# Patient Record
Sex: Female | Born: 1950
Health system: Southern US, Community
[De-identification: ages and names within clinical notes are randomized; demographics above are authoritative.]

## PROBLEM LIST (undated history)

## (undated) DIAGNOSIS — Z862 Personal history of diseases of the blood and blood-forming organs and certain disorders involving the immune mechanism: Secondary | ICD-10-CM

## (undated) DIAGNOSIS — M51369 Other intervertebral disc degeneration, lumbar region without mention of lumbar back pain or lower extremity pain: Secondary | ICD-10-CM

## (undated) DIAGNOSIS — H6690 Otitis media, unspecified, unspecified ear: Secondary | ICD-10-CM

## (undated) DIAGNOSIS — I10 Essential (primary) hypertension: Secondary | ICD-10-CM

## (undated) DIAGNOSIS — G629 Polyneuropathy, unspecified: Secondary | ICD-10-CM

## (undated) DIAGNOSIS — D509 Iron deficiency anemia, unspecified: Secondary | ICD-10-CM

## (undated) DIAGNOSIS — I671 Cerebral aneurysm, nonruptured: Secondary | ICD-10-CM

## (undated) DIAGNOSIS — A630 Anogenital (venereal) warts: Secondary | ICD-10-CM

## (undated) DIAGNOSIS — A6000 Herpesviral infection of urogenital system, unspecified: Secondary | ICD-10-CM

## (undated) DIAGNOSIS — G4700 Insomnia, unspecified: Secondary | ICD-10-CM

## (undated) DIAGNOSIS — M1990 Unspecified osteoarthritis, unspecified site: Secondary | ICD-10-CM

## (undated) DIAGNOSIS — E782 Mixed hyperlipidemia: Secondary | ICD-10-CM

## (undated) DIAGNOSIS — K219 Gastro-esophageal reflux disease without esophagitis: Secondary | ICD-10-CM

## (undated) DIAGNOSIS — M5136 Other intervertebral disc degeneration, lumbar region: Secondary | ICD-10-CM

## (undated) DIAGNOSIS — Z87448 Personal history of other diseases of urinary system: Secondary | ICD-10-CM

## (undated) DIAGNOSIS — I639 Cerebral infarction, unspecified: Secondary | ICD-10-CM

## (undated) DIAGNOSIS — E785 Hyperlipidemia, unspecified: Secondary | ICD-10-CM

## (undated) DIAGNOSIS — G20A1 Parkinson's disease without dyskinesia, without mention of fluctuations: Secondary | ICD-10-CM

## (undated) DIAGNOSIS — I509 Heart failure, unspecified: Secondary | ICD-10-CM

## (undated) DIAGNOSIS — D649 Anemia, unspecified: Secondary | ICD-10-CM

## (undated) DIAGNOSIS — C50919 Malignant neoplasm of unspecified site of unspecified female breast: Secondary | ICD-10-CM

## (undated) DIAGNOSIS — G20 Parkinson's disease: Secondary | ICD-10-CM

## (undated) DIAGNOSIS — J329 Chronic sinusitis, unspecified: Secondary | ICD-10-CM

## (undated) DIAGNOSIS — G250 Essential tremor: Secondary | ICD-10-CM

## (undated) HISTORY — PX: CHOLECYSTECTOMY: SHX55

## (undated) HISTORY — DX: Personal history of diseases of the blood and blood-forming organs and certain disorders involving the immune mechanism: Z86.2

## (undated) HISTORY — DX: Hyperlipidemia, unspecified: E78.5

## (undated) HISTORY — DX: Insomnia, unspecified: G47.00

## (undated) HISTORY — DX: Heart failure, unspecified: I50.9

## (undated) HISTORY — DX: Other intervertebral disc degeneration, lumbar region without mention of lumbar back pain or lower extremity pain: M51.369

## (undated) HISTORY — DX: Mixed hyperlipidemia: E78.2

## (undated) HISTORY — DX: Anogenital (venereal) warts: A63.0

## (undated) HISTORY — DX: Essential tremor: G25.0

## (undated) HISTORY — DX: Unspecified osteoarthritis, unspecified site: M19.90

## (undated) HISTORY — DX: Malignant neoplasm of unspecified site of unspecified female breast: C50.919

## (undated) HISTORY — DX: Polyneuropathy, unspecified: G62.9

## (undated) HISTORY — DX: Parkinson's disease without dyskinesia, without mention of fluctuations: G20.A1

## (undated) HISTORY — DX: Personal history of other diseases of urinary system: Z87.448

## (undated) HISTORY — DX: Cerebral aneurysm, nonruptured: I67.1

## (undated) HISTORY — DX: Cerebral infarction, unspecified: I63.9

## (undated) HISTORY — DX: Other intervertebral disc degeneration, lumbar region: M51.36

## (undated) HISTORY — DX: Iron deficiency anemia, unspecified: D50.9

## (undated) HISTORY — DX: Essential (primary) hypertension: I10

## (undated) HISTORY — DX: Parkinson's disease: G20

---
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
## 1968-01-22 DIAGNOSIS — D649 Anemia, unspecified: Secondary | ICD-10-CM

## 1968-01-22 HISTORY — PX: TONSILLECTOMY: SUR1361

## 1968-01-22 HISTORY — DX: Anemia, unspecified: D64.9

## 2004-10-25 ENCOUNTER — Ambulatory Visit: Payer: Self-pay | Admitting: Cardiology

## 2005-09-19 ENCOUNTER — Ambulatory Visit (HOSPITAL_COMMUNITY): Admission: RE | Admit: 2005-09-19 | Discharge: 2005-09-19 | Payer: Self-pay | Admitting: Family Medicine

## 2007-02-12 ENCOUNTER — Ambulatory Visit (HOSPITAL_COMMUNITY): Admission: RE | Admit: 2007-02-12 | Discharge: 2007-02-12 | Payer: Self-pay | Admitting: Family Medicine

## 2008-03-14 ENCOUNTER — Ambulatory Visit (HOSPITAL_COMMUNITY): Admission: RE | Admit: 2008-03-14 | Discharge: 2008-03-14 | Payer: Self-pay | Admitting: Family Medicine

## 2008-03-14 IMAGING — MG MM DIGITAL SCREENING
4 series · 4 of 4 positions shown · non-contrast
Comparison: none

DG SCREEN MAMMOGRAM BILATERAL
Bilateral CC and MLO view(s) were taken.

DIGITAL SCREENING MAMMOGRAM WITH CAD:
There are scattered fibroglandular densities.  No masses or malignant type calcifications are 
identified.  Compared with prior studies.

[L CC]
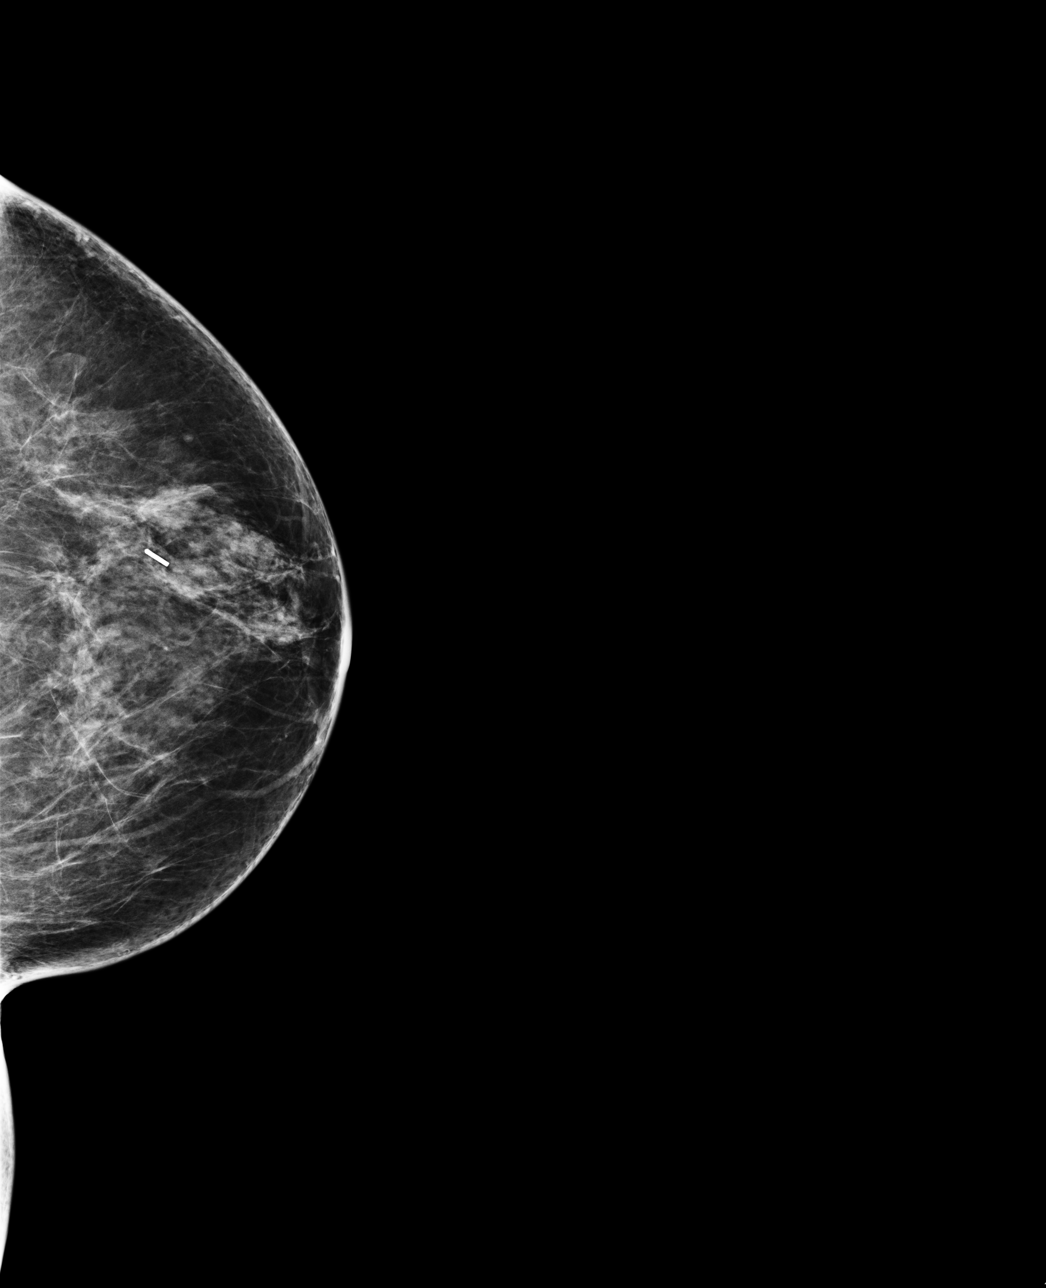

[L MLO]
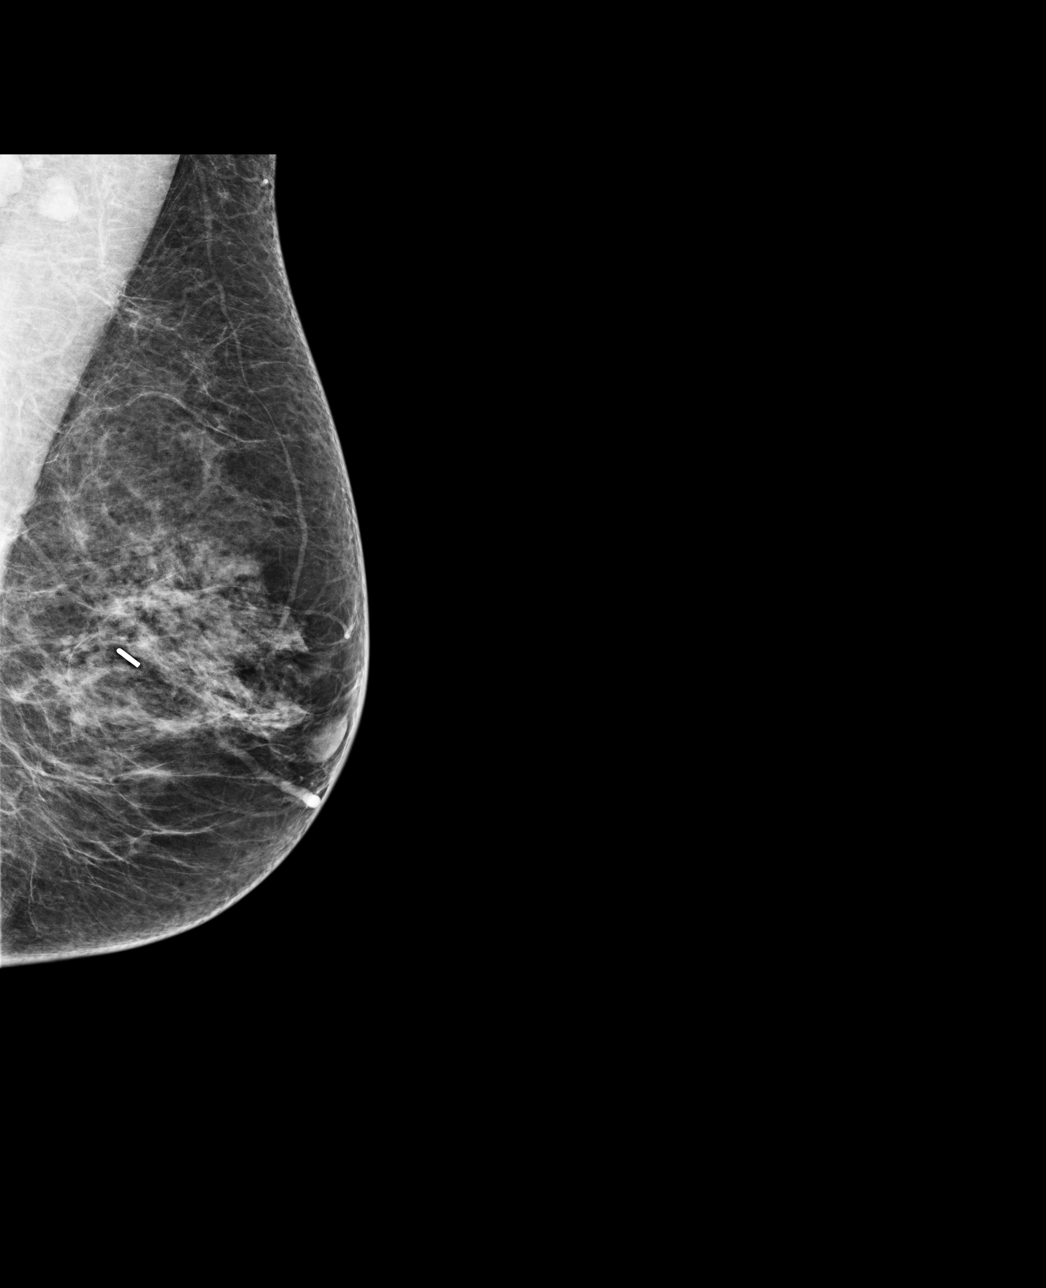

[R CC]
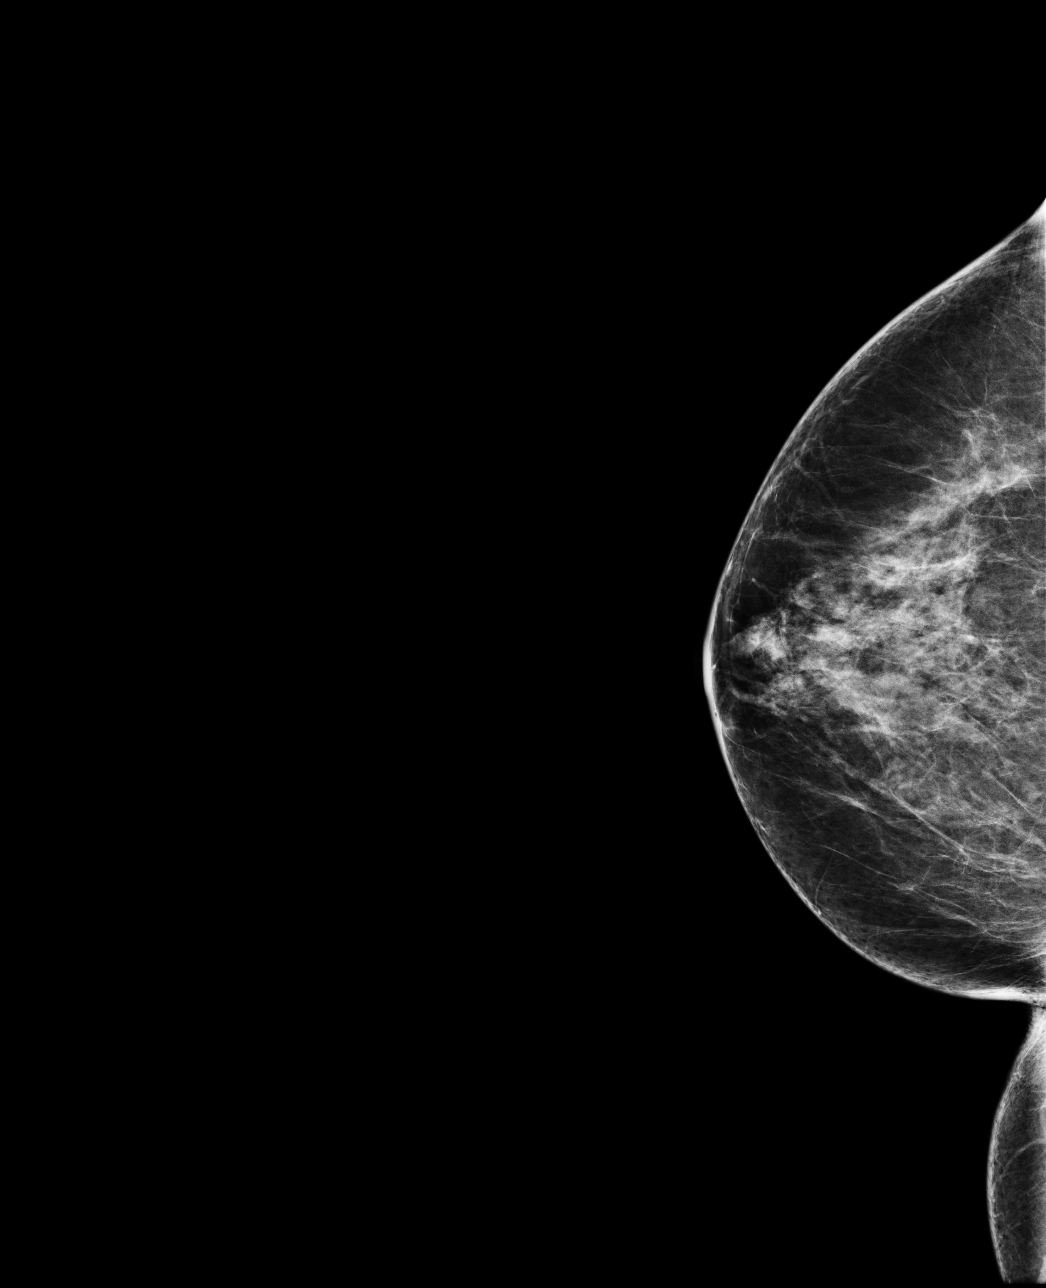

[R MLO]
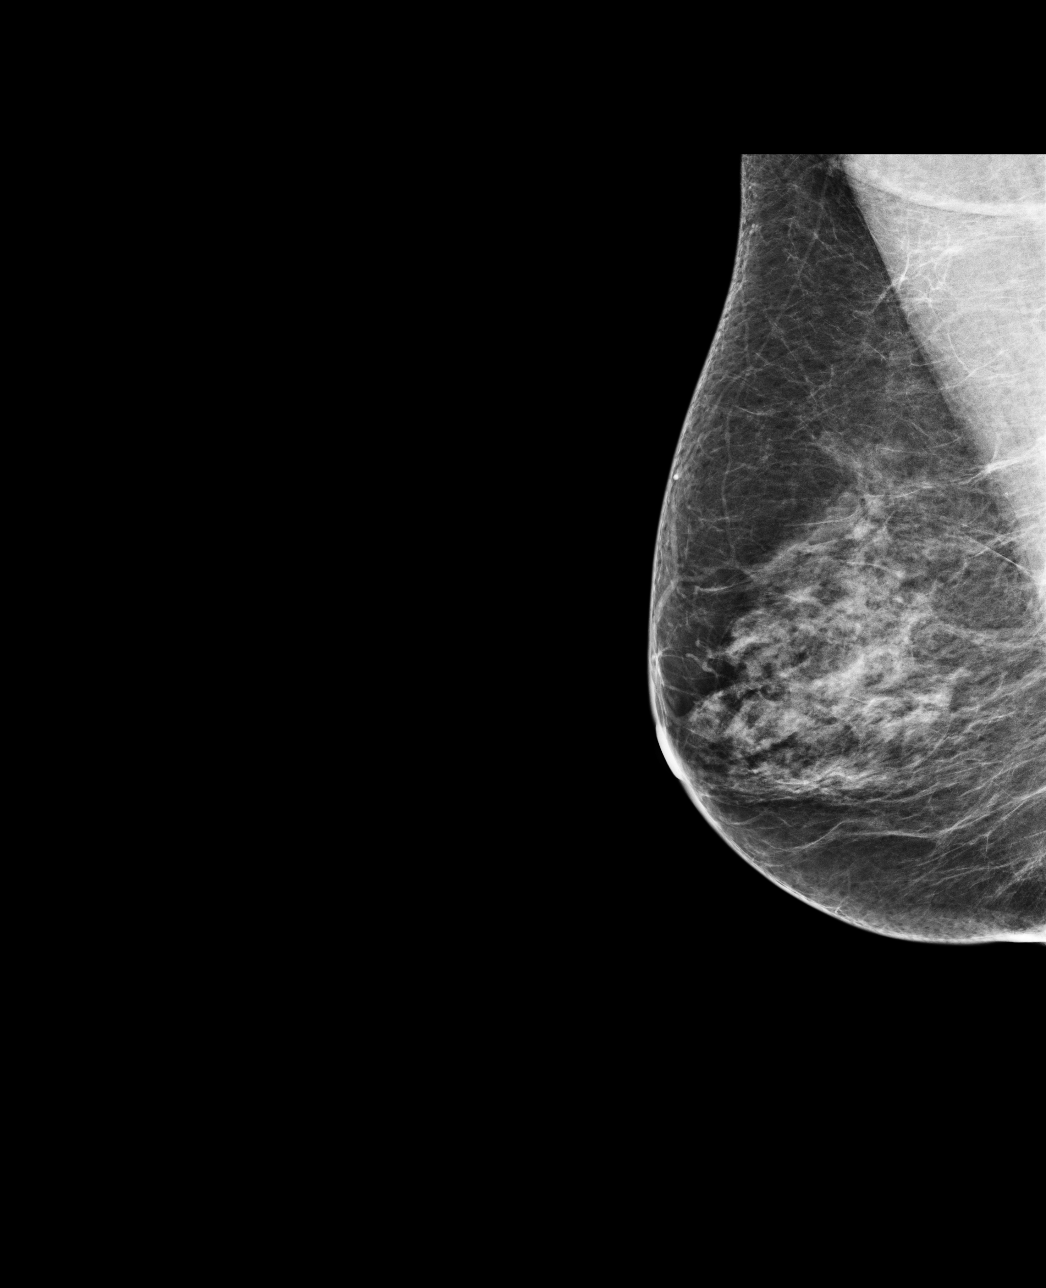

[4 of 4 positions shown; findings below may reference images not displayed]

IMPRESSION: No specific mammographic evidence of malignancy.  Next screening mammogram is recommended in one 
year.

ASSESSMENT: Negative - BI-RADS 1

Screening mammogram in 1 year.
THIS WAS ANALAYZED BY COMPUTER AIDED DETECTION. , THIS PROCEDURE WAS A DIGITAL MAMMOGRAM.

## 2013-04-12 DIAGNOSIS — M79609 Pain in unspecified limb: Secondary | ICD-10-CM | POA: Diagnosis not present

## 2013-04-12 DIAGNOSIS — M2010 Hallux valgus (acquired), unspecified foot: Secondary | ICD-10-CM | POA: Diagnosis not present

## 2013-04-12 DIAGNOSIS — M2020 Hallux rigidus, unspecified foot: Secondary | ICD-10-CM | POA: Diagnosis not present

## 2013-07-03 DIAGNOSIS — R109 Unspecified abdominal pain: Secondary | ICD-10-CM | POA: Diagnosis not present

## 2013-07-03 DIAGNOSIS — N200 Calculus of kidney: Secondary | ICD-10-CM | POA: Diagnosis not present

## 2013-07-21 DIAGNOSIS — H538 Other visual disturbances: Secondary | ICD-10-CM | POA: Diagnosis not present

## 2013-07-21 DIAGNOSIS — H25019 Cortical age-related cataract, unspecified eye: Secondary | ICD-10-CM | POA: Diagnosis not present

## 2013-07-21 DIAGNOSIS — H2510 Age-related nuclear cataract, unspecified eye: Secondary | ICD-10-CM | POA: Diagnosis not present

## 2013-08-02 DIAGNOSIS — D573 Sickle-cell trait: Secondary | ICD-10-CM | POA: Diagnosis not present

## 2013-08-02 DIAGNOSIS — H5210 Myopia, unspecified eye: Secondary | ICD-10-CM | POA: Diagnosis not present

## 2013-08-02 DIAGNOSIS — H25019 Cortical age-related cataract, unspecified eye: Secondary | ICD-10-CM | POA: Diagnosis not present

## 2013-08-02 DIAGNOSIS — K219 Gastro-esophageal reflux disease without esophagitis: Secondary | ICD-10-CM | POA: Diagnosis not present

## 2013-08-02 DIAGNOSIS — H269 Unspecified cataract: Secondary | ICD-10-CM | POA: Diagnosis not present

## 2013-08-02 DIAGNOSIS — I10 Essential (primary) hypertension: Secondary | ICD-10-CM | POA: Diagnosis not present

## 2013-08-02 DIAGNOSIS — Z880 Allergy status to penicillin: Secondary | ICD-10-CM | POA: Diagnosis not present

## 2013-08-02 DIAGNOSIS — H524 Presbyopia: Secondary | ICD-10-CM | POA: Diagnosis not present

## 2013-08-02 DIAGNOSIS — H538 Other visual disturbances: Secondary | ICD-10-CM | POA: Diagnosis not present

## 2013-08-02 DIAGNOSIS — H2510 Age-related nuclear cataract, unspecified eye: Secondary | ICD-10-CM | POA: Diagnosis not present

## 2013-08-02 DIAGNOSIS — Z79899 Other long term (current) drug therapy: Secondary | ICD-10-CM | POA: Diagnosis not present

## 2013-08-02 DIAGNOSIS — H52209 Unspecified astigmatism, unspecified eye: Secondary | ICD-10-CM | POA: Diagnosis not present

## 2013-08-13 DIAGNOSIS — E782 Mixed hyperlipidemia: Secondary | ICD-10-CM | POA: Diagnosis not present

## 2013-08-17 DIAGNOSIS — D509 Iron deficiency anemia, unspecified: Secondary | ICD-10-CM | POA: Diagnosis not present

## 2013-08-17 DIAGNOSIS — Z0000 Encounter for general adult medical examination without abnormal findings: Secondary | ICD-10-CM | POA: Diagnosis not present

## 2013-08-17 DIAGNOSIS — I10 Essential (primary) hypertension: Secondary | ICD-10-CM | POA: Diagnosis not present

## 2013-08-17 DIAGNOSIS — Z1331 Encounter for screening for depression: Secondary | ICD-10-CM | POA: Diagnosis not present

## 2013-08-17 DIAGNOSIS — K219 Gastro-esophageal reflux disease without esophagitis: Secondary | ICD-10-CM | POA: Diagnosis not present

## 2013-08-17 DIAGNOSIS — J309 Allergic rhinitis, unspecified: Secondary | ICD-10-CM | POA: Diagnosis not present

## 2013-08-17 DIAGNOSIS — E559 Vitamin D deficiency, unspecified: Secondary | ICD-10-CM | POA: Diagnosis not present

## 2013-08-17 DIAGNOSIS — E782 Mixed hyperlipidemia: Secondary | ICD-10-CM | POA: Diagnosis not present

## 2013-08-26 DIAGNOSIS — R300 Dysuria: Secondary | ICD-10-CM | POA: Diagnosis not present

## 2013-10-28 DIAGNOSIS — M7062 Trochanteric bursitis, left hip: Secondary | ICD-10-CM | POA: Diagnosis not present

## 2013-10-28 DIAGNOSIS — A6000 Herpesviral infection of urogenital system, unspecified: Secondary | ICD-10-CM | POA: Diagnosis not present

## 2013-10-28 DIAGNOSIS — I10 Essential (primary) hypertension: Secondary | ICD-10-CM | POA: Diagnosis not present

## 2013-10-28 DIAGNOSIS — J302 Other seasonal allergic rhinitis: Secondary | ICD-10-CM | POA: Diagnosis not present

## 2013-10-28 DIAGNOSIS — E559 Vitamin D deficiency, unspecified: Secondary | ICD-10-CM | POA: Diagnosis not present

## 2013-10-28 DIAGNOSIS — E782 Mixed hyperlipidemia: Secondary | ICD-10-CM | POA: Diagnosis not present

## 2013-10-28 DIAGNOSIS — K649 Unspecified hemorrhoids: Secondary | ICD-10-CM | POA: Diagnosis not present

## 2013-10-28 DIAGNOSIS — D509 Iron deficiency anemia, unspecified: Secondary | ICD-10-CM | POA: Diagnosis not present

## 2013-12-31 DIAGNOSIS — J019 Acute sinusitis, unspecified: Secondary | ICD-10-CM | POA: Diagnosis not present

## 2013-12-31 DIAGNOSIS — I10 Essential (primary) hypertension: Secondary | ICD-10-CM | POA: Diagnosis not present

## 2014-01-03 NOTE — Patient Instructions (Signed)
Your procedure is scheduled on: 01/10/2014  Report to [facility] at  1000  AM.  Call this number if you have problems the morning of surgery: [phone]   Do not eat food or drink liquids :After Midnight.      Take these medicines the morning of surgery with A SIP OF WATER: none   Do not wear jewelry, make-up or nail polish.  Do not wear lotions, powders, or perfumes.   Do not shave 48 hours prior to surgery.  Do not bring valuables to the hospital.  Contacts, dentures or bridgework may not be worn into surgery.  Leave suitcase in the car. After surgery it may be brought to your room.  For patients admitted to the hospital, checkout time is 11:00 AM the day of discharge.   Patients discharged the day of surgery will not be allowed to drive home.  :     Please read over the following fact sheets that you were given: Coughing and Deep Breathing, Surgical Site Infection Prevention, Anesthesia Post-op Instructions and Care and Recovery After Surgery    Cataract A cataract is a clouding of the lens of the eye. When a lens becomes cloudy, vision is reduced based on the degree and nature of the clouding. Many cataracts reduce vision to some degree. Some cataracts make people more near-sighted as they develop. Other cataracts increase glare. Cataracts that are ignored and become worse can sometimes look white. The white color can be seen through the pupil. CAUSES   Aging. However, cataracts may occur at any age, even in newborns.   Certain drugs.   Trauma to the eye.   Certain diseases such as diabetes.   Specific eye diseases such as chronic inflammation inside the eye or a sudden attack of a rare form of glaucoma.   Inherited or acquired medical problems.  SYMPTOMS   Gradual, progressive drop in vision in the affected eye.   Severe, rapid visual loss. This most often happens when trauma is the cause.  DIAGNOSIS  To detect a cataract, an eye doctor examines the lens. Cataracts are  best diagnosed with an exam of the eyes with the pupils enlarged (dilated) by drops.  TREATMENT  For an early cataract, vision may improve by using different eyeglasses or stronger lighting. If that does not help your vision, surgery is the only effective treatment. A cataract needs to be surgically removed when vision loss interferes with your everyday activities, such as driving, reading, or watching TV. A cataract may also have to be removed if it prevents examination or treatment of another eye problem. Surgery removes the cloudy lens and usually replaces it with a substitute lens (intraocular lens, IOL).  At a time when both you and your doctor agree, the cataract will be surgically removed. If you have cataracts in both eyes, only one is usually removed at a time. This allows the operated eye to heal and be out of danger from any possible problems after surgery (such as infection or poor wound healing). In rare cases, a cataract may be doing damage to your eye. In these cases, your caregiver may advise surgical removal right away. The vast majority of people who have cataract surgery have better vision afterward. HOME CARE INSTRUCTIONS  If you are not planning surgery, you may be asked to do the following:  Use different eyeglasses.   Use stronger or brighter lighting.   Ask your eye doctor about reducing your medicine dose or changing medicines if  it is thought that a medicine caused your cataract. Changing medicines does not make the cataract go away on its own.   Become familiar with your surroundings. Poor vision can lead to injury. Avoid bumping into things on the affected side. You are at a higher risk for tripping or falling.   Exercise extreme care when driving or operating machinery.   Wear sunglasses if you are sensitive to bright light or experiencing problems with glare.  SEEK IMMEDIATE MEDICAL CARE IF:   You have a worsening or sudden vision loss.   You notice redness,  swelling, or increasing pain in the eye.   You have a fever.  Document Released: 01/07/2005 Document Revised: 12/27/2010 Document Reviewed: 08/31/2010 [facility] Patient Information 2012 [place].PATIENT INSTRUCTIONS POST-ANESTHESIA  IMMEDIATELY FOLLOWING SURGERY:  Do not drive or operate machinery for the first twenty four hours after surgery.  Do not make any important decisions for twenty four hours after surgery or while taking narcotic pain medications or sedatives.  If you develop intractable nausea and vomiting or a severe headache please notify your doctor immediately.  FOLLOW-UP:  Please make an appointment with your surgeon as instructed. You do not need to follow up with anesthesia unless specifically instructed to do so.  WOUND CARE INSTRUCTIONS (if applicable):  Keep a dry clean dressing on the anesthesia/puncture wound site if there is drainage.  Once the wound has quit draining you may leave it open to air.  Generally you should leave the bandage intact for twenty four hours unless there is drainage.  If the epidural site drains for more than 36-48 hours please call the anesthesia department.  QUESTIONS?:  Please feel free to call your physician or the hospital operator if you have any questions, and they will be happy to assist you.

## 2014-01-04 ENCOUNTER — Encounter (HOSPITAL_COMMUNITY): Payer: Self-pay

## 2014-01-04 ENCOUNTER — Encounter (HOSPITAL_COMMUNITY)
Admission: RE | Admit: 2014-01-04 | Discharge: 2014-01-04 | Disposition: A | Discharge status: Home / Self Care | Payer: Managed Care, Other (non HMO) | Source: Ambulatory Visit | Attending: Ophthalmology | Admitting: Ophthalmology

## 2014-01-04 ENCOUNTER — Other Ambulatory Visit: Payer: Self-pay

## 2014-01-04 DIAGNOSIS — Z01818 Encounter for other preprocedural examination: Secondary | ICD-10-CM | POA: Diagnosis not present

## 2014-01-04 DIAGNOSIS — J329 Chronic sinusitis, unspecified: Secondary | ICD-10-CM

## 2014-01-04 HISTORY — DX: Herpesviral infection of urogenital system, unspecified: A60.00

## 2014-01-04 HISTORY — DX: Chronic sinusitis, unspecified: J32.9

## 2014-01-04 HISTORY — DX: Gastro-esophageal reflux disease without esophagitis: K21.9

## 2014-01-04 LAB — BASIC METABOLIC PANEL
ANION GAP: 13 (ref 5–15)
BUN: 10 mg/dL (ref 6–23)
CHLORIDE: 104 meq/L (ref 96–112)
CO2: 27 meq/L (ref 19–32)
Calcium: 9.3 mg/dL (ref 8.4–10.5)
Creatinine, Ser: 1.04 mg/dL (ref 0.50–1.10)
GFR calc Af Amer: 64 mL/min — ABNORMAL LOW (ref >90)
GFR calc non Af Amer: 55 mL/min — ABNORMAL LOW (ref >90)
Glucose, Bld: 95 mg/dL (ref 70–99)
POTASSIUM: 4.2 meq/L (ref 3.7–5.3)
SODIUM: 144 meq/L (ref 137–147)

## 2014-01-04 LAB — HEMOGLOBIN AND HEMATOCRIT, BLOOD
HEMATOCRIT: 34.1 % — AB (ref 36.0–46.0)
Hemoglobin: 11.5 g/dL — ABNORMAL LOW (ref 12.0–15.0)

## 2014-01-10 ENCOUNTER — Encounter (HOSPITAL_COMMUNITY)
Admission: RE | Disposition: A | Discharge status: Home / Self Care | Payer: Self-pay | Source: Ambulatory Visit | Attending: Ophthalmology

## 2014-01-10 ENCOUNTER — Ambulatory Visit (HOSPITAL_COMMUNITY)
Admission: RE | Admit: 2014-01-10 | Discharge: 2014-01-10 | Disposition: A | Discharge status: Home / Self Care | Payer: Private Health Insurance - Indemnity | Source: Ambulatory Visit | Attending: Ophthalmology | Admitting: Ophthalmology

## 2014-01-10 ENCOUNTER — Ambulatory Visit (HOSPITAL_COMMUNITY): Payer: Private Health Insurance - Indemnity | Admitting: Anesthesiology

## 2014-01-10 DIAGNOSIS — K219 Gastro-esophageal reflux disease without esophagitis: Secondary | ICD-10-CM | POA: Diagnosis not present

## 2014-01-10 DIAGNOSIS — H2512 Age-related nuclear cataract, left eye: Secondary | ICD-10-CM | POA: Diagnosis not present

## 2014-01-10 HISTORY — PX: CATARACT EXTRACTION W/PHACO: SHX586

## 2014-01-10 SURGERY — PHACOEMULSIFICATION, CATARACT, WITH IOL INSERTION
Anesthesia: Monitor Anesthesia Care | Site: Eye | Laterality: Left

## 2014-01-10 MED ORDER — LIDOCAINE HCL 3.5 % OP GEL
OPHTHALMIC | Status: AC
  Filled 2014-01-10: qty 1

## 2014-01-10 MED ORDER — LIDOCAINE HCL 3.5 % OP GEL
OPHTHALMIC | Status: DC | PRN
  Administered 2014-01-10: 1 via OPHTHALMIC

## 2014-01-10 MED ORDER — MIDAZOLAM HCL 2 MG/2ML IJ SOLN
1.0000 mg | INTRAMUSCULAR | Status: DC | PRN
  Administered 2014-01-10: 2 mg via INTRAVENOUS

## 2014-01-10 MED ORDER — FENTANYL CITRATE 0.05 MG/ML IJ SOLN
25.0000 ug | INTRAMUSCULAR | Status: AC
  Administered 2014-01-10 (×2): 25 ug via INTRAVENOUS

## 2014-01-10 MED ORDER — LACTATED RINGERS IV SOLN
INTRAVENOUS | Status: DC
  Administered 2014-01-10: 10:00:00 via INTRAVENOUS

## 2014-01-10 MED ORDER — TETRACAINE HCL 0.5 % OP SOLN
OPHTHALMIC | Status: AC
  Filled 2014-01-10: qty 2

## 2014-01-10 MED ORDER — BSS IO SOLN
INTRAOCULAR | Status: DC | PRN
  Administered 2014-01-10: 15 mL via INTRAOCULAR

## 2014-01-10 MED ORDER — MIDAZOLAM HCL 2 MG/2ML IJ SOLN
INTRAMUSCULAR | Status: AC
  Filled 2014-01-10: qty 2

## 2014-01-10 MED ORDER — EPINEPHRINE HCL 1 MG/ML IJ SOLN
INTRAOCULAR | Status: DC | PRN
  Administered 2014-01-10: 500 mL

## 2014-01-10 MED ORDER — TETRACAINE 0.5 % OP SOLN OPTIME - NO CHARGE
OPHTHALMIC | Status: DC | PRN
  Administered 2014-01-10: 1 [drp] via OPHTHALMIC

## 2014-01-10 MED ORDER — FENTANYL CITRATE 0.05 MG/ML IJ SOLN
INTRAMUSCULAR | Status: AC
  Filled 2014-01-10: qty 2

## 2014-01-10 MED ORDER — POVIDONE-IODINE 5 % OP SOLN
OPHTHALMIC | Status: DC | PRN
  Administered 2014-01-10: 1 via OPHTHALMIC

## 2014-01-10 MED ORDER — NA HYALUR & NA CHOND-NA HYALUR 0.55-0.5 ML IO KIT
PACK | INTRAOCULAR | Status: DC | PRN
  Administered 2014-01-10: 1 via OPHTHALMIC

## 2014-01-10 MED ORDER — CYCLOPENTOLATE-PHENYLEPHRINE OP SOLN OPTIME - NO CHARGE
OPHTHALMIC | Status: AC
  Filled 2014-01-10: qty 2

## 2014-01-10 MED ORDER — CYCLOPENTOLATE-PHENYLEPHRINE 0.2-1 % OP SOLN
1.0000 [drp] | OPHTHALMIC | Status: AC
  Administered 2014-01-10 (×3): 1 [drp] via OPHTHALMIC

## 2014-01-10 MED ORDER — TETRACAINE HCL 0.5 % OP SOLN
1.0000 [drp] | OPHTHALMIC | Status: AC
Start: 2014-01-10 — End: 2014-01-10
  Administered 2014-01-10 (×3): 1 [drp] via OPHTHALMIC

## 2014-01-10 MED ORDER — EPINEPHRINE HCL 1 MG/ML IJ SOLN
INTRAMUSCULAR | Status: AC
  Filled 2014-01-10: qty 1

## 2014-01-10 MED ORDER — LIDOCAINE HCL 3.5 % OP GEL: 1.0000 "application " | Freq: Once | OPHTHALMIC | Status: DC

## 2014-01-10 SURGICAL SUPPLY — 28 items
CAPSULAR TENSION RING-AMO (OPHTHALMIC RELATED) IMPLANT
CLOTH BEACON ORANGE TIMEOUT ST (SAFETY) ×2 IMPLANT
GLOVE BIO SURGEON STRL SZ7.5 (GLOVE) IMPLANT
GLOVE BIOGEL M 6.5 STRL (GLOVE) IMPLANT
GLOVE BIOGEL PI IND STRL 6.5 (GLOVE) IMPLANT
GLOVE BIOGEL PI IND STRL 7.0 (GLOVE) IMPLANT
GLOVE BIOGEL PI INDICATOR 6.5 (GLOVE) ×2
GLOVE BIOGEL PI INDICATOR 7.0 (GLOVE)
GLOVE ECLIPSE 6.5 STRL STRAW (GLOVE) IMPLANT
GLOVE ECLIPSE 7.5 STRL STRAW (GLOVE) IMPLANT
GLOVE EXAM NITRILE LRG STRL (GLOVE) IMPLANT
GLOVE EXAM NITRILE MD LF STRL (GLOVE) ×2 IMPLANT
GLOVE SKINSENSE NS SZ6.5 (GLOVE)
GLOVE SKINSENSE NS SZ7.0 (GLOVE)
GLOVE SKINSENSE STRL SZ6.5 (GLOVE) IMPLANT
GLOVE SKINSENSE STRL SZ7.0 (GLOVE) IMPLANT
INST SET CATARACT ~~LOC~~ (KITS) ×3 IMPLANT
KIT VITRECTOMY (OPHTHALMIC RELATED) IMPLANT
PAD ARMBOARD 7.5X6 YLW CONV (MISCELLANEOUS) ×2 IMPLANT
PROC W NO LENS (INTRAOCULAR LENS)
PROC W SPEC LENS (INTRAOCULAR LENS)
PROCESS W NO LENS (INTRAOCULAR LENS) IMPLANT
PROCESS W SPEC LENS (INTRAOCULAR LENS) IMPLANT
RETRACTOR IRIS SIGHTPATH (OPHTHALMIC RELATED) IMPLANT
RING MALYGIN (MISCELLANEOUS) IMPLANT
SIGHTPATH CAT PROC W REG LENS (Ophthalmic Related) ×3 IMPLANT
VISCOELASTIC ADDITIONAL (OPHTHALMIC RELATED) IMPLANT
WATER STERILE IRR 250ML POUR (IV SOLUTION) ×2 IMPLANT

## 2014-01-10 NOTE — Discharge Instructions (Signed)
BRILYNN BIASI 01/10/2014 Dr. Iona Hansen Post operative Instructions for Cataract Patients  These instructions are for Kimberly Mccormick and pertain to the operative eye.  1.  Resume your normal diet and previous oral medicines.  2. Your Follow-up appointment is at Dr. Iona Hansen' office in [place] on 01/11/14 at 2:10 pm.  3. You may leave the hospital when your driver is present and your nurse releases you.  4. Begin Pred Forte (prednisolone acetate 1%), Acular LS (ketorolac tromethamine .4%) and Gatifloxacin 0.5% eye drops; 1 drop each 4 times daily to operative eye. Begin 3 hours after discharge from Short Stay Unit.  Moxifloxacin 0.5% may be substituted for Gatifloxacin using the same instructions.  67. Page Dr. Iona Hansen via beeper [phone] for significant pain in or around operative eye that is not relieved by Tylenol.  6. If you took Plavix before surgery, restart it at the usual dose on the evening of surgery.  7. Wear dark glasses as necessary for excessive light sensitivity.  8. Do no forcefully rub you your operative eye.  9. Keep your operative eye dry for 1 week. You may gently clean your eyelids with a damp washcloth.  10. You may resume normal occupational activities in one week and resume driving as tolerated after the first post operative visit.  11. It is normal to have blurred vision and a scratchy sensation following surgery.  Dr. Iona Hansen: [phone]

## 2014-01-10 NOTE — Op Note (Signed)
See scanned op note 

## 2014-01-10 NOTE — Anesthesia Postprocedure Evaluation (Signed)
  Anesthesia Post-op Note  Patient: Kimberly Mccormick  Procedure(s) Performed: Procedure(s): CATARACT EXTRACTION PHACO AND INTRAOCULAR LENS PLACEMENT ; CDE:  4.94 (Left)  Patient Location: Short Stay  Anesthesia Type:MAC  Level of Consciousness: awake, alert , oriented and patient cooperative  Airway and Oxygen Therapy: Patient Spontanous Breathing  Post-op Pain: none  Post-op Assessment: Post-op Vital signs reviewed, Patient's Cardiovascular Status Stable, Respiratory Function Stable and Patent Airway  Post-op Vital Signs: Reviewed and stable  Last Vitals:  Filed Vitals:   01/10/14 1045  BP: 124/76  Pulse:   Temp:   Resp: 28    Complications: No apparent anesthesia complications

## 2014-01-10 NOTE — Brief Op Note (Signed)
01/10/2014  11:33 AM  PATIENT:  Kimberly Mccormick  63 y.o. female  PRE-OPERATIVE DIAGNOSIS:  nuclear cataract left eye  POST-OPERATIVE DIAGNOSIS:  nuclear cataract left eye  PROCEDURE:  Procedure(s): CATARACT EXTRACTION PHACO AND INTRAOCULAR LENS PLACEMENT ; CDE:  4.94  SURGEON:  Surgeon(s): Williams Che, MD  ASSISTANTS: Bonney Roussel, CST   ANESTHESIA STAFF: Anesthesiologist: Lerry Liner, MD CRNA: Charmaine Downs, CRNA  ANESTHESIA:   topical and MAC  REQUESTED LENS POWER: 21.5  LENS IMPLANT INFORMATION:  [employer] [id number] s/n [id number]  Exp 06/2018  CUMULATIVE DISSIPATED ENERGY:4.94  INDICATIONS:see office H&P  OP FINDINGS:mod. dense NS  COMPLICATIONS:None  DICTATION #: none  PLAN OF CARE: as above  PATIENT DISPOSITION:  Short Stay

## 2014-01-10 NOTE — Transfer of Care (Signed)
Immediate Anesthesia Transfer of Care Note  Patient: Kimberly Mccormick  Procedure(s) Performed: Procedure(s): CATARACT EXTRACTION PHACO AND INTRAOCULAR LENS PLACEMENT ; CDE:  4.94 (Left)  Patient Location: Short Stay  Anesthesia Type:MAC  Level of Consciousness: awake, alert , oriented and patient cooperative  Airway & Oxygen Therapy: Patient Spontanous Breathing  Post-op Assessment: Report given to PACU RN, Post -op Vital signs reviewed and stable and Patient moving all extremities  Post vital signs: Reviewed and stable  Complications: No apparent anesthesia complications

## 2014-01-10 NOTE — Anesthesia Preprocedure Evaluation (Signed)
Anesthesia Evaluation  Patient identified by MRN, date of birth, ID band Patient awake    Reviewed: Allergy & Precautions, H&P , NPO status , Patient's Chart, lab work & pertinent test results  Airway Mallampati: I  TM Distance: >3 FB     Dental  (+) Edentulous Upper, Edentulous Lower   Pulmonary neg pulmonary ROS,  breath sounds clear to auscultation        Cardiovascular negative cardio ROS  Rhythm:Regular Rate:Normal     Neuro/Psych    GI/Hepatic GERD-  ,  Endo/Other    Renal/GU      Musculoskeletal   Abdominal   Peds  Hematology   Anesthesia Other Findings   Reproductive/Obstetrics                             Anesthesia Physical Anesthesia Plan  ASA: II  Anesthesia Plan: MAC   Post-op Pain Management:    Induction: Intravenous  Airway Management Planned: Nasal Cannula  Additional Equipment:   Intra-op Plan:   Post-operative Plan:   Informed Consent: I have reviewed the patients History and Physical, chart, labs and discussed the procedure including the risks, benefits and alternatives for the proposed anesthesia with the patient or authorized representative who has indicated his/her understanding and acceptance.     Plan Discussed with:   Anesthesia Plan Comments:         Anesthesia Quick Evaluation

## 2014-01-10 NOTE — H&P (Signed)
I have reviewed the pre printed H&P, the patient was re-examined, and I have identified no significant interval changes in the patient's medical condition.  There is no change in the plan of care since the history and physical of record. 

## 2014-01-11 ENCOUNTER — Encounter (HOSPITAL_COMMUNITY): Payer: Self-pay | Admitting: Ophthalmology

## 2014-01-21 HISTORY — PX: NASAL SINUS SURGERY: SHX719

## 2014-01-31 DIAGNOSIS — Z1231 Encounter for screening mammogram for malignant neoplasm of breast: Secondary | ICD-10-CM | POA: Diagnosis not present

## 2014-02-05 DIAGNOSIS — E559 Vitamin D deficiency, unspecified: Secondary | ICD-10-CM | POA: Diagnosis not present

## 2014-02-05 DIAGNOSIS — E782 Mixed hyperlipidemia: Secondary | ICD-10-CM | POA: Diagnosis not present

## 2014-02-05 DIAGNOSIS — I10 Essential (primary) hypertension: Secondary | ICD-10-CM | POA: Diagnosis not present

## 2014-02-09 DIAGNOSIS — M1991 Primary osteoarthritis, unspecified site: Secondary | ICD-10-CM | POA: Diagnosis not present

## 2014-02-09 DIAGNOSIS — A6000 Herpesviral infection of urogenital system, unspecified: Secondary | ICD-10-CM | POA: Diagnosis not present

## 2014-02-09 DIAGNOSIS — K649 Unspecified hemorrhoids: Secondary | ICD-10-CM | POA: Diagnosis not present

## 2014-02-09 DIAGNOSIS — I10 Essential (primary) hypertension: Secondary | ICD-10-CM | POA: Diagnosis not present

## 2014-02-09 DIAGNOSIS — J302 Other seasonal allergic rhinitis: Secondary | ICD-10-CM | POA: Diagnosis not present

## 2014-02-09 DIAGNOSIS — J329 Chronic sinusitis, unspecified: Secondary | ICD-10-CM | POA: Diagnosis not present

## 2014-02-09 DIAGNOSIS — M7062 Trochanteric bursitis, left hip: Secondary | ICD-10-CM | POA: Diagnosis not present

## 2014-02-09 DIAGNOSIS — E559 Vitamin D deficiency, unspecified: Secondary | ICD-10-CM | POA: Diagnosis not present

## 2014-02-09 DIAGNOSIS — E782 Mixed hyperlipidemia: Secondary | ICD-10-CM | POA: Diagnosis not present

## 2014-02-09 DIAGNOSIS — Z6821 Body mass index (BMI) 21.0-21.9, adult: Secondary | ICD-10-CM | POA: Diagnosis not present

## 2014-02-09 DIAGNOSIS — J330 Polyp of nasal cavity: Secondary | ICD-10-CM | POA: Diagnosis not present

## 2014-02-09 DIAGNOSIS — K219 Gastro-esophageal reflux disease without esophagitis: Secondary | ICD-10-CM | POA: Diagnosis not present

## 2014-03-01 DIAGNOSIS — J339 Nasal polyp, unspecified: Secondary | ICD-10-CM | POA: Diagnosis not present

## 2014-03-01 DIAGNOSIS — J329 Chronic sinusitis, unspecified: Secondary | ICD-10-CM | POA: Diagnosis not present

## 2014-03-01 DIAGNOSIS — H6690 Otitis media, unspecified, unspecified ear: Secondary | ICD-10-CM | POA: Diagnosis not present

## 2014-03-04 DIAGNOSIS — J3489 Other specified disorders of nose and nasal sinuses: Secondary | ICD-10-CM | POA: Diagnosis not present

## 2014-03-04 DIAGNOSIS — J339 Nasal polyp, unspecified: Secondary | ICD-10-CM | POA: Diagnosis not present

## 2014-03-04 DIAGNOSIS — R938 Abnormal findings on diagnostic imaging of other specified body structures: Secondary | ICD-10-CM | POA: Diagnosis not present

## 2014-04-05 DIAGNOSIS — H6690 Otitis media, unspecified, unspecified ear: Secondary | ICD-10-CM | POA: Diagnosis not present

## 2014-04-05 DIAGNOSIS — J329 Chronic sinusitis, unspecified: Secondary | ICD-10-CM | POA: Diagnosis not present

## 2014-04-05 DIAGNOSIS — J339 Nasal polyp, unspecified: Secondary | ICD-10-CM | POA: Diagnosis not present

## 2014-04-26 DIAGNOSIS — Z79899 Other long term (current) drug therapy: Secondary | ICD-10-CM | POA: Diagnosis not present

## 2014-04-26 DIAGNOSIS — K219 Gastro-esophageal reflux disease without esophagitis: Secondary | ICD-10-CM | POA: Diagnosis not present

## 2014-04-26 DIAGNOSIS — J329 Chronic sinusitis, unspecified: Secondary | ICD-10-CM | POA: Diagnosis not present

## 2014-04-26 DIAGNOSIS — J328 Other chronic sinusitis: Secondary | ICD-10-CM | POA: Diagnosis not present

## 2014-04-26 DIAGNOSIS — H6593 Unspecified nonsuppurative otitis media, bilateral: Secondary | ICD-10-CM | POA: Diagnosis not present

## 2014-04-26 DIAGNOSIS — D573 Sickle-cell trait: Secondary | ICD-10-CM | POA: Diagnosis not present

## 2014-04-26 DIAGNOSIS — H900 Conductive hearing loss, bilateral: Secondary | ICD-10-CM | POA: Diagnosis not present

## 2014-04-26 DIAGNOSIS — J339 Nasal polyp, unspecified: Secondary | ICD-10-CM | POA: Diagnosis not present

## 2014-04-26 DIAGNOSIS — Z880 Allergy status to penicillin: Secondary | ICD-10-CM | POA: Diagnosis not present

## 2014-04-26 DIAGNOSIS — I10 Essential (primary) hypertension: Secondary | ICD-10-CM | POA: Diagnosis not present

## 2014-05-03 DIAGNOSIS — H906 Mixed conductive and sensorineural hearing loss, bilateral: Secondary | ICD-10-CM | POA: Diagnosis not present

## 2014-05-03 DIAGNOSIS — J339 Nasal polyp, unspecified: Secondary | ICD-10-CM | POA: Diagnosis not present

## 2014-05-31 DIAGNOSIS — J339 Nasal polyp, unspecified: Secondary | ICD-10-CM | POA: Diagnosis not present

## 2014-05-31 DIAGNOSIS — H906 Mixed conductive and sensorineural hearing loss, bilateral: Secondary | ICD-10-CM | POA: Diagnosis not present

## 2014-06-14 DIAGNOSIS — Z6822 Body mass index (BMI) 22.0-22.9, adult: Secondary | ICD-10-CM | POA: Diagnosis not present

## 2014-06-14 DIAGNOSIS — L03114 Cellulitis of left upper limb: Secondary | ICD-10-CM | POA: Diagnosis not present

## 2014-06-17 DIAGNOSIS — Z79899 Other long term (current) drug therapy: Secondary | ICD-10-CM | POA: Diagnosis not present

## 2014-06-17 DIAGNOSIS — I10 Essential (primary) hypertension: Secondary | ICD-10-CM | POA: Diagnosis not present

## 2014-06-17 DIAGNOSIS — K219 Gastro-esophageal reflux disease without esophagitis: Secondary | ICD-10-CM | POA: Diagnosis not present

## 2014-06-17 DIAGNOSIS — R079 Chest pain, unspecified: Secondary | ICD-10-CM | POA: Diagnosis not present

## 2014-06-17 DIAGNOSIS — Z79891 Long term (current) use of opiate analgesic: Secondary | ICD-10-CM | POA: Diagnosis not present

## 2014-06-17 DIAGNOSIS — Z880 Allergy status to penicillin: Secondary | ICD-10-CM | POA: Diagnosis not present

## 2014-06-17 DIAGNOSIS — E785 Hyperlipidemia, unspecified: Secondary | ICD-10-CM | POA: Diagnosis not present

## 2014-06-23 DIAGNOSIS — R1084 Generalized abdominal pain: Secondary | ICD-10-CM | POA: Diagnosis not present

## 2014-06-24 ENCOUNTER — Encounter (HOSPITAL_COMMUNITY): Payer: Self-pay | Admitting: *Deleted

## 2014-06-24 ENCOUNTER — Emergency Department (HOSPITAL_COMMUNITY)
Admission: EM | Admit: 2014-06-24 | Discharge: 2014-06-25 | Disposition: A | Discharge status: Home / Self Care | Payer: BLUE CROSS/BLUE SHIELD | Attending: Emergency Medicine | Admitting: Emergency Medicine

## 2014-06-24 ENCOUNTER — Emergency Department (HOSPITAL_COMMUNITY): Payer: BLUE CROSS/BLUE SHIELD

## 2014-06-24 DIAGNOSIS — R1012 Left upper quadrant pain: Secondary | ICD-10-CM | POA: Diagnosis not present

## 2014-06-24 DIAGNOSIS — R110 Nausea: Secondary | ICD-10-CM | POA: Insufficient documentation

## 2014-06-24 DIAGNOSIS — R1032 Left lower quadrant pain: Secondary | ICD-10-CM | POA: Diagnosis not present

## 2014-06-24 DIAGNOSIS — M5489 Other dorsalgia: Secondary | ICD-10-CM

## 2014-06-24 DIAGNOSIS — M549 Dorsalgia, unspecified: Secondary | ICD-10-CM | POA: Diagnosis not present

## 2014-06-24 DIAGNOSIS — Z8619 Personal history of other infectious and parasitic diseases: Secondary | ICD-10-CM | POA: Insufficient documentation

## 2014-06-24 DIAGNOSIS — K219 Gastro-esophageal reflux disease without esophagitis: Secondary | ICD-10-CM | POA: Diagnosis not present

## 2014-06-24 DIAGNOSIS — Z880 Allergy status to penicillin: Secondary | ICD-10-CM | POA: Insufficient documentation

## 2014-06-24 DIAGNOSIS — Z8709 Personal history of other diseases of the respiratory system: Secondary | ICD-10-CM | POA: Diagnosis not present

## 2014-06-24 DIAGNOSIS — Z79899 Other long term (current) drug therapy: Secondary | ICD-10-CM | POA: Insufficient documentation

## 2014-06-24 DIAGNOSIS — R109 Unspecified abdominal pain: Secondary | ICD-10-CM

## 2014-06-24 LAB — CBC WITH DIFFERENTIAL/PLATELET
Basophils Absolute: 0 10*3/uL (ref 0.0–0.1)
Basophils Relative: 0 % (ref 0–1)
EOS ABS: 0.6 10*3/uL (ref 0.0–0.7)
Eosinophils Relative: 8 % — ABNORMAL HIGH (ref 0–5)
HEMATOCRIT: 37.9 % (ref 36.0–46.0)
HEMOGLOBIN: 12.6 g/dL (ref 12.0–15.0)
Lymphocytes Relative: 56 % — ABNORMAL HIGH (ref 12–46)
Lymphs Abs: 4.2 10*3/uL — ABNORMAL HIGH (ref 0.7–4.0)
MCH: 26.7 pg (ref 26.0–34.0)
MCHC: 33.2 g/dL (ref 30.0–36.0)
MCV: 80.3 fL (ref 78.0–100.0)
MONOS PCT: 7 % (ref 3–12)
Monocytes Absolute: 0.5 10*3/uL (ref 0.1–1.0)
Neutro Abs: 2.1 10*3/uL (ref 1.7–7.7)
Neutrophils Relative %: 29 % — ABNORMAL LOW (ref 43–77)
PLATELETS: 264 10*3/uL (ref 150–400)
RBC: 4.72 MIL/uL (ref 3.87–5.11)
RDW: 12.6 % (ref 11.5–15.5)
WBC: 7.4 10*3/uL (ref 4.0–10.5)

## 2014-06-24 LAB — COMPREHENSIVE METABOLIC PANEL
ALK PHOS: 160 U/L — AB (ref 38–126)
ALT: 38 U/L (ref 14–54)
ANION GAP: 5 (ref 5–15)
AST: 96 U/L — AB (ref 15–41)
Albumin: 4.4 g/dL (ref 3.5–5.0)
BUN: 13 mg/dL (ref 6–20)
CALCIUM: 8.9 mg/dL (ref 8.9–10.3)
CHLORIDE: 107 mmol/L (ref 101–111)
CO2: 27 mmol/L (ref 22–32)
Creatinine, Ser: 0.92 mg/dL (ref 0.44–1.00)
GFR calc Af Amer: >60 mL/min (ref >60)
GFR calc non Af Amer: >60 mL/min (ref >60)
Glucose, Bld: 102 mg/dL — ABNORMAL HIGH (ref 65–99)
Potassium: 3.4 mmol/L — ABNORMAL LOW (ref 3.5–5.1)
Sodium: 139 mmol/L (ref 135–145)
TOTAL PROTEIN: 8.1 g/dL (ref 6.5–8.1)
Total Bilirubin: 1.3 mg/dL — ABNORMAL HIGH (ref 0.3–1.2)

## 2014-06-24 LAB — URINALYSIS, ROUTINE W REFLEX MICROSCOPIC
Bilirubin Urine: NEGATIVE
GLUCOSE, UA: NEGATIVE mg/dL
Hgb urine dipstick: NEGATIVE
Ketones, ur: NEGATIVE mg/dL
LEUKOCYTES UA: NEGATIVE
Nitrite: NEGATIVE
PH: 6 (ref 5.0–8.0)
PROTEIN: NEGATIVE mg/dL
Specific Gravity, Urine: 1.015 (ref 1.005–1.030)
Urobilinogen, UA: 0.2 mg/dL (ref 0.0–1.0)

## 2014-06-24 LAB — TROPONIN I

## 2014-06-24 MED ORDER — MORPHINE SULFATE 4 MG/ML IJ SOLN
4.0000 mg | Freq: Once | INTRAMUSCULAR | Status: AC
  Administered 2014-06-24: 4 mg via INTRAVENOUS
  Filled 2014-06-24: qty 1

## 2014-06-24 MED ORDER — SODIUM CHLORIDE 0.9 % IV SOLN
1000.0000 mL | Freq: Once | INTRAVENOUS | Status: AC
  Administered 2014-06-24: 1000 mL via INTRAVENOUS

## 2014-06-24 MED ORDER — IOHEXOL 350 MG/ML SOLN: 100.0000 mL | Freq: Once | INTRAVENOUS | Status: AC | PRN

## 2014-06-24 MED ORDER — SODIUM CHLORIDE 0.9 % IV BOLUS (SEPSIS): 1000.0000 mL | Freq: Once | INTRAVENOUS | Status: DC

## 2014-06-24 MED ORDER — SODIUM CHLORIDE 0.9 % IJ SOLN
INTRAMUSCULAR | Status: AC
  Filled 2014-06-24: qty 45

## 2014-06-24 MED ORDER — FENTANYL CITRATE (PF) 100 MCG/2ML IJ SOLN
50.0000 ug | Freq: Once | INTRAMUSCULAR | Status: AC
Start: 2014-06-24 — End: 2014-06-24
  Administered 2014-06-24: 50 ug via INTRAVENOUS
  Filled 2014-06-24: qty 2

## 2014-06-24 MED ORDER — ONDANSETRON HCL 4 MG/2ML IJ SOLN
4.0000 mg | Freq: Once | INTRAMUSCULAR | Status: AC
  Administered 2014-06-24: 4 mg via INTRAVENOUS
  Filled 2014-06-24: qty 2

## 2014-06-24 NOTE — ED Notes (Signed)
Patient states chest "pressure" earlier with burning in central chest. Patient also states back pain that radiates around flank into lower abdomin. Patient states she was seen at [facility] recently and was given NTG for the pain she was having.

## 2014-06-24 NOTE — ED Notes (Addendum)
Pt reporting pain in lower abdomen, lower back and left flank.  Pt did receive ASA by EMS and reports some nausea after that.  Pt reports that she was seen at [facility] last week for similar symptoms.

## 2014-06-24 NOTE — ED Provider Notes (Signed)
CSN: [id number]     Arrival date & time 06/24/14  2106 History  This chart was scribed for Kimberly Morrison, MD by Peyton Bottoms, ED Scribe. This patient was seen in room [facility] and the patient's care was started at 10:39 PM.   Chief Complaint  Patient presents with  . Abdominal Pain   Patient is a 64 y.o. female presenting with abdominal pain. The history is provided by the patient. No language interpreter was used.  Abdominal Pain Pain location:  LUQ Pain quality: aching and pressure   Pain radiates to:  Does not radiate Pain severity:  Moderate Onset quality:  Sudden Associated symptoms: vomiting   Associated symptoms: no chest pain, no chills, no diarrhea, no dysuria, no fever and no shortness of breath    HPI Comments: Kimberly Mccormick is a 64 y.o. female with a PMHx of GERD, cholecystectomy, appendectomy, and genital herpes, who presents to the Emergency Department complaining of sudden onset of tightness to  LUQ abdomen "under left breast", back pain and increase in BP while patient was sitting earlier today. Per son, patient had similar episode yesterday, with onset of a more severe episode today. She reports associated nausea, but denies episodes of vomiting PTA. She denies associated dysuria, frequency, blood in stool or chest pain. Patient denies use of alcohol or smoking. She denies hx of heart problems. Patient had 1 episode of vomiting while at bedside.   Past Medical History  Diagnosis Date  . GERD (gastroesophageal reflux disease)   . Sinus infection 01/04/14  . Genital herpes    Past Surgical History  Procedure Laterality Date  . Cholecystectomy    . Appendectomy    . Tonsillectomy    . Nasal sinus surgery    . Cesarean section    . Cataract extraction w/phaco Left 01/10/2014    Procedure: CATARACT EXTRACTION PHACO AND INTRAOCULAR LENS PLACEMENT ; CDE:  4.94;  Surgeon: Williams Che, MD;  Location: [facility];  Service: Ophthalmology;  Laterality: Left;   History  reviewed. No pertinent family history. History  Substance Use Topics  . Smoking status: Never Smoker   . Smokeless tobacco: Not on file  . Alcohol Use: No   OB History    No data available     Review of Systems  Constitutional: Positive for appetite change. Negative for fever and chills.  HENT: Negative for congestion.   Eyes: Negative for visual disturbance.  Respiratory: Negative for shortness of breath.   Cardiovascular: Negative for chest pain.  Gastrointestinal: Positive for vomiting and abdominal pain. Negative for diarrhea.  Genitourinary: Negative for dysuria, frequency and flank pain.  Musculoskeletal: Positive for back pain. Negative for neck pain and neck stiffness.  Skin: Negative for rash.  Neurological: Negative for light-headedness and headaches.  All other systems reviewed and are negative.  Allergies  Penicillins  Home Medications   Prior to Admission medications   Medication Sig Start Date End Date Taking? Authorizing Provider  acyclovir (ZOVIRAX) 400 MG tablet Take 400 mg by mouth 2 (two) times daily.   Yes Historical Provider, MD  AMLODIPINE BESYLATE PO Take 1 tablet by mouth daily.   Yes Historical Provider, MD  Cholecalciferol (VITAMIN D) 2000 UNITS tablet Take 2,000 Units by mouth daily.   Yes Historical Provider, MD  HYDROcodone-acetaminophen (NORCO/VICODIN) 5-325 MG per tablet Take 1 tablet by mouth daily as needed for moderate pain.   Yes Historical Provider, MD  Misc Natural Products (CHOLESTEROL RELIEF PO) Take 1 capsule by mouth  3 (three) times daily.   Yes Historical Provider, MD  nitroGLYCERIN (NITROSTAT) 0.4 MG SL tablet Place 0.4 mg under the tongue every 5 (five) minutes as needed for chest pain.   Yes Historical Provider, MD  omeprazole (PRILOSEC) 40 MG capsule Take 40 mg by mouth daily.   Yes Historical Provider, MD   Triage Vitals: BP 185/82 mmHg  Pulse 72  Temp(Src) 98.3 F (36.8 C) (Oral)  Resp 18  Ht 5\' 4"  (1.626 m)  Wt 125 lb (56.7  kg)  BMI 21.45 kg/m2  SpO2 100%  Physical Exam  Constitutional: She is oriented to person, place, and time. She appears well-developed and well-nourished. No distress.  HENT:  Head: Normocephalic and atraumatic.  Eyes: Conjunctivae and EOM are normal. Pupils are equal, round, and reactive to light. No scleral icterus.  Neck: Neck supple. No tracheal deviation present.  Cardiovascular: Normal rate, regular rhythm and normal heart sounds.   Pulmonary/Chest: Effort normal. No respiratory distress.  Anterior lung fields are clear  Abdominal:  No focal tenderness at this time. No peritonitis.   Musculoskeletal: Normal range of motion.  Neurological: She is alert and oriented to person, place, and time.  Skin: Skin is warm and dry.  Psychiatric: She has a normal mood and affect. Her behavior is normal.  Nursing note and vitals reviewed.  ED Course  Procedures (including critical care time) Emergency Ultrasound Study:   Angiocath insertion Performed by: Mariea Clonts  Consent: Verbal consent obtained. Risks and benefits: risks, benefits and alternatives were discussed Immediately prior to procedure the correct patient, procedure, equipment, support staff and site/side marked as needed.  Indication: difficult IV access Preparation: Patient was prepped and draped in the usual sterile fashion. Vein Location: left basilic vein was visualized during assessment for potential access sites and was found to be patent/ easily compressed with linear ultrasound.  The needle was visualized with real-time ultrasound and guided into the vein. Gauge: 20 g  Image saved and stored.  Normal blood return.  Patient tolerance: Patient tolerated the procedure well with no immediate complications.     DIAGNOSTIC STUDIES: Oxygen Saturation is 100% on RA, normal by my interpretation.    COORDINATION OF CARE: 10:47 PM- Discussed plans to order diagnostic EKG, CT of abdomen and pelvis, and lab work.  Will give patient IV fluids, morphine, Zofran and Sublimaze. Pt advised of plan for treatment and pt agrees.  Labs Review Labs Reviewed  CBC WITH DIFFERENTIAL/PLATELET - Abnormal; Notable for the following:    Neutrophils Relative % 29 (*)    Lymphocytes Relative 56 (*)    Lymphs Abs 4.2 (*)    Eosinophils Relative 8 (*)    All other components within normal limits  COMPREHENSIVE METABOLIC PANEL - Abnormal; Notable for the following:    Potassium 3.4 (*)    Glucose, Bld 102 (*)    AST 96 (*)    Alkaline Phosphatase 160 (*)    Total Bilirubin 1.3 (*)    All other components within normal limits  URINALYSIS, ROUTINE W REFLEX MICROSCOPIC (NOT AT [facility])  TROPONIN I   Imaging Review No results found.   EKG Interpretation   Date/Time:  Friday June 24 2014 21:37:27 EDT Ventricular Rate:  69 PR Interval:  138 QRS Duration: 80 QT Interval:  467 QTC Calculation: 500 R Axis:   47 Text Interpretation:  Sinus rhythm Prolonged QT interval improvement of  nonspecific ST changes from 12/2013 Confirmed by KOHUT  MD, Hayden ([id number])  on  06/24/2014 9:40:14 PM Also confirmed by Reather Converse  MD, Gwynn Crossley ([id number])  on  06/24/2014 10:43:35 PM     MDM   Final diagnoses:  Abdominal pain   I personally performed the services described in this documentation, which was scribed in my presence. The recorded information has been reviewed and is accurate.  Pt has asa. Patient presents with central and upper abdominal pain with mild back radiation. Patient was seen for similar [facility] recently however this episode is more severe. Patient has no active chest pain or shortness of breath, no cardiac history. Patient has reflux history but this in more severe.  Plan for CT Levada Dy to look for dissection or aneurysm. This may be reflux versus ulcer. May be atypical cardiac plan for troponin and EKG.  Patient's care be signed out to follow-up CT scan results. Difficult IV, ultrasound-guided.  Filed Vitals:    06/24/14 2109 06/24/14 2330  BP: 185/82 158/68  Pulse: 72 61  Temp: 98.3 F (36.8 C)   TempSrc: Oral   Resp: 18 13  Height: 5\' 4"  (1.626 m)   Weight: 125 lb (56.7 kg)   SpO2: 100% 100%     Kimberly Morrison, MD 06/25/14 0010

## 2014-06-25 ENCOUNTER — Emergency Department (HOSPITAL_COMMUNITY): Payer: BLUE CROSS/BLUE SHIELD

## 2014-06-25 DIAGNOSIS — R1012 Left upper quadrant pain: Secondary | ICD-10-CM | POA: Diagnosis not present

## 2014-06-25 LAB — I-STAT TROPONIN, ED: TROPONIN I, POC: 0 ng/mL (ref 0.00–0.08)

## 2014-06-25 LAB — LIPASE, BLOOD: LIPASE: 13 U/L — AB (ref 22–51)

## 2014-06-25 IMAGING — CT CT ANGIO CHEST
2 of 8 series · 15 of 46 positions shown · IV contrast (Omnipaque 300)
Comparison: None.

CLINICAL DATA: Acute onset of left upper quadrant abdominal
tightness and back pain. Elevated blood pressure. Nausea. Initial
encounter.

EXAM:
CT ANGIOGRAPHY CHEST, ABDOMEN AND PELVIS
TECHNIQUE: Multidetector CT imaging through the chest, abdomen and pelvis was
performed using the standard protocol during bolus administration of
intravenous contrast. Multiplanar reconstructed images and MIPs were
obtained and reviewed to evaluate the vascular anatomy.
CONTRAST:  100mL OMNIPAQUE IOHEXOL 350 MG/ML SOLN

[Series 6: dissection 3.0 b40f · axial · 0.63mm/px · z∈[-584,-60]mm · 13 of 197 slices shown]
[im 11/197  lung]
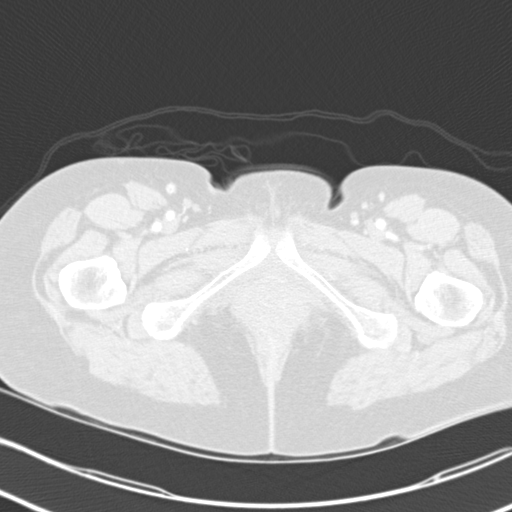
[im 22/197  soft-tissue]
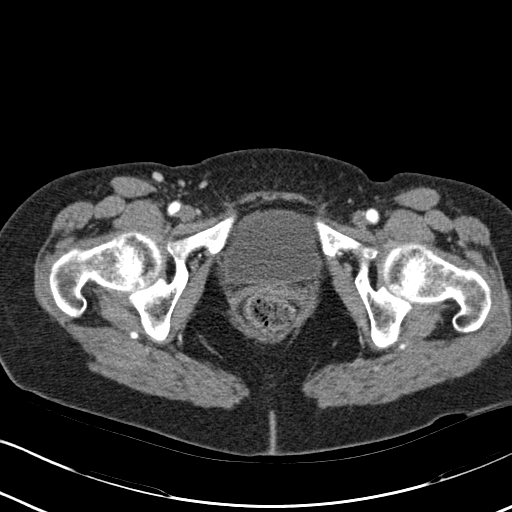
[im 44/197  lung]
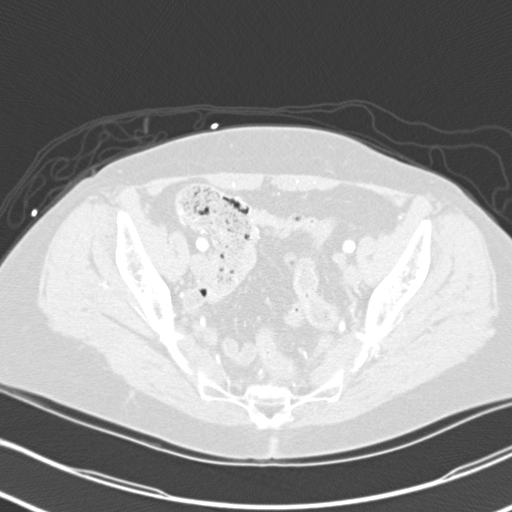
[im 55/197  soft-tissue]
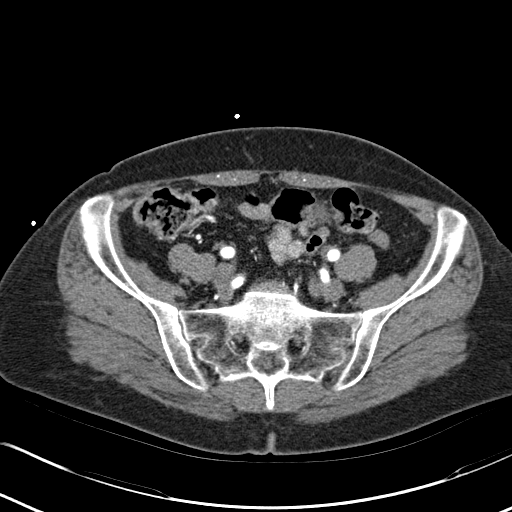
[im 66/197  lung]
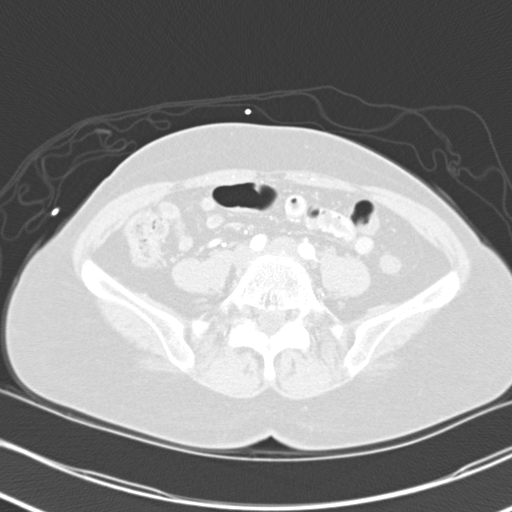
[im 88/197  soft-tissue]
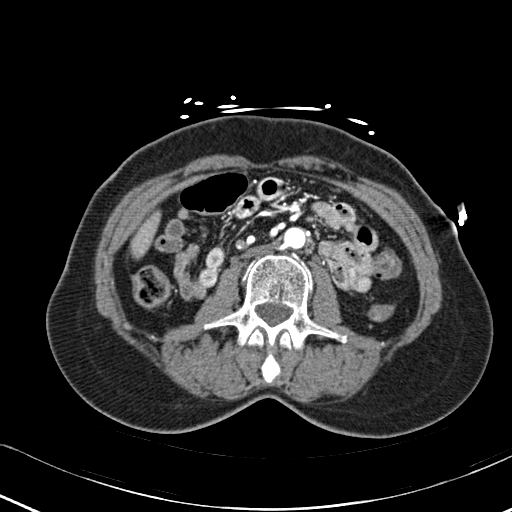
[im 99/197  lung]
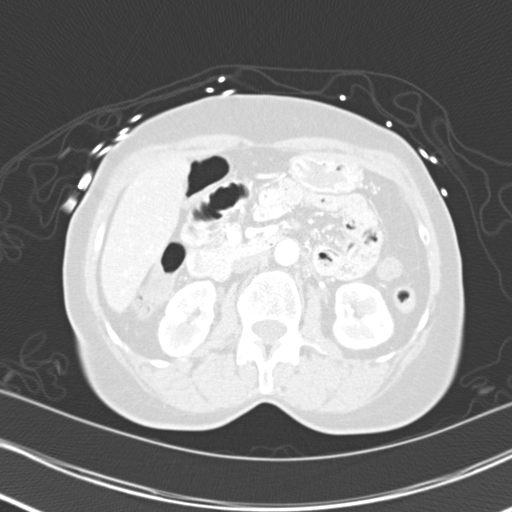
[im 109/197  soft-tissue]
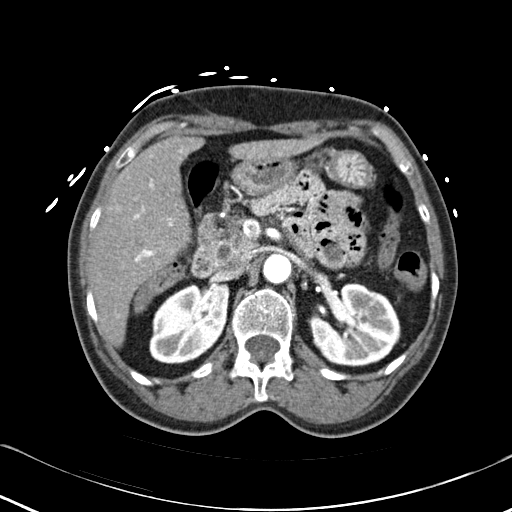
[im 131/197  lung]
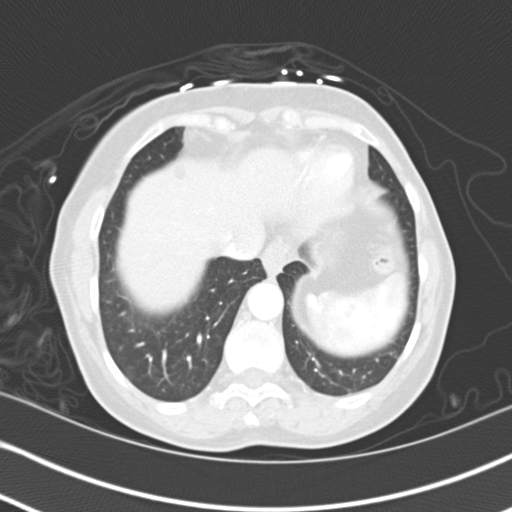
[im 142/197  soft-tissue]
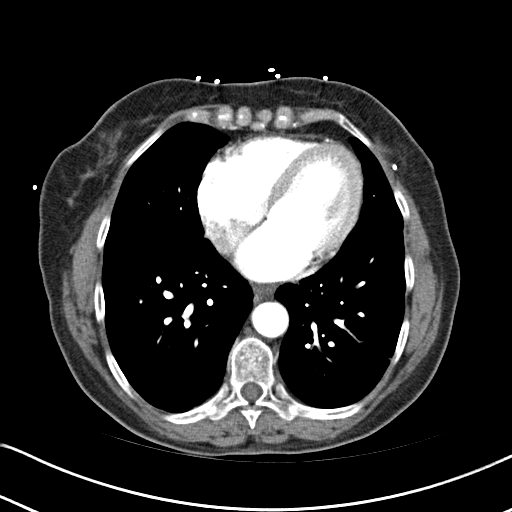
[im 153/197  lung]
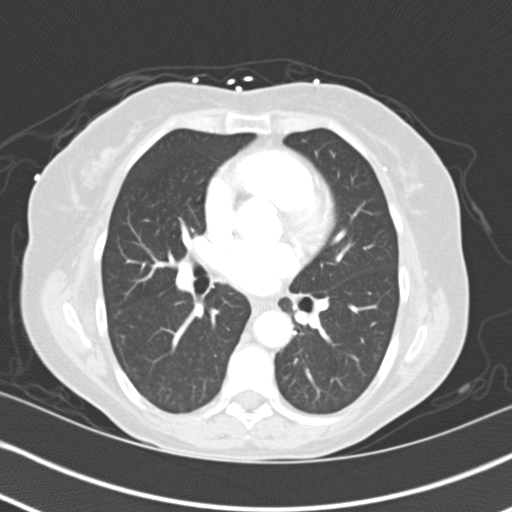
[im 175/197  soft-tissue]
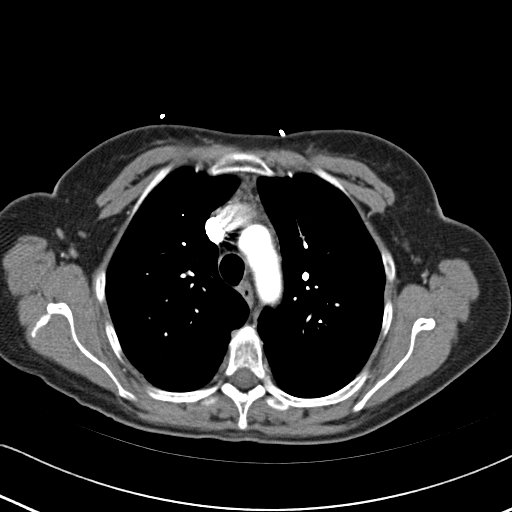
[im 186/197  lung]
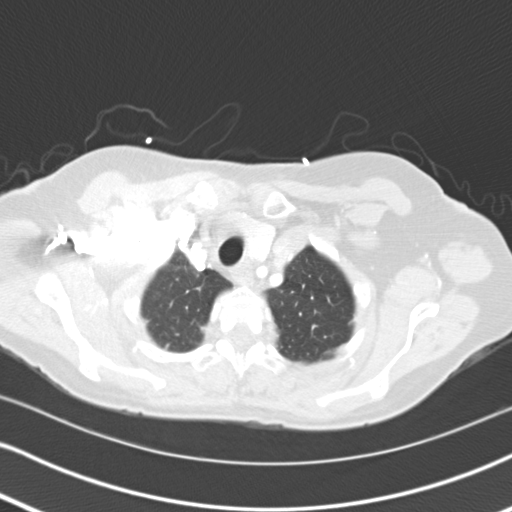

[Series 7: mpr cor post contrast · coronal · 0.52mm/px · 2 of 75 slices shown]
[im 25/75  soft-tissue]
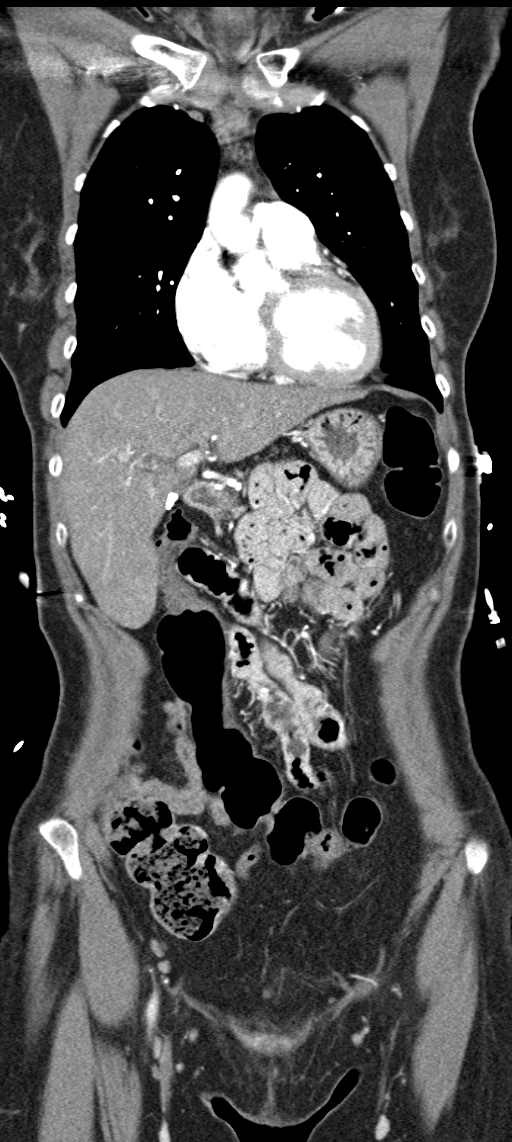
[im 50/75  soft-tissue]
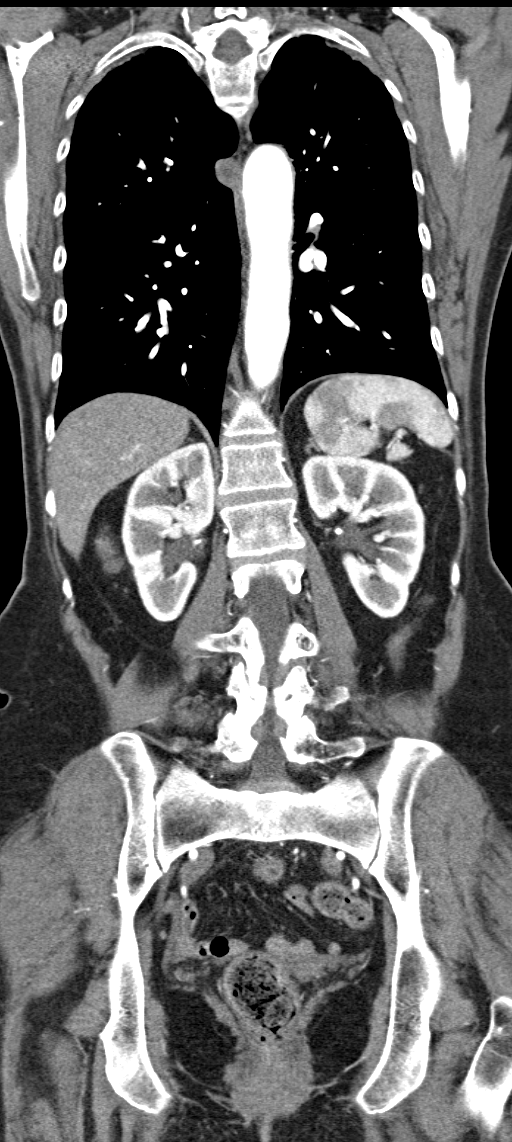

[15 of 46 positions shown; findings below may reference images not displayed]

FINDINGS: CTA CHEST FINDINGS

There is no evidence of aortic dissection. There is no evidence of
aneurysmal dilatation. No calcific atherosclerotic disease is noted
along the thoracic aorta.

There is no evidence of pulmonary embolus.

Minimal bilateral atelectasis or scarring is noted. A 4 mm nodule is
noted at the periphery of the left lower lobe (image 55 of 84). The
lungs are otherwise clear. There is no evidence of pleural effusion
or pneumothorax. No masses are identified; no abnormal focal
contrast enhancement is seen.

The mediastinum is unremarkable in appearance. No mediastinal
lymphadenopathy is seen. No pericardial effusion is identified. The
great vessels are grossly unremarkable. No axillary lymphadenopathy
is seen. The visualized portions of the thyroid gland are
unremarkable in appearance.

No acute osseous abnormalities are seen.

Review of the MIP images confirms the above findings.

CTA ABDOMEN AND PELVIS FINDINGS

There is no evidence of aortic dissection. No aneurysmal dilatation
is seen. No calcific atherosclerotic disease is appreciated. The
celiac trunk, superior mesenteric artery, bilateral renal arteries
and inferior mesenteric artery are unremarkable in appearance.
Incidental note is made of am accessory left renal artery.

Hepatic hypodensities measure up to 1.0 cm in size, likely
reflecting cysts, though nonspecific. The spleen is unremarkable in
appearance. The patient is status post cholecystectomy, with clips
noted at the gallbladder fossa. The pancreas and adrenal glands are
unremarkable.

Scarring is noted at the upper pole of the left kidney. Small
bilateral renal cysts are seen, measuring up to 1.0 cm in size.
There is no evidence of hydronephrosis. No renal or ureteral stones
are seen. No perinephric stranding is appreciated.

No free fluid is identified. The small bowel is unremarkable in
appearance. The stomach is within normal limits. No acute vascular
abnormalities are seen.

The patient is status post appendectomy. The colon is unremarkable
in appearance.

The bladder is mildly distended and grossly remarkable. The uterus
is grossly unremarkable in appearance, aside from a few endometrial
calcifications. The ovaries are grossly symmetric. No suspicious
adnexal masses are seen. No inguinal lymphadenopathy is seen.

No acute osseous abnormalities are identified.

Review of the MIP images confirms the above findings.
IMPRESSION: 1. No evidence of aortic dissection. No evidence of aneurysmal
dilatation. No calcific atherosclerotic disease seen.
2. No evidence of pulmonary embolus.
3. Minimal bilateral atelectasis or scarring noted. 4 mm nodule at
the periphery of the left lower lobe. If the patient is at high risk
for bronchogenic carcinoma, follow-up chest CT at 1 year is
recommended. If the patient is at low risk, no follow-up is needed.
This recommendation follows the consensus statement: Guidelines for
Management of Small Pulmonary Nodules Detected on CT Scans: A
Statement from the [HOSPITAL] as published in Radiology
[0I]; [DATE].
4. Hepatic hypodensities measure up to 1.0 cm in size, likely
reflecting cysts, though nonspecific.
5. Small bilateral renal cysts noted. Scarring at the upper pole of
the left kidney.

## 2014-06-25 MED ORDER — IOHEXOL 350 MG/ML SOLN
100.0000 mL | Freq: Once | INTRAVENOUS | Status: AC | PRN
  Administered 2014-06-25: 100 mL via INTRAVENOUS

## 2014-06-25 MED ORDER — MORPHINE SULFATE 4 MG/ML IJ SOLN
4.0000 mg | Freq: Once | INTRAMUSCULAR | Status: AC
  Administered 2014-06-25: 4 mg via INTRAVENOUS
  Filled 2014-06-25: qty 1

## 2014-06-25 MED ORDER — OXYCODONE-ACETAMINOPHEN 5-325 MG PO TABS
1.0000 | ORAL_TABLET | ORAL | Status: DC | PRN
Start: 2014-06-25 — End: 2014-06-28

## 2014-06-25 NOTE — Discharge Instructions (Signed)
Abdominal Pain, Women °Abdominal (stomach, pelvic, or belly) pain can be caused by many things. It is important to tell your doctor: °· The location of the pain. °· Does it come and go or is it present all the time? °· Are there things that start the pain (eating certain foods, exercise)? °· Are there other symptoms associated with the pain (fever, nausea, vomiting, diarrhea)? °All of this is helpful to know when trying to find the cause of the pain. °CAUSES  °· Stomach: virus or bacteria infection, or ulcer. °· Intestine: appendicitis (inflamed appendix), regional ileitis (Crohn's disease), ulcerative colitis (inflamed colon), irritable bowel syndrome, diverticulitis (inflamed diverticulum of the colon), or cancer of the stomach or intestine. °· Gallbladder disease or stones in the gallbladder. °· Kidney disease, kidney stones, or infection. °· Pancreas infection or cancer. °· Fibromyalgia (pain disorder). °· Diseases of the female organs: °¨ Uterus: fibroid (non-cancerous) tumors or infection. °¨ Fallopian tubes: infection or tubal pregnancy. °¨ Ovary: cysts or tumors. °¨ Pelvic adhesions (scar tissue). °¨ Endometriosis (uterus lining tissue growing in the pelvis and on the pelvic organs). °¨ Pelvic congestion syndrome (female organs filling up with blood just before the menstrual period). °¨ Pain with the menstrual period. °¨ Pain with ovulation (producing an egg). °¨ Pain with an IUD (intrauterine device, birth control) in the uterus. °¨ Cancer of the female organs. °· Functional pain (pain not caused by a disease, may improve without treatment). °· Psychological pain. °· Depression. °DIAGNOSIS  °Your doctor will decide the seriousness of your pain by doing an examination. °· Blood tests. °· X-rays. °· Ultrasound. °· CT scan (computed tomography, special type of X-ray). °· MRI (magnetic resonance imaging). °· Cultures, for infection. °· Barium enema (dye inserted in the large intestine, to better view it with  X-rays). °· Colonoscopy (looking in intestine with a lighted tube). °· Laparoscopy (minor surgery, looking in abdomen with a lighted tube). °· Major abdominal exploratory surgery (looking in abdomen with a large incision). °TREATMENT  °The treatment will depend on the cause of the pain.  °· Many cases can be observed and treated at home. °· Over-the-counter medicines recommended by your caregiver. °· Prescription medicine. °· Antibiotics, for infection. °· Birth control pills, for painful periods or for ovulation pain. °· Hormone treatment, for endometriosis. °· Nerve blocking injections. °· Physical therapy. °· Antidepressants. °· Counseling with a psychologist or psychiatrist. °· Minor or major surgery. °HOME CARE INSTRUCTIONS  °· Do not take laxatives, unless directed by your caregiver. °· Take over-the-counter pain medicine only if ordered by your caregiver. Do not take aspirin because it can cause an upset stomach or bleeding. °· Try a clear liquid diet (broth or water) as ordered by your caregiver. Slowly move to a bland diet, as tolerated, if the pain is related to the stomach or intestine. °· Have a thermometer and take your temperature several times a day, and record it. °· Bed rest and sleep, if it helps the pain. °· Avoid sexual intercourse, if it causes pain. °· Avoid stressful situations. °· Keep your follow-up appointments and tests, as your caregiver orders. °· If the pain does not go away with medicine or surgery, you may try: °¨ Acupuncture. °¨ Relaxation exercises (yoga, meditation). °¨ Group therapy. °¨ Counseling. °SEEK MEDICAL CARE IF:  °· You notice certain foods cause stomach pain. °· Your home care treatment is not helping your pain. °· You need stronger pain medicine. °· You want your IUD removed. °· You feel faint or   lightheaded.  You develop nausea and vomiting.  You develop a rash.  You are having side effects or an allergy to your medicine. SEEK IMMEDIATE MEDICAL CARE IF:   Your  pain does not go away or gets worse.  You have a fever.  Your pain is felt only in portions of the abdomen. The right side could possibly be appendicitis. The left lower portion of the abdomen could be colitis or diverticulitis.  You are passing blood in your stools (bright red or black tarry stools, with or without vomiting).  You have blood in your urine.  You develop chills, with or without a fever.  You pass out. MAKE SURE YOU:   Understand these instructions.  Will watch your condition.  Will get help right away if you are not doing well or get worse. Document Released: 11/04/2006 Document Revised: 05/24/2013 Document Reviewed: 11/24/2008 [facility] Patient Information 2015 [place], [place]. This information is not intended to replace advice given to you by your health care provider. Make sure you discuss any questions you have with your health care provider.  Back Pain, Adult Low back pain is very common. About 1 in 5 people have back pain.The cause of low back pain is rarely dangerous. The pain often gets better over time.About half of people with a sudden onset of back pain feel better in just 2 weeks. About 8 in 10 people feel better by 6 weeks.  CAUSES Some common causes of back pain include:  Strain of the muscles or ligaments supporting the spine.  Wear and tear (degeneration) of the spinal discs.  Arthritis.  Direct injury to the back. DIAGNOSIS Most of the time, the direct cause of low back pain is not known.However, back pain can be treated effectively even when the exact cause of the pain is unknown.Answering your caregiver's questions about your overall health and symptoms is one of the most accurate ways to make sure the cause of your pain is not dangerous. If your caregiver needs more information, he or she may order lab work or imaging tests (X-rays or MRIs).However, even if imaging tests show changes in your back, this usually does not require surgery. HOME  CARE INSTRUCTIONS For many people, back pain returns.Since low back pain is rarely dangerous, it is often a condition that people can learn to [facility] their own.   Remain active. It is stressful on the back to sit or stand in one place. Do not sit, drive, or stand in one place for more than 30 minutes at a time. Take short walks on level surfaces as soon as pain allows.Try to increase the length of time you walk each day.  Do not stay in bed.Resting more than 1 or 2 days can delay your recovery.  Do not avoid exercise or work.Your body is made to move.It is not dangerous to be active, even though your back may hurt.Your back will likely heal faster if you return to being active before your pain is gone.  Pay attention to your body when you bend and lift. Many people have less discomfortwhen lifting if they bend their knees, keep the load close to their bodies,and avoid twisting. Often, the most comfortable positions are those that put less stress on your recovering back.  Find a comfortable position to sleep. Use a firm mattress and lie on your side with your knees slightly bent. If you lie on your back, put a pillow under your knees.  Only take over-the-counter or prescription medicines as  directed by your caregiver. Over-the-counter medicines to reduce pain and inflammation are often the most helpful.Your caregiver may prescribe muscle relaxant drugs.These medicines help dull your pain so you can more quickly return to your normal activities and healthy exercise.  Put ice on the injured area.  Put ice in a plastic bag.  Place a towel between your skin and the bag.  Leave the ice on for 15-20 minutes, 03-04 times a day for the first 2 to 3 days. After that, ice and heat may be alternated to reduce pain and spasms.  Ask your caregiver about trying back exercises and gentle massage. This may be of some benefit.  Avoid feeling anxious or stressed.Stress increases muscle tension  and can worsen back pain.It is important to recognize when you are anxious or stressed and learn ways to manage it.Exercise is a great option. SEEK MEDICAL CARE IF:  You have pain that is not relieved with rest or medicine.  You have pain that does not improve in 1 week.  You have new symptoms.  You are generally not feeling well. SEEK IMMEDIATE MEDICAL CARE IF:   You have pain that radiates from your back into your legs.  You develop new bowel or bladder control problems.  You have unusual weakness or numbness in your arms or legs.  You develop nausea or vomiting.  You develop abdominal pain.  You feel faint. Document Released: 01/07/2005 Document Revised: 07/09/2011 Document Reviewed: 05/11/2013 [facility] Patient Information 2015 [place], [place]. This information is not intended to replace advice given to you by your health care provider. Make sure you discuss any questions you have with your health care provider.

## 2014-06-25 NOTE — ED Notes (Signed)
Patient verbalizes understanding of discharge instructions, prescription medications home care and follow up care. Patient ambulatory out of department at this time with family.

## 2014-06-25 NOTE — ED Notes (Signed)
Called CT pt has 3rd IV, ready for transport to scan

## 2014-06-27 DIAGNOSIS — R079 Chest pain, unspecified: Secondary | ICD-10-CM | POA: Diagnosis not present

## 2014-06-27 DIAGNOSIS — I209 Angina pectoris, unspecified: Secondary | ICD-10-CM | POA: Diagnosis not present

## 2014-06-28 NOTE — ED Notes (Signed)
Patient contacted for follow up call.  Patient says she could not get Rx filled at [facility] in [place] because doctor's "number" expired. Dr. Sabra Heck verified that Dr. Guido Sander "number" had expired, but has been renewed.  Checked with [facility] in [place] verified that their system was still showing Dr. Exie Parody "number" expired 05/31. Prescription written by Dr. Reola Calkins contacted and will pick up. Marita Snellen, RN

## 2014-07-05 DIAGNOSIS — I209 Angina pectoris, unspecified: Secondary | ICD-10-CM | POA: Diagnosis not present

## 2014-07-10 DIAGNOSIS — K219 Gastro-esophageal reflux disease without esophagitis: Secondary | ICD-10-CM | POA: Diagnosis not present

## 2014-07-10 DIAGNOSIS — S60562A Insect bite (nonvenomous) of left hand, initial encounter: Secondary | ICD-10-CM | POA: Diagnosis not present

## 2014-07-10 DIAGNOSIS — Z9049 Acquired absence of other specified parts of digestive tract: Secondary | ICD-10-CM | POA: Diagnosis not present

## 2014-07-10 DIAGNOSIS — I10 Essential (primary) hypertension: Secondary | ICD-10-CM | POA: Diagnosis not present

## 2014-07-10 DIAGNOSIS — Z961 Presence of intraocular lens: Secondary | ICD-10-CM | POA: Diagnosis not present

## 2014-07-10 DIAGNOSIS — L089 Local infection of the skin and subcutaneous tissue, unspecified: Secondary | ICD-10-CM | POA: Diagnosis not present

## 2014-07-10 DIAGNOSIS — Z79899 Other long term (current) drug therapy: Secondary | ICD-10-CM | POA: Diagnosis not present

## 2014-09-20 DIAGNOSIS — I209 Angina pectoris, unspecified: Secondary | ICD-10-CM | POA: Diagnosis not present

## 2014-12-01 DIAGNOSIS — I10 Essential (primary) hypertension: Secondary | ICD-10-CM | POA: Diagnosis not present

## 2014-12-01 DIAGNOSIS — K219 Gastro-esophageal reflux disease without esophagitis: Secondary | ICD-10-CM | POA: Diagnosis not present

## 2014-12-01 DIAGNOSIS — Z79899 Other long term (current) drug therapy: Secondary | ICD-10-CM | POA: Diagnosis not present

## 2014-12-01 DIAGNOSIS — S7001XA Contusion of right hip, initial encounter: Secondary | ICD-10-CM | POA: Diagnosis not present

## 2015-01-11 ENCOUNTER — Emergency Department (HOSPITAL_COMMUNITY): Payer: BLUE CROSS/BLUE SHIELD

## 2015-01-11 ENCOUNTER — Encounter (HOSPITAL_COMMUNITY): Payer: Self-pay | Admitting: *Deleted

## 2015-01-11 ENCOUNTER — Emergency Department (HOSPITAL_COMMUNITY)
Admission: EM | Admit: 2015-01-11 | Discharge: 2015-01-11 | Disposition: A | Discharge status: Home / Self Care | Payer: BLUE CROSS/BLUE SHIELD | Attending: Emergency Medicine | Admitting: Emergency Medicine

## 2015-01-11 DIAGNOSIS — Z8619 Personal history of other infectious and parasitic diseases: Secondary | ICD-10-CM | POA: Diagnosis not present

## 2015-01-11 DIAGNOSIS — R079 Chest pain, unspecified: Secondary | ICD-10-CM | POA: Diagnosis present

## 2015-01-11 DIAGNOSIS — Z79899 Other long term (current) drug therapy: Secondary | ICD-10-CM | POA: Diagnosis not present

## 2015-01-11 DIAGNOSIS — Z880 Allergy status to penicillin: Secondary | ICD-10-CM | POA: Diagnosis not present

## 2015-01-11 DIAGNOSIS — D649 Anemia, unspecified: Secondary | ICD-10-CM | POA: Insufficient documentation

## 2015-01-11 DIAGNOSIS — K219 Gastro-esophageal reflux disease without esophagitis: Secondary | ICD-10-CM | POA: Insufficient documentation

## 2015-01-11 DIAGNOSIS — I10 Essential (primary) hypertension: Secondary | ICD-10-CM | POA: Diagnosis not present

## 2015-01-11 DIAGNOSIS — Z8709 Personal history of other diseases of the respiratory system: Secondary | ICD-10-CM | POA: Insufficient documentation

## 2015-01-11 HISTORY — DX: Essential (primary) hypertension: I10

## 2015-01-11 LAB — CBC
HEMATOCRIT: 34.3 % — AB (ref 36.0–46.0)
Hemoglobin: 11.3 g/dL — ABNORMAL LOW (ref 12.0–15.0)
MCH: 26 pg (ref 26.0–34.0)
MCHC: 32.9 g/dL (ref 30.0–36.0)
MCV: 78.9 fL (ref 78.0–100.0)
PLATELETS: 202 10*3/uL (ref 150–400)
RBC: 4.35 MIL/uL (ref 3.87–5.11)
RDW: 13 % (ref 11.5–15.5)
WBC: 8.4 10*3/uL (ref 4.0–10.5)

## 2015-01-11 LAB — BASIC METABOLIC PANEL
Anion gap: 6 (ref 5–15)
BUN: 12 mg/dL (ref 6–20)
CALCIUM: 9.2 mg/dL (ref 8.9–10.3)
CHLORIDE: 106 mmol/L (ref 101–111)
CO2: 28 mmol/L (ref 22–32)
Creatinine, Ser: 0.77 mg/dL (ref 0.44–1.00)
GFR calc non Af Amer: >60 mL/min (ref >60)
Glucose, Bld: 100 mg/dL — ABNORMAL HIGH (ref 65–99)
Potassium: 3.7 mmol/L (ref 3.5–5.1)
SODIUM: 140 mmol/L (ref 135–145)

## 2015-01-11 LAB — HEPATIC FUNCTION PANEL
ALBUMIN: 3.7 g/dL (ref 3.5–5.0)
ALT: 12 U/L — ABNORMAL LOW (ref 14–54)
AST: 15 U/L (ref 15–41)
Alkaline Phosphatase: 106 U/L (ref 38–126)
Bilirubin, Direct: 0.1 mg/dL (ref 0.1–0.5)
Indirect Bilirubin: 0.4 mg/dL (ref 0.3–0.9)
Total Bilirubin: 0.5 mg/dL (ref 0.3–1.2)
Total Protein: 7.1 g/dL (ref 6.5–8.1)

## 2015-01-11 LAB — DIFFERENTIAL
BASOS ABS: 0.1 10*3/uL (ref 0.0–0.1)
BASOS PCT: 1 %
Eosinophils Absolute: 0.9 10*3/uL — ABNORMAL HIGH (ref 0.0–0.7)
Eosinophils Relative: 11 %
Lymphocytes Relative: 40 %
Lymphs Abs: 3.4 10*3/uL (ref 0.7–4.0)
Monocytes Absolute: 0.5 10*3/uL (ref 0.1–1.0)
Monocytes Relative: 6 %
NEUTROS PCT: 42 %
Neutro Abs: 3.6 10*3/uL (ref 1.7–7.7)

## 2015-01-11 LAB — LIPASE, BLOOD: Lipase: 21 U/L (ref 11–51)

## 2015-01-11 LAB — TROPONIN I: Troponin I: <0.03 ng/mL (ref <0.031)

## 2015-01-11 IMAGING — DX DG CHEST 2V
2 series · 2 of 2 positions shown · non-contrast
Comparison: [DATE]

CLINICAL DATA: Chest pain

EXAM:
CHEST  2 VIEW

[chest pa]
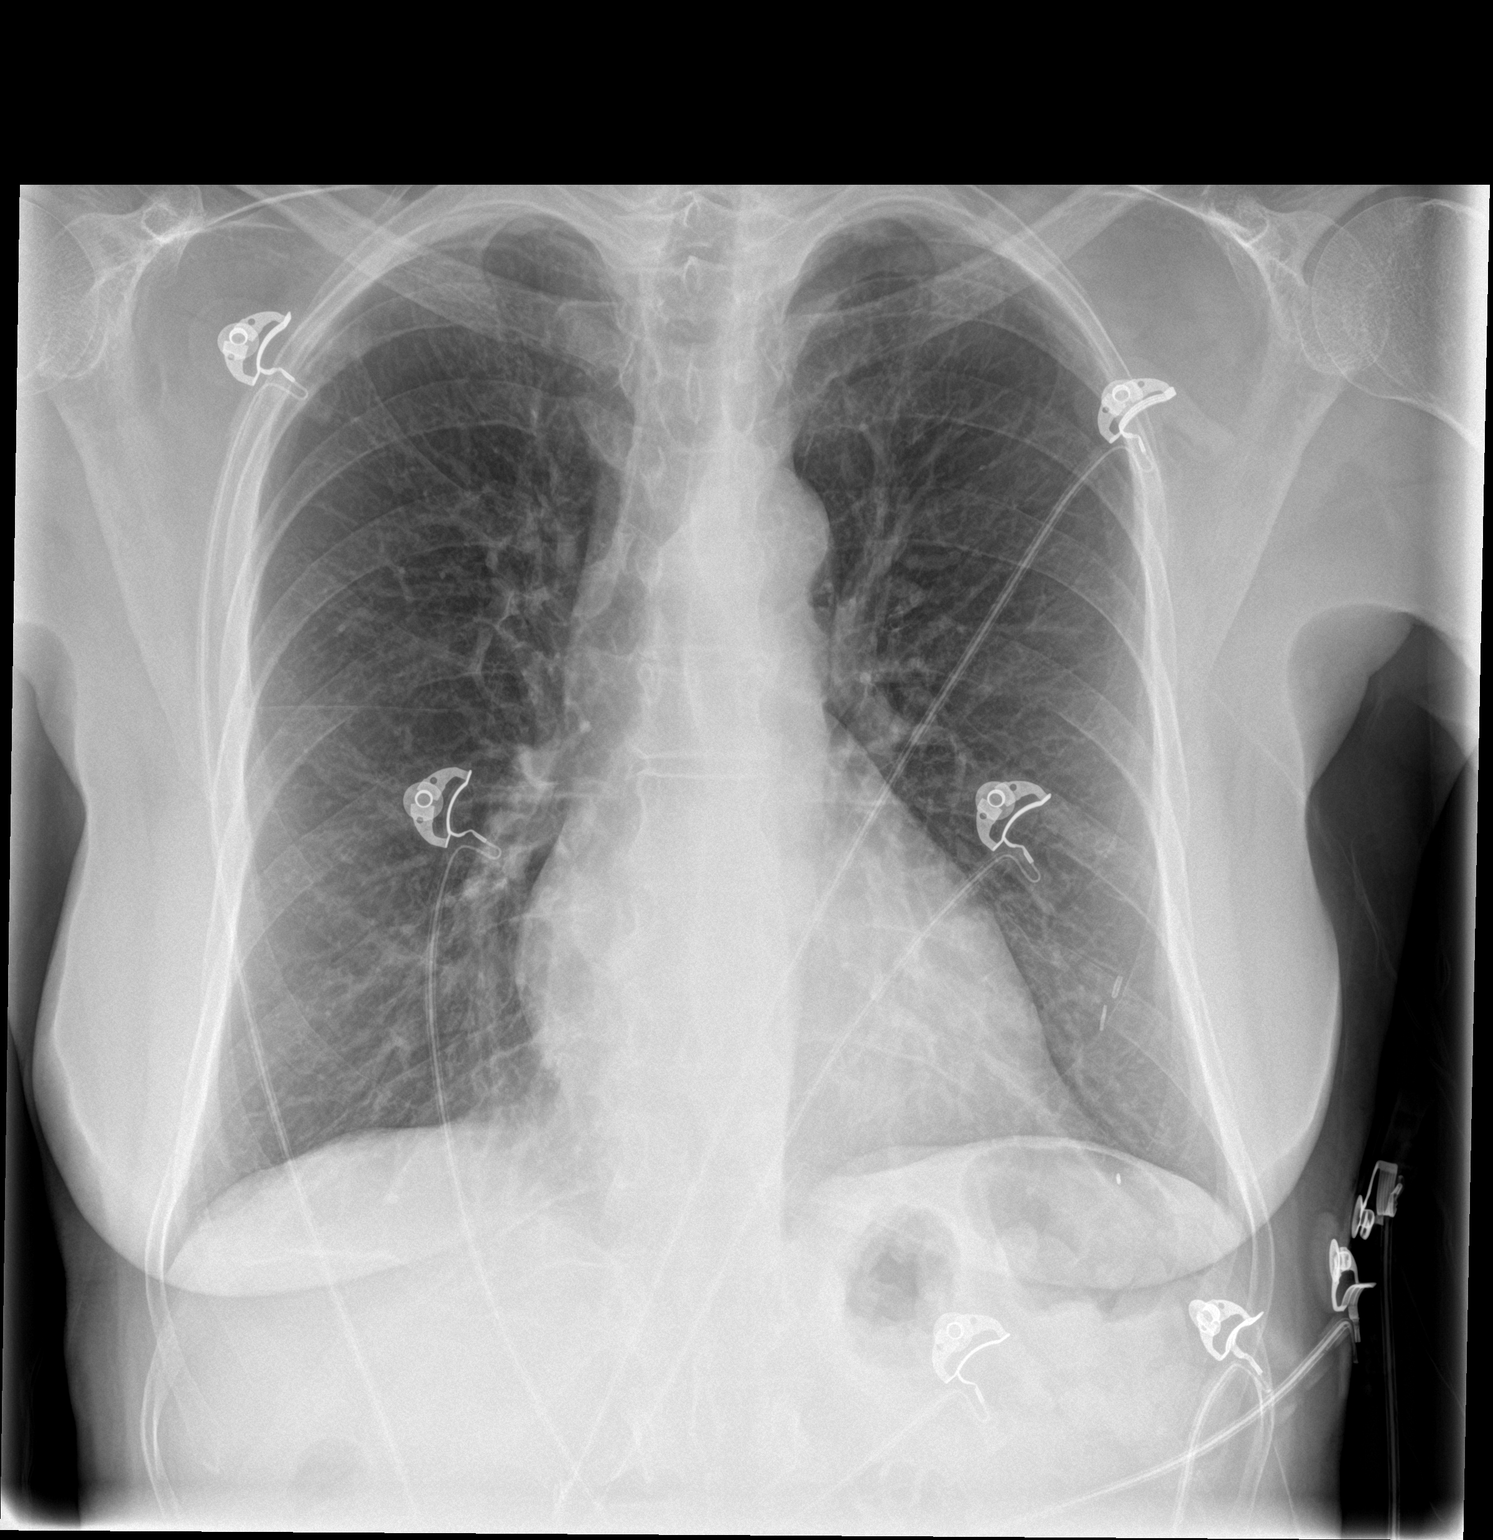

[chest lat]
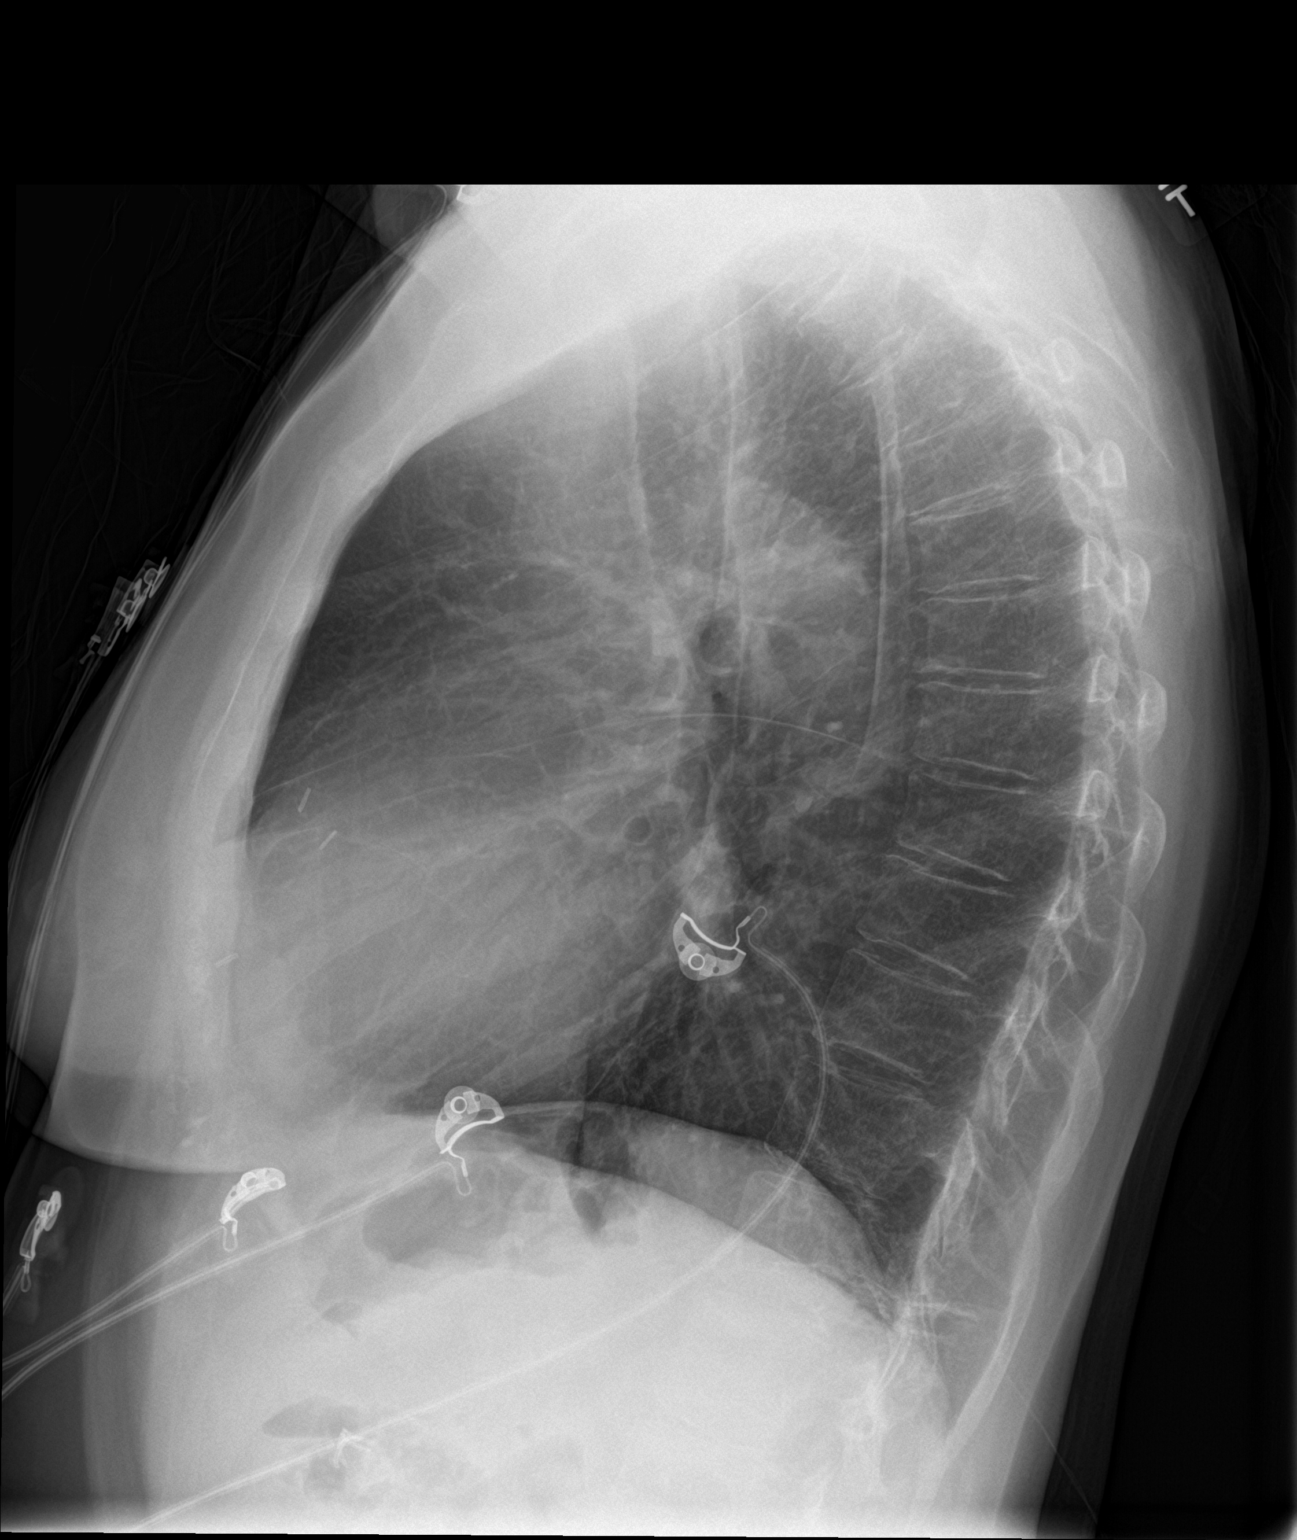

[2 of 2 positions shown; findings below may reference images not displayed]

FINDINGS: Normal heart size and mediastinal contours. No acute infiltrate or
edema. No effusion or pneumothorax. Left breast clips. No acute
osseous findings.
IMPRESSION: No active cardiopulmonary disease.

## 2015-01-11 MED ORDER — PANTOPRAZOLE SODIUM 40 MG PO TBEC
40.0000 mg | DELAYED_RELEASE_TABLET | Freq: Once | ORAL | Status: AC
  Administered 2015-01-11: 40 mg via ORAL
  Filled 2015-01-11: qty 1

## 2015-01-11 MED ORDER — GI COCKTAIL ~~LOC~~
30.0000 mL | Freq: Once | ORAL | Status: AC
  Administered 2015-01-11: 30 mL via ORAL
  Filled 2015-01-11: qty 30

## 2015-01-11 MED ORDER — ASPIRIN 81 MG PO CHEW
324.0000 mg | CHEWABLE_TABLET | Freq: Once | ORAL | Status: AC
  Administered 2015-01-11: 324 mg via ORAL
  Filled 2015-01-11: qty 4

## 2015-01-11 NOTE — Discharge Instructions (Signed)
Take your omeprazole (Prilosec) twice a day for the next week. Do not miss any doses.  Gastroesophageal Reflux Disease, Adult Normally, food travels down the esophagus and stays in the stomach to be digested. However, when a person has gastroesophageal reflux disease (GERD), food and stomach acid move back up into the esophagus. When this happens, the esophagus becomes sore and inflamed. Over time, GERD can create small holes (ulcers) in the lining of the esophagus.  CAUSES This condition is caused by a problem with the muscle between the esophagus and the stomach (lower esophageal sphincter, or LES). Normally, the LES muscle closes after food passes through the esophagus to the stomach. When the LES is weakened or abnormal, it does not close properly, and that allows food and stomach acid to go back up into the esophagus. The LES can be weakened by certain dietary substances, medicines, and medical conditions, including:  Tobacco use.  Pregnancy.  Having a hiatal hernia.  Heavy alcohol use.  Certain foods and beverages, such as coffee, chocolate, onions, and peppermint. RISK FACTORS This condition is more likely to develop in:  People who have an increased body weight.  People who have connective tissue disorders.  People who use NSAID medicines. SYMPTOMS Symptoms of this condition include:  Heartburn.  Difficult or painful swallowing.  The feeling of having a lump in the throat.  Abitter taste in the mouth.  Bad breath.  Having a large amount of saliva.  Having an upset or bloated stomach.  Belching.  Chest pain.  Shortness of breath or wheezing.  Ongoing (chronic) cough or a night-time cough.  Wearing away of tooth enamel.  Weight loss. Different conditions can cause chest pain. Make sure to see your health care provider if you experience chest pain. DIAGNOSIS Your health care provider will take a medical history and perform a physical exam. To determine if  you have mild or severe GERD, your health care provider may also monitor how you respond to treatment. You may also have other tests, including:  An endoscopy toexamine your stomach and esophagus with a small camera.  A test thatmeasures the acidity level in your esophagus.  A test thatmeasures how much pressure is on your esophagus.  A barium swallow or modified barium swallow to show the shape, size, and functioning of your esophagus. TREATMENT The goal of treatment is to help relieve your symptoms and to prevent complications. Treatment for this condition may vary depending on how severe your symptoms are. Your health care provider may recommend:  Changes to your diet.  Medicine.  Surgery. HOME CARE INSTRUCTIONS Diet  Follow a diet as recommended by your health care provider. This may involve avoiding foods and drinks such as:  Coffee and tea (with or without caffeine).  Drinks that containalcohol.  Energy drinks and sports drinks.  Carbonated drinks or sodas.  Chocolate and cocoa.  Peppermint and mint flavorings.  Garlic and onions.  Horseradish.  Spicy and acidic foods, including peppers, chili powder, curry powder, vinegar, hot sauces, and barbecue sauce.  Citrus fruit juices and citrus fruits, such as oranges, lemons, and limes.  Tomato-based foods, such as red sauce, chili, salsa, and pizza with red sauce.  Fried and fatty foods, such as donuts, french fries, potato chips, and high-fat dressings.  High-fat meats, such as hot dogs and fatty cuts of red and white meats, such as rib eye steak, sausage, ham, and bacon.  High-fat dairy items, such as whole milk, butter, and cream cheese.  Eat small, frequent meals instead of large meals.  Avoid drinking large amounts of liquid with your meals.  Avoid eating meals during the 2-3 hours before bedtime.  Avoid lying down right after you eat.  Do not exercise right after you eat. General  Instructions  Pay attention to any changes in your symptoms.  Take over-the-counter and prescription medicines only as told by your health care provider. Do not take aspirin, ibuprofen, or other NSAIDs unless your health care provider told you to do so.  Do not use any tobacco products, including cigarettes, chewing tobacco, and e-cigarettes. If you need help quitting, ask your health care provider.  Wear loose-fitting clothing. Do not wear anything tight around your waist that causes pressure on your abdomen.  Raise (elevate) the head of your bed 6 inches (15cm).  Try to reduce your stress, such as with yoga or meditation. If you need help reducing stress, ask your health care provider.  If you are overweight, reduce your weight to an amount that is healthy for you. Ask your health care provider for guidance about a safe weight loss goal.  Keep all follow-up visits as told by your health care provider. This is important. SEEK MEDICAL CARE IF:  You have new symptoms.  You have unexplained weight loss.  You have difficulty swallowing, or it hurts to swallow.  You have wheezing or a persistent cough.  Your symptoms do not improve with treatment.  You have a hoarse voice. SEEK IMMEDIATE MEDICAL CARE IF:  You have pain in your arms, neck, jaw, teeth, or back.  You feel sweaty, dizzy, or light-headed.  You have chest pain or shortness of breath.  You vomit and your vomit looks like blood or coffee grounds.  You faint.  Your stool is bloody or black.  You cannot swallow, drink, or eat.   This information is not intended to replace advice given to you by your health care provider. Make sure you discuss any questions you have with your health care provider.   Document Released: 10/17/2004 Document Revised: 09/28/2014 Document Reviewed: 05/04/2014 Elsevier Interactive Patient Education [employer].

## 2015-01-11 NOTE — ED Notes (Signed)
Pt c/o mid sternal chest pain that started x 1 1/2 hours ago; pt states the pain radiates around her left rib into her back and describes the pain as a burning, aching pain

## 2015-01-11 NOTE — ED Provider Notes (Signed)
CSN: [id number]     Arrival date & time 01/11/15  0020 History  By signing my name below, I, [facility], attest that this documentation has been prepared under the direction and in the presence of Delora Fuel, MD. Electronically Signed: Virgel Bouquet, ED Scribe. 01/11/2015. 12:53 AM.   Chief Complaint  Patient presents with  . Chest Pain   The history is provided by the patient. No language interpreter was used.   HPI Comments: Kimberly Mccormick is a 64 y.o. female with hx of HTN and GERD who presents to the Emergency Department complaining of sudden onset, 5/10, burning mid-sternal CP that radiates to the back and under left breast onset 1.5 hours ago. She reports that she was in her room relaxing when 9/10 severity chest pain began. Patient endorses similar symptoms 6 months ago but notes that she was diaphoretic during the previous episode. Pain was worsened while driving and is not improved by anything but notes pain did decrease after arrival in ED. She notes that she takes Prilosec every day but did not take this medication today. Per patient, she denies hx of DM and a family hx of heart conditions. She denies cigarette use. Patient denies SOB, nausea, and diaphoresis.  Past Medical History  Diagnosis Date  . GERD (gastroesophageal reflux disease)   . Sinus infection 01/04/14  . Genital herpes   . Hypertension    Past Surgical History  Procedure Laterality Date  . Cholecystectomy    . Appendectomy    . Tonsillectomy    . Nasal sinus surgery    . Cesarean section    . Cataract extraction w/phaco Left 01/10/2014    Procedure: CATARACT EXTRACTION PHACO AND INTRAOCULAR LENS PLACEMENT ; CDE:  4.94;  Surgeon: Williams Che, MD;  Location: [facility];  Service: Ophthalmology;  Laterality: Left;   History reviewed. No pertinent family history. Social History  Substance Use Topics  . Smoking status: Never Smoker   . Smokeless tobacco: None  . Alcohol Use: No   OB History     No data available     Review of Systems  Constitutional: Negative for diaphoresis.  Respiratory: Negative for shortness of breath.   Cardiovascular: Positive for chest pain (Mid-sternal).  Gastrointestinal: Negative for nausea.  All other systems reviewed and are negative.     Allergies  Penicillins  Home Medications   Prior to Admission medications   Medication Sig Start Date End Date Taking? Authorizing Provider  acyclovir (ZOVIRAX) 400 MG tablet Take 400 mg by mouth 2 (two) times daily.   Yes Historical Provider, MD  cetirizine (ZYRTEC) 10 MG tablet Take 10 mg by mouth daily.   Yes Historical Provider, MD  Cholecalciferol (VITAMIN D) 2000 UNITS tablet Take 2,000 Units by mouth daily.   Yes Historical Provider, MD  METOPROLOL SUCCINATE ER PO Take 25 mg by mouth daily.   Yes Historical Provider, MD  nitroGLYCERIN (NITROSTAT) 0.4 MG SL tablet Place 0.4 mg under the tongue every 5 (five) minutes as needed for chest pain.   Yes Historical Provider, MD  omeprazole (PRILOSEC) 40 MG capsule Take 40 mg by mouth daily.   Yes Historical Provider, MD  zolpidem (AMBIEN) 10 MG tablet Take 10 mg by mouth at bedtime as needed for sleep.   Yes Historical Provider, MD   BP 161/76 mmHg  Pulse 68  Temp(Src) 98.7 F (37.1 C) (Oral)  Resp 18  Ht 5\' 4"  (1.626 m)  Wt 120 lb (54.432 kg)  BMI  20.59 kg/m2  SpO2 97% Physical Exam  Constitutional: She is oriented to person, place, and time. She appears well-developed and well-nourished. No distress.  HENT:  Head: Normocephalic and atraumatic.  Eyes: Conjunctivae and EOM are normal.  Neck: Normal range of motion. Neck supple. No JVD present.  Cardiovascular: Normal rate, regular rhythm and normal heart sounds.   No murmur heard. Pulmonary/Chest: Effort normal and breath sounds normal. She has no wheezes. She has no rales. She exhibits no tenderness.  Abdominal: Soft. Bowel sounds are normal. She exhibits no distension and no mass. There is no  tenderness.  Musculoskeletal: Normal range of motion. She exhibits no edema.  Lymphadenopathy:    She has no cervical adenopathy.  Neurological: She is alert and oriented to person, place, and time. No cranial nerve deficit. She exhibits normal muscle tone. Coordination normal.  Skin: Skin is warm and dry. No rash noted.  Psychiatric: She has a normal mood and affect. Her behavior is normal. Judgment and thought content normal.  Nursing note and vitals reviewed.   ED Course  Procedures   DIAGNOSTIC STUDIES: Oxygen Saturation is 97% on RA, normal by my interpretation.    COORDINATION OF CARE: 12:41 AM Discussed treatment plan with pt at bedside and pt agreed to plan.  Labs Review Results for orders placed or performed during the hospital encounter of [id number]  Basic metabolic panel  Result Value Ref Range   Sodium 140 135 - 145 mmol/L   Potassium 3.7 3.5 - 5.1 mmol/L   Chloride 106 101 - 111 mmol/L   CO2 28 22 - 32 mmol/L   Glucose, Bld 100 (H) 65 - 99 mg/dL   BUN 12 6 - 20 mg/dL   Creatinine, Ser 0.77 0.44 - 1.00 mg/dL   Calcium 9.2 8.9 - 10.3 mg/dL   GFR calc non Af Amer >60 >60 mL/min   GFR calc Af Amer >60 >60 mL/min   Anion gap 6 5 - 15  CBC  Result Value Ref Range   WBC 8.4 4.0 - 10.5 K/uL   RBC 4.35 3.87 - 5.11 MIL/uL   Hemoglobin 11.3 (L) 12.0 - 15.0 g/dL   HCT 34.3 (L) 36.0 - 46.0 %   MCV 78.9 78.0 - 100.0 fL   MCH 26.0 26.0 - 34.0 pg   MCHC 32.9 30.0 - 36.0 g/dL   RDW 13.0 11.5 - 15.5 %   Platelets 202 150 - 400 K/uL  Troponin I  Result Value Ref Range   Troponin I <0.03 <0.031 ng/mL  Differential  Result Value Ref Range   Neutrophils Relative % 42 %   Neutro Abs 3.6 1.7 - 7.7 K/uL   Lymphocytes Relative 40 %   Lymphs Abs 3.4 0.7 - 4.0 K/uL   Monocytes Relative 6 %   Monocytes Absolute 0.5 0.1 - 1.0 K/uL   Eosinophils Relative 11 %   Eosinophils Absolute 0.9 (H) 0.0 - 0.7 K/uL   Basophils Relative 1 %   Basophils Absolute 0.1 0.0 - 0.1 K/uL   Hepatic function panel  Result Value Ref Range   Total Protein 7.1 6.5 - 8.1 g/dL   Albumin 3.7 3.5 - 5.0 g/dL   AST 15 15 - 41 U/L   ALT 12 (L) 14 - 54 U/L   Alkaline Phosphatase 106 38 - 126 U/L   Total Bilirubin 0.5 0.3 - 1.2 mg/dL   Bilirubin, Direct 0.1 0.1 - 0.5 mg/dL   Indirect Bilirubin 0.4 0.3 - 0.9 mg/dL  Lipase, blood  Result Value Ref Range   Lipase 21 11 - 51 U/L   Imaging Review Dg Chest 2 View  01/11/2015  CLINICAL DATA:  Chest pain EXAM: CHEST  2 VIEW COMPARISON:  06/17/2014 FINDINGS: Normal heart size and mediastinal contours. No acute infiltrate or edema. No effusion or pneumothorax. Left breast clips. No acute osseous findings. IMPRESSION: No active cardiopulmonary disease. Electronically Signed   By: Monte Fantasia M.D.   On: 01/11/2015 01:13   I have personally reviewed and evaluated these images and lab results as part of my medical decision-making.   EKG Interpretation   Date/Time:  Wednesday January 11 2015 00:29:59 EST Ventricular Rate:  67 PR Interval:  133 QRS Duration: 79 QT Interval:  469 QTC Calculation: 495 R Axis:   34 Text Interpretation:  Sinus rhythm Abnormal R-wave progression, early  transition Borderline prolonged QT interval When compared with ECG of  06/24/2014, No significant change was found Confirmed by [facility]  MD, Nazirah Tri  ([id number]) on 01/11/2015 12:39:08 AM      MDM   Final diagnoses:  Gastroesophageal reflux disease without esophagitis  Normochromic normocytic anemia    Chest pain which seems most likely be due to gastroesophageal reflux. She did miss her dose of omeprazole today. No factors and her history suggestive of cardiac origin. She is given a dose of GI cocktail with excellent relief of symptoms. She is discharged with a dose of pantoprazole and advised to increase her him up result twice a day for the next week. Old records were reviewed and she has no relevant past visits.  I personally performed the services  described in this documentation, which was scribed in my presence. The recorded information has been reviewed and is accurate.     Delora Fuel, MD [id number]

## 2015-02-16 DIAGNOSIS — M2011 Hallux valgus (acquired), right foot: Secondary | ICD-10-CM | POA: Diagnosis not present

## 2015-02-16 DIAGNOSIS — M79672 Pain in left foot: Secondary | ICD-10-CM | POA: Diagnosis not present

## 2015-02-16 DIAGNOSIS — M79671 Pain in right foot: Secondary | ICD-10-CM | POA: Diagnosis not present

## 2015-02-16 DIAGNOSIS — M2012 Hallux valgus (acquired), left foot: Secondary | ICD-10-CM | POA: Diagnosis not present

## 2015-02-22 DIAGNOSIS — K649 Unspecified hemorrhoids: Secondary | ICD-10-CM | POA: Diagnosis not present

## 2015-02-22 DIAGNOSIS — I10 Essential (primary) hypertension: Secondary | ICD-10-CM | POA: Diagnosis not present

## 2015-02-22 DIAGNOSIS — Z6822 Body mass index (BMI) 22.0-22.9, adult: Secondary | ICD-10-CM | POA: Diagnosis not present

## 2015-02-22 DIAGNOSIS — J329 Chronic sinusitis, unspecified: Secondary | ICD-10-CM | POA: Diagnosis not present

## 2015-02-22 DIAGNOSIS — D509 Iron deficiency anemia, unspecified: Secondary | ICD-10-CM | POA: Diagnosis not present

## 2015-02-22 DIAGNOSIS — E782 Mixed hyperlipidemia: Secondary | ICD-10-CM | POA: Diagnosis not present

## 2015-02-22 DIAGNOSIS — K219 Gastro-esophageal reflux disease without esophagitis: Secondary | ICD-10-CM | POA: Diagnosis not present

## 2015-02-22 DIAGNOSIS — R152 Fecal urgency: Secondary | ICD-10-CM | POA: Diagnosis not present

## 2015-02-23 ENCOUNTER — Encounter (INDEPENDENT_AMBULATORY_CARE_PROVIDER_SITE_OTHER): Payer: Self-pay | Admitting: *Deleted

## 2015-03-21 ENCOUNTER — Encounter (INDEPENDENT_AMBULATORY_CARE_PROVIDER_SITE_OTHER): Payer: Self-pay | Admitting: Internal Medicine

## 2015-03-21 ENCOUNTER — Ambulatory Visit (INDEPENDENT_AMBULATORY_CARE_PROVIDER_SITE_OTHER): Payer: BLUE CROSS/BLUE SHIELD | Admitting: Internal Medicine

## 2015-03-21 VITALS — BP 146/90 | HR 60 | Temp 98.0°F | Ht 65.0 in | Wt 125.5 lb

## 2015-03-21 DIAGNOSIS — A6000 Herpesviral infection of urogenital system, unspecified: Secondary | ICD-10-CM | POA: Insufficient documentation

## 2015-03-21 DIAGNOSIS — R152 Fecal urgency: Secondary | ICD-10-CM | POA: Insufficient documentation

## 2015-03-21 DIAGNOSIS — D508 Other iron deficiency anemias: Secondary | ICD-10-CM

## 2015-03-21 DIAGNOSIS — K219 Gastro-esophageal reflux disease without esophagitis: Secondary | ICD-10-CM

## 2015-03-21 DIAGNOSIS — E7800 Pure hypercholesterolemia, unspecified: Secondary | ICD-10-CM | POA: Diagnosis not present

## 2015-03-21 DIAGNOSIS — E785 Hyperlipidemia, unspecified: Secondary | ICD-10-CM | POA: Insufficient documentation

## 2015-03-21 DIAGNOSIS — I10 Essential (primary) hypertension: Secondary | ICD-10-CM | POA: Diagnosis not present

## 2015-03-21 LAB — CBC WITH DIFFERENTIAL/PLATELET
Basophils Absolute: 0.1 10*3/uL (ref 0.0–0.1)
Basophils Relative: 1 % (ref 0–1)
EOS PCT: 7 % — AB (ref 0–5)
Eosinophils Absolute: 0.5 10*3/uL (ref 0.0–0.7)
HEMATOCRIT: 34.8 % — AB (ref 36.0–46.0)
Hemoglobin: 12 g/dL (ref 12.0–15.0)
LYMPHS ABS: 3.5 10*3/uL (ref 0.7–4.0)
LYMPHS PCT: 49 % — AB (ref 12–46)
MCH: 26.7 pg (ref 26.0–34.0)
MCHC: 34.5 g/dL (ref 30.0–36.0)
MCV: 77.3 fL — AB (ref 78.0–100.0)
MONO ABS: 0.5 10*3/uL (ref 0.1–1.0)
MONOS PCT: 7 % (ref 3–12)
MPV: 9 fL (ref 8.6–12.4)
Neutro Abs: 2.6 10*3/uL (ref 1.7–7.7)
Neutrophils Relative %: 36 % — ABNORMAL LOW (ref 43–77)
Platelets: 246 10*3/uL (ref 150–400)
RBC: 4.5 MIL/uL (ref 3.87–5.11)
RDW: 14.3 % (ref 11.5–15.5)
WBC: 7.2 10*3/uL (ref 4.0–10.5)

## 2015-03-21 MED ORDER — DICYCLOMINE HCL 10 MG PO CAPS
10.0000 mg | ORAL_CAPSULE | Freq: Three times a day (TID) | ORAL | Status: DC
Start: 2015-03-21 — End: 2017-06-26

## 2015-03-21 NOTE — Progress Notes (Signed)
Subjective:    Patient ID: Kimberly Mccormick, female    DOB: 10/30/1950, 65 y.o.   MRN: [id number]  HPI Referred by Dr. Pleas Koch for fecal urgency. She tells me she has been having problems with her stomach. When she eats she will have to go to the [facility] and have a bowel movement. Her stools are usually formed and sometimes they are loose. She has had symptoms for a couple of years.  She usually has a  BM x 3 a day and sometimes she may only have one BM. She has not seen any melena or [facility].  She thinks she has lost 6 pounds since November. GERD is controlled with Omeprazole.  She avoids all dairy products. If she does, she will have cramps.  She tells me she has pain across her left lower rib cage   Family hx of Crohn's in a niece and 2 nephews. 02/17/2015 Albumin 4.3, total bili 0.7, ALP 112, AST 18, ALT 14 Hx of anemia for years.  11/21/2008 Colonoscopy which was incomplete to hepatic flexure.: Redundant colon. Segments that were examined were normal except for some pigmentation in sigmoid colon. External hermorrhoids.  Review of Systems Past Medical History  Diagnosis Date  . GERD (gastroesophageal reflux disease)   . Sinus infection 01/04/14  . Genital herpes   . Hypertension     Past Surgical History  Procedure Laterality Date  . Cholecystectomy    . Appendectomy    . Tonsillectomy    . Nasal sinus surgery    . Cesarean section    . Cataract extraction w/phaco Left 01/10/2014    Procedure: CATARACT EXTRACTION PHACO AND INTRAOCULAR LENS PLACEMENT ; CDE:  4.94;  Surgeon: Williams Che, MD;  Location: [facility];  Service: Ophthalmology;  Laterality: Left;    Allergies  Allergen Reactions  . Penicillins Hives    Current Outpatient Prescriptions on File Prior to Visit  Medication Sig Dispense Refill  . acyclovir (ZOVIRAX) 400 MG tablet Take 400 mg by mouth 2 (two) times daily.    . cetirizine (ZYRTEC) 10 MG tablet Take 10 mg by mouth daily.    . Cholecalciferol (VITAMIN D)  2000 UNITS tablet Take 2,000 Units by mouth daily.    Marland Kitchen METOPROLOL SUCCINATE ER PO Take 25 mg by mouth daily.    . nitroGLYCERIN (NITROSTAT) 0.4 MG SL tablet Place 0.4 mg under the tongue every 5 (five) minutes as needed for chest pain.    Marland Kitchen omeprazole (PRILOSEC) 40 MG capsule Take 40 mg by mouth daily.    Marland Kitchen zolpidem (AMBIEN) 10 MG tablet Take 10 mg by mouth at bedtime as needed for sleep.     No current facility-administered medications on file prior to visit.        Objective:   Physical Exam Blood pressure 146/90, pulse 60, temperature 98 F (36.7 C), height 5\' 5"  (1.651 m), weight 125 lb 8 oz (56.926 kg). Alert and oriented. Skin warm and dry. Oral mucosa is moist.   . Sclera anicteric, conjunctivae is pink. Thyroid not enlarged. No cervical lymphadenopathy. Lungs clear. Heart regular rate and rhythm.  Abdomen is soft. Bowel sounds are positive. No hepatomegaly. No abdominal masses felt. No abdominal  tenderness.  No edema to lower extremities.  Stool brown and guaiac negative.    Lot [id number] Ex 9/17    Assessment & Plan:  Fecal urgency. IBS suspected. Am going to get Barium enema study from [facility]. Am going to try her on Dicyclomine 10mg   TID. CBC today OV in 3 months.

## 2015-03-21 NOTE — Patient Instructions (Signed)
Dicyclomine 10mg  30 minutes before each meal. OV in 3 months.

## 2015-04-20 ENCOUNTER — Encounter (INDEPENDENT_AMBULATORY_CARE_PROVIDER_SITE_OTHER): Payer: Self-pay

## 2015-05-25 DIAGNOSIS — M2012 Hallux valgus (acquired), left foot: Secondary | ICD-10-CM | POA: Diagnosis not present

## 2015-05-25 DIAGNOSIS — M79671 Pain in right foot: Secondary | ICD-10-CM | POA: Diagnosis not present

## 2015-05-25 DIAGNOSIS — M2011 Hallux valgus (acquired), right foot: Secondary | ICD-10-CM | POA: Diagnosis not present

## 2015-05-25 DIAGNOSIS — M79672 Pain in left foot: Secondary | ICD-10-CM | POA: Diagnosis not present

## 2015-06-20 ENCOUNTER — Ambulatory Visit (INDEPENDENT_AMBULATORY_CARE_PROVIDER_SITE_OTHER): Payer: BLUE CROSS/BLUE SHIELD | Admitting: Internal Medicine

## 2015-06-29 ENCOUNTER — Ambulatory Visit (INDEPENDENT_AMBULATORY_CARE_PROVIDER_SITE_OTHER): Payer: BLUE CROSS/BLUE SHIELD | Admitting: Internal Medicine

## 2015-07-03 ENCOUNTER — Ambulatory Visit (INDEPENDENT_AMBULATORY_CARE_PROVIDER_SITE_OTHER): Payer: BLUE CROSS/BLUE SHIELD | Admitting: Internal Medicine

## 2015-07-03 ENCOUNTER — Encounter (INDEPENDENT_AMBULATORY_CARE_PROVIDER_SITE_OTHER): Payer: Self-pay | Admitting: Internal Medicine

## 2015-07-04 ENCOUNTER — Encounter (INDEPENDENT_AMBULATORY_CARE_PROVIDER_SITE_OTHER): Payer: Self-pay | Admitting: Internal Medicine

## 2015-07-05 DIAGNOSIS — M25571 Pain in right ankle and joints of right foot: Secondary | ICD-10-CM | POA: Diagnosis not present

## 2015-07-05 DIAGNOSIS — K219 Gastro-esophageal reflux disease without esophagitis: Secondary | ICD-10-CM | POA: Diagnosis not present

## 2015-07-05 DIAGNOSIS — I10 Essential (primary) hypertension: Secondary | ICD-10-CM | POA: Diagnosis not present

## 2015-07-05 DIAGNOSIS — Z79899 Other long term (current) drug therapy: Secondary | ICD-10-CM | POA: Diagnosis not present

## 2015-07-05 DIAGNOSIS — S93401A Sprain of unspecified ligament of right ankle, initial encounter: Secondary | ICD-10-CM | POA: Diagnosis not present

## 2015-07-05 DIAGNOSIS — M79671 Pain in right foot: Secondary | ICD-10-CM | POA: Diagnosis not present

## 2015-08-28 DIAGNOSIS — D509 Iron deficiency anemia, unspecified: Secondary | ICD-10-CM | POA: Diagnosis not present

## 2015-08-28 DIAGNOSIS — H6121 Impacted cerumen, right ear: Secondary | ICD-10-CM | POA: Diagnosis not present

## 2015-08-28 DIAGNOSIS — Z803 Family history of malignant neoplasm of breast: Secondary | ICD-10-CM | POA: Diagnosis not present

## 2015-08-28 DIAGNOSIS — R152 Fecal urgency: Secondary | ICD-10-CM | POA: Diagnosis not present

## 2015-08-28 DIAGNOSIS — Z6822 Body mass index (BMI) 22.0-22.9, adult: Secondary | ICD-10-CM | POA: Diagnosis not present

## 2015-08-28 DIAGNOSIS — E782 Mixed hyperlipidemia: Secondary | ICD-10-CM | POA: Diagnosis not present

## 2015-08-28 DIAGNOSIS — I10 Essential (primary) hypertension: Secondary | ICD-10-CM | POA: Diagnosis not present

## 2015-08-28 DIAGNOSIS — Z0001 Encounter for general adult medical examination with abnormal findings: Secondary | ICD-10-CM | POA: Diagnosis not present

## 2015-08-31 DIAGNOSIS — Z1231 Encounter for screening mammogram for malignant neoplasm of breast: Secondary | ICD-10-CM | POA: Diagnosis not present

## 2015-09-25 ENCOUNTER — Emergency Department (HOSPITAL_COMMUNITY)
Admission: EM | Admit: 2015-09-25 | Discharge: 2015-09-26 | Disposition: A | Discharge status: Home / Self Care | Payer: BLUE CROSS/BLUE SHIELD | Attending: Emergency Medicine | Admitting: Emergency Medicine

## 2015-09-25 ENCOUNTER — Encounter (HOSPITAL_COMMUNITY): Payer: Self-pay

## 2015-09-25 DIAGNOSIS — M542 Cervicalgia: Secondary | ICD-10-CM | POA: Insufficient documentation

## 2015-09-25 DIAGNOSIS — R197 Diarrhea, unspecified: Secondary | ICD-10-CM | POA: Insufficient documentation

## 2015-09-25 DIAGNOSIS — M25512 Pain in left shoulder: Secondary | ICD-10-CM | POA: Diagnosis not present

## 2015-09-25 DIAGNOSIS — M79602 Pain in left arm: Secondary | ICD-10-CM

## 2015-09-25 DIAGNOSIS — I10 Essential (primary) hypertension: Secondary | ICD-10-CM | POA: Insufficient documentation

## 2015-09-25 DIAGNOSIS — E860 Dehydration: Secondary | ICD-10-CM | POA: Diagnosis not present

## 2015-09-25 DIAGNOSIS — R1110 Vomiting, unspecified: Secondary | ICD-10-CM | POA: Diagnosis not present

## 2015-09-25 DIAGNOSIS — Z79899 Other long term (current) drug therapy: Secondary | ICD-10-CM | POA: Insufficient documentation

## 2015-09-26 ENCOUNTER — Emergency Department (HOSPITAL_COMMUNITY): Payer: BLUE CROSS/BLUE SHIELD

## 2015-09-26 ENCOUNTER — Encounter (HOSPITAL_COMMUNITY): Payer: Self-pay | Admitting: *Deleted

## 2015-09-26 DIAGNOSIS — M542 Cervicalgia: Secondary | ICD-10-CM | POA: Diagnosis not present

## 2015-09-26 DIAGNOSIS — M25512 Pain in left shoulder: Secondary | ICD-10-CM | POA: Diagnosis not present

## 2015-09-26 LAB — COMPREHENSIVE METABOLIC PANEL
ALBUMIN: 4.1 g/dL (ref 3.5–5.0)
ALT: 14 U/L (ref 14–54)
ANION GAP: 10 (ref 5–15)
AST: 20 U/L (ref 15–41)
Alkaline Phosphatase: 90 U/L (ref 38–126)
BUN: 14 mg/dL (ref 6–20)
CHLORIDE: 104 mmol/L (ref 101–111)
CO2: 26 mmol/L (ref 22–32)
Calcium: 9.4 mg/dL (ref 8.9–10.3)
Creatinine, Ser: 1.08 mg/dL — ABNORMAL HIGH (ref 0.44–1.00)
GFR calc Af Amer: >60 mL/min (ref >60)
GFR calc non Af Amer: 52 mL/min — ABNORMAL LOW (ref >60)
GLUCOSE: 102 mg/dL — AB (ref 65–99)
Potassium: 3.5 mmol/L (ref 3.5–5.1)
SODIUM: 140 mmol/L (ref 135–145)
TOTAL PROTEIN: 7.7 g/dL (ref 6.5–8.1)
Total Bilirubin: 0.9 mg/dL (ref 0.3–1.2)

## 2015-09-26 LAB — CBC WITH DIFFERENTIAL/PLATELET
BASOS ABS: 0.1 10*3/uL (ref 0.0–0.1)
Basophils Relative: 1 %
Eosinophils Absolute: 1 10*3/uL — ABNORMAL HIGH (ref 0.0–0.7)
Eosinophils Relative: 12 %
HEMATOCRIT: 36.4 % (ref 36.0–46.0)
Hemoglobin: 12.1 g/dL (ref 12.0–15.0)
LYMPHS ABS: 3.6 10*3/uL (ref 0.7–4.0)
LYMPHS PCT: 44 %
MCH: 26.2 pg (ref 26.0–34.0)
MCHC: 33.2 g/dL (ref 30.0–36.0)
MCV: 79 fL (ref 78.0–100.0)
MONO ABS: 0.4 10*3/uL (ref 0.1–1.0)
MONOS PCT: 5 %
NEUTROS ABS: 3.1 10*3/uL (ref 1.7–7.7)
Neutrophils Relative %: 38 %
Platelets: 250 10*3/uL (ref 150–400)
RBC: 4.61 MIL/uL (ref 3.87–5.11)
RDW: 13.1 % (ref 11.5–15.5)
WBC: 8.2 10*3/uL (ref 4.0–10.5)

## 2015-09-26 LAB — TROPONIN I: Troponin I: <0.03 ng/mL (ref <0.03)

## 2015-09-26 IMAGING — DX DG SHOULDER 2+V*L*
3 series · 3 of 3 positions shown · non-contrast
Comparison: None.

CLINICAL DATA: Acute onset of left shoulder pain, radiating to the
neck. Initial encounter.

EXAM:
LEFT SHOULDER - 2+ VIEW

[shoulder grashey]
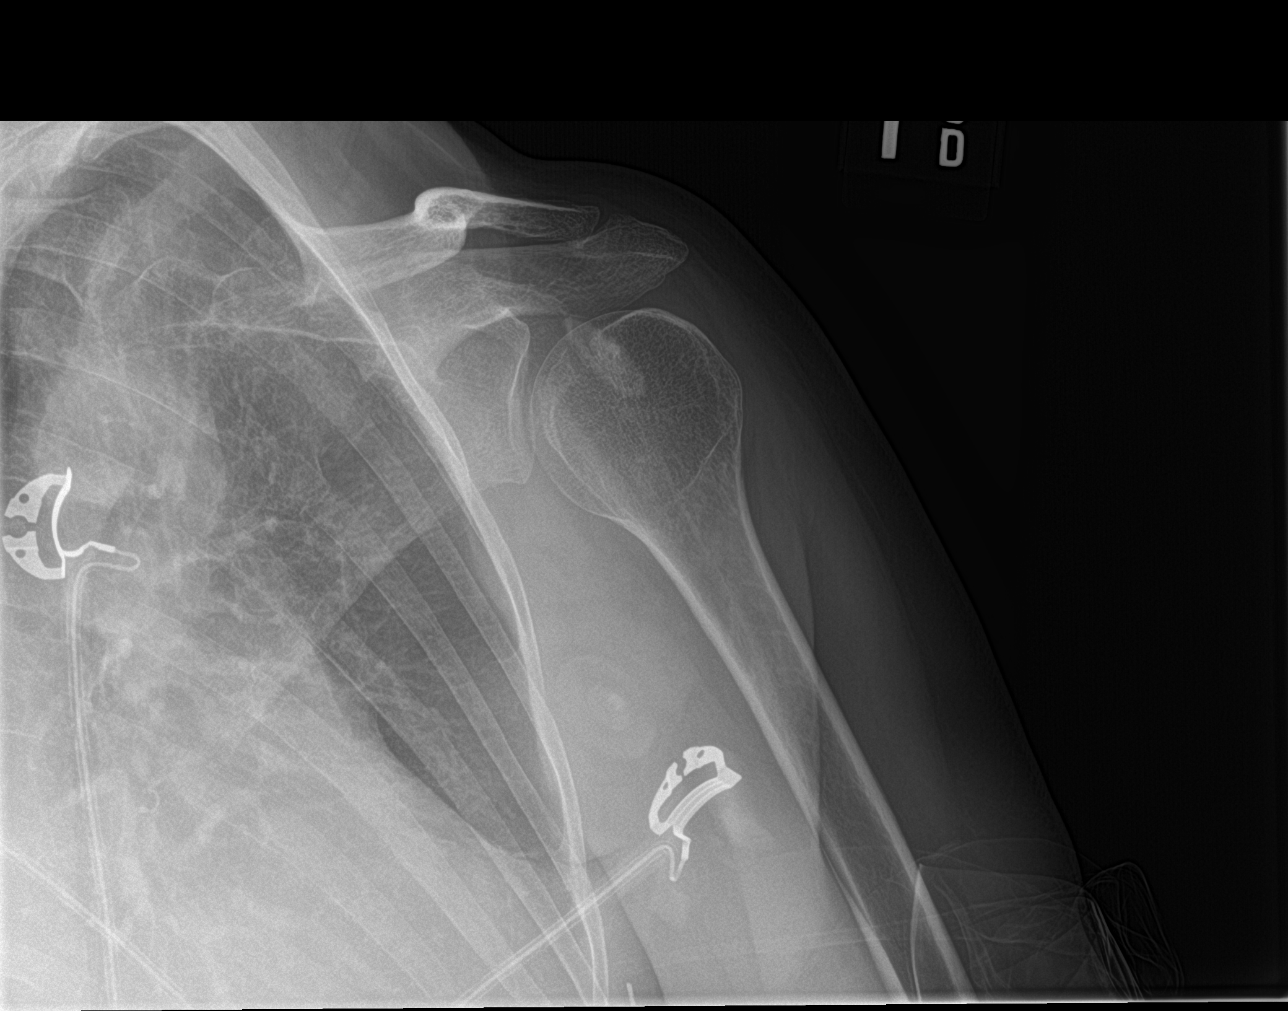

[shoulder y view]
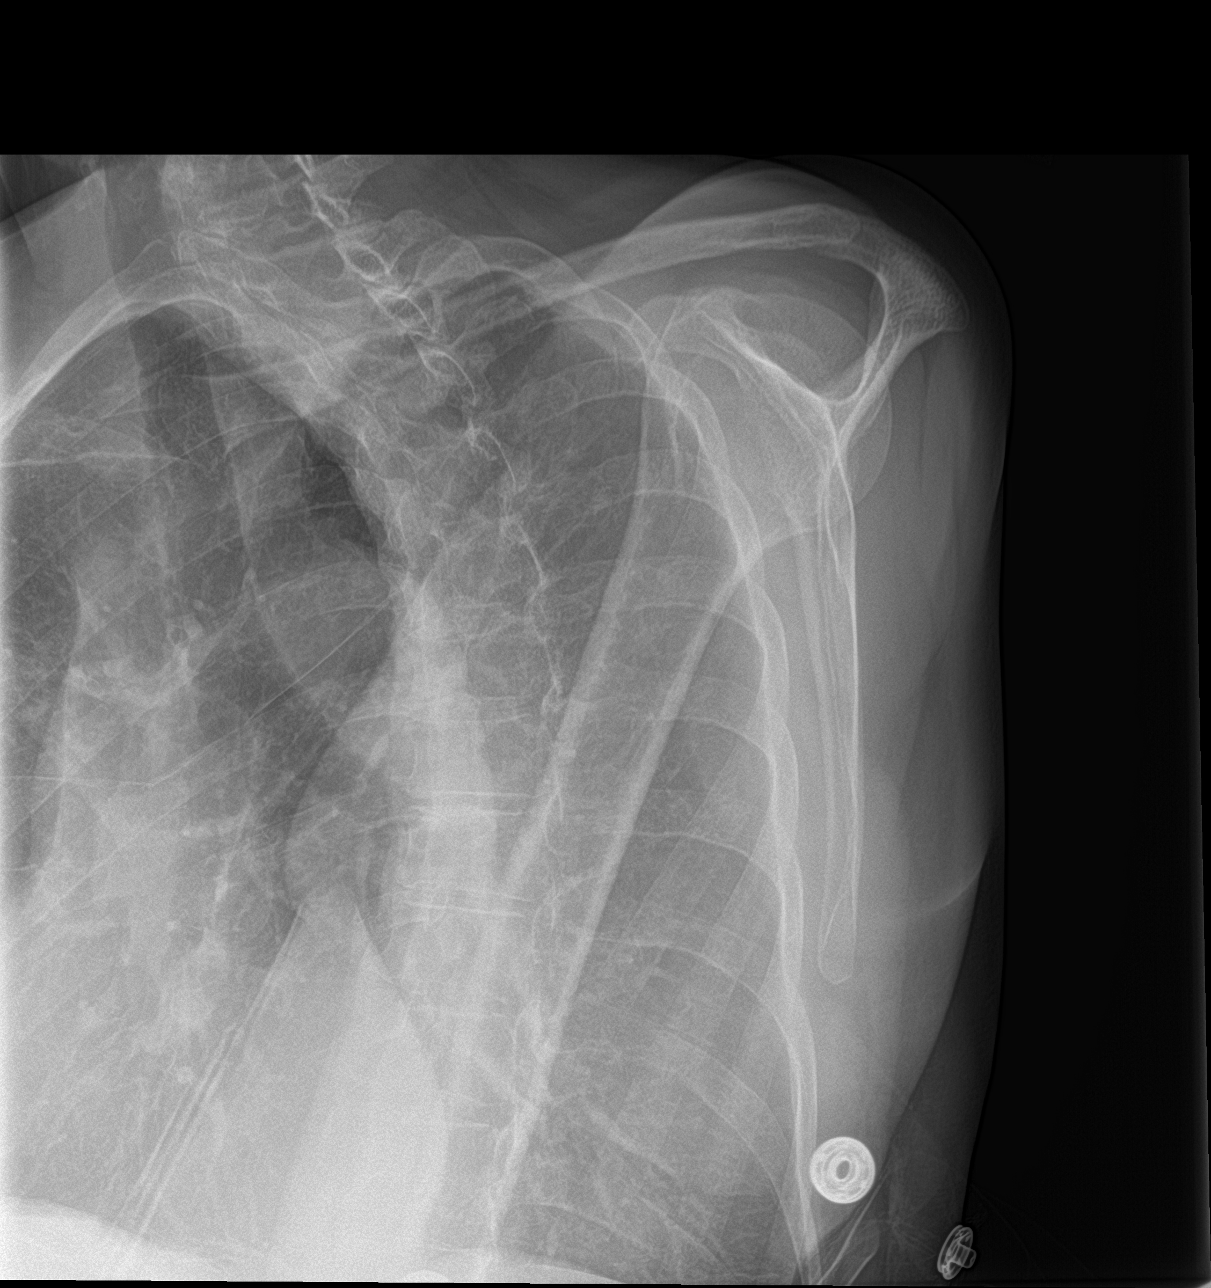

[shoulder axillary]
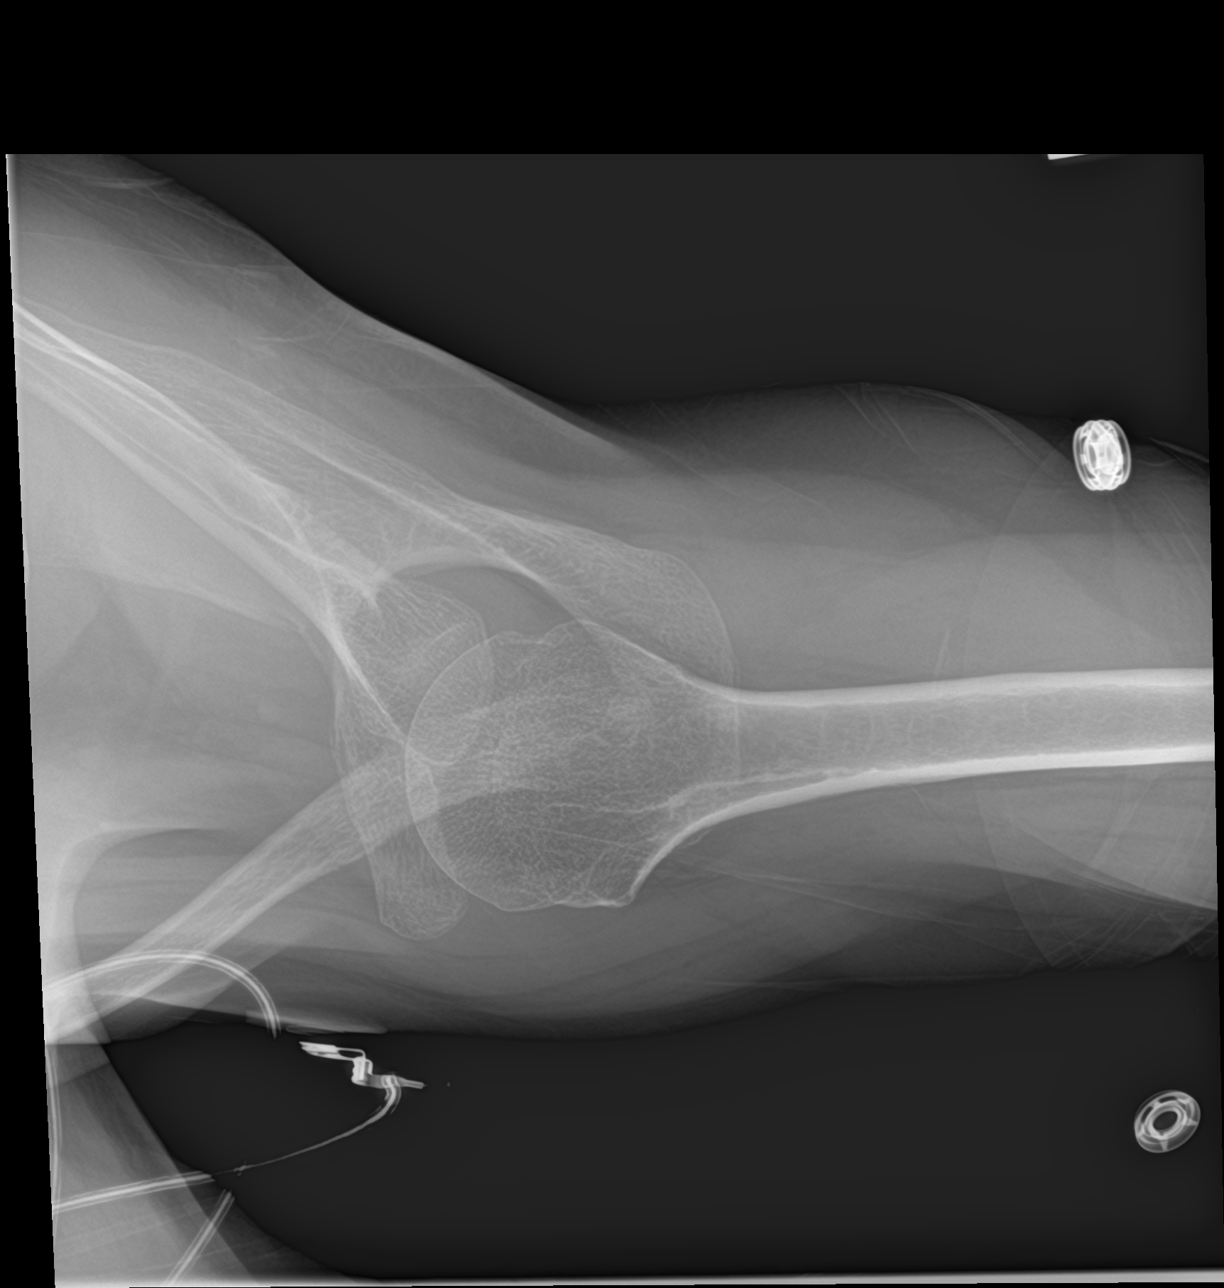

[3 of 3 positions shown; findings below may reference images not displayed]

FINDINGS: There is no evidence of fracture or dislocation. The left humeral
head is seated within the glenoid fossa. Calcification overlying the
left humeral head may reflect calcific tendinitis.

The acromioclavicular joint is unremarkable in appearance. No
significant soft tissue abnormalities are seen. The visualized
portions of the left lung are clear.
IMPRESSION: 1. No evidence of fracture or dislocation.
2. Calcification overlying the left humeral head may reflect
calcific tendinitis.

## 2015-09-26 IMAGING — DX DG CERVICAL SPINE COMPLETE 4+V
6 series · 6 of 6 positions shown · non-contrast
Comparison: None.

CLINICAL DATA: Acute onset of left shoulder pain, radiating to the
neck. Initial encounter.

EXAM:
CERVICAL SPINE - COMPLETE 4+ VIEW

[c-spine lat]
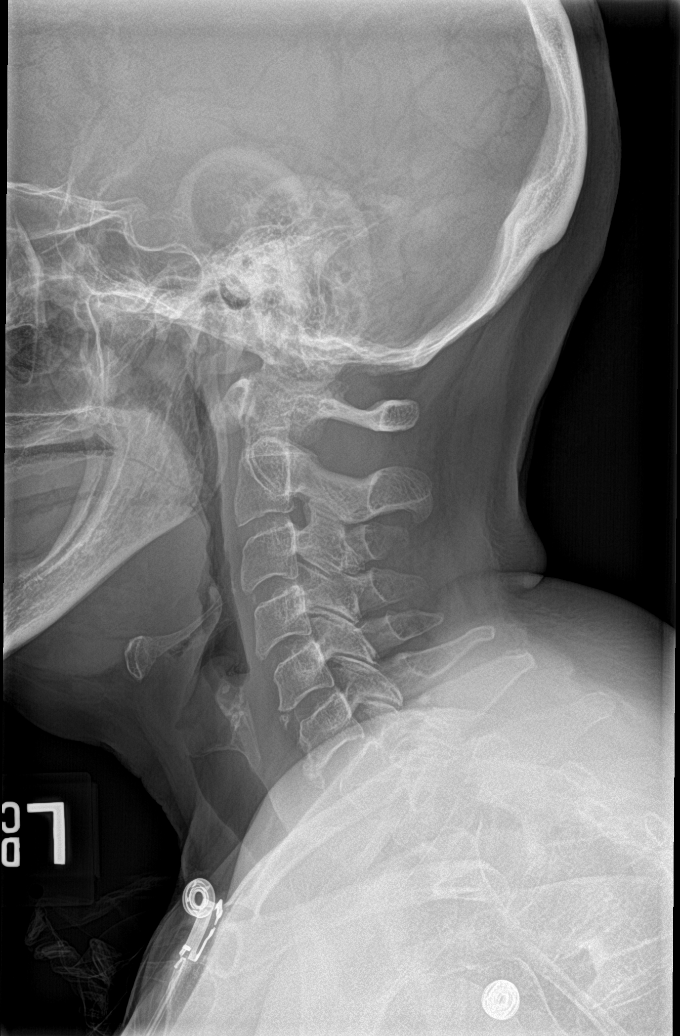

[c-spine obl (1 of 2)]
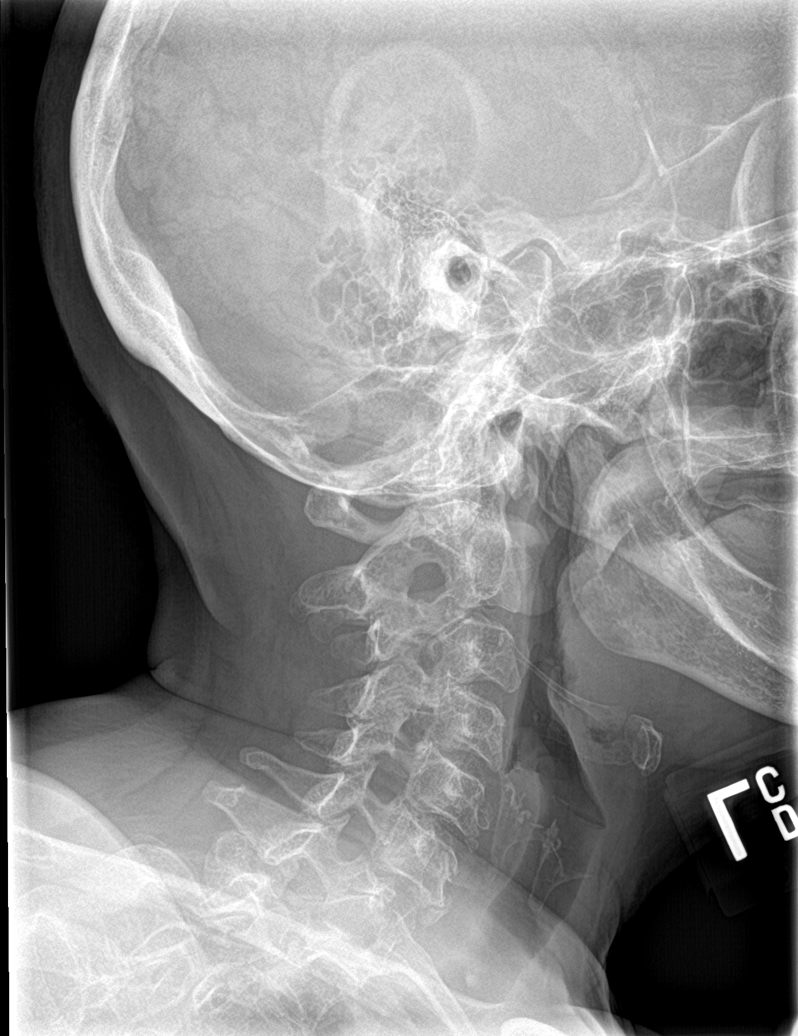

[c-spine obl (2 of 2)]
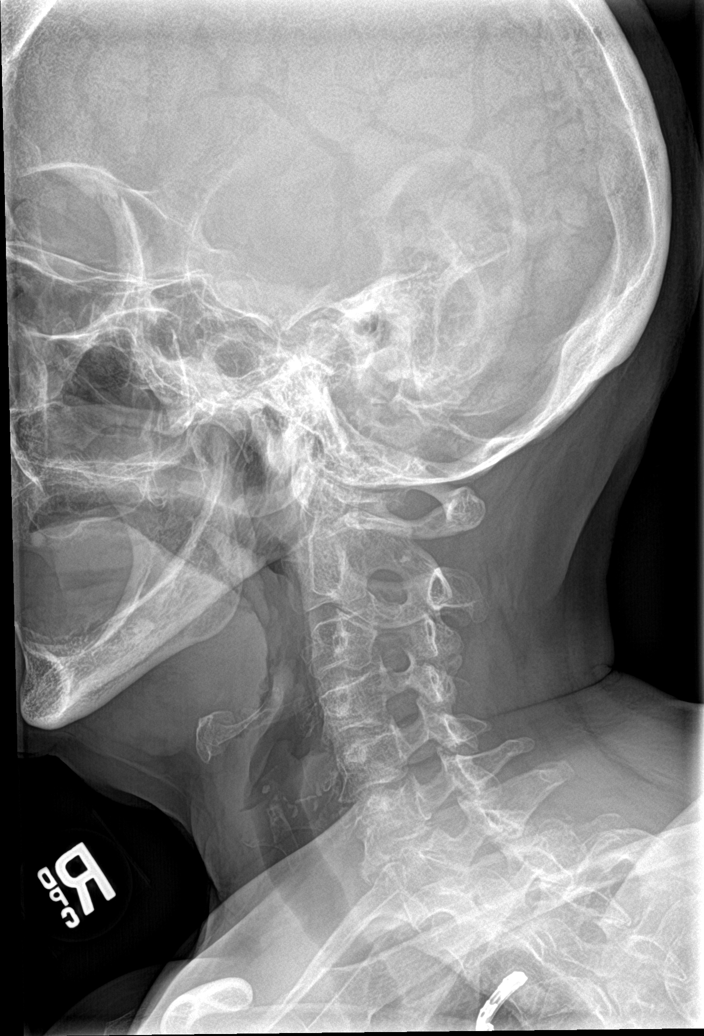

[c-spine ap]
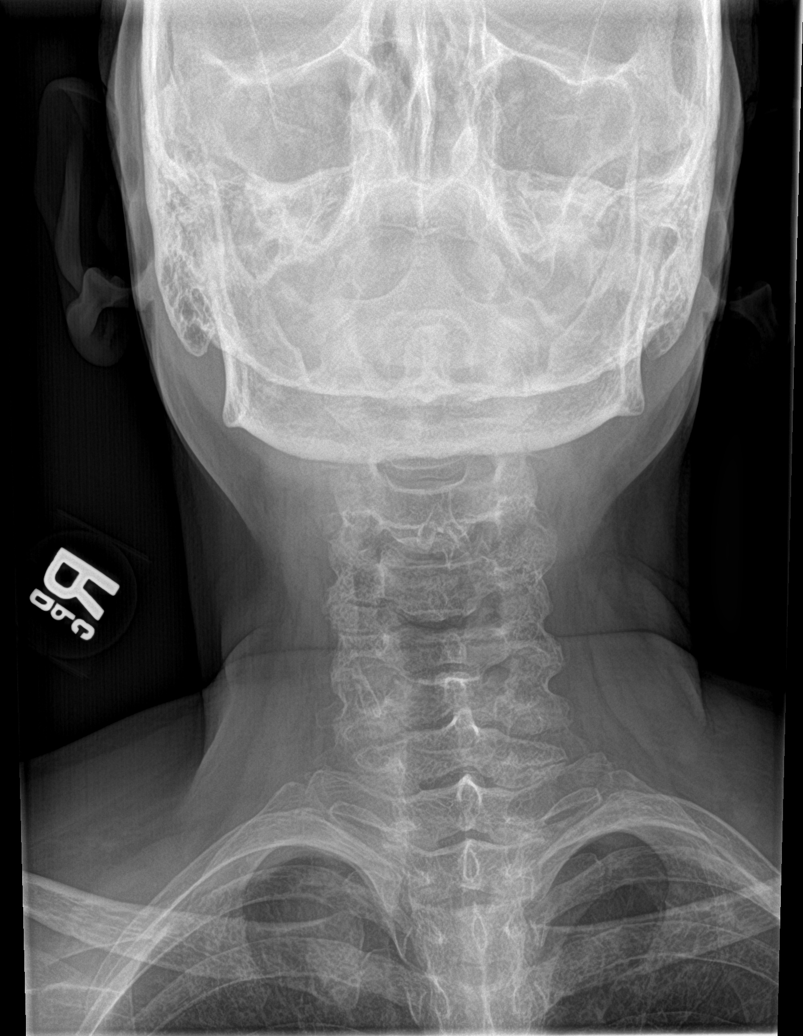

[c-spine open mouth]
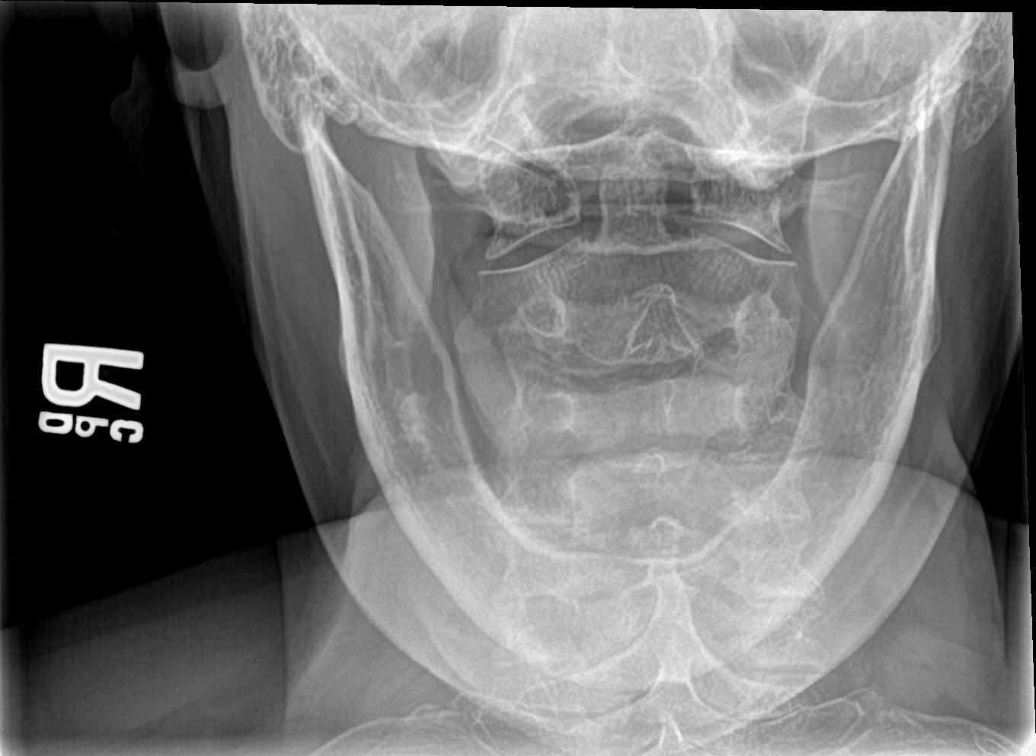

[c-spine swimmers trauma]
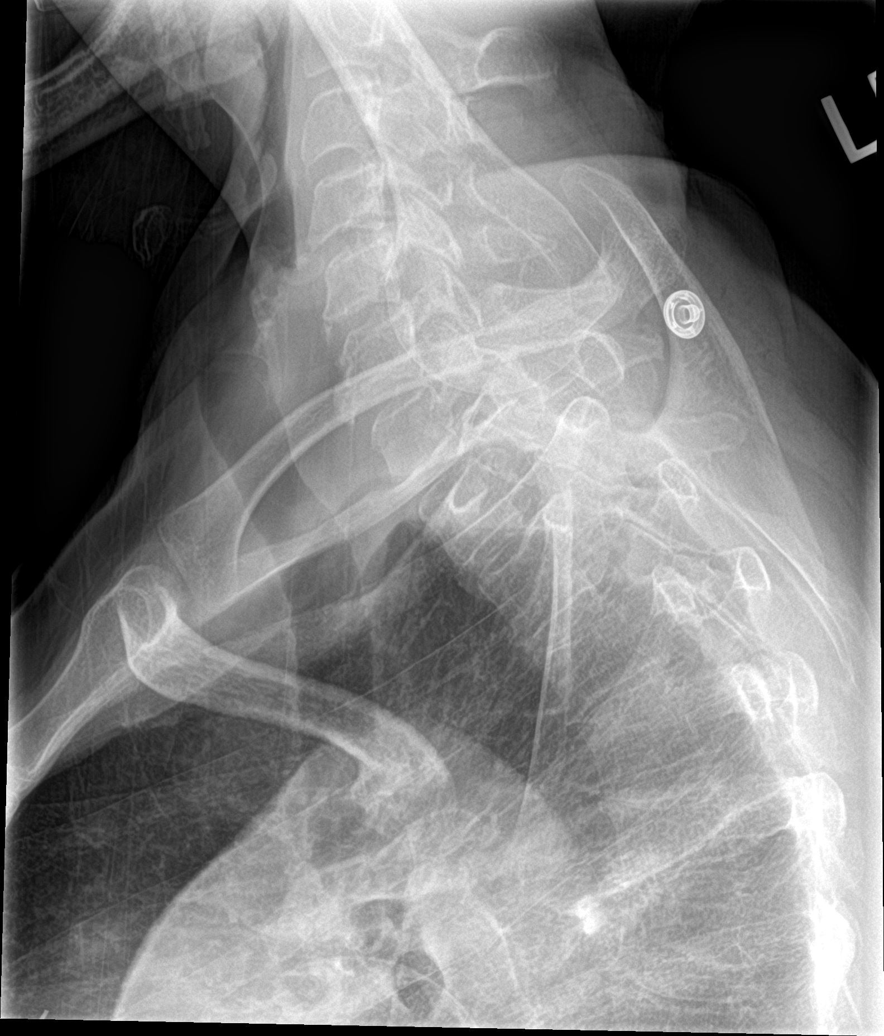

[6 of 6 positions shown; findings below may reference images not displayed]

FINDINGS: There is no evidence of fracture or subluxation. Vertebral bodies
demonstrate normal height and alignment. Intervertebral disc spaces
are preserved. Small anterior osteophytes are noted along the lower
cervical spine. Prevertebral soft tissues are within normal limits.
The provided odontoid view demonstrates no significant abnormality.

The visualized lung apices are clear.
IMPRESSION: No evidence of fracture or subluxation along the cervical spine.

## 2015-09-26 MED ORDER — SODIUM CHLORIDE 0.9 % IV BOLUS (SEPSIS)
1000.0000 mL | Freq: Once | INTRAVENOUS | Status: AC
  Administered 2015-09-26: 1000 mL via INTRAVENOUS

## 2015-09-26 MED ORDER — NAPROXEN 250 MG PO TABS: ORAL_TABLET | ORAL | 0 refills | Status: DC

## 2015-09-26 MED ORDER — SODIUM CHLORIDE 0.9 % IV BOLUS (SEPSIS): 500.0000 mL | Freq: Once | INTRAVENOUS | Status: DC

## 2015-09-26 MED ORDER — NAPROXEN 250 MG PO TABS
250.0000 mg | ORAL_TABLET | Freq: Once | ORAL | Status: AC
  Administered 2015-09-26: 250 mg via ORAL
  Filled 2015-09-26: qty 1

## 2015-09-26 MED ORDER — METHOCARBAMOL 500 MG PO TABS
500.0000 mg | ORAL_TABLET | Freq: Once | ORAL | Status: AC
  Administered 2015-09-26: 500 mg via ORAL
  Filled 2015-09-26: qty 1

## 2015-09-26 MED ORDER — ONDANSETRON 4 MG PO TBDP: 4.0000 mg | ORAL_TABLET | Freq: Three times a day (TID) | ORAL | 0 refills | Status: DC | PRN

## 2015-09-26 MED ORDER — METHOCARBAMOL 500 MG PO TABS: ORAL_TABLET | ORAL | 0 refills | Status: DC

## 2015-09-26 NOTE — Discharge Instructions (Signed)
Drink plenty of fluids (clear liquids) this morning, then start the bland diet such as toast, jello, crackers or [employer] chicken noodle soup . Use the zofran for nausea or vomiting. Take imodium OTC for diarrhea. Avoid mild products until the diarrhea is gone. Recheck if you get worse. Use ice and heat on your neck for comfort. Take the medications as prescribed.  Recheck if you get a fever, abdominal pain, the vomiting or diarrhea continue or you feel worse.

## 2015-09-26 NOTE — ED Provider Notes (Signed)
[facility] DEPT Provider Note   CSN: [id number] Arrival date & time: 09/25/15  2355  By signing my name below, I, Kimberly Mccormick, attest that this documentation has been prepared under the direction and in the presence of Rolland Porter, MD. Electronically signed, Kimberly Mccormick, ED Scribe. 09/26/15. 12:45 AM.  Time Seen 00:14 AM  History   Chief Complaint Chief Complaint  Patient presents with  . Arm Pain    HPI HPI Comments: Kimberly Mccormick is a 65 y.o. female who presents to the Emergency Department complaining of left shoulder pain that radiates up to the left side of her neck and down into her arm, that started around 2130 this evening. This pain started while she was sitting on her sofa playing on her phone. She denies Hx of similar pain. Pt denies any exacerbating or alleviating factors to the pain. She has not taken any OTC medication in attempt to relieve her symptoms. She also complains of intermittent achy left frontal headache throughout the day today that she states is mild and rates it as a "1/10". Pt further reports nausea and more than 5 episodes of vomiting this morning. She believes she ate a sloppy joe which upset her stomach around noon. She does not currently feel nauseated. Pt states that she also had 3 normal bowel movements today with the vomiting. She reports no new exertional activities recently. Pt is a Environmental consultant at [employer] and is frequently around children but denies any known sick contact or having to lift up [place]. Not a smoker or frequent etOH user. She denies any numbness, tingling, blurred vision, chest pain, shortness of breath. She did have lower abdominal pain earlier today with the vomiting and diarrhea, but not now. Has only drank a little water since then without vomiting.    The history is provided by the patient. No language interpreter was used.   PCP Dr Pleas Koch  Past Medical History:  Diagnosis Date  . Genital herpes   . GERD (gastroesophageal  reflux disease)   . Hypertension   . Sinus infection 01/04/14    Patient Active Problem List   Diagnosis Date Noted  . Essential hypertension 03/21/2015  . High cholesterol 03/21/2015  . Genital herpes 03/21/2015  . Fecal urgency 03/21/2015  . GERD (gastroesophageal reflux disease) 03/21/2015    Past Surgical History:  Procedure Laterality Date  . APPENDECTOMY    . CATARACT EXTRACTION W/PHACO Left 01/10/2014   Procedure: CATARACT EXTRACTION PHACO AND INTRAOCULAR LENS PLACEMENT ; CDE:  4.94;  Surgeon: Williams Che, MD;  Location: [facility];  Service: Ophthalmology;  Laterality: Left;  . CESAREAN SECTION    . CHOLECYSTECTOMY    . NASAL SINUS SURGERY    . TONSILLECTOMY      OB History    No data available       Home Medications    Prior to Admission medications   Medication Sig Start Date End Date Taking? Authorizing Provider  acyclovir (ZOVIRAX) 400 MG tablet Take 400 mg by mouth 2 (two) times daily.   Yes Historical Provider, MD  B Complex-C (SUPER B COMPLEX PO) Take by mouth.   Yes Historical Provider, MD  cetirizine (ZYRTEC) 10 MG tablet Take 10 mg by mouth daily.   Yes Historical Provider, MD  Cholecalciferol (VITAMIN D) 2000 UNITS tablet Take 2,000 Units by mouth daily.   Yes Historical Provider, MD  METOPROLOL SUCCINATE ER PO Take 25 mg by mouth daily.   Yes Historical Provider, MD  nitroGLYCERIN (  NITROSTAT) 0.4 MG SL tablet Place 0.4 mg under the tongue every 5 (five) minutes as needed for chest pain.   Yes Historical Provider, MD  omeprazole (PRILOSEC) 40 MG capsule Take 40 mg by mouth daily.   Yes Historical Provider, MD  Red Yeast Rice Extract (RED YEAST RICE PO) Take by mouth.   Yes Historical Provider, MD  zolpidem (AMBIEN) 10 MG tablet Take 10 mg by mouth at bedtime as needed for sleep.   Yes Historical Provider, MD  dicyclomine (BENTYL) 10 MG capsule Take 1 capsule (10 mg total) by mouth 3 (three) times daily before meals. 03/21/15   Butch Penny, NP    methocarbamol (ROBAXIN) 500 MG tablet Take 1 or 2 po Q 6hrs for muscle soreness 09/26/15   Rolland Porter, MD  naproxen (NAPROSYN) 250 MG tablet Take 1 po BID with food prn pain 09/26/15   Rolland Porter, MD    Family History History reviewed. No pertinent family history.  Social History Social History  Substance Use Topics  . Smoking status: Never Smoker  . Smokeless tobacco: Never Used  . Alcohol use No  employed   Allergies   Penicillins   Review of Systems Review of Systems  Respiratory: Negative for shortness of breath.   Cardiovascular: Negative for chest pain.  Gastrointestinal: Positive for nausea (subsided earlier today) and vomiting.  Musculoskeletal: Positive for arthralgias (left shoulder) and neck pain (left sided).  Neurological: Negative for numbness.  All other systems reviewed and are negative.    Physical Exam Updated Vital Signs BP 183/86 (BP Location: Right Arm)   Pulse 62   Temp 97.8 F (36.6 C) (Oral)   Resp 20   Ht 5\' 3"  (1.6 m)   Wt 128 lb (58.1 kg)   SpO2 100%   BMI 22.67 kg/m   Vital signs normal except hypertension   Physical Exam  Constitutional: She is oriented to person, place, and time. She appears well-developed and well-nourished.  Non-toxic appearance. She does not appear ill. No distress.  HENT:  Head: Normocephalic and atraumatic.  Right Ear: External ear normal.  Left Ear: External ear normal.  Nose: Nose normal. No mucosal edema or rhinorrhea.  Mouth/Throat: Oropharynx is clear and moist and mucous membranes are normal. No dental abscesses or uvula swelling.  Eyes: Conjunctivae and EOM are normal. Pupils are equal, round, and reactive to light.  Neck: Normal range of motion and full passive range of motion without pain. Neck supple.  Cardiovascular: Normal rate, regular rhythm and normal heart sounds.  Exam reveals no gallop and no friction rub.   No murmur heard. Pulmonary/Chest: Effort normal and breath sounds normal. No  respiratory distress. She has no wheezes. She has no rhonchi. She has no rales. She exhibits no tenderness and no crepitus.  Abdominal: Soft. Normal appearance and bowel sounds are normal. She exhibits no distension. There is no tenderness. There is no rebound and no guarding.  Musculoskeletal: Normal range of motion. She exhibits no edema or tenderness.  Non-tender neck. Non-tender shoulder. No pain with ROM.  Neurological: She is alert and oriented to person, place, and time. She has normal strength. No cranial nerve deficit.  Skin: Skin is warm, dry and intact. No rash noted. No erythema. No pallor.  Psychiatric: She has a normal mood and affect. Her speech is normal and behavior is normal. Her mood appears not anxious.  Nursing note and vitals reviewed.    ED Treatments / Results  Labs (all labs ordered are  listed, but only abnormal results are displayed) Results for orders placed or performed during the hospital encounter of 09/25/15  Comprehensive metabolic panel  Result Value Ref Range   Sodium 140 135 - 145 mmol/L   Potassium 3.5 3.5 - 5.1 mmol/L   Chloride 104 101 - 111 mmol/L   CO2 26 22 - 32 mmol/L   Glucose, Bld 102 (H) 65 - 99 mg/dL   BUN 14 6 - 20 mg/dL   Creatinine, Ser 1.08 (H) 0.44 - 1.00 mg/dL   Calcium 9.4 8.9 - 10.3 mg/dL   Total Protein 7.7 6.5 - 8.1 g/dL   Albumin 4.1 3.5 - 5.0 g/dL   AST 20 15 - 41 U/L   ALT 14 14 - 54 U/L   Alkaline Phosphatase 90 38 - 126 U/L   Total Bilirubin 0.9 0.3 - 1.2 mg/dL   GFR calc non Af Amer 52 (L) >60 mL/min   GFR calc Af Amer >60 >60 mL/min   Anion gap 10 5 - 15  CBC with Differential  Result Value Ref Range   WBC 8.2 4.0 - 10.5 K/uL   RBC 4.61 3.87 - 5.11 MIL/uL   Hemoglobin 12.1 12.0 - 15.0 g/dL   HCT 36.4 36.0 - 46.0 %   MCV 79.0 78.0 - 100.0 fL   MCH 26.2 26.0 - 34.0 pg   MCHC 33.2 30.0 - 36.0 g/dL   RDW 13.1 11.5 - 15.5 %   Platelets 250 150 - 400 K/uL   Neutrophils Relative % 38 %   Neutro Abs 3.1 1.7 - 7.7  K/uL   Lymphocytes Relative 44 %   Lymphs Abs 3.6 0.7 - 4.0 K/uL   Monocytes Relative 5 %   Monocytes Absolute 0.4 0.1 - 1.0 K/uL   Eosinophils Relative 12 %   Eosinophils Absolute 1.0 (H) 0.0 - 0.7 K/uL   Basophils Relative 1 %   Basophils Absolute 0.1 0.0 - 0.1 K/uL  Troponin I  Result Value Ref Range   Troponin I <0.03 <0.03 ng/mL   Laboratory interpretation all normal except mild renal insufficiency since Dec c/w dehydration that should resolve with the IV fluids.     EKG  EKG Interpretation  Date/Time:  Tuesday September 26 2015 00:35:04 EDT Ventricular Rate:  57 PR Interval:    QRS Duration: 87 QT Interval:  514 QTC Calculation: 501 R Axis:   29 Text Interpretation:  Sinus rhythm Abnormal R-wave progression, early transition Prolonged QT interval No significant change since last tracing 11 Jan 2015 Confirmed by Esdras Delair  MD-I, Darral Rishel ([id number]) on 09/26/2015 12:38:31 AM       Radiology Dg Cervical Spine Complete  Result Date: 09/26/2015 CLINICAL DATA:  Acute onset of left shoulder pain, radiating to the neck. Initial encounter. EXAM: CERVICAL SPINE - COMPLETE 4+ VIEW COMPARISON:  None. FINDINGS: There is no evidence of fracture or subluxation. Vertebral bodies demonstrate normal height and alignment. Intervertebral disc spaces are preserved. Small anterior osteophytes are noted along the lower cervical spine. Prevertebral soft tissues are within normal limits. The provided odontoid view demonstrates no significant abnormality. The visualized lung apices are clear. IMPRESSION: No evidence of fracture or subluxation along the cervical spine. Electronically Signed   By: Garald Balding M.D.   On: 09/26/2015 01:27   Dg Shoulder Left  Result Date: 09/26/2015 CLINICAL DATA:  Acute onset of left shoulder pain, radiating to the neck. Initial encounter. EXAM: LEFT SHOULDER - 2+ VIEW COMPARISON:  None. FINDINGS: There is no evidence of  fracture or dislocation. The left humeral head is seated  within the glenoid fossa. Calcification overlying the left humeral head may reflect calcific tendinitis. The acromioclavicular joint is unremarkable in appearance. No significant soft tissue abnormalities are seen. The visualized portions of the left lung are clear. IMPRESSION: 1. No evidence of fracture or dislocation. 2. Calcification overlying the left humeral head may reflect calcific tendinitis. Electronically Signed   By: Garald Balding M.D.   On: 09/26/2015 01:28    Procedures Procedures (including critical care time)  Medications Ordered in ED Medications  sodium chloride 0.9 % bolus 500 mL (not administered)  sodium chloride 0.9 % bolus 1,000 mL (1,000 mLs Intravenous New Bag/Given 09/26/15 0226)  naproxen (NAPROSYN) tablet 250 mg (250 mg Oral Given 09/26/15 0224)  methocarbamol (ROBAXIN) tablet 500 mg (500 mg Oral Given 09/26/15 0224)     Initial Impression / Assessment and Plan / ED Course  I have reviewed the triage vital signs and the nursing notes.  Pertinent labs & imaging results that were available during my care of the patient were reviewed by me and considered in my medical decision making (see chart for details).  Clinical Course  DIAGNOSTIC STUDIES: Oxygen Saturation is 100% on RA, normal by my interpretation.  COORDINATION OF CARE: 12:39 AM-Will order fluids. Discussed treatment plan with pt at bedside and pt agreed to plan.   Patient has had her IV fluids and has been drinking oral fluids and eating crackers without difficulty.  4 AM discussed patient's test results. She did agree she was concerned she was having a heart attack and she feels reassured. We discussed her x-ray results. She denies pain on abduction of her left shoulder. She was advised if she starts getting discomfort there to let her doctor know and they can refer 10 orthopedist. At this point her neck pain in her shoulder pain is improved with the medications they gave her. She will be discharged home  with similar.     Final Clinical Impressions(s) / ED Diagnoses   Final diagnoses:  Neck pain on left side  Left arm pain  Vomiting and diarrhea  Dehydration    New Prescriptions New Prescriptions   METHOCARBAMOL (ROBAXIN) 500 MG TABLET    Take 1 or 2 po Q 6hrs for muscle soreness   NAPROXEN (NAPROSYN) 250 MG TABLET    Take 1 po BID with food prn pain    I personally performed the services described in this documentation, which was scribed in my presence. The recorded information has been reviewed and considered.  Rolland Porter, MD, Barbette Or, MD 09/26/15 [id number]

## 2015-09-26 NOTE — ED Triage Notes (Signed)
Pt c/o left arm pain that radiates up to her neck and a slight headache; sx started 1.5 hr pta

## 2015-09-29 ENCOUNTER — Encounter (INDEPENDENT_AMBULATORY_CARE_PROVIDER_SITE_OTHER): Payer: Self-pay

## 2016-01-18 DIAGNOSIS — M7711 Lateral epicondylitis, right elbow: Secondary | ICD-10-CM | POA: Diagnosis not present

## 2016-01-18 DIAGNOSIS — Z6823 Body mass index (BMI) 23.0-23.9, adult: Secondary | ICD-10-CM | POA: Diagnosis not present

## 2016-02-05 DIAGNOSIS — G4709 Other insomnia: Secondary | ICD-10-CM | POA: Diagnosis not present

## 2016-02-05 DIAGNOSIS — Z6823 Body mass index (BMI) 23.0-23.9, adult: Secondary | ICD-10-CM | POA: Diagnosis not present

## 2016-02-05 DIAGNOSIS — I10 Essential (primary) hypertension: Secondary | ICD-10-CM | POA: Diagnosis not present

## 2016-02-05 DIAGNOSIS — M7711 Lateral epicondylitis, right elbow: Secondary | ICD-10-CM | POA: Diagnosis not present

## 2016-02-05 DIAGNOSIS — K649 Unspecified hemorrhoids: Secondary | ICD-10-CM | POA: Diagnosis not present

## 2016-02-05 DIAGNOSIS — D509 Iron deficiency anemia, unspecified: Secondary | ICD-10-CM | POA: Diagnosis not present

## 2016-03-20 DIAGNOSIS — D509 Iron deficiency anemia, unspecified: Secondary | ICD-10-CM | POA: Diagnosis not present

## 2016-03-20 DIAGNOSIS — I10 Essential (primary) hypertension: Secondary | ICD-10-CM | POA: Diagnosis not present

## 2016-03-20 DIAGNOSIS — E782 Mixed hyperlipidemia: Secondary | ICD-10-CM | POA: Diagnosis not present

## 2016-06-06 DIAGNOSIS — J0101 Acute recurrent maxillary sinusitis: Secondary | ICD-10-CM | POA: Diagnosis not present

## 2016-06-06 DIAGNOSIS — Z6823 Body mass index (BMI) 23.0-23.9, adult: Secondary | ICD-10-CM | POA: Diagnosis not present

## 2016-10-01 DIAGNOSIS — E559 Vitamin D deficiency, unspecified: Secondary | ICD-10-CM | POA: Diagnosis not present

## 2016-10-01 DIAGNOSIS — D509 Iron deficiency anemia, unspecified: Secondary | ICD-10-CM | POA: Diagnosis not present

## 2016-10-01 DIAGNOSIS — E782 Mixed hyperlipidemia: Secondary | ICD-10-CM | POA: Diagnosis not present

## 2016-10-01 DIAGNOSIS — I10 Essential (primary) hypertension: Secondary | ICD-10-CM | POA: Diagnosis not present

## 2016-10-01 DIAGNOSIS — K219 Gastro-esophageal reflux disease without esophagitis: Secondary | ICD-10-CM | POA: Diagnosis not present

## 2016-10-11 DIAGNOSIS — Z1231 Encounter for screening mammogram for malignant neoplasm of breast: Secondary | ICD-10-CM | POA: Diagnosis not present

## 2017-01-01 DIAGNOSIS — Z79899 Other long term (current) drug therapy: Secondary | ICD-10-CM | POA: Diagnosis not present

## 2017-01-01 DIAGNOSIS — M25551 Pain in right hip: Secondary | ICD-10-CM | POA: Diagnosis not present

## 2017-01-01 DIAGNOSIS — K219 Gastro-esophageal reflux disease without esophagitis: Secondary | ICD-10-CM | POA: Diagnosis not present

## 2017-01-01 DIAGNOSIS — I10 Essential (primary) hypertension: Secondary | ICD-10-CM | POA: Diagnosis not present

## 2017-01-01 DIAGNOSIS — M5431 Sciatica, right side: Secondary | ICD-10-CM | POA: Diagnosis not present

## 2017-01-27 ENCOUNTER — Ambulatory Visit (INDEPENDENT_AMBULATORY_CARE_PROVIDER_SITE_OTHER): Payer: Medicare Other | Admitting: Otolaryngology

## 2017-01-27 DIAGNOSIS — H9011 Conductive hearing loss, unilateral, right ear, with unrestricted hearing on the contralateral side: Secondary | ICD-10-CM | POA: Diagnosis not present

## 2017-01-27 DIAGNOSIS — H6983 Other specified disorders of Eustachian tube, bilateral: Secondary | ICD-10-CM

## 2017-01-27 DIAGNOSIS — H6123 Impacted cerumen, bilateral: Secondary | ICD-10-CM

## 2017-01-27 DIAGNOSIS — H903 Sensorineural hearing loss, bilateral: Secondary | ICD-10-CM | POA: Diagnosis not present

## 2017-01-27 DIAGNOSIS — H7202 Central perforation of tympanic membrane, left ear: Secondary | ICD-10-CM | POA: Diagnosis not present

## 2017-01-29 ENCOUNTER — Other Ambulatory Visit: Payer: Self-pay | Admitting: Otolaryngology

## 2017-01-29 ENCOUNTER — Encounter (HOSPITAL_BASED_OUTPATIENT_CLINIC_OR_DEPARTMENT_OTHER): Payer: Self-pay | Admitting: *Deleted

## 2017-01-29 ENCOUNTER — Other Ambulatory Visit: Payer: Self-pay

## 2017-01-31 ENCOUNTER — Other Ambulatory Visit: Payer: Self-pay

## 2017-01-31 ENCOUNTER — Encounter (HOSPITAL_COMMUNITY)
Admission: RE | Admit: 2017-01-31 | Discharge: 2017-01-31 | Disposition: A | Discharge status: Home / Self Care | Payer: Managed Care, Other (non HMO) | Source: Ambulatory Visit | Attending: Otolaryngology | Admitting: Otolaryngology

## 2017-01-31 DIAGNOSIS — I10 Essential (primary) hypertension: Secondary | ICD-10-CM | POA: Insufficient documentation

## 2017-01-31 DIAGNOSIS — Z01818 Encounter for other preprocedural examination: Secondary | ICD-10-CM | POA: Diagnosis not present

## 2017-02-04 ENCOUNTER — Encounter (HOSPITAL_BASED_OUTPATIENT_CLINIC_OR_DEPARTMENT_OTHER)
Admission: RE | Disposition: A | Discharge status: Home / Self Care | Payer: Self-pay | Source: Ambulatory Visit | Attending: Otolaryngology

## 2017-02-04 ENCOUNTER — Encounter (HOSPITAL_BASED_OUTPATIENT_CLINIC_OR_DEPARTMENT_OTHER): Payer: Self-pay

## 2017-02-04 ENCOUNTER — Ambulatory Visit (HOSPITAL_BASED_OUTPATIENT_CLINIC_OR_DEPARTMENT_OTHER): Payer: Managed Care, Other (non HMO) | Admitting: Anesthesiology

## 2017-02-04 ENCOUNTER — Other Ambulatory Visit: Payer: Self-pay

## 2017-02-04 ENCOUNTER — Ambulatory Visit (HOSPITAL_BASED_OUTPATIENT_CLINIC_OR_DEPARTMENT_OTHER)
Admission: RE | Admit: 2017-02-04 | Discharge: 2017-02-04 | Disposition: A | Discharge status: Home / Self Care | Payer: Managed Care, Other (non HMO) | Source: Ambulatory Visit | Attending: Otolaryngology | Admitting: Otolaryngology

## 2017-02-04 DIAGNOSIS — H9011 Conductive hearing loss, unilateral, right ear, with unrestricted hearing on the contralateral side: Secondary | ICD-10-CM | POA: Insufficient documentation

## 2017-02-04 DIAGNOSIS — H6991 Unspecified Eustachian tube disorder, right ear: Secondary | ICD-10-CM | POA: Diagnosis not present

## 2017-02-04 DIAGNOSIS — H6691 Otitis media, unspecified, right ear: Secondary | ICD-10-CM | POA: Diagnosis not present

## 2017-02-04 DIAGNOSIS — Z79899 Other long term (current) drug therapy: Secondary | ICD-10-CM | POA: Insufficient documentation

## 2017-02-04 DIAGNOSIS — H6521 Chronic serous otitis media, right ear: Secondary | ICD-10-CM | POA: Diagnosis not present

## 2017-02-04 DIAGNOSIS — K219 Gastro-esophageal reflux disease without esophagitis: Secondary | ICD-10-CM | POA: Diagnosis not present

## 2017-02-04 DIAGNOSIS — I10 Essential (primary) hypertension: Secondary | ICD-10-CM | POA: Insufficient documentation

## 2017-02-04 DIAGNOSIS — H6981 Other specified disorders of Eustachian tube, right ear: Secondary | ICD-10-CM | POA: Insufficient documentation

## 2017-02-04 DIAGNOSIS — H9201 Otalgia, right ear: Secondary | ICD-10-CM | POA: Diagnosis not present

## 2017-02-04 DIAGNOSIS — H748X1 Other specified disorders of right middle ear and mastoid: Secondary | ICD-10-CM | POA: Diagnosis not present

## 2017-02-04 HISTORY — PX: MYRINGOTOMY WITH TUBE PLACEMENT: SHX5663

## 2017-02-04 HISTORY — DX: Otitis media, unspecified, unspecified ear: H66.90

## 2017-02-04 SURGERY — MYRINGOTOMY WITH TUBE PLACEMENT
Anesthesia: General | Site: Ear | Laterality: Right

## 2017-02-04 MED ORDER — LACTATED RINGERS IV SOLN
INTRAVENOUS | Status: DC
  Administered 2017-02-04: 07:00:00 via INTRAVENOUS

## 2017-02-04 MED ORDER — MIDAZOLAM HCL 2 MG/2ML IJ SOLN
1.0000 mg | INTRAMUSCULAR | Status: DC | PRN
  Administered 2017-02-04: 1 mg via INTRAVENOUS

## 2017-02-04 MED ORDER — HYDROMORPHONE HCL 1 MG/ML IJ SOLN
INTRAMUSCULAR | Status: AC
  Filled 2017-02-04: qty 0.5

## 2017-02-04 MED ORDER — CIPROFLOXACIN-FLUOCINOLONE PF 0.3-0.025 % OT SOLN
OTIC | Status: DC | PRN
Start: 2017-02-04 — End: 2017-02-04
  Administered 2017-02-04: 0.25 mL via OTIC

## 2017-02-04 MED ORDER — HYDROMORPHONE HCL 1 MG/ML IJ SOLN
0.2500 mg | INTRAMUSCULAR | Status: DC | PRN
  Administered 2017-02-04: 0.25 mg via INTRAVENOUS

## 2017-02-04 MED ORDER — MIDAZOLAM HCL 2 MG/2ML IJ SOLN
INTRAMUSCULAR | Status: AC
  Filled 2017-02-04: qty 2

## 2017-02-04 MED ORDER — OXYMETAZOLINE HCL 0.05 % NA SOLN
NASAL | Status: DC | PRN
  Administered 2017-02-04: 1 via TOPICAL

## 2017-02-04 MED ORDER — LIDOCAINE 2% (20 MG/ML) 5 ML SYRINGE
INTRAMUSCULAR | Status: DC | PRN
  Administered 2017-02-04: 100 mg via INTRAVENOUS

## 2017-02-04 MED ORDER — PROPOFOL 10 MG/ML IV BOLUS
INTRAVENOUS | Status: DC | PRN
  Administered 2017-02-04: 150 mg via INTRAVENOUS

## 2017-02-04 MED ORDER — DEXAMETHASONE SODIUM PHOSPHATE 10 MG/ML IJ SOLN
INTRAMUSCULAR | Status: AC
  Filled 2017-02-04: qty 1

## 2017-02-04 MED ORDER — ONDANSETRON HCL 4 MG/2ML IJ SOLN
INTRAMUSCULAR | Status: AC
  Filled 2017-02-04: qty 2

## 2017-02-04 MED ORDER — LIDOCAINE 2% (20 MG/ML) 5 ML SYRINGE
INTRAMUSCULAR | Status: AC
  Filled 2017-02-04: qty 5

## 2017-02-04 MED ORDER — FENTANYL CITRATE (PF) 100 MCG/2ML IJ SOLN
INTRAMUSCULAR | Status: AC
  Filled 2017-02-04: qty 2

## 2017-02-04 MED ORDER — SUCCINYLCHOLINE CHLORIDE 200 MG/10ML IV SOSY
PREFILLED_SYRINGE | INTRAVENOUS | Status: AC
  Filled 2017-02-04: qty 10

## 2017-02-04 MED ORDER — ONDANSETRON HCL 4 MG/2ML IJ SOLN: 4.0000 mg | Freq: Once | INTRAMUSCULAR | Status: DC | PRN

## 2017-02-04 MED ORDER — FENTANYL CITRATE (PF) 100 MCG/2ML IJ SOLN
50.0000 ug | INTRAMUSCULAR | Status: DC | PRN
  Administered 2017-02-04: 50 ug via INTRAVENOUS

## 2017-02-04 MED ORDER — SCOPOLAMINE 1 MG/3DAYS TD PT72: 1.0000 | MEDICATED_PATCH | Freq: Once | TRANSDERMAL | Status: DC | PRN

## 2017-02-04 MED ORDER — MEPERIDINE HCL 25 MG/ML IJ SOLN: 6.2500 mg | INTRAMUSCULAR | Status: DC | PRN

## 2017-02-04 MED ORDER — PROPOFOL 10 MG/ML IV BOLUS
INTRAVENOUS | Status: AC
  Filled 2017-02-04: qty 20

## 2017-02-04 SURGICAL SUPPLY — 17 items
BALL CTTN LRG ABS STRL LF (GAUZE/BANDAGES/DRESSINGS) ×1
BLADE MYRINGOTOMY 45DEG STRL (BLADE) ×3 IMPLANT
CANISTER SUCT 1200ML W/VALVE (MISCELLANEOUS) ×3 IMPLANT
COTTONBALL LRG STERILE PKG (GAUZE/BANDAGES/DRESSINGS) ×3 IMPLANT
GAUZE SPONGE 4X4 12PLY STRL LF (GAUZE/BANDAGES/DRESSINGS) IMPLANT
GLOVE BIOGEL PI IND STRL 7.0 (GLOVE) IMPLANT
GLOVE BIOGEL PI INDICATOR 7.0 (GLOVE) ×2
GLOVE SURG SS PI 7.0 STRL IVOR (GLOVE) ×2 IMPLANT
IV SET EXT 30 76VOL 4 MALE LL (IV SETS) ×3 IMPLANT
NS IRRIG 1000ML POUR BTL (IV SOLUTION) IMPLANT
PROS SHEEHY TY XOMED (OTOLOGIC RELATED)
TOWEL OR 17X24 6PK STRL BLUE (TOWEL DISPOSABLE) ×3 IMPLANT
TUBE CONNECTING 20'X1/4 (TUBING) ×1
TUBE CONNECTING 20X1/4 (TUBING) ×2 IMPLANT
TUBE EAR SHEEHY BUTTON 1.27 (OTOLOGIC RELATED) ×2 IMPLANT
TUBE EAR T MOD 1.32X4.8 BL (OTOLOGIC RELATED) ×1 IMPLANT
TUBE T ENT MOD 1.32X4.8 BL (OTOLOGIC RELATED) ×1

## 2017-02-04 NOTE — Anesthesia Preprocedure Evaluation (Signed)
Anesthesia Evaluation  Patient identified by MRN, date of birth, ID band Patient awake    Reviewed: Allergy & Precautions, NPO status , Patient's Chart, lab work & pertinent test results  Airway Mallampati: I  TM Distance: >3 FB Neck ROM: Full    Dental   Pulmonary    Pulmonary exam normal        Cardiovascular hypertension, Pt. on medications Normal cardiovascular exam     Neuro/Psych    GI/Hepatic GERD  Medicated and Controlled,  Endo/Other    Renal/GU      Musculoskeletal   Abdominal   Peds  Hematology   Anesthesia Other Findings   Reproductive/Obstetrics                             Anesthesia Physical Anesthesia Plan  ASA: II  Anesthesia Plan: General   Post-op Pain Management:    Induction: Intravenous  PONV Risk Score and Plan: 2 and Ondansetron and Treatment may vary due to age or medical condition  Airway Management Planned: Mask  Additional Equipment:   Intra-op Plan:   Post-operative Plan:   Informed Consent: I have reviewed the patients History and Physical, chart, labs and discussed the procedure including the risks, benefits and alternatives for the proposed anesthesia with the patient or authorized representative who has indicated his/her understanding and acceptance.     Plan Discussed with: CRNA and Surgeon  Anesthesia Plan Comments:         Anesthesia Quick Evaluation

## 2017-02-04 NOTE — Op Note (Signed)
DATE OF PROCEDURE:  02/04/2017                              OPERATIVE REPORT  SURGEON:  Leta Baptist, MD  PREOPERATIVE DIAGNOSES: 1. Right eustachian tube dysfunction. 2. Right ear conductive hearing loss. 3. Right middle ear effusion.  POSTOPERATIVE DIAGNOSES: 1. Right eustachian tube dysfunction. 2. Right ear conductive hearing loss. 3. Right middle ear effusion.  PROCEDURE PERFORMED: 1) Right myringotomy and tube placement.          ANESTHESIA:  General facemask anesthesia.  COMPLICATIONS:  None.  ESTIMATED BLOOD LOSS:  Minimal.  INDICATION FOR PROCEDURE:   Kimberly Mccormick is a 67 y.o. female with a history of bilateral eustachian tube dysfunction and chronic middle ear effusion. She previously underwent bilateral myringotomy and T-tube placement. The right tube was recently dislodged. She was noted to have recurrent right middle ear effusion and significant conductive hearing loss. Based on the above findings, the decision was made for the patient to undergo the revision right myringotomy and tube placement procedure. Likelihood of success in reducing symptoms was also discussed.  The risks, benefits, alternatives, and details of the procedure were discussed with the mother.  Questions were invited and answered.  Informed consent was obtained.  DESCRIPTION:  The patient was taken to the operating room and placed supine on the operating table.  General facemask anesthesia was administered by the anesthesiologist.  Under the operating microscope, the right ear canal was cleaned of all cerumen.  The tympanic membrane was noted to be intact but mildly retracted.  A standard myringotomy incision was made at the anterior-inferior quadrant on the tympanic membrane.  A copious  amount of serous  fluid was suctioned from behind the tympanic membrane. A T tube was placed, followed by antibiotic eardrops in the ear canal.  Examination of the left ear showed the T tube to be in place and patent.  The care  of the patient was turned over to the anesthesiologist.  The patient was awakened from anesthesia without difficulty.  The patient was transferred to the recovery room in good condition.  OPERATIVE FINDINGS:  A copious amount of serous effusion was noted within the right middle ear space.  SPECIMEN:  None.  FOLLOWUP CARE:  The patient will be placed on Otovel eardrops 1 vial right ear b.i.d..  The patient will follow up in my office in approximately 4 weeks.  Kimberly Mccormick 02/04/2017

## 2017-02-04 NOTE — Anesthesia Postprocedure Evaluation (Signed)
Anesthesia Post Note  Patient: Kimberly Mccormick  Procedure(s) Performed: REVISION OF RIGHT MYRINGOTOMY WITH TUBE PLACEMENT, WITH EXAM OF LEFT EAR (Right Ear)     Patient location during evaluation: PACU Anesthesia Type: General Level of consciousness: awake and alert Pain management: pain level controlled Vital Signs Assessment: post-procedure vital signs reviewed and stable Respiratory status: spontaneous breathing, nonlabored ventilation, respiratory function stable and patient connected to nasal cannula oxygen Cardiovascular status: blood pressure returned to baseline and stable Postop Assessment: no apparent nausea or vomiting Anesthetic complications: no    Last Vitals:  Vitals:   02/04/17 0905 02/04/17 0928  BP:  (!) 152/82  Pulse: (!) 51 (!) 56  Resp: 12 20  Temp:  36.7 C  SpO2: 100% 100%    Last Pain:  Vitals:   02/04/17 0928  TempSrc:   PainSc: 1                  Hadiyah Maricle DAVID

## 2017-02-04 NOTE — Discharge Instructions (Addendum)
Post Anesthesia Home Care Instructions  Activity: Get plenty of rest for the remainder of the day. A responsible individual must stay with you for 24 hours following the procedure.  For the next 24 hours, DO NOT: -Drive a car -Paediatric nurse -Drink alcoholic beverages -Take any medication unless instructed by your physician -Make any legal decisions or sign important papers.  Meals: Start with liquid foods such as gelatin or soup. Progress to regular foods as tolerated. Avoid greasy, spicy, heavy foods. If nausea and/or vomiting occur, drink only clear liquids until the nausea and/or vomiting subsides. Call your physician if vomiting continues.  Special Instructions/Symptoms: Your throat may feel dry or sore from the anesthesia or the breathing tube placed in your throat during surgery. If this causes discomfort, gargle with warm salt water. The discomfort should disappear within 24 hours.  If you had a scopolamine patch placed behind your ear for the management of post- operative nausea and/or vomiting:  1. The medication in the patch is effective for 72 hours, after which it should be removed.  Wrap patch in a tissue and discard in the trash. Wash hands thoroughly with soap and water. 2. You may remove the patch earlier than 72 hours if you experience unpleasant side effects which may include dry mouth, dizziness or visual disturbances. 3. Avoid touching the patch. Wash your hands with soap and water after contact with the patch.   POSTOPERATIVE INSTRUCTIONS FOR PATIENTS HAVING MYRINGOTOMY AND TUBES  1. Please use the ear drops in each ear with a new tube as instructed. Use the drops as prescribed by your doctor, placing the drops into the outer opening of the ear canal with the head tilted to the opposite side. Place a clean piece of cotton into the ear after using drops. A small amount of blood tinged drainage is not uncommon for several days after the tubes are inserted. 2. Nausea  and vomiting may be expected the first 6 hours after surgery. Offer liquids initially. If there is no nausea, small light meals are usually best tolerated the day of surgery. A normal diet may be resumed once nausea has passed. 3. The patient may experience mild ear discomfort the day of surgery, which is usually relieved by Tylenol. 4. A small amount of clear or blood-tinged drainage from the ears may occur a few days after surgery. If this should persists or become thick, green, yellow, or foul smelling, please contact our office at [phone]. 5. If you see clear, green, or yellow drainage from your childs ear during colds, clean the outer ear gently with a soft, damp washcloth. Begin the prescribed ear drops (4 drops, twice a day) for one week, as previously instructed.  The drainage should stop within 48 hours after starting the ear drops. If the drainage continues or becomes yellow or green, please call our office. If your child develops a fever greater than 102 F, or has and persistent bleeding from the ear(s), please call us. 6. Try to avoid getting water in the ears. Swimming is permitted as long as there is no deep diving or swimming under water deeper than 3 feet. If you think water has gotten into the ear(s), either bathing or swimming, place 4 drops of the prescribed ear drops into the ear in question. We do recommend drops after swimming in the ocean, rivers, or lakes. 7. It is important for you to return for your scheduled appointment so that the status of the tubes can be determined.

## 2017-02-04 NOTE — H&P (Signed)
Cc: Right middle ear effusion, hearing loss  HPI: The patient is a 67 y/o female who presents today for evaluation of bilateral ear discomfort and right ear hearing loss. The patient is seen in consultation requested by Dr. Judd Lien. The patient has a history of chronic sinusitis and eustachian tube dysfunction. She had sinus surgery several years ago and also had tubes placed. The patient is unsure if the tubes have extruded. She complains mainly of right ear discomfort and hearing loss. This has been ongoing for several months. The patient states her right ear is clogged and she is not able to hear well. The patient uses Flonase prn and is on Cetirizine daily.    The patient's review of systems (constitutional, eyes, ENT, cardiovascular, respiratory, GI, musculoskeletal, skin, neurologic, psychiatric, endocrine, hematologic, allergic) is noted in the ROS questionnaire.  It is reviewed with the patient.   Family health history: None.  Major events: Sinus surgery X 2, appendectomy, gallbladder removed, bilateral cataracts extraction.  Ongoing medical problems: Hypertension, anemia, allergies, reflux.  Social history: The patient is single. She denies the use of tobacco, alcohol or illegal drugs.   Exam General: Communicates without difficulty, well nourished, no acute distress. Head: Normocephalic, no evidence injury, no tenderness, facial buttresses intact without stepoff. Eyes: PERRL, EOMI. No scleral icterus, conjunctivae clear. Neuro: CN II exam reveals vision grossly intact.  No nystagmus at any point of gaze. Bilateral cerumen impaction. Under the operating microscope, the cerumen is carefully removed with a combination of cerumen currette, alligator forceps, and suction catheters.  The right tube is also removed. After removal, the left T-tube is in place and patent. The right TM is healed and opacified. Nose: External evaluation reveals normal support and skin without lesions.  Dorsum is  intact.  Anterior rhinoscopy reveals healthy pink mucosa over anterior aspect of inferior turbinates and intact septum.  No purulence noted. Oral:  Oral cavity and oropharynx are intact, symmetric, without erythema or edema.  Mucosa is moist without lesions. Neck: Full range of motion without pain.  There is no significant lymphadenopathy.  No masses palpable.  Thyroid bed within normal limits to palpation.  Parotid glands and submandibular glands equal bilaterally without mass.  Trachea is midline. Neuro:  CN 2-12 grossly intact. Gait normal. Vestibular: No nystagmus at any point of gaze. The cerebellar examination is unremarkable.   AUDIOMETRIC TESTING: I have read and reviewed the audiometric test, which shows bilateral high-frequency sensorineural hearing loss with significant conductive loss noted on the right. The speech reception threshold is 70dB AD and 35dB AS. The discrimination score is 80% AD and 100% AS. The tympanogram is flat at high volume on the left and low volume on the right.   Assessment 1. Bilateral cerumen impaction with the right tube extruded.  2. The right TM is intact and opacified with middle ear effusion. It results in significant conductive hearing loss.  3. Bilateral high-frequency sensorineural hearing loss, consistent with presbycusis.  4. Chronic eustachian tube dysfunction.   Plan  1. Otomicroscopy with cerumen disimpaction and right tube removal. 2. The physical exam and hearing test findings are reviewed with the patient. 3. The treatment options for the middle ear effusion include continuing conservative observation with nasal steroid spray versus myringotomy and tube placement.  The risks, benefits, and details of the treatment modalities are discussed.   4. The patient would like to proceed with revision right myringotomy and tube placement. The procedure will be scheduled according to the patient's schedule.

## 2017-02-04 NOTE — Transfer of Care (Signed)
Immediate Anesthesia Transfer of Care Note  Patient: Kimberly Mccormick  Procedure(s) Performed: REVISION OF RIGHT MYRINGOTOMY WITH TUBE PLACEMENT, WITH EXAM OF LEFT EAR (Right Ear)  Patient Location: PACU  Anesthesia Type:General  Level of Consciousness: oriented and responds to stimulation  Airway & Oxygen Therapy: Patient Spontanous Breathing and Patient connected to face mask oxygen  Post-op Assessment: Report given to RN and Post -op Vital signs reviewed and stable  Post vital signs: Reviewed and stable  Last Vitals:  Vitals:   02/04/17 0653  BP: (!) 152/86  Pulse: 66  Resp: 18  Temp: 36.4 C  SpO2: 100%    Last Pain:  Vitals:   02/04/17 0653  TempSrc: Oral         Complications: No apparent anesthesia complications

## 2017-02-05 ENCOUNTER — Encounter (HOSPITAL_BASED_OUTPATIENT_CLINIC_OR_DEPARTMENT_OTHER): Payer: Self-pay | Admitting: Otolaryngology

## 2017-03-18 DIAGNOSIS — J329 Chronic sinusitis, unspecified: Secondary | ICD-10-CM | POA: Diagnosis not present

## 2017-03-18 DIAGNOSIS — Z6824 Body mass index (BMI) 24.0-24.9, adult: Secondary | ICD-10-CM | POA: Diagnosis not present

## 2017-03-18 DIAGNOSIS — J302 Other seasonal allergic rhinitis: Secondary | ICD-10-CM | POA: Diagnosis not present

## 2017-03-18 DIAGNOSIS — J069 Acute upper respiratory infection, unspecified: Secondary | ICD-10-CM | POA: Diagnosis not present

## 2017-04-07 DIAGNOSIS — E559 Vitamin D deficiency, unspecified: Secondary | ICD-10-CM | POA: Diagnosis not present

## 2017-04-07 DIAGNOSIS — K219 Gastro-esophageal reflux disease without esophagitis: Secondary | ICD-10-CM | POA: Diagnosis not present

## 2017-04-07 DIAGNOSIS — I10 Essential (primary) hypertension: Secondary | ICD-10-CM | POA: Diagnosis not present

## 2017-04-07 DIAGNOSIS — D509 Iron deficiency anemia, unspecified: Secondary | ICD-10-CM | POA: Diagnosis not present

## 2017-04-07 DIAGNOSIS — E782 Mixed hyperlipidemia: Secondary | ICD-10-CM | POA: Diagnosis not present

## 2017-04-15 ENCOUNTER — Emergency Department (HOSPITAL_COMMUNITY)
Admission: EM | Admit: 2017-04-15 | Discharge: 2017-04-15 | Disposition: A | Discharge status: Home / Self Care | Payer: Medicare HMO | Attending: Emergency Medicine | Admitting: Emergency Medicine

## 2017-04-15 ENCOUNTER — Other Ambulatory Visit: Payer: Self-pay

## 2017-04-15 ENCOUNTER — Encounter (HOSPITAL_COMMUNITY): Payer: Self-pay | Admitting: Emergency Medicine

## 2017-04-15 ENCOUNTER — Emergency Department (HOSPITAL_COMMUNITY): Payer: Medicare HMO

## 2017-04-15 DIAGNOSIS — Z79899 Other long term (current) drug therapy: Secondary | ICD-10-CM | POA: Diagnosis not present

## 2017-04-15 DIAGNOSIS — I10 Essential (primary) hypertension: Secondary | ICD-10-CM | POA: Diagnosis not present

## 2017-04-15 DIAGNOSIS — R0789 Other chest pain: Secondary | ICD-10-CM | POA: Diagnosis not present

## 2017-04-15 DIAGNOSIS — N179 Acute kidney failure, unspecified: Secondary | ICD-10-CM | POA: Insufficient documentation

## 2017-04-15 DIAGNOSIS — R079 Chest pain, unspecified: Secondary | ICD-10-CM | POA: Diagnosis not present

## 2017-04-15 HISTORY — DX: Anemia, unspecified: D64.9

## 2017-04-15 LAB — BASIC METABOLIC PANEL
Anion gap: 8 (ref 5–15)
BUN: 12 mg/dL (ref 6–20)
CALCIUM: 9.3 mg/dL (ref 8.9–10.3)
CHLORIDE: 106 mmol/L (ref 101–111)
CO2: 28 mmol/L (ref 22–32)
CREATININE: 2.23 mg/dL — AB (ref 0.44–1.00)
GFR calc non Af Amer: 21 mL/min — ABNORMAL LOW (ref >60)
GFR, EST AFRICAN AMERICAN: 25 mL/min — AB (ref >60)
Glucose, Bld: 108 mg/dL — ABNORMAL HIGH (ref 65–99)
Potassium: 3.6 mmol/L (ref 3.5–5.1)
Sodium: 142 mmol/L (ref 135–145)

## 2017-04-15 LAB — CBC
HCT: 36 % (ref 36.0–46.0)
Hemoglobin: 11.6 g/dL — ABNORMAL LOW (ref 12.0–15.0)
MCH: 25.7 pg — AB (ref 26.0–34.0)
MCHC: 32.2 g/dL (ref 30.0–36.0)
MCV: 79.8 fL (ref 78.0–100.0)
PLATELETS: 247 10*3/uL (ref 150–400)
RBC: 4.51 MIL/uL (ref 3.87–5.11)
RDW: 13.4 % (ref 11.5–15.5)
WBC: 6.4 10*3/uL (ref 4.0–10.5)

## 2017-04-15 LAB — TROPONIN I

## 2017-04-15 IMAGING — DX DG CHEST 2V
2 series · 2 of 2 positions shown · non-contrast
Comparison: [DATE]

CLINICAL DATA: Chest pain

EXAM:
CHEST - 2 VIEW

[chest pa]
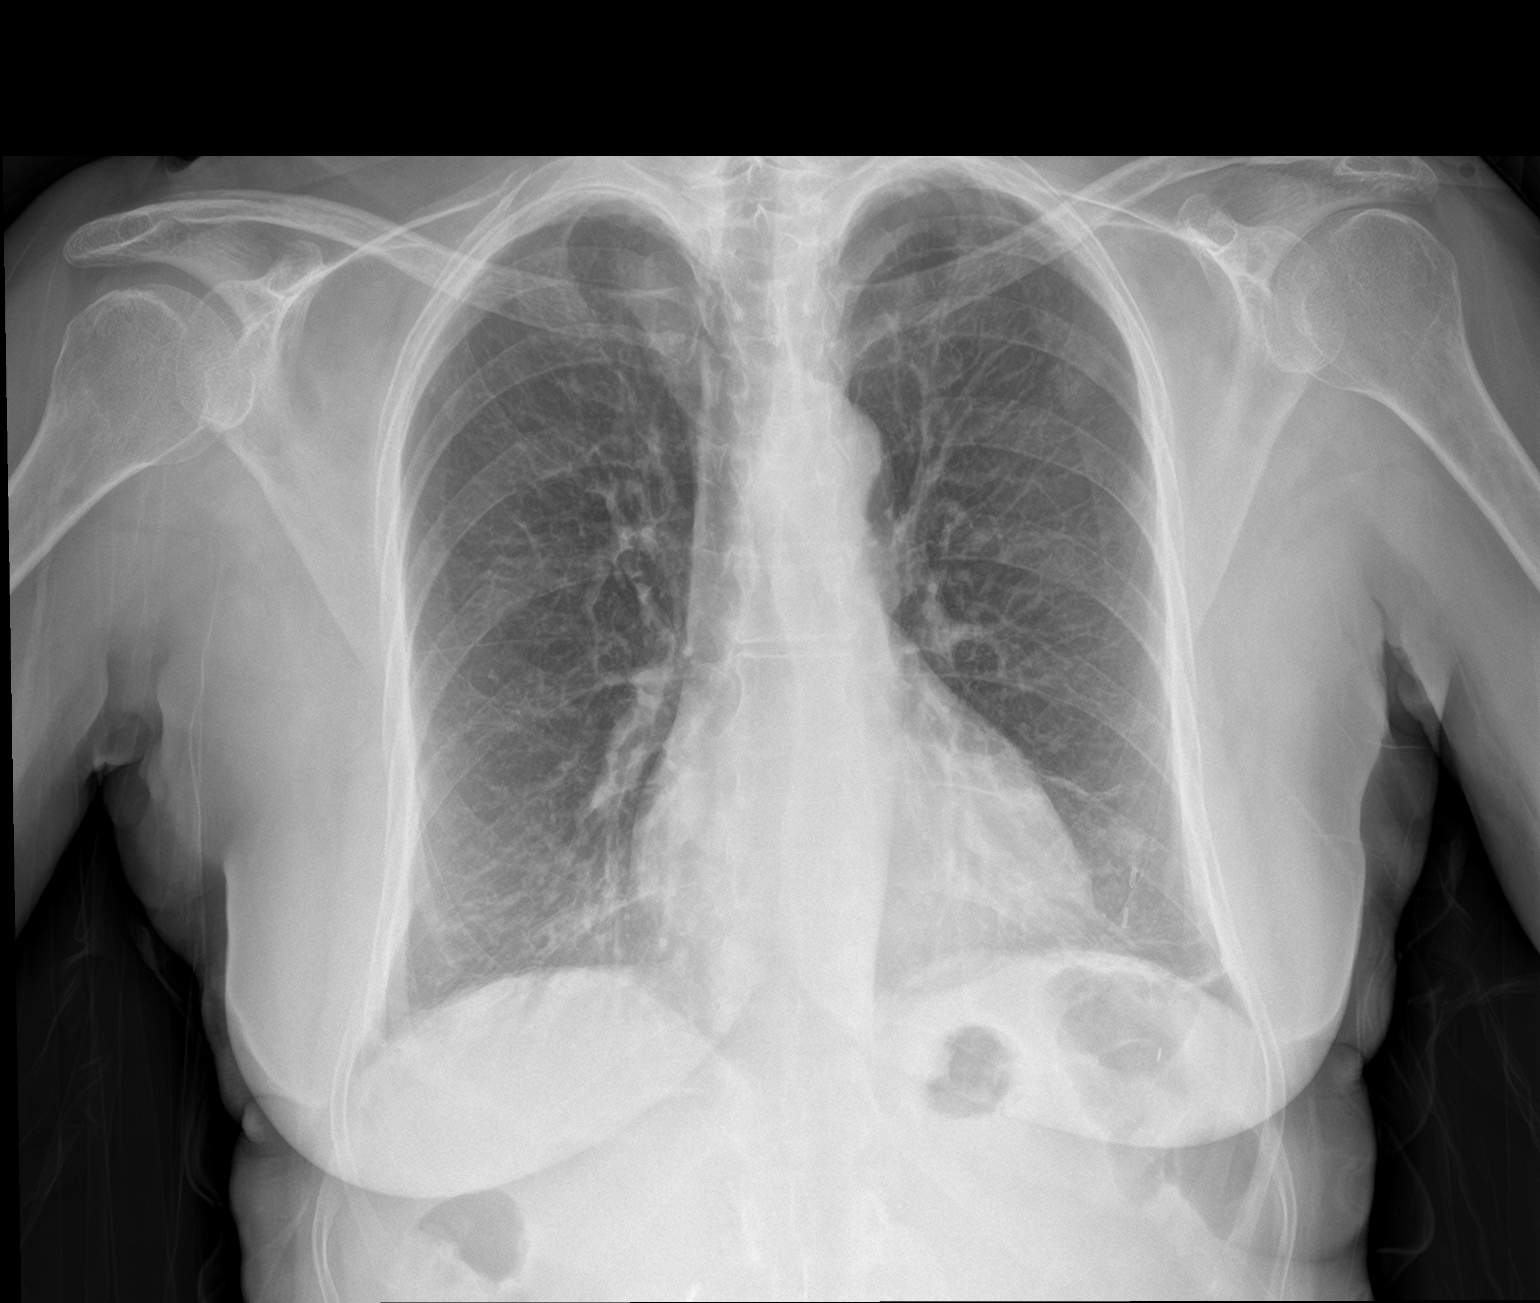

[chest lat]
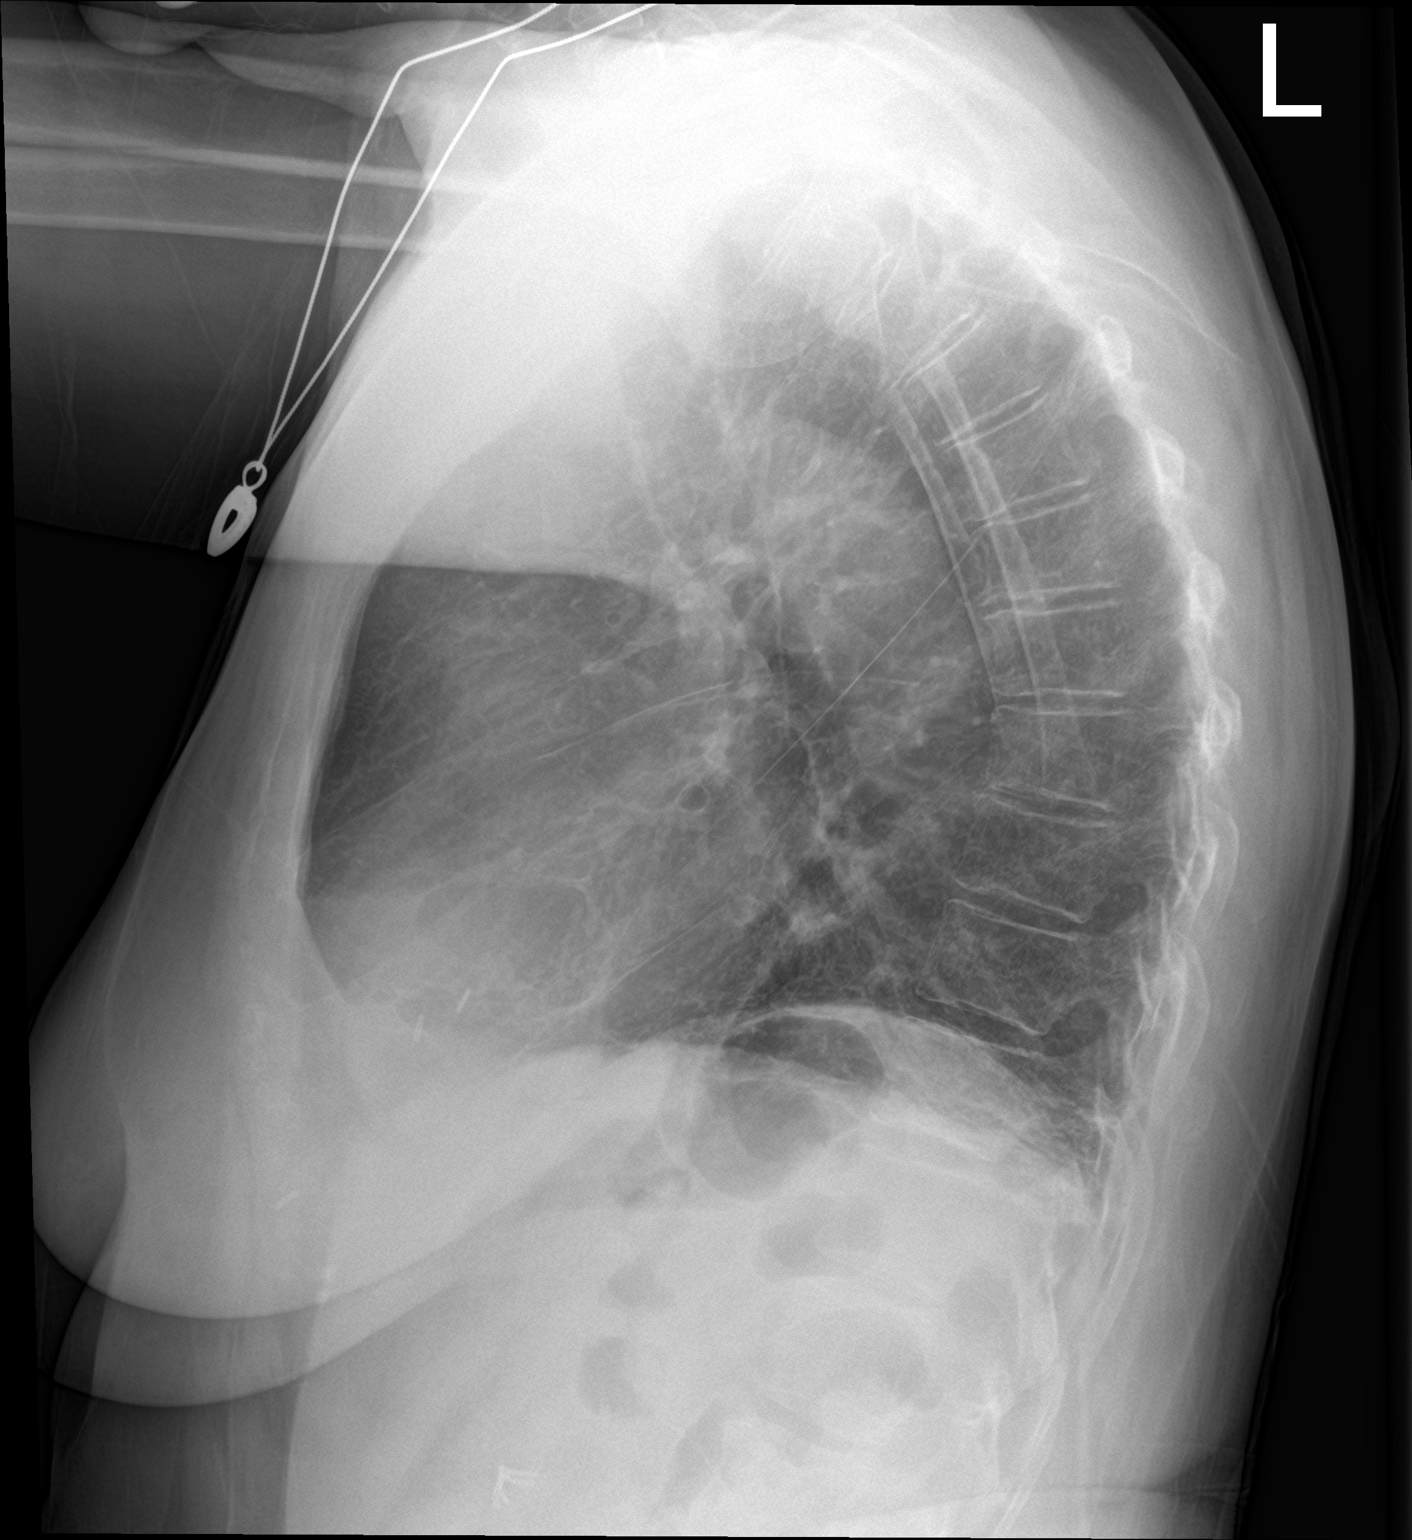

[2 of 2 positions shown; findings below may reference images not displayed]

FINDINGS: Heart size and vascularity normal. Mild bibasilar atelectasis.
Negative for heart failure or edema. No effusion.
IMPRESSION: Mild bibasilar atelectasis.

## 2017-04-15 MED ORDER — MORPHINE SULFATE (PF) 4 MG/ML IV SOLN
4.0000 mg | Freq: Once | INTRAVENOUS | Status: AC
Start: 2017-04-15 — End: 2017-04-15
  Administered 2017-04-15: 4 mg via INTRAVENOUS
  Filled 2017-04-15: qty 1

## 2017-04-15 MED ORDER — ONDANSETRON HCL 4 MG/2ML IJ SOLN
4.0000 mg | Freq: Once | INTRAMUSCULAR | Status: AC
  Administered 2017-04-15: 4 mg via INTRAVENOUS
  Filled 2017-04-15: qty 2

## 2017-04-15 NOTE — Discharge Instructions (Signed)
Creatinine which is a measure of your kidney function was 2.23.  This is elevated.  Increase fluids.  Follow-up with your primary care doctor this week.  Do not take ibuprofen or Advil.  Tylenol for pain.  Return if worse.  The remainder of your tests look good.

## 2017-04-15 NOTE — ED Triage Notes (Signed)
Pt c/o left chest pain since 1130.

## 2017-04-17 DIAGNOSIS — I10 Essential (primary) hypertension: Secondary | ICD-10-CM | POA: Diagnosis not present

## 2017-04-17 DIAGNOSIS — K219 Gastro-esophageal reflux disease without esophagitis: Secondary | ICD-10-CM | POA: Diagnosis not present

## 2017-04-17 DIAGNOSIS — N179 Acute kidney failure, unspecified: Secondary | ICD-10-CM | POA: Diagnosis not present

## 2017-04-17 DIAGNOSIS — G4709 Other insomnia: Secondary | ICD-10-CM | POA: Diagnosis not present

## 2017-04-17 DIAGNOSIS — R0789 Other chest pain: Secondary | ICD-10-CM | POA: Diagnosis not present

## 2017-04-17 DIAGNOSIS — Z6824 Body mass index (BMI) 24.0-24.9, adult: Secondary | ICD-10-CM | POA: Diagnosis not present

## 2017-04-17 DIAGNOSIS — R152 Fecal urgency: Secondary | ICD-10-CM | POA: Diagnosis not present

## 2017-04-17 DIAGNOSIS — D509 Iron deficiency anemia, unspecified: Secondary | ICD-10-CM | POA: Diagnosis not present

## 2017-04-17 NOTE — ED Provider Notes (Signed)
[facility] EMERGENCY DEPARTMENT Provider Note   CSN: [id number] Arrival date & time: 04/15/17  1931     History   Chief Complaint Chief Complaint  Patient presents with  . Chest Pain    HPI Kimberly Mccormick is a 67 y.o. female.  Patient reports left-sided chest pain since 11:30 AM today.  Pain is constant and described as sharp.  She is tender to palpation.  Past medical history includes hypertension, but no but diabetes, smoking, family history.  Severity of pain is moderate.  Nothing makes symptoms better or worse.  She has tried nothing for the pain     Past Medical History:  Diagnosis Date  . Anemia 1970  . Genital herpes   . GERD (gastroesophageal reflux disease)   . Hypertension   . Otitis media    right  . Sinus infection 01/04/14    Patient Active Problem List   Diagnosis Date Noted  . Essential hypertension 03/21/2015  . High cholesterol 03/21/2015  . Genital herpes 03/21/2015  . Fecal urgency 03/21/2015  . GERD (gastroesophageal reflux disease) 03/21/2015    Past Surgical History:  Procedure Laterality Date  . APPENDECTOMY    . CATARACT EXTRACTION W/PHACO Left 01/10/2014   Procedure: CATARACT EXTRACTION PHACO AND INTRAOCULAR LENS PLACEMENT ; CDE:  4.94;  Surgeon: Williams Che, MD;  Location: [facility];  Service: Ophthalmology;  Laterality: Left;  . CESAREAN SECTION    . CHOLECYSTECTOMY    . MYRINGOTOMY WITH TUBE PLACEMENT Right 02/04/2017   Procedure: REVISION OF RIGHT MYRINGOTOMY WITH TUBE PLACEMENT, WITH EXAM OF LEFT EAR;  Surgeon: Leta Baptist, MD;  Location: [facility];  Service: ENT;  Laterality: Right;  . NASAL SINUS SURGERY    . TONSILLECTOMY       OB History   None      Home Medications    Prior to Admission medications   Medication Sig Start Date End Date Taking? Authorizing Provider  METOPROLOL SUCCINATE ER PO Take 25 mg by mouth daily.   Yes [provider]  omeprazole (PRILOSEC) 40 MG capsule Take 40 mg by  mouth daily.   Yes [provider]  acyclovir (ZOVIRAX) 400 MG tablet Take 400 mg by mouth 2 (two) times daily.    [provider]  B Complex-C (SUPER B COMPLEX PO) Take by mouth.    [provider]  cetirizine (ZYRTEC) 10 MG tablet Take 10 mg by mouth daily.    [provider]  Cholecalciferol (VITAMIN D) 2000 UNITS tablet Take 2,000 Units by mouth daily.    [provider]  dicyclomine (BENTYL) 10 MG capsule Take 1 capsule (10 mg total) by mouth 3 (three) times daily before meals. 03/21/15   Setzer, Rona Ravens, NP  naproxen (NAPROSYN) 250 MG tablet Take 1 po BID with food prn pain 09/26/15   Rolland Porter, MD  nitroGLYCERIN (NITROSTAT) 0.4 MG SL tablet Place 0.4 mg under the tongue every 5 (five) minutes as needed for chest pain.    [provider]  zolpidem (AMBIEN) 10 MG tablet Take 10 mg by mouth at bedtime as needed for sleep.    [provider]    Family History History reviewed. No pertinent family history.  Social History Social History   Tobacco Use  . Smoking status: Never Smoker  . Smokeless tobacco: Never Used  Substance Use Topics  . Alcohol use: No  . Drug use: No     Allergies   Eggs or  egg-derived products; Other; and Penicillins   Review of Systems Review of Systems  All other systems reviewed and are negative.    Physical Exam Updated Vital Signs BP (!) 174/93   Pulse (!) 57   Temp 98.5 F (36.9 C) (Oral)   Resp 17   Ht 5\' 4"  (1.626 m)   Wt 61.2 kg (135 lb)   SpO2 100%   BMI 23.17 kg/m   Physical Exam  Constitutional: She is oriented to person, place, and time. She appears well-developed and well-nourished.  HENT:  Head: Normocephalic and atraumatic.  Eyes: Conjunctivae are normal.  Neck: Neck supple.  Cardiovascular: Normal rate and regular rhythm.  Pulmonary/Chest: Effort normal and breath sounds normal.  Tender to palpation left anterior chest wall  Abdominal: Soft. Bowel sounds are  normal.  Musculoskeletal: Normal range of motion.  Neurological: She is alert and oriented to person, place, and time.  Skin: Skin is warm and dry.  Psychiatric: She has a normal mood and affect. Her behavior is normal.  Nursing note and vitals reviewed.    ED Treatments / Results  Labs (all labs ordered are listed, but only abnormal results are displayed) Labs Reviewed  BASIC METABOLIC PANEL - Abnormal; Notable for the following components:      Result Value   Glucose, Bld 108 (*)    Creatinine, Ser 2.23 (*)    GFR calc non Af Amer 21 (*)    GFR calc Af Amer 25 (*)    All other components within normal limits  CBC - Abnormal; Notable for the following components:   Hemoglobin 11.6 (*)    MCH 25.7 (*)    All other components within normal limits  TROPONIN I    EKG EKG Interpretation  Date/Time:  Tuesday April 15 2017 22:36:34 EDT Ventricular Rate:  60 PR Interval:  142 QRS Duration: 89 QT Interval:  495 QTC Calculation: 495 R Axis:   38 Text Interpretation:  Sinus rhythm Abnormal R-wave progression, early transition Consider left ventricular hypertrophy Borderline prolonged QT interval Baseline wander in lead(s) V2 No STEMI Confirmed by Antony Blackbird [phone]) on 04/16/2017 5:51:57 PM   Radiology Dg Chest 2 View  Result Date: 04/15/2017 CLINICAL DATA:  Chest pain EXAM: CHEST - 2 VIEW COMPARISON:  01/11/2015 FINDINGS: Heart size and vascularity normal. Mild bibasilar atelectasis. Negative for heart failure or edema. No effusion. IMPRESSION: Mild bibasilar atelectasis. Electronically Signed   By: Franchot Gallo M.D.   On: 04/15/2017 20:28    Procedures Procedures (including critical care time)  Medications Ordered in ED Medications  morphine 4 MG/ML injection 4 mg (4 mg Intravenous Given 04/15/17 2321)  ondansetron (ZOFRAN) injection 4 mg (4 mg Intravenous Given 04/15/17 2321)     Initial Impression / Assessment and Plan / ED Course  I have reviewed the triage vital  signs and the nursing notes.  Pertinent labs & imaging results that were available during my care of the patient were reviewed by me and considered in my medical decision making (see chart for details).     Patient is a low risk for ACS or PE.  Her workup including labs, EKG, chest x-ray, troponin all negative.  However, her creatinine was elevated.  This was discussed with the patient and her son.  She will seek primary care/nephrology follow-up.  Final Clinical Impressions(s) / ED Diagnoses   Final diagnoses:  Chest pain, unspecified type  AKI (acute kidney injury) [facility])    ED Discharge Orders  None       Nat Christen, MD 04/17/17 1231

## 2017-04-24 DIAGNOSIS — R0789 Other chest pain: Secondary | ICD-10-CM | POA: Diagnosis not present

## 2017-04-24 DIAGNOSIS — R079 Chest pain, unspecified: Secondary | ICD-10-CM | POA: Diagnosis not present

## 2017-04-24 DIAGNOSIS — I10 Essential (primary) hypertension: Secondary | ICD-10-CM | POA: Diagnosis not present

## 2017-04-24 DIAGNOSIS — E785 Hyperlipidemia, unspecified: Secondary | ICD-10-CM | POA: Diagnosis not present

## 2017-06-26 ENCOUNTER — Encounter (HOSPITAL_COMMUNITY): Payer: Self-pay | Admitting: Emergency Medicine

## 2017-06-26 ENCOUNTER — Emergency Department (HOSPITAL_COMMUNITY)
Admission: EM | Admit: 2017-06-26 | Discharge: 2017-06-26 | Disposition: A | Discharge status: Home / Self Care | Payer: Medicare HMO | Attending: Emergency Medicine | Admitting: Emergency Medicine

## 2017-06-26 ENCOUNTER — Emergency Department (HOSPITAL_COMMUNITY): Payer: Medicare HMO

## 2017-06-26 ENCOUNTER — Other Ambulatory Visit: Payer: Self-pay

## 2017-06-26 DIAGNOSIS — I10 Essential (primary) hypertension: Secondary | ICD-10-CM | POA: Insufficient documentation

## 2017-06-26 DIAGNOSIS — E7800 Pure hypercholesterolemia, unspecified: Secondary | ICD-10-CM | POA: Diagnosis not present

## 2017-06-26 DIAGNOSIS — R531 Weakness: Secondary | ICD-10-CM | POA: Insufficient documentation

## 2017-06-26 DIAGNOSIS — Z79899 Other long term (current) drug therapy: Secondary | ICD-10-CM | POA: Diagnosis not present

## 2017-06-26 LAB — URINALYSIS, ROUTINE W REFLEX MICROSCOPIC
BILIRUBIN URINE: NEGATIVE
Glucose, UA: NEGATIVE mg/dL
HGB URINE DIPSTICK: NEGATIVE
Ketones, ur: NEGATIVE mg/dL
Leukocytes, UA: NEGATIVE
NITRITE: NEGATIVE
PROTEIN: NEGATIVE mg/dL
Specific Gravity, Urine: 1.014 (ref 1.005–1.030)
pH: 6 (ref 5.0–8.0)

## 2017-06-26 LAB — COMPREHENSIVE METABOLIC PANEL
ALBUMIN: 3.6 g/dL (ref 3.5–5.0)
ALT: 13 U/L — AB (ref 14–54)
ANION GAP: 6 (ref 5–15)
AST: 20 U/L (ref 15–41)
Alkaline Phosphatase: 94 U/L (ref 38–126)
BUN: 18 mg/dL (ref 6–20)
CO2: 26 mmol/L (ref 22–32)
Calcium: 8.7 mg/dL — ABNORMAL LOW (ref 8.9–10.3)
Chloride: 108 mmol/L (ref 101–111)
Creatinine, Ser: 1.03 mg/dL — ABNORMAL HIGH (ref 0.44–1.00)
GFR calc non Af Amer: 54 mL/min — ABNORMAL LOW (ref >60)
Glucose, Bld: 86 mg/dL (ref 65–99)
Potassium: 3.7 mmol/L (ref 3.5–5.1)
Sodium: 140 mmol/L (ref 135–145)
Total Bilirubin: 0.9 mg/dL (ref 0.3–1.2)
Total Protein: 6.9 g/dL (ref 6.5–8.1)

## 2017-06-26 LAB — CBC WITH DIFFERENTIAL/PLATELET
BASOS ABS: 0 10*3/uL (ref 0.0–0.1)
BASOS PCT: 1 %
EOS ABS: 0.8 10*3/uL — AB (ref 0.0–0.7)
EOS PCT: 10 %
HCT: 37.1 % (ref 36.0–46.0)
Hemoglobin: 11.9 g/dL — ABNORMAL LOW (ref 12.0–15.0)
LYMPHS ABS: 3.8 10*3/uL (ref 0.7–4.0)
Lymphocytes Relative: 52 %
MCH: 25.7 pg — AB (ref 26.0–34.0)
MCHC: 32.1 g/dL (ref 30.0–36.0)
MCV: 80.1 fL (ref 78.0–100.0)
Monocytes Absolute: 0.4 10*3/uL (ref 0.1–1.0)
Monocytes Relative: 6 %
NEUTROS PCT: 31 %
Neutro Abs: 2.3 10*3/uL (ref 1.7–7.7)
PLATELETS: 236 10*3/uL (ref 150–400)
RBC: 4.63 MIL/uL (ref 3.87–5.11)
RDW: 13.3 % (ref 11.5–15.5)
WBC: 7.3 10*3/uL (ref 4.0–10.5)

## 2017-06-26 LAB — TROPONIN I: Troponin I: <0.03 ng/mL (ref <0.03)

## 2017-06-26 IMAGING — DX DG CHEST 2V
2 series · 2 of 2 positions shown · non-contrast
Comparison: [DATE]

CLINICAL DATA: Weakness

EXAM:
CHEST - 2 VIEW

[chest pa]
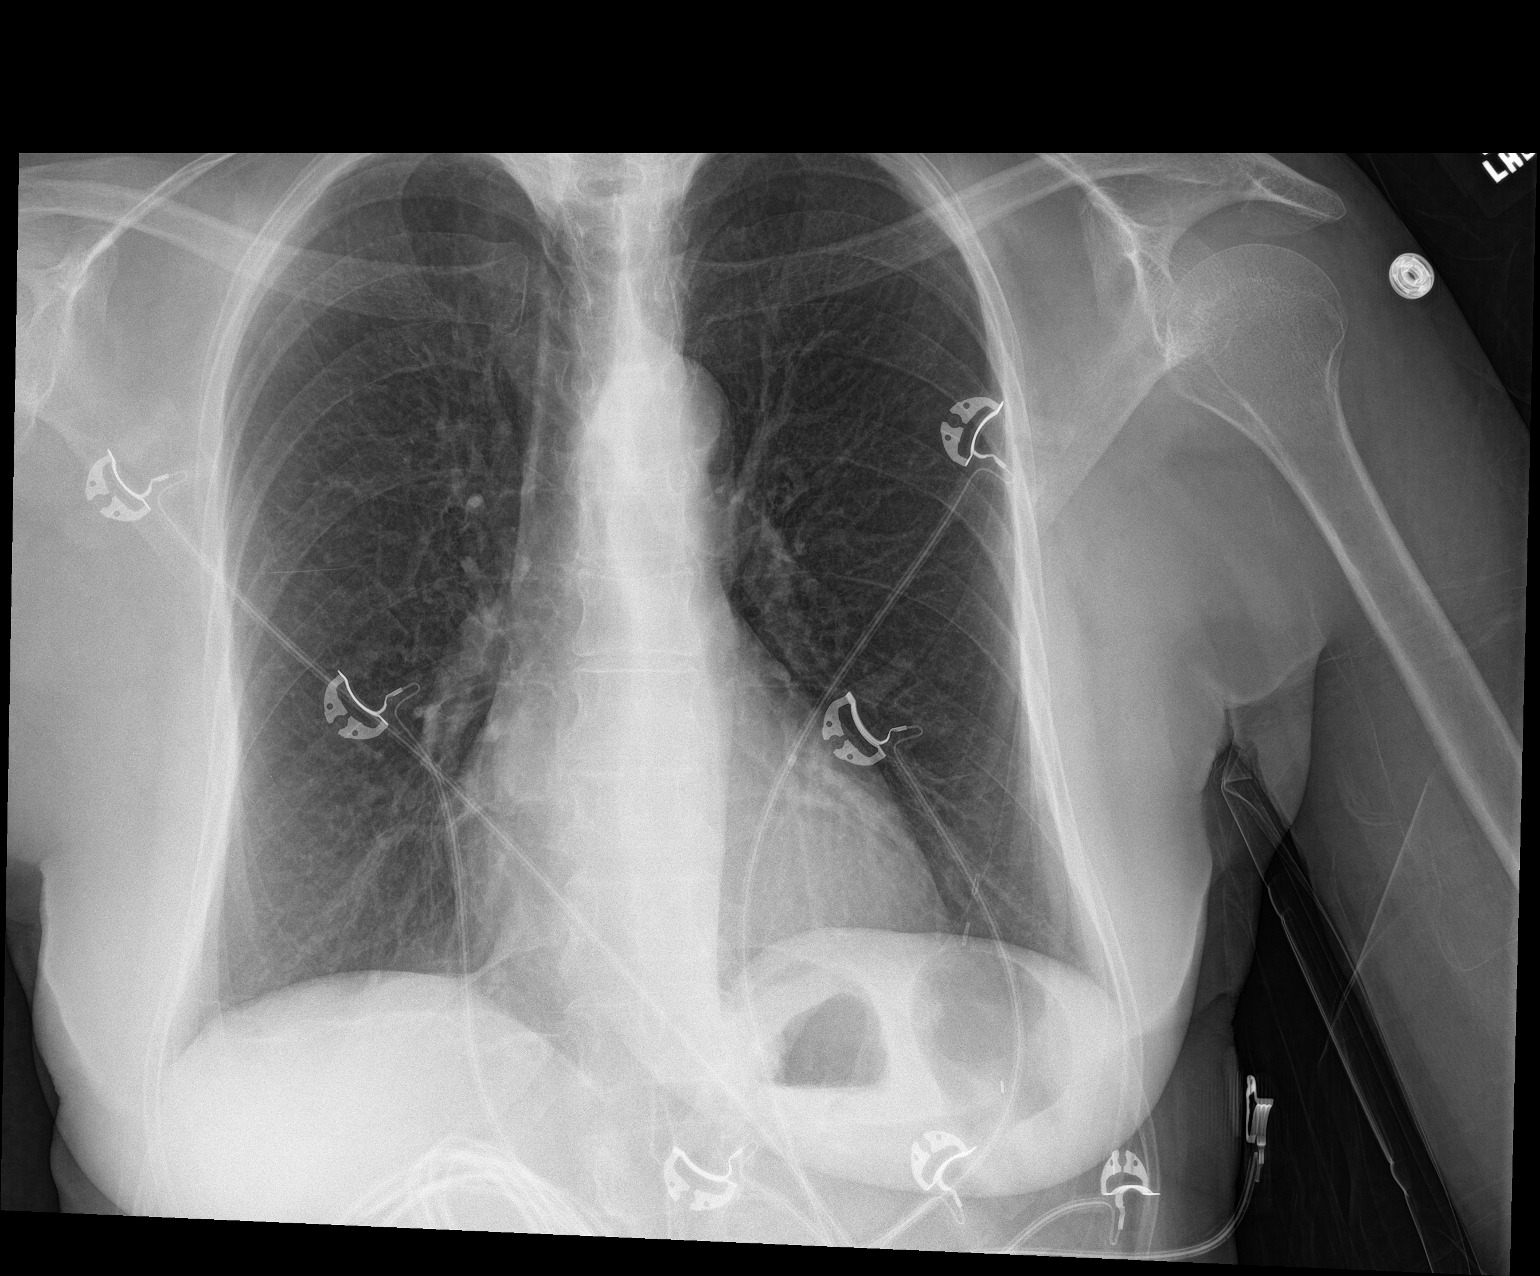

[chest lat]
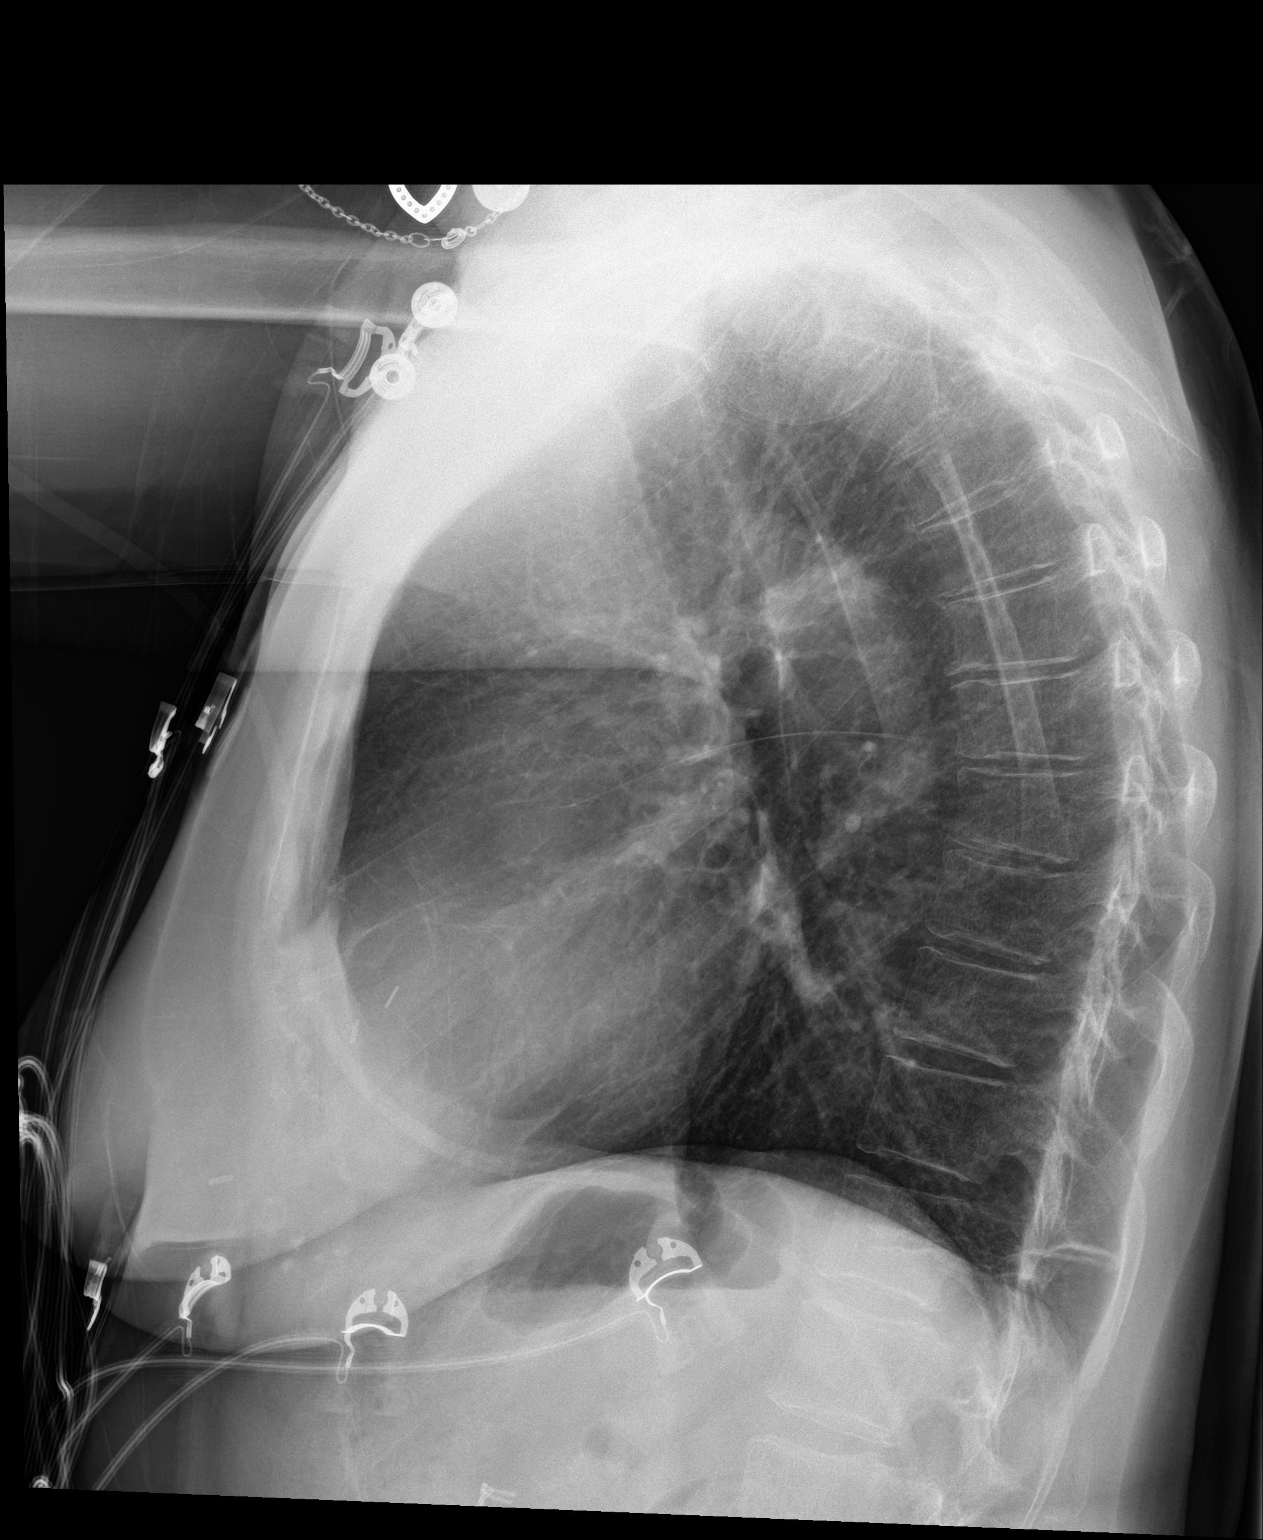

[2 of 2 positions shown; findings below may reference images not displayed]

FINDINGS: Lungs are clear.  No pleural effusion or pneumothorax.

The heart is normal in size.

Visualized osseous structures are within normal limits.
IMPRESSION: Normal chest radiographs.

## 2017-06-26 MED ORDER — SODIUM CHLORIDE 0.9 % IV BOLUS
500.0000 mL | Freq: Once | INTRAVENOUS | Status: AC
  Administered 2017-06-26: 500 mL via INTRAVENOUS

## 2017-06-26 NOTE — ED Provider Notes (Signed)
[facility] EMERGENCY DEPARTMENT Provider Note   CSN: [id number] Arrival date & time: 06/26/17  1750     History   Chief Complaint Chief Complaint  Patient presents with  . Weakness    HPI Kimberly Mccormick is a 67 y.o. female.  Patient states she went to a funeral yesterday and was on able to get a sleep last night.  She complains of weakness today and fatigue  The history is provided by the patient. No language interpreter was used.  Weakness  Primary symptoms include no focal weakness. This is a new problem. The current episode started 3 to 5 hours ago. The problem has been gradually improving. There was no focality noted. There has been no fever (No fever). Pertinent negatives include no chest pain and no headaches.    Past Medical History:  Diagnosis Date  . Anemia 1970  . Genital herpes   . GERD (gastroesophageal reflux disease)   . Hypertension   . Otitis media    right  . Sinus infection 01/04/14    Patient Active Problem List   Diagnosis Date Noted  . Essential hypertension 03/21/2015  . High cholesterol 03/21/2015  . Genital herpes 03/21/2015  . Fecal urgency 03/21/2015  . GERD (gastroesophageal reflux disease) 03/21/2015    Past Surgical History:  Procedure Laterality Date  . APPENDECTOMY    . CATARACT EXTRACTION W/PHACO Left 01/10/2014   Procedure: CATARACT EXTRACTION PHACO AND INTRAOCULAR LENS PLACEMENT ; CDE:  4.94;  Surgeon: Williams Che, MD;  Location: [facility];  Service: Ophthalmology;  Laterality: Left;  . CESAREAN SECTION    . CHOLECYSTECTOMY    . MYRINGOTOMY WITH TUBE PLACEMENT Right 02/04/2017   Procedure: REVISION OF RIGHT MYRINGOTOMY WITH TUBE PLACEMENT, WITH EXAM OF LEFT EAR;  Surgeon: Leta Baptist, MD;  Location: [facility];  Service: ENT;  Laterality: Right;  . NASAL SINUS SURGERY    . TONSILLECTOMY       OB History   None      Home Medications    Prior to Admission medications   Medication Sig Start Date End Date  Taking? Authorizing Provider  acyclovir (ZOVIRAX) 400 MG tablet Take 400 mg by mouth daily.    Yes [provider]  cetirizine (ZYRTEC) 10 MG tablet Take 10 mg by mouth daily.   Yes [provider]  Cholecalciferol (VITAMIN D) 2000 UNITS tablet Take 2,000 Units by mouth daily.   Yes [provider]  metoprolol succinate (TOPROL-XL) 25 MG 24 hr tablet Take 25 mg by mouth daily. 05/28/17  Yes [provider]  nitroGLYCERIN (NITROSTAT) 0.4 MG SL tablet Place 0.4 mg under the tongue every 5 (five) minutes as needed for chest pain.   Yes [provider]  omeprazole (PRILOSEC) 40 MG capsule Take 40 mg by mouth daily.   Yes [provider]  zolpidem (AMBIEN) 10 MG tablet Take 10 mg by mouth at bedtime as needed for sleep.   Yes [provider]    Family History No family history on file.  Social History Social History   Tobacco Use  . Smoking status: Never Smoker  . Smokeless tobacco: Never Used  Substance Use Topics  . Alcohol use: No  . Drug use: No     Allergies   Eggs or egg-derived products; Other; and Penicillins   Review of Systems Review of Systems  Constitutional: Negative for appetite change and fatigue.  HENT: Negative for congestion, ear discharge and sinus pressure.  Eyes: Negative for discharge.  Respiratory: Negative for cough.   Cardiovascular: Negative for chest pain.  Gastrointestinal: Negative for abdominal pain and diarrhea.  Genitourinary: Negative for frequency and hematuria.  Musculoskeletal: Negative for back pain.  Skin: Negative for rash.  Neurological: Positive for weakness. Negative for focal weakness, seizures and headaches.  Psychiatric/Behavioral: Negative for hallucinations.     Physical Exam Updated Vital Signs BP (!) 155/80   Pulse 62   Temp 97.6 F (36.4 C) (Oral)   Resp 19   Ht 5\' 5"  (1.651 m)   Wt 61.7 kg (136 lb)   SpO2 99%   BMI 22.63 kg/m   Physical Exam    Constitutional: She is oriented to person, place, and time. She appears well-developed.  HENT:  Head: Normocephalic.  Eyes: Conjunctivae and EOM are normal. No scleral icterus.  Neck: Neck supple. No thyromegaly present.  Cardiovascular: Normal rate and regular rhythm. Exam reveals no gallop and no friction rub.  No murmur heard. Pulmonary/Chest: No stridor. She has no wheezes. She has no rales. She exhibits no tenderness.  Abdominal: She exhibits no distension. There is no tenderness. There is no rebound.  Musculoskeletal: Normal range of motion. She exhibits no edema.  Lymphadenopathy:    She has no cervical adenopathy.  Neurological: She is oriented to person, place, and time. She exhibits normal muscle tone. Coordination normal.  Skin: No rash noted. No erythema.  Psychiatric: She has a normal mood and affect. Her behavior is normal.     ED Treatments / Results  Labs (all labs ordered are listed, but only abnormal results are displayed) Labs Reviewed  CBC WITH DIFFERENTIAL/PLATELET - Abnormal; Notable for the following components:      Result Value   Hemoglobin 11.9 (*)    MCH 25.7 (*)    Eosinophils Absolute 0.8 (*)    All other components within normal limits  COMPREHENSIVE METABOLIC PANEL - Abnormal; Notable for the following components:   Creatinine, Ser 1.03 (*)    Calcium 8.7 (*)    ALT 13 (*)    GFR calc non Af Amer 54 (*)    All other components within normal limits  URINALYSIS, ROUTINE W REFLEX MICROSCOPIC  TROPONIN I    EKG EKG Interpretation  Date/Time:  Thursday June 26 2017 18:05:09 EDT Ventricular Rate:  68 PR Interval:    QRS Duration: 81 QT Interval:  419 QTC Calculation: 446 R Axis:   36 Text Interpretation:  Sinus rhythm Posterior infarct, old Baseline wander in lead(s) II III aVR aVL aVF V1 V4 V5 V6 Confirmed by Milton Ferguson [id number]) on 06/26/2017 7:49:41 PM   Radiology Dg Chest 2 View  Result Date: 06/26/2017 CLINICAL DATA:  Weakness EXAM:  CHEST - 2 VIEW COMPARISON:  04/15/2017 FINDINGS: Lungs are clear.  No pleural effusion or pneumothorax. The heart is normal in size. Visualized osseous structures are within normal limits. IMPRESSION: Normal chest radiographs. Electronically Signed   By: Julian Hy M.D.   On: 06/26/2017 18:47    Procedures Procedures (including critical care time)  Medications Ordered in ED Medications  sodium chloride 0.9 % bolus 500 mL (0 mLs Intravenous Stopped 06/26/17 1917)     Initial Impression / Assessment and Plan / ED Course  I have reviewed the triage vital signs and the nursing notes.  Pertinent labs & imaging results that were available during my care of the patient were reviewed by me and considered in my medical decision making (see chart for details).  Patient's weakness improved with some IV fluids.  CBC chemistries chest x-ray all unremarkable.  Patient is mildly dehydrated and fatigued from not getting any sleep.  She will go home and rest this weekend and follow-up with her PCP  Final Clinical Impressions(s) / ED Diagnoses   Final diagnoses:  Weakness    ED Discharge Orders    None       Milton Ferguson, MD 06/26/17 2012

## 2017-06-26 NOTE — Discharge Instructions (Addendum)
Drink plenty of fluids.  Get plenty of rest this weekend.  Follow-up with your doctor next week if any problems

## 2017-06-26 NOTE — ED Triage Notes (Signed)
Weakness x past few days, pt states she took a trip to [place] on Tuesday and feels like she has not recuperated.  Family member states pt has been on and off confused and fell up the steps today.  Pt reports she remembers falling, states she tripped.

## 2017-07-14 DIAGNOSIS — G44209 Tension-type headache, unspecified, not intractable: Secondary | ICD-10-CM | POA: Diagnosis not present

## 2017-07-14 DIAGNOSIS — Z6824 Body mass index (BMI) 24.0-24.9, adult: Secondary | ICD-10-CM | POA: Diagnosis not present

## 2017-07-15 DIAGNOSIS — M79671 Pain in right foot: Secondary | ICD-10-CM | POA: Diagnosis not present

## 2017-07-15 DIAGNOSIS — B070 Plantar wart: Secondary | ICD-10-CM | POA: Diagnosis not present

## 2017-08-26 DIAGNOSIS — L03311 Cellulitis of abdominal wall: Secondary | ICD-10-CM | POA: Diagnosis not present

## 2017-08-26 DIAGNOSIS — Z6823 Body mass index (BMI) 23.0-23.9, adult: Secondary | ICD-10-CM | POA: Diagnosis not present

## 2018-01-21 DIAGNOSIS — Z9221 Personal history of antineoplastic chemotherapy: Secondary | ICD-10-CM

## 2018-01-21 HISTORY — DX: Personal history of antineoplastic chemotherapy: Z92.21

## 2018-02-17 DIAGNOSIS — Z1231 Encounter for screening mammogram for malignant neoplasm of breast: Secondary | ICD-10-CM | POA: Diagnosis not present

## 2018-03-11 DIAGNOSIS — N6489 Other specified disorders of breast: Secondary | ICD-10-CM | POA: Diagnosis not present

## 2018-03-11 DIAGNOSIS — N6322 Unspecified lump in the left breast, upper inner quadrant: Secondary | ICD-10-CM | POA: Diagnosis not present

## 2018-03-11 DIAGNOSIS — R928 Other abnormal and inconclusive findings on diagnostic imaging of breast: Secondary | ICD-10-CM | POA: Diagnosis not present

## 2018-03-17 DIAGNOSIS — E782 Mixed hyperlipidemia: Secondary | ICD-10-CM | POA: Diagnosis not present

## 2018-03-17 DIAGNOSIS — N179 Acute kidney failure, unspecified: Secondary | ICD-10-CM | POA: Diagnosis not present

## 2018-03-17 DIAGNOSIS — K219 Gastro-esophageal reflux disease without esophagitis: Secondary | ICD-10-CM | POA: Diagnosis not present

## 2018-03-17 DIAGNOSIS — I10 Essential (primary) hypertension: Secondary | ICD-10-CM | POA: Diagnosis not present

## 2018-03-17 DIAGNOSIS — E559 Vitamin D deficiency, unspecified: Secondary | ICD-10-CM | POA: Diagnosis not present

## 2018-03-24 DIAGNOSIS — D0582 Other specified type of carcinoma in situ of left breast: Secondary | ICD-10-CM | POA: Diagnosis not present

## 2018-03-24 DIAGNOSIS — N6322 Unspecified lump in the left breast, upper inner quadrant: Secondary | ICD-10-CM | POA: Diagnosis not present

## 2018-03-24 DIAGNOSIS — C50212 Malignant neoplasm of upper-inner quadrant of left female breast: Secondary | ICD-10-CM | POA: Diagnosis not present

## 2018-03-25 DIAGNOSIS — E782 Mixed hyperlipidemia: Secondary | ICD-10-CM | POA: Diagnosis not present

## 2018-03-25 DIAGNOSIS — D509 Iron deficiency anemia, unspecified: Secondary | ICD-10-CM | POA: Diagnosis not present

## 2018-03-25 DIAGNOSIS — Z6824 Body mass index (BMI) 24.0-24.9, adult: Secondary | ICD-10-CM | POA: Diagnosis not present

## 2018-03-25 DIAGNOSIS — N6320 Unspecified lump in the left breast, unspecified quadrant: Secondary | ICD-10-CM | POA: Diagnosis not present

## 2018-03-25 DIAGNOSIS — J329 Chronic sinusitis, unspecified: Secondary | ICD-10-CM | POA: Diagnosis not present

## 2018-03-25 DIAGNOSIS — E559 Vitamin D deficiency, unspecified: Secondary | ICD-10-CM | POA: Diagnosis not present

## 2018-03-25 DIAGNOSIS — I10 Essential (primary) hypertension: Secondary | ICD-10-CM | POA: Diagnosis not present

## 2018-03-25 DIAGNOSIS — R152 Fecal urgency: Secondary | ICD-10-CM | POA: Diagnosis not present

## 2018-04-02 DIAGNOSIS — Z803 Family history of malignant neoplasm of breast: Secondary | ICD-10-CM | POA: Diagnosis not present

## 2018-04-02 DIAGNOSIS — Z9049 Acquired absence of other specified parts of digestive tract: Secondary | ICD-10-CM | POA: Diagnosis not present

## 2018-04-02 DIAGNOSIS — K219 Gastro-esophageal reflux disease without esophagitis: Secondary | ICD-10-CM | POA: Diagnosis not present

## 2018-04-02 DIAGNOSIS — C50212 Malignant neoplasm of upper-inner quadrant of left female breast: Secondary | ICD-10-CM | POA: Diagnosis not present

## 2018-04-03 ENCOUNTER — Ambulatory Visit (INDEPENDENT_AMBULATORY_CARE_PROVIDER_SITE_OTHER): Payer: Medicare HMO | Admitting: Internal Medicine

## 2018-04-03 ENCOUNTER — Other Ambulatory Visit: Payer: Self-pay

## 2018-04-03 ENCOUNTER — Encounter (INDEPENDENT_AMBULATORY_CARE_PROVIDER_SITE_OTHER): Payer: Self-pay | Admitting: Internal Medicine

## 2018-04-03 VITALS — BP 154/85 | HR 64 | Temp 97.7°F | Ht 63.0 in | Wt 138.1 lb

## 2018-04-03 DIAGNOSIS — K589 Irritable bowel syndrome without diarrhea: Secondary | ICD-10-CM | POA: Diagnosis not present

## 2018-04-03 NOTE — Patient Instructions (Addendum)
Get a Probiotic over the counter. (Samples of Restora given to patient) Will put on a recall for 11/2018

## 2018-04-03 NOTE — Progress Notes (Signed)
Subjective:    Patient ID: Kimberly Mccormick, female    DOB: 02/06/1950, 68 y.o.   MRN: [id number]  HPI Referred by Dr. Pleas Koch for IBS. She tells me she has breast cancer (Left). Found 2 weeks ago. Appt for next week at [facility]. She tells me she has to have a BM as soon as she eats. Someday's she has multiple stools during the morning. Symptoms for same.  Her appetite is okay. No weight loss. BMs x 2-3 daily, on average. She does not eat out because of the urgency. Stools are formed usually, but can be loose.   Last seen in February of 2017 for same. Given Rx for Dicyclomine but could not tolerate.  Her last colonoscopy was November of 2010 By Dr. Laural Golden. (IDA, heme positive stools). Incomplete exam to hepatic flexure. Redundant colon. Segments that were examined were normal except for same pigmentation in sigmoid colon. External hemorrhoids.  11/21/2008 BE: Colon is somewhat tortuous. No colonic lesion is evident.   Review of Systems Past Medical History:  Diagnosis Date  . Anemia 1970  . Genital herpes   . GERD (gastroesophageal reflux disease)   . Hypertension   . Otitis media    right  . Sinus infection 01/04/14    Past Surgical History:  Procedure Laterality Date  . APPENDECTOMY    . CATARACT EXTRACTION W/PHACO Left 01/10/2014   Procedure: CATARACT EXTRACTION PHACO AND INTRAOCULAR LENS PLACEMENT ; CDE:  4.94;  Surgeon: Williams Che, MD;  Location: [facility];  Service: Ophthalmology;  Laterality: Left;  . CESAREAN SECTION    . CHOLECYSTECTOMY    . MYRINGOTOMY WITH TUBE PLACEMENT Right 02/04/2017   Procedure: REVISION OF RIGHT MYRINGOTOMY WITH TUBE PLACEMENT, WITH EXAM OF LEFT EAR;  Surgeon: Leta Baptist, MD;  Location: [place];  Service: ENT;  Laterality: Right;  . NASAL SINUS SURGERY    . TONSILLECTOMY      Allergies  Allergen Reactions  . Eggs Or Egg-Derived Products Nausea And Vomiting  . Other Nausea And Vomiting    Dairy  products\   . Penicillins Hives    Has patient had a PCN reaction causing immediate rash, facial/tongue/throat swelling, SOB or lightheadedness with hypotension: No Has patient had a PCN reaction causing severe rash involving mucus membranes or skin necrosis: No Has patient had a PCN reaction that required hospitalization: No Has patient had a PCN reaction occurring within the last 10 years: No If all of the above answers are "NO", then may proceed with Cephalosporin use.     Current Outpatient Medications on File Prior to Visit  Medication Sig Dispense Refill  . acyclovir (ZOVIRAX) 400 MG tablet Take 400 mg by mouth 2 (two) times daily.     . B Complex-C (B-COMPLEX WITH VITAMIN C) tablet Take 1 tablet by mouth daily.    . cetirizine (ZYRTEC) 10 MG tablet Take 10 mg by mouth daily.    . Cholecalciferol (VITAMIN D) 2000 UNITS tablet Take 2,000 Units by mouth daily.    . metoprolol succinate (TOPROL-XL) 25 MG 24 hr tablet Take 25 mg by mouth daily.  3  . zolpidem (AMBIEN) 10 MG tablet Take 10 mg by mouth at bedtime as needed for sleep.     No current facility-administered medications on file prior to visit.         Objective:   Physical Exam Blood pressure (!) 154/85, pulse 64, temperature 97.7 F (36.5 C), height 5\' 3"  (1.6 m),  weight 138 lb 1.6 oz (62.6 kg). Alert and oriented. Skin warm and dry. Oral mucosa is moist.   . Sclera anicteric, conjunctivae is pink. Thyroid not enlarged. No cervical lymphadenopathy. Lungs clear. Heart regular rate and rhythm.  Abdomen is soft. Bowel sounds are positive. No hepatomegaly. No abdominal masses felt. No tenderness.  No edema to lower extremities.         Assessment & Plan:  IBS. Continue to avoid dairy products. May try a probiotic.(Samples of Restora given to patient.  Screening colonoscopy. Will put on a recall for November of this year.

## 2018-04-07 ENCOUNTER — Telehealth: Payer: Self-pay | Admitting: Hematology and Oncology

## 2018-04-07 NOTE — Telephone Encounter (Signed)
A new patient appt has been scheduled for the pt to see Dr. Lindi Adie on 3/26 at 1pm. Pt aware to arrive 15 minutes early.

## 2018-04-14 NOTE — Progress Notes (Signed)
Kimberly Mccormick NOTE  Patient Care Team: Curlene Labrum, MD as PCP - General  HEMATOLOGY-ONCOLOGY WebEx/ Video VISIT PROGRESS NOTE  I connected with Kimberly Mccormick on 04/16/18 at  1:00 PM EDT by telephone and WebEx video conference and verified that I am speaking with the correct person using two identifiers.  I discussed the limitations, risks, security and privacy concerns of performing an evaluation and management service by WebEx video visit and the availability of in person appointments.  I also discussed with the patient that there may be a patient responsible charge related to this service. The patient expressed understanding and agreed to proceed.   CHIEF COMPLAINTS/PURPOSE OF CONSULTATION: Newly diagnosed breast cancer  HISTORY OF PRESENTING ILLNESS:  Kimberly Mccormick 68 y.o. female is here because of recent diagnosis of invasive ductal carcinoma of the left breast. The cancer was detected on a routine screening mammogram on 02/17/18 and was not palpable prior to diagnosis. A diagnostic mammogram on 03/10/18 showed a 0.8cm mass in the left breast with no abnormally appearing left axillary lymph nodes. A biopsy on 03/24/18 showed the cancer to be grade 2 invasive ductal carcinoma, HER2 negative, ER 90%, PR negative, Ki67 20%.   Our visit today was conducted over Webex with the patient's sister, who is her primary historian. She reports a family history of breast cancer in her paternal step-sister at age 34, but denies any family history of ovarian or pancreatic cancer. The patient's sister is very interested in herbs and natural remedies and is not certain about her sister undergoing treatment.   I reviewed her records extensively and collaborated the history with the patient.  SUMMARY OF ONCOLOGIC HISTORY:   Malignant neoplasm of upper-inner quadrant of left breast in female, estrogen receptor positive ([place])   03/24/2018 Initial Diagnosis    Screening  detected left breast mass 8 mm upper inner quadrant biopsy revealed grade 2 IDC with DCIS ER 95%, PR 0%, Ki-67 20%, HER-2 -1+ by IHC, T1 BN 0 stage Ia clinical stage     MEDICAL HISTORY:  Past Medical History:  Diagnosis Date  . Anemia 1970  . Genital herpes   . GERD (gastroesophageal reflux disease)   . Hypertension   . Otitis media    right  . Sinus infection 01/04/14    SURGICAL HISTORY: Past Surgical History:  Procedure Laterality Date  . APPENDECTOMY    . CATARACT EXTRACTION W/PHACO Left 01/10/2014   Procedure: CATARACT EXTRACTION PHACO AND INTRAOCULAR LENS PLACEMENT ; CDE:  4.94;  Surgeon: Williams Che, MD;  Location: [facility];  Service: Ophthalmology;  Laterality: Left;  . CESAREAN SECTION    . CHOLECYSTECTOMY    . MYRINGOTOMY WITH TUBE PLACEMENT Right 02/04/2017   Procedure: REVISION OF RIGHT MYRINGOTOMY WITH TUBE PLACEMENT, WITH EXAM OF LEFT EAR;  Surgeon: Leta Baptist, MD;  Location: [facility];  Service: ENT;  Laterality: Right;  . NASAL SINUS SURGERY    . TONSILLECTOMY      SOCIAL HISTORY: Social History   Socioeconomic History  . Marital status: Legally Separated    Spouse name: Not on file  . Number of children: Not on file  . Years of education: Not on file  . Highest education level: Not on file  Occupational History  . Not on file  Social Needs  . Financial resource strain: Not on file  . Food insecurity:    Worry: Not on file    Inability: Not on file  .  Transportation needs:    Medical: Not on file    Non-medical: Not on file  Tobacco Use  . Smoking status: Never Smoker  . Smokeless tobacco: Never Used  Substance and Sexual Activity  . Alcohol use: No  . Drug use: No  . Sexual activity: Not on file  Lifestyle  . Physical activity:    Days per week: Not on file    Minutes per session: Not on file  . Stress: Not on file  Relationships  . Social connections:    Talks on phone: Not on file    Gets together: Not on file     Attends religious service: Not on file    Active member of club or organization: Not on file    Attends meetings of clubs or organizations: Not on file    Relationship status: Not on file  . Intimate partner violence:    Fear of current or ex partner: Not on file    Emotionally abused: Not on file    Physically abused: Not on file    Forced sexual activity: Not on file  Other Topics Concern  . Not on file  Social History Narrative  . Not on file    FAMILY HISTORY: Her half sister was diagnosed with breast cancer 20 years ago. ALLERGIES:  is allergic to eggs or egg-derived products; other; and penicillins.  MEDICATIONS:  Current Outpatient Medications  Medication Sig Dispense Refill  . acyclovir (ZOVIRAX) 400 MG tablet Take 400 mg by mouth 2 (two) times daily.     . B Complex-C (B-COMPLEX WITH VITAMIN C) tablet Take 1 tablet by mouth daily.    . cetirizine (ZYRTEC) 10 MG tablet Take 10 mg by mouth daily.    . Cholecalciferol (VITAMIN D) 2000 UNITS tablet Take 2,000 Units by mouth daily.    . metoprolol succinate (TOPROL-XL) 25 MG 24 hr tablet Take 25 mg by mouth daily.  3  . zolpidem (AMBIEN) 10 MG tablet Take 10 mg by mouth at bedtime as needed for sleep.     No current facility-administered medications for this visit.     REVIEW OF SYSTEMS:   Constitutional: Denies fevers, chills or abnormal night sweats Eyes: Denies blurriness of vision, double vision or watery eyes Ears, nose, mouth, throat, and face: Denies mucositis or sore throat Respiratory: Denies cough, dyspnea or wheezes Cardiovascular: Denies palpitation, chest discomfort or lower extremity swelling Gastrointestinal:  Denies nausea, heartburn or change in bowel habits Skin: Denies abnormal skin rashes Lymphatics: Denies new lymphadenopathy or easy bruising Neurological:Denies numbness, tingling or new weaknesses Behavioral/Psych: Mood is stable, no new changes  Breast:  Denies any palpable lumps or discharge All  other systems were reviewed with the patient and are negative.  PHYSICAL EXAMINATION: ECOG PERFORMANCE STATUS: 0 - Asymptomatic  LABORATORY DATA:  I have reviewed the data as listed Lab Results  Component Value Date   WBC 7.3 06/26/2017   HGB 11.9 (L) 06/26/2017   HCT 37.1 06/26/2017   MCV 80.1 06/26/2017   PLT 236 06/26/2017   Lab Results  Component Value Date   NA 140 06/26/2017   K 3.7 06/26/2017   CL 108 06/26/2017   CO2 26 06/26/2017    RADIOGRAPHIC STUDIES: I have personally reviewed the radiological reports and agreed with the findings in the report.  ASSESSMENT AND PLAN:  Malignant neoplasm of upper-inner quadrant of left breast in female, estrogen receptor positive ([place]) 03/24/2018:Screening detected left breast mass 8 mm upper inner quadrant biopsy  revealed grade 2 IDC with DCIS ER 95%, PR 0%, Ki-67 20%, HER-2 -1+ by IHC, T1 BN 0 stage Ia clinical stage  Pathology and radiology counseling:Discussed with the patient, the details of pathology including the type of breast cancer,the clinical staging, the significance of ER, PR and HER-2/neu receptors and the implications for treatment. After reviewing the pathology in detail, we proceeded to discuss the different treatment options between surgery, radiation, chemotherapy, antiestrogen therapies.  Recommendations: 1. Breast conserving surgery followed by 2. Oncotype DX testing to determine if chemotherapy would be of any benefit followed by 3. Adjuvant radiation therapy followed by 4. Adjuvant antiestrogen therapy  Oncotype counseling: I discussed Oncotype DX test. I explained to the patient that this is a 21 gene panel to evaluate patient tumors DNA to calculate recurrence score. This would help determine whether patient has high risk or intermediate risk or low risk breast cancer. She understands that if her tumor was found to be high risk, she would benefit from systemic chemotherapy. If low risk, no need of chemotherapy.  If she was found to be intermediate risk, we would need to evaluate the score as well as other risk factors and determine if an abbreviated chemotherapy may be of benefit.  Patient sister had a diagnosis of breast cancer and apparently has taken holistic treatments with herbs and dietary modifications.  She was wondering if her sister can do the same. I mentioned to them that my goal is to provide them with adequate information to make their decisions.  If for some reason her surgery gets delayed, then we can start her on anastrozole therapy until her surgery can happen.  Return to clinic after surgery to discuss final pathology report and then determine if Oncotype DX testing will need to be sent.   All questions were answered. The patient knows to call the clinic with any problems, questions or concerns.   Nicholas Lose, MD 04/16/2018   I, Cloyde Reams Dorshimer, am acting as scribe for Nicholas Lose, MD.  I have reviewed the above documentation for accuracy and completeness, and I agree with the above.

## 2018-04-15 NOTE — Progress Notes (Addendum)
Location of Breast Cancer: Left Breast  Histology per Pathology Report:  03/24/18 performed at [facility].    Receptor Status: ER(95%), PR (NEG), Her2-neu (NEG), Ki-(20%)  Did patient present with symptoms (if so, please note symptoms) or was this found on screening mammography?: It was found on a screening mammogram.   Past/Anticipated interventions by surgeon, if any: She saw Dr. Dalbert Batman on 04/02/18 and will see him again approximately 2 weeks from that date.  Her surgery is scheduled for 05/06/18   Past/Anticipated interventions by medical oncology, if any:  04/16/18 Dr. Lindi Adie.  Recommendations: 1. Breast conserving surgery followed by 2. Oncotype DX testing to determine if chemotherapy would be of any benefit followed by 3. Adjuvant radiation therapy followed by 4. Adjuvant antiestrogen therapy  Lymphedema issues, if any:  She denies.   Pain issues, if any:  She denies.   SAFETY ISSUES:  Prior radiation? No  Pacemaker/ICD? No  Possible current pregnancy? No  Is the patient on methotrexate? No  Current Complaints / other details:       Jahziel Sinn, Stephani Police, RN 04/15/2018,4:36 PM

## 2018-04-16 ENCOUNTER — Inpatient Hospital Stay: Payer: Medicare HMO | Attending: Hematology and Oncology | Admitting: Hematology and Oncology

## 2018-04-16 DIAGNOSIS — C50212 Malignant neoplasm of upper-inner quadrant of left female breast: Secondary | ICD-10-CM | POA: Diagnosis not present

## 2018-04-16 DIAGNOSIS — Z803 Family history of malignant neoplasm of breast: Secondary | ICD-10-CM

## 2018-04-16 DIAGNOSIS — I10 Essential (primary) hypertension: Secondary | ICD-10-CM | POA: Diagnosis not present

## 2018-04-16 DIAGNOSIS — Z170 Estrogen receptor positive status [ER+]: Secondary | ICD-10-CM | POA: Diagnosis not present

## 2018-04-16 NOTE — Assessment & Plan Note (Signed)
03/24/2018:Screening detected left breast mass 8 mm upper inner quadrant biopsy revealed grade 2 IDC with DCIS ER 95%, PR 0%, Ki-67 20%, HER-2 -1+ by IHC, T1 BN 0 stage Ia clinical stage  Pathology and radiology counseling:Discussed with the patient, the details of pathology including the type of breast cancer,the clinical staging, the significance of ER, PR and HER-2/neu receptors and the implications for treatment. After reviewing the pathology in detail, we proceeded to discuss the different treatment options between surgery, radiation, chemotherapy, antiestrogen therapies.  Recommendations: 1. Breast conserving surgery followed by 2. Oncotype DX testing to determine if chemotherapy would be of any benefit followed by 3. Adjuvant radiation therapy followed by 4. Adjuvant antiestrogen therapy  Oncotype counseling: I discussed Oncotype DX test. I explained to the patient that this is a 21 gene panel to evaluate patient tumors DNA to calculate recurrence score. This would help determine whether patient has high risk or intermediate risk or low risk breast cancer. She understands that if her tumor was found to be high risk, she would benefit from systemic chemotherapy. If low risk, no need of chemotherapy. If she was found to be intermediate risk, we would need to evaluate the score as well as other risk factors and determine if an abbreviated chemotherapy may be of benefit.  Patient sister had a diagnosis of breast cancer and apparently has taken holistic treatments with herbs and dietary modifications.  She was wondering if her sister can do the same. I mentioned to them that my goal is to provide them with adequate information to make their decisions.  Return to clinic after surgery to discuss final pathology report and then determine if Oncotype DX testing will need to be sent.

## 2018-04-17 ENCOUNTER — Telehealth: Payer: Self-pay | Admitting: Radiation Oncology

## 2018-04-17 ENCOUNTER — Other Ambulatory Visit: Payer: Self-pay | Admitting: General Surgery

## 2018-04-17 DIAGNOSIS — Z9049 Acquired absence of other specified parts of digestive tract: Secondary | ICD-10-CM | POA: Diagnosis not present

## 2018-04-17 DIAGNOSIS — C50212 Malignant neoplasm of upper-inner quadrant of left female breast: Secondary | ICD-10-CM | POA: Diagnosis not present

## 2018-04-17 DIAGNOSIS — Z803 Family history of malignant neoplasm of breast: Secondary | ICD-10-CM | POA: Diagnosis not present

## 2018-04-17 DIAGNOSIS — Z170 Estrogen receptor positive status [ER+]: Principal | ICD-10-CM

## 2018-04-17 DIAGNOSIS — K219 Gastro-esophageal reflux disease without esophagitis: Secondary | ICD-10-CM | POA: Diagnosis not present

## 2018-04-17 NOTE — Telephone Encounter (Signed)
New message:      Lft message with patient to see if we can set up a webex/telephone encounter for appt on 03/31

## 2018-04-20 ENCOUNTER — Encounter: Payer: Self-pay | Admitting: *Deleted

## 2018-04-20 ENCOUNTER — Other Ambulatory Visit: Payer: Self-pay | Admitting: General Surgery

## 2018-04-20 DIAGNOSIS — C50212 Malignant neoplasm of upper-inner quadrant of left female breast: Secondary | ICD-10-CM

## 2018-04-20 DIAGNOSIS — Z170 Estrogen receptor positive status [ER+]: Principal | ICD-10-CM

## 2018-04-21 ENCOUNTER — Ambulatory Visit: Payer: Self-pay | Admitting: Radiation Oncology

## 2018-04-21 ENCOUNTER — Ambulatory Visit
Admission: RE | Admit: 2018-04-21 | Discharge: 2018-04-21 | Disposition: A | Discharge status: Home / Self Care | Payer: Medicare HMO | Source: Ambulatory Visit | Attending: Radiation Oncology | Admitting: Radiation Oncology

## 2018-04-21 ENCOUNTER — Other Ambulatory Visit: Payer: Self-pay

## 2018-04-21 ENCOUNTER — Encounter: Payer: Self-pay | Admitting: Radiation Oncology

## 2018-04-21 ENCOUNTER — Ambulatory Visit: Payer: Self-pay

## 2018-04-21 DIAGNOSIS — C50212 Malignant neoplasm of upper-inner quadrant of left female breast: Secondary | ICD-10-CM

## 2018-04-21 DIAGNOSIS — Z170 Estrogen receptor positive status [ER+]: Principal | ICD-10-CM

## 2018-04-21 NOTE — Addendum Note (Signed)
Encounter addended by: Latona Krichbaum, Stephani Police, RN on: 04/21/2018 3:38 PM  Actions taken: Charge Capture section accepted

## 2018-04-21 NOTE — Progress Notes (Signed)
Radiation Oncology         [phone] ________________________________  Initial TELEMEDICINE Consultation  Name: Kimberly Mccormick MRN: [id number]  Date: 04/21/2018  DOB: 1950/11/04  ER:[id number], Virgina Evener, MD  Fanny Skates, MD   REFERRING PHYSICIAN: Fanny Skates, MD  DIAGNOSIS:    ICD-10-CM   1. Malignant neoplasm of upper-inner quadrant of left breast in female, estrogen receptor positive ([place]) C50.212    Z17.0    Clinical Stage IA, T1bN0 Left Breast UIQ Invasive Ductal Carcinoma, ER(+) / PR(-) / Her2(-), Grade 2  CHIEF COMPLAINT: Here to discuss management of left breast cancer  HISTORY OF PRESENT ILLNESS::Kimberly Mccormick is a 68 y.o. female who presented with left breast abnormality on screening mammogram dated 02/17/2018.    Diagnostic mammogram and Ultrasound of the left breast on 03/11/2018 revealed a suspicious 0.8 cm mass within the upper inner left breast. No abnormal appearing left axillary lymph nodes. Biopsy on date of 03/24/2018 showed invasive ductal carcinoma, extending to 0.7 cm in maximal extent and involving four of four biopsy specimens, with ductal carcinoma in situ. ER status: 95% positive, PR negative, Her2 status: negative for Her2 (1+); Grade 2.   The patient has met with Dr. Dalbert Batman and is scheduled for left breast lumpectomy with left deep axillary sentinel lymph node biopsy on 05/06/2018. Of note, the patient has a history of benign left breast surgery in 2005. Following surgery, Dr. Lindi Adie plans for Oncotype DX testing to determine if chemotherapy would be of any benefit.  The patient has been referred today for discussion of adjuvant radiation treatment.  PREVIOUS RADIATION THERAPY: No  PAST MEDICAL HISTORY:  has a past medical history of Anemia (1970), Genital herpes, GERD (gastroesophageal reflux disease), Hypertension, Otitis media, and Sinus infection (01/04/14).    PAST SURGICAL HISTORY: Past Surgical History:  Procedure Laterality Date   . APPENDECTOMY    . CATARACT EXTRACTION W/PHACO Left 01/10/2014   Procedure: CATARACT EXTRACTION PHACO AND INTRAOCULAR LENS PLACEMENT ; CDE:  4.94;  Surgeon: Williams Che, MD;  Location: [facility];  Service: Ophthalmology;  Laterality: Left;  . CESAREAN SECTION    . CHOLECYSTECTOMY    . MYRINGOTOMY WITH TUBE PLACEMENT Right 02/04/2017   Procedure: REVISION OF RIGHT MYRINGOTOMY WITH TUBE PLACEMENT, WITH EXAM OF LEFT EAR;  Surgeon: Leta Baptist, MD;  Location: [place];  Service: ENT;  Laterality: Right;  . NASAL SINUS SURGERY    . TONSILLECTOMY      FAMILY HISTORY: family history is not on file.  SOCIAL HISTORY:  reports that she has never smoked. She has never used smokeless tobacco. She reports that she does not drink alcohol or use drugs.  ALLERGIES: Eggs or egg-derived products; Other; and Penicillins  MEDICATIONS:  Current Outpatient Medications  Medication Sig Dispense Refill  . acyclovir (ZOVIRAX) 400 MG tablet Take 400 mg by mouth 2 (two) times daily.     . B Complex-C (B-COMPLEX WITH VITAMIN C) tablet Take 1 tablet by mouth daily.    . cetirizine (ZYRTEC) 10 MG tablet Take 10 mg by mouth daily.    . Cholecalciferol (VITAMIN D) 2000 UNITS tablet Take 2,000 Units by mouth daily.    . metoprolol succinate (TOPROL-XL) 25 MG 24 hr tablet Take 25 mg by mouth daily.  3  . Multiple Vitamins-Minerals (MULTIVITAMIN WITH MINERALS) tablet Take 1 tablet by mouth daily.    Marland Kitchen omeprazole (PRILOSEC) 40 MG capsule Take by mouth.    . zolpidem (AMBIEN) 10 MG  tablet Take 10 mg by mouth at bedtime as needed for sleep.     No current facility-administered medications for this encounter.     REVIEW OF SYSTEMS: as above  PHYSICAL EXAM: N/A   LABORATORY DATA:  Lab Results  Component Value Date   WBC 7.3 06/26/2017   HGB 11.9 (L) 06/26/2017   HCT 37.1 06/26/2017   MCV 80.1 06/26/2017   PLT 236 06/26/2017   CMP     Component Value Date/Time   NA 140 06/26/2017 1845   K 3.7  06/26/2017 1845   CL 108 06/26/2017 1845   CO2 26 06/26/2017 1845   GLUCOSE 86 06/26/2017 1845   BUN 18 06/26/2017 1845   CREATININE 1.03 (H) 06/26/2017 1845   CALCIUM 8.7 (L) 06/26/2017 1845   PROT 6.9 06/26/2017 1845   ALBUMIN 3.6 06/26/2017 1845   AST 20 06/26/2017 1845   ALT 13 (L) 06/26/2017 1845   ALKPHOS 94 06/26/2017 1845   BILITOT 0.9 06/26/2017 1845   GFRNONAA 54 (L) 06/26/2017 1845   GFRAA >60 06/26/2017 1845        RADIOGRAPHY: as above    IMPRESSION/PLAN: Left Breast Cancer     For the patient's early stage favorable risk breast cancer, we had a thorough discussion about her options for adjuvant therapy. Chemotherapy is to be determined.  Regardless, she will benefit from antiestrogen therapy as discussed with medical oncology. She would take a pill for approximately 5 years. Adding radiotherapy to the breast would further reduce risk of in-breast recurrence.   Of note, I discussed the data from the [employer] al trial in the [employer] [employer]. She understands that tamoxifen compared to radiation plus tamoxifen demonstrated no survival benefit among the women in this study. The women were 38 years or older with stage I estrogen receptor positive breast cancer. Extrapolating from this study, I told Kimberly Mccormick that her overall life expectancy should not be affected by adding radiotherapy to antiestrogen medication. She understands that the main benefit of radiotherapy would be a very small but measurable local control benefit (risk of local recurrence to be lowered from ~10% --> ~2%).   She and her sister(present on the call) are not interested in radiotherapy and prefer to streamline her treatment. I think this is very prudent, especially during the pandemic when staying at home is safest.  We discussed measures to reduce the risk of infection during the COVID-19 pandemic.   I will see her PRN, only if the pathology is more ominous than expected.  This encounter  was provided by telephone as patient could not maneuver a telemedicine platform such as Webex.  The patient has given verbal consent for this type of encounter. The time spent during this encounter was 15 minutes. The attendants for this meeting include Eppie Gibson  and Darci Needle.  During the encounter, Eppie Gibson was located at [facility] Radiation Oncology Department.  Kimberly Mccormick was located at home.     __________________________________________   Eppie Gibson, MD  This document serves as a record of services personally performed by Eppie Gibson, MD. It was created on her behalf by Rae Lips, a trained medical scribe. The creation of this record is based on the scribe's personal observations and the provider's statements to them. This document has been checked and approved by the attending provider.

## 2018-04-21 NOTE — Progress Notes (Signed)
Location of Breast Cancer: Left Breast  Histology per Pathology Report:  03/24/18 performed at [facility].    Receptor Status: ER(95%), PR (NEG), Her2-neu (NEG), Ki-(20%)  Did patient present with symptoms (if so, please note symptoms) or was this found on screening mammography?:   Past/Anticipated interventions by surgeon, if any:  Past/Anticipated interventions by medical oncology, if any:   Lymphedema issues, if any:    Pain issues, if any:    SAFETY ISSUES:  Prior radiation?   Pacemaker/ICD?   Possible current pregnancy?  Is the patient on methotrexate?   Current Complaints / other details:      Kenika Sahm, Stephani Police, RN 04/21/2018,12:45 PM

## 2018-04-28 ENCOUNTER — Other Ambulatory Visit: Payer: Self-pay

## 2018-04-28 ENCOUNTER — Encounter (HOSPITAL_BASED_OUTPATIENT_CLINIC_OR_DEPARTMENT_OTHER): Payer: Self-pay | Admitting: *Deleted

## 2018-04-30 NOTE — H&P (Signed)
[name] Location: [facility] Patient #: [id number] DOB: Jun 08, 1950 Single / Language: Kimberly Mccormick / Race: Black or African American Female   History of Present Illness       This is a 68 year old female from [place]. She is here with her sister to have a second visit and to plan definitive surgery for her left breast cancer. Her PCP is Florene Route. She has now seen Dr. Lindi Adie of the medical oncology group.      She gets annual mammograms and recent screening mammogram shows an 8 mm mass in the left breast, upper inner quadrant. Axillary ultrasound was negative. Image guided biopsy left breast mass shows grade 2 invasive duct carcinoma and DCIS. ER 95%. PR 0. HER-2 negative. Ki-67 20% She has seen Dr. Lindi Adie who agrees with surgical approach and plans Oncotype. She emphatically stated that she wanted her initial medical oncology and radiation oncology consults at [facility]. She is open to radiation therapy being delivered in [place].      Comorbidities include appendectomy, cholecystectomy. C-section. GERD. Hypertension. Otherwise no major medical problems Family history reveals half-sister on father's side had breast cancer and is a survivor. Mother had lupus. Father had heart attack. No cancer syndromes. Socially she lives in [place] with her sister. 2 sons. Separated. Denies alcohol or tobacco. Retired Therapist, sports      She is very interested in breast conservation and I think she is a good candidate for that she'll be scheduled for left breast lumpectomy with radioactive seed localization, injection blue dye, left axillary deep sentinel lymph node biopsy. I discussed the indications, details, techniques, and he was risk of the surgery with them both. She is aware of the risk of bleeding, infection, reoperation for positive margins or multiple positive nodes, cosmetic deformity, chronic pain, shoulder stability. Arm numbness. She understands  all these issues. All of her questions were answered. She agrees with this plan.       Most likely she will receive radiation therapy in [place].  Addendum Note I called this patient today to see how she was doing in preparation for her breast cancer surgery next Wednesday She is doing very well and is very interested and anxious to go ahead with the surgery at this time I told her that was appropriate and the safety profile was good at this time She knows that she will go home the same day She is feeling well Seed placement is scheduled for Monday All of her questions were answered   Allergies Penicillins  Allergies Reconciled   Medication History  Acyclovir ('400MG'$  Tablet, Oral) Active. Metoprolol Succinate ER ('25MG'$  Tablet ER 24HR, Oral) Active. Cetirizine HCl ('10MG'$  Tablet, Oral) Active. Zolpidem Tartrate ('10MG'$  Tablet, Oral) Active. Vitamin D3 (2000UNIT Tablet, Oral) Active. B Complete (Oral) Active. Omeprazole ('40MG'$  Capsule DR, Oral) Active. Medications Reconciled  Vitals  Weight: 137.2 lb Height: 63in Body Surface Area: 1.65 m Body Mass Index: 24.3 kg/m  Temp.: 98.83F  Pulse: 74 (Regular)  BP: 148/84 (Sitting, Left Arm, Standard)       Physical Exam  General Mental Status-Alert. General Appearance-Not in acute distress. Build & Nutrition-Well nourished. Posture-Normal posture. Gait-Normal.  Head and Neck Head-normocephalic, atraumatic with no lesions or palpable masses. Trachea-midline. Thyroid Gland Characteristics - normal size and consistency and no palpable nodules.  Chest and Lung Exam Chest and lung exam reveals -on auscultation, normal breath sounds, no adventitious sounds and normal vocal resonance.  Breast Note: Breasts are medium size. Skin healthy. Ecchymoses  have resolved. I don't feel a mass or adenopathy on either side.   Cardiovascular Cardiovascular examination reveals -normal heart sounds,  regular rate and rhythm with no murmurs and femoral artery auscultation bilaterally reveals normal pulses, no bruits, no thrills.  Abdomen Inspection Inspection of the abdomen reveals - No Hernias. Palpation/Percussion Palpation and Percussion of the abdomen reveal - Soft, Non Tender, No Rigidity (guarding), No hepatosplenomegaly and No Palpable abdominal masses.  Neurologic Neurologic evaluation reveals -alert and oriented x 3 with no impairment of recent or remote memory, normal attention span and ability to concentrate, normal sensation and normal coordination.  Neuropsychiatric Note: Very soft spoken and somewhat reticent. She has good understanding and insight.   Musculoskeletal Normal Exam - Bilateral-Upper Extremity Strength Normal and Lower Extremity Strength Normal.    Assessment & Plan  PRIMARY CANCER OF UPPER INNER QUADRANT OF LEFT FEMALE BREAST (C50.212)    You have been evaluated by Dr. Lindi Adie of the medical oncology Department He has discussed medical treatment of her breast cancer which will include radiation therapy, possibly antiestrogen therapy, possible chemotherapy Those decisions will come after surgery you also stated you have an appointment with one of the radiation oncologist. Be sure to keep that appointment  You stated that you would like to keep your breast if possible and I think you are an excellent candidate for that you'll be scheduled for injection blue dye left breast, left breast lumpectomy with radioactive seed localization, left axillary deep sentinel lymph node biopsy. I've discussed the indications, techniques, and risks of the surgery in detail with you and your sister We will schedule as soon as possible  FAMILY HISTORY OF BREAST CANCER IN SISTER (Z80.3) HISTORY OF CHOLECYSTECTOMY (Z90.49) CHRONIC GERD (K21.9)    Darin Arndt M. Dalbert Batman, M.D., [facility] General and Minimally invasive Surgery Breast and  Colorectal Surgery Office:   [phone] Pager:   [phone]

## 2018-05-04 ENCOUNTER — Ambulatory Visit
Admission: RE | Admit: 2018-05-04 | Discharge: 2018-05-04 | Disposition: A | Discharge status: Home / Self Care | Payer: Medicare HMO | Source: Ambulatory Visit | Attending: General Surgery | Admitting: General Surgery

## 2018-05-04 ENCOUNTER — Other Ambulatory Visit: Payer: Self-pay

## 2018-05-04 DIAGNOSIS — C50212 Malignant neoplasm of upper-inner quadrant of left female breast: Secondary | ICD-10-CM | POA: Diagnosis not present

## 2018-05-04 DIAGNOSIS — Z170 Estrogen receptor positive status [ER+]: Principal | ICD-10-CM

## 2018-05-04 IMAGING — MG NEEDLE LOCALIZATION OF THE LEFT BREAST WITH MAMMO GUIDANCE
8 series · 8 of 8 positions shown · non-contrast
Comparison: Previous exam(s).

CLINICAL DATA: Patient presents for radioactive seed localization
of a left breast invasive ductal carcinoma.

EXAM:
MAMMOGRAPHIC GUIDED RADIOACTIVE SEED LOCALIZATION OF THE LEFT BREAST

[L ML (1 of 5)]
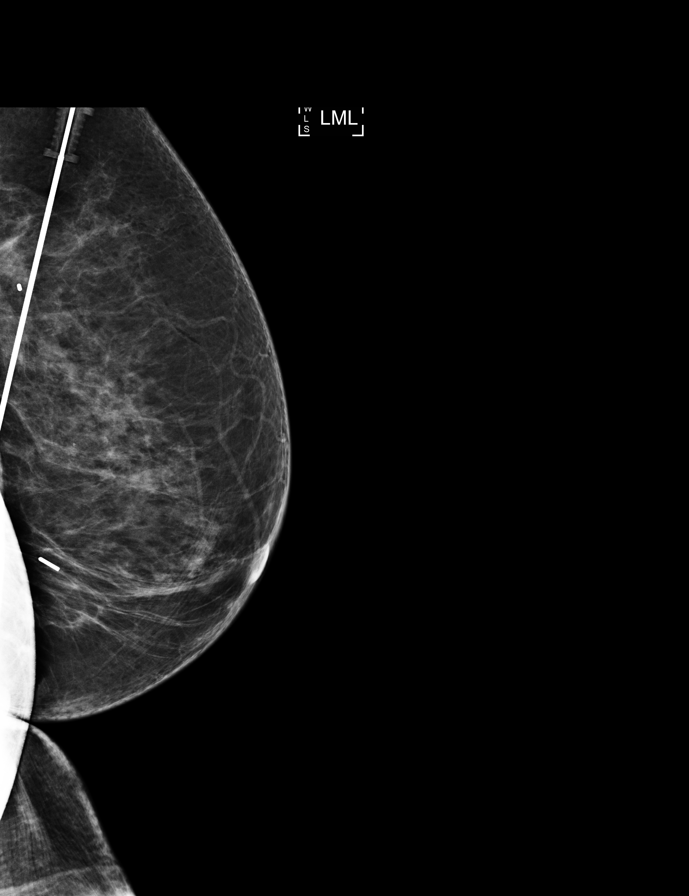

[L ML (2 of 5)]
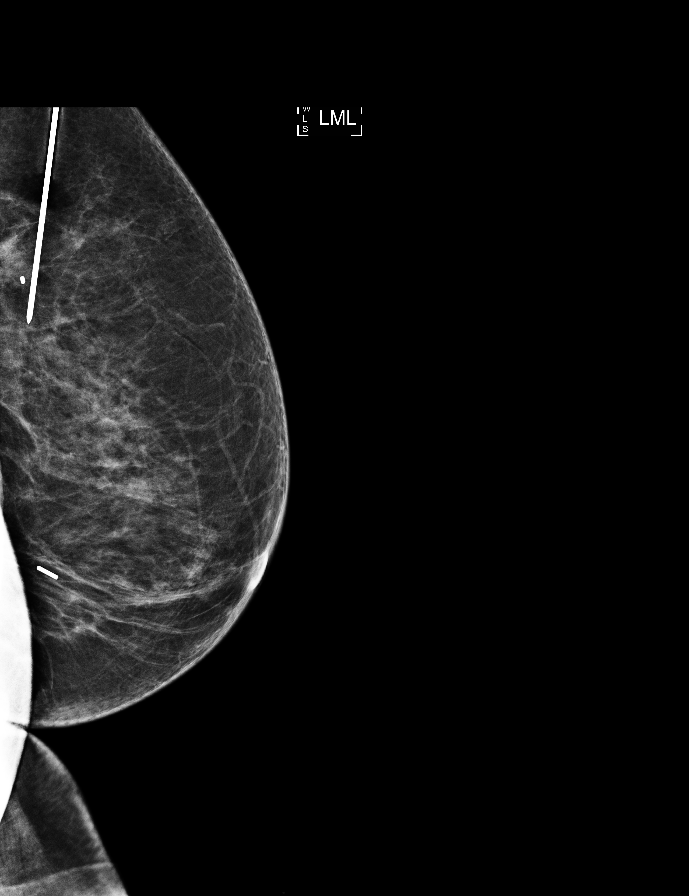

[L ML (3 of 5)]
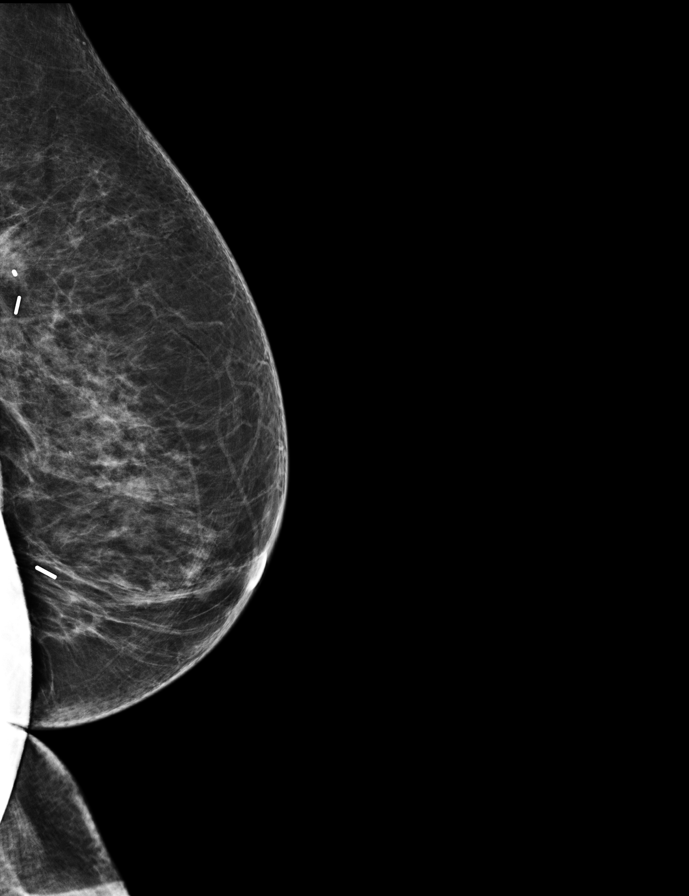

[L ML (4 of 5)]
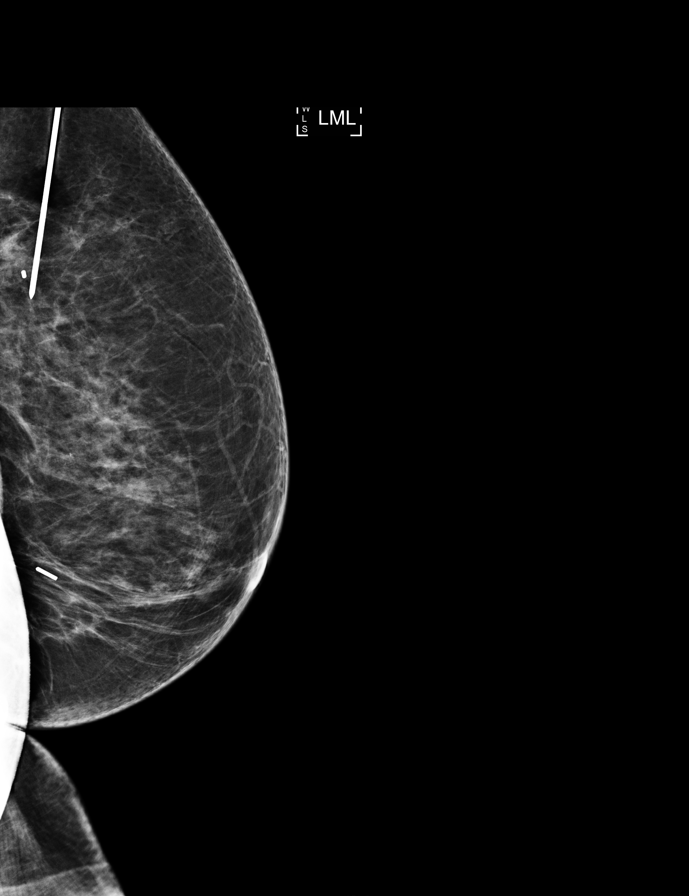

[L CC (1 of 3)]
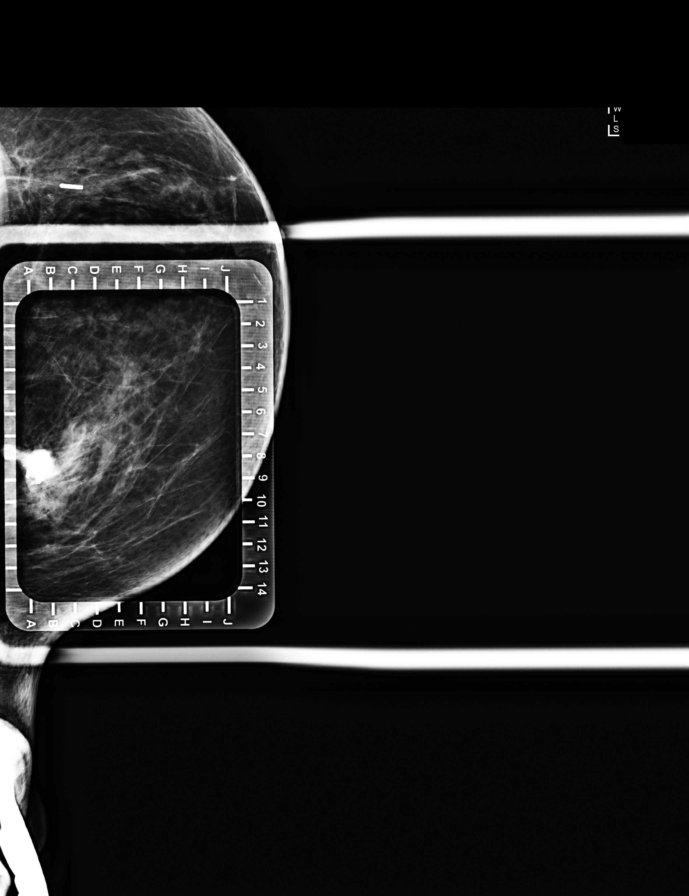

[L CC (2 of 3)]
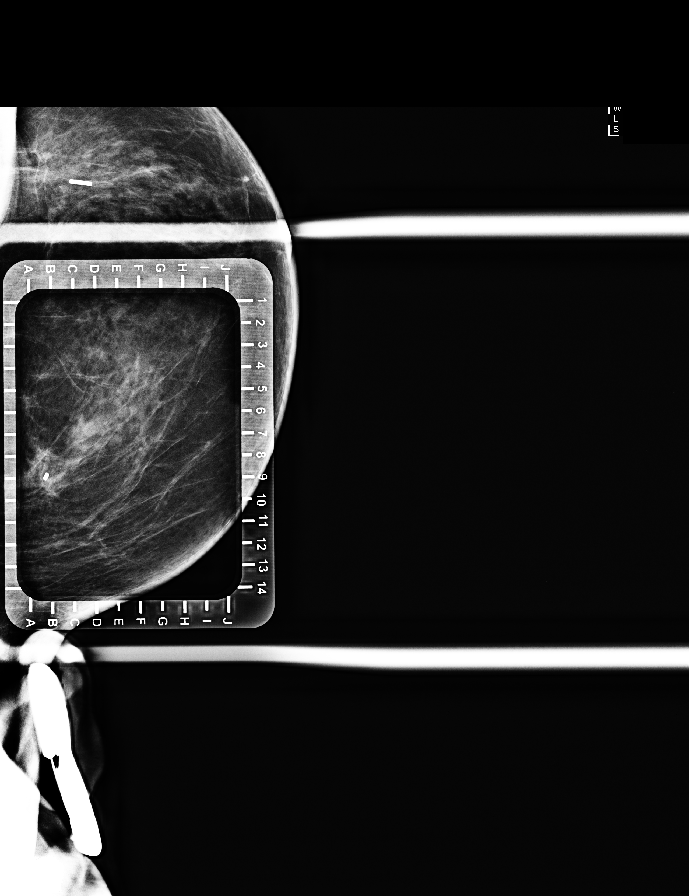

[L CC (3 of 3)]
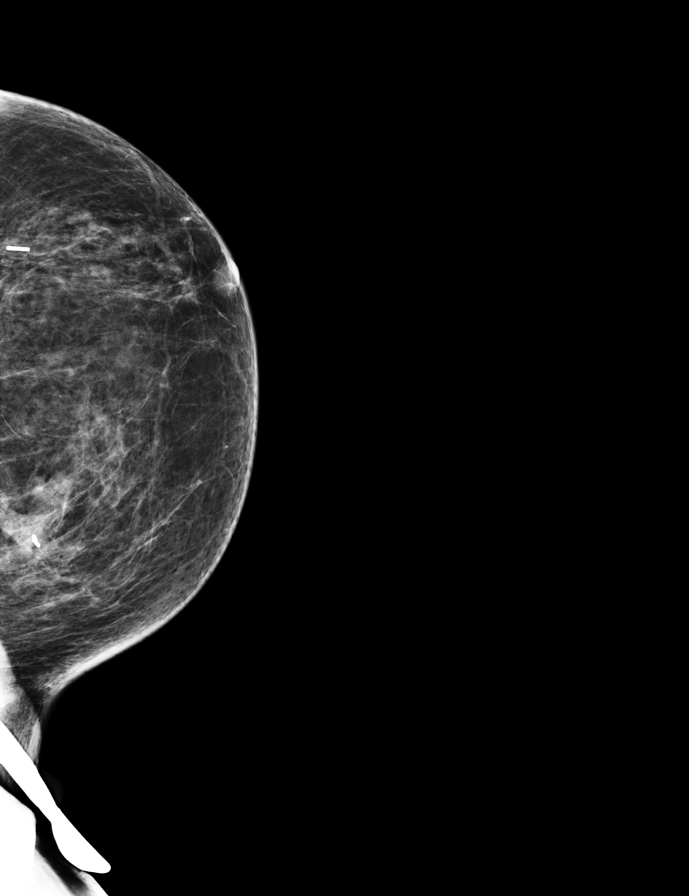

[L ML (5 of 5)]
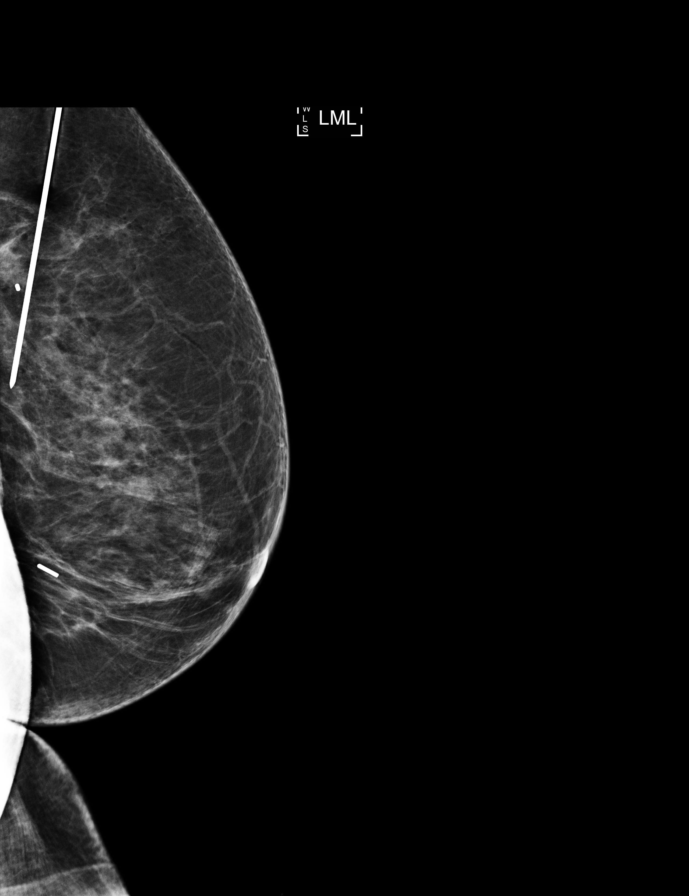

[8 of 8 positions shown; findings below may reference images not displayed]

FINDINGS: Patient presents for radioactive seed localization prior to surgical
excision. I met with the patient and we discussed the procedure of
seed localization including benefits and alternatives. We discussed
the high likelihood of a successful procedure. We discussed the
risks of the procedure including infection, bleeding, tissue injury
and further surgery. We discussed the low dose of radioactivity
involved in the procedure. Informed, written consent was given.

The usual time-out protocol was performed immediately prior to the
procedure.

Using mammographic guidance, sterile technique, 1% lidocaine and an
[CN] radioactive seed, cylinder shaped biopsy clip was localized
using a superior approach. The follow-up mammogram images confirm
the seed in the expected location and were marked for Dr. POONAM.

Follow-up survey of the patient confirms presence of the radioactive
seed.

Order number of [CN] seed:  [PHONE_NUMBER].

Total activity:  0.247 millicuries reference Date: [DATE]

The patient tolerated the procedure well and was released from the
[REDACTED]. She was given instructions regarding seed removal.
IMPRESSION: Radioactive seed localization of the left breast. No apparent
complications.

## 2018-05-04 NOTE — Progress Notes (Signed)
Pre-surgical  Ensure given along with instructions of NPO after MN other than finishing this drink at specified time. CHG soap given with instructions on how to use.

## 2018-05-06 ENCOUNTER — Ambulatory Visit (HOSPITAL_BASED_OUTPATIENT_CLINIC_OR_DEPARTMENT_OTHER): Payer: Medicare HMO | Admitting: Anesthesiology

## 2018-05-06 ENCOUNTER — Other Ambulatory Visit: Payer: Self-pay

## 2018-05-06 ENCOUNTER — Ambulatory Visit (HOSPITAL_COMMUNITY)
Admission: RE | Admit: 2018-05-06 | Discharge: 2018-05-06 | Disposition: A | Discharge status: Home / Self Care | Payer: Medicare HMO | Source: Ambulatory Visit | Attending: General Surgery | Admitting: General Surgery

## 2018-05-06 ENCOUNTER — Encounter (HOSPITAL_BASED_OUTPATIENT_CLINIC_OR_DEPARTMENT_OTHER)
Admission: RE | Disposition: A | Discharge status: Home / Self Care | Payer: Self-pay | Source: Home / Self Care | Attending: General Surgery

## 2018-05-06 ENCOUNTER — Ambulatory Visit (HOSPITAL_BASED_OUTPATIENT_CLINIC_OR_DEPARTMENT_OTHER)
Admission: RE | Admit: 2018-05-06 | Discharge: 2018-05-06 | Disposition: A | Discharge status: Home / Self Care | Payer: Medicare HMO | Attending: General Surgery | Admitting: General Surgery

## 2018-05-06 ENCOUNTER — Ambulatory Visit
Admission: RE | Admit: 2018-05-06 | Discharge: 2018-05-06 | Disposition: A | Discharge status: Home / Self Care | Payer: Medicare HMO | Source: Ambulatory Visit | Attending: General Surgery | Admitting: General Surgery

## 2018-05-06 ENCOUNTER — Encounter (HOSPITAL_BASED_OUTPATIENT_CLINIC_OR_DEPARTMENT_OTHER): Payer: Self-pay | Admitting: *Deleted

## 2018-05-06 DIAGNOSIS — Z79899 Other long term (current) drug therapy: Secondary | ICD-10-CM | POA: Diagnosis not present

## 2018-05-06 DIAGNOSIS — G8918 Other acute postprocedural pain: Secondary | ICD-10-CM | POA: Diagnosis not present

## 2018-05-06 DIAGNOSIS — Z8489 Family history of other specified conditions: Secondary | ICD-10-CM | POA: Insufficient documentation

## 2018-05-06 DIAGNOSIS — I10 Essential (primary) hypertension: Secondary | ICD-10-CM | POA: Diagnosis not present

## 2018-05-06 DIAGNOSIS — Z8249 Family history of ischemic heart disease and other diseases of the circulatory system: Secondary | ICD-10-CM | POA: Insufficient documentation

## 2018-05-06 DIAGNOSIS — K219 Gastro-esophageal reflux disease without esophagitis: Secondary | ICD-10-CM | POA: Insufficient documentation

## 2018-05-06 DIAGNOSIS — Z803 Family history of malignant neoplasm of breast: Secondary | ICD-10-CM | POA: Diagnosis not present

## 2018-05-06 DIAGNOSIS — Z880 Allergy status to penicillin: Secondary | ICD-10-CM | POA: Diagnosis not present

## 2018-05-06 DIAGNOSIS — Z9049 Acquired absence of other specified parts of digestive tract: Secondary | ICD-10-CM | POA: Diagnosis not present

## 2018-05-06 DIAGNOSIS — C50212 Malignant neoplasm of upper-inner quadrant of left female breast: Secondary | ICD-10-CM | POA: Diagnosis not present

## 2018-05-06 DIAGNOSIS — Z170 Estrogen receptor positive status [ER+]: Principal | ICD-10-CM

## 2018-05-06 DIAGNOSIS — D0512 Intraductal carcinoma in situ of left breast: Secondary | ICD-10-CM | POA: Diagnosis not present

## 2018-05-06 DIAGNOSIS — C50912 Malignant neoplasm of unspecified site of left female breast: Secondary | ICD-10-CM | POA: Diagnosis not present

## 2018-05-06 HISTORY — PX: BREAST LUMPECTOMY WITH RADIOACTIVE SEED AND SENTINEL LYMPH NODE BIOPSY: SHX6550

## 2018-05-06 IMAGING — DX BREAST SURGICAL SPECIMEN
1 series · 2 of 2 positions shown · non-contrast
Comparison: Previous exam(s).

CLINICAL DATA: Patient is post radioactive seed localization and
subsequent surgical excision left breast.

EXAM:
SPECIMEN RADIOGRAPH OF THE LEFT BREAST

[Series 2: specimen digital x-ray, derived · left · 2 of 2 slices shown]
[im 1/2]
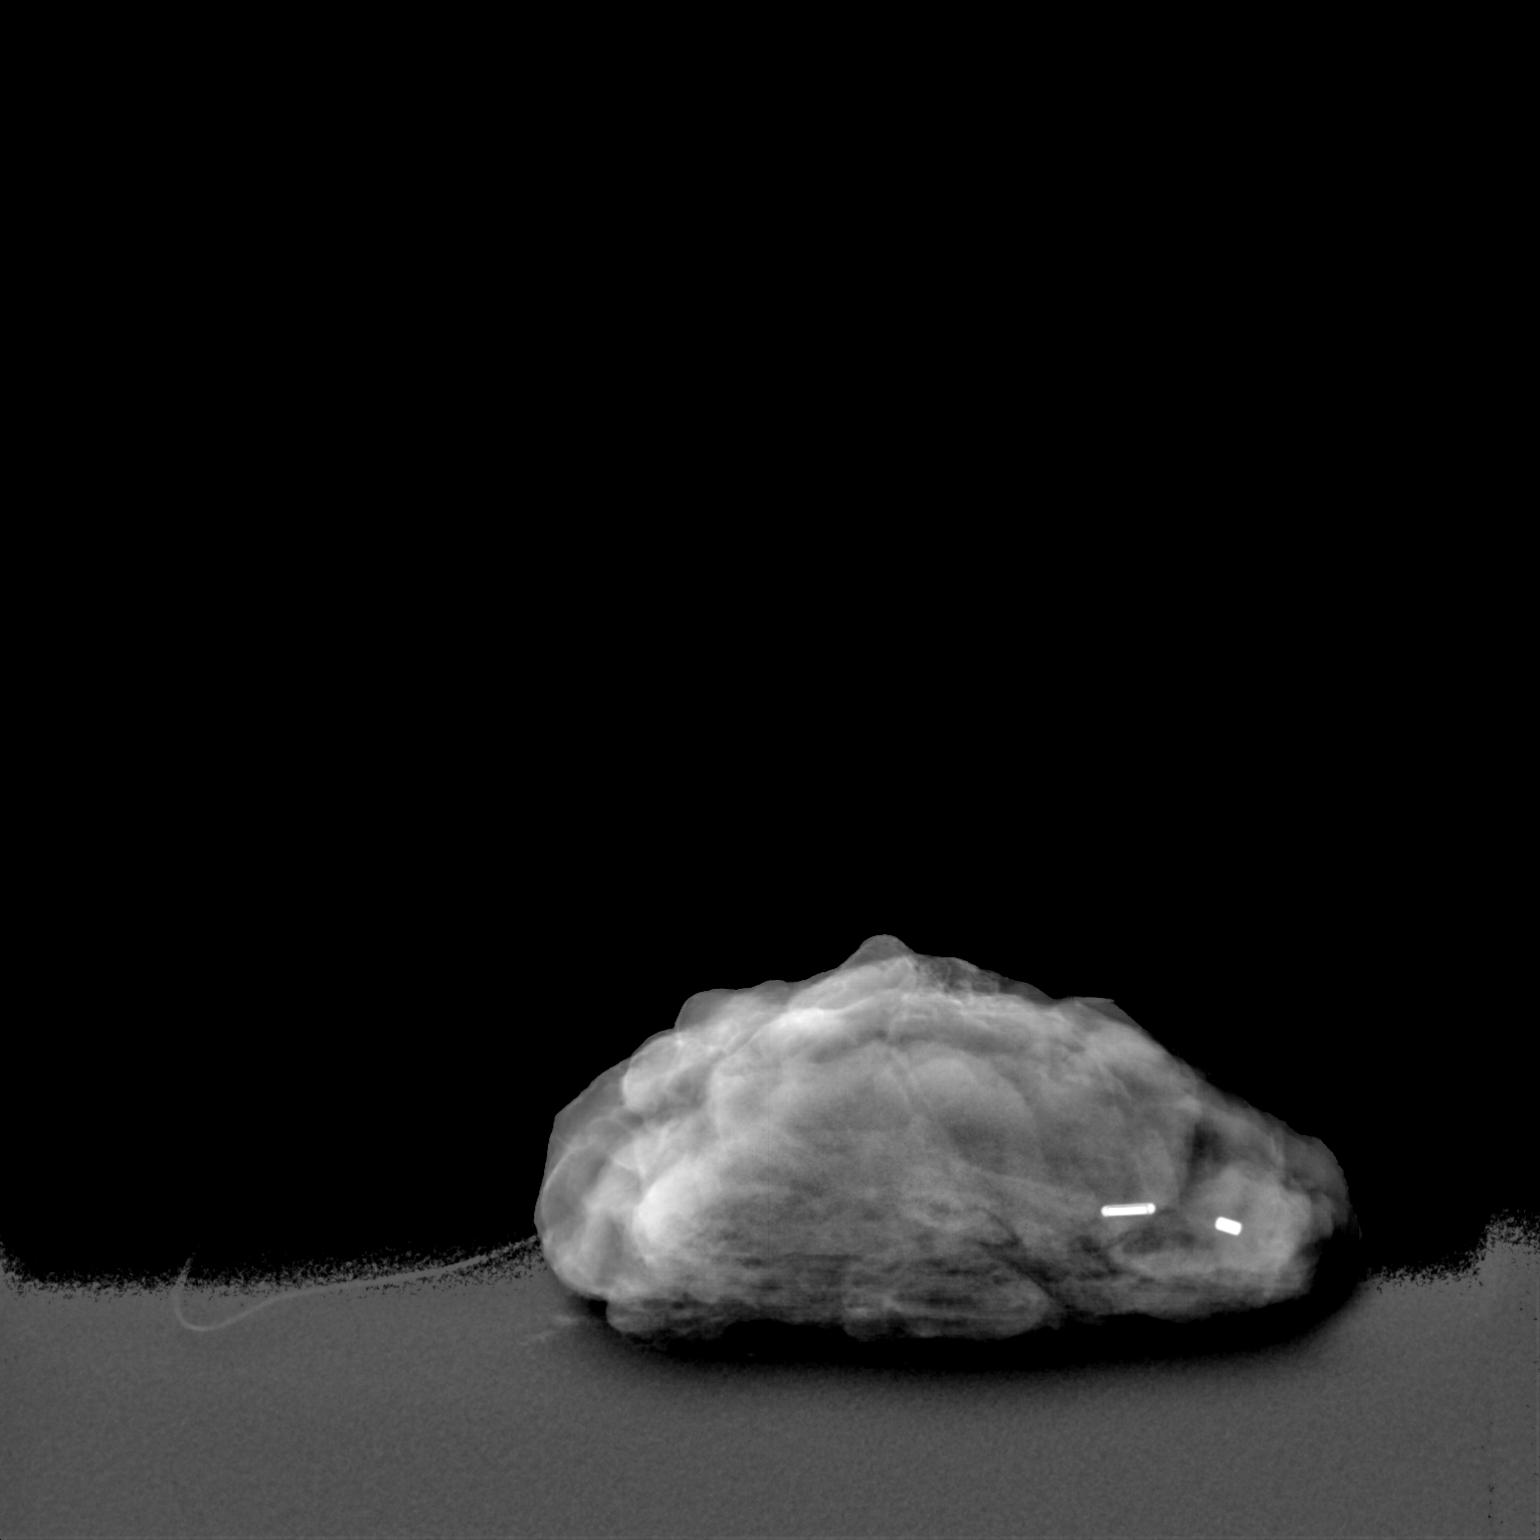
[im 2/2]
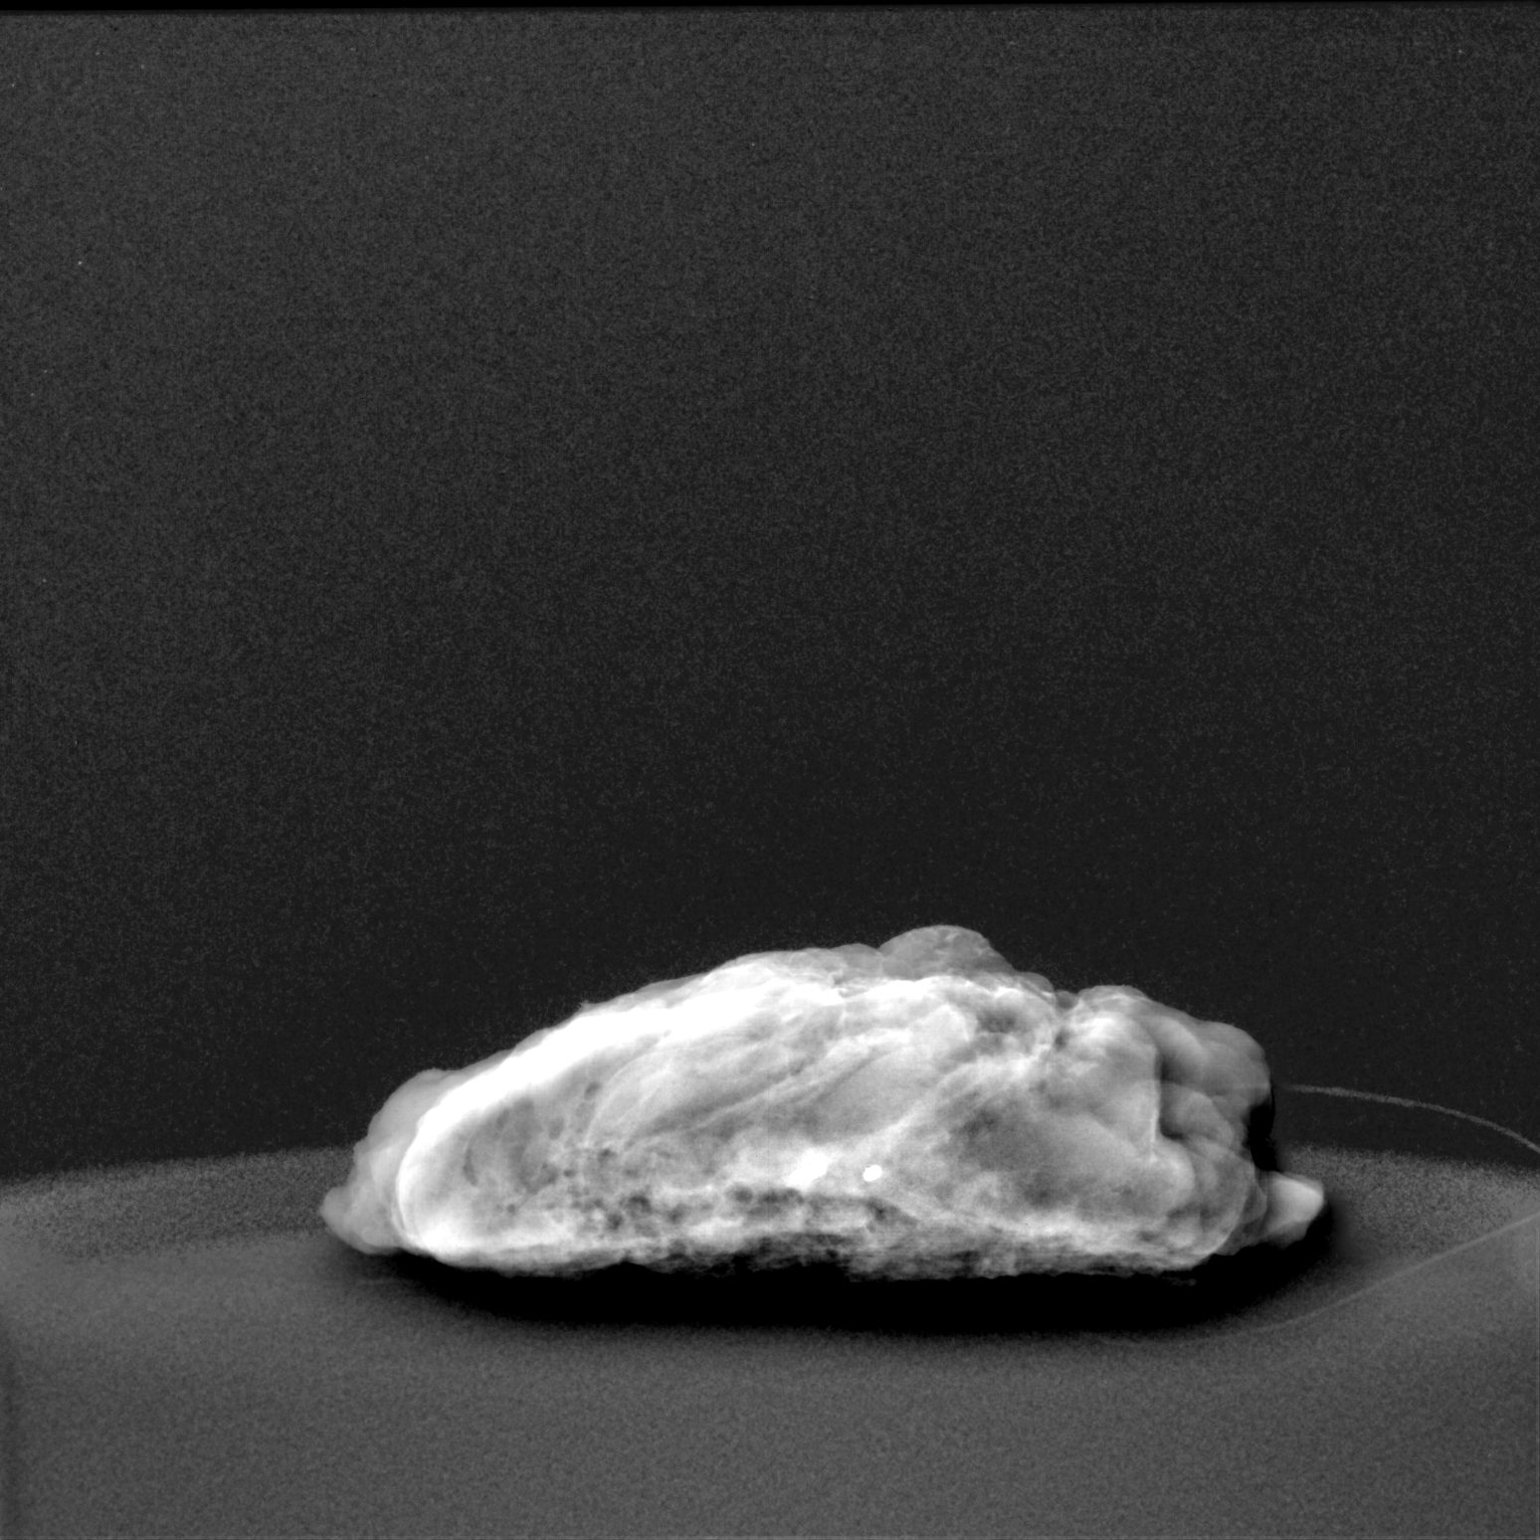

[2 of 2 positions shown; findings below may reference images not displayed]

FINDINGS: Status post excision of the left breast. The radioactive seed and
cylinder-shaped biopsy marker clip are present, completely intact,
and were marked for pathology. Results called to the OR at the time
of dictation.
IMPRESSION: Specimen radiograph of the left breast.

## 2018-05-06 SURGERY — BREAST LUMPECTOMY WITH RADIOACTIVE SEED AND SENTINEL LYMPH NODE BIOPSY
Anesthesia: General | Site: Breast | Laterality: Left

## 2018-05-06 MED ORDER — ACETAMINOPHEN 325 MG PO TABS: 325.0000 mg | ORAL_TABLET | ORAL | Status: DC | PRN

## 2018-05-06 MED ORDER — BUPIVACAINE-EPINEPHRINE 0.5% -1:200000 IJ SOLN
INTRAMUSCULAR | Status: DC | PRN
  Administered 2018-05-06: 15 mL

## 2018-05-06 MED ORDER — FENTANYL CITRATE (PF) 100 MCG/2ML IJ SOLN
INTRAMUSCULAR | Status: AC
  Filled 2018-05-06: qty 2

## 2018-05-06 MED ORDER — OXYCODONE HCL 5 MG/5ML PO SOLN: 5.0000 mg | Freq: Once | ORAL | Status: DC | PRN

## 2018-05-06 MED ORDER — FENTANYL CITRATE (PF) 100 MCG/2ML IJ SOLN
50.0000 ug | INTRAMUSCULAR | Status: DC | PRN
  Administered 2018-05-06: 100 ug via INTRAVENOUS

## 2018-05-06 MED ORDER — LACTATED RINGERS IV SOLN: INTRAVENOUS | Status: DC

## 2018-05-06 MED ORDER — FENTANYL CITRATE (PF) 100 MCG/2ML IJ SOLN: 25.0000 ug | INTRAMUSCULAR | Status: DC | PRN

## 2018-05-06 MED ORDER — PHENYLEPHRINE 40 MCG/ML (10ML) SYRINGE FOR IV PUSH (FOR BLOOD PRESSURE SUPPORT)
PREFILLED_SYRINGE | INTRAVENOUS | Status: DC | PRN
  Administered 2018-05-06: 80 ug via INTRAVENOUS

## 2018-05-06 MED ORDER — OXYCODONE HCL 5 MG PO TABS: 5.0000 mg | ORAL_TABLET | ORAL | Status: DC | PRN

## 2018-05-06 MED ORDER — BUPIVACAINE LIPOSOME 1.3 % IJ SUSP
INTRAMUSCULAR | Status: DC | PRN
  Administered 2018-05-06: 10 mL via PERINEURAL

## 2018-05-06 MED ORDER — ACETAMINOPHEN 500 MG PO TABS
ORAL_TABLET | ORAL | Status: AC
  Filled 2018-05-06: qty 2

## 2018-05-06 MED ORDER — 0.9 % SODIUM CHLORIDE (POUR BTL) OPTIME
TOPICAL | Status: DC | PRN
  Administered 2018-05-06: 15:00:00 1000 mL

## 2018-05-06 MED ORDER — CHLORHEXIDINE GLUCONATE CLOTH 2 % EX PADS: 6.0000 | MEDICATED_PAD | Freq: Once | CUTANEOUS | Status: DC

## 2018-05-06 MED ORDER — SODIUM CHLORIDE 0.9% FLUSH: 3.0000 mL | Freq: Two times a day (BID) | INTRAVENOUS | Status: DC

## 2018-05-06 MED ORDER — LACTATED RINGERS IV SOLN
INTRAVENOUS | Status: DC
  Administered 2018-05-06: 13:00:00 via INTRAVENOUS

## 2018-05-06 MED ORDER — SCOPOLAMINE 1 MG/3DAYS TD PT72: 1.0000 | MEDICATED_PATCH | Freq: Once | TRANSDERMAL | Status: DC | PRN

## 2018-05-06 MED ORDER — TECHNETIUM TC 99M SULFUR COLLOID FILTERED
1.0000 | Freq: Once | INTRAVENOUS | Status: AC | PRN
  Administered 2018-05-06: 14:00:00 1 via INTRADERMAL

## 2018-05-06 MED ORDER — MIDAZOLAM HCL 2 MG/2ML IJ SOLN
INTRAMUSCULAR | Status: AC
  Filled 2018-05-06: qty 2

## 2018-05-06 MED ORDER — HYDROCODONE-ACETAMINOPHEN 5-325 MG PO TABS: 1.0000 | ORAL_TABLET | Freq: Four times a day (QID) | ORAL | 0 refills | Status: DC | PRN

## 2018-05-06 MED ORDER — SODIUM CHLORIDE 0.9% FLUSH: 3.0000 mL | INTRAVENOUS | Status: DC | PRN

## 2018-05-06 MED ORDER — CEFAZOLIN SODIUM-DEXTROSE 2-4 GM/100ML-% IV SOLN
INTRAVENOUS | Status: AC
  Filled 2018-05-06: qty 100

## 2018-05-06 MED ORDER — OXYCODONE HCL 5 MG PO TABS: 5.0000 mg | ORAL_TABLET | Freq: Once | ORAL | Status: DC | PRN

## 2018-05-06 MED ORDER — MEPERIDINE HCL 25 MG/ML IJ SOLN: 6.2500 mg | INTRAMUSCULAR | Status: DC | PRN

## 2018-05-06 MED ORDER — CELECOXIB 200 MG PO CAPS
200.0000 mg | ORAL_CAPSULE | ORAL | Status: AC
  Administered 2018-05-06: 13:00:00 200 mg via ORAL

## 2018-05-06 MED ORDER — SODIUM CHLORIDE 0.9 % IV SOLN: 250.0000 mL | INTRAVENOUS | Status: DC | PRN

## 2018-05-06 MED ORDER — ACETAMINOPHEN 500 MG PO TABS
1000.0000 mg | ORAL_TABLET | ORAL | Status: AC
  Administered 2018-05-06: 1000 mg via ORAL

## 2018-05-06 MED ORDER — GABAPENTIN 300 MG PO CAPS
ORAL_CAPSULE | ORAL | Status: AC
  Filled 2018-05-06: qty 1

## 2018-05-06 MED ORDER — MIDAZOLAM HCL 2 MG/2ML IJ SOLN: 1.0000 mg | INTRAMUSCULAR | Status: DC | PRN

## 2018-05-06 MED ORDER — CEFAZOLIN SODIUM-DEXTROSE 2-4 GM/100ML-% IV SOLN
2.0000 g | INTRAVENOUS | Status: AC
  Administered 2018-05-06: 14:00:00 2 g via INTRAVENOUS

## 2018-05-06 MED ORDER — BUPIVACAINE-EPINEPHRINE (PF) 0.5% -1:200000 IJ SOLN
INTRAMUSCULAR | Status: DC | PRN
  Administered 2018-05-06: 30 mL via PERINEURAL

## 2018-05-06 MED ORDER — PROPOFOL 10 MG/ML IV BOLUS
INTRAVENOUS | Status: DC | PRN
  Administered 2018-05-06: 150 mg via INTRAVENOUS

## 2018-05-06 MED ORDER — ACETAMINOPHEN 650 MG RE SUPP: 650.0000 mg | RECTAL | Status: DC | PRN

## 2018-05-06 MED ORDER — ACETAMINOPHEN 160 MG/5ML PO SOLN: 325.0000 mg | ORAL | Status: DC | PRN

## 2018-05-06 MED ORDER — DEXAMETHASONE SODIUM PHOSPHATE 4 MG/ML IJ SOLN
INTRAMUSCULAR | Status: DC | PRN
  Administered 2018-05-06: 4 mg via INTRAVENOUS

## 2018-05-06 MED ORDER — SODIUM CHLORIDE (PF) 0.9 % IJ SOLN
INTRAVENOUS | Status: DC | PRN
  Administered 2018-05-06: 15:00:00 5 mL via INTRADERMAL

## 2018-05-06 MED ORDER — CELECOXIB 200 MG PO CAPS
ORAL_CAPSULE | ORAL | Status: AC
  Filled 2018-05-06: qty 1

## 2018-05-06 MED ORDER — ACETAMINOPHEN 325 MG PO TABS: 650.0000 mg | ORAL_TABLET | ORAL | Status: DC | PRN

## 2018-05-06 MED ORDER — GABAPENTIN 300 MG PO CAPS
300.0000 mg | ORAL_CAPSULE | ORAL | Status: AC
  Administered 2018-05-06: 300 mg via ORAL

## 2018-05-06 MED ORDER — ONDANSETRON HCL 4 MG/2ML IJ SOLN: 4.0000 mg | Freq: Once | INTRAMUSCULAR | Status: DC | PRN

## 2018-05-06 SURGICAL SUPPLY — 61 items
ADH SKN CLS APL DERMABOND .7 (GAUZE/BANDAGES/DRESSINGS) ×1
APL PRP STRL LF DISP 70% ISPRP (MISCELLANEOUS) ×1
APPLIER CLIP 11 MED OPEN (CLIP) ×2
APR CLP MED 11 20 MLT OPN (CLIP) ×1
BANDAGE ACE 6X5 VEL STRL LF (GAUZE/BANDAGES/DRESSINGS) IMPLANT
BINDER BREAST LRG (GAUZE/BANDAGES/DRESSINGS) ×1 IMPLANT
BINDER BREAST MEDIUM (GAUZE/BANDAGES/DRESSINGS) IMPLANT
BINDER BREAST XLRG (GAUZE/BANDAGES/DRESSINGS) IMPLANT
BINDER BREAST XXLRG (GAUZE/BANDAGES/DRESSINGS) IMPLANT
BLADE HEX COATED 2.75 (ELECTRODE) ×2 IMPLANT
BLADE SURG 15 STRL LF DISP TIS (BLADE) ×2 IMPLANT
BLADE SURG 15 STRL SS (BLADE) ×2
CANISTER SUCT 1200ML W/VALVE (MISCELLANEOUS) ×2 IMPLANT
CHLORAPREP W/TINT 26 (MISCELLANEOUS) ×2 IMPLANT
CLIP APPLIE 11 MED OPEN (CLIP) ×1 IMPLANT
COVER BACK TABLE REUSABLE LG (DRAPES) ×2 IMPLANT
COVER MAYO STAND REUSABLE (DRAPES) ×2 IMPLANT
COVER PROBE W GEL 5X96 (DRAPES) ×2 IMPLANT
COVER WAND RF STERILE (DRAPES) IMPLANT
DECANTER SPIKE VIAL GLASS SM (MISCELLANEOUS) ×1 IMPLANT
DERMABOND ADVANCED (GAUZE/BANDAGES/DRESSINGS) ×1
DERMABOND ADVANCED .7 DNX12 (GAUZE/BANDAGES/DRESSINGS) IMPLANT
DRAPE LAPAROSCOPIC ABDOMINAL (DRAPES) ×2 IMPLANT
DRAPE UTILITY XL STRL (DRAPES) ×2 IMPLANT
DRSG PAD ABDOMINAL 8X10 ST (GAUZE/BANDAGES/DRESSINGS) ×2 IMPLANT
ELECT REM PT RETURN 9FT ADLT (ELECTROSURGICAL) ×2
ELECTRODE REM PT RTRN 9FT ADLT (ELECTROSURGICAL) ×1 IMPLANT
GAUZE SPONGE 4X4 12PLY STRL (GAUZE/BANDAGES/DRESSINGS) ×2 IMPLANT
GAUZE SPONGE 4X4 12PLY STRL LF (GAUZE/BANDAGES/DRESSINGS) ×1 IMPLANT
GLOVE EUDERMIC 7 POWDERFREE (GLOVE) ×2 IMPLANT
GOWN STRL REUS W/ TWL LRG LVL3 (GOWN DISPOSABLE) ×1 IMPLANT
GOWN STRL REUS W/ TWL XL LVL3 (GOWN DISPOSABLE) ×1 IMPLANT
GOWN STRL REUS W/TWL LRG LVL3 (GOWN DISPOSABLE) ×2
GOWN STRL REUS W/TWL XL LVL3 (GOWN DISPOSABLE) ×2
ILLUMINATOR WAVEGUIDE N/F (MISCELLANEOUS) IMPLANT
KIT MARKER MARGIN INK (KITS) ×2 IMPLANT
LIGHT WAVEGUIDE WIDE FLAT (MISCELLANEOUS) IMPLANT
NDL HYPO 25X1 1.5 SAFETY (NEEDLE) ×2 IMPLANT
NDL SAFETY ECLIPSE 18X1.5 (NEEDLE) ×1 IMPLANT
NEEDLE HYPO 18GX1.5 SHARP (NEEDLE) ×2
NEEDLE HYPO 25X1 1.5 SAFETY (NEEDLE) ×4 IMPLANT
NS IRRIG 1000ML POUR BTL (IV SOLUTION) ×2 IMPLANT
PACK BASIN DAY SURGERY FS (CUSTOM PROCEDURE TRAY) ×2 IMPLANT
PAD ALCOHOL SWAB (MISCELLANEOUS) ×2 IMPLANT
PENCIL BUTTON HOLSTER BLD 10FT (ELECTRODE) ×2 IMPLANT
SHEET MEDIUM DRAPE 40X70 STRL (DRAPES) ×2 IMPLANT
SLEEVE SCD COMPRESS KNEE MED (MISCELLANEOUS) ×2 IMPLANT
SPONGE LAP 18X18 RF (DISPOSABLE) IMPLANT
SPONGE LAP 4X18 RFD (DISPOSABLE) ×3 IMPLANT
SUT MNCRL AB 4-0 PS2 18 (SUTURE) ×4 IMPLANT
SUT SILK 2 0 SH (SUTURE) ×2 IMPLANT
SUT VIC AB 2-0 CT1 27 (SUTURE)
SUT VIC AB 2-0 CT1 TAPERPNT 27 (SUTURE) IMPLANT
SUT VIC AB 3-0 SH 27 (SUTURE)
SUT VIC AB 3-0 SH 27X BRD (SUTURE) IMPLANT
SUT VICRYL 3-0 CR8 SH (SUTURE) ×2 IMPLANT
SYR 10ML LL (SYRINGE) ×4 IMPLANT
TOWEL GREEN STERILE FF (TOWEL DISPOSABLE) ×4 IMPLANT
TRAY FAXITRON CT DISP (TRAY / TRAY PROCEDURE) ×2 IMPLANT
TUBE CONNECTING 20X1/4 (TUBING) ×2 IMPLANT
YANKAUER SUCT BULB TIP NO VENT (SUCTIONS) ×2 IMPLANT

## 2018-05-06 NOTE — Interval H&P Note (Signed)
History and Physical Interval Note:  05/06/2018 12:42 PM  Kimberly Mccormick  has presented today for surgery, with the diagnosis of LEFT BREAST CANCER.  The various methods of treatment have been discussed with the patient and family. After consideration of risks, benefits and other options for treatment, the patient has consented to  Procedure(s): LEFT BREAST LUMPECTOMY WITH RADIOACTIVE SEED AND LEFT DEEP AXILLARY SENTINEL LYMPH NODE BIOPSY AND BLUE DYE INJECTION (Left) as a surgical intervention.  The patient's history has been reviewed, patient examined, no change in status, stable for surgery.  I have reviewed the patient's chart and labs.  Questions were answered to the patient's satisfaction.     Adin Hector

## 2018-05-06 NOTE — Transfer of Care (Signed)
Immediate Anesthesia Transfer of Care Note  Patient: Kimberly Mccormick  Procedure(s) Performed: LEFT BREAST LUMPECTOMY WITH RADIOACTIVE SEED AND LEFT DEEP AXILLARY SENTINEL LYMPH NODE BIOPSY AND BLUE DYE INJECTION (Left Breast)  Patient Location: PACU  Anesthesia Type:General and Regional  Level of Consciousness: sedated  Airway & Oxygen Therapy: Patient Spontanous Breathing and Patient connected to face mask oxygen  Post-op Assessment: Report given to RN  Post vital signs: Reviewed and stable  Last Vitals:  Vitals Value Taken Time  BP 118/64 05/06/2018  4:00 PM  Temp    Pulse 61 05/06/2018  4:03 PM  Resp 11 05/06/2018  4:03 PM  SpO2 100 % 05/06/2018  4:03 PM  Vitals shown include unvalidated device data.  Last Pain:  Vitals:   05/06/18 1244  TempSrc: Oral  PainSc: 0-No pain      Patients Stated Pain Goal: 0 (66/06/30 1601)  Complications: No apparent anesthesia complications

## 2018-05-06 NOTE — Anesthesia Preprocedure Evaluation (Signed)
Anesthesia Evaluation  Patient identified by MRN, date of birth, ID band Patient awake    Reviewed: Allergy & Precautions, H&P , NPO status , Patient's Chart, lab work & pertinent test results  Airway Mallampati: I  TM Distance: >3 FB Neck ROM: Full    Dental   Pulmonary    Pulmonary exam normal        Cardiovascular hypertension, Pt. on medications Normal cardiovascular exam     Neuro/Psych    GI/Hepatic GERD  Medicated and Controlled,  Endo/Other    Renal/GU      Musculoskeletal   Abdominal   Peds  Hematology   Anesthesia Other Findings   Reproductive/Obstetrics                             Anesthesia Physical  Anesthesia Plan  ASA: III  Anesthesia Plan: General   Post-op Pain Management: GA combined w/ Regional for post-op pain   Induction: Intravenous  PONV Risk Score and Plan: 3 and Ondansetron, Treatment may vary due to age or medical condition and Midazolam  Airway Management Planned: LMA and Oral ETT  Additional Equipment:   Intra-op Plan:   Post-operative Plan: Extubation in OR  Informed Consent: I have reviewed the patients History and Physical, chart, labs and discussed the procedure including the risks, benefits and alternatives for the proposed anesthesia with the patient or authorized representative who has indicated his/her understanding and acceptance.     Dental advisory given  Plan Discussed with: CRNA, Surgeon and Anesthesiologist  Anesthesia Plan Comments: (Discussed both nerve block for pain relief post-op and GA; including NV, sore throat, dental injury, and pulmonary complications)        Anesthesia Quick Evaluation

## 2018-05-06 NOTE — Anesthesia Procedure Notes (Signed)
Procedure Name: LMA Insertion Date/Time: 05/06/2018 2:52 PM Performed by: Willa Frater, CRNA Pre-anesthesia Checklist: Patient identified, Emergency Drugs available, Suction available and Patient being monitored Patient Re-evaluated:Patient Re-evaluated prior to induction Oxygen Delivery Method: Circle system utilized Preoxygenation: Pre-oxygenation with 100% oxygen Induction Type: IV induction Ventilation: Mask ventilation without difficulty LMA: LMA inserted LMA Size: 3.0 Number of attempts: 1 Airway Equipment and Method: Bite block Placement Confirmation: positive ETCO2 Tube secured with: Tape Dental Injury: Teeth and Oropharynx as per pre-operative assessment

## 2018-05-06 NOTE — Anesthesia Procedure Notes (Signed)
Anesthesia Regional Block: Pectoralis block   Pre-Anesthetic Checklist: ,, timeout performed, Correct Patient, Correct Site, Correct Laterality, Correct Procedure, Correct Position, site marked, Risks and benefits discussed,  Surgical consent,  Pre-op evaluation,  At surgeon's request and post-op pain management  Laterality: Left  Prep: chloraprep       Needles:  Injection technique: Single-shot  Needle Type: Echogenic Stimulator Needle     Needle Length: 5cm  Needle Gauge: 22     Additional Needles:   Procedures:, nerve stimulator,,, ultrasound used (permanent image in chart),,,,   Nerve Stimulator or Paresthesia:  Response: quadraceps contraction, 0.45 mA,   Additional Responses:   Narrative:  Start time: 05/06/2018 1:57 PM End time: 05/06/2018 2:00 PM Injection made incrementally with aspirations every 5 mL.  Performed by: Personally  Anesthesiologist: Janeece Riggers, MD  Additional Notes: Functioning IV was confirmed and monitors were applied.  A 28mm 22ga Arrow echogenic stimulator needle was used. Sterile prep and drape,hand hygiene and sterile gloves were used. Ultrasound guidance: relevant anatomy identified, needle position confirmed, local anesthetic spread visualized around nerve(s)., vascular puncture avoided.  Image printed for medical record. Negative aspiration and negative test dose prior to incremental administration of local anesthetic. The patient tolerated the procedure well.

## 2018-05-06 NOTE — Progress Notes (Signed)
Emotional support during breast injections °

## 2018-05-06 NOTE — Progress Notes (Signed)
Assisted Dr. Oddono with left, ultrasound guided, pectoralis block. Side rails up, monitors on throughout procedure. See vital signs in flow sheet. Tolerated Procedure well. °

## 2018-05-06 NOTE — Discharge Instructions (Signed)
Kimberly Mccormick Office Phone Number [phone]  BREAST BIOPSY/ PARTIAL MASTECTOMY: POST OP INSTRUCTIONS  Always review your discharge instruction sheet given to you by the facility where your surgery was performed.  IF YOU HAVE DISABILITY OR FAMILY LEAVE FORMS, YOU MUST BRING THEM TO THE OFFICE FOR PROCESSING.  DO NOT GIVE THEM TO YOUR DOCTOR.  1. A prescription for pain medication may be given to you upon discharge.  Take your pain medication as prescribed, if needed.  If narcotic pain medicine is not needed, then you may take acetaminophen (Tylenol) or ibuprofen (Advil) as needed. 2. Take your usually prescribed medications unless otherwise directed 3. If you need a refill on your pain medication, please contact your pharmacy.  They will contact our office to request authorization.  Prescriptions will not be filled after 5pm or on week-ends. 4. You should eat very light the first 24 hours after surgery, such as soup, crackers, pudding, etc.  Resume your normal diet the day after surgery. 5. Most patients will experience some swelling and bruising in the breast.  Ice packs and a good support bra will help.  Swelling and bruising can take several days to resolve.  6. It is common to experience some constipation if taking pain medication after surgery.  Increasing fluid intake and taking a stool softener will usually help or prevent this problem from occurring.  A mild laxative (Milk of Magnesia or Miralax) should be taken according to package directions if there are no bowel movements after 48 hours. 7. Unless discharge instructions indicate otherwise, you may remove your bandages 24-48 hours after surgery, and you may shower at that time.  You may have steri-strips (small skin tapes) in place directly over the incision.  These strips should be left on the skin for 7-10 days.  If your surgeon used skin glue on the incision, you may shower in 24 hours.  The glue will flake off over the  next 2-3 weeks.  Any sutures or staples will be removed at the office during your follow-up visit. 8. ACTIVITIES:  You may resume regular daily activities (gradually increasing) beginning the next day.  Wearing a good support bra or sports bra minimizes pain and swelling.  You may have sexual intercourse when it is comfortable. a. You may drive when you no longer are taking prescription pain medication, you can comfortably wear a seatbelt, and you can safely maneuver your car and apply brakes. b. RETURN TO WORK:  ______________________________________________________________________________________ 9. You should see your doctor in the office for a follow-up appointment approximately two weeks after your surgery.  Your doctors nurse will typically make your follow-up appointment when she calls you with your pathology report.  Expect your pathology report 2-3 business days after your surgery.  You may call to check if you do not hear from [place] after three days. 10. OTHER INSTRUCTIONS: _______________________________________________________________________________________________ _____________________________________________________________________________________________________________________________________ _____________________________________________________________________________________________________________________________________ _____________________________________________________________________________________________________________________________________  WHEN TO CALL YOUR DOCTOR: 1. Fever over 101.0 2. Nausea and/or vomiting. 3. Extreme swelling or bruising. 4. Continued bleeding from incision. 5. Increased pain, redness, or drainage from the incision.  The clinic staff is available to answer your questions during regular business hours.  Please dont hesitate to call and ask to speak to one of the nurses for clinical concerns.  If you have a medical emergency, go to the nearest  emergency room or call 911.  A surgeon from [facility] is always on call at the hospital.  For further questions, please visit [email or web address]  Managing Your Pain After Surgery Without Opioids    Thank you for participating in our program to help patients manage their pain after surgery without opioids. This is part of our effort to provide you with the best care possible, without exposing you or your family to the risk that opioids pose.  What pain can I expect after surgery? You can expect to have some pain after surgery. This is normal. The pain is typically worse the day after surgery, and quickly begins to get better. Many studies have found that many patients are able to manage their pain after surgery with Over-the-Counter (OTC) medications such as Tylenol and Motrin. If you have a condition that does not allow you to take Tylenol or Motrin, notify your surgical team.  How will I manage my pain? The best strategy for controlling your pain after surgery is around the clock pain control with Tylenol (acetaminophen) and Motrin (ibuprofen or Advil). Alternating these medications with each other allows you to maximize your pain control. In addition to Tylenol and Motrin, you can use heating pads or ice packs on your incisions to help reduce your pain.  How will I alternate your regular strength over-the-counter pain medication? You will take a dose of pain medication every three hours. ; Start by taking 650 mg of Tylenol (2 pills of 325 mg) ; 3 hours later take 600 mg of Motrin (3 pills of 200 mg) ; 3 hours after taking the Motrin take 650 mg of Tylenol ; 3 hours after that take 600 mg of Motrin.   - 1 -  See example - if your first dose of Tylenol is at 12:00 PM   12:00 PM Tylenol 650 mg (2 pills of 325 mg)  3:00 PM Motrin 600 mg (3 pills of 200 mg)  6:00 PM Tylenol 650 mg (2 pills of 325 mg)  9:00 PM Motrin 600 mg (3 pills of  200 mg)  Continue alternating every 3 hours   We recommend that you follow this schedule around-the-clock for at least 3 days after surgery, or until you feel that it is no longer needed. Use the table on the last page of this handout to keep track of the medications you are taking. Important: Do not take more than 3033m of Tylenol or 32012mof Motrin in a 24-hour period. Do not take ibuprofen/Motrin if you have a history of bleeding stomach ulcers, severe kidney disease, &/or actively taking a blood thinner  What if I still have pain? If you have pain that is not controlled with the over-the-counter pain medications (Tylenol and Motrin or Advil) you might have what we call breakthrough pain. You will receive a prescription for a small amount of an opioid pain medication such as Oxycodone, Tramadol, or Tylenol with Codeine. Use these opioid pills in the first 24 hours after surgery if you have breakthrough pain. Do not take more than 1 pill every 4-6 hours.  If you still have uncontrolled pain after using all opioid pills, don't hesitate to call our staff using the number provided. We will help make sure you are managing your pain in the best way possible, and if necessary, we can provide a prescription for additional pain medication.   Day 1    Time  Name of Medication Number of pills taken  Amount of Acetaminophen  Pain Level   Comments  AM PM       AM PM       AM PM  AM PM       AM PM       AM PM       AM PM       AM PM       Total Daily amount of Acetaminophen Do not take more than  3,000 mg per day      Day 2    Time  Name of Medication Number of pills taken  Amount of Acetaminophen  Pain Level   Comments  AM PM       AM PM       AM PM       AM PM       AM PM       AM PM       AM PM       AM PM       Total Daily amount of Acetaminophen Do not take more than  3,000 mg per day      Day 3    Time  Name of Medication Number of pills taken  Amount of  Acetaminophen  Pain Level   Comments  AM PM       AM PM       AM PM       AM PM          AM PM       AM PM       AM PM       AM PM       Total Daily amount of Acetaminophen Do not take more than  3,000 mg per day      Day 4    Time  Name of Medication Number of pills taken  Amount of Acetaminophen  Pain Level   Comments  AM PM       AM PM       AM PM       AM PM       AM PM       AM PM       AM PM       AM PM       Total Daily amount of Acetaminophen Do not take more than  3,000 mg per day      Day 5    Time  Name of Medication Number of pills taken  Amount of Acetaminophen  Pain Level   Comments  AM PM       AM PM       AM PM       AM PM       AM PM       AM PM       AM PM       AM PM       Total Daily amount of Acetaminophen Do not take more than  3,000 mg per day       Day 6    Time  Name of Medication Number of pills taken  Amount of Acetaminophen  Pain Level  Comments  AM PM       AM PM       AM PM       AM PM       AM PM       AM PM       AM PM       AM PM       Total Daily amount of Acetaminophen Do not take more than  3,000 mg per day      Day 7    Time  Name of Medication Number of pills taken  Amount of Acetaminophen  Pain Level   Comments  AM PM       AM PM       AM PM       AM PM       AM PM       AM PM       AM PM       AM PM       Total Daily amount of Acetaminophen Do not take more than  3,000 mg per day        For additional information about how and where to safely dispose of unused opioid medications - [employer]  Disclaimer: This document contains information and/or instructional materials adapted from [place] for the typical patient with your condition. It does not replace medical advice from your health care provider because your experience may differ from that of the typical patient. Talk to your health care provider if you have any questions about  this document, your condition or your treatment plan. Adapted from [facility]  1. Numbness or the inability to move the "blocked" extremity may last from 3-48 hours after placement. The length of time depends on the medication injected and your individual response to the medication. If the numbness is not going away after 48 hours, call your surgeon.  2. The extremity that is blocked will need to be protected until the numbness is gone and the  Strength has returned. Because you cannot feel it, you will need to take extra care to avoid injury. Because it may be weak, you may have difficulty moving it or using it. You may not know what position it is in without looking at it while the block is in effect.  3. For blocks in the legs and feet, returning to weight bearing and walking needs to be done carefully. You will need to wait until the numbness is entirely gone and the strength has returned. You should be able to move your leg and foot normally before you try and bear weight or walk. You will need someone to be with you when you first try to ensure you do not fall and possibly risk injury.  4. Bruising and tenderness at the needle site are common side effects and will resolve in a few days.  5. Persistent numbness or new problems with movement should be communicated to the surgeon or the [place] [phone] for Discharge Teaching: EXPAREL (bupivacaine liposome injectable suspension)   Your surgeon or anesthesiologist gave you EXPAREL(bupivacaine) to help control your pain after surgery.   EXPAREL is a local anesthetic that provides pain relief by numbing the tissue around the surgical site.  EXPAREL is designed to release pain medication over time and can control pain for up to 72 hours.  Depending on how you respond to EXPAREL, you may require less pain medication during your recovery.  Possible side  effects:  Temporary loss of sensation or ability to move in the area where bupivacaine was injected.  Nausea, vomiting, constipation  Rarely, numbness and tingling in your mouth or lips, lightheadedness, or anxiety may occur.  Call your doctor right away if you think you may be experiencing any of these sensations, or if you have other questions regarding possible side effects.  Follow all other discharge instructions given to you  by your surgeon or nurse. Eat a healthy diet and drink plenty of water or other fluids.  If you return to the hospital for any reason within 96 hours following the administration of EXPAREL, it is important for health care providers to know that you have received this anesthetic. A teal colored band has been placed on your arm with the date, time and amount of EXPAREL you have received in order to alert and inform your health care providers. Please leave this armband in place for the full 96 hours following administration, and then you may remove the band.  May remove green armband Sunday, May 10, 2018.   Post Anesthesia Home Care Instructions  Activity: Get plenty of rest for the remainder of the day. A responsible individual must stay with you for 24 hours following the procedure.  For the next 24 hours, DO NOT: -Drive a car -Paediatric nurse -Drink alcoholic beverages -Take any medication unless instructed by your physician -Make any legal decisions or sign important papers.  Meals: Start with liquid foods such as gelatin or soup. Progress to regular foods as tolerated. Avoid greasy, spicy, heavy foods. If nausea and/or vomiting occur, drink only clear liquids until the nausea and/or vomiting subsides. Call your physician if vomiting continues.  Special Instructions/Symptoms: Your throat may feel dry or sore from the anesthesia or the breathing tube placed in your throat during surgery. If this causes discomfort, gargle with warm salt water. The  discomfort should disappear within 24 hours.

## 2018-05-06 NOTE — Op Note (Signed)
Patient Name:           Kimberly Mccormick   Date of Surgery:        05/06/2018  Pre op Diagnosis:      Invasive cancer left breast, upper inner quadrant  Post op Diagnosis:    Same  Procedure:                 Inject blue dye left breast                                      Left breast lumpectomy with radioactive seed localization                                      Reexcision superior margin                                      Left axillary deep sentinel lymph node biopsy  Surgeon:                     Edsel Petrin. Dalbert Batman, M.D., FACS  Assistant:                   Carlena Hurl, PA            Indication for Assistant: Assist with exposure, dissection, and wound closure, expedite case  Operative Indications:      This is a 68 year old female from [place]. She is here for definitive surgery for her left breast cancer. Her PCP is Florene Route. She has  seen Dr. Lindi Adie of the medical oncology group.      She gets annual mammograms and recent screening mammogram shows an 8 mm mass in the left breast, upper inner quadrant. Axillary ultrasound was negative. Image guided biopsy left breast mass shows grade 2 invasive duct carcinoma and DCIS. ER 95%. PR 0. HER-2 negative. Ki-67 20% She has seen Dr. Lindi Adie who agrees with surgical approach and plans Oncotype. She emphatically stated that she wanted her initial medical oncology and radiation oncology consults at [facility]. She is open to radiation therapy being delivered in [place]. Family history reveals half-sister on father's side had breast cancer and is a survivor. Mother had lupus. Father had heart attack. No cancer syndromes.      She is very interested in breast conservation and I think she is a good candidate for that she'll be scheduled for left breast lumpectomy with radioactive seed localization, injection blue dye, left axillary deep sentinel lymph node biopsy. Marland Kitchen  Operative Findings:       The cancer was in the upper  inner quadrant of the left breast.  I took the dissection all the way down to the pectoralis fascia as the breast was relatively thin in this area.  Therefore the broad posterior margin is the muscle.  I thought I might be close superiorly so I reexcised the superior margin.  I removed 3 or 4 sentinel nodes which appeared to be somewhat matted together.  Procedure in Detail:          Following the induction of general LMA anesthesia a surgical timeout was performed.  The patient was given IV antibiotics.  Following alcohol prep I injected 5 cc  of dilute methylene blue into the left breast, subareolar area, and massaged the breast for a few minutes.  The entire left chest wall and axilla were then prepped and draped in a sterile fashion.  0.5% Marcaine with epinephrine was injected to supplement the pectoral block.      Identified the radioactive signal in the upper inner left breast.  A curvilinear incision was made with a knife.  The lumpectomy was performed using the neoprobe and electrocautery.  The specimen was removed and marked with silk sutures and a 6 color ink kit to orient the pathologist.  The specimen mammogram looked very good.  The seed and marker clip were slightly eccentric.  I sent this to the lab where the seed was retrieved.     I reexcised the superior margin and sent that as a separate specimen.     5 metal marker clips were placed in the walls of the lumpectomy cavity and the lumpectomy wound closed with 3-0 Vicryl sutures in subcuticular 4-0 Monocryl and Dermabond    Transverse incision was made in the left axilla just below the hairline.  Using the neoprobe at the technetium setting I dissected down through the clavipectoral fascia and into the axillary space.  I found 3 or 4 sentinel nodes that were somewhat matted together and sent them as a single specimen.  After these were removed there was no more blue dye and no more radioactivity.  Hemostasis was excellent.  The wound was  irrigated.  The clavipectoral fascia was closed with interrupted 3-0 Vicryls and the skin closed with a running subcuticular 4-0 Monocryl and Dermabond.  Dry bandages and a breast binder were placed.  The patient tolerated the procedure well and was taken to PACU in stable condition.  EBL 20 cc.  Counts correct.  Complications none.    Addendum: I logged onto the PMP aware website and reviewed her prescription medication history     Scott Fix M. Dalbert Batman, M.D., FACS General and Minimally Invasive Surgery Breast and Colorectal Surgery  05/06/2018 3:36 PM

## 2018-05-07 ENCOUNTER — Encounter (HOSPITAL_BASED_OUTPATIENT_CLINIC_OR_DEPARTMENT_OTHER): Payer: Self-pay | Admitting: General Surgery

## 2018-05-07 NOTE — Anesthesia Postprocedure Evaluation (Signed)
Anesthesia Post Note  Patient: Elaynah Virginia  Procedure(s) Performed: LEFT BREAST LUMPECTOMY WITH RADIOACTIVE SEED AND LEFT DEEP AXILLARY SENTINEL LYMPH NODE BIOPSY AND BLUE DYE INJECTION (Left Breast)     Patient location during evaluation: PACU Anesthesia Type: General Level of consciousness: awake and alert Pain management: pain level controlled Vital Signs Assessment: post-procedure vital signs reviewed and stable Respiratory status: spontaneous breathing, nonlabored ventilation, respiratory function stable and patient connected to nasal cannula oxygen Cardiovascular status: blood pressure returned to baseline and stable Postop Assessment: no apparent nausea or vomiting Anesthetic complications: no    Last Vitals:  Vitals:   05/06/18 1630 05/06/18 1653  BP: (!) 146/76 (!) 159/77  Pulse: (!) 51 (!) 54  Resp: 13 14  Temp:  36.5 C  SpO2: 100% 100%    Last Pain:  Vitals:   05/06/18 1630  TempSrc:   PainSc: 0-No pain   Pain Goal: Patients Stated Pain Goal: 0 (05/06/18 1244)                 Jnyah Brazee

## 2018-05-09 NOTE — Progress Notes (Signed)
Inform patient of Pathology report,. Breast pathology shows 1.8 cm. invasive cancer, which was known pre op. Final margins are negative. All lymph nodes are negative. Good news is she will not need any further surgery. Further tests are being done on the cancer (Oncotype-DX). Be sure she has an appt. with her oncologist and an appt. to see me in 3 weeks. Let me know you reached her.  Dalbert Batman

## 2018-05-18 DIAGNOSIS — C50212 Malignant neoplasm of upper-inner quadrant of left female breast: Secondary | ICD-10-CM | POA: Diagnosis not present

## 2018-05-18 DIAGNOSIS — Z170 Estrogen receptor positive status [ER+]: Secondary | ICD-10-CM | POA: Diagnosis not present

## 2018-05-20 ENCOUNTER — Telehealth: Payer: Self-pay | Admitting: Hematology and Oncology

## 2018-05-20 ENCOUNTER — Telehealth: Payer: Self-pay | Admitting: *Deleted

## 2018-05-20 ENCOUNTER — Encounter (HOSPITAL_COMMUNITY): Payer: Self-pay | Admitting: Hematology and Oncology

## 2018-05-20 NOTE — Telephone Encounter (Signed)
Received oncotype score of 30/19%. Physician team notified. Left vm for pt to return call to discuss results. Contact information provided.

## 2018-05-20 NOTE — Telephone Encounter (Signed)
Scheduled appt per 4/29 sch message . Unable to reach patient . Left message with apt date and time

## 2018-05-20 NOTE — Progress Notes (Deleted)
 Patient Care Team: Burdine, Kimberly E, MD as PCP - General  DIAGNOSIS:    ICD-10-CM   1. Malignant neoplasm of upper-inner quadrant of left breast in female, estrogen receptor positive (HCC) C50.212    Z17.0     SUMMARY OF ONCOLOGIC HISTORY:   Malignant neoplasm of upper-inner quadrant of left breast in female, estrogen receptor positive (HCC)   03/24/2018 Initial Diagnosis    Screening detected left breast mass 8 mm upper inner quadrant biopsy revealed grade 2 IDC with DCIS ER 95%, PR 0%, Ki-67 20%, HER-2 -1+ by IHC, T1 BN 0 stage Ia clinical stage    05/06/2018 Surgery    Lumpectomy ([facility]): IDC with DCIS, 1.8cm, grade 2, ER+ (95%), PR-, HER2 negative (1+, IHC), Ki67 20%, clear margins, 4 SLN negative.     05/18/2018 Oncotype testing    Oncotype DX recurrence score 30: risk of distant recurrence at 9 years is 19%. Chemo benefit is >15%.      CHIEF COMPLIANT: Follow-up s/p lumpectomy to review pathology and discuss further treatment  INTERVAL HISTORY: Kimberly Mccormick is a 68 y.o. with above-mentioned history of left breast cancer. She underwent a lumpectomy on 05/06/18 for which pathology confirmed 1.8cm grade 2 IDC with DCIS, HER2 negative, ER 95%, PR negative, Ki67 20%, clear margins, negative lymph nodes. Oncotype testing revealed a score of 30 with a 19% chance of recurrence in the next 9 years. She presents to the clinic today to discuss the pathology report and Oncotype results and discuss further treatment.   REVIEW OF SYSTEMS:   Constitutional: Denies fevers, chills or abnormal weight loss Eyes: Denies blurriness of vision Ears, nose, mouth, throat, and face: Denies mucositis or sore throat Respiratory: Denies cough, dyspnea or wheezes Cardiovascular: Denies palpitation, chest discomfort Gastrointestinal: Denies nausea, heartburn or change in bowel habits Skin: Denies abnormal skin rashes Lymphatics: Denies new lymphadenopathy or easy bruising Neurological: Denies  numbness, tingling or new weaknesses Behavioral/Psych: Mood is stable, no new changes  Extremities: No lower extremity edema Breast: Recent left lumpectomy All other systems were reviewed with the patient and are negative.  I have reviewed the past medical history, past surgical history, social history and family history with the patient and they are unchanged from previous note.  ALLERGIES:  is allergic to eggs or egg-derived products; other; and penicillins.  MEDICATIONS:  Current Outpatient Medications  Medication Sig Dispense Refill  . acyclovir (ZOVIRAX) 400 MG tablet Take 400 mg by mouth 2 (two) times daily.     . cetirizine (ZYRTEC) 10 MG tablet Take 10 mg by mouth daily.    . Cholecalciferol (VITAMIN D) 2000 UNITS tablet Take 2,000 Units by mouth daily.    . HYDROcodone-acetaminophen (NORCO) 5-325 MG tablet Take 1-2 tablets by mouth every 6 (six) hours as needed for moderate pain or severe pain. 20 tablet 0  . metoprolol succinate (TOPROL-XL) 25 MG 24 hr tablet Take 25 mg by mouth daily.  3  . Multiple Vitamins-Minerals (MULTIVITAMIN WITH MINERALS) tablet Take 1 tablet by mouth daily.    . omeprazole (PRILOSEC) 40 MG capsule Take by mouth.    . zolpidem (AMBIEN) 10 MG tablet Take 10 mg by mouth at bedtime as needed for sleep.     No current facility-administered medications for this visit.     PHYSICAL EXAMINATION: ECOG PERFORMANCE STATUS: 1 - Symptomatic but completely ambulatory  There were no vitals filed for this visit. There were no vitals filed for this visit.  GENERAL: alert, no   distress and comfortable SKIN: skin color, texture, turgor are normal, no rashes or significant lesions EYES: normal, Conjunctiva are pink and non-injected, sclera clear OROPHARYNX: no exudate, no erythema and lips, buccal mucosa, and tongue normal  NECK: supple, thyroid normal size, non-tender, without nodularity LYMPH: no palpable lymphadenopathy in the cervical, axillary or inguinal  LUNGS: clear to auscultation and percussion with normal breathing effort HEART: regular rate & rhythm and no murmurs and no lower extremity edema ABDOMEN: abdomen soft, non-tender and normal bowel sounds MUSCULOSKELETAL: no cyanosis of digits and no clubbing  NEURO: alert & oriented x 3 with fluent speech, no focal motor/sensory deficits EXTREMITIES: No lower extremity edema  LABORATORY DATA:  I have reviewed the data as listed CMP Latest Ref Rng & Units 06/26/2017 04/15/2017 09/26/2015  Glucose 65 - 99 mg/dL 86 108(H) 102(H)  BUN 6 - 20 mg/dL _0 Creatinine 0.44 - 1.00 mg/dL 1.03(H) 2.23(H) 1.08(H)  Sodium 135 - 145 mmol/L 140 142 140  Potassium 3.5 - 5.1 mmol/L 3.7 3.6 3.5  Chloride 101 - 111 mmol/L 108 106 104  CO2 22 - 32 mmol/L _1 Calcium 8.9 - 10.3 mg/dL 8.7(L) 9.3 9.4  Total Protein 6.5 - 8.1 g/dL 6.9 - 7.7  Total Bilirubin 0.3 - 1.2 mg/dL 0.9 - 0.9  Alkaline Phos 38 - 126 U/L 94 - 90  AST 15 - 41 U/L 20 - 20  ALT 14 - 54 U/L 13(L) - 14    Lab Results  Component Value Date   WBC 7.3 06/26/2017   HGB 11.9 (L) 06/26/2017   HCT 37.1 06/26/2017   MCV 80.1 06/26/2017   PLT 236 06/26/2017   NEUTROABS 2.3 06/26/2017    ASSESSMENT & PLAN:  Malignant neoplasm of upper-inner quadrant of left breast in female, estrogen receptor positive ([place]) 05/08/2018 lumpectomy [facility]): IDC with DCIS, 1.8cm, grade 2, ER+ (95%), PR-, HER2 negative (1+, IHC), Ki67 20%, clear margins, 4 SLN negative.  Pathology counseling: I discussed the final pathology report of the patient provided  a copy of this report. I discussed the margins as well as lymph node surgeries. We also discussed the final staging along with previously performed ER/PR and HER-2/neu testing.  Oncotype DX score 30: Distant recurrence risk at 9 years 19% I discussed with her that this would be considered to be high risk and that she would need systemic chemotherapy.  Plan: 1.  Adjuvant chemotherapy with Taxotere and  Cytoxan x4 2.  Adjuvant radiation therapy 3.  Followed by adjuvant antiestrogen therapy  Plan: Port placement, chemo class, start chemotherapy in 2 to 3 weeks       No orders of the defined types were placed in this encounter.  The patient has a good understanding of the overall plan. she agrees with it. she will call with any problems that may develop before the next visit here.  Nicholas Lose, MD 05/21/2018  Julious Oka Dorshimer am acting as scribe for Dr. Nicholas Lose.  I have reviewed the above documentation for accuracy and completeness, and I agree with the above.

## 2018-05-21 ENCOUNTER — Inpatient Hospital Stay: Payer: Medicare HMO | Admitting: Hematology and Oncology

## 2018-05-21 ENCOUNTER — Telehealth: Payer: Self-pay | Admitting: *Deleted

## 2018-05-21 NOTE — Assessment & Plan Note (Deleted)
05/08/2018 lumpectomy Kimberly Mccormick): IDC with DCIS, 1.8cm, grade 2, ER+ (95%), PR-, HER2 negative (1+, IHC), Ki67 20%, clear margins, 4 SLN negative.  Pathology counseling: I discussed the final pathology report of the patient provided  a copy of this report. I discussed the margins as well as lymph node surgeries. We also discussed the final staging along with previously performed ER/PR and HER-2/neu testing.  Oncotype DX score 30: Distant recurrence risk at 9 years 19% I discussed with her that this would be considered to be high risk and that she would need systemic chemotherapy.  Plan: 1.  Adjuvant chemotherapy with Taxotere and Cytoxan x4 2.  Adjuvant radiation therapy 3.  Followed by adjuvant antiestrogen therapy  Plan: Port placement, chemo class, start chemotherapy in 2 to 3 weeks

## 2018-05-21 NOTE — Telephone Encounter (Signed)
Scheduled and confirmed appt with Kimberly Mccormick for 5/5 at 9:45 to discuss tx plan. Pt wishes to have webex meeting b/c she doesn't feel comfortable discussing her plan without a family member with her. Dr. Lindi Adie notified.

## 2018-05-25 NOTE — Progress Notes (Addendum)
HEMATOLOGY-ONCOLOGY [place] VISIT PROGRESS NOTE  I connected with Darci Needle on 05/26/2018 at  9:45 AM EDT by Webex video conference and verified that I am speaking with the correct person using two identifiers.  I discussed the limitations, risks, security and privacy concerns of performing an evaluation and management service by Webex and the availability of in person appointments.  I also discussed with the patient that there may be a patient responsible charge related to this service. The patient expressed understanding and agreed to proceed.  Patient's Location: Home Physician Location: Clinic  CHIEF COMPLIANT: Follow-up s/p lumpectomy to review pathology and discuss further treatment  INTERVAL HISTORY: Kimberly Mccormick is a 68 y.o. female with above-mentioned history of left breast cancer who underwent a left lumpectomy on 05/06/18 for which pathology confirmed grade 2 IDC with DCIS, 1.8cm, ER 95%, PR negative, HER2 negative, Ki67 20% with clear margins and 4 SLN negative. Oncotype testing revealed a score of 30 with a 19% chance of distant recurrence in 9 years. She presents today over Webex to discuss further treatment options.     Malignant neoplasm of upper-inner quadrant of left breast in female, estrogen receptor positive ([place])   03/24/2018 Initial Diagnosis    Screening detected left breast mass 8 mm upper inner quadrant biopsy revealed grade 2 IDC with DCIS ER 95%, PR 0%, Ki-67 20%, HER-2 -1+ by IHC, T1 BN 0 stage Ia clinical stage    05/06/2018 Surgery    Lumpectomy [facility]): IDC with DCIS, 1.8cm, grade 2, ER+ (95%), PR-, HER2 negative (1+, IHC), Ki67 20%, clear margins, 4 SLN negative.     05/18/2018 Oncotype testing    Oncotype DX recurrence score 30: risk of distant recurrence at 9 years is 19%. Chemo benefit is >15%.      REVIEW OF SYSTEMS:   Constitutional: Denies fevers, chills or abnormal weight loss Eyes: Denies blurriness of vision Ears, nose, mouth,  throat, and face: Denies mucositis or sore throat Respiratory: Denies cough, dyspnea or wheezes Cardiovascular: Denies palpitation, chest discomfort Gastrointestinal:  Denies nausea, heartburn or change in bowel habits Skin: Denies abnormal skin rashes Lymphatics: Denies new lymphadenopathy or easy bruising Neurological:Denies numbness, tingling or new weaknesses Behavioral/Psych: Mood is stable, no new changes  Extremities: No lower extremity edema Breast: denies any pain or lumps or nodules in either breasts All other systems were reviewed with the patient and are negative.  Observations/Objective:  There were no vitals filed for this visit. There is no height or weight on file to calculate BMI.  I have reviewed the data as listed CMP Latest Ref Rng & Units 06/26/2017 04/15/2017 09/26/2015  Glucose 65 - 99 mg/dL 86 108(H) 102(H)  BUN 6 - 20 mg/dL '18 12 14  '$ Creatinine 0.44 - 1.00 mg/dL 1.03(H) 2.23(H) 1.08(H)  Sodium 135 - 145 mmol/L 140 142 140  Potassium 3.5 - 5.1 mmol/L 3.7 3.6 3.5  Chloride 101 - 111 mmol/L 108 106 104  CO2 22 - 32 mmol/L '26 28 26  '$ Calcium 8.9 - 10.3 mg/dL 8.7(L) 9.3 9.4  Total Protein 6.5 - 8.1 g/dL 6.9 - 7.7  Total Bilirubin 0.3 - 1.2 mg/dL 0.9 - 0.9  Alkaline Phos 38 - 126 U/L 94 - 90  AST 15 - 41 U/L 20 - 20  ALT 14 - 54 U/L 13(L) - 14    Lab Results  Component Value Date   WBC 7.3 06/26/2017   HGB 11.9 (L) 06/26/2017   HCT 37.1 06/26/2017   MCV  80.1 06/26/2017   PLT 236 06/26/2017   NEUTROABS 2.3 06/26/2017      Assessment Plan:  Malignant neoplasm of upper-inner quadrant of left breast in female, estrogen receptor positive ([place]) 05/08/2018 lumpectomy [facility]): IDC with DCIS, 1.8cm, grade 2, ER+ (95%), PR-, HER2 negative (1+, IHC), Ki67 20%, clear margins, 4 SLN negative.  Pathology counseling: I discussed the final pathology report of the patient provided  a copy of this report. I discussed the margins as well as lymph node surgeries. We also  discussed the final staging along with previously performed ER/PR and HER-2/neu testing.  Oncotype DX score 30: Distant recurrence risk at 9 years 19% I discussed with her that this would be considered to be high risk and that she would need systemic chemotherapy.  Plan: 1.  Adjuvant chemotherapy with Taxotere and Cytoxan x4 2.  Adjuvant radiation therapy 3.  Followed by adjuvant antiestrogen therapy  Chemotherapy Counseling: I discussed the risks and benefits of chemotherapy including the risks of nausea/ vomiting, risk of infection from low WBC count, fatigue due to chemo or anemia, bruising or bleeding due to low platelets, mouth sores, loss/ change in taste and decreased appetite. Liver and kidney function will be monitored through out chemotherapy as abnormalities in liver and kidney function may be a side effect of treatment. Risk of permanent bone marrow dysfunction and leukemia due to chemo were also discussed.  Plan: Port placement, chemo class, start chemotherapy in 2 to 3 weeks  Patient would like to take a week to think about it and make a decision.  She will call us by 06/01/2018 with her decision.  After that we will make all arrangements.  I sent material regarding chemotherapy to the patient's son through email.  I discussed the assessment and treatment plan with the patient. The patient was provided an opportunity to ask questions and all were answered. The patient agreed with the plan and demonstrated an understanding of the instructions. The patient was advised to call back or seek an in-person evaluation if the symptoms worsen or if the condition fails to improve as anticipated.   I provided 20 minutes of face-to-face Web Ex time during this encounter.    Rulon Eisenmenger, MD 05/26/2018   I, Molly Dorshimer, am acting as scribe for Nicholas Lose, MD.  I have reviewed the above documentation for accuracy and completeness, and I agree with the above.

## 2018-05-26 ENCOUNTER — Inpatient Hospital Stay: Payer: Medicare HMO | Attending: Hematology and Oncology | Admitting: Hematology and Oncology

## 2018-05-26 DIAGNOSIS — C50212 Malignant neoplasm of upper-inner quadrant of left female breast: Secondary | ICD-10-CM

## 2018-05-26 DIAGNOSIS — Z170 Estrogen receptor positive status [ER+]: Secondary | ICD-10-CM | POA: Diagnosis not present

## 2018-05-26 NOTE — Assessment & Plan Note (Signed)
05/08/2018 lumpectomy [facility]): IDC with DCIS, 1.8cm, grade 2, ER+ (95%), PR-, HER2 negative (1+, IHC), Ki67 20%, clear margins, 4 SLN negative.  Pathology counseling: I discussed the final pathology report of the patient provided  a copy of this report. I discussed the margins as well as lymph node surgeries. We also discussed the final staging along with previously performed ER/PR and HER-2/neu testing.  Oncotype DX score 30: Distant recurrence risk at 9 years 19% I discussed with her that this would be considered to be high risk and that she would need systemic chemotherapy.  Plan: 1.  Adjuvant chemotherapy with Taxotere and Cytoxan x4 2.  Adjuvant radiation therapy 3.  Followed by adjuvant antiestrogen therapy  Chemotherapy Counseling: I discussed the risks and benefits of chemotherapy including the risks of nausea/ vomiting, risk of infection from low WBC count, fatigue due to chemo or anemia, bruising or bleeding due to low platelets, mouth sores, loss/ change in taste and decreased appetite. Liver and kidney function will be monitored through out chemotherapy as abnormalities in liver and kidney function may be a side effect of treatment. Risk of permanent bone marrow dysfunction and leukemia due to chemo were also discussed.  Plan: Port placement, chemo class, start chemotherapy in 2 to 3 weeks

## 2018-06-01 ENCOUNTER — Telehealth: Payer: Self-pay | Admitting: *Deleted

## 2018-06-01 NOTE — Telephone Encounter (Signed)
Pt return call. Discussed Dr. Geralyn Flash recommendation. Pt wishes to discuss with family again and will call back later this week.

## 2018-06-01 NOTE — Telephone Encounter (Signed)
Called pt to see if she has made a decision regarding chemo. Pt unavailable. Will call back.

## 2018-06-05 ENCOUNTER — Telehealth: Payer: Self-pay | Admitting: *Deleted

## 2018-06-05 NOTE — Telephone Encounter (Signed)
Left vm for pt to return call regarding chemo decision. Contact information provided for return call.

## 2018-06-05 NOTE — Telephone Encounter (Signed)
Spoke to pt regarding chemo decision. Pt has decided she will move forward with chemo treatments. Pt would like to wait to have 1st chemo until her granddaughter's graduation. Discussed port-a-cath, chemo class and chemo regimen. Denies further needs or questions at this time. Physician team notified.

## 2018-06-07 ENCOUNTER — Other Ambulatory Visit: Payer: Self-pay | Admitting: General Surgery

## 2018-06-08 ENCOUNTER — Other Ambulatory Visit: Payer: Self-pay | Admitting: Hematology and Oncology

## 2018-06-08 DIAGNOSIS — C50212 Malignant neoplasm of upper-inner quadrant of left female breast: Secondary | ICD-10-CM

## 2018-06-08 DIAGNOSIS — Z170 Estrogen receptor positive status [ER+]: Secondary | ICD-10-CM

## 2018-06-08 MED ORDER — DEXAMETHASONE 4 MG PO TABS: 4.0000 mg | ORAL_TABLET | Freq: Two times a day (BID) | ORAL | 0 refills | Status: DC

## 2018-06-08 MED ORDER — LIDOCAINE-PRILOCAINE 2.5-2.5 % EX CREA: TOPICAL_CREAM | CUTANEOUS | 3 refills | Status: DC

## 2018-06-08 MED ORDER — PROCHLORPERAZINE MALEATE 10 MG PO TABS: 10.0000 mg | ORAL_TABLET | Freq: Four times a day (QID) | ORAL | 1 refills | Status: DC | PRN

## 2018-06-08 MED ORDER — ONDANSETRON HCL 8 MG PO TABS: 8.0000 mg | ORAL_TABLET | Freq: Two times a day (BID) | ORAL | 1 refills | Status: DC | PRN

## 2018-06-08 NOTE — Progress Notes (Signed)
START ON PATHWAY REGIMEN - Breast     A cycle is every 21 days:     Docetaxel      Cyclophosphamide   **Always confirm dose/schedule in your pharmacy ordering system**  Patient Characteristics: Postoperative without Neoadjuvant Therapy (Pathologic Staging), Invasive Disease, Adjuvant Therapy, HER2 Negative/Unknown/Equivocal, ER Positive, Node Negative, pT1a-c, pN0/N16m or pT2 or Higher, pN0, Oncotype High Risk (? 26) Therapeutic Status: Postoperative without Neoadjuvant Therapy (Pathologic Staging) AJCC Grade: G2 AJCC N Category: pN0 AJCC M Category: cM0 ER Status: Positive (+) AJCC 8 Stage Grouping: IA HER2 Status: Negative (-) Oncotype Dx Recurrence Score: 30 AJCC T Category: pT1c PR Status: Negative (-) Has this patient completed genomic testing<= Yes - Oncotype DX(R) Intent of Therapy: Curative Intent, Discussed with Patient

## 2018-06-09 ENCOUNTER — Telehealth: Payer: Self-pay | Admitting: Hematology and Oncology

## 2018-06-09 ENCOUNTER — Encounter (HOSPITAL_BASED_OUTPATIENT_CLINIC_OR_DEPARTMENT_OTHER): Payer: Self-pay | Admitting: *Deleted

## 2018-06-09 ENCOUNTER — Other Ambulatory Visit: Payer: Self-pay

## 2018-06-09 NOTE — Telephone Encounter (Signed)
Spoke with patient scheduling chemo ed class. Per patient she needs to check with her son re when she can come in. Patient asked to be called back tomorrow.

## 2018-06-10 NOTE — Telephone Encounter (Signed)
Per 5/15 schedule message patient needs to be scheduled for patient education. Spoke with patient yesterday and per patient she needs to speak with son re when she can come in. Patient asked to be called back.   Called patient today and was not able to reach her. Left message re patient calling office to schedule this appointment. Message routed to provider/navigator.

## 2018-06-10 NOTE — Telephone Encounter (Signed)
Patient returned call and will do patient education via telephone tomorrow 5/21 @ 10 am.

## 2018-06-11 ENCOUNTER — Telehealth: Payer: Self-pay | Admitting: *Deleted

## 2018-06-11 ENCOUNTER — Inpatient Hospital Stay: Payer: Medicare HMO

## 2018-06-12 ENCOUNTER — Other Ambulatory Visit (HOSPITAL_COMMUNITY): Payer: Medicare HMO | Attending: General Surgery

## 2018-06-15 NOTE — H&P (Signed)
Kimberly Mccormick Location: [facility] Patient #: [id number] DOB: [birth year] Single / Language: Kimberly Mccormick / Race: Black or African American Female        History of Present Illness      This is a 68 year old female who returns for a postop visit following definitive surgery for her left breast cancer, and for a preoperative conference for Port-A-Cath. Dr. Lindi Mccormick is her oncologist. Her PCP is Kimberly Mccormick.     On May 06, 2018 she underwent left breast lumpectomy and sentinel node biopsy. Final pathology showed a 1.8 cm invasive ductal carcinoma in the superior left breast. Final margins negative. All 4 sentinel nodes negative. ER 95%. PVR 0. HER-2 negative. Ki-67. Stage TIc, N0. Oncotype score 30/19% risk. She has had several conversations with Dr. Lindi Mccormick and is decided to receive adjuvant chemotherapy She has no complaints about her breast or her axilla or her shoulder.      Port-A-Cath insertion is scheduled for May 26 Anticipate left whole breast radiation therapy after she completes chemotherapy      I will see her back in the office in 6 months, sooner if needed Remove Port-A-Cath down the road  when okay with Dr. Lindi Mccormick   Allergies Penicillins  Allergies Reconciled   Medication History  Omeprazole ('40MG'$  Capsule DR, Oral) Active. Acyclovir ('400MG'$  Tablet, Oral) Active. Metoprolol Succinate ER ('25MG'$  Tablet ER 24HR, Oral) Active. Cetirizine HCl ('10MG'$  Tablet, Oral) Active. Zolpidem Tartrate ('10MG'$  Tablet, Oral) Active. Vitamin D3 (2000UNIT Tablet, Oral) Active. B Complete (Oral) Active. Medications Reconciled  Vitals  Weight: 134.8 lb Height: 63in Body Surface Area: 1.64 m Body Mass Index: 23.88 kg/m  Temp.: 38F(Temporal)  Pulse: 67 (Regular)  BP: 110/72 (Sitting, Left Arm, Standard)     Physical Exam  General Note: Pleasant. Alert. No distress.  Chest and Lung Exam Note: Lungs clear to  auscultation  Breast Note: Examined sitting. Breasts are symmetrical. Nipple alignment excellent. Contour excellent. Transverse lumpectomy incision at 12:00 soft. No hematoma or infection. Left axillary incision soft. No seroma. Left shoulder range of motion 100%. No arm swelling. No arm sensory deficit   Cardiovascular Note: Regular rate and rhythm. No ectopy or murmur. Radial and femoral pulses palpable   Neuropsychiatric Note: Alert and oriented 4. No motor or sensory deficits grossly. Ambulating independently. Good insight and decision-making   Lymphatic Note: No adenopathy in the neck or axilla     Assessment & Plan PRIMARY CANCER OF UPPER INNER QUADRANT OF LEFT FEMALE BREAST (C50.212)  You are recovering from your left lumpectomy and sentinel node biopsy without any obvious surgical complications All of the wounds look great You have discussed the pathology report with Dr. Lindi Mccormick and with me. I gave you a copy  Because you're oncotype score is elevated, you are at increased risk that the cancer could come back You have decided to go ahead with chemotherapy  You are scheduled for Port-A-Cath insertion by me on May 26 I discussed the indications, techniques, and risk of that surgery in detail with you Later on you will need radiation therapy Eventually, we can remove the Port-A-Cath  Be sure to exercise daily  I will see you back in the office in 6 months to see how you're doing  FAMILY HISTORY OF BREAST CANCER IN SISTER (Z80.3) HISTORY OF CHOLECYSTECTOMY (Z90.49) CHRONIC GERD (K21.9)   Kimberly Mccormick M. Dalbert Batman, M.D., [facility] General and Minimally invasive Surgery Breast and Colorectal Surgery Office:   [phone] Pager:  [phone]  

## 2018-06-16 ENCOUNTER — Ambulatory Visit (HOSPITAL_BASED_OUTPATIENT_CLINIC_OR_DEPARTMENT_OTHER): Payer: Medicare HMO | Admitting: Certified Registered"

## 2018-06-16 ENCOUNTER — Other Ambulatory Visit: Payer: Self-pay

## 2018-06-16 ENCOUNTER — Ambulatory Visit (HOSPITAL_COMMUNITY): Payer: Medicare HMO

## 2018-06-16 ENCOUNTER — Encounter (HOSPITAL_BASED_OUTPATIENT_CLINIC_OR_DEPARTMENT_OTHER)
Admission: RE | Disposition: A | Discharge status: Home / Self Care | Payer: Self-pay | Source: Home / Self Care | Attending: General Surgery

## 2018-06-16 ENCOUNTER — Ambulatory Visit (HOSPITAL_BASED_OUTPATIENT_CLINIC_OR_DEPARTMENT_OTHER)
Admission: RE | Admit: 2018-06-16 | Discharge: 2018-06-16 | Disposition: A | Discharge status: Home / Self Care | Payer: Medicare HMO | Attending: General Surgery | Admitting: General Surgery

## 2018-06-16 ENCOUNTER — Encounter (HOSPITAL_BASED_OUTPATIENT_CLINIC_OR_DEPARTMENT_OTHER): Payer: Self-pay | Admitting: *Deleted

## 2018-06-16 DIAGNOSIS — Z170 Estrogen receptor positive status [ER+]: Secondary | ICD-10-CM | POA: Insufficient documentation

## 2018-06-16 DIAGNOSIS — Z1159 Encounter for screening for other viral diseases: Secondary | ICD-10-CM | POA: Insufficient documentation

## 2018-06-16 DIAGNOSIS — K219 Gastro-esophageal reflux disease without esophagitis: Secondary | ICD-10-CM | POA: Insufficient documentation

## 2018-06-16 DIAGNOSIS — C50212 Malignant neoplasm of upper-inner quadrant of left female breast: Secondary | ICD-10-CM | POA: Diagnosis not present

## 2018-06-16 DIAGNOSIS — Z79899 Other long term (current) drug therapy: Secondary | ICD-10-CM | POA: Diagnosis not present

## 2018-06-16 DIAGNOSIS — I10 Essential (primary) hypertension: Secondary | ICD-10-CM | POA: Diagnosis not present

## 2018-06-16 DIAGNOSIS — Z452 Encounter for adjustment and management of vascular access device: Secondary | ICD-10-CM | POA: Diagnosis not present

## 2018-06-16 DIAGNOSIS — Z803 Family history of malignant neoplasm of breast: Secondary | ICD-10-CM | POA: Insufficient documentation

## 2018-06-16 DIAGNOSIS — E7800 Pure hypercholesterolemia, unspecified: Secondary | ICD-10-CM | POA: Diagnosis not present

## 2018-06-16 DIAGNOSIS — Z95828 Presence of other vascular implants and grafts: Secondary | ICD-10-CM

## 2018-06-16 HISTORY — PX: PORTACATH PLACEMENT: SHX2246

## 2018-06-16 LAB — SARS CORONAVIRUS 2 BY RT PCR (HOSPITAL ORDER, PERFORMED IN ~~LOC~~ HOSPITAL LAB): SARS Coronavirus 2: NEGATIVE

## 2018-06-16 IMAGING — CR PORTABLE CHEST - 1 VIEW
1 series · 1 of 1 positions shown · non-contrast
Comparison: [DATE]

CLINICAL DATA: Port-A-Cath placement.

EXAM:
PORTABLE CHEST 1 VIEW

[chest ap]
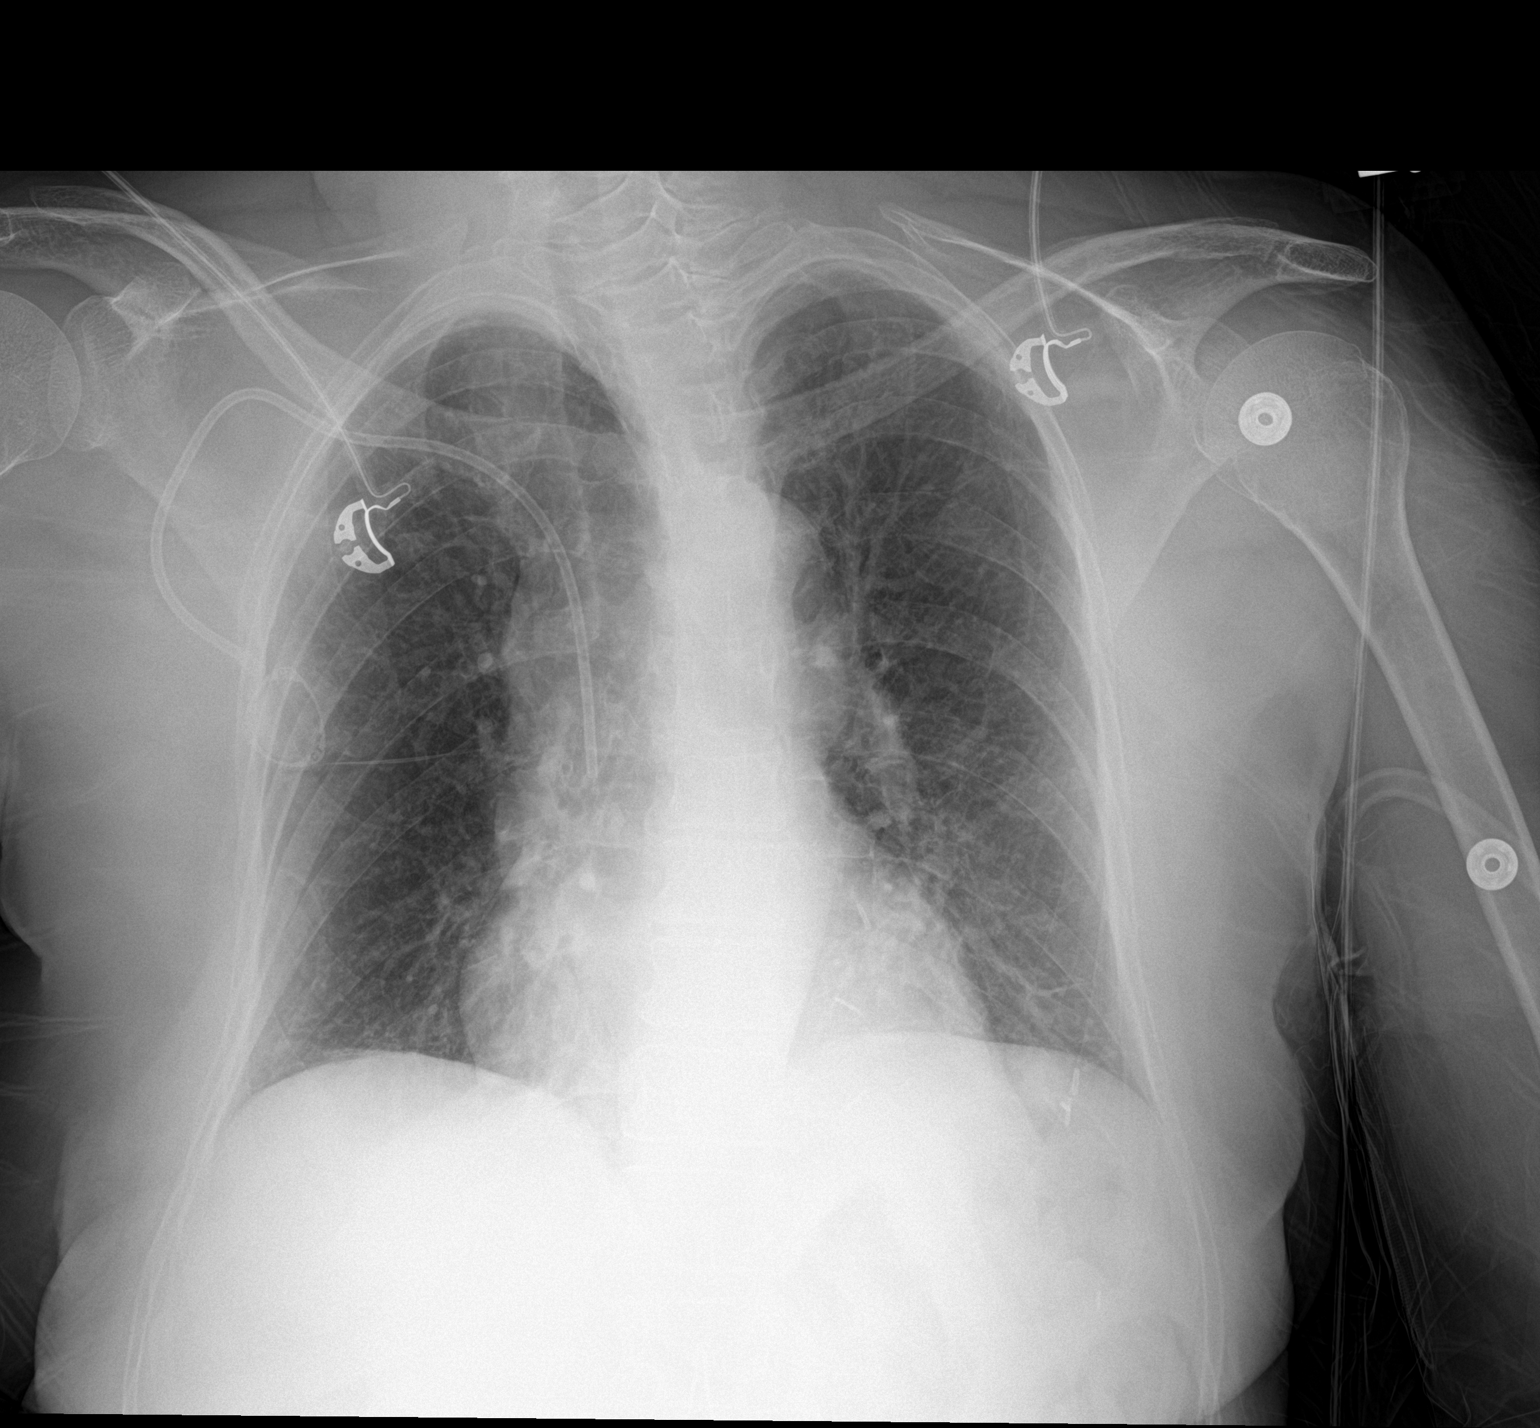

[1 of 1 positions shown; findings below may reference images not displayed]

FINDINGS: Power port placed on the right from a subclavian approach. Tip is in
the SVC 2 cm above the right atrium. No pneumothorax. Lungs remain
clear.
IMPRESSION: Good appearance following Port-A-Cath placement. Tip in the SVC
above the right atrium. No complication evident.

## 2018-06-16 SURGERY — INSERTION, TUNNELED CENTRAL VENOUS DEVICE, WITH PORT
Anesthesia: General | Site: Chest | Laterality: Right

## 2018-06-16 MED ORDER — SODIUM CHLORIDE 0.9% FLUSH: 3.0000 mL | Freq: Two times a day (BID) | INTRAVENOUS | Status: DC

## 2018-06-16 MED ORDER — MIDAZOLAM HCL 2 MG/2ML IJ SOLN
INTRAMUSCULAR | Status: AC
  Filled 2018-06-16: qty 2

## 2018-06-16 MED ORDER — MEPERIDINE HCL 25 MG/ML IJ SOLN: 6.2500 mg | INTRAMUSCULAR | Status: DC | PRN

## 2018-06-16 MED ORDER — LIDOCAINE 2% (20 MG/ML) 5 ML SYRINGE
INTRAMUSCULAR | Status: AC
  Filled 2018-06-16: qty 5

## 2018-06-16 MED ORDER — FENTANYL CITRATE (PF) 100 MCG/2ML IJ SOLN: 25.0000 ug | INTRAMUSCULAR | Status: DC | PRN

## 2018-06-16 MED ORDER — HYDROCODONE-ACETAMINOPHEN 5-325 MG PO TABS: 1.0000 | ORAL_TABLET | Freq: Four times a day (QID) | ORAL | 0 refills | Status: DC | PRN

## 2018-06-16 MED ORDER — PROPOFOL 10 MG/ML IV BOLUS
INTRAVENOUS | Status: DC | PRN
  Administered 2018-06-16: 130 mg via INTRAVENOUS

## 2018-06-16 MED ORDER — PROPOFOL 10 MG/ML IV BOLUS
INTRAVENOUS | Status: AC
  Filled 2018-06-16: qty 40

## 2018-06-16 MED ORDER — HYDROMORPHONE HCL 1 MG/ML IJ SOLN: 0.2500 mg | INTRAMUSCULAR | Status: DC | PRN

## 2018-06-16 MED ORDER — SODIUM CHLORIDE 0.9 % IV SOLN: 250.0000 mL | INTRAVENOUS | Status: DC | PRN

## 2018-06-16 MED ORDER — CEFAZOLIN SODIUM-DEXTROSE 2-4 GM/100ML-% IV SOLN
2.0000 g | INTRAVENOUS | Status: AC
  Administered 2018-06-16: 2 g via INTRAVENOUS

## 2018-06-16 MED ORDER — FENTANYL CITRATE (PF) 100 MCG/2ML IJ SOLN
50.0000 ug | INTRAMUSCULAR | Status: DC | PRN
  Administered 2018-06-16: 50 ug via INTRAVENOUS

## 2018-06-16 MED ORDER — ONDANSETRON HCL 4 MG/2ML IJ SOLN
INTRAMUSCULAR | Status: DC | PRN
  Administered 2018-06-16: 4 mg via INTRAVENOUS

## 2018-06-16 MED ORDER — CHLORHEXIDINE GLUCONATE CLOTH 2 % EX PADS: 6.0000 | MEDICATED_PAD | Freq: Once | CUTANEOUS | Status: DC

## 2018-06-16 MED ORDER — PROMETHAZINE HCL 25 MG/ML IJ SOLN: 6.2500 mg | INTRAMUSCULAR | Status: DC | PRN

## 2018-06-16 MED ORDER — ACETAMINOPHEN 500 MG PO TABS
ORAL_TABLET | ORAL | Status: AC
  Filled 2018-06-16: qty 2

## 2018-06-16 MED ORDER — SCOPOLAMINE 1 MG/3DAYS TD PT72: 1.0000 | MEDICATED_PATCH | Freq: Once | TRANSDERMAL | Status: DC | PRN

## 2018-06-16 MED ORDER — OXYCODONE HCL 5 MG PO TABS: 5.0000 mg | ORAL_TABLET | Freq: Once | ORAL | Status: DC | PRN

## 2018-06-16 MED ORDER — FENTANYL CITRATE (PF) 100 MCG/2ML IJ SOLN
INTRAMUSCULAR | Status: AC
  Filled 2018-06-16: qty 2

## 2018-06-16 MED ORDER — ACETAMINOPHEN 325 MG PO TABS: 650.0000 mg | ORAL_TABLET | ORAL | Status: DC | PRN

## 2018-06-16 MED ORDER — SODIUM CHLORIDE 0.9% FLUSH: 3.0000 mL | INTRAVENOUS | Status: DC | PRN

## 2018-06-16 MED ORDER — LACTATED RINGERS IV SOLN: INTRAVENOUS | Status: DC

## 2018-06-16 MED ORDER — CEFAZOLIN SODIUM-DEXTROSE 2-4 GM/100ML-% IV SOLN
INTRAVENOUS | Status: AC
  Filled 2018-06-16: qty 100

## 2018-06-16 MED ORDER — HEPARIN SOD (PORK) LOCK FLUSH 100 UNIT/ML IV SOLN
INTRAVENOUS | Status: DC | PRN
  Administered 2018-06-16: 500 [IU] via INTRAVENOUS

## 2018-06-16 MED ORDER — PROPOFOL 10 MG/ML IV BOLUS
INTRAVENOUS | Status: AC
  Filled 2018-06-16: qty 20

## 2018-06-16 MED ORDER — HEPARIN (PORCINE) IN NACL 2-0.9 UNITS/ML
INTRAMUSCULAR | Status: AC | PRN
  Administered 2018-06-16: 1

## 2018-06-16 MED ORDER — GABAPENTIN 300 MG PO CAPS
300.0000 mg | ORAL_CAPSULE | ORAL | Status: AC
  Administered 2018-06-16: 12:00:00 300 mg via ORAL

## 2018-06-16 MED ORDER — DEXAMETHASONE SODIUM PHOSPHATE 4 MG/ML IJ SOLN
INTRAMUSCULAR | Status: DC | PRN
  Administered 2018-06-16: 10 mg via INTRAVENOUS

## 2018-06-16 MED ORDER — MIDAZOLAM HCL 2 MG/2ML IJ SOLN: 1.0000 mg | INTRAMUSCULAR | Status: DC | PRN

## 2018-06-16 MED ORDER — DEXAMETHASONE SODIUM PHOSPHATE 10 MG/ML IJ SOLN
INTRAMUSCULAR | Status: AC
  Filled 2018-06-16: qty 1

## 2018-06-16 MED ORDER — GABAPENTIN 300 MG PO CAPS
ORAL_CAPSULE | ORAL | Status: AC
  Filled 2018-06-16: qty 1

## 2018-06-16 MED ORDER — ACETAMINOPHEN 650 MG RE SUPP: 650.0000 mg | RECTAL | Status: DC | PRN

## 2018-06-16 MED ORDER — OXYCODONE HCL 5 MG/5ML PO SOLN: 5.0000 mg | Freq: Once | ORAL | Status: DC | PRN

## 2018-06-16 MED ORDER — OXYCODONE HCL 5 MG PO TABS: 5.0000 mg | ORAL_TABLET | ORAL | Status: DC | PRN

## 2018-06-16 MED ORDER — ACETAMINOPHEN 500 MG PO TABS
1000.0000 mg | ORAL_TABLET | ORAL | Status: AC
  Administered 2018-06-16: 12:00:00 1000 mg via ORAL

## 2018-06-16 MED ORDER — LIDOCAINE HCL (CARDIAC) PF 100 MG/5ML IV SOSY
PREFILLED_SYRINGE | INTRAVENOUS | Status: DC | PRN
  Administered 2018-06-16: 60 mg via INTRAVENOUS

## 2018-06-16 MED ORDER — ONDANSETRON HCL 4 MG/2ML IJ SOLN
INTRAMUSCULAR | Status: AC
  Filled 2018-06-16: qty 2

## 2018-06-16 MED ORDER — BUPIVACAINE-EPINEPHRINE (PF) 0.5% -1:200000 IJ SOLN
INTRAMUSCULAR | Status: DC | PRN
  Administered 2018-06-16: 14 mL

## 2018-06-16 MED ORDER — LACTATED RINGERS IV SOLN
INTRAVENOUS | Status: DC
  Administered 2018-06-16 (×2): via INTRAVENOUS

## 2018-06-16 SURGICAL SUPPLY — 58 items
ADH SKN CLS APL DERMABOND .7 (GAUZE/BANDAGES/DRESSINGS) ×1
APL PRP STRL LF DISP 70% ISPRP (MISCELLANEOUS) ×1
APL SKNCLS STERI-STRIP NONHPOA (GAUZE/BANDAGES/DRESSINGS)
BAG DECANTER FOR FLEXI CONT (MISCELLANEOUS) ×3 IMPLANT
BENZOIN TINCTURE PRP APPL 2/3 (GAUZE/BANDAGES/DRESSINGS) IMPLANT
BLADE HEX COATED 2.75 (ELECTRODE) ×3 IMPLANT
BLADE SURG 15 STRL LF DISP TIS (BLADE) ×1 IMPLANT
BLADE SURG 15 STRL SS (BLADE) ×3
CANISTER SUCT 1200ML W/VALVE (MISCELLANEOUS) IMPLANT
CATH ROBINSON RED A/P 8FR (CATHETERS) ×2 IMPLANT
CHLORAPREP W/TINT 26 (MISCELLANEOUS) ×3 IMPLANT
CLOSURE WOUND 1/2 X4 (GAUZE/BANDAGES/DRESSINGS)
COVER BACK TABLE REUSABLE LG (DRAPES) ×3 IMPLANT
COVER MAYO STAND REUSABLE (DRAPES) ×3 IMPLANT
COVER PROBE 5X48 (MISCELLANEOUS)
COVER WAND RF STERILE (DRAPES) IMPLANT
DECANTER SPIKE VIAL GLASS SM (MISCELLANEOUS) IMPLANT
DERMABOND ADVANCED (GAUZE/BANDAGES/DRESSINGS) ×2
DERMABOND ADVANCED .7 DNX12 (GAUZE/BANDAGES/DRESSINGS) ×1 IMPLANT
DRAPE C-ARM 42X72 X-RAY (DRAPES) ×3 IMPLANT
DRAPE IMP U-DRAPE 54X76 (DRAPES) ×2 IMPLANT
DRAPE LAPAROSCOPIC ABDOMINAL (DRAPES) ×3 IMPLANT
DRAPE UTILITY XL STRL (DRAPES) ×3 IMPLANT
DRSG TEGADERM 2-3/8X2-3/4 SM (GAUZE/BANDAGES/DRESSINGS) IMPLANT
DRSG TEGADERM 4X10 (GAUZE/BANDAGES/DRESSINGS) IMPLANT
DRSG TEGADERM 4X4.75 (GAUZE/BANDAGES/DRESSINGS) IMPLANT
ELECT REM PT RETURN 9FT ADLT (ELECTROSURGICAL) ×3
ELECTRODE REM PT RTRN 9FT ADLT (ELECTROSURGICAL) ×1 IMPLANT
GAUZE SPONGE 4X4 12PLY STRL LF (GAUZE/BANDAGES/DRESSINGS) IMPLANT
GLOVE EUDERMIC 7 POWDERFREE (GLOVE) ×3 IMPLANT
GOWN STRL REUS W/ TWL LRG LVL3 (GOWN DISPOSABLE) ×1 IMPLANT
GOWN STRL REUS W/ TWL XL LVL3 (GOWN DISPOSABLE) ×1 IMPLANT
GOWN STRL REUS W/TWL LRG LVL3 (GOWN DISPOSABLE) ×3
GOWN STRL REUS W/TWL XL LVL3 (GOWN DISPOSABLE) ×3
IV CATH PLACEMENT UNIT 16 GA (IV SOLUTION) IMPLANT
IV KIT MINILOC 20X1 SAFETY (NEEDLE) IMPLANT
KIT CVR 48X5XPRB PLUP LF (MISCELLANEOUS) ×1 IMPLANT
KIT PORT POWER 8FR ISP CVUE (Port) ×3 IMPLANT
NDL BLUNT 17GA (NEEDLE) IMPLANT
NDL HYPO 25X1 1.5 SAFETY (NEEDLE) ×1 IMPLANT
NEEDLE BLUNT 17GA (NEEDLE) IMPLANT
NEEDLE HYPO 22GX1.5 SAFETY (NEEDLE) ×3 IMPLANT
NEEDLE HYPO 25X1 1.5 SAFETY (NEEDLE) ×3 IMPLANT
PACK BASIN DAY SURGERY FS (CUSTOM PROCEDURE TRAY) ×3 IMPLANT
PENCIL BUTTON HOLSTER BLD 10FT (ELECTRODE) ×3 IMPLANT
SET SHEATH INTRODUCER 10FR (MISCELLANEOUS) IMPLANT
SHEATH COOK PEEL AWAY SET 9F (SHEATH) IMPLANT
SLEEVE SCD COMPRESS KNEE MED (MISCELLANEOUS) ×3 IMPLANT
STRIP CLOSURE SKIN 1/2X4 (GAUZE/BANDAGES/DRESSINGS) IMPLANT
SUT MNCRL AB 4-0 PS2 18 (SUTURE) ×3 IMPLANT
SUT PROLENE 2 0 CT2 30 (SUTURE) ×3 IMPLANT
SUT VICRYL 3-0 CR8 SH (SUTURE) ×3 IMPLANT
SYR 10ML LL (SYRINGE) ×3 IMPLANT
SYR 5ML LUER SLIP (SYRINGE) ×3 IMPLANT
TOWEL GREEN STERILE FF (TOWEL DISPOSABLE) ×8 IMPLANT
TUBE CONNECTING 20'X1/4 (TUBING)
TUBE CONNECTING 20X1/4 (TUBING) IMPLANT
YANKAUER SUCT BULB TIP NO VENT (SUCTIONS) IMPLANT

## 2018-06-16 NOTE — Interval H&P Note (Signed)
History and Physical Interval Note:  06/16/2018 9:00 AM  Kimberly Mccormick  has presented today for surgery, with the diagnosis of LEFT BREAST CANCER.  The various methods of treatment have been discussed with the patient and family. After consideration of risks, benefits and other options for treatment, the patient has consented to  Procedure(s): INSERTION PORT-A-CATH WITH ULTRASOUND (N/A) as a surgical intervention.  The patient's history has been reviewed, patient examined, no change in status, stable for surgery.  I have reviewed the patient's chart and labs.  Questions were answered to the patient's satisfaction.     Adin Hector

## 2018-06-16 NOTE — Anesthesia Preprocedure Evaluation (Signed)
Anesthesia Evaluation  Patient identified by MRN, date of birth, ID band Patient awake    Reviewed: Allergy & Precautions, H&P , NPO status , Patient's Chart, lab work & pertinent test results  Airway Mallampati: I  TM Distance: >3 FB Neck ROM: Full    Dental no notable dental hx.    Pulmonary    Pulmonary exam normal breath sounds clear to auscultation       Cardiovascular hypertension, Pt. on medications Normal cardiovascular exam Rhythm:Regular Rate:Normal     Neuro/Psych    GI/Hepatic GERD  Medicated and Controlled,  Endo/Other    Renal/GU      Musculoskeletal   Abdominal   Peds  Hematology   Anesthesia Other Findings   Reproductive/Obstetrics                             Anesthesia Physical  Anesthesia Plan  ASA: III  Anesthesia Plan: General   Post-op Pain Management: GA combined w/ Regional for post-op pain   Induction: Intravenous  PONV Risk Score and Plan: 3 and Ondansetron, Treatment may vary due to age or medical condition, Midazolam and Dexamethasone  Airway Management Planned: LMA  Additional Equipment:   Intra-op Plan:   Post-operative Plan: Extubation in OR  Informed Consent: I have reviewed the patients History and Physical, chart, labs and discussed the procedure including the risks, benefits and alternatives for the proposed anesthesia with the patient or authorized representative who has indicated his/her understanding and acceptance.     Dental advisory given  Plan Discussed with: CRNA, Surgeon and Anesthesiologist  Anesthesia Plan Comments:         Anesthesia Quick Evaluation

## 2018-06-16 NOTE — Transfer of Care (Signed)
Immediate Anesthesia Transfer of Care Note  Patient: Kimberly Mccormick  Procedure(s) Performed: INSERTION PORT-A-CATH WITH ULTRASOUND (N/A )  Patient Location: PACU  Anesthesia Type:General  Level of Consciousness: awake, oriented and sedated  Airway & Oxygen Therapy: Patient Spontanous Breathing and Patient connected to nasal cannula oxygen  Post-op Assessment: Report given to RN and Post -op Vital signs reviewed and stable  Post vital signs: Reviewed and stable  Last Vitals:  Vitals Value Taken Time  BP    Temp    Pulse 57 06/16/2018  2:50 PM  Resp 15 06/16/2018  2:50 PM  SpO2 100 % 06/16/2018  2:50 PM  Vitals shown include unvalidated device data.  Last Pain:  Vitals:   06/16/18 1140  TempSrc: Oral  PainSc: 3       Patients Stated Pain Goal: 1 (46/21/94 7125)  Complications: No apparent anesthesia complications

## 2018-06-16 NOTE — Anesthesia Postprocedure Evaluation (Signed)
Anesthesia Post Note  Patient: Kimberly Mccormick  Procedure(s) Performed: INSERTION PORT-A-CATH (Right Chest)     Patient location during evaluation: PACU Anesthesia Type: General Level of consciousness: awake and alert Pain management: pain level controlled Vital Signs Assessment: post-procedure vital signs reviewed and stable Respiratory status: spontaneous breathing, nonlabored ventilation and respiratory function stable Cardiovascular status: blood pressure returned to baseline and stable Postop Assessment: no apparent nausea or vomiting Anesthetic complications: no    Last Vitals:  Vitals:   06/16/18 1530 06/16/18 1553  BP:  (!) 176/93  Pulse: 60   Resp: 16   Temp: 36.4 C   SpO2: 100% 100%    Last Pain:  Vitals:   06/16/18 1530  TempSrc:   PainSc: [place]

## 2018-06-16 NOTE — Discharge Instructions (Signed)
PORT-A-CATH: POST OP INSTRUCTIONS  Always review your discharge instruction sheet given to you by the facility where your surgery was performed.   1. A prescription for pain medication may be given to you upon discharge. Take your pain medication as prescribed, if needed. If narcotic pain medicine is not needed, then you make take acetaminophen (Tylenol) or ibuprofen (Advil) as needed.  2. Take your usually prescribed medications unless otherwise directed. 3. If you need a refill on your pain medication, please contact our office. All narcotic pain medicine now requires a paper prescription.  Phoned in and fax refills are no longer allowed by law.  Prescriptions will not be filled after 5 pm or on weekends.  4. You should follow a light diet for the remainder of the day after your procedure. 5. Most patients will experience some mild swelling and/or bruising in the area of the incision. It may take several days to resolve. 6. It is common to experience some constipation if taking pain medication after surgery. Increasing fluid intake and taking a stool softener (such as Colace) will usually help or prevent this problem from occurring. A mild laxative (Milk of Magnesia or Miralax) should be taken according to package directions if there are no bowel movements after 48 hours.  7. Unless discharge instructions indicate otherwise, you may remove your bandages 48 hours after surgery, and you may shower at that time. You may have steri-strips (small white skin tapes) in place directly over the incision.  These strips should be left on the skin for 7-10 days.  If your surgeon used Dermabond (skin glue) on the incision, you may shower in 24 hours.  The glue will flake off over the next 2-3 weeks.  8. If your port is left accessed at the end of surgery (needle left in port), the dressing cannot get wet and should only by changed by a healthcare professional. When the port is no longer accessed (when the  needle has been removed), follow step 7.   9. ACTIVITIES:  Limit activity involving your arms for the next 72 hours. Do no strenuous exercise or activity for 1 week. You may drive when you are no longer taking prescription pain medication, you can comfortably wear a seatbelt, and you can maneuver your car. 10.You may need to see your doctor in the office for a follow-up appointment.  Please       check with your doctor.  11.When you receive a new Port-a-Cath, you will get a product guide and        ID card.  Please keep them in case you need them.  WHEN TO CALL YOUR DOCTOR [phone]): 1. Fever over 101.0 2. Chills 3. Continued bleeding from incision 4. Increased redness and tenderness at the site 5. Shortness of breath, difficulty breathing   The clinic staff is available to answer your questions during regular business hours. Please dont hesitate to call and ask to speak to one of the nurses or medical assistants for clinical concerns. If you have a medical emergency, go to the nearest emergency room or call 911.  A surgeon from [facility] is always on call at the hospital.     For further information, please visit [email or web address]              Managing Your Pain After Surgery Without Opioids    Thank you for participating in our program to help patients manage their pain after surgery without opioids. This is part  of our effort to provide you with the best care possible, without exposing you or your family to the risk that opioids pose.  What pain can I expect after surgery? You can expect to have some pain after surgery. This is normal. The pain is typically worse the day after surgery, and quickly begins to get better. Many studies have found that many patients are able to manage their pain after surgery with Over-the-Counter (OTC) medications such as Tylenol and Motrin. If you have a condition that does not allow you to take  Tylenol or Motrin, notify your surgical team.  How will I manage my pain? The best strategy for controlling your pain after surgery is around the clock pain control with Tylenol (acetaminophen) and Motrin (ibuprofen or Advil). Alternating these medications with each other allows you to maximize your pain control. In addition to Tylenol and Motrin, you can use heating pads or ice packs on your incisions to help reduce your pain.  How will I alternate your regular strength over-the-counter pain medication? You will take a dose of pain medication every three hours. ; Start by taking 650 mg of Tylenol (2 pills of 325 mg) ; 3 hours later take 600 mg of Motrin (3 pills of 200 mg) ; 3 hours after taking the Motrin take 650 mg of Tylenol ; 3 hours after that take 600 mg of Motrin.   - 1 -  See example - if your first dose of Tylenol is at 12:00 PM   12:00 PM Tylenol 650 mg (2 pills of 325 mg)  3:00 PM Motrin 600 mg (3 pills of 200 mg)  6:00 PM Tylenol 650 mg (2 pills of 325 mg)  9:00 PM Motrin 600 mg (3 pills of 200 mg)  Continue alternating every 3 hours   We recommend that you follow this schedule around-the-clock for at least 3 days after surgery, or until you feel that it is no longer needed. Use the table on the last page of this handout to keep track of the medications you are taking. Important: Do not take more than 3000mg  of Tylenol or 3200mg  of Motrin in a 24-hour period. Do not take ibuprofen/Motrin if you have a history of bleeding stomach ulcers, severe kidney disease, &/or actively taking a blood thinner  What if I still have pain? If you have pain that is not controlled with the over-the-counter pain medications (Tylenol and Motrin or Advil) you might have what we call breakthrough pain. You will receive a prescription for a small amount of an opioid pain medication such as Oxycodone, Tramadol, or Tylenol with Codeine. Use these opioid pills in the first 24 hours after surgery  if you have breakthrough pain. Do not take more than 1 pill every 4-6 hours.  If you still have uncontrolled pain after using all opioid pills, don't hesitate to call our staff using the number provided. We will help make sure you are managing your pain in the best way possible, and if necessary, we can provide a prescription for additional pain medication.   Day 1    Time  Name of Medication Number of pills taken  Amount of Acetaminophen  Pain Level   Comments  AM PM       AM PM       AM PM       AM PM       AM PM       AM PM       AM  PM       AM PM       Total Daily amount of Acetaminophen Do not take more than  3,000 mg per day      Day 2    Time  Name of Medication Number of pills taken  Amount of Acetaminophen  Pain Level   Comments  AM PM       AM PM       AM PM       AM PM       AM PM       AM PM       AM PM       AM PM       Total Daily amount of Acetaminophen Do not take more than  3,000 mg per day      Day 3    Time  Name of Medication Number of pills taken  Amount of Acetaminophen  Pain Level   Comments  AM PM       AM PM       AM PM       AM PM          AM PM       AM PM       AM PM       AM PM       Total Daily amount of Acetaminophen Do not take more than  3,000 mg per day      Day 4    Time  Name of Medication Number of pills taken  Amount of Acetaminophen  Pain Level   Comments  AM PM       AM PM       AM PM       AM PM       AM PM       AM PM       AM PM       AM PM       Total Daily amount of Acetaminophen Do not take more than  3,000 mg per day      Day 5    Time  Name of Medication Number of pills taken  Amount of Acetaminophen  Pain Level   Comments  AM PM       AM PM       AM PM       AM PM       AM PM       AM PM       AM PM       AM PM       Total Daily amount of Acetaminophen Do not take more than  3,000 mg per day       Day 6    Time  Name of Medication Number of pills taken    Amount of Acetaminophen  Pain Level  Comments  AM PM       AM PM       AM PM       AM PM       AM PM       AM PM       AM PM       AM PM       Total Daily amount of Acetaminophen Do not take more than  3,000 mg per day      Day 7    Time  Name of Medication Number of pills taken  Amount of Acetaminophen  Pain Level   Comments  AM PM       AM PM       AM PM       AM PM       AM PM       AM PM       AM PM       AM PM       Total Daily amount of Acetaminophen Do not take more than  3,000 mg per day        For additional information about how and where to safely dispose of unused opioid medications - [employer]  Disclaimer: This document contains information and/or instructional materials adapted from [place] for the typical patient with your condition. It does not replace medical advice from your health care provider because your experience may differ from that of the typical patient. Talk to your health care provider if you have any questions about this document, your condition or your treatment plan. Adapted from [place] Instructions  Activity: Get plenty of rest for the remainder of the day. A responsible individual must stay with you for 24 hours following the procedure.  For the next 24 hours, DO NOT: -Drive a car -Paediatric nurse -Drink alcoholic beverages -Take any medication unless instructed by your physician -Make any legal decisions or sign important papers.  Meals: Start with liquid foods such as gelatin or soup. Progress to regular foods as tolerated. Avoid greasy, spicy, heavy foods. If nausea and/or vomiting occur, drink only clear liquids until the nausea and/or vomiting subsides. Call your physician if vomiting continues.  Special Instructions/Symptoms: Your throat may feel dry or sore from the anesthesia or the breathing tube placed in your throat during surgery. If this  causes discomfort, gargle with warm salt water. The discomfort should disappear within 24 hours.  If you had a scopolamine patch placed behind your ear for the management of post- operative nausea and/or vomiting:  1. The medication in the patch is effective for 72 hours, after which it should be removed.  Wrap patch in a tissue and discard in the trash. Wash hands thoroughly with soap and water. 2. You may remove the patch earlier than 72 hours if you experience unpleasant side effects which may include dry mouth, dizziness or visual disturbances. 3. Avoid touching the patch. Wash your hands with soap and water after contact with the patch.

## 2018-06-16 NOTE — Anesthesia Procedure Notes (Signed)
Procedure Name: LMA Insertion Date/Time: 06/16/2018 1:50 PM Performed by: Lyndee Leo, CRNA Pre-anesthesia Checklist: Patient identified, Emergency Drugs available, Suction available and Patient being monitored Patient Re-evaluated:Patient Re-evaluated prior to induction Oxygen Delivery Method: Circle system utilized Preoxygenation: Pre-oxygenation with 100% oxygen Induction Type: IV induction Ventilation: Mask ventilation without difficulty LMA: LMA inserted LMA Size: 4.0 Number of attempts: 1 Airway Equipment and Method: Bite block Placement Confirmation: positive ETCO2 Tube secured with: Tape Dental Injury: Teeth and Oropharynx as per pre-operative assessment

## 2018-06-16 NOTE — Op Note (Signed)
Patient Name:           Kimberly Mccormick   Date of Surgery:        06/16/2018  Pre op Diagnosis:      Invasive cancer left breast  Post op Diagnosis:    Same  Procedure:                        Insertion of PowerPort [place] 8 French tunneled venous vascular access device       Use of fluoroscopy for guidance and positioning  Surgeon:                     Edsel Petrin. Dalbert Batman, M.D., FACS  Assistant:                      OR staff  Operative indications:           This is a 68 year old female who is brought to the operating room for insertion of Port-A-CathDr. Lindi Adie is her oncologist. Her PCP is Florene Route.     On May 06, 2018 she underwent left breast lumpectomy and sentinel node biopsy. Final pathology showed a 1.8 cm invasive ductal carcinoma in the superior left breast. Final margins negative. All 4 sentinel nodes negative. ER 95%. PVR 0. HER-2 negative. Ki-67. Stage TIc, N0. Oncotype score 30/19% risk. She has had several conversations with Dr. Lindi Adie and has decided to receive adjuvant chemotherapy She has no complaints about her breast or her axilla or her shoulder. I have discussed the indications, details, techniques, and numerous risk of Port-A-Cath insertion with her and her family.  She agrees with this plan.  Operative Findings:       Right subclavian venipuncture was uneventful.  The catheter initially passed across the midline and had to be repositioned with the guidewire.  At the end of the case the catheter tip was in the superior vena cava near the right atrial junction, flushed easily, had excellent blood return and there was no deformity.  Procedure in Detail:          Following the induction of general LMA anesthesia the patient was positioned with a small roll behind her shoulders and her arms tucked at her sides.  The neck and chest were prepped and draped in a sterile fashion.  Surgical timeout was performed.  Intravenous antibiotics were given.  0.5%  Marcaine with epinephrine was used as a local infiltration anesthetic.     A right subclavian venipuncture was performed.  I had excellent blood return on the first pass.  Guidewire was threaded uneventfully into the superior vena cava under fluoroscopic guidance.  A small incision was made at the wire insertion site.  Using fluoroscopy I drew a template on the chest wall to guide catheter length and positioning.      A transverse incision was made about 2 cm below the midpoint of the clavicle.  Subcutaneous pocket was created.  Using a tunneling device I passed the catheter from the wire insertion site to the port pocket site.  Using the template drawn on the chest wall I cut the catheter 21 cm in length.  The catheter was secured to the port with the locking device and the catheter and port were flushed.  The dilator and peel-away sheath assembly were inserted over the guidewire without any difficulty.  The wire and dilator were removed.  I threaded the catheter into the peel-away sheath.  It went about two thirds of the way in and then had a little bit of resistance.  As I slowly peeled the peel-away sheath back the catheter threaded easily and we had excellent blood return.  Fluoroscopy showed that the catheter had gone across the midline into the innominate vein.  I removed the port from the catheter and reinserted the wire.  Under fluoroscopy I redirected the catheter and wire down into the superior vena cava.  And I removed the wire.  Checked the catheter and had excellent blood return.  The catheter was then resecured to the port with the locking device.  I used rubber-shod vascular clamps to avoid air embolus throughout this process.  The patient did not have any arrhythmias during the procedure     the entire assembly was flushed with heparinized saline and I had excellent blood return.  The port was then secured to the pectoralis fascia at the port site with 3 interrupted sutures of 2-0 Prolene.   Fluoroscopy was done 1 more time and the catheter tip appeared to be in the superior vena cava near the right atrial junction and there was no deformity.  The port and catheter were flushed with concentrated heparin.  There was no bleeding    Subcutaneous tissue was closed with 3-0 Vicryl and the skin closed with subcuticular 4-0 Monocryl and Dermabond.  The patient tolerated the procedure well and was taken to PACU in stable condition.  EBL 20 cc or less.  Counts correct.  Complications none.  A chest x-ray is planned.    Addendum: I logged into the PMP aware website and reviewed her prescription medication history.     Edsel Petrin. Dalbert Batman, M.D., FACS General and Minimally Invasive Surgery Breast and Colorectal Surgery  06/16/2018 2:47 PM

## 2018-06-17 ENCOUNTER — Telehealth: Payer: Self-pay | Admitting: Hematology and Oncology

## 2018-06-17 ENCOUNTER — Encounter (HOSPITAL_BASED_OUTPATIENT_CLINIC_OR_DEPARTMENT_OTHER): Payer: Self-pay | Admitting: General Surgery

## 2018-06-17 NOTE — Telephone Encounter (Signed)
Scheduled appt per 5/20 sch message - pt is aware of appt date and time  For appt on 6/2

## 2018-06-19 ENCOUNTER — Telehealth: Payer: Self-pay | Admitting: Hematology and Oncology

## 2018-06-19 NOTE — Telephone Encounter (Signed)
Scheduled appt per 5/29 sch message.  Left a voice message of appt date and time.

## 2018-06-22 NOTE — Progress Notes (Signed)
Patient Care Team: Kimberly Labrum, MD as PCP - General  DIAGNOSIS:    ICD-10-CM   1. Malignant neoplasm of upper-inner quadrant of left breast in female, estrogen receptor positive ([place]) C50.212    Z17.0     SUMMARY OF ONCOLOGIC HISTORY:   Malignant neoplasm of upper-inner quadrant of left breast in female, estrogen receptor positive ([place])   03/24/2018 Initial Diagnosis    Screening detected left breast mass 8 mm upper inner quadrant biopsy revealed grade 2 IDC with DCIS ER 95%, PR 0%, Ki-67 20%, HER-2 -1+ by IHC, T1 BN 0 stage Ia clinical stage    05/06/2018 Surgery    Lumpectomy Dalbert Batman): IDC with DCIS, 1.8cm, grade 2, ER+ (95%), PR-, HER2 negative (1+, IHC), Ki67 20%, clear margins, 4 SLN negative.     05/18/2018 Oncotype testing    Oncotype DX recurrence score 30: risk of distant recurrence at 9 years is 19%. Chemo benefit is >15%.     06/23/2018 -  Chemotherapy    The patient had palonosetron (ALOXI) injection 0.25 mg, 0.25 mg, Intravenous,  Once, 1 of 4 cycles pegfilgrastim-cbqv (UDENYCA) injection 6 mg, 6 mg, Subcutaneous, Once, 1 of 4 cycles cyclophosphamide (CYTOXAN) 820 mg in sodium chloride 0.9 % 250 mL chemo infusion, 500 mg/m2 = 820 mg (100 % of original dose 500 mg/m2), Intravenous,  Once, 1 of 4 cycles Dose modification: 500 mg/m2 (original dose 500 mg/m2, Cycle 1, Reason: Provider Judgment) DOCEtaxel (TAXOTERE) 110 mg in sodium chloride 0.9 % 250 mL chemo infusion, 65 mg/m2 = 110 mg (100 % of original dose 65 mg/m2), Intravenous,  Once, 1 of 4 cycles Dose modification: 65 mg/m2 (original dose 65 mg/m2, Cycle 1, Reason: Provider Judgment)  for chemotherapy treatment.      CHIEF COMPLIANT: Cycle 1 Taxotere and Cytoxan  INTERVAL HISTORY: Kimberly Mccormick is a 68 y.o. with above-mentioned history of left breast cancer treated with lumpectomy. Her port was inserted by Dr. Dalbert Batman on 06/16/18. She presents to the clinic today to begin adjuvant chemotherapy with Taxotere and  Cytoxan.  She is anxious today to get started with treatment.  REVIEW OF SYSTEMS:   Constitutional: Denies fevers, chills or abnormal weight loss Eyes: Denies blurriness of vision Ears, nose, mouth, throat, and face: Mild to moderate hearing impairment Respiratory: Denies cough, dyspnea or wheezes Cardiovascular: Denies palpitation, chest discomfort Gastrointestinal: Denies nausea, heartburn or change in bowel habits Skin: Denies abnormal skin rashes Lymphatics: Denies new lymphadenopathy or easy bruising Neurological: Denies numbness, tingling or new weaknesses Behavioral/Psych: Mood is stable, no new changes  Extremities: No lower extremity edema  All other systems were reviewed with the patient and are negative.  I have reviewed the past medical history, past surgical history, social history and family history with the patient and they are unchanged from previous note.  ALLERGIES:  is allergic to eggs or egg-derived products; other; and penicillins.  MEDICATIONS:  Current Outpatient Medications  Medication Sig Dispense Refill  . acyclovir (ZOVIRAX) 400 MG tablet Take 400 mg by mouth 2 (two) times daily.     . cetirizine (ZYRTEC) 10 MG tablet Take 10 mg by mouth daily.    . Cholecalciferol (VITAMIN D) 2000 UNITS tablet Take 2,000 Units by mouth daily.    Marland Kitchen dexamethasone (DECADRON) 4 MG tablet Take 1 tablet (4 mg total) by mouth 2 (two) times daily. Take 1 tablet day before chemo and 1 tablet day after chemo with food 8 tablet 0  . HYDROcodone-acetaminophen (NORCO) 5-325  MG tablet Take 1-2 tablets by mouth every 6 (six) hours as needed for moderate pain or severe pain. 20 tablet 0  . HYDROcodone-acetaminophen (NORCO) 5-325 MG tablet Take 1-2 tablets by mouth every 6 (six) hours as needed for moderate pain or severe pain. 20 tablet 0  . lidocaine-prilocaine (EMLA) cream Apply to affected area once 30 g 3  . metoprolol succinate (TOPROL-XL) 25 MG 24 hr tablet Take 25 mg by mouth daily.   3  . Multiple Vitamins-Minerals (MULTIVITAMIN WITH MINERALS) tablet Take 1 tablet by mouth daily.    . ondansetron (ZOFRAN) 8 MG tablet Take 1 tablet (8 mg total) by mouth 2 (two) times daily as needed for refractory nausea / vomiting. Start on day 3 after chemo. 30 tablet 1  . prochlorperazine (COMPAZINE) 10 MG tablet Take 1 tablet (10 mg total) by mouth every 6 (six) hours as needed (Nausea or vomiting). 30 tablet 1  . zolpidem (AMBIEN) 10 MG tablet Take 10 mg by mouth at bedtime as needed for sleep.     No current facility-administered medications for this visit.    Facility-Administered Medications Ordered in Other Visits  Medication Dose Route Frequency Provider Last Rate Last Dose  . cyclophosphamide (CYTOXAN) 820 mg in sodium chloride 0.9 % 250 mL chemo infusion  500 mg/m2 (Treatment Plan Recorded) Intravenous Once Nicholas Lose, MD      . DOCEtaxel (TAXOTERE) 110 mg in sodium chloride 0.9 % 250 mL chemo infusion  65 mg/m2 (Treatment Plan Recorded) Intravenous Once Nicholas Lose, MD      . heparin lock flush 100 unit/mL  500 Units Intracatheter Once PRN Nicholas Lose, MD      . sodium chloride flush (NS) 0.9 % injection 10 mL  10 mL Intracatheter PRN Nicholas Lose, MD        PHYSICAL EXAMINATION: ECOG PERFORMANCE STATUS: 1 - Symptomatic but completely ambulatory  Vitals:   06/23/18 0908  BP: (!) 164/81  Pulse: 68  Resp: 18  Temp: 98.5 F (36.9 C)  SpO2: 99%   Filed Weights   06/23/18 0908  Weight: 137 lb (62.1 kg)    GENERAL: alert, no distress and comfortable SKIN: skin color, texture, turgor are normal, no rashes or significant lesions EYES: normal, Conjunctiva are pink and non-injected, sclera clear OROPHARYNX: no exudate, no erythema and lips, buccal mucosa, and tongue normal  NECK: supple, thyroid normal size, non-tender, without nodularity LYMPH: no palpable lymphadenopathy in the cervical, axillary or inguinal LUNGS: clear to auscultation and percussion with  normal breathing effort HEART: regular rate & rhythm and no murmurs and no lower extremity edema ABDOMEN: abdomen soft, non-tender and normal bowel sounds MUSCULOSKELETAL: no cyanosis of digits and no clubbing  NEURO: alert & oriented x 3 with fluent speech, no focal motor/sensory deficits EXTREMITIES: No lower extremity edema  LABORATORY DATA:  I have reviewed the data as listed CMP Latest Ref Rng & Units 06/23/2018 06/26/2017 04/15/2017  Glucose 70 - 99 mg/dL 162(H) 86 108(H)  BUN 8 - 23 mg/dL _0 Creatinine 0.44 - 1.00 mg/dL 0.81 1.03(H) 2.23(H)  Sodium 135 - 145 mmol/L 141 140 142  Potassium 3.5 - 5.1 mmol/L 3.9 3.7 3.6  Chloride 98 - 111 mmol/L 108 108 106  CO2 22 - 32 mmol/L _1 Calcium 8.9 - 10.3 mg/dL 9.2 8.7(L) 9.3  Total Protein 6.5 - 8.1 g/dL 7.5 6.9 -  Total Bilirubin 0.3 - 1.2 mg/dL 0.7 0.9 -  Alkaline Phos 38 -  126 U/L 124 94 -  AST 15 - 41 U/L 27 20 -  ALT 0 - 44 U/L 30 13(L) -    Lab Results  Component Value Date   WBC 12.9 (H) 06/23/2018   HGB 11.8 (L) 06/23/2018   HCT 36.1 06/23/2018   MCV 80.4 06/23/2018   PLT 263 06/23/2018   NEUTROABS 10.2 (H) 06/23/2018    ASSESSMENT & PLAN:  Malignant neoplasm of upper-inner quadrant of left breast in female, estrogen receptor positive ([place]) 05/08/2018 lumpectomy Dalbert Batman): IDC with DCIS, 1.8cm, grade 2, ER+ (95%), PR-, HER2 negative (1+, IHC), Ki67 20%, clear margins, 4 SLN negative.  Oncotype DX score 30: Distant recurrence risk at 7 years 19% I discussed with her that this would be considered to be high risk and that she would need systemic chemotherapy.  Treatment plan: 1.  Adjuvant chemotherapy with Taxotere and Cytoxan x4 2.  Adjuvant radiation therapy 3.  Followed by adjuvant antiestrogen therapy ---------------------------------------------------------------------------------------------------------------------- Current treatment: Cycle 1 Taxotere and Cytoxan Labs reviewed Antiemetics reviewed  Chemo consent obtained Chemo education completed  Elevated blood sugars: Due to steroids.  I instructed her to take only 1 tablet the day before chemo and 1 tablet day after. Return to clinic in 1 week for toxicity check.    No orders of the defined types were placed in this encounter.  The patient has a good understanding of the overall plan. she agrees with it. she will call with any problems that may develop before the next visit here.  Nicholas Lose, MD 06/23/2018  Julious Oka Dorshimer am acting as scribe for Dr. Nicholas Lose.  I have reviewed the above documentation for accuracy and completeness, and I agree with the above.

## 2018-06-23 ENCOUNTER — Other Ambulatory Visit: Payer: Self-pay

## 2018-06-23 ENCOUNTER — Inpatient Hospital Stay: Payer: Medicare HMO | Attending: Hematology and Oncology

## 2018-06-23 ENCOUNTER — Inpatient Hospital Stay (HOSPITAL_BASED_OUTPATIENT_CLINIC_OR_DEPARTMENT_OTHER): Payer: Medicare HMO | Admitting: Hematology and Oncology

## 2018-06-23 ENCOUNTER — Inpatient Hospital Stay: Payer: Medicare HMO

## 2018-06-23 VITALS — BP 169/79 | HR 61 | Temp 98.5°F | Resp 17

## 2018-06-23 DIAGNOSIS — T380X5A Adverse effect of glucocorticoids and synthetic analogues, initial encounter: Secondary | ICD-10-CM

## 2018-06-23 DIAGNOSIS — C50212 Malignant neoplasm of upper-inner quadrant of left female breast: Secondary | ICD-10-CM | POA: Insufficient documentation

## 2018-06-23 DIAGNOSIS — M545 Low back pain: Secondary | ICD-10-CM

## 2018-06-23 DIAGNOSIS — R102 Pelvic and perineal pain: Secondary | ICD-10-CM | POA: Diagnosis not present

## 2018-06-23 DIAGNOSIS — Z880 Allergy status to penicillin: Secondary | ICD-10-CM

## 2018-06-23 DIAGNOSIS — R739 Hyperglycemia, unspecified: Secondary | ICD-10-CM | POA: Insufficient documentation

## 2018-06-23 DIAGNOSIS — R51 Headache: Secondary | ICD-10-CM | POA: Insufficient documentation

## 2018-06-23 DIAGNOSIS — Z5111 Encounter for antineoplastic chemotherapy: Secondary | ICD-10-CM | POA: Insufficient documentation

## 2018-06-23 DIAGNOSIS — Z170 Estrogen receptor positive status [ER+]: Secondary | ICD-10-CM

## 2018-06-23 DIAGNOSIS — Z95828 Presence of other vascular implants and grafts: Secondary | ICD-10-CM | POA: Insufficient documentation

## 2018-06-23 DIAGNOSIS — Z79899 Other long term (current) drug therapy: Secondary | ICD-10-CM | POA: Diagnosis not present

## 2018-06-23 DIAGNOSIS — Z5189 Encounter for other specified aftercare: Secondary | ICD-10-CM | POA: Insufficient documentation

## 2018-06-23 HISTORY — DX: Presence of other vascular implants and grafts: Z95.828

## 2018-06-23 LAB — CMP (CANCER CENTER ONLY)
ALT: 30 U/L (ref 0–44)
AST: 27 U/L (ref 15–41)
Albumin: 3.6 g/dL (ref 3.5–5.0)
Alkaline Phosphatase: 124 U/L (ref 38–126)
Anion gap: 10 (ref 5–15)
BUN: 18 mg/dL (ref 8–23)
CO2: 23 mmol/L (ref 22–32)
Calcium: 9.2 mg/dL (ref 8.9–10.3)
Chloride: 108 mmol/L (ref 98–111)
Creatinine: 0.81 mg/dL (ref 0.44–1.00)
GFR, Est AFR Am: >60 mL/min (ref >60)
GFR, Estimated: >60 mL/min (ref >60)
Glucose, Bld: 162 mg/dL — ABNORMAL HIGH (ref 70–99)
Potassium: 3.9 mmol/L (ref 3.5–5.1)
Sodium: 141 mmol/L (ref 135–145)
Total Bilirubin: 0.7 mg/dL (ref 0.3–1.2)
Total Protein: 7.5 g/dL (ref 6.5–8.1)

## 2018-06-23 LAB — CBC WITH DIFFERENTIAL (CANCER CENTER ONLY)
Abs Immature Granulocytes: 0.26 10*3/uL — ABNORMAL HIGH (ref 0.00–0.07)
Basophils Absolute: 0 10*3/uL (ref 0.0–0.1)
Basophils Relative: 0 %
Eosinophils Absolute: 0 10*3/uL (ref 0.0–0.5)
Eosinophils Relative: 0 %
HCT: 36.1 % (ref 36.0–46.0)
Hemoglobin: 11.8 g/dL — ABNORMAL LOW (ref 12.0–15.0)
Immature Granulocytes: 2 %
Lymphocytes Relative: 17 %
Lymphs Abs: 2.2 10*3/uL (ref 0.7–4.0)
MCH: 26.3 pg (ref 26.0–34.0)
MCHC: 32.7 g/dL (ref 30.0–36.0)
MCV: 80.4 fL (ref 80.0–100.0)
Monocytes Absolute: 0.2 10*3/uL (ref 0.1–1.0)
Monocytes Relative: 2 %
Neutro Abs: 10.2 10*3/uL — ABNORMAL HIGH (ref 1.7–7.7)
Neutrophils Relative %: 79 %
Platelet Count: 263 10*3/uL (ref 150–400)
RBC: 4.49 MIL/uL (ref 3.87–5.11)
RDW: 13.2 % (ref 11.5–15.5)
WBC Count: 12.9 10*3/uL — ABNORMAL HIGH (ref 4.0–10.5)
nRBC: 0 % (ref 0.0–0.2)

## 2018-06-23 MED ORDER — SODIUM CHLORIDE 0.9 % IV SOLN
Freq: Once | INTRAVENOUS | Status: AC
  Administered 2018-06-23: 10:00:00 via INTRAVENOUS
  Filled 2018-06-23: qty 250

## 2018-06-23 MED ORDER — SODIUM CHLORIDE 0.9% FLUSH
10.0000 mL | INTRAVENOUS | Status: DC | PRN
  Administered 2018-06-23: 10 mL
  Filled 2018-06-23: qty 10

## 2018-06-23 MED ORDER — SODIUM CHLORIDE 0.9 % IV SOLN
500.0000 mg/m2 | Freq: Once | INTRAVENOUS | Status: AC
  Administered 2018-06-23: 820 mg via INTRAVENOUS
  Filled 2018-06-23: qty 41

## 2018-06-23 MED ORDER — ACETAMINOPHEN 325 MG PO TABS
ORAL_TABLET | ORAL | Status: AC
  Filled 2018-06-23: qty 1

## 2018-06-23 MED ORDER — DEXAMETHASONE SODIUM PHOSPHATE 10 MG/ML IJ SOLN
10.0000 mg | Freq: Once | INTRAMUSCULAR | Status: AC
  Administered 2018-06-23: 10 mg via INTRAVENOUS

## 2018-06-23 MED ORDER — PALONOSETRON HCL INJECTION 0.25 MG/5ML
0.2500 mg | Freq: Once | INTRAVENOUS | Status: AC
  Administered 2018-06-23: 0.25 mg via INTRAVENOUS

## 2018-06-23 MED ORDER — ACETAMINOPHEN 325 MG PO TABS
650.0000 mg | ORAL_TABLET | Freq: Once | ORAL | Status: AC
  Administered 2018-06-23: 650 mg via ORAL

## 2018-06-23 MED ORDER — PALONOSETRON HCL INJECTION 0.25 MG/5ML
INTRAVENOUS | Status: AC
  Filled 2018-06-23: qty 5

## 2018-06-23 MED ORDER — ACETAMINOPHEN 325 MG PO TABS
ORAL_TABLET | ORAL | Status: AC
  Filled 2018-06-23: qty 2

## 2018-06-23 MED ORDER — DEXAMETHASONE SODIUM PHOSPHATE 10 MG/ML IJ SOLN
INTRAMUSCULAR | Status: AC
  Filled 2018-06-23: qty 1

## 2018-06-23 MED ORDER — SODIUM CHLORIDE 0.9 % IV SOLN
65.0000 mg/m2 | Freq: Once | INTRAVENOUS | Status: AC
  Administered 2018-06-23: 110 mg via INTRAVENOUS
  Filled 2018-06-23: qty 11

## 2018-06-23 MED ORDER — HEPARIN SOD (PORK) LOCK FLUSH 100 UNIT/ML IV SOLN
500.0000 [IU] | Freq: Once | INTRAVENOUS | Status: AC | PRN
  Administered 2018-06-23: 500 [IU]
  Filled 2018-06-23: qty 5

## 2018-06-23 NOTE — Progress Notes (Signed)
Spoke with pt this am before treatment & gave Education packet.  Reviewed EMLA use, support info, anti-nausea meds, symptom management clinic, Chemo ALert Card, peripheral neuropathy & icing to help prevent & chemotherapy drugs: taxotere & cytoxan.  Pt asked about hair loss & informed that she probably would loose her hair with this combination of drugs.  She expressed understanding.

## 2018-06-23 NOTE — Patient Instructions (Signed)
[facility] Discharge Instructions for Patients Receiving Chemotherapy  Today you received the following chemotherapy agents Taxotere and Cytoxan  To help prevent nausea and vomiting after your treatment, we encourage you to take your nausea medication as directed.   If you develop nausea and vomiting that is not controlled by your nausea medication, call the clinic.   BELOW ARE SYMPTOMS THAT SHOULD BE REPORTED IMMEDIATELY:  *FEVER GREATER THAN 100.5 F  *CHILLS WITH OR WITHOUT FEVER  NAUSEA AND VOMITING THAT IS NOT CONTROLLED WITH YOUR NAUSEA MEDICATION  *UNUSUAL SHORTNESS OF BREATH  *UNUSUAL BRUISING OR BLEEDING  TENDERNESS IN MOUTH AND THROAT WITH OR WITHOUT PRESENCE OF ULCERS  *URINARY PROBLEMS  *BOWEL PROBLEMS  UNUSUAL RASH Items with * indicate a potential emergency and should be followed up as soon as possible.  Feel free to call the clinic should you have any questions or concerns. The clinic phone number is [phone].  Please show the [place] at check-in to the Emergency Department and triage nurse.  Docetaxel injection What is this medicine? DOCETAXEL (doe se TAX el) is a chemotherapy drug. It targets fast dividing cells, like cancer cells, and causes these cells to die. This medicine is used to treat many types of cancers like breast cancer, certain stomach cancers, head and neck cancer, lung cancer, and prostate cancer. This medicine may be used for other purposes; ask your health care provider or pharmacist if you have questions. COMMON BRAND NAME(S): Docefrez, Taxotere What should I tell my health care provider before I take this medicine? They need to know if you have any of these conditions: -infection (especially a virus infection such as chickenpox, cold sores, or herpes) -liver disease -low blood counts, like low white cell, platelet, or red cell counts -an unusual or allergic reaction to docetaxel, polysorbate 80, other  chemotherapy agents, other medicines, foods, dyes, or preservatives -pregnant or trying to get pregnant -breast-feeding How should I use this medicine? This drug is given as an infusion into a vein. It is administered in a hospital or clinic by a specially trained health care professional. Talk to your pediatrician regarding the use of this medicine in children. Special care may be needed. Overdosage: If you think you have taken too much of this medicine contact a poison control center or emergency room at once. NOTE: This medicine is only for you. Do not share this medicine with others. What if I miss a dose? It is important not to miss your dose. Call your doctor or health care professional if you are unable to keep an appointment. What may interact with this medicine? -cyclosporine -erythromycin -ketoconazole -medicines to increase blood counts like filgrastim, pegfilgrastim, sargramostim -vaccines Talk to your doctor or health care professional before taking any of these medicines: -acetaminophen -aspirin -ibuprofen -ketoprofen -naproxen This list may not describe all possible interactions. Give your health care provider a list of all the medicines, herbs, non-prescription drugs, or dietary supplements you use. Also tell them if you smoke, drink alcohol, or use illegal drugs. Some items may interact with your medicine. What should I watch for while using this medicine? Your condition will be monitored carefully while you are receiving this medicine. You will need important blood work done while you are taking this medicine. This drug may make you feel generally unwell. This is not uncommon, as chemotherapy can affect healthy cells as well as cancer cells. Report any side effects. Continue your course of treatment even though you feel  ill unless your doctor tells you to stop. In some cases, you may be given additional medicines to help with side effects. Follow all directions for their  use. Call your doctor or health care professional for advice if you get a fever, chills or sore throat, or other symptoms of a cold or flu. Do not treat yourself. This drug decreases your body's ability to fight infections. Try to avoid being around people who are sick. This medicine may increase your risk to bruise or bleed. Call your doctor or health care professional if you notice any unusual bleeding. This medicine may contain alcohol in the product. You may get drowsy or dizzy. Do not drive, use machinery, or do anything that needs mental alertness until you know how this medicine affects you. Do not stand or sit up quickly, especially if you are an older patient. This reduces the risk of dizzy or fainting spells. Avoid alcoholic drinks. Do not become pregnant while taking this medicine or for 6 months after stopping it. Women should inform their doctor if they wish to become pregnant or think they might be pregnant. Men should not father a child while taking this medicine and for 3 months after stopping it. There is a potential for serious side effects to an unborn child. Talk to your health care professional or pharmacist for more information. Do not breast-feed an infant while taking this medicine or for 2 weeks after stopping it. This may interfere with the ability to father a child. You should talk to your doctor or health care professional if you are concerned about your fertility. What side effects may I notice from receiving this medicine? Side effects that you should report to your doctor or health care professional as soon as possible: -allergic reactions like skin rash, itching or hives, swelling of the face, lips, or tongue -low blood counts - This drug may decrease the number of white blood cells, red blood cells and platelets. You may be at increased risk for infections and bleeding. -signs of infection - fever or chills, cough, sore throat, pain or difficulty passing urine -signs of  decreased platelets or bleeding - bruising, pinpoint red spots on the skin, black, tarry stools, nosebleeds -signs of decreased red blood cells - unusually weak or tired, fainting spells, lightheadedness -breathing problems -fast or irregular heartbeat -low blood pressure -mouth sores -nausea and vomiting -pain, swelling, redness or irritation at the injection site -pain, tingling, numbness in the hands or feet -swelling of the ankle, feet, hands -weight gain Side effects that usually do not require medical attention (report to your doctor or health care professional if they continue or are bothersome): -bone pain -complete hair loss including hair on your head, underarms, pubic hair, eyebrows, and eyelashes -diarrhea -excessive tearing -changes in the color of fingernails -loosening of the fingernails -nausea -muscle pain -red flush to skin -sweating -weak or tired This list may not describe all possible side effects. Call your doctor for medical advice about side effects. You may report side effects to FDA at 1-800-FDA-1088. Where should I keep my medicine? This drug is given in a hospital or clinic and will not be stored at home. NOTE: This sheet is a summary. It may not cover all possible information. If you have questions about this medicine, talk to your doctor, pharmacist, or health care provider.  2019 Elsevier/Gold Standard (2017-02-03 12:07:21)  Cyclophosphamide injection What is this medicine? CYCLOPHOSPHAMIDE (sye kloe FOSS fa mide) is a chemotherapy drug. It slows the  growth of cancer cells. This medicine is used to treat many types of cancer like lymphoma, myeloma, leukemia, breast cancer, and ovarian cancer, to name a few. This medicine may be used for other purposes; ask your health care provider or pharmacist if you have questions. COMMON BRAND NAME(S): Cytoxan, Neosar What should I tell my health care provider before I take this medicine? They need to know if you  have any of these conditions: -blood disorders -history of other chemotherapy -infection -kidney disease -liver disease -recent or ongoing radiation therapy -tumors in the bone marrow -an unusual or allergic reaction to cyclophosphamide, other chemotherapy, other medicines, foods, dyes, or preservatives -pregnant or trying to get pregnant -breast-feeding How should I use this medicine? This drug is usually given as an injection into a vein or muscle or by infusion into a vein. It is administered in a hospital or clinic by a specially trained health care professional. Talk to your pediatrician regarding the use of this medicine in children. Special care may be needed. Overdosage: If you think you have taken too much of this medicine contact a poison control center or emergency room at once. NOTE: This medicine is only for you. Do not share this medicine with others. What if I miss a dose? It is important not to miss your dose. Call your doctor or health care professional if you are unable to keep an appointment. What may interact with this medicine? This medicine may interact with the following medications: -amiodarone -amphotericin B -azathioprine -certain antiviral medicines for HIV or AIDS such as protease inhibitors (e.g., indinavir, ritonavir) and zidovudine -certain blood pressure medications such as benazepril, captopril, enalapril, fosinopril, lisinopril, moexipril, monopril, perindopril, quinapril, ramipril, trandolapril -certain cancer medications such as anthracyclines (e.g., daunorubicin, doxorubicin), busulfan, cytarabine, paclitaxel, pentostatin, tamoxifen, trastuzumab -certain diuretics such as chlorothiazide, chlorthalidone, hydrochlorothiazide, indapamide, metolazone -certain medicines that treat or prevent blood clots like warfarin -certain muscle relaxants such as succinylcholine -cyclosporine -etanercept -indomethacin -medicines to increase blood counts like  filgrastim, pegfilgrastim, sargramostim -medicines used as general anesthesia -metronidazole -natalizumab This list may not describe all possible interactions. Give your health care provider a list of all the medicines, herbs, non-prescription drugs, or dietary supplements you use. Also tell them if you smoke, drink alcohol, or use illegal drugs. Some items may interact with your medicine. What should I watch for while using this medicine? Visit your doctor for checks on your progress. This drug may make you feel generally unwell. This is not uncommon, as chemotherapy can affect healthy cells as well as cancer cells. Report any side effects. Continue your course of treatment even though you feel ill unless your doctor tells you to stop. Drink water or other fluids as directed. Urinate often, even at night. In some cases, you may be given additional medicines to help with side effects. Follow all directions for their use. Call your doctor or health care professional for advice if you get a fever, chills or sore throat, or other symptoms of a cold or flu. Do not treat yourself. This drug decreases your body's ability to fight infections. Try to avoid being around people who are sick. This medicine may increase your risk to bruise or bleed. Call your doctor or health care professional if you notice any unusual bleeding. Be careful brushing and flossing your teeth or using a toothpick because you may get an infection or bleed more easily. If you have any dental work done, tell your dentist you are receiving this medicine. You may  get drowsy or dizzy. Do not drive, use machinery, or do anything that needs mental alertness until you know how this medicine affects you. Do not become pregnant while taking this medicine or for 1 year after stopping it. Women should inform their doctor if they wish to become pregnant or think they might be pregnant. Men should not father a child while taking this medicine and for  4 months after stopping it. There is a potential for serious side effects to an unborn child. Talk to your health care professional or pharmacist for more information. Do not breast-feed an infant while taking this medicine. This medicine may interfere with the ability to have a child. This medicine has caused ovarian failure in some women. This medicine has caused reduced sperm counts in some men. You should talk with your doctor or health care professional if you are concerned about your fertility. If you are going to have surgery, tell your doctor or health care professional that you have taken this medicine. What side effects may I notice from receiving this medicine? Side effects that you should report to your doctor or health care professional as soon as possible: -allergic reactions like skin rash, itching or hives, swelling of the face, lips, or tongue -low blood counts - this medicine may decrease the number of white blood cells, red blood cells and platelets. You may be at increased risk for infections and bleeding. -signs of infection - fever or chills, cough, sore throat, pain or difficulty passing urine -signs of decreased platelets or bleeding - bruising, pinpoint red spots on the skin, black, tarry stools, blood in the urine -signs of decreased red blood cells - unusually weak or tired, fainting spells, lightheadedness -breathing problems -dark urine -dizziness -palpitations -swelling of the ankles, feet, hands -trouble passing urine or change in the amount of urine -weight gain -yellowing of the eyes or skin Side effects that usually do not require medical attention (report to your doctor or health care professional if they continue or are bothersome): -changes in nail or skin color -hair loss -missed menstrual periods -mouth sores -nausea, vomiting This list may not describe all possible side effects. Call your doctor for medical advice about side effects. You may report side  effects to FDA at 1-800-FDA-1088. Where should I keep my medicine? This drug is given in a hospital or clinic and will not be stored at home. NOTE: This sheet is a summary. It may not cover all possible information. If you have questions about this medicine, talk to your doctor, pharmacist, or health care provider.  2019 Elsevier/Gold Standard (2011-11-22 16:22:58)

## 2018-06-23 NOTE — Assessment & Plan Note (Signed)
05/08/2018 lumpectomy Kimberly Mccormick): IDC with DCIS, 1.8cm, grade 2, ER+ (95%), PR-, HER2 negative (1+, IHC), Ki67 20%, clear margins, 4 SLN negative.  Oncotype DX score 30: Distant recurrence risk at 7 years 19% I discussed with her that this would be considered to be high risk and that she would need systemic chemotherapy.  Treatment plan: 1.  Adjuvant chemotherapy with Taxotere and Cytoxan x4 2.  Adjuvant radiation therapy 3.  Followed by adjuvant antiestrogen therapy ---------------------------------------------------------------------------------------------------------------------- Current treatment: Cycle 1 Taxotere and Cytoxan Labs reviewed Antiemetics reviewed Chemo consent obtained Chemo education completed  Return to clinic in 1 week for toxicity check.

## 2018-06-23 NOTE — Progress Notes (Signed)
Okay per Dr. Lindi Adie for one time dose of Tylenol 650mg  due to headache.

## 2018-06-23 NOTE — Assessment & Plan Note (Addendum)
05/08/2018 lumpectomy Kimberly Mccormick): IDC with DCIS, 1.8cm, grade 2, ER+ (95%), PR-, HER2 negative (1+, IHC), Ki67 20%, clear margins, 4 SLN negative. Oncotype DX score 30: Distant recurrence risk at 9 years 19% Treatment plan: 1.  Adjuvant chemotherapy with Taxotere and Cytoxan x4 2.  Adjuvant radiation therapy 3.  Followed by adjuvant antiestrogen therapy ---------------------------------------------------------------------------------------------------------------------------------------------- Current treatment: Cycle 1 day 1 Taxotere and Cytoxan every 3 weeks Labs reviewed Chemo consent obtained Chemo education completed Antiemetics were reviewed Return to clinic in 1 week for toxicity check

## 2018-06-24 ENCOUNTER — Telehealth: Payer: Self-pay

## 2018-06-24 NOTE — Telephone Encounter (Signed)
RN placed call for first time chemotherapy follow up.  Left voicemail for patient to return call.

## 2018-06-25 ENCOUNTER — Inpatient Hospital Stay: Payer: Medicare HMO

## 2018-06-25 ENCOUNTER — Telehealth: Payer: Self-pay

## 2018-06-25 ENCOUNTER — Other Ambulatory Visit: Payer: Self-pay

## 2018-06-25 VITALS — BP 168/79 | HR 62 | Temp 98.7°F | Resp 18

## 2018-06-25 DIAGNOSIS — R51 Headache: Secondary | ICD-10-CM | POA: Diagnosis not present

## 2018-06-25 DIAGNOSIS — M545 Low back pain: Secondary | ICD-10-CM | POA: Diagnosis not present

## 2018-06-25 DIAGNOSIS — Z170 Estrogen receptor positive status [ER+]: Secondary | ICD-10-CM

## 2018-06-25 DIAGNOSIS — C50212 Malignant neoplasm of upper-inner quadrant of left female breast: Secondary | ICD-10-CM

## 2018-06-25 DIAGNOSIS — R739 Hyperglycemia, unspecified: Secondary | ICD-10-CM | POA: Diagnosis not present

## 2018-06-25 DIAGNOSIS — T380X5A Adverse effect of glucocorticoids and synthetic analogues, initial encounter: Secondary | ICD-10-CM | POA: Diagnosis not present

## 2018-06-25 DIAGNOSIS — R102 Pelvic and perineal pain: Secondary | ICD-10-CM | POA: Diagnosis not present

## 2018-06-25 DIAGNOSIS — Z5189 Encounter for other specified aftercare: Secondary | ICD-10-CM | POA: Diagnosis not present

## 2018-06-25 DIAGNOSIS — Z5111 Encounter for antineoplastic chemotherapy: Secondary | ICD-10-CM | POA: Diagnosis not present

## 2018-06-25 MED ORDER — PEGFILGRASTIM-CBQV 6 MG/0.6ML ~~LOC~~ SOSY
PREFILLED_SYRINGE | SUBCUTANEOUS | Status: AC
  Filled 2018-06-25: qty 0.6

## 2018-06-25 MED ORDER — PEGFILGRASTIM-CBQV 6 MG/0.6ML ~~LOC~~ SOSY
6.0000 mg | PREFILLED_SYRINGE | Freq: Once | SUBCUTANEOUS | Status: AC
  Administered 2018-06-25: 6 mg via SUBCUTANEOUS

## 2018-06-25 NOTE — Patient Instructions (Signed)
Pegfilgrastim injection  What is this medicine?  PEGFILGRASTIM (PEG fil gra stim) is a long-acting granulocyte colony-stimulating factor that stimulates the growth of neutrophils, a type of white blood cell important in the body's fight against infection. It is used to reduce the incidence of fever and infection in patients with certain types of cancer who are receiving chemotherapy that affects the bone marrow, and to increase survival after being exposed to high doses of radiation.  This medicine may be used for other purposes; ask your health care provider or pharmacist if you have questions.  COMMON BRAND NAME(S): Fulphila, Neulasta, UDENYCA  What should I tell my health care provider before I take this medicine?  They need to know if you have any of these conditions:  -kidney disease  -latex allergy  -ongoing radiation therapy  -sickle cell disease  -skin reactions to acrylic adhesives (On-Body Injector only)  -an unusual or allergic reaction to pegfilgrastim, filgrastim, other medicines, foods, dyes, or preservatives  -pregnant or trying to get pregnant  -breast-feeding  How should I use this medicine?  This medicine is for injection under the skin. If you get this medicine at home, you will be taught how to prepare and give the pre-filled syringe or how to use the On-body Injector. Refer to the patient Instructions for Use for detailed instructions. Use exactly as directed. Tell your healthcare provider immediately if you suspect that the On-body Injector may not have performed as intended or if you suspect the use of the On-body Injector resulted in a missed or partial dose.  It is important that you put your used needles and syringes in a special sharps container. Do not put them in a trash can. If you do not have a sharps container, call your pharmacist or healthcare provider to get one.  Talk to your pediatrician regarding the use of this medicine in children. While this drug may be prescribed for  selected conditions, precautions do apply.  Overdosage: If you think you have taken too much of this medicine contact a poison control center or emergency room at once.  NOTE: This medicine is only for you. Do not share this medicine with others.  What if I miss a dose?  It is important not to miss your dose. Call your doctor or health care professional if you miss your dose. If you miss a dose due to an On-body Injector failure or leakage, a new dose should be administered as soon as possible using a single prefilled syringe for manual use.  What may interact with this medicine?  Interactions have not been studied.  Give your health care provider a list of all the medicines, herbs, non-prescription drugs, or dietary supplements you use. Also tell them if you smoke, drink alcohol, or use illegal drugs. Some items may interact with your medicine.  This list may not describe all possible interactions. Give your health care provider a list of all the medicines, herbs, non-prescription drugs, or dietary supplements you use. Also tell them if you smoke, drink alcohol, or use illegal drugs. Some items may interact with your medicine.  What should I watch for while using this medicine?  You may need blood work done while you are taking this medicine.  If you are going to need a MRI, CT scan, or other procedure, tell your doctor that you are using this medicine (On-Body Injector only).  What side effects may I notice from receiving this medicine?  Side effects that you should report to   your doctor or health care professional as soon as possible:  -allergic reactions like skin rash, itching or hives, swelling of the face, lips, or tongue  -back pain  -dizziness  -fever  -pain, redness, or irritation at site where injected  -pinpoint red spots on the skin  -red or dark-brown urine  -shortness of breath or breathing problems  -stomach or side pain, or pain at the shoulder  -swelling  -tiredness  -trouble passing urine or  change in the amount of urine  Side effects that usually do not require medical attention (report to your doctor or health care professional if they continue or are bothersome):  -bone pain  -muscle pain  This list may not describe all possible side effects. Call your doctor for medical advice about side effects. You may report side effects to FDA at 1-800-FDA-1088.  Where should I keep my medicine?  Keep out of the reach of children.  If you are using this medicine at home, you will be instructed on how to store it. Throw away any unused medicine after the expiration date on the label.  NOTE: This sheet is a summary. It may not cover all possible information. If you have questions about this medicine, talk to your doctor, pharmacist, or health care provider.   2019 Elsevier/Gold Standard (2017-04-14 16:57:08)

## 2018-06-25 NOTE — Telephone Encounter (Signed)
RN spoke with patient to follow up after first chemotherapy.  Pt reports feeling well.  Tolerating fluids and foods well. Denies any fever or nausea.  Pt reports no BM since 6/1.  Pt attempting prune juice.  RN educated on stool softeners and Miralax if prune juice is ineffective.  Voiced understanding.

## 2018-06-29 ENCOUNTER — Telehealth: Payer: Self-pay | Admitting: *Deleted

## 2018-06-29 NOTE — Telephone Encounter (Signed)
Called pt regarding message RN receive from [facility] over the weekend.  Pt states since receiving her Udenyca injection, she has been experiencing severe bone pain in her lower back and hips not relieved by hydrocodone.  Pt educated on taking Claritin OTC to help relieve the bone pain.  Pt also educated in the future she should take Claritin the day before and 4 days after her injection.  Pt verbalized understanding.  She has F/U with Dr. Lindi Adie tomorrow and will let us know if the Claritin relieved the pain.

## 2018-06-29 NOTE — Progress Notes (Signed)
Patient Care Team: Curlene Labrum, MD as PCP - General  DIAGNOSIS:    ICD-10-CM   1. Malignant neoplasm of upper-inner quadrant of left breast in female, estrogen receptor positive ([place]) C50.212    Z17.0     SUMMARY OF ONCOLOGIC HISTORY:   Malignant neoplasm of upper-inner quadrant of left breast in female, estrogen receptor positive ([place])   03/24/2018 Initial Diagnosis    Screening detected left breast mass 8 mm upper inner quadrant biopsy revealed grade 2 IDC with DCIS ER 95%, PR 0%, Ki-67 20%, HER-2 -1+ by IHC, T1 BN 0 stage Ia clinical stage    05/06/2018 Surgery    Lumpectomy Dalbert Batman): IDC with DCIS, 1.8cm, grade 2, ER+ (95%), PR-, HER2 negative (1+, IHC), Ki67 20%, clear margins, 4 SLN negative.     05/18/2018 Oncotype testing    Oncotype DX recurrence score 30: risk of distant recurrence at 9 years is 19%. Chemo benefit is >15%.     06/23/2018 -  Chemotherapy    The patient had palonosetron (ALOXI) injection 0.25 mg, 0.25 mg, Intravenous,  Once, 1 of 4 cycles Administration: 0.25 mg (06/23/2018) pegfilgrastim-cbqv (UDENYCA) injection 6 mg, 6 mg, Subcutaneous, Once, 1 of 4 cycles cyclophosphamide (CYTOXAN) 820 mg in sodium chloride 0.9 % 250 mL chemo infusion, 500 mg/m2 = 820 mg (100 % of original dose 500 mg/m2), Intravenous,  Once, 1 of 4 cycles Dose modification: 500 mg/m2 (original dose 500 mg/m2, Cycle 1, Reason: Provider Judgment) Administration: 820 mg (06/23/2018) DOCEtaxel (TAXOTERE) 110 mg in sodium chloride 0.9 % 250 mL chemo infusion, 65 mg/m2 = 110 mg (100 % of original dose 65 mg/m2), Intravenous,  Once, 1 of 4 cycles Dose modification: 65 mg/m2 (original dose 65 mg/m2, Cycle 1, Reason: Provider Judgment) Administration: 110 mg (06/23/2018)  for chemotherapy treatment.      CHIEF COMPLIANT: Cycle 1 Day 8 Taxotere and Cytoxan  INTERVAL HISTORY: Kimberly Mccormick is a 68 y.o. with above-mentioned history of left breast cancer treated with lumpectomy. She presents to the  clinic today for a toxicity check following cycle 1 of adjuvant chemotherapy with Taxotere and Cytoxan.  She complains of severe pain in the lower back and in the pelvic area that lasted for the last week since the booster shot.  She did not take Claritin.  She had some hydrocodone and was taking it and it helped her.  She was not able to sleep because of the pain and because that she has a headache.  Denied any nausea or vomiting.  She has excellent appetite.  REVIEW OF SYSTEMS:   Constitutional: Denies fevers, chills or abnormal weight loss, complains of severe back pain and pelvic pain due to Udenyca Eyes: Denies blurriness of vision Ears, nose, mouth, throat, and face: Denies mucositis or sore throat Respiratory: Denies cough, dyspnea or wheezes Cardiovascular: Denies palpitation, chest discomfort Gastrointestinal: Denies nausea, heartburn or change in bowel habits Skin: Denies abnormal skin rashes Lymphatics: Denies new lymphadenopathy or easy bruising Neurological: Denies numbness, tingling or new weaknesses Behavioral/Psych: Mood is stable, no new changes  Extremities: No lower extremity edema Breast: denies any pain or lumps or nodules in either breasts All other systems were reviewed with the patient and are negative.  I have reviewed the past medical history, past surgical history, social history and family history with the patient and they are unchanged from previous note.  ALLERGIES:  is allergic to eggs or egg-derived products; other; and penicillins.  MEDICATIONS:  Current Outpatient Medications  Medication Sig Dispense Refill  . acyclovir (ZOVIRAX) 400 MG tablet Take 400 mg by mouth 2 (two) times daily.     . cetirizine (ZYRTEC) 10 MG tablet Take 10 mg by mouth daily.    . Cholecalciferol (VITAMIN D) 2000 UNITS tablet Take 2,000 Units by mouth daily.    Marland Kitchen dexamethasone (DECADRON) 4 MG tablet Take 1 tablet (4 mg total) by mouth 2 (two) times daily. Take 1 tablet day before  chemo and 1 tablet day after chemo with food 8 tablet 0  . HYDROcodone-acetaminophen (NORCO) 5-325 MG tablet Take 1 tablet by mouth every 6 (six) hours as needed for moderate pain or severe pain. 30 tablet 0  . lidocaine-prilocaine (EMLA) cream Apply to affected area once 30 g 3  . metoprolol succinate (TOPROL-XL) 25 MG 24 hr tablet Take 25 mg by mouth daily.  3  . Multiple Vitamins-Minerals (MULTIVITAMIN WITH MINERALS) tablet Take 1 tablet by mouth daily.    . ondansetron (ZOFRAN) 8 MG tablet Take 1 tablet (8 mg total) by mouth 2 (two) times daily as needed for refractory nausea / vomiting. Start on day 3 after chemo. 30 tablet 1  . prochlorperazine (COMPAZINE) 10 MG tablet Take 1 tablet (10 mg total) by mouth every 6 (six) hours as needed (Nausea or vomiting). 30 tablet 1  . zolpidem (AMBIEN) 10 MG tablet Take 10 mg by mouth at bedtime as needed for sleep.     No current facility-administered medications for this visit.     PHYSICAL EXAMINATION: ECOG PERFORMANCE STATUS: 1 - Symptomatic but completely ambulatory  Vitals:   06/30/18 1120  BP: (!) 142/76  Pulse: 93  Resp: 17  Temp: 99.1 F (37.3 C)  SpO2: 100%   Filed Weights   06/30/18 1120  Weight: 133 lb 11.2 oz (60.6 kg)    Physical exam not done due to COVID-19 precautions  LABORATORY DATA:  I have reviewed the data as listed CMP Latest Ref Rng & Units 06/23/2018 06/26/2017 04/15/2017  Glucose 70 - 99 mg/dL 162(H) 86 108(H)  BUN 8 - 23 mg/dL '18 18 12  '$ Creatinine 0.44 - 1.00 mg/dL 0.81 1.03(H) 2.23(H)  Sodium 135 - 145 mmol/L 141 140 142  Potassium 3.5 - 5.1 mmol/L 3.9 3.7 3.6  Chloride 98 - 111 mmol/L 108 108 106  CO2 22 - 32 mmol/L '23 26 28  '$ Calcium 8.9 - 10.3 mg/dL 9.2 8.7(L) 9.3  Total Protein 6.5 - 8.1 g/dL 7.5 6.9 -  Total Bilirubin 0.3 - 1.2 mg/dL 0.7 0.9 -  Alkaline Phos 38 - 126 U/L 124 94 -  AST 15 - 41 U/L 27 20 -  ALT 0 - 44 U/L 30 13(L) -    Lab Results  Component Value Date   WBC 25.1 (H) 06/30/2018    HGB 11.2 (L) 06/30/2018   HCT 34.0 (L) 06/30/2018   MCV 79.1 (L) 06/30/2018   PLT 147 (L) 06/30/2018   NEUTROABS PENDING 06/30/2018    ASSESSMENT & PLAN:  Malignant neoplasm of upper-inner quadrant of left breast in female, estrogen receptor positive ([place]) 05/08/2018 lumpectomy Dalbert Batman): IDC with DCIS, 1.8cm, grade 2, ER+ (95%), PR-, HER2 negative (1+, IHC), Ki67 20%, clear margins, 4 SLN negative. Oncotype DX score 30: Distant recurrence risk at 9 years 19% Treatment plan: 1.  Adjuvant chemotherapy with Taxotere and Cytoxan x4 2.  Adjuvant radiation therapy 3.  Followed by adjuvant antiestrogen therapy ---------------------------------------------------------------------------------------------------------------------------------------------- Current treatment: Cycle 1 day 8 Taxotere and Cytoxan every 3 weeks Chemo  toxicities: 1.  Severe back pain: I gave a prescription for hydrocodone as well has instructed her to take Tylenol.  For the next treatment she will take Claritin every day 2.  Denied any nausea or vomiting.  Return to clinic in 2 weeks for cycle 2.  No orders of the defined types were placed in this encounter.  The patient has a good understanding of the overall plan. she agrees with it. she will call with any problems that may develop before the next visit here.  Nicholas Lose, MD 06/30/2018  Julious Oka Dorshimer am acting as scribe for Dr. Nicholas Lose.  I have reviewed the above documentation for accuracy and completeness, and I agree with the above.

## 2018-06-30 ENCOUNTER — Telehealth: Payer: Self-pay | Admitting: *Deleted

## 2018-06-30 ENCOUNTER — Inpatient Hospital Stay (HOSPITAL_BASED_OUTPATIENT_CLINIC_OR_DEPARTMENT_OTHER): Payer: Medicare HMO | Admitting: Hematology and Oncology

## 2018-06-30 ENCOUNTER — Encounter: Payer: Self-pay | Admitting: Hematology and Oncology

## 2018-06-30 ENCOUNTER — Inpatient Hospital Stay: Payer: Medicare HMO

## 2018-06-30 ENCOUNTER — Other Ambulatory Visit: Payer: Self-pay

## 2018-06-30 DIAGNOSIS — T380X5A Adverse effect of glucocorticoids and synthetic analogues, initial encounter: Secondary | ICD-10-CM | POA: Diagnosis not present

## 2018-06-30 DIAGNOSIS — Z95828 Presence of other vascular implants and grafts: Secondary | ICD-10-CM

## 2018-06-30 DIAGNOSIS — R739 Hyperglycemia, unspecified: Secondary | ICD-10-CM

## 2018-06-30 DIAGNOSIS — Z79899 Other long term (current) drug therapy: Secondary | ICD-10-CM | POA: Diagnosis not present

## 2018-06-30 DIAGNOSIS — C50212 Malignant neoplasm of upper-inner quadrant of left female breast: Secondary | ICD-10-CM

## 2018-06-30 DIAGNOSIS — R102 Pelvic and perineal pain: Secondary | ICD-10-CM | POA: Diagnosis not present

## 2018-06-30 DIAGNOSIS — Z170 Estrogen receptor positive status [ER+]: Secondary | ICD-10-CM | POA: Diagnosis not present

## 2018-06-30 DIAGNOSIS — Z880 Allergy status to penicillin: Secondary | ICD-10-CM | POA: Diagnosis not present

## 2018-06-30 DIAGNOSIS — M545 Low back pain: Secondary | ICD-10-CM

## 2018-06-30 DIAGNOSIS — Z5189 Encounter for other specified aftercare: Secondary | ICD-10-CM | POA: Diagnosis not present

## 2018-06-30 DIAGNOSIS — Z5111 Encounter for antineoplastic chemotherapy: Secondary | ICD-10-CM | POA: Diagnosis not present

## 2018-06-30 DIAGNOSIS — R51 Headache: Secondary | ICD-10-CM

## 2018-06-30 LAB — CBC WITH DIFFERENTIAL (CANCER CENTER ONLY)
Abs Immature Granulocytes: 10.01 10*3/uL — ABNORMAL HIGH (ref 0.00–0.07)
Basophils Absolute: 0.1 10*3/uL (ref 0.0–0.1)
Basophils Relative: 0 %
Eosinophils Absolute: 0.3 10*3/uL (ref 0.0–0.5)
Eosinophils Relative: 1 %
HCT: 34 % — ABNORMAL LOW (ref 36.0–46.0)
Hemoglobin: 11.2 g/dL — ABNORMAL LOW (ref 12.0–15.0)
Immature Granulocytes: 41 %
Lymphocytes Relative: 14 %
Lymphs Abs: 3.6 10*3/uL (ref 0.7–4.0)
MCH: 26 pg (ref 26.0–34.0)
MCHC: 32.9 g/dL (ref 30.0–36.0)
MCV: 79.1 fL — ABNORMAL LOW (ref 80.0–100.0)
Monocytes Absolute: 3.2 10*3/uL — ABNORMAL HIGH (ref 0.1–1.0)
Monocytes Relative: 13 %
Neutro Abs: 7.9 10*3/uL — ABNORMAL HIGH (ref 1.7–7.7)
Neutrophils Relative %: 31 %
Platelet Count: 147 10*3/uL — ABNORMAL LOW (ref 150–400)
RBC: 4.3 MIL/uL (ref 3.87–5.11)
RDW: 13.2 % (ref 11.5–15.5)
WBC Count: 25.1 10*3/uL — ABNORMAL HIGH (ref 4.0–10.5)
nRBC: 0.3 % — ABNORMAL HIGH (ref 0.0–0.2)

## 2018-06-30 LAB — CMP (CANCER CENTER ONLY)
ALT: 36 U/L (ref 0–44)
AST: 43 U/L — ABNORMAL HIGH (ref 15–41)
Albumin: 3.2 g/dL — ABNORMAL LOW (ref 3.5–5.0)
Alkaline Phosphatase: 180 U/L — ABNORMAL HIGH (ref 38–126)
Anion gap: 11 (ref 5–15)
BUN: 9 mg/dL (ref 8–23)
CO2: 25 mmol/L (ref 22–32)
Calcium: 9.3 mg/dL (ref 8.9–10.3)
Chloride: 104 mmol/L (ref 98–111)
Creatinine: 0.78 mg/dL (ref 0.44–1.00)
GFR, Est AFR Am: >60 mL/min (ref >60)
GFR, Estimated: >60 mL/min (ref >60)
Glucose, Bld: 97 mg/dL (ref 70–99)
Potassium: 3.8 mmol/L (ref 3.5–5.1)
Sodium: 140 mmol/L (ref 135–145)
Total Bilirubin: 0.7 mg/dL (ref 0.3–1.2)
Total Protein: 6.8 g/dL (ref 6.5–8.1)

## 2018-06-30 MED ORDER — HEPARIN SOD (PORK) LOCK FLUSH 100 UNIT/ML IV SOLN
500.0000 [IU] | Freq: Once | INTRAVENOUS | Status: AC | PRN
  Administered 2018-06-30: 500 [IU]
  Filled 2018-06-30: qty 5

## 2018-06-30 MED ORDER — HYDROCODONE-ACETAMINOPHEN 5-325 MG PO TABS: 1.0000 | ORAL_TABLET | Freq: Four times a day (QID) | ORAL | 0 refills | Status: DC | PRN

## 2018-06-30 MED ORDER — SODIUM CHLORIDE 0.9% FLUSH
10.0000 mL | INTRAVENOUS | Status: DC | PRN
  Administered 2018-06-30: 11:00:00 10 mL
  Filled 2018-06-30: qty 10

## 2018-06-30 NOTE — Telephone Encounter (Signed)
Spoke to [name]. [name], feels better today. [name]. [name]. Received verbal understanding. Denies further questions or concerns.

## 2018-06-30 NOTE — Progress Notes (Signed)
Met with patient at lobby to introduce myself as Arboriculturist and to offer available resources.  Discussed one-time $1000 Radio broadcast assistant to assist with personal expenses while going through treatment. Based on verbal income, she does qualify for household of 1. Advised what to bring at next appointment. She verbalized understanding.  Gave her my card for any additional financial questions or concerns.

## 2018-07-08 NOTE — Assessment & Plan Note (Signed)
05/08/2018 lumpectomy Dalbert Batman): IDC with DCIS, 1.8cm, grade 2, ER+ (95%), PR-, HER2 negative (1+, IHC), Ki67 20%, clear margins, 4 SLN negative. Oncotype DX score 30: Distant recurrence risk at 9 years 19% Treatment plan: 1. Adjuvant chemotherapy with Taxotere and Cytoxan x4 2. Adjuvant radiation therapy 3. Followed by adjuvant antiestrogen therapy ---------------------------------------------------------------------------------------------------------------------------------------------- Current treatment: Cycle 2 Taxotere and Cytoxan every 3 weeks Chemo toxicities: 1.  Severe back pain: I gave a prescription for hydrocodone as well has instructed her to take Tylenol.  For the next treatment she will take Claritin every day 2.  Denied any nausea or vomiting.  Return to clinic in 3 weeks for cycle 3.

## 2018-07-10 ENCOUNTER — Telehealth: Payer: Self-pay | Admitting: Hematology and Oncology

## 2018-07-10 NOTE — Telephone Encounter (Signed)
Added injection appointments 6/25 and 7/16 two days after chemo appointments. Not able to reach patient or leave message. Patient will get updated schedule 6/23.

## 2018-07-13 NOTE — Progress Notes (Signed)
Patient Care Team: Curlene Labrum, MD as PCP - General  DIAGNOSIS:    ICD-10-CM   1. Malignant neoplasm of upper-inner quadrant of left breast in female, estrogen receptor positive ([place])  C50.212    Z17.0     SUMMARY OF ONCOLOGIC HISTORY: Oncology History  Malignant neoplasm of upper-inner quadrant of left breast in female, estrogen receptor positive ([place])  03/24/2018 Initial Diagnosis   Screening detected left breast mass 8 mm upper inner quadrant biopsy revealed grade 2 IDC with DCIS ER 95%, PR 0%, Ki-67 20%, HER-2 -1+ by IHC, T1 BN 0 stage Ia clinical stage   05/06/2018 Surgery   Lumpectomy Dalbert Batman): IDC with DCIS, 1.8cm, grade 2, ER+ (95%), PR-, HER2 negative (1+, IHC), Ki67 20%, clear margins, 4 SLN negative.    05/18/2018 Oncotype testing   Oncotype DX recurrence score 30: risk of distant recurrence at 9 years is 19%. Chemo benefit is >15%.    06/23/2018 -  Chemotherapy   Adjuvant chemotherapy with dose dense Adriamycin and Cytoxan x4 followed by Taxol weekly x12     CHIEF COMPLIANT: Cycle 2 Taxotere and Cytoxan  INTERVAL HISTORY: Kimberly Mccormick is a 68 y.o. with above-mentioned history of left breast cancer treated with lumpectomy. She presents to the clinic today for cycle 2 of adjuvant chemotherapy with Taxotere and Cytoxan.   She has tolerated cycle 1 of chemo extremely well other than the low back pain.  Denies any nausea vomiting. Back pain after cycle 1 has resolved  REVIEW OF SYSTEMS:   Constitutional: Denies fevers, chills or abnormal weight loss Eyes: Denies blurriness of vision Ears, nose, mouth, throat, and face: Denies mucositis or sore throat Respiratory: Denies cough, dyspnea or wheezes Cardiovascular: Denies palpitation, chest discomfort Gastrointestinal: Denies nausea, heartburn or change in bowel habits Skin: Denies abnormal skin rashes Lymphatics: Denies new lymphadenopathy or easy bruising Neurological: Denies numbness, tingling or new weaknesses  Behavioral/Psych: Mood is stable, no new changes  Extremities: No lower extremity edema Breast: denies any pain or lumps or nodules in either breasts All other systems were reviewed with the patient and are negative.  I have reviewed the past medical history, past surgical history, social history and family history with the patient and they are unchanged from previous note.  ALLERGIES:  is allergic to eggs or egg-derived products; other; and penicillins.  MEDICATIONS:  Current Outpatient Medications  Medication Sig Dispense Refill  . acyclovir (ZOVIRAX) 400 MG tablet Take 400 mg by mouth 2 (two) times daily.     . cetirizine (ZYRTEC) 10 MG tablet Take 10 mg by mouth daily.    . Cholecalciferol (VITAMIN D) 2000 UNITS tablet Take 2,000 Units by mouth daily.    Marland Kitchen dexamethasone (DECADRON) 4 MG tablet Take 1 tablet (4 mg total) by mouth 2 (two) times daily. Take 1 tablet day before chemo and 1 tablet day after chemo with food 8 tablet 0  . HYDROcodone-acetaminophen (NORCO) 5-325 MG tablet Take 1 tablet by mouth every 6 (six) hours as needed for moderate pain or severe pain. 30 tablet 0  . lidocaine-prilocaine (EMLA) cream Apply to affected area once 30 g 3  . metoprolol succinate (TOPROL-XL) 25 MG 24 hr tablet Take 25 mg by mouth daily.  3  . Multiple Vitamins-Minerals (MULTIVITAMIN WITH MINERALS) tablet Take 1 tablet by mouth daily.    . ondansetron (ZOFRAN) 8 MG tablet Take 1 tablet (8 mg total) by mouth 2 (two) times daily as needed for refractory nausea / vomiting.  Start on day 3 after chemo. 30 tablet 1  . prochlorperazine (COMPAZINE) 10 MG tablet Take 1 tablet (10 mg total) by mouth every 6 (six) hours as needed (Nausea or vomiting). 30 tablet 1  . zolpidem (AMBIEN) 10 MG tablet Take 10 mg by mouth at bedtime as needed for sleep.     No current facility-administered medications for this visit.     PHYSICAL EXAMINATION: ECOG PERFORMANCE STATUS: 1 - Symptomatic but completely ambulatory   Vitals:   07/14/18 1107  BP: (!) 158/92  Pulse: 78  Resp: 18  Temp: 99.2 F (37.3 C)  SpO2: 100%   Filed Weights   07/14/18 1107  Weight: 132 lb 11.2 oz (60.2 kg)    GENERAL: alert, no distress and comfortable SKIN: skin color, texture, turgor are normal, no rashes or significant lesions EYES: normal, Conjunctiva are pink and non-injected, sclera clear OROPHARYNX: no exudate, no erythema and lips, buccal mucosa, and tongue normal  NECK: supple, thyroid normal size, non-tender, without nodularity LYMPH: no palpable lymphadenopathy in the cervical, axillary or inguinal LUNGS: clear to auscultation and percussion with normal breathing effort HEART: regular rate & rhythm and no murmurs and no lower extremity edema ABDOMEN: abdomen soft, non-tender and normal bowel sounds MUSCULOSKELETAL: no cyanosis of digits and no clubbing  NEURO: alert & oriented x 3 with fluent speech, no focal motor/sensory deficits EXTREMITIES: No lower extremity edema  LABORATORY DATA:  I have reviewed the data as listed CMP Latest Ref Rng & Units 06/30/2018 06/23/2018 06/26/2017  Glucose 70 - 99 mg/dL 97 162(H) 86  BUN 8 - 23 mg/dL '9 18 18  '$ Creatinine 0.44 - 1.00 mg/dL 0.78 0.81 1.03(H)  Sodium 135 - 145 mmol/L 140 141 140  Potassium 3.5 - 5.1 mmol/L 3.8 3.9 3.7  Chloride 98 - 111 mmol/L 104 108 108  CO2 22 - 32 mmol/L '25 23 26  '$ Calcium 8.9 - 10.3 mg/dL 9.3 9.2 8.7(L)  Total Protein 6.5 - 8.1 g/dL 6.8 7.5 6.9  Total Bilirubin 0.3 - 1.2 mg/dL 0.7 0.7 0.9  Alkaline Phos 38 - 126 U/L 180(H) 124 94  AST 15 - 41 U/L 43(H) 27 20  ALT 0 - 44 U/L 36 30 13(L)    Lab Results  Component Value Date   WBC 5.5 07/14/2018   HGB 11.1 (L) 07/14/2018   HCT 33.7 (L) 07/14/2018   MCV 79.7 (L) 07/14/2018   PLT 463 (H) 07/14/2018   NEUTROABS 3.8 07/14/2018    ASSESSMENT & PLAN:  Malignant neoplasm of upper-inner quadrant of left breast in female, estrogen receptor positive ([name]) 05/08/2018 lumpectomy Dalbert Batman): IDC  with DCIS, 1.8cm, grade 2, ER+ (95%), PR-, HER2 negative (1+, IHC), Ki67 20%, clear margins, 4 SLN negative. Oncotype DX score 30: Distant recurrence risk at 9 years 19% Treatment plan: 1. Adjuvant chemotherapy with Taxotere and Cytoxan x4 2. Adjuvant radiation therapy 3. Followed by adjuvant antiestrogen therapy ---------------------------------------------------------------------------------------------------------------------------------------------- Current treatment: Cycle 2 Taxotere and Cytoxan every 3 weeks Chemo toxicities: 1.  Severe back pain: I gave a prescription for hydrocodone as well has instructed her to take Tylenol.    she will take Claritin every day 2.  Denied any nausea or vomiting.  Return to clinic in 3 weeks for cycle 3.  No orders of the defined types were placed in this encounter.  The patient has a good understanding of the overall plan. she agrees with it. she will call with any problems that may develop before the next visit here.  Nicholas Lose, MD 07/14/2018  Julious Oka Dorshimer am acting as scribe for Dr. Nicholas Lose.  I have reviewed the above documentation for accuracy and completeness, and I agree with the above.

## 2018-07-14 ENCOUNTER — Inpatient Hospital Stay: Payer: Medicare HMO

## 2018-07-14 ENCOUNTER — Other Ambulatory Visit: Payer: Self-pay

## 2018-07-14 ENCOUNTER — Encounter: Payer: Self-pay | Admitting: *Deleted

## 2018-07-14 ENCOUNTER — Inpatient Hospital Stay (HOSPITAL_BASED_OUTPATIENT_CLINIC_OR_DEPARTMENT_OTHER): Payer: Medicare HMO | Admitting: Hematology and Oncology

## 2018-07-14 DIAGNOSIS — R51 Headache: Secondary | ICD-10-CM | POA: Diagnosis not present

## 2018-07-14 DIAGNOSIS — C50212 Malignant neoplasm of upper-inner quadrant of left female breast: Secondary | ICD-10-CM

## 2018-07-14 DIAGNOSIS — Z5189 Encounter for other specified aftercare: Secondary | ICD-10-CM | POA: Diagnosis not present

## 2018-07-14 DIAGNOSIS — R102 Pelvic and perineal pain: Secondary | ICD-10-CM

## 2018-07-14 DIAGNOSIS — Z5111 Encounter for antineoplastic chemotherapy: Secondary | ICD-10-CM

## 2018-07-14 DIAGNOSIS — Z170 Estrogen receptor positive status [ER+]: Secondary | ICD-10-CM

## 2018-07-14 DIAGNOSIS — Z95828 Presence of other vascular implants and grafts: Secondary | ICD-10-CM

## 2018-07-14 DIAGNOSIS — Z79899 Other long term (current) drug therapy: Secondary | ICD-10-CM | POA: Diagnosis not present

## 2018-07-14 DIAGNOSIS — Z880 Allergy status to penicillin: Secondary | ICD-10-CM

## 2018-07-14 DIAGNOSIS — T380X5A Adverse effect of glucocorticoids and synthetic analogues, initial encounter: Secondary | ICD-10-CM | POA: Diagnosis not present

## 2018-07-14 DIAGNOSIS — M545 Low back pain: Secondary | ICD-10-CM

## 2018-07-14 DIAGNOSIS — R739 Hyperglycemia, unspecified: Secondary | ICD-10-CM | POA: Diagnosis not present

## 2018-07-14 LAB — CBC WITH DIFFERENTIAL (CANCER CENTER ONLY)
Abs Immature Granulocytes: 0.06 10*3/uL (ref 0.00–0.07)
Basophils Absolute: 0 10*3/uL (ref 0.0–0.1)
Basophils Relative: 0 %
Eosinophils Absolute: 0 10*3/uL (ref 0.0–0.5)
Eosinophils Relative: 0 %
HCT: 33.7 % — ABNORMAL LOW (ref 36.0–46.0)
Hemoglobin: 11.1 g/dL — ABNORMAL LOW (ref 12.0–15.0)
Immature Granulocytes: 1 %
Lymphocytes Relative: 25 %
Lymphs Abs: 1.4 10*3/uL (ref 0.7–4.0)
MCH: 26.2 pg (ref 26.0–34.0)
MCHC: 32.9 g/dL (ref 30.0–36.0)
MCV: 79.7 fL — ABNORMAL LOW (ref 80.0–100.0)
Monocytes Absolute: 0.2 10*3/uL (ref 0.1–1.0)
Monocytes Relative: 4 %
Neutro Abs: 3.8 10*3/uL (ref 1.7–7.7)
Neutrophils Relative %: 70 %
Platelet Count: 463 10*3/uL — ABNORMAL HIGH (ref 150–400)
RBC: 4.23 MIL/uL (ref 3.87–5.11)
RDW: 14.5 % (ref 11.5–15.5)
WBC Count: 5.5 10*3/uL (ref 4.0–10.5)
nRBC: 0 % (ref 0.0–0.2)

## 2018-07-14 LAB — CMP (CANCER CENTER ONLY)
ALT: 32 U/L (ref 0–44)
AST: 23 U/L (ref 15–41)
Albumin: 3.8 g/dL (ref 3.5–5.0)
Alkaline Phosphatase: 126 U/L (ref 38–126)
Anion gap: 11 (ref 5–15)
BUN: 17 mg/dL (ref 8–23)
CO2: 23 mmol/L (ref 22–32)
Calcium: 9.5 mg/dL (ref 8.9–10.3)
Chloride: 105 mmol/L (ref 98–111)
Creatinine: 0.83 mg/dL (ref 0.44–1.00)
GFR, Est AFR Am: >60 mL/min (ref >60)
GFR, Estimated: >60 mL/min (ref >60)
Glucose, Bld: 141 mg/dL — ABNORMAL HIGH (ref 70–99)
Potassium: 3.8 mmol/L (ref 3.5–5.1)
Sodium: 139 mmol/L (ref 135–145)
Total Bilirubin: 0.7 mg/dL (ref 0.3–1.2)
Total Protein: 7.4 g/dL (ref 6.5–8.1)

## 2018-07-14 MED ORDER — DEXAMETHASONE SODIUM PHOSPHATE 10 MG/ML IJ SOLN
INTRAMUSCULAR | Status: AC
  Filled 2018-07-14: qty 1

## 2018-07-14 MED ORDER — PALONOSETRON HCL INJECTION 0.25 MG/5ML
0.2500 mg | Freq: Once | INTRAVENOUS | Status: AC
  Administered 2018-07-14: 0.25 mg via INTRAVENOUS

## 2018-07-14 MED ORDER — SODIUM CHLORIDE 0.9 % IV SOLN
65.0000 mg/m2 | Freq: Once | INTRAVENOUS | Status: AC
  Administered 2018-07-14: 110 mg via INTRAVENOUS
  Filled 2018-07-14: qty 11

## 2018-07-14 MED ORDER — SODIUM CHLORIDE 0.9 % IV SOLN
500.0000 mg/m2 | Freq: Once | INTRAVENOUS | Status: AC
  Administered 2018-07-14: 820 mg via INTRAVENOUS
  Filled 2018-07-14: qty 41

## 2018-07-14 MED ORDER — SODIUM CHLORIDE 0.9 % IV SOLN
Freq: Once | INTRAVENOUS | Status: AC
  Administered 2018-07-14: 12:00:00 via INTRAVENOUS
  Filled 2018-07-14: qty 250

## 2018-07-14 MED ORDER — PALONOSETRON HCL INJECTION 0.25 MG/5ML
INTRAVENOUS | Status: AC
  Filled 2018-07-14: qty 5

## 2018-07-14 MED ORDER — HEPARIN SOD (PORK) LOCK FLUSH 100 UNIT/ML IV SOLN
500.0000 [IU] | Freq: Once | INTRAVENOUS | Status: AC | PRN
  Administered 2018-07-14: 500 [IU]
  Filled 2018-07-14: qty 5

## 2018-07-14 MED ORDER — DEXAMETHASONE SODIUM PHOSPHATE 10 MG/ML IJ SOLN
10.0000 mg | Freq: Once | INTRAMUSCULAR | Status: AC
  Administered 2018-07-14: 10 mg via INTRAVENOUS

## 2018-07-14 MED ORDER — SODIUM CHLORIDE 0.9% FLUSH
10.0000 mL | INTRAVENOUS | Status: DC | PRN
  Administered 2018-07-14: 10 mL
  Filled 2018-07-14: qty 10

## 2018-07-14 NOTE — Patient Instructions (Signed)
[place] Discharge Instructions for Patients Receiving Chemotherapy  Today you received the following chemotherapy agents: Taxotere and Cytoxan   To help prevent nausea and vomiting after your treatment, we encourage you to take your nausea medication as directed.    If you develop nausea and vomiting that is not controlled by your nausea medication, call the clinic.   BELOW ARE SYMPTOMS THAT SHOULD BE REPORTED IMMEDIATELY:  *FEVER GREATER THAN 100.5 F  *CHILLS WITH OR WITHOUT FEVER  NAUSEA AND VOMITING THAT IS NOT CONTROLLED WITH YOUR NAUSEA MEDICATION  *UNUSUAL SHORTNESS OF BREATH  *UNUSUAL BRUISING OR BLEEDING  TENDERNESS IN MOUTH AND THROAT WITH OR WITHOUT PRESENCE OF ULCERS  *URINARY PROBLEMS  *BOWEL PROBLEMS  UNUSUAL RASH Items with * indicate a potential emergency and should be followed up as soon as possible.  Feel free to call the clinic should you have any questions or concerns. The clinic phone number is [phone].  Please show the [place] at check-in to the Emergency Department and triage nurse.  Coronavirus (COVID-19) Are you at risk?  Are you at risk for the Coronavirus (COVID-19)?  To be considered HIGH RISK for Coronavirus (COVID-19), you have to meet the following criteria:  . Traveled to [place], [place], [place], [place] or [place]; or in the [place] to [place], [place], [place], or [place]; and have fever, cough, and shortness of breath within the last 2 weeks of travel OR . Been in close contact with a person diagnosed with COVID-19 within the last 2 weeks and have fever, cough, and shortness of breath . IF YOU DO NOT MEET THESE CRITERIA, YOU ARE CONSIDERED LOW RISK FOR COVID-19.  What to do if you are HIGH RISK for COVID-19?  Marland Kitchen If you are having a medical emergency, call 911. . Seek medical care right away. Before you go to a doctor's office, urgent care or emergency department, call ahead and tell them  about your recent travel, contact with someone diagnosed with COVID-19, and your symptoms. You should receive instructions from your physician's office regarding next steps of care.  . When you arrive at healthcare provider, tell the healthcare staff immediately you have returned from visiting [place], [place], [place], [place] or [place]; or traveled in the [place] to [place], [place], [place], or [place]; in the last two weeks or you have been in close contact with a person diagnosed with COVID-19 in the last 2 weeks.   . Tell the health care staff about your symptoms: fever, cough and shortness of breath. . After you have been seen by a medical provider, you will be either: o Tested for (COVID-19) and discharged home on quarantine except to seek medical care if symptoms worsen, and asked to  - Stay home and avoid contact with others until you get your results (4-5 days)  - Avoid travel on public transportation if possible (such as bus, train, or airplane) or o Sent to the Emergency Department by EMS for evaluation, COVID-19 testing, and possible admission depending on your condition and test results.  What to do if you are LOW RISK for COVID-19?  Reduce your risk of any infection by using the same precautions used for avoiding the common cold or flu:  Marland Kitchen Wash your hands often with soap and warm water for at least 20 seconds.  If soap and water are not readily available, use an alcohol-based hand sanitizer with at least 60% alcohol.  Marland Kitchen  If coughing or sneezing, cover your mouth and nose by coughing or sneezing into the elbow areas of your shirt or coat, into a tissue or into your sleeve (not your hands). . Avoid shaking hands with others and consider head nods or verbal greetings only. . Avoid touching your eyes, nose, or mouth with unwashed hands.  . Avoid close contact with people who are sick. . Avoid places or events with large numbers of people in one location, like concerts or  sporting events. . Carefully consider travel plans you have or are making. . If you are planning any travel outside or inside the [place], visit the CDC's Travelers' Health webpage for the latest health notices. . If you have some symptoms but not all symptoms, continue to monitor at home and seek medical attention if your symptoms worsen. . If you are having a medical emergency, call 911.   [place] / e-Visit: [email or web address]         [facility]: [facility]: [phone]                   [facility]: [phone]

## 2018-07-16 ENCOUNTER — Other Ambulatory Visit: Payer: Self-pay

## 2018-07-16 ENCOUNTER — Encounter: Payer: Self-pay | Admitting: Hematology and Oncology

## 2018-07-16 ENCOUNTER — Inpatient Hospital Stay: Payer: Medicare HMO

## 2018-07-16 VITALS — BP 148/71 | HR 78 | Temp 98.2°F | Resp 18

## 2018-07-16 DIAGNOSIS — R51 Headache: Secondary | ICD-10-CM | POA: Diagnosis not present

## 2018-07-16 DIAGNOSIS — T380X5A Adverse effect of glucocorticoids and synthetic analogues, initial encounter: Secondary | ICD-10-CM | POA: Diagnosis not present

## 2018-07-16 DIAGNOSIS — Z5189 Encounter for other specified aftercare: Secondary | ICD-10-CM | POA: Diagnosis not present

## 2018-07-16 DIAGNOSIS — M545 Low back pain: Secondary | ICD-10-CM | POA: Diagnosis not present

## 2018-07-16 DIAGNOSIS — R102 Pelvic and perineal pain: Secondary | ICD-10-CM | POA: Diagnosis not present

## 2018-07-16 DIAGNOSIS — C50212 Malignant neoplasm of upper-inner quadrant of left female breast: Secondary | ICD-10-CM

## 2018-07-16 DIAGNOSIS — Z170 Estrogen receptor positive status [ER+]: Secondary | ICD-10-CM

## 2018-07-16 DIAGNOSIS — Z5111 Encounter for antineoplastic chemotherapy: Secondary | ICD-10-CM | POA: Diagnosis not present

## 2018-07-16 DIAGNOSIS — R739 Hyperglycemia, unspecified: Secondary | ICD-10-CM | POA: Diagnosis not present

## 2018-07-16 MED ORDER — PEGFILGRASTIM-CBQV 6 MG/0.6ML ~~LOC~~ SOSY
6.0000 mg | PREFILLED_SYRINGE | Freq: Once | SUBCUTANEOUS | Status: AC
  Administered 2018-07-16: 6 mg via SUBCUTANEOUS

## 2018-07-16 MED ORDER — PEGFILGRASTIM-CBQV 6 MG/0.6ML ~~LOC~~ SOSY
PREFILLED_SYRINGE | SUBCUTANEOUS | Status: AC
  Filled 2018-07-16: qty 0.6

## 2018-07-16 NOTE — Progress Notes (Signed)
Met with patient whom brought proof of income for [employer].  Patient approved for the one-time $1000 [place] Silverstein to assist with personal expenses while going through treatment. She has a copy of the approval letter as well as the expense sheet. Discussed in detail expense sheet and how they are covered. She verbalized understanding and received a gas card today.  She has my card for any additional financial questions or concerns.

## 2018-07-16 NOTE — Progress Notes (Signed)
Patient also received a gas card today from her [place].

## 2018-07-16 NOTE — Patient Instructions (Signed)
Pegfilgrastim injection  What is this medicine?  PEGFILGRASTIM (PEG fil gra stim) is a long-acting granulocyte colony-stimulating factor that stimulates the growth of neutrophils, a type of white blood cell important in the body's fight against infection. It is used to reduce the incidence of fever and infection in patients with certain types of cancer who are receiving chemotherapy that affects the bone marrow, and to increase survival after being exposed to high doses of radiation.  This medicine may be used for other purposes; ask your health care provider or pharmacist if you have questions.  COMMON BRAND NAME(S): Fulphila, Neulasta, UDENYCA  What should I tell my health care provider before I take this medicine?  They need to know if you have any of these conditions:  -kidney disease  -latex allergy  -ongoing radiation therapy  -sickle cell disease  -skin reactions to acrylic adhesives (On-Body Injector only)  -an unusual or allergic reaction to pegfilgrastim, filgrastim, other medicines, foods, dyes, or preservatives  -pregnant or trying to get pregnant  -breast-feeding  How should I use this medicine?  This medicine is for injection under the skin. If you get this medicine at home, you will be taught how to prepare and give the pre-filled syringe or how to use the On-body Injector. Refer to the patient Instructions for Use for detailed instructions. Use exactly as directed. Tell your healthcare provider immediately if you suspect that the On-body Injector may not have performed as intended or if you suspect the use of the On-body Injector resulted in a missed or partial dose.  It is important that you put your used needles and syringes in a special sharps container. Do not put them in a trash can. If you do not have a sharps container, call your pharmacist or healthcare provider to get one.  Talk to your pediatrician regarding the use of this medicine in children. While this drug may be prescribed for  selected conditions, precautions do apply.  Overdosage: If you think you have taken too much of this medicine contact a poison control center or emergency room at once.  NOTE: This medicine is only for you. Do not share this medicine with others.  What if I miss a dose?  It is important not to miss your dose. Call your doctor or health care professional if you miss your dose. If you miss a dose due to an On-body Injector failure or leakage, a new dose should be administered as soon as possible using a single prefilled syringe for manual use.  What may interact with this medicine?  Interactions have not been studied.  Give your health care provider a list of all the medicines, herbs, non-prescription drugs, or dietary supplements you use. Also tell them if you smoke, drink alcohol, or use illegal drugs. Some items may interact with your medicine.  This list may not describe all possible interactions. Give your health care provider a list of all the medicines, herbs, non-prescription drugs, or dietary supplements you use. Also tell them if you smoke, drink alcohol, or use illegal drugs. Some items may interact with your medicine.  What should I watch for while using this medicine?  You may need blood work done while you are taking this medicine.  If you are going to need a MRI, CT scan, or other procedure, tell your doctor that you are using this medicine (On-Body Injector only).  What side effects may I notice from receiving this medicine?  Side effects that you should report to   your doctor or health care professional as soon as possible:  -allergic reactions like skin rash, itching or hives, swelling of the face, lips, or tongue  -back pain  -dizziness  -fever  -pain, redness, or irritation at site where injected  -pinpoint red spots on the skin  -red or dark-brown urine  -shortness of breath or breathing problems  -stomach or side pain, or pain at the shoulder  -swelling  -tiredness  -trouble passing urine or  change in the amount of urine  Side effects that usually do not require medical attention (report to your doctor or health care professional if they continue or are bothersome):  -bone pain  -muscle pain  This list may not describe all possible side effects. Call your doctor for medical advice about side effects. You may report side effects to FDA at 1-800-FDA-1088.  Where should I keep my medicine?  Keep out of the reach of children.  If you are using this medicine at home, you will be instructed on how to store it. Throw away any unused medicine after the expiration date on the label.  NOTE: This sheet is a summary. It may not cover all possible information. If you have questions about this medicine, talk to your doctor, pharmacist, or health care provider.   2019 Elsevier/Gold Standard (2017-04-14 16:57:08)

## 2018-07-29 NOTE — Assessment & Plan Note (Signed)
05/08/2018 lumpectomy Kimberly Mccormick): IDC with DCIS, 1.8cm, grade 2, ER+ (95%), PR-, HER2 negative (1+, IHC), Ki67 20%, clear margins, 4 SLN negative. Oncotype DX score 30: Distant recurrence risk at 9 years 19% Treatment plan: 1. Adjuvant chemotherapy with Taxotere and Cytoxan x4 2. Adjuvant radiation therapy 3. Followed by adjuvant antiestrogen therapy ---------------------------------------------------------------------------------------------------------------------------------------------- Current treatment: Cycle 3 Taxotere and Cytoxan every 3 weeks Chemo toxicities: 1.Severe back pain: I gave a prescription for hydrocodone as well has instructed her to take Tylenol.   she will take Claritin every day 2.Denied any nausea or vomiting.  Return to clinic in 3 weeks for cycle 4.

## 2018-08-03 NOTE — Progress Notes (Signed)
Patient Care Team: Curlene Labrum, MD as PCP - General  DIAGNOSIS:    ICD-10-CM   1. Malignant neoplasm of upper-inner quadrant of left breast in female, estrogen receptor positive ([place])  C50.212    Z17.0     SUMMARY OF ONCOLOGIC HISTORY: Oncology History  Malignant neoplasm of upper-inner quadrant of left breast in female, estrogen receptor positive ([place])  03/24/2018 Initial Diagnosis   Screening detected left breast mass 8 mm upper inner quadrant biopsy revealed grade 2 IDC with DCIS ER 95%, PR 0%, Ki-67 20%, HER-2 -1+ by IHC, T1 BN 0 stage Ia clinical stage   05/06/2018 Surgery   Lumpectomy Dalbert Batman): IDC with DCIS, 1.8cm, grade 2, ER+ (95%), PR-, HER2 negative (1+, IHC), Ki67 20%, clear margins, 4 SLN negative.    05/18/2018 Oncotype testing   Oncotype DX recurrence score 30: risk of distant recurrence at 9 years is 19%. Chemo benefit is >15%.    06/23/2018 -  Chemotherapy   Adjuvant chemotherapy with dose dense Adriamycin and Cytoxan x4 followed by Taxol weekly x12     CHIEF COMPLIANT: Cycle 3 Taxotere and Cytoxan  INTERVAL HISTORY: Kimberly Mccormick is a 68 y.o. with above-mentioned history of left breast cancer treated with lumpectomy. She presents to the clinic todayfor cycle 3 ofadjuvant chemotherapy with Taxotere and Cytoxan.  REVIEW OF SYSTEMS:   Constitutional: Denies fevers, chills or abnormal weight loss Eyes: Denies blurriness of vision Ears, nose, mouth, throat, and face: Denies mucositis or sore throat Respiratory: Denies cough, dyspnea or wheezes Cardiovascular: Denies palpitation, chest discomfort Gastrointestinal: Denies nausea, heartburn or change in bowel habits Skin: Denies abnormal skin rashes Lymphatics: Denies new lymphadenopathy or easy bruising Neurological: Denies numbness, tingling or new weaknesses Behavioral/Psych: Mood is stable, no new changes  Extremities: No lower extremity edema Breast: denies any pain or lumps or nodules in either  breasts All other systems were reviewed with the patient and are negative.  I have reviewed the past medical history, past surgical history, social history and family history with the patient and they are unchanged from previous note.  ALLERGIES:  is allergic to eggs or egg-derived products; other; and penicillins.  MEDICATIONS:  Current Outpatient Medications  Medication Sig Dispense Refill  . acyclovir (ZOVIRAX) 400 MG tablet Take 400 mg by mouth 2 (two) times daily.     . cetirizine (ZYRTEC) 10 MG tablet Take 10 mg by mouth daily.    . Cholecalciferol (VITAMIN D) 2000 UNITS tablet Take 2,000 Units by mouth daily.    Marland Kitchen dexamethasone (DECADRON) 4 MG tablet Take 1 tablet (4 mg total) by mouth 2 (two) times daily. Take 1 tablet day before chemo and 1 tablet day after chemo with food 8 tablet 0  . HYDROcodone-acetaminophen (NORCO) 5-325 MG tablet Take 1 tablet by mouth every 6 (six) hours as needed for moderate pain or severe pain. 30 tablet 0  . lidocaine-prilocaine (EMLA) cream Apply to affected area once 30 g 3  . metoprolol succinate (TOPROL-XL) 25 MG 24 hr tablet Take 25 mg by mouth daily.  3  . Multiple Vitamins-Minerals (MULTIVITAMIN WITH MINERALS) tablet Take 1 tablet by mouth daily.    . ondansetron (ZOFRAN) 8 MG tablet Take 1 tablet (8 mg total) by mouth 2 (two) times daily as needed for refractory nausea / vomiting. Start on day 3 after chemo. 30 tablet 1  . prochlorperazine (COMPAZINE) 10 MG tablet Take 1 tablet (10 mg total) by mouth every 6 (six) hours as needed (Nausea  or vomiting). 30 tablet 1  . zolpidem (AMBIEN) 10 MG tablet Take 10 mg by mouth at bedtime as needed for sleep.     No current facility-administered medications for this visit.     PHYSICAL EXAMINATION: ECOG PERFORMANCE STATUS: 1 - Symptomatic but completely ambulatory  Vitals:   08/04/18 1138  BP: (!) 148/74  Pulse: 73  Resp: 18  Temp: 98.2 F (36.8 C)  SpO2: 100%   Filed Weights   08/04/18 1138   Weight: 131 lb 14.4 oz (59.8 kg)    GENERAL: alert, no distress and comfortable SKIN: skin color, texture, turgor are normal, no rashes or significant lesions EYES: normal, Conjunctiva are pink and non-injected, sclera clear OROPHARYNX: no exudate, no erythema and lips, buccal mucosa, and tongue normal  NECK: supple, thyroid normal size, non-tender, without nodularity LYMPH: no palpable lymphadenopathy in the cervical, axillary or inguinal LUNGS: clear to auscultation and percussion with normal breathing effort HEART: regular rate & rhythm and no murmurs and no lower extremity edema ABDOMEN: abdomen soft, non-tender and normal bowel sounds MUSCULOSKELETAL: no cyanosis of digits and no clubbing  NEURO: alert & oriented x 3 with fluent speech, no focal motor/sensory deficits EXTREMITIES: No lower extremity edema  LABORATORY DATA:  I have reviewed the data as listed CMP Latest Ref Rng & Units 07/14/2018 06/30/2018 06/23/2018  Glucose 70 - 99 mg/dL 141(H) 97 162(H)  BUN 8 - 23 mg/dL _0 Creatinine 0.44 - 1.00 mg/dL 0.83 0.78 0.81  Sodium 135 - 145 mmol/L 139 140 141  Potassium 3.5 - 5.1 mmol/L 3.8 3.8 3.9  Chloride 98 - 111 mmol/L 105 104 108  CO2 22 - 32 mmol/L _1 Calcium 8.9 - 10.3 mg/dL 9.5 9.3 9.2  Total Protein 6.5 - 8.1 g/dL 7.4 6.8 7.5  Total Bilirubin 0.3 - 1.2 mg/dL 0.7 0.7 0.7  Alkaline Phos 38 - 126 U/L 126 180(H) 124  AST 15 - 41 U/L 23 43(H) 27  ALT 0 - 44 U/L 32 36 30    Lab Results  Component Value Date   WBC 3.1 (L) 08/04/2018   HGB 10.2 (L) 08/04/2018   HCT 31.4 (L) 08/04/2018   MCV 80.9 08/04/2018   PLT 313 08/04/2018   NEUTROABS 2.0 08/04/2018    ASSESSMENT & PLAN:  Malignant neoplasm of upper-inner quadrant of left breast in female, estrogen receptor positive ([place]) 05/08/2018 lumpectomy Dalbert Batman): IDC with DCIS, 1.8cm, grade 2, ER+ (95%), PR-, HER2 negative (1+, IHC), Ki67 20%, clear margins, 4 SLN negative. Oncotype DX score 30: Distant recurrence  risk at 9 years 19% Treatment plan: 1. Adjuvant chemotherapy with Taxotere and Cytoxan x4 2. Adjuvant radiation therapy 3. Followed by adjuvant antiestrogen therapy ---------------------------------------------------------------------------------------------------------------------------------------------- Current treatment: Cycle 3 Taxotere and Cytoxan every 3 weeks Chemo toxicities: 1.Severe back pain: I gave a prescription for hydrocodone as well has instructed her to take Tylenol.   she will take Claritin every day 2.Denied any nausea or vomiting. 3.  She is debating about doing radiation in [place] or [place]. She will think about it and inform us.  Return to clinic in 3 weeks for cycle 4.  No orders of the defined types were placed in this encounter.  The patient has a good understanding of the overall plan. she agrees with it. she will call with any problems that may develop before the next visit here.  Nicholas Lose, MD 08/04/2018  Julious Oka Dorshimer am acting as scribe for Dr. Nicholas Lose.  I have reviewed the above documentation for accuracy and completeness, and I agree with the above.

## 2018-08-04 ENCOUNTER — Inpatient Hospital Stay: Payer: Medicare HMO | Attending: Hematology and Oncology

## 2018-08-04 ENCOUNTER — Encounter: Payer: Self-pay | Admitting: *Deleted

## 2018-08-04 ENCOUNTER — Inpatient Hospital Stay: Payer: Medicare HMO

## 2018-08-04 ENCOUNTER — Other Ambulatory Visit: Payer: Self-pay

## 2018-08-04 ENCOUNTER — Inpatient Hospital Stay (HOSPITAL_BASED_OUTPATIENT_CLINIC_OR_DEPARTMENT_OTHER): Payer: Medicare HMO | Admitting: Hematology and Oncology

## 2018-08-04 DIAGNOSIS — Z79899 Other long term (current) drug therapy: Secondary | ICD-10-CM | POA: Diagnosis not present

## 2018-08-04 DIAGNOSIS — Z5111 Encounter for antineoplastic chemotherapy: Secondary | ICD-10-CM | POA: Diagnosis not present

## 2018-08-04 DIAGNOSIS — M549 Dorsalgia, unspecified: Secondary | ICD-10-CM | POA: Diagnosis not present

## 2018-08-04 DIAGNOSIS — Z5189 Encounter for other specified aftercare: Secondary | ICD-10-CM | POA: Insufficient documentation

## 2018-08-04 DIAGNOSIS — Z170 Estrogen receptor positive status [ER+]: Secondary | ICD-10-CM

## 2018-08-04 DIAGNOSIS — C50212 Malignant neoplasm of upper-inner quadrant of left female breast: Secondary | ICD-10-CM | POA: Insufficient documentation

## 2018-08-04 DIAGNOSIS — Z880 Allergy status to penicillin: Secondary | ICD-10-CM | POA: Diagnosis not present

## 2018-08-04 DIAGNOSIS — Z95828 Presence of other vascular implants and grafts: Secondary | ICD-10-CM

## 2018-08-04 LAB — CMP (CANCER CENTER ONLY)
ALT: 23 U/L (ref 0–44)
AST: 26 U/L (ref 15–41)
Albumin: 3.6 g/dL (ref 3.5–5.0)
Alkaline Phosphatase: 107 U/L (ref 38–126)
Anion gap: 9 (ref 5–15)
BUN: 13 mg/dL (ref 8–23)
CO2: 23 mmol/L (ref 22–32)
Calcium: 9 mg/dL (ref 8.9–10.3)
Chloride: 109 mmol/L (ref 98–111)
Creatinine: 0.74 mg/dL (ref 0.44–1.00)
GFR, Est AFR Am: >60 mL/min (ref >60)
GFR, Estimated: >60 mL/min (ref >60)
Glucose, Bld: 160 mg/dL — ABNORMAL HIGH (ref 70–99)
Potassium: 3.8 mmol/L (ref 3.5–5.1)
Sodium: 141 mmol/L (ref 135–145)
Total Bilirubin: 0.7 mg/dL (ref 0.3–1.2)
Total Protein: 6.9 g/dL (ref 6.5–8.1)

## 2018-08-04 LAB — CBC WITH DIFFERENTIAL (CANCER CENTER ONLY)
Abs Immature Granulocytes: 0.01 10*3/uL (ref 0.00–0.07)
Basophils Absolute: 0 10*3/uL (ref 0.0–0.1)
Basophils Relative: 0 %
Eosinophils Absolute: 0 10*3/uL (ref 0.0–0.5)
Eosinophils Relative: 0 %
HCT: 31.4 % — ABNORMAL LOW (ref 36.0–46.0)
Hemoglobin: 10.2 g/dL — ABNORMAL LOW (ref 12.0–15.0)
Immature Granulocytes: 0 %
Lymphocytes Relative: 34 %
Lymphs Abs: 1.1 10*3/uL (ref 0.7–4.0)
MCH: 26.3 pg (ref 26.0–34.0)
MCHC: 32.5 g/dL (ref 30.0–36.0)
MCV: 80.9 fL (ref 80.0–100.0)
Monocytes Absolute: 0 10*3/uL — ABNORMAL LOW (ref 0.1–1.0)
Monocytes Relative: 1 %
Neutro Abs: 2 10*3/uL (ref 1.7–7.7)
Neutrophils Relative %: 65 %
Platelet Count: 313 10*3/uL (ref 150–400)
RBC: 3.88 MIL/uL (ref 3.87–5.11)
RDW: 15.9 % — ABNORMAL HIGH (ref 11.5–15.5)
WBC Count: 3.1 10*3/uL — ABNORMAL LOW (ref 4.0–10.5)
nRBC: 0 % (ref 0.0–0.2)

## 2018-08-04 MED ORDER — SODIUM CHLORIDE 0.9% FLUSH
10.0000 mL | INTRAVENOUS | Status: DC | PRN
  Administered 2018-08-04: 10 mL
  Filled 2018-08-04: qty 10

## 2018-08-04 MED ORDER — SODIUM CHLORIDE 0.9 % IV SOLN
Freq: Once | INTRAVENOUS | Status: AC
  Administered 2018-08-04: 12:00:00 via INTRAVENOUS
  Filled 2018-08-04: qty 250

## 2018-08-04 MED ORDER — DEXAMETHASONE 4 MG PO TABS: 4.0000 mg | ORAL_TABLET | Freq: Two times a day (BID) | ORAL | 0 refills | Status: DC

## 2018-08-04 MED ORDER — PALONOSETRON HCL INJECTION 0.25 MG/5ML
0.2500 mg | Freq: Once | INTRAVENOUS | Status: AC
  Administered 2018-08-04: 0.25 mg via INTRAVENOUS

## 2018-08-04 MED ORDER — SODIUM CHLORIDE 0.9 % IV SOLN
500.0000 mg/m2 | Freq: Once | INTRAVENOUS | Status: AC
  Administered 2018-08-04: 820 mg via INTRAVENOUS
  Filled 2018-08-04: qty 41

## 2018-08-04 MED ORDER — SODIUM CHLORIDE 0.9 % IV SOLN
65.0000 mg/m2 | Freq: Once | INTRAVENOUS | Status: AC
  Administered 2018-08-04: 110 mg via INTRAVENOUS
  Filled 2018-08-04: qty 11

## 2018-08-04 MED ORDER — DEXAMETHASONE SODIUM PHOSPHATE 10 MG/ML IJ SOLN
INTRAMUSCULAR | Status: AC
  Filled 2018-08-04: qty 1

## 2018-08-04 MED ORDER — HEPARIN SOD (PORK) LOCK FLUSH 100 UNIT/ML IV SOLN
500.0000 [IU] | Freq: Once | INTRAVENOUS | Status: AC | PRN
  Administered 2018-08-04: 500 [IU]
  Filled 2018-08-04: qty 5

## 2018-08-04 MED ORDER — PALONOSETRON HCL INJECTION 0.25 MG/5ML
INTRAVENOUS | Status: AC
  Filled 2018-08-04: qty 5

## 2018-08-04 MED ORDER — DEXAMETHASONE SODIUM PHOSPHATE 10 MG/ML IJ SOLN
10.0000 mg | Freq: Once | INTRAMUSCULAR | Status: AC
  Administered 2018-08-04: 13:00:00 10 mg via INTRAVENOUS

## 2018-08-04 NOTE — Patient Instructions (Signed)
[facility] Discharge Instructions for Patients Receiving Chemotherapy  Today you received the following chemotherapy agents Taxotere and Cytoxan  To help prevent nausea and vomiting after your treatment, we encourage you to take your nausea medication as directed.    If you develop nausea and vomiting that is not controlled by your nausea medication, call the clinic.   BELOW ARE SYMPTOMS THAT SHOULD BE REPORTED IMMEDIATELY:  *FEVER GREATER THAN 100.5 F  *CHILLS WITH OR WITHOUT FEVER  NAUSEA AND VOMITING THAT IS NOT CONTROLLED WITH YOUR NAUSEA MEDICATION  *UNUSUAL SHORTNESS OF BREATH  *UNUSUAL BRUISING OR BLEEDING  TENDERNESS IN MOUTH AND THROAT WITH OR WITHOUT PRESENCE OF ULCERS  *URINARY PROBLEMS  *BOWEL PROBLEMS  UNUSUAL RASH Items with * indicate a potential emergency and should be followed up as soon as possible.  Feel free to call the clinic should you have any questions or concerns. The clinic phone number is [phone].  Please show the CHEMO ALERT CARD at check-in to the Emergency Department and triage nurse.   

## 2018-08-06 ENCOUNTER — Other Ambulatory Visit: Payer: Self-pay

## 2018-08-06 ENCOUNTER — Inpatient Hospital Stay: Payer: Medicare HMO

## 2018-08-06 VITALS — BP 151/68 | HR 65 | Temp 98.7°F | Resp 18

## 2018-08-06 DIAGNOSIS — C50212 Malignant neoplasm of upper-inner quadrant of left female breast: Secondary | ICD-10-CM | POA: Diagnosis not present

## 2018-08-06 DIAGNOSIS — Z880 Allergy status to penicillin: Secondary | ICD-10-CM | POA: Diagnosis not present

## 2018-08-06 DIAGNOSIS — Z79899 Other long term (current) drug therapy: Secondary | ICD-10-CM | POA: Diagnosis not present

## 2018-08-06 DIAGNOSIS — Z170 Estrogen receptor positive status [ER+]: Secondary | ICD-10-CM | POA: Diagnosis not present

## 2018-08-06 DIAGNOSIS — Z5189 Encounter for other specified aftercare: Secondary | ICD-10-CM | POA: Diagnosis not present

## 2018-08-06 DIAGNOSIS — Z5111 Encounter for antineoplastic chemotherapy: Secondary | ICD-10-CM | POA: Diagnosis not present

## 2018-08-06 DIAGNOSIS — M549 Dorsalgia, unspecified: Secondary | ICD-10-CM | POA: Diagnosis not present

## 2018-08-06 MED ORDER — PEGFILGRASTIM-CBQV 6 MG/0.6ML ~~LOC~~ SOSY
PREFILLED_SYRINGE | SUBCUTANEOUS | Status: AC
  Filled 2018-08-06: qty 0.6

## 2018-08-06 MED ORDER — PEGFILGRASTIM-CBQV 6 MG/0.6ML ~~LOC~~ SOSY
6.0000 mg | PREFILLED_SYRINGE | Freq: Once | SUBCUTANEOUS | Status: AC
  Administered 2018-08-06: 6 mg via SUBCUTANEOUS

## 2018-08-06 NOTE — Patient Instructions (Signed)

## 2018-08-18 NOTE — Assessment & Plan Note (Signed)
05/08/2018 lumpectomy Dalbert Batman): IDC with DCIS, 1.8cm, grade 2, ER+ (95%), PR-, HER2 negative (1+, IHC), Ki67 20%, clear margins, 4 SLN negative. Oncotype DX score 30: Distant recurrence risk at 9 years 19% Treatment plan: 1. Adjuvant chemotherapy with Taxotere and Cytoxan x4 2. Adjuvant radiation therapy 3. Followed by adjuvant antiestrogen therapy ---------------------------------------------------------------------------------------------------------------------------------------------- Current treatment: Cycle4Taxotere and Cytoxan every 3 weeks Chemo toxicities: 1.Severe back pain: I gave a prescription for hydrocodone as well has instructed her to take Tylenol. she will take Claritin every day 2.Denied any nausea or vomiting. 3.  She is debating about doing radiation in [place] or [place]. She will think about it and inform us.  Return to clinic in3 weeks for cycle 5.

## 2018-08-24 NOTE — Progress Notes (Signed)
Patient Care Team: Curlene Labrum, MD as PCP - General  DIAGNOSIS:    ICD-10-CM   1. Malignant neoplasm of upper-inner quadrant of left breast in female, estrogen receptor positive ([place])  C50.212    Z17.0     SUMMARY OF ONCOLOGIC HISTORY: Oncology History  Malignant neoplasm of upper-inner quadrant of left breast in female, estrogen receptor positive ([place])  03/24/2018 Initial Diagnosis   Screening detected left breast mass 8 mm upper inner quadrant biopsy revealed grade 2 IDC with DCIS ER 95%, PR 0%, Ki-67 20%, HER-2 -1+ by IHC, T1 BN 0 stage Ia clinical stage   05/06/2018 Surgery   Lumpectomy Dalbert Batman): IDC with DCIS, 1.8cm, grade 2, ER+ (95%), PR-, HER2 negative (1+, IHC), Ki67 20%, clear margins, 4 SLN negative.    05/18/2018 Oncotype testing   Oncotype DX recurrence score 30: risk of distant recurrence at 9 years is 19%. Chemo benefit is >15%.    06/23/2018 -  Chemotherapy   Adjuvant chemotherapy with dose dense Adriamycin and Cytoxan x4 followed by Taxol weekly x12     CHIEF COMPLIANT: Cycle 4 Taxotere and Cytoxan  INTERVAL HISTORY: Kimberly Mccormick is a 68 y.o. with above-mentioned history of left breast cancer treated with lumpectomy. She presents to the clinic todayfor cycle4ofadjuvant chemotherapy with Taxotere and Cytoxan.  REVIEW OF SYSTEMS:   Constitutional: Denies fevers, chills or abnormal weight loss Eyes: Denies blurriness of vision Ears, nose, mouth, throat, and face: Denies mucositis or sore throat Respiratory: Denies cough, dyspnea or wheezes Cardiovascular: Denies palpitation, chest discomfort Gastrointestinal: Denies nausea, heartburn or change in bowel habits Skin: Denies abnormal skin rashes Lymphatics: Denies new lymphadenopathy or easy bruising Neurological: Denies numbness, tingling or new weaknesses Behavioral/Psych: Mood is stable, no new changes  Extremities: No lower extremity edema Breast: denies any pain or lumps or nodules in either  breasts All other systems were reviewed with the patient and are negative.  I have reviewed the past medical history, past surgical history, social history and family history with the patient and they are unchanged from previous note.  ALLERGIES:  is allergic to eggs or egg-derived products; other; and penicillins.  MEDICATIONS:  Current Outpatient Medications  Medication Sig Dispense Refill  . acyclovir (ZOVIRAX) 400 MG tablet Take 400 mg by mouth 2 (two) times daily.     . cetirizine (ZYRTEC) 10 MG tablet Take 10 mg by mouth daily.    . Cholecalciferol (VITAMIN D) 2000 UNITS tablet Take 2,000 Units by mouth daily.    Marland Kitchen dexamethasone (DECADRON) 4 MG tablet Take 1 tablet (4 mg total) by mouth 2 (two) times daily. Take 1 tablet day before chemo and 1 tablet day after chemo with food 2 tablet 0  . metoprolol succinate (TOPROL-XL) 25 MG 24 hr tablet Take 25 mg by mouth daily.  3  . Multiple Vitamins-Minerals (MULTIVITAMIN WITH MINERALS) tablet Take 1 tablet by mouth daily.    . ondansetron (ZOFRAN) 8 MG tablet Take 1 tablet (8 mg total) by mouth 2 (two) times daily as needed for refractory nausea / vomiting. Start on day 3 after chemo. 30 tablet 1  . prochlorperazine (COMPAZINE) 10 MG tablet Take 1 tablet (10 mg total) by mouth every 6 (six) hours as needed (Nausea or vomiting). 30 tablet 1  . zolpidem (AMBIEN) 10 MG tablet Take 10 mg by mouth at bedtime as needed for sleep.     No current facility-administered medications for this visit.    Facility-Administered Medications Ordered in Other  Visits  Medication Dose Route Frequency Provider Last Rate Last Dose  . sodium chloride flush (NS) 0.9 % injection 10 mL  10 mL Intracatheter PRN Nicholas Lose, MD   10 mL at 08/04/18 1518    PHYSICAL EXAMINATION: ECOG PERFORMANCE STATUS: 1 - Symptomatic but completely ambulatory  Vitals:   08/25/18 0925  BP: (!) 170/91  Pulse: 71  Resp: 18  Temp: 98.9 F (37.2 C)  SpO2: 100%   Filed Weights    08/25/18 0925  Weight: 128 lb 14.4 oz (58.5 kg)    GENERAL: alert, no distress and comfortable SKIN: skin color, texture, turgor are normal, no rashes or significant lesions EYES: normal, Conjunctiva are pink and non-injected, sclera clear OROPHARYNX: no exudate, no erythema and lips, buccal mucosa, and tongue normal  NECK: supple, thyroid normal size, non-tender, without nodularity LYMPH: no palpable lymphadenopathy in the cervical, axillary or inguinal LUNGS: clear to auscultation and percussion with normal breathing effort HEART: regular rate & rhythm and no murmurs and no lower extremity edema ABDOMEN: abdomen soft, non-tender and normal bowel sounds MUSCULOSKELETAL: no cyanosis of digits and no clubbing  NEURO: alert & oriented x 3 with fluent speech, no focal motor/sensory deficits EXTREMITIES: No lower extremity edema  LABORATORY DATA:  I have reviewed the data as listed CMP Latest Ref Rng & Units 08/04/2018 07/14/2018 06/30/2018  Glucose 70 - 99 mg/dL 160(H) 141(H) 97  BUN 8 - 23 mg/dL '13 17 9  '$ Creatinine 0.44 - 1.00 mg/dL 0.74 0.83 0.78  Sodium 135 - 145 mmol/L 141 139 140  Potassium 3.5 - 5.1 mmol/L 3.8 3.8 3.8  Chloride 98 - 111 mmol/L 109 105 104  CO2 22 - 32 mmol/L '23 23 25  '$ Calcium 8.9 - 10.3 mg/dL 9.0 9.5 9.3  Total Protein 6.5 - 8.1 g/dL 6.9 7.4 6.8  Total Bilirubin 0.3 - 1.2 mg/dL 0.7 0.7 0.7  Alkaline Phos 38 - 126 U/L 107 126 180(H)  AST 15 - 41 U/L 26 23 43(H)  ALT 0 - 44 U/L 23 32 36    Lab Results  Component Value Date   WBC 3.7 (L) 08/25/2018   HGB 10.1 (L) 08/25/2018   HCT 30.6 (L) 08/25/2018   MCV 80.1 08/25/2018   PLT 354 08/25/2018   NEUTROABS 2.7 08/25/2018    ASSESSMENT & PLAN:  Malignant neoplasm of upper-inner quadrant of left breast in female, estrogen receptor positive ([place]) 05/08/2018 lumpectomy Dalbert Batman): IDC with DCIS, 1.8cm, grade 2, ER+ (95%), PR-, HER2 negative (1+, IHC), Ki67 20%, clear margins, 4 SLN negative. Oncotype DX score 30:  Distant recurrence risk at 9 years 19% Treatment plan: 1. Adjuvant chemotherapy with Taxotere and Cytoxan x4 2. Adjuvant radiation therapy 3. Followed by adjuvant antiestrogen therapy ---------------------------------------------------------------------------------------------------------------------------------------------- Current treatment: Cycle4Taxotere and Cytoxan every 3 weeks Chemo toxicities: 1.Severe back pain: Resolved after taking Claritin 2.Denied any nausea or vomiting. Denies any neuropathy.  She has mild altered sensation of the tips of the fingers but it does not qualify as neuropathy yet.  She is debating about doing radiation in [place] or [place]. She will think about it and inform us.  I discussed with her that after radiation is complete she would need antiestrogen therapy and I gave her literature on anastrozole. She is not sure about taking the anastrozole.  She will think about it and pray about it as well. Return to clinic after radiation therapy is complete.   No orders of the defined types were placed in this encounter.  The  patient has a good understanding of the overall plan. she agrees with it. she will call with any problems that may develop before the next visit here.  Nicholas Lose, MD 08/25/2018  Julious Oka Dorshimer am acting as scribe for Dr. Nicholas Lose.  I have reviewed the above documentation for accuracy and completeness, and I agree with the above.

## 2018-08-25 ENCOUNTER — Inpatient Hospital Stay: Payer: Medicare HMO

## 2018-08-25 ENCOUNTER — Other Ambulatory Visit: Payer: Self-pay

## 2018-08-25 ENCOUNTER — Inpatient Hospital Stay (HOSPITAL_BASED_OUTPATIENT_CLINIC_OR_DEPARTMENT_OTHER): Payer: Medicare HMO | Admitting: Hematology and Oncology

## 2018-08-25 ENCOUNTER — Inpatient Hospital Stay: Payer: Medicare HMO | Attending: Hematology and Oncology

## 2018-08-25 DIAGNOSIS — Z5111 Encounter for antineoplastic chemotherapy: Secondary | ICD-10-CM | POA: Insufficient documentation

## 2018-08-25 DIAGNOSIS — C50212 Malignant neoplasm of upper-inner quadrant of left female breast: Secondary | ICD-10-CM

## 2018-08-25 DIAGNOSIS — Z170 Estrogen receptor positive status [ER+]: Secondary | ICD-10-CM

## 2018-08-25 DIAGNOSIS — Z9221 Personal history of antineoplastic chemotherapy: Secondary | ICD-10-CM | POA: Insufficient documentation

## 2018-08-25 DIAGNOSIS — Z923 Personal history of irradiation: Secondary | ICD-10-CM | POA: Diagnosis not present

## 2018-08-25 DIAGNOSIS — Z79899 Other long term (current) drug therapy: Secondary | ICD-10-CM | POA: Diagnosis not present

## 2018-08-25 DIAGNOSIS — Z95828 Presence of other vascular implants and grafts: Secondary | ICD-10-CM

## 2018-08-25 LAB — CBC WITH DIFFERENTIAL (CANCER CENTER ONLY)
Abs Immature Granulocytes: 0.02 10*3/uL (ref 0.00–0.07)
Basophils Absolute: 0 10*3/uL (ref 0.0–0.1)
Basophils Relative: 0 %
Eosinophils Absolute: 0 10*3/uL (ref 0.0–0.5)
Eosinophils Relative: 0 %
HCT: 30.6 % — ABNORMAL LOW (ref 36.0–46.0)
Hemoglobin: 10.1 g/dL — ABNORMAL LOW (ref 12.0–15.0)
Immature Granulocytes: 1 %
Lymphocytes Relative: 26 %
Lymphs Abs: 1 10*3/uL (ref 0.7–4.0)
MCH: 26.4 pg (ref 26.0–34.0)
MCHC: 33 g/dL (ref 30.0–36.0)
MCV: 80.1 fL (ref 80.0–100.0)
Monocytes Absolute: 0.1 10*3/uL (ref 0.1–1.0)
Monocytes Relative: 1 %
Neutro Abs: 2.7 10*3/uL (ref 1.7–7.7)
Neutrophils Relative %: 72 %
Platelet Count: 354 10*3/uL (ref 150–400)
RBC: 3.82 MIL/uL — ABNORMAL LOW (ref 3.87–5.11)
RDW: 16.2 % — ABNORMAL HIGH (ref 11.5–15.5)
WBC Count: 3.7 10*3/uL — ABNORMAL LOW (ref 4.0–10.5)
nRBC: 0 % (ref 0.0–0.2)

## 2018-08-25 LAB — CMP (CANCER CENTER ONLY)
ALT: 15 U/L (ref 0–44)
AST: 18 U/L (ref 15–41)
Albumin: 3.5 g/dL (ref 3.5–5.0)
Alkaline Phosphatase: 117 U/L (ref 38–126)
Anion gap: 9 (ref 5–15)
BUN: 8 mg/dL (ref 8–23)
CO2: 24 mmol/L (ref 22–32)
Calcium: 9.3 mg/dL (ref 8.9–10.3)
Chloride: 107 mmol/L (ref 98–111)
Creatinine: 0.73 mg/dL (ref 0.44–1.00)
GFR, Est AFR Am: >60 mL/min (ref >60)
GFR, Estimated: >60 mL/min (ref >60)
Glucose, Bld: 142 mg/dL — ABNORMAL HIGH (ref 70–99)
Potassium: 3.8 mmol/L (ref 3.5–5.1)
Sodium: 140 mmol/L (ref 135–145)
Total Bilirubin: 0.7 mg/dL (ref 0.3–1.2)
Total Protein: 7.2 g/dL (ref 6.5–8.1)

## 2018-08-25 MED ORDER — SODIUM CHLORIDE 0.9% FLUSH
10.0000 mL | INTRAVENOUS | Status: DC | PRN
  Administered 2018-08-25: 09:00:00 10 mL
  Filled 2018-08-25: qty 10

## 2018-08-25 MED ORDER — SODIUM CHLORIDE 0.9 % IV SOLN
65.0000 mg/m2 | Freq: Once | INTRAVENOUS | Status: AC
  Administered 2018-08-25: 110 mg via INTRAVENOUS
  Filled 2018-08-25: qty 11

## 2018-08-25 MED ORDER — SODIUM CHLORIDE 0.9 % IV SOLN
Freq: Once | INTRAVENOUS | Status: AC
  Administered 2018-08-25: 10:00:00 via INTRAVENOUS
  Filled 2018-08-25: qty 250

## 2018-08-25 MED ORDER — HEPARIN SOD (PORK) LOCK FLUSH 100 UNIT/ML IV SOLN
500.0000 [IU] | Freq: Once | INTRAVENOUS | Status: AC | PRN
  Administered 2018-08-25: 500 [IU]
  Filled 2018-08-25: qty 5

## 2018-08-25 MED ORDER — DEXAMETHASONE SODIUM PHOSPHATE 10 MG/ML IJ SOLN
INTRAMUSCULAR | Status: AC
  Filled 2018-08-25: qty 1

## 2018-08-25 MED ORDER — PALONOSETRON HCL INJECTION 0.25 MG/5ML
0.2500 mg | Freq: Once | INTRAVENOUS | Status: AC
  Administered 2018-08-25: 0.25 mg via INTRAVENOUS

## 2018-08-25 MED ORDER — PALONOSETRON HCL INJECTION 0.25 MG/5ML
INTRAVENOUS | Status: AC
  Filled 2018-08-25: qty 5

## 2018-08-25 MED ORDER — SODIUM CHLORIDE 0.9 % IV SOLN
500.0000 mg/m2 | Freq: Once | INTRAVENOUS | Status: AC
  Administered 2018-08-25: 820 mg via INTRAVENOUS
  Filled 2018-08-25: qty 41

## 2018-08-25 MED ORDER — DEXAMETHASONE SODIUM PHOSPHATE 10 MG/ML IJ SOLN
10.0000 mg | Freq: Once | INTRAMUSCULAR | Status: AC
  Administered 2018-08-25: 10 mg via INTRAVENOUS

## 2018-08-25 MED ORDER — SODIUM CHLORIDE 0.9% FLUSH
10.0000 mL | INTRAVENOUS | Status: DC | PRN
  Administered 2018-08-25: 10 mL
  Filled 2018-08-25: qty 10

## 2018-08-25 NOTE — Patient Instructions (Signed)
[facility] Discharge Instructions for Patients Receiving Chemotherapy  Today you received the following chemotherapy agents Taxotere and Cytoxan  To help prevent nausea and vomiting after your treatment, we encourage you to take your nausea medication as directed.    If you develop nausea and vomiting that is not controlled by your nausea medication, call the clinic.   BELOW ARE SYMPTOMS THAT SHOULD BE REPORTED IMMEDIATELY:  *FEVER GREATER THAN 100.5 F  *CHILLS WITH OR WITHOUT FEVER  NAUSEA AND VOMITING THAT IS NOT CONTROLLED WITH YOUR NAUSEA MEDICATION  *UNUSUAL SHORTNESS OF BREATH  *UNUSUAL BRUISING OR BLEEDING  TENDERNESS IN MOUTH AND THROAT WITH OR WITHOUT PRESENCE OF ULCERS  *URINARY PROBLEMS  *BOWEL PROBLEMS  UNUSUAL RASH Items with * indicate a potential emergency and should be followed up as soon as possible.  Feel free to call the clinic should you have any questions or concerns. The clinic phone number is [phone].  Please show the CHEMO ALERT CARD at check-in to the Emergency Department and triage nurse.   

## 2018-08-26 ENCOUNTER — Telehealth: Payer: Self-pay | Admitting: *Deleted

## 2018-08-26 ENCOUNTER — Inpatient Hospital Stay: Payer: Medicare HMO

## 2018-08-26 NOTE — Telephone Encounter (Signed)
Called pt to congratulate on completing chemo on 08/25/18. Relate doing well, no complaints of symptoms. Pt is still thinking on the location of xrt. Request pt to call when she had made a decision. Received verbal understanding.

## 2018-08-26 NOTE — Progress Notes (Signed)
Nutrition Assessment   Reason for Assessment:  Patient identified on Malnutrition Screening tool for weight loss and poor appetite.    ASSESSMENT:  68 year old female with left breast cancer. Patient s/p lumpectomy, oncotype and adjuvant chemotherapy (completed on 8/4) Past medical history of HTN.  Spoke with patient via phone this afternoon.  Introduced Sports coach and service at [employer].  Patient reports that she has a poor appetite, taste alterations (metallic, bitter).  Reports that she ate 2 boiled eggs and toast this am and nothing else today.  Reports that [employer] chicken noodle soup taste good to her but she is pouring off the broth.  Reports that she has some glucerna but does not drink it everyday.  Reports that she has been sucking on ginger drops which has helped improve the taste in her mouth.        Nutrition Focused Physical Exam: deferred   Medications: Vit D, MVI, zofran, compazine   Labs: glucose 142   Anthropometrics:   Height: 63 inches Weight: 128 lb 14.4 oz on 8/4  UBW: 138 lb per patient last at that weight in March 2020 BMI: 22  7% weight loss in 4 months   Estimated Energy Needs  Kcals: 1700-2000 Protein: 85-100 g Fluid: 2 L   NUTRITION DIAGNOSIS: Inadequate oral intake related to cancer related treatment side effects (taste alterations) as evidenced by 7% weight loss in 4 months   INTERVENTION:  Discussed strategies to help with taste change Encouraged high calories, high protein foods and examples provided.  Encouraged patient to drink oral nutrition shake 1-2 times per day. Contact information provided   MONITORING, EVALUATION, GOAL: Patient will consume adequate calories to prevent weight from decreasing   Next Visit: phone f/u Wednesday August 26  Alter Moss B. Zenia Resides, [place], [place] Registered Dietitian [phone] (pager)

## 2018-08-27 ENCOUNTER — Other Ambulatory Visit: Payer: Self-pay

## 2018-08-27 ENCOUNTER — Inpatient Hospital Stay: Payer: Medicare HMO

## 2018-08-27 VITALS — BP 167/70 | HR 66 | Temp 98.7°F | Resp 18

## 2018-08-27 DIAGNOSIS — Z9221 Personal history of antineoplastic chemotherapy: Secondary | ICD-10-CM | POA: Diagnosis not present

## 2018-08-27 DIAGNOSIS — Z170 Estrogen receptor positive status [ER+]: Secondary | ICD-10-CM | POA: Diagnosis not present

## 2018-08-27 DIAGNOSIS — Z79899 Other long term (current) drug therapy: Secondary | ICD-10-CM | POA: Diagnosis not present

## 2018-08-27 DIAGNOSIS — C50212 Malignant neoplasm of upper-inner quadrant of left female breast: Secondary | ICD-10-CM | POA: Diagnosis not present

## 2018-08-27 DIAGNOSIS — Z923 Personal history of irradiation: Secondary | ICD-10-CM | POA: Diagnosis not present

## 2018-08-27 DIAGNOSIS — Z5111 Encounter for antineoplastic chemotherapy: Secondary | ICD-10-CM | POA: Diagnosis not present

## 2018-08-27 MED ORDER — PEGFILGRASTIM-CBQV 6 MG/0.6ML ~~LOC~~ SOSY
6.0000 mg | PREFILLED_SYRINGE | Freq: Once | SUBCUTANEOUS | Status: AC
  Administered 2018-08-27: 6 mg via SUBCUTANEOUS

## 2018-08-27 MED ORDER — PEGFILGRASTIM-CBQV 6 MG/0.6ML ~~LOC~~ SOSY
PREFILLED_SYRINGE | SUBCUTANEOUS | Status: AC
  Filled 2018-08-27: qty 0.6

## 2018-08-27 NOTE — Patient Instructions (Signed)

## 2018-09-01 ENCOUNTER — Telehealth: Payer: Self-pay | Admitting: *Deleted

## 2018-09-01 NOTE — Telephone Encounter (Signed)
Received call from pt who was very tearful.  Pt stating she had not been eating or drinking much since Thursday (08/27/2018).  Pt feels she is dehydrated and weak.  Pt denies any fever or vomiting.  RN offered to schedule pt and apt tomorrow 09/02/2018 with [facility].  Pt states she has to talk with her son tonight and set up transportation.  RN will call pt in AM to schedule apt. Sandi Mealy, PA notified and okay with scheduling apt.

## 2018-09-02 ENCOUNTER — Telehealth: Payer: Self-pay | Admitting: *Deleted

## 2018-09-02 NOTE — Telephone Encounter (Signed)
RN placed call to pt to follow up on conversation yesterday.  Pt states she was able to drink more and eat last night.  Pt states she does not want to come into the office at this time to be evaluated. Pt denies dizziness and fatigue.  RN educated pt maintaining hydration with water and gatorade.  RN also educated pt on eating bland/simple foods such as chicken noodle soup, saltines, mashed potatoes, rice, and chicken.  Pt verbalized understanding and stated she will call the office with any decline in health and nutrition status.

## 2018-09-07 ENCOUNTER — Other Ambulatory Visit: Payer: Self-pay

## 2018-09-07 ENCOUNTER — Telehealth: Payer: Self-pay

## 2018-09-07 DIAGNOSIS — J329 Chronic sinusitis, unspecified: Secondary | ICD-10-CM | POA: Diagnosis not present

## 2018-09-07 DIAGNOSIS — J209 Acute bronchitis, unspecified: Secondary | ICD-10-CM | POA: Diagnosis not present

## 2018-09-07 DIAGNOSIS — Z20822 Contact with and (suspected) exposure to covid-19: Secondary | ICD-10-CM

## 2018-09-07 DIAGNOSIS — C50912 Malignant neoplasm of unspecified site of left female breast: Secondary | ICD-10-CM | POA: Diagnosis not present

## 2018-09-07 NOTE — Telephone Encounter (Signed)
RN spoke with patient regarding cough. Pt reports cough X 2-3 days. Denies any fever, shortness of breath, or chest pain.  Pt reports clear mucus.    RN encouraged fluids, and educated to monitor for changes in fever, and discoloration of mucus.  Pt with OTC cough medication, Dayquil.  RN notified okay for use. Pt voiced understanding. Pt will call back if any changes.

## 2018-09-09 LAB — NOVEL CORONAVIRUS, NAA: SARS-CoV-2, NAA: NOT DETECTED

## 2018-09-15 ENCOUNTER — Telehealth: Payer: Self-pay

## 2018-09-15 NOTE — Telephone Encounter (Signed)
Contacted patient to verify telephone visit for pre reg °

## 2018-09-16 ENCOUNTER — Inpatient Hospital Stay: Payer: Medicare HMO

## 2018-09-16 NOTE — Progress Notes (Signed)
Nutrition Follow-up:  Patient with left breast cancer.  Patient s/p lumpectomy, oncotype and adjuvant chemotherapy (completed on 8/4).  Spoke with patient this afternoon via phone for nutrition follow-up.  Patient reports that she thinks her appetite is a little bit better.  Reports that she feels better but still has lingering cough.  Reports PCP prescribed steriods.  Patient reports that she eats cereal or sometimes eggs or pancakes for breakfast, chicken salad sandwich for lunch and supper meat and vegetables.  Reports that "milky" oral nutrition supplements give her diarrhea.  Still has taste alterations.  Denies nausea.     Medications: reviewed  Labs: reviewed  Anthropometrics:   No new weight.  Patient unsure of weight   NUTRITION DIAGNOSIS: Inadequate oral intake continues   INTERVENTION:  Reviewed strategies to help with taste change. Discussed lactose free/dairy free oral nutrition supplements Encouraged high calorie, high protein foods to prevent weight loss.    MONITORING, EVALUATION, GOAL: Patient will consume adequate calories to prevent weight loss   NEXT VISIT: phone f/u Wednesday, Sept 30th  Kimberly Mccormick B. Zenia Resides, [place], [place] Registered Dietitian [phone] (pager)

## 2018-09-18 ENCOUNTER — Telehealth: Payer: Self-pay | Admitting: *Deleted

## 2018-09-18 NOTE — Telephone Encounter (Signed)
Referral faxed to [place] - attn: Dr. Francesca Jewett - Release NW:[phone]

## 2018-09-18 NOTE — Telephone Encounter (Signed)
Spoke pt regarding location of xrt. Pt wishes to receive XRT in [place]. Informed pt referral will be sent and she will receive a call with appt. Received verbal understanding.

## 2018-09-29 DIAGNOSIS — C50912 Malignant neoplasm of unspecified site of left female breast: Secondary | ICD-10-CM | POA: Diagnosis not present

## 2018-09-29 DIAGNOSIS — C50212 Malignant neoplasm of upper-inner quadrant of left female breast: Secondary | ICD-10-CM | POA: Diagnosis not present

## 2018-09-29 DIAGNOSIS — Z170 Estrogen receptor positive status [ER+]: Secondary | ICD-10-CM | POA: Diagnosis not present

## 2018-10-01 DIAGNOSIS — Z170 Estrogen receptor positive status [ER+]: Secondary | ICD-10-CM | POA: Diagnosis not present

## 2018-10-01 DIAGNOSIS — C50912 Malignant neoplasm of unspecified site of left female breast: Secondary | ICD-10-CM | POA: Diagnosis not present

## 2018-10-01 DIAGNOSIS — C50212 Malignant neoplasm of upper-inner quadrant of left female breast: Secondary | ICD-10-CM | POA: Diagnosis not present

## 2018-10-08 DIAGNOSIS — C50212 Malignant neoplasm of upper-inner quadrant of left female breast: Secondary | ICD-10-CM | POA: Diagnosis not present

## 2018-10-08 DIAGNOSIS — C50912 Malignant neoplasm of unspecified site of left female breast: Secondary | ICD-10-CM | POA: Diagnosis not present

## 2018-10-08 DIAGNOSIS — Z170 Estrogen receptor positive status [ER+]: Secondary | ICD-10-CM | POA: Diagnosis not present

## 2018-10-09 DIAGNOSIS — E782 Mixed hyperlipidemia: Secondary | ICD-10-CM | POA: Diagnosis not present

## 2018-10-09 DIAGNOSIS — K219 Gastro-esophageal reflux disease without esophagitis: Secondary | ICD-10-CM | POA: Diagnosis not present

## 2018-10-09 DIAGNOSIS — N179 Acute kidney failure, unspecified: Secondary | ICD-10-CM | POA: Diagnosis not present

## 2018-10-09 DIAGNOSIS — I10 Essential (primary) hypertension: Secondary | ICD-10-CM | POA: Diagnosis not present

## 2018-10-13 DIAGNOSIS — E559 Vitamin D deficiency, unspecified: Secondary | ICD-10-CM | POA: Diagnosis not present

## 2018-10-13 DIAGNOSIS — Z0001 Encounter for general adult medical examination with abnormal findings: Secondary | ICD-10-CM | POA: Diagnosis not present

## 2018-10-13 DIAGNOSIS — I10 Essential (primary) hypertension: Secondary | ICD-10-CM | POA: Diagnosis not present

## 2018-10-13 DIAGNOSIS — K219 Gastro-esophageal reflux disease without esophagitis: Secondary | ICD-10-CM | POA: Diagnosis not present

## 2018-10-13 DIAGNOSIS — C50912 Malignant neoplasm of unspecified site of left female breast: Secondary | ICD-10-CM | POA: Diagnosis not present

## 2018-10-13 DIAGNOSIS — Z6822 Body mass index (BMI) 22.0-22.9, adult: Secondary | ICD-10-CM | POA: Diagnosis not present

## 2018-10-13 DIAGNOSIS — E782 Mixed hyperlipidemia: Secondary | ICD-10-CM | POA: Diagnosis not present

## 2018-10-13 DIAGNOSIS — D509 Iron deficiency anemia, unspecified: Secondary | ICD-10-CM | POA: Diagnosis not present

## 2018-10-16 DIAGNOSIS — C50212 Malignant neoplasm of upper-inner quadrant of left female breast: Secondary | ICD-10-CM | POA: Diagnosis not present

## 2018-10-16 DIAGNOSIS — C50912 Malignant neoplasm of unspecified site of left female breast: Secondary | ICD-10-CM | POA: Diagnosis not present

## 2018-10-16 DIAGNOSIS — Z170 Estrogen receptor positive status [ER+]: Secondary | ICD-10-CM | POA: Diagnosis not present

## 2018-10-19 DIAGNOSIS — Z170 Estrogen receptor positive status [ER+]: Secondary | ICD-10-CM | POA: Diagnosis not present

## 2018-10-19 DIAGNOSIS — C50212 Malignant neoplasm of upper-inner quadrant of left female breast: Secondary | ICD-10-CM | POA: Diagnosis not present

## 2018-10-19 DIAGNOSIS — C50912 Malignant neoplasm of unspecified site of left female breast: Secondary | ICD-10-CM | POA: Diagnosis not present

## 2018-10-20 ENCOUNTER — Telehealth: Payer: Self-pay | Admitting: Nutrition

## 2018-10-20 DIAGNOSIS — Z170 Estrogen receptor positive status [ER+]: Secondary | ICD-10-CM | POA: Diagnosis not present

## 2018-10-20 DIAGNOSIS — C50912 Malignant neoplasm of unspecified site of left female breast: Secondary | ICD-10-CM | POA: Diagnosis not present

## 2018-10-20 NOTE — Telephone Encounter (Signed)
Contacted patient to verify telephone visit for pre reg °

## 2018-10-21 ENCOUNTER — Inpatient Hospital Stay: Payer: Medicare HMO | Attending: Hematology and Oncology

## 2018-10-21 DIAGNOSIS — Z170 Estrogen receptor positive status [ER+]: Secondary | ICD-10-CM | POA: Diagnosis not present

## 2018-10-21 DIAGNOSIS — I10 Essential (primary) hypertension: Secondary | ICD-10-CM | POA: Diagnosis not present

## 2018-10-21 DIAGNOSIS — E782 Mixed hyperlipidemia: Secondary | ICD-10-CM | POA: Diagnosis not present

## 2018-10-21 DIAGNOSIS — C50912 Malignant neoplasm of unspecified site of left female breast: Secondary | ICD-10-CM | POA: Diagnosis not present

## 2018-10-21 NOTE — Progress Notes (Signed)
Nutrition Follow-up:  Patient with left breast cancer.  Patient s/p lumpectomy and adjuvant chemotherapy (completed on 8/4).  Patient reports that she has started radiation therapy (9/28).    Spoke with patient via phone.  Patient reports that her appetite is better after stopping chemotherapy.  Reports taste is coming back.  Usually eats breakfast and supper but sometimes skips lunch.  Has not started drinking any lactose free shakes yet.      Medications: reviewed  Labs: no new  Anthropometrics:   Weight [facility] where receiving radiation of 124 lb on 9/28.  128 lb on 8/4   NUTRITION DIAGNOSIS: Inadequate oral intake improving    INTERVENTION:  Reviewed lactose free shake options with patient.   Encouraged patient to not skip lunch. Discussed having small snack/mini meal.   Encouraged good source of protein with every meal.  Contact information provided to patient and patient will reach out if has further nutrition questions or concerns    MONITORING, EVALUATION, GOAL: Patient will consume adequate calories to prevent weight loss   NEXT VISIT: no follow-up planning, patient to reach out as needed  Zamiya Dillard B. Zenia Resides, [place], [place] Registered Dietitian [phone] (pager)

## 2018-10-22 DIAGNOSIS — Z170 Estrogen receptor positive status [ER+]: Secondary | ICD-10-CM | POA: Diagnosis not present

## 2018-10-22 DIAGNOSIS — C50912 Malignant neoplasm of unspecified site of left female breast: Secondary | ICD-10-CM | POA: Diagnosis not present

## 2018-10-23 DIAGNOSIS — Z170 Estrogen receptor positive status [ER+]: Secondary | ICD-10-CM | POA: Diagnosis not present

## 2018-10-23 DIAGNOSIS — C50912 Malignant neoplasm of unspecified site of left female breast: Secondary | ICD-10-CM | POA: Diagnosis not present

## 2018-10-26 DIAGNOSIS — Z170 Estrogen receptor positive status [ER+]: Secondary | ICD-10-CM | POA: Diagnosis not present

## 2018-10-26 DIAGNOSIS — C50212 Malignant neoplasm of upper-inner quadrant of left female breast: Secondary | ICD-10-CM | POA: Diagnosis not present

## 2018-10-26 DIAGNOSIS — C50912 Malignant neoplasm of unspecified site of left female breast: Secondary | ICD-10-CM | POA: Diagnosis not present

## 2018-10-27 DIAGNOSIS — Z170 Estrogen receptor positive status [ER+]: Secondary | ICD-10-CM | POA: Diagnosis not present

## 2018-10-27 DIAGNOSIS — C50912 Malignant neoplasm of unspecified site of left female breast: Secondary | ICD-10-CM | POA: Diagnosis not present

## 2018-10-28 DIAGNOSIS — Z170 Estrogen receptor positive status [ER+]: Secondary | ICD-10-CM | POA: Diagnosis not present

## 2018-10-28 DIAGNOSIS — C50912 Malignant neoplasm of unspecified site of left female breast: Secondary | ICD-10-CM | POA: Diagnosis not present

## 2018-10-29 DIAGNOSIS — Z170 Estrogen receptor positive status [ER+]: Secondary | ICD-10-CM | POA: Diagnosis not present

## 2018-10-29 DIAGNOSIS — C50912 Malignant neoplasm of unspecified site of left female breast: Secondary | ICD-10-CM | POA: Diagnosis not present

## 2018-10-30 DIAGNOSIS — C50912 Malignant neoplasm of unspecified site of left female breast: Secondary | ICD-10-CM | POA: Diagnosis not present

## 2018-10-30 DIAGNOSIS — Z170 Estrogen receptor positive status [ER+]: Secondary | ICD-10-CM | POA: Diagnosis not present

## 2018-11-02 DIAGNOSIS — C50912 Malignant neoplasm of unspecified site of left female breast: Secondary | ICD-10-CM | POA: Diagnosis not present

## 2018-11-02 DIAGNOSIS — C50212 Malignant neoplasm of upper-inner quadrant of left female breast: Secondary | ICD-10-CM | POA: Diagnosis not present

## 2018-11-02 DIAGNOSIS — Z170 Estrogen receptor positive status [ER+]: Secondary | ICD-10-CM | POA: Diagnosis not present

## 2018-11-03 DIAGNOSIS — C50912 Malignant neoplasm of unspecified site of left female breast: Secondary | ICD-10-CM | POA: Diagnosis not present

## 2018-11-03 DIAGNOSIS — Z170 Estrogen receptor positive status [ER+]: Secondary | ICD-10-CM | POA: Diagnosis not present

## 2018-11-04 DIAGNOSIS — C50912 Malignant neoplasm of unspecified site of left female breast: Secondary | ICD-10-CM | POA: Diagnosis not present

## 2018-11-04 DIAGNOSIS — Z170 Estrogen receptor positive status [ER+]: Secondary | ICD-10-CM | POA: Diagnosis not present

## 2018-11-05 DIAGNOSIS — Z170 Estrogen receptor positive status [ER+]: Secondary | ICD-10-CM | POA: Diagnosis not present

## 2018-11-05 DIAGNOSIS — C50912 Malignant neoplasm of unspecified site of left female breast: Secondary | ICD-10-CM | POA: Diagnosis not present

## 2018-11-06 DIAGNOSIS — C50912 Malignant neoplasm of unspecified site of left female breast: Secondary | ICD-10-CM | POA: Diagnosis not present

## 2018-11-06 DIAGNOSIS — M25512 Pain in left shoulder: Secondary | ICD-10-CM | POA: Diagnosis not present

## 2018-11-06 DIAGNOSIS — X509XXA Other and unspecified overexertion or strenuous movements or postures, initial encounter: Secondary | ICD-10-CM | POA: Diagnosis not present

## 2018-11-06 DIAGNOSIS — M549 Dorsalgia, unspecified: Secondary | ICD-10-CM | POA: Diagnosis not present

## 2018-11-06 DIAGNOSIS — I10 Essential (primary) hypertension: Secondary | ICD-10-CM | POA: Diagnosis not present

## 2018-11-06 DIAGNOSIS — Z170 Estrogen receptor positive status [ER+]: Secondary | ICD-10-CM | POA: Diagnosis not present

## 2018-11-06 DIAGNOSIS — S43402A Unspecified sprain of left shoulder joint, initial encounter: Secondary | ICD-10-CM | POA: Diagnosis not present

## 2018-11-06 DIAGNOSIS — Z79899 Other long term (current) drug therapy: Secondary | ICD-10-CM | POA: Diagnosis not present

## 2018-11-06 DIAGNOSIS — S46912A Strain of unspecified muscle, fascia and tendon at shoulder and upper arm level, left arm, initial encounter: Secondary | ICD-10-CM | POA: Diagnosis not present

## 2018-11-06 DIAGNOSIS — Z880 Allergy status to penicillin: Secondary | ICD-10-CM | POA: Diagnosis not present

## 2018-11-06 DIAGNOSIS — S161XXA Strain of muscle, fascia and tendon at neck level, initial encounter: Secondary | ICD-10-CM | POA: Diagnosis not present

## 2018-11-06 DIAGNOSIS — S29019A Strain of muscle and tendon of unspecified wall of thorax, initial encounter: Secondary | ICD-10-CM | POA: Diagnosis not present

## 2018-11-07 DIAGNOSIS — M25512 Pain in left shoulder: Secondary | ICD-10-CM | POA: Diagnosis not present

## 2018-11-07 DIAGNOSIS — M549 Dorsalgia, unspecified: Secondary | ICD-10-CM | POA: Diagnosis not present

## 2018-11-09 ENCOUNTER — Encounter: Payer: Self-pay | Admitting: *Deleted

## 2018-11-09 DIAGNOSIS — Z170 Estrogen receptor positive status [ER+]: Secondary | ICD-10-CM | POA: Diagnosis not present

## 2018-11-09 DIAGNOSIS — C50912 Malignant neoplasm of unspecified site of left female breast: Secondary | ICD-10-CM | POA: Diagnosis not present

## 2018-11-10 ENCOUNTER — Telehealth: Payer: Self-pay | Admitting: Hematology and Oncology

## 2018-11-10 NOTE — Telephone Encounter (Signed)
Called patient regarding new scheduled appointments per 10/19 schedule message, patient is notified.

## 2018-11-17 ENCOUNTER — Encounter (INDEPENDENT_AMBULATORY_CARE_PROVIDER_SITE_OTHER): Payer: Self-pay | Admitting: *Deleted

## 2018-11-19 DIAGNOSIS — J189 Pneumonia, unspecified organism: Secondary | ICD-10-CM | POA: Diagnosis not present

## 2018-11-19 DIAGNOSIS — R05 Cough: Secondary | ICD-10-CM | POA: Diagnosis not present

## 2018-11-19 DIAGNOSIS — K219 Gastro-esophageal reflux disease without esophagitis: Secondary | ICD-10-CM | POA: Diagnosis not present

## 2018-11-19 DIAGNOSIS — C50912 Malignant neoplasm of unspecified site of left female breast: Secondary | ICD-10-CM | POA: Diagnosis not present

## 2018-11-19 DIAGNOSIS — Z6821 Body mass index (BMI) 21.0-21.9, adult: Secondary | ICD-10-CM | POA: Diagnosis not present

## 2018-11-20 DIAGNOSIS — I10 Essential (primary) hypertension: Secondary | ICD-10-CM | POA: Diagnosis not present

## 2018-11-20 DIAGNOSIS — E782 Mixed hyperlipidemia: Secondary | ICD-10-CM | POA: Diagnosis not present

## 2018-11-27 DIAGNOSIS — I700 Atherosclerosis of aorta: Secondary | ICD-10-CM | POA: Diagnosis not present

## 2018-11-27 DIAGNOSIS — R05 Cough: Secondary | ICD-10-CM | POA: Diagnosis not present

## 2018-11-27 DIAGNOSIS — J8410 Pulmonary fibrosis, unspecified: Secondary | ICD-10-CM | POA: Diagnosis not present

## 2018-11-27 DIAGNOSIS — J9811 Atelectasis: Secondary | ICD-10-CM | POA: Diagnosis not present

## 2018-11-27 DIAGNOSIS — I313 Pericardial effusion (noninflammatory): Secondary | ICD-10-CM | POA: Diagnosis not present

## 2018-11-27 DIAGNOSIS — J90 Pleural effusion, not elsewhere classified: Secondary | ICD-10-CM | POA: Diagnosis not present

## 2018-11-27 DIAGNOSIS — N200 Calculus of kidney: Secondary | ICD-10-CM | POA: Diagnosis not present

## 2018-12-02 ENCOUNTER — Ambulatory Visit: Payer: Medicare HMO | Admitting: Hematology and Oncology

## 2018-12-04 ENCOUNTER — Other Ambulatory Visit: Payer: Self-pay | Admitting: General Surgery

## 2018-12-04 DIAGNOSIS — Z9049 Acquired absence of other specified parts of digestive tract: Secondary | ICD-10-CM | POA: Diagnosis not present

## 2018-12-04 DIAGNOSIS — Z803 Family history of malignant neoplasm of breast: Secondary | ICD-10-CM | POA: Diagnosis not present

## 2018-12-04 DIAGNOSIS — C50212 Malignant neoplasm of upper-inner quadrant of left female breast: Secondary | ICD-10-CM | POA: Diagnosis not present

## 2018-12-04 DIAGNOSIS — K219 Gastro-esophageal reflux disease without esophagitis: Secondary | ICD-10-CM | POA: Diagnosis not present

## 2018-12-09 ENCOUNTER — Telehealth: Payer: Self-pay | Admitting: Hematology and Oncology

## 2018-12-09 DIAGNOSIS — Z170 Estrogen receptor positive status [ER+]: Secondary | ICD-10-CM | POA: Diagnosis not present

## 2018-12-09 DIAGNOSIS — R109 Unspecified abdominal pain: Secondary | ICD-10-CM | POA: Diagnosis not present

## 2018-12-09 DIAGNOSIS — R05 Cough: Secondary | ICD-10-CM | POA: Diagnosis not present

## 2018-12-09 DIAGNOSIS — K219 Gastro-esophageal reflux disease without esophagitis: Secondary | ICD-10-CM | POA: Diagnosis not present

## 2018-12-09 DIAGNOSIS — S161XXA Strain of muscle, fascia and tendon at neck level, initial encounter: Secondary | ICD-10-CM | POA: Diagnosis not present

## 2018-12-09 DIAGNOSIS — Z6820 Body mass index (BMI) 20.0-20.9, adult: Secondary | ICD-10-CM | POA: Diagnosis not present

## 2018-12-09 DIAGNOSIS — C50912 Malignant neoplasm of unspecified site of left female breast: Secondary | ICD-10-CM | POA: Diagnosis not present

## 2018-12-09 DIAGNOSIS — C50212 Malignant neoplasm of upper-inner quadrant of left female breast: Secondary | ICD-10-CM | POA: Diagnosis not present

## 2018-12-09 NOTE — Telephone Encounter (Signed)
Returned patient's phone call regarding rescheduling an appointment, per patient's request 11/19 appointment has moved to 12/10.

## 2018-12-10 ENCOUNTER — Inpatient Hospital Stay: Payer: Medicare HMO | Admitting: Hematology and Oncology

## 2018-12-31 ENCOUNTER — Ambulatory Visit: Payer: Medicare HMO | Admitting: Hematology and Oncology

## 2019-01-01 ENCOUNTER — Encounter (HOSPITAL_BASED_OUTPATIENT_CLINIC_OR_DEPARTMENT_OTHER): Payer: Self-pay | Admitting: General Surgery

## 2019-01-01 ENCOUNTER — Encounter (HOSPITAL_BASED_OUTPATIENT_CLINIC_OR_DEPARTMENT_OTHER)
Admission: RE | Admit: 2019-01-01 | Discharge: 2019-01-01 | Disposition: A | Discharge status: Home / Self Care | Payer: Medicare HMO | Source: Ambulatory Visit | Attending: General Surgery | Admitting: General Surgery

## 2019-01-01 ENCOUNTER — Other Ambulatory Visit: Payer: Self-pay

## 2019-01-01 DIAGNOSIS — Z01812 Encounter for preprocedural laboratory examination: Secondary | ICD-10-CM | POA: Diagnosis not present

## 2019-01-01 NOTE — Progress Notes (Signed)
EKG reviewed by Dr. Conrad Carbon, will proceed with surgery as scheduled.

## 2019-01-01 NOTE — Progress Notes (Signed)

## 2019-01-02 ENCOUNTER — Other Ambulatory Visit (HOSPITAL_COMMUNITY)
Admission: RE | Admit: 2019-01-02 | Discharge: 2019-01-02 | Disposition: A | Discharge status: Home / Self Care | Payer: Medicare HMO | Source: Ambulatory Visit | Attending: General Surgery | Admitting: General Surgery

## 2019-01-02 DIAGNOSIS — Z20828 Contact with and (suspected) exposure to other viral communicable diseases: Secondary | ICD-10-CM | POA: Diagnosis not present

## 2019-01-02 DIAGNOSIS — Z01812 Encounter for preprocedural laboratory examination: Secondary | ICD-10-CM | POA: Insufficient documentation

## 2019-01-03 LAB — NOVEL CORONAVIRUS, NAA (HOSP ORDER, SEND-OUT TO REF LAB; TAT 18-24 HRS): SARS-CoV-2, NAA: NOT DETECTED

## 2019-01-03 NOTE — H&P (Signed)
[name] Location: [facility] Patient #: [id number] DOB: July 25, 1950 Single / Language: Kimberly Mccormick / Race: Black or African American Female      History of Present Illness   This is a very pleasant 68 year old female who returns for long-term follow-up regarding her left breast cancer. Dr. Lindi Adie is her oncologist. Her PCP is Darrick Huntsman in [place]. Livia Snellen served as my Producer, television/film/video throughout the encounter.  On May 06, 2018 she underwent left rest lumpectomy with sentinel node biopsy. Final pathology showed 1.8 cm invasive ductal carcinoma, negative margins, all 4 nodes negative. ER 95%. PR 0. HER-2 negative. Oncotype high risk Port-A-Cath was inserted by me on Jun 16, 2018. She has now completed her chemotherapy and her radiation therapy She requested I remove her Port-A-Cath and that is reasonable  She has no complaints about her breast, axilla, shoulder or arm Exam today was excellent   Plan: Scheduled for Port-A-Cath removal. I discussed the indications, techniques and risks with her in detail and she agrees Keep appt. with Dr. Lindi Adie on November 19. Decisions will need to be made about antiestrogen therapy She will be due for bilateral diagnostic mammograms in April, 2021     Medication History  Medications Reconciled  Vitals  Weight: 121 lb Height: 63in Body Surface Area: 1.56 m Body Mass Index: 21.43 kg/m  Temp.: 97.83F(Oral)  Pulse: 94 (Regular)  BP: 110/74 (Sitting, Left Arm, Standard)       Physical Exam  General Mental Status-Alert. General Appearance-Not in acute distress. Build & Nutrition-Well nourished. Posture-Normal posture. Gait-Normal.  Head and Neck Head-normocephalic, atraumatic with no lesions or palpable masses. Trachea-midline. Thyroid Gland Characteristics - normal size and consistency and no palpable nodules.  Chest and Lung Exam Chest and lung exam reveals -on  auscultation, normal breath sounds, no adventitious sounds and normal vocal resonance. Note: Port-A-Cath palpable right infraclavicular area. Subclavian approach. Nontender. Well-healed.   Breast Note: Breasts are relatively small and a bit atrophic. Mild radiation changes on the left. Breasts are symmetrical. Nipple alignment excellent. Contour excellent. Transverse lumpectomy incision at 12:00 left breast. Left axilla incisions also soft. No mass no thickening no hematoma no tenderness. Left shoulder range of motion 100%. No arm swelling or sensory deficit. There is no mass in either breast. Is no axillary adenopathy.   Cardiovascular Cardiovascular examination reveals -normal heart sounds, regular rate and rhythm with no murmurs and femoral artery auscultation bilaterally reveals normal pulses, no bruits, no thrills.  Abdomen Inspection Inspection of the abdomen reveals - No Hernias. Palpation/Percussion Palpation and Percussion of the abdomen reveal - Soft, Non Tender, No Rigidity (guarding), No hepatosplenomegaly and No Palpable abdominal masses.  Neurologic Neurologic evaluation reveals -alert and oriented x 3 with no impairment of recent or remote memory, normal attention span and ability to concentrate, normal sensation and normal coordination.  Musculoskeletal Normal Exam - Bilateral-Upper Extremity Strength Normal and Lower Extremity Strength Normal.    Assessment & Plan   PRIMARY CANCER OF UPPER INNER QUADRANT OF LEFT FEMALE BREAST (C50.212)   Examination of both breasts and all the regional lymph nodes today is normal There is no evidence of cancer Continue with your treatment plan as outlined by Dr. Shann Medal have completed your chemotherapy and your radiation therapy Dr. Lindi Adie may place you on antiestrogen therapy Keep your appointment with Dr. Lindi Adie on November 19  you will follow up  in April or May, 2021 after you get your annual  mammograms  Schedule for Surgery  you  have completed your chemotherapy and your radiation therapy You have requested that we removed your Port-A-Cath, and that is reasonable You'll be scheduled for removal of Port-A-Cath by Dr. Dalbert Batman in the near future We have discussed the indications, techniques, and risk of the surgery in detail  [place] (Z80.3) HISTORY OF CHOLECYSTECTOMY (Z90.49) CHRONIC GERD (K21.9)    Rontrell Moquin M. Dalbert Batman, M.D., [facility] General and Minimally invasive Surgery Breast and Colorectal Surgery Office:   [phone]

## 2019-01-04 DIAGNOSIS — N179 Acute kidney failure, unspecified: Secondary | ICD-10-CM | POA: Diagnosis not present

## 2019-01-04 DIAGNOSIS — K219 Gastro-esophageal reflux disease without esophagitis: Secondary | ICD-10-CM | POA: Diagnosis not present

## 2019-01-04 DIAGNOSIS — D509 Iron deficiency anemia, unspecified: Secondary | ICD-10-CM | POA: Diagnosis not present

## 2019-01-04 DIAGNOSIS — E782 Mixed hyperlipidemia: Secondary | ICD-10-CM | POA: Diagnosis not present

## 2019-01-04 DIAGNOSIS — I10 Essential (primary) hypertension: Secondary | ICD-10-CM | POA: Diagnosis not present

## 2019-01-04 DIAGNOSIS — E559 Vitamin D deficiency, unspecified: Secondary | ICD-10-CM | POA: Diagnosis not present

## 2019-01-06 ENCOUNTER — Encounter (HOSPITAL_BASED_OUTPATIENT_CLINIC_OR_DEPARTMENT_OTHER): Payer: Self-pay | Admitting: General Surgery

## 2019-01-06 ENCOUNTER — Encounter (HOSPITAL_BASED_OUTPATIENT_CLINIC_OR_DEPARTMENT_OTHER)
Admission: RE | Disposition: A | Discharge status: Home / Self Care | Payer: Self-pay | Source: Home / Self Care | Attending: General Surgery

## 2019-01-06 ENCOUNTER — Ambulatory Visit (HOSPITAL_BASED_OUTPATIENT_CLINIC_OR_DEPARTMENT_OTHER)
Admission: RE | Admit: 2019-01-06 | Discharge: 2019-01-06 | Disposition: A | Discharge status: Home / Self Care | Payer: Medicare HMO | Attending: General Surgery | Admitting: General Surgery

## 2019-01-06 ENCOUNTER — Ambulatory Visit (HOSPITAL_BASED_OUTPATIENT_CLINIC_OR_DEPARTMENT_OTHER): Payer: Medicare HMO | Admitting: Certified Registered"

## 2019-01-06 ENCOUNTER — Other Ambulatory Visit: Payer: Self-pay

## 2019-01-06 DIAGNOSIS — Z170 Estrogen receptor positive status [ER+]: Secondary | ICD-10-CM | POA: Insufficient documentation

## 2019-01-06 DIAGNOSIS — Z79899 Other long term (current) drug therapy: Secondary | ICD-10-CM | POA: Diagnosis not present

## 2019-01-06 DIAGNOSIS — E669 Obesity, unspecified: Secondary | ICD-10-CM | POA: Insufficient documentation

## 2019-01-06 DIAGNOSIS — Z6821 Body mass index (BMI) 21.0-21.9, adult: Secondary | ICD-10-CM | POA: Insufficient documentation

## 2019-01-06 DIAGNOSIS — Z452 Encounter for adjustment and management of vascular access device: Secondary | ICD-10-CM | POA: Diagnosis not present

## 2019-01-06 DIAGNOSIS — I10 Essential (primary) hypertension: Secondary | ICD-10-CM | POA: Diagnosis not present

## 2019-01-06 DIAGNOSIS — Z9221 Personal history of antineoplastic chemotherapy: Secondary | ICD-10-CM | POA: Insufficient documentation

## 2019-01-06 DIAGNOSIS — C50912 Malignant neoplasm of unspecified site of left female breast: Secondary | ICD-10-CM | POA: Diagnosis not present

## 2019-01-06 DIAGNOSIS — Z95828 Presence of other vascular implants and grafts: Secondary | ICD-10-CM

## 2019-01-06 DIAGNOSIS — Z9012 Acquired absence of left breast and nipple: Secondary | ICD-10-CM | POA: Insufficient documentation

## 2019-01-06 DIAGNOSIS — C50212 Malignant neoplasm of upper-inner quadrant of left female breast: Secondary | ICD-10-CM

## 2019-01-06 DIAGNOSIS — Z803 Family history of malignant neoplasm of breast: Secondary | ICD-10-CM | POA: Insufficient documentation

## 2019-01-06 DIAGNOSIS — K219 Gastro-esophageal reflux disease without esophagitis: Secondary | ICD-10-CM | POA: Insufficient documentation

## 2019-01-06 DIAGNOSIS — Z923 Personal history of irradiation: Secondary | ICD-10-CM | POA: Diagnosis not present

## 2019-01-06 DIAGNOSIS — D649 Anemia, unspecified: Secondary | ICD-10-CM | POA: Insufficient documentation

## 2019-01-06 HISTORY — PX: PORT-A-CATH REMOVAL: SHX5289

## 2019-01-06 SURGERY — REMOVAL PORT-A-CATH
Anesthesia: Monitor Anesthesia Care | Site: Chest

## 2019-01-06 MED ORDER — CELECOXIB 200 MG PO CAPS
ORAL_CAPSULE | ORAL | Status: AC
  Filled 2019-01-06: qty 1

## 2019-01-06 MED ORDER — FENTANYL CITRATE (PF) 100 MCG/2ML IJ SOLN
INTRAMUSCULAR | Status: DC | PRN
  Administered 2019-01-06: 50 ug via INTRAVENOUS

## 2019-01-06 MED ORDER — GABAPENTIN 300 MG PO CAPS
ORAL_CAPSULE | ORAL | Status: AC
  Filled 2019-01-06: qty 1

## 2019-01-06 MED ORDER — CELECOXIB 200 MG PO CAPS
200.0000 mg | ORAL_CAPSULE | ORAL | Status: AC
  Administered 2019-01-06: 200 mg via ORAL

## 2019-01-06 MED ORDER — ONDANSETRON HCL 4 MG/2ML IJ SOLN: 4.0000 mg | Freq: Once | INTRAMUSCULAR | Status: DC | PRN

## 2019-01-06 MED ORDER — CEFAZOLIN SODIUM-DEXTROSE 2-4 GM/100ML-% IV SOLN: 2.0000 g | INTRAVENOUS | Status: DC

## 2019-01-06 MED ORDER — HYDROCODONE-ACETAMINOPHEN 7.5-325 MG PO TABS: 1.0000 | ORAL_TABLET | Freq: Once | ORAL | Status: DC | PRN

## 2019-01-06 MED ORDER — GABAPENTIN 300 MG PO CAPS
300.0000 mg | ORAL_CAPSULE | ORAL | Status: AC
  Administered 2019-01-06: 300 mg via ORAL

## 2019-01-06 MED ORDER — CHLORHEXIDINE GLUCONATE CLOTH 2 % EX PADS: 6.0000 | MEDICATED_PAD | Freq: Once | CUTANEOUS | Status: DC

## 2019-01-06 MED ORDER — SODIUM CHLORIDE 0.9% FLUSH: 3.0000 mL | Freq: Two times a day (BID) | INTRAVENOUS | Status: DC

## 2019-01-06 MED ORDER — ONDANSETRON HCL 4 MG/2ML IJ SOLN
INTRAMUSCULAR | Status: DC | PRN
  Administered 2019-01-06: 4 mg via INTRAVENOUS

## 2019-01-06 MED ORDER — ACETAMINOPHEN 500 MG PO TABS
1000.0000 mg | ORAL_TABLET | ORAL | Status: AC
  Administered 2019-01-06: 10:00:00 1000 mg via ORAL

## 2019-01-06 MED ORDER — LIDOCAINE 1 % OPTIME INJ - NO CHARGE
INTRAMUSCULAR | Status: DC | PRN
  Administered 2019-01-06: 11:00:00 10 mL

## 2019-01-06 MED ORDER — FENTANYL CITRATE (PF) 100 MCG/2ML IJ SOLN
INTRAMUSCULAR | Status: AC
  Filled 2019-01-06: qty 2

## 2019-01-06 MED ORDER — LACTATED RINGERS IV SOLN: INTRAVENOUS | Status: DC

## 2019-01-06 MED ORDER — SODIUM BICARBONATE 4 % IV SOLN
INTRAVENOUS | Status: DC | PRN
  Administered 2019-01-06: 2 mL

## 2019-01-06 MED ORDER — PROPOFOL 500 MG/50ML IV EMUL
INTRAVENOUS | Status: DC | PRN
  Administered 2019-01-06: 150 ug/kg/min via INTRAVENOUS

## 2019-01-06 MED ORDER — CEFAZOLIN SODIUM-DEXTROSE 2-3 GM-%(50ML) IV SOLR
INTRAVENOUS | Status: DC | PRN
  Administered 2019-01-06: 2 g via INTRAVENOUS

## 2019-01-06 MED ORDER — CEFAZOLIN SODIUM-DEXTROSE 2-4 GM/100ML-% IV SOLN
INTRAVENOUS | Status: AC
  Filled 2019-01-06: qty 100

## 2019-01-06 MED ORDER — ONDANSETRON HCL 4 MG/2ML IJ SOLN
INTRAMUSCULAR | Status: AC
  Filled 2019-01-06: qty 2

## 2019-01-06 MED ORDER — ACETAMINOPHEN 500 MG PO TABS
ORAL_TABLET | ORAL | Status: AC
  Filled 2019-01-06: qty 2

## 2019-01-06 MED ORDER — MEPERIDINE HCL 25 MG/ML IJ SOLN: 6.2500 mg | INTRAMUSCULAR | Status: DC | PRN

## 2019-01-06 MED ORDER — FENTANYL CITRATE (PF) 100 MCG/2ML IJ SOLN: 25.0000 ug | INTRAMUSCULAR | Status: DC | PRN

## 2019-01-06 SURGICAL SUPPLY — 40 items
ADH SKN CLS APL DERMABOND .7 (GAUZE/BANDAGES/DRESSINGS) ×1
APL PRP STRL LF DISP 70% ISPRP (MISCELLANEOUS) ×1
APL SKNCLS STERI-STRIP NONHPOA (GAUZE/BANDAGES/DRESSINGS)
BENZOIN TINCTURE PRP APPL 2/3 (GAUZE/BANDAGES/DRESSINGS) IMPLANT
BLADE HEX COATED 2.75 (ELECTRODE) ×3 IMPLANT
BLADE SURG 15 STRL LF DISP TIS (BLADE) ×1 IMPLANT
BLADE SURG 15 STRL SS (BLADE) ×3
CHLORAPREP W/TINT 26 (MISCELLANEOUS) ×3 IMPLANT
CLOSURE WOUND 1/2 X4 (GAUZE/BANDAGES/DRESSINGS)
COVER BACK TABLE REUSABLE LG (DRAPES) ×3 IMPLANT
COVER MAYO STAND REUSABLE (DRAPES) ×3 IMPLANT
COVER WAND RF STERILE (DRAPES) IMPLANT
DECANTER SPIKE VIAL GLASS SM (MISCELLANEOUS) IMPLANT
DERMABOND ADVANCED (GAUZE/BANDAGES/DRESSINGS) ×2
DERMABOND ADVANCED .7 DNX12 (GAUZE/BANDAGES/DRESSINGS) ×1 IMPLANT
DRAPE LAPAROTOMY 100X72 PEDS (DRAPES) ×3 IMPLANT
DRAPE UTILITY XL STRL (DRAPES) ×3 IMPLANT
DRSG TEGADERM 4X4.75 (GAUZE/BANDAGES/DRESSINGS) IMPLANT
ELECT REM PT RETURN 9FT ADLT (ELECTROSURGICAL) ×3
ELECTRODE REM PT RTRN 9FT ADLT (ELECTROSURGICAL) ×1 IMPLANT
GAUZE SPONGE 4X4 12PLY STRL LF (GAUZE/BANDAGES/DRESSINGS) IMPLANT
GLOVE SS BIOGEL STRL SZ 7 (GLOVE) ×1 IMPLANT
GLOVE SUPERSENSE BIOGEL SZ 7 (GLOVE) ×2
GOWN STRL REUS W/ TWL LRG LVL3 (GOWN DISPOSABLE) ×1 IMPLANT
GOWN STRL REUS W/ TWL XL LVL3 (GOWN DISPOSABLE) ×1 IMPLANT
GOWN STRL REUS W/TWL LRG LVL3 (GOWN DISPOSABLE) ×3
GOWN STRL REUS W/TWL XL LVL3 (GOWN DISPOSABLE) ×3
NDL HYPO 25X1 1.5 SAFETY (NEEDLE) ×1 IMPLANT
NEEDLE HYPO 25X1 1.5 SAFETY (NEEDLE) ×3 IMPLANT
PACK BASIN DAY SURGERY FS (CUSTOM PROCEDURE TRAY) ×3 IMPLANT
PENCIL SMOKE EVACUATOR (MISCELLANEOUS) ×3 IMPLANT
SLEEVE SCD COMPRESS KNEE MED (MISCELLANEOUS) ×3 IMPLANT
STRIP CLOSURE SKIN 1/2X4 (GAUZE/BANDAGES/DRESSINGS) IMPLANT
SUT MNCRL AB 4-0 PS2 18 (SUTURE) ×3 IMPLANT
SUT VICRYL 3-0 CR8 SH (SUTURE) ×3 IMPLANT
SYR 10ML LL (SYRINGE) ×3 IMPLANT
TOWEL GREEN STERILE FF (TOWEL DISPOSABLE) ×3 IMPLANT
TUBE CONNECTING 20'X1/4 (TUBING)
TUBE CONNECTING 20X1/4 (TUBING) IMPLANT
YANKAUER SUCT BULB TIP NO VENT (SUCTIONS) IMPLANT

## 2019-01-06 NOTE — Transfer of Care (Signed)
Immediate Anesthesia Transfer of Care Note  Patient: Kimberly Mccormick  Procedure(s) Performed: REMOVAL PORT-A-CATH (N/A Chest)  Patient Location: PACU  Anesthesia Type:MAC  Level of Consciousness: awake, alert  and oriented  Airway & Oxygen Therapy: Patient Spontanous Breathing and Patient connected to face mask oxygen  Post-op Assessment: Report given to RN and Post -op Vital signs reviewed and stable  Post vital signs: Reviewed and stable  Last Vitals:  Vitals Value Taken Time  BP    Temp    Pulse    Resp    SpO2      Last Pain:  Vitals:   01/06/19 1021  TempSrc: Temporal  PainSc: 0-No pain      Patients Stated Pain Goal: 0 (29/56/21 3086)  Complications: No apparent anesthesia complications

## 2019-01-06 NOTE — Anesthesia Postprocedure Evaluation (Signed)
Anesthesia Post Note  Patient: Kimberly Mccormick  Procedure(s) Performed: REMOVAL PORT-A-CATH (N/A Chest)     Patient location during evaluation: PACU Anesthesia Type: MAC Level of consciousness: awake and alert and oriented Pain management: pain level controlled Vital Signs Assessment: post-procedure vital signs reviewed and stable Respiratory status: spontaneous breathing, nonlabored ventilation and respiratory function stable Cardiovascular status: stable and blood pressure returned to baseline Postop Assessment: no apparent nausea or vomiting Anesthetic complications: no Comments: Family and patient informed of abnormal EKG and need for further follow up. Given copies of EKG's.    Last Vitals:  Vitals:   01/06/19 1148 01/06/19 1200  BP:  (!) 148/71  Pulse:  (!) 48  Resp:  (!) 22  Temp:    SpO2: 100% 99%    Last Pain:  Vitals:   01/06/19 1200  TempSrc:   PainSc: 0-No pain                 Brayant Dorr A.

## 2019-01-06 NOTE — Interval H&P Note (Signed)
History and Physical Interval Note:  01/06/2019 10:02 AM  Kimberly Mccormick  has presented today for surgery, with the diagnosis of LEFT BREAST CANCER.  The various methods of treatment have been discussed with the patient and family. After consideration of risks, benefits and other options for treatment, the patient has consented to  Procedure(s): REMOVAL PORT-A-CATH (N/A) as a surgical intervention.  The patient's history has been reviewed, patient examined, no change in status, stable for surgery.  I have reviewed the patient's chart and labs.  Questions were answered to the patient's satisfaction.     Adin Hector

## 2019-01-06 NOTE — Anesthesia Postprocedure Evaluation (Deleted)
Anesthesia Post Note  Patient: Kimberly Mccormick  Procedure(s) Performed: REMOVAL PORT-A-CATH (N/A Chest)     Anesthesia Post Evaluation  Last Vitals:  Vitals:   01/06/19 1148 01/06/19 1200  BP:  (!) 148/71  Pulse:  (!) 48  Resp:  (!) 22  Temp:    SpO2: 100% 99%    Last Pain:  Vitals:   01/06/19 1200  TempSrc:   PainSc: 0-No pain                 Seven Dollens A.

## 2019-01-06 NOTE — Discharge Instructions (Signed)
Ice pack to wound for 10 minutes at a time, intermittently for 24 hours You may shower tomorrow The clear plastic superglue will wear off in 2 to 3 weeks.  Just like last time. No tub baths or swimming pools for 2 weeks Keep your appointment with your oncologist on schedule Be sure to get annual mammograms on schedule Follow-up with [place] surgery in 6 months  Pain should be very mild. Take Tylenol 1000 mg every 6 hours for 48 hours and that should control the pain very well   Post Anesthesia Home Care Instructions  Activity: Get plenty of rest for the remainder of the day. A responsible individual must stay with you for 24 hours following the procedure.  For the next 24 hours, DO NOT: -Drive a car -Paediatric nurse -Drink alcoholic beverages -Take any medication unless instructed by your physician -Make any legal decisions or sign important papers.  Meals: Start with liquid foods such as gelatin or soup. Progress to regular foods as tolerated. Avoid greasy, spicy, heavy foods. If nausea and/or vomiting occur, drink only clear liquids until the nausea and/or vomiting subsides. Call your physician if vomiting continues.  Special Instructions/Symptoms: Your throat may feel dry or sore from the anesthesia or the breathing tube placed in your throat during surgery. If this causes discomfort, gargle with warm salt water. The discomfort should disappear within 24 hours.  If you had a scopolamine patch placed behind your ear for the management of post- operative nausea and/or vomiting:  1. The medication in the patch is effective for 72 hours, after which it should be removed.  Wrap patch in a tissue and discard in the trash. Wash hands thoroughly with soap and water. 2. You may remove the patch earlier than 72 hours if you experience unpleasant side effects which may include dry mouth, dizziness or visual disturbances. 3. Avoid touching the patch. Wash your hands with soap and  water after contact with the patch.

## 2019-01-06 NOTE — Op Note (Signed)
Patient Name:           Kimberly Mccormick   Date of Surgery:        01/06/2019  Pre op Diagnosis:      Port-A-Cath in place  Post op Diagnosis:    Port-A-Cath in place  Procedure:                 Removal of Port-A-Cath  Surgeon:                     Edsel Petrin. Dalbert Batman, M.D., FACS  Assistant:                      OR staff  Operative Indications:   This is a very pleasant 68 year old female who is brought to the operating room for  elective removal of her Port-A-Cath.. Dr. Lindi Adie is her oncologist. Her PCP is Darrick Huntsman in [place].       On May 06, 2018 she underwent left rest lumpectomy with sentinel node biopsy. Final pathology showed 1.8 cm invasive ductal carcinoma, negative margins, all 4 nodes negative. ER 95%. PR 0. HER-2 negative. Oncotype high risk. Port-A-Cath was inserted by me on Jun 16, 2018. She has now completed her chemotherapy and her radiation therapy She requested I remove her Port-A-Cath and that is reasonable She will be due for bilateral diagnostic mammograms in April, 2021  Operative Findings:       The port was removed from the right infraclavicular position without difficulty.  There was no signs of infection or seroma.  Procedure in Detail:          The patient was brought to the operating room, placed supine on the table.  She was monitored and sedated by the anesthesia department.  The right upper chest wall was prepped and draped in a sterile fashion.  Surgical timeout was performed.  1% Xylocaine with epinephrine was used as a local infiltration anesthetic.  Transverse incision was made in the right infraclavicular area through the old scar overlying the palpable port.  Dissection was carried down and the port was dissected away from its capsule.  Prolene sutures were cut and the port and catheter were removed intact.  There was no bleeding.  Deep subcutaneous tissue was closed with 3-0 Vicryl sutures and the skin closed with a running subcuticular 4-0  Monocryl and Dermabond.  The patient tolerated the procedure well was taken to PACU in stable condition.  EBL less than 5 cc.  Counts correct.  Complications none.     Edsel Petrin. Dalbert Batman, M.D., FACS General and Minimally Invasive Surgery Breast and Colorectal Surgery  01/06/2019 11:26 AM

## 2019-01-06 NOTE — Anesthesia Preprocedure Evaluation (Addendum)
Anesthesia Evaluation  Patient identified by MRN, date of birth, ID band Patient awake    Reviewed: Allergy & Precautions, NPO status , Patient's Chart, lab work & pertinent test results, reviewed documented beta blocker date and time   Airway Mallampati: I  TM Distance: >3 FB Neck ROM: Full    Dental no notable dental hx. (+) Lower Dentures, Upper Dentures   Pulmonary neg pulmonary ROS,    Pulmonary exam normal breath sounds clear to auscultation       Cardiovascular hypertension, Pt. on medications and Pt. on home beta blockers Normal cardiovascular exam Rhythm:Regular Rate:Normal     Neuro/Psych negative neurological ROS  negative psych ROS   GI/Hepatic Neg liver ROS, GERD  Controlled and Medicated,  Endo/Other  Left Breast Ca- S/P mastectomy, chemoRx  Renal/GU negative Renal ROS  negative genitourinary   Musculoskeletal   Abdominal (+) - obese,   Peds  Hematology  (+) anemia ,   Anesthesia Other Findings   Reproductive/Obstetrics HSV                            Anesthesia Physical Anesthesia Plan  ASA: II  Anesthesia Plan: MAC   Post-op Pain Management:    Induction: Intravenous  PONV Risk Score and Plan: 2 and Ondansetron, Propofol infusion and Treatment may vary due to age or medical condition  Airway Management Planned: Natural Airway, Nasal Cannula and Simple Face Mask  Additional Equipment:   Intra-op Plan:   Post-operative Plan:   Informed Consent: I have reviewed the patients History and Physical, chart, labs and discussed the procedure including the risks, benefits and alternatives for the proposed anesthesia with the patient or authorized representative who has indicated his/her understanding and acceptance.     Dental advisory given  Plan Discussed with: CRNA and Surgeon  Anesthesia Plan Comments:         Anesthesia Quick Evaluation

## 2019-01-07 ENCOUNTER — Ambulatory Visit: Payer: Medicare HMO | Admitting: Hematology and Oncology

## 2019-01-07 ENCOUNTER — Encounter: Payer: Self-pay | Admitting: *Deleted

## 2019-01-12 DIAGNOSIS — R9431 Abnormal electrocardiogram [ECG] [EKG]: Secondary | ICD-10-CM | POA: Diagnosis not present

## 2019-01-12 DIAGNOSIS — K219 Gastro-esophageal reflux disease without esophagitis: Secondary | ICD-10-CM | POA: Diagnosis not present

## 2019-01-12 DIAGNOSIS — I110 Hypertensive heart disease with heart failure: Secondary | ICD-10-CM | POA: Diagnosis not present

## 2019-01-12 DIAGNOSIS — I509 Heart failure, unspecified: Secondary | ICD-10-CM | POA: Diagnosis not present

## 2019-01-12 DIAGNOSIS — I10 Essential (primary) hypertension: Secondary | ICD-10-CM | POA: Diagnosis not present

## 2019-01-12 DIAGNOSIS — C50912 Malignant neoplasm of unspecified site of left female breast: Secondary | ICD-10-CM | POA: Diagnosis not present

## 2019-01-12 DIAGNOSIS — E782 Mixed hyperlipidemia: Secondary | ICD-10-CM | POA: Diagnosis not present

## 2019-01-12 DIAGNOSIS — D509 Iron deficiency anemia, unspecified: Secondary | ICD-10-CM | POA: Diagnosis not present

## 2019-01-12 DIAGNOSIS — M545 Low back pain: Secondary | ICD-10-CM | POA: Diagnosis not present

## 2019-01-12 DIAGNOSIS — Z6820 Body mass index (BMI) 20.0-20.9, adult: Secondary | ICD-10-CM | POA: Diagnosis not present

## 2019-01-13 NOTE — Progress Notes (Signed)
Patient Care Team: Curlene Labrum, MD as PCP - Philomena Doheny, Paulette Blanch, RN as Oncology Nurse Navigator Rockwell Germany, RN as Oncology Nurse Navigator  DIAGNOSIS:    ICD-10-CM   1. Malignant neoplasm of upper-inner quadrant of left breast in female, estrogen receptor positive ([place])  C50.212    Z17.0     SUMMARY OF ONCOLOGIC HISTORY: Oncology History  Malignant neoplasm of upper-inner quadrant of left breast in female, estrogen receptor positive ([place])  03/24/2018 Initial Diagnosis   Screening detected left breast mass 8 mm upper inner quadrant biopsy revealed grade 2 IDC with DCIS ER 95%, PR 0%, Ki-67 20%, HER-2 -1+ by IHC, T1 BN 0 stage Ia clinical stage   05/06/2018 Surgery   Lumpectomy Dalbert Batman): IDC with DCIS, 1.8cm, grade 2, ER+ (95%), PR-, HER2 negative (1+, IHC), Ki67 20%, clear margins, 4 SLN negative.    05/18/2018 Oncotype testing   Oncotype DX recurrence score 30: risk of distant recurrence at 9 years is 19%. Chemo benefit is >15%.    06/23/2018 -  Chemotherapy   Adjuvant chemotherapy with dose dense Adriamycin and Cytoxan x4 followed by Taxol weekly x12     CHIEF COMPLIANT: Follow-up to discuss antiestrogen therapy  INTERVAL HISTORY: Kimberly Mccormick is a 68 y.o. with above-mentioned history of left breast cancer treated with lumpectomy, adjuvant chemotherapy, and radiation at [facility]. Her port was removed by Dr. Dalbert Batman on 01/06/19. She presents to the clinic today to discuss anti-estrogen therapy.   REVIEW OF SYSTEMS:   Constitutional: Denies fevers, chills or abnormal weight loss Eyes: Denies blurriness of vision Ears, nose, mouth, throat, and face: Denies mucositis or sore throat Respiratory: Denies cough, dyspnea or wheezes Cardiovascular: Denies palpitation, chest discomfort Gastrointestinal: Denies nausea, heartburn or change in bowel habits Skin: Denies abnormal skin rashes Lymphatics: Denies new lymphadenopathy or easy bruising Neurological: Denies numbness,  tingling or new weaknesses Behavioral/Psych: Mood is stable, no new changes  Extremities: No lower extremity edema Breast: denies any pain or lumps or nodules in either breasts All other systems were reviewed with the patient and are negative.  I have reviewed the past medical history, past surgical history, social history and family history with the patient and they are unchanged from previous note.  ALLERGIES:  is allergic to eggs or egg-derived products; other; and penicillins.  MEDICATIONS:  Current Outpatient Medications  Medication Sig Dispense Refill  . acyclovir (ZOVIRAX) 400 MG tablet Take 400 mg by mouth 2 (two) times daily.     . cetirizine (ZYRTEC) 10 MG tablet Take 10 mg by mouth daily.    . Cholecalciferol (VITAMIN D) 2000 UNITS tablet Take 2,000 Units by mouth daily.    Marland Kitchen dexamethasone (DECADRON) 4 MG tablet Take 1 tablet (4 mg total) by mouth 2 (two) times daily. Take 1 tablet day before chemo and 1 tablet day after chemo with food 2 tablet 0  . metoprolol succinate (TOPROL-XL) 25 MG 24 hr tablet Take 25 mg by mouth daily.  3  . Multiple Vitamins-Minerals (MULTIVITAMIN WITH MINERALS) tablet Take 1 tablet by mouth daily.    Marland Kitchen omeprazole (PRILOSEC) 40 MG capsule Take 40 mg by mouth daily.    . ondansetron (ZOFRAN) 8 MG tablet Take 1 tablet (8 mg total) by mouth 2 (two) times daily as needed for refractory nausea / vomiting. Start on day 3 after chemo. 30 tablet 1  . prochlorperazine (COMPAZINE) 10 MG tablet Take 1 tablet (10 mg total) by mouth every 6 (six) hours  as needed (Nausea or vomiting). 30 tablet 1  . zolpidem (AMBIEN) 10 MG tablet Take 10 mg by mouth at bedtime as needed for sleep.     No current facility-administered medications for this visit.   Facility-Administered Medications Ordered in Other Visits  Medication Dose Route Frequency Provider Last Rate Last Admin  . sodium chloride flush (NS) 0.9 % injection 10 mL  10 mL Intracatheter PRN Nicholas Lose, MD   10  mL at 08/04/18 1518    PHYSICAL EXAMINATION: ECOG PERFORMANCE STATUS: 1 - Symptomatic but completely ambulatory  There were no vitals filed for this visit. There were no vitals filed for this visit.  GENERAL: alert, no distress and comfortable SKIN: skin color, texture, turgor are normal, no rashes or significant lesions EYES: normal, Conjunctiva are pink and non-injected, sclera clear OROPHARYNX: no exudate, no erythema and lips, buccal mucosa, and tongue normal  NECK: supple, thyroid normal size, non-tender, without nodularity LYMPH: no palpable lymphadenopathy in the cervical, axillary or inguinal LUNGS: clear to auscultation and percussion with normal breathing effort HEART: regular rate & rhythm and no murmurs and no lower extremity edema ABDOMEN: abdomen soft, non-tender and normal bowel sounds MUSCULOSKELETAL: no cyanosis of digits and no clubbing  NEURO: alert & oriented x 3 with fluent speech, no focal motor/sensory deficits EXTREMITIES: No lower extremity edema  LABORATORY DATA:  I have reviewed the data as listed CMP Latest Ref Rng & Units 08/25/2018 08/04/2018 07/14/2018  Glucose 70 - 99 mg/dL 142(H) 160(H) 141(H)  BUN 8 - 23 mg/dL '8 13 17  '$ Creatinine 0.44 - 1.00 mg/dL 0.73 0.74 0.83  Sodium 135 - 145 mmol/L 140 141 139  Potassium 3.5 - 5.1 mmol/L 3.8 3.8 3.8  Chloride 98 - 111 mmol/L 107 109 105  CO2 22 - 32 mmol/L '24 23 23  '$ Calcium 8.9 - 10.3 mg/dL 9.3 9.0 9.5  Total Protein 6.5 - 8.1 g/dL 7.2 6.9 7.4  Total Bilirubin 0.3 - 1.2 mg/dL 0.7 0.7 0.7  Alkaline Phos 38 - 126 U/L 117 107 126  AST 15 - 41 U/L '18 26 23  '$ ALT 0 - 44 U/L 15 23 32    Lab Results  Component Value Date   WBC 3.7 (L) 08/25/2018   HGB 10.1 (L) 08/25/2018   HCT 30.6 (L) 08/25/2018   MCV 80.1 08/25/2018   PLT 354 08/25/2018   NEUTROABS 2.7 08/25/2018    ASSESSMENT & PLAN:  Malignant neoplasm of upper-inner quadrant of left breast in female, estrogen receptor positive ([place]) 05/08/2018  lumpectomy Dalbert Batman): IDC with DCIS, 1.8cm, grade 2, ER+ (95%), PR-, HER2 negative (1+, IHC), Ki67 20%, clear margins, 4 SLN negative. Oncotype DX score 30: Distant recurrence risk at 9 years 19% Treatment plan: 1. Adjuvant chemotherapy with Taxotere and Cytoxan x4 2. Adjuvant radiation therapy at [facility] 3. Followed by adjuvant antiestrogen therapy ---------------------------------------------------------------------------------------------------------------------------------------------- Treatment plan: I recommended anastrozole 1 mg p.o. daily x 5-7 years  Previously I provided her with all the information.  She firmly believes in prayer to answer her questions. Back pain issues: Will obtain CT chest abdomen pelvis in 1 month. We will call her after the scan.  She will then make a decision on anastrozole.  I strongly recommended it because of her high risk nature.  Neuropathy: Moderate in severity.we discussed Gabapentin but she does not want to take it at this time.  If her symptoms get worse she will consider it.  We will do a telephone visit after the CT scans  in January and make a decision on anastrozole at that time.   No orders of the defined types were placed in this encounter.  The patient has a good understanding of the overall plan. she agrees with it. she will call with any problems that may develop before the next visit here.  Nicholas Lose, MD 01/14/2019  Julious Oka Dorshimer, am acting as scribe for Dr. Nicholas Lose.  I have reviewed the above document for accuracy and completeness, and I agree with the above.

## 2019-01-14 ENCOUNTER — Telehealth: Payer: Self-pay | Admitting: Hematology and Oncology

## 2019-01-14 ENCOUNTER — Other Ambulatory Visit: Payer: Self-pay

## 2019-01-14 ENCOUNTER — Inpatient Hospital Stay: Payer: Medicare HMO | Attending: Hematology and Oncology | Admitting: Hematology and Oncology

## 2019-01-14 DIAGNOSIS — Z170 Estrogen receptor positive status [ER+]: Secondary | ICD-10-CM | POA: Diagnosis not present

## 2019-01-14 DIAGNOSIS — M549 Dorsalgia, unspecified: Secondary | ICD-10-CM | POA: Insufficient documentation

## 2019-01-14 DIAGNOSIS — G629 Polyneuropathy, unspecified: Secondary | ICD-10-CM | POA: Diagnosis not present

## 2019-01-14 DIAGNOSIS — Z79899 Other long term (current) drug therapy: Secondary | ICD-10-CM | POA: Insufficient documentation

## 2019-01-14 DIAGNOSIS — Z7952 Long term (current) use of systemic steroids: Secondary | ICD-10-CM | POA: Insufficient documentation

## 2019-01-14 DIAGNOSIS — C50212 Malignant neoplasm of upper-inner quadrant of left female breast: Secondary | ICD-10-CM

## 2019-01-14 DIAGNOSIS — Z923 Personal history of irradiation: Secondary | ICD-10-CM | POA: Diagnosis not present

## 2019-01-14 DIAGNOSIS — Z9221 Personal history of antineoplastic chemotherapy: Secondary | ICD-10-CM | POA: Diagnosis not present

## 2019-01-14 DIAGNOSIS — K219 Gastro-esophageal reflux disease without esophagitis: Secondary | ICD-10-CM | POA: Diagnosis not present

## 2019-01-14 NOTE — Assessment & Plan Note (Signed)
05/08/2018 lumpectomy Kimberly Mccormick): IDC with DCIS, 1.8cm, grade 2, ER+ (95%), PR-, HER2 negative (1+, IHC), Ki67 20%, clear margins, 4 SLN negative. Oncotype DX score 30: Distant recurrence risk at 9 years 19% Treatment plan: 1. Adjuvant chemotherapy with Taxotere and Cytoxan x4 2. Adjuvant radiation therapy at [facility] 3. Followed by adjuvant antiestrogen therapy ---------------------------------------------------------------------------------------------------------------------------------------------- Treatment plan: I recommended anastrozole 1 mg p.o. daily x 5-7 years  Previously I provided her with all the information.  She firmly believes in prayer to answer her questions. Return to clinic in 3 months for survivorship care plan visit

## 2019-01-14 NOTE — Telephone Encounter (Signed)
I talk with patient regarding schedule  

## 2019-01-18 ENCOUNTER — Encounter: Payer: Self-pay | Admitting: *Deleted

## 2019-01-19 DIAGNOSIS — I10 Essential (primary) hypertension: Secondary | ICD-10-CM | POA: Diagnosis not present

## 2019-01-19 DIAGNOSIS — R002 Palpitations: Secondary | ICD-10-CM | POA: Diagnosis not present

## 2019-01-19 DIAGNOSIS — R9431 Abnormal electrocardiogram [ECG] [EKG]: Secondary | ICD-10-CM | POA: Diagnosis not present

## 2019-01-19 DIAGNOSIS — R011 Cardiac murmur, unspecified: Secondary | ICD-10-CM | POA: Diagnosis not present

## 2019-01-19 DIAGNOSIS — R079 Chest pain, unspecified: Secondary | ICD-10-CM | POA: Diagnosis not present

## 2019-01-29 DIAGNOSIS — J90 Pleural effusion, not elsewhere classified: Secondary | ICD-10-CM | POA: Diagnosis not present

## 2019-01-29 DIAGNOSIS — I313 Pericardial effusion (noninflammatory): Secondary | ICD-10-CM | POA: Diagnosis not present

## 2019-01-29 DIAGNOSIS — Z9221 Personal history of antineoplastic chemotherapy: Secondary | ICD-10-CM | POA: Diagnosis not present

## 2019-01-29 DIAGNOSIS — R9431 Abnormal electrocardiogram [ECG] [EKG]: Secondary | ICD-10-CM | POA: Diagnosis not present

## 2019-01-29 DIAGNOSIS — Z923 Personal history of irradiation: Secondary | ICD-10-CM | POA: Diagnosis not present

## 2019-01-29 DIAGNOSIS — Z853 Personal history of malignant neoplasm of breast: Secondary | ICD-10-CM | POA: Diagnosis not present

## 2019-01-29 DIAGNOSIS — R002 Palpitations: Secondary | ICD-10-CM | POA: Diagnosis not present

## 2019-01-29 DIAGNOSIS — I10 Essential (primary) hypertension: Secondary | ICD-10-CM | POA: Diagnosis not present

## 2019-01-29 DIAGNOSIS — R079 Chest pain, unspecified: Secondary | ICD-10-CM | POA: Diagnosis not present

## 2019-02-04 ENCOUNTER — Encounter: Payer: Self-pay | Admitting: Cardiology

## 2019-02-04 ENCOUNTER — Ambulatory Visit: Payer: Medicare HMO | Admitting: Cardiology

## 2019-02-04 ENCOUNTER — Encounter: Payer: Self-pay | Admitting: *Deleted

## 2019-02-04 ENCOUNTER — Other Ambulatory Visit: Payer: Self-pay

## 2019-02-04 VITALS — BP 122/78 | HR 75 | Ht 63.0 in | Wt 120.0 lb

## 2019-02-04 DIAGNOSIS — R9431 Abnormal electrocardiogram [ECG] [EKG]: Secondary | ICD-10-CM

## 2019-02-04 DIAGNOSIS — I2510 Atherosclerotic heart disease of native coronary artery without angina pectoris: Secondary | ICD-10-CM | POA: Diagnosis not present

## 2019-02-04 DIAGNOSIS — E782 Mixed hyperlipidemia: Secondary | ICD-10-CM

## 2019-02-04 DIAGNOSIS — I10 Essential (primary) hypertension: Secondary | ICD-10-CM

## 2019-02-04 NOTE — Patient Instructions (Addendum)
Medication Instructions:   Your physician recommends that you continue on your current medications as directed. Please refer to the Current Medication list given to you today.  Labwork:  NONE  Testing/Procedures:  NONE  Follow-Up:  Your physician recommends that you schedule a follow-up appointment in: as needed.   Any Other Special Instructions Will Be Listed Below (If Applicable).  If you need a refill on your cardiac medications before your next appointment, please call your pharmacy. 

## 2019-02-04 NOTE — Progress Notes (Signed)
Cardiology Office Note  Date: 02/04/2019   ID: Tanveer, Bergstrand [birth year], MRN [id number]  PCP:  Curlene Labrum, MD  Consulting Cardiologist:  Rozann Lesches, MD Electrophysiologist:  None   Chief Complaint  Patient presents with  . Abnormal ECG    History of Present Illness: Kimberly Mccormick is a 69 y.o. female referred for cardiology consultation by Dr. Pleas Koch due to abnormal ECG.  I reviewed available records.  She has a history of left breast cancer now status post chemotherapy and radiation.  She underwent removal of a Port-A-Cath by Dr. Dalbert Batman on December 16, records do not indicate any obvious complications.  She had had a preprocedure ECG that was abnormal. I personally reviewed the ECG from January 01, 2019 which showed sinus rhythm with anterolateral and inferior T wave inversions, new in comparison to tracing from June 2019.  She had previous cardiology evaluation by Dr. Hamilton Capri with [facility] back in 2016.  Records indicate treatment for angina pectoris with medical therapy.  It is not clear that she had any evidence of obstructive CAD based on prior work-up, but details are not clear.  She does not report any reproducible exertional chest pain, has more atypical symptoms that she has noticed since her diagnosis of breast cancer.  She was referred for recent cardiac testing by Dr. Pleas Koch including an echocardiogram and a Myoview done at [facility]. Echocardiogram done on January 19, 2019 reported mild LVH with LVEF greater than [id number], mild diastolic dysfunction, mildly thickened aortic valve without stenosis, normal RV contraction.  No description of pericardial effusion which had been noted by a prior CT of the chest.  She also underwent a Lexiscan Myoview on January 29, 2019 which reported no evidence of scar or ischemia with LVEF 58%, low risk study.  I discussed these results with her today.  Although she did have some coronary artery calcifications by  chest CT imaging, the present evaluation would not suggest obstructive CAD or need for further cardiac testing at this time.  Past Medical History:  Diagnosis Date  . Breast cancer ([place])    Status post chemotherapy and XRT  . Essential hypertension   . Genital warts   . GERD (gastroesophageal reflux disease)   . History of anemia   . History of renal insufficiency   . Mixed hyperlipidemia   . Osteoarthritis   . Otitis media    right  . Sinus infection 01/04/14    Past Surgical History:  Procedure Laterality Date  . APPENDECTOMY    . BREAST LUMPECTOMY WITH RADIOACTIVE SEED AND SENTINEL LYMPH NODE BIOPSY Left 05/06/2018   Procedure: LEFT BREAST LUMPECTOMY WITH RADIOACTIVE SEED AND LEFT DEEP AXILLARY SENTINEL LYMPH NODE BIOPSY AND BLUE DYE INJECTION;  Surgeon: Fanny Skates, MD;  Location: [facility];  Service: General;  Laterality: Left;  . CATARACT EXTRACTION W/PHACO Left 01/10/2014   Procedure: CATARACT EXTRACTION PHACO AND INTRAOCULAR LENS PLACEMENT ; CDE:  4.94;  Surgeon: Williams Che, MD;  Location: [facility];  Service: Ophthalmology;  Laterality: Left;  . CESAREAN SECTION    . CHOLECYSTECTOMY    . MYRINGOTOMY WITH TUBE PLACEMENT Right 02/04/2017   Procedure: REVISION OF RIGHT MYRINGOTOMY WITH TUBE PLACEMENT, WITH EXAM OF LEFT EAR;  Surgeon: Leta Baptist, MD;  Location: [facility];  Service: ENT;  Laterality: Right;  . NASAL SINUS SURGERY    . PORT-A-CATH REMOVAL N/A 01/06/2019   Procedure: REMOVAL PORT-A-CATH;  Surgeon: Fanny Skates, MD;  Location: [facility];  Service: General;  Laterality: N/A;  . PORTACATH PLACEMENT Right 06/16/2018   Procedure: INSERTION PORT-A-CATH;  Surgeon: Fanny Skates, MD;  Location: [facility];  Service: General;  Laterality: Right;  . TONSILLECTOMY      Current Outpatient Medications  Medication Sig Dispense Refill  . acyclovir (ZOVIRAX) 400 MG tablet Take 400 mg by mouth 2 (two) times  daily.     . cetirizine (ZYRTEC) 10 MG tablet Take 10 mg by mouth daily.    . Cholecalciferol (VITAMIN D) 2000 UNITS tablet Take 2,000 Units by mouth daily.    . ferrous sulfate 325 (65 FE) MG tablet Take 325 mg by mouth daily with breakfast.    . metoprolol succinate (TOPROL-XL) 25 MG 24 hr tablet Take 25 mg by mouth daily.  3  . Multiple Vitamins-Minerals (MULTIVITAMIN WITH MINERALS) tablet Take 1 tablet by mouth daily.    Marland Kitchen omeprazole (PRILOSEC) 40 MG capsule Take 40 mg by mouth daily.    Marland Kitchen zolpidem (AMBIEN) 10 MG tablet Take 10 mg by mouth at bedtime as needed for sleep.     No current facility-administered medications for this visit.   Facility-Administered Medications Ordered in Other Visits  Medication Dose Route Frequency Provider Last Rate Last Admin  . sodium chloride flush (NS) 0.9 % injection 10 mL  10 mL Intracatheter PRN Nicholas Lose, MD   10 mL at 08/04/18 1518   Allergies:  Eggs or egg-derived products, Other, and Penicillins   Social History: The patient  reports that she has never smoked. She has never used smokeless tobacco. She reports that she does not drink alcohol or use drugs.   Family History: The patient's family history includes Heart attack in her father; Lupus in her mother; Prostate cancer in her father.   ROS:  Please see the history of present illness. Otherwise, complete review of systems is positive for none.  All other systems are reviewed and negative.   Physical Exam: VS:  BP 122/78   Pulse 75   Ht 5\' 3"  (1.6 m)   Wt 120 lb (54.4 kg)   SpO2 99%   BMI 21.26 kg/m , BMI Body mass index is 21.26 kg/m.  Wt Readings from Last 3 Encounters:  02/04/19 120 lb (54.4 kg)  01/14/19 117 lb 4.8 oz (53.2 kg)  01/06/19 119 lb 14.9 oz (54.4 kg)    General: Patient appears comfortable at rest. HEENT: Conjunctiva and lids normal, wearing a mask. Neck: Supple, no elevated JVP or carotid bruits, no thyromegaly. Lungs: Clear to auscultation, nonlabored  breathing at rest. Cardiac: Regular rate and rhythm, no S3 or significant systolic murmur, no pericardial rub. Abdomen: Soft, nontender, bowel sounds present. Extremities: No pitting edema, distal pulses 2+. Skin: Warm and dry. Musculoskeletal: No kyphosis. Neuropsychiatric: Alert and oriented x3, affect grossly appropriate.  ECG:  An ECG dated 06/26/2017 was personally reviewed today and demonstrated:  Sinus rhythm.  Recent Labwork: 08/25/2018: ALT 15; AST 18; BUN 8; Creatinine 0.73; Hemoglobin 10.1; Platelet Count 354; Potassium 3.8; Sodium 140   Other Studies Reviewed Today:  Echocardiogram 01/19/2019 [facility]): Summary  1. The left ventricle is normal in size with mildly increased wall thickness.  2. The left ventricular systolic function is normal, LVEF is visually estimated at > 55%.  3. There is grade I diastolic dysfunction (impaired relaxation).  4. The aortic valve is trileaflet with mildly thickened leaflets with normal excursion.  5. The right ventricle is normal in  size, with normal systolic function.   Left Ventricle  The left ventricle is normal in size with mildly increased wall thickness.  The left ventricular systolic function is normal, LVEF is visually estimated at > 55%.  There is grade I diastolic dysfunction (impaired relaxation).  LV global longitudinal strain: -15.4 %.  Right Ventricle  The right ventricle is normal in size, with normal systolic function.  Left Atrium  The left atrium is normal in size.  Right Atrium  The right atrium is normal in size.  Aortic Valve  The aortic valve is trileaflet with mildly thickened leaflets with normal excursion.  There is no significant aortic regurgitation.  There is no evidence of a significant transvalvular gradient.  Pulmonic Valve  The pulmonic valve is normal.  There is no significant pulmonic regurgitation.  There is no evidence of a significant transvalvular  gradient.  Mitral Valve  The mitral valve leaflets are normal with normal leaflet mobility.  There is no significant mitral valve regurgitation.  Tricuspid Valve  The tricuspid valve leaflets are normal, with normal leaflet mobility.  There is no significant tricuspid regurgitation.  Pulmonary systolic pressure cannot be estimated due to insufficient TR jet.  Pericardium/Pleural  There is no pericardial effusion.  Inferior Vena Cava  IVC size and inspiratory change suggest normal right atrial pressure. (0-5 mmHg).  Aorta  The aorta is normal in size in the visualized segments.  Assessment and Plan:  1.  Abnormal ECG as outlined above, noted incidentally per anesthesia evaluation prior to Port-A-Cath removal in December 2020.  She does not describe any anginal type chest pain, has atypical symptoms that she states are most noticeable since her diagnosis and treatment for left breast cancer.  She does have coronary artery calcifications by chest CT imaging, however recent cardiac testing arranged by Dr. Pleas Koch is overall reassuring.  She has normal LVEF, no residual pericardial effusion by echocardiography, and no evidence of scar or ischemia by Myoview.  I would suggest risk factor modification strategies, specifically keeping blood pressure in control and maintaining follow-up of lipids with goal LDL 70 or less.  No additional cardiac testing is planned at this time, she should keep follow-up with Dr. Pleas Koch for now.  We discussed warning signs and symptoms that might prompt further cardiac assessment.  2.  Essential hypertension by history, currently on Toprol-XL.  Systolic is in the [id number] today.  Medication Adjustments/Labs and Tests Ordered: Current medicines are reviewed at length with the patient today.  Concerns regarding medicines are outlined above.   Tests Ordered: Orders Placed This Encounter  Procedures  . EKG 12-Lead    Medication Changes: No orders of the  defined types were placed in this encounter.   Disposition:  Follow up prn  Signed, Satira Sark, MD, [facility] 02/04/2019 9:29 AM    [facility] [place], [place], [place] [address] Phone: [phone]; Fax: [phone]

## 2019-02-10 DIAGNOSIS — C50212 Malignant neoplasm of upper-inner quadrant of left female breast: Secondary | ICD-10-CM | POA: Diagnosis not present

## 2019-02-10 DIAGNOSIS — C50912 Malignant neoplasm of unspecified site of left female breast: Secondary | ICD-10-CM | POA: Diagnosis not present

## 2019-02-10 DIAGNOSIS — Z170 Estrogen receptor positive status [ER+]: Secondary | ICD-10-CM | POA: Diagnosis not present

## 2019-02-11 ENCOUNTER — Other Ambulatory Visit: Payer: Self-pay

## 2019-02-11 ENCOUNTER — Encounter (HOSPITAL_COMMUNITY): Payer: Self-pay

## 2019-02-11 ENCOUNTER — Inpatient Hospital Stay: Payer: Medicare HMO | Attending: Hematology and Oncology

## 2019-02-11 ENCOUNTER — Ambulatory Visit (HOSPITAL_COMMUNITY)
Admission: RE | Admit: 2019-02-11 | Discharge: 2019-02-11 | Disposition: A | Discharge status: Home / Self Care | Payer: Medicare HMO | Source: Ambulatory Visit | Attending: Hematology and Oncology | Admitting: Hematology and Oncology

## 2019-02-11 DIAGNOSIS — C50212 Malignant neoplasm of upper-inner quadrant of left female breast: Secondary | ICD-10-CM | POA: Diagnosis not present

## 2019-02-11 DIAGNOSIS — Z170 Estrogen receptor positive status [ER+]: Secondary | ICD-10-CM | POA: Insufficient documentation

## 2019-02-11 DIAGNOSIS — K449 Diaphragmatic hernia without obstruction or gangrene: Secondary | ICD-10-CM | POA: Diagnosis not present

## 2019-02-11 DIAGNOSIS — C50919 Malignant neoplasm of unspecified site of unspecified female breast: Secondary | ICD-10-CM | POA: Diagnosis not present

## 2019-02-11 LAB — CMP (CANCER CENTER ONLY)
ALT: 9 U/L (ref 0–44)
AST: 17 U/L (ref 15–41)
Albumin: 3.7 g/dL (ref 3.5–5.0)
Alkaline Phosphatase: 107 U/L (ref 38–126)
Anion gap: 9 (ref 5–15)
BUN: 13 mg/dL (ref 8–23)
CO2: 28 mmol/L (ref 22–32)
Calcium: 9.3 mg/dL (ref 8.9–10.3)
Chloride: 107 mmol/L (ref 98–111)
Creatinine: 0.8 mg/dL (ref 0.44–1.00)
GFR, Est AFR Am: >60 mL/min (ref >60)
GFR, Estimated: >60 mL/min (ref >60)
Glucose, Bld: 87 mg/dL (ref 70–99)
Potassium: 4.5 mmol/L (ref 3.5–5.1)
Sodium: 144 mmol/L (ref 135–145)
Total Bilirubin: 0.7 mg/dL (ref 0.3–1.2)
Total Protein: 7.3 g/dL (ref 6.5–8.1)

## 2019-02-11 LAB — CBC WITH DIFFERENTIAL (CANCER CENTER ONLY)
Abs Immature Granulocytes: 0.01 10*3/uL (ref 0.00–0.07)
Basophils Absolute: 0 10*3/uL (ref 0.0–0.1)
Basophils Relative: 1 %
Eosinophils Absolute: 0.4 10*3/uL (ref 0.0–0.5)
Eosinophils Relative: 8 %
HCT: 37.2 % (ref 36.0–46.0)
Hemoglobin: 11.8 g/dL — ABNORMAL LOW (ref 12.0–15.0)
Immature Granulocytes: 0 %
Lymphocytes Relative: 46 %
Lymphs Abs: 2.3 10*3/uL (ref 0.7–4.0)
MCH: 24.3 pg — ABNORMAL LOW (ref 26.0–34.0)
MCHC: 31.7 g/dL (ref 30.0–36.0)
MCV: 76.5 fL — ABNORMAL LOW (ref 80.0–100.0)
Monocytes Absolute: 0.4 10*3/uL (ref 0.1–1.0)
Monocytes Relative: 8 %
Neutro Abs: 1.9 10*3/uL (ref 1.7–7.7)
Neutrophils Relative %: 37 %
Platelet Count: 253 10*3/uL (ref 150–400)
RBC: 4.86 MIL/uL (ref 3.87–5.11)
RDW: 18.8 % — ABNORMAL HIGH (ref 11.5–15.5)
WBC Count: 5.1 10*3/uL (ref 4.0–10.5)
nRBC: 0 % (ref 0.0–0.2)

## 2019-02-11 IMAGING — CT CT CHEST W/ CM
2 of 5 series · 12 of 36 positions shown, 15 images · IV contrast (OMNIPAQUE)
Comparison: CT chest [DATE]. CT angio chest, abdomen and pelvis
[DATE]

CLINICAL DATA: Restaging breast cancer. Patient complains of back
pain.

EXAM:
CT CHEST, ABDOMEN, AND PELVIS WITH CONTRAST
TECHNIQUE: Multidetector CT imaging of the chest, abdomen and pelvis was
performed following the standard protocol during bolus
administration of intravenous contrast.
CONTRAST:  100mL OMNIPAQUE IOHEXOL 300 MG/ML  SOLN

[Series 2: cap with · axial · 0.64mm/px · z∈[-452,+28]mm · 9 of 120 slices shown, 12 images]
[im 12/120  mediastinal]
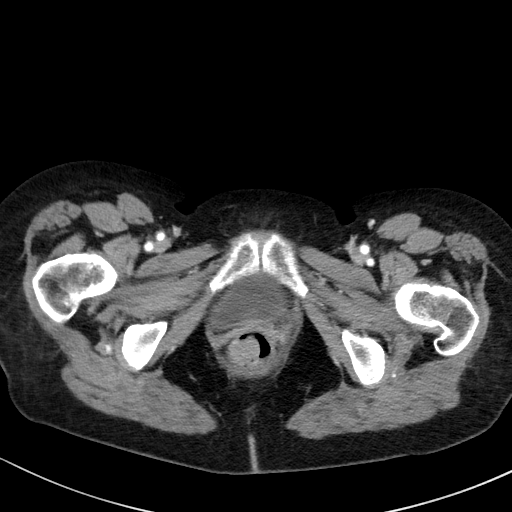
[im 12/120  lung]
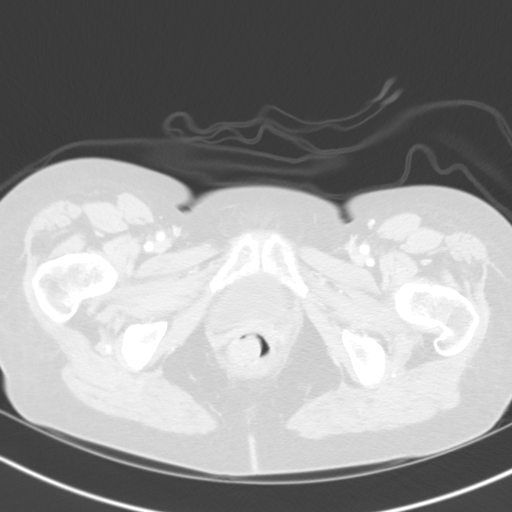
[im 24/120  lung]
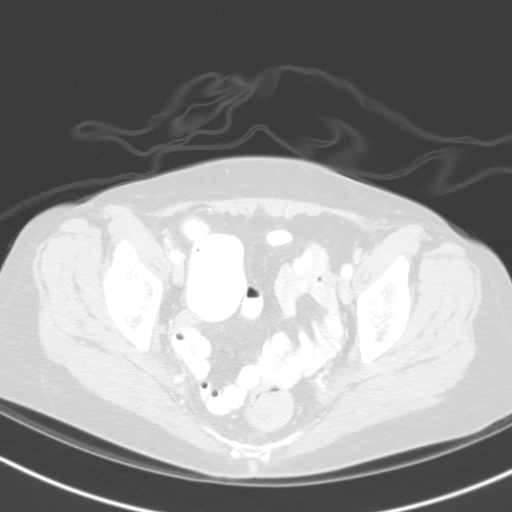
[im 36/120  lung]
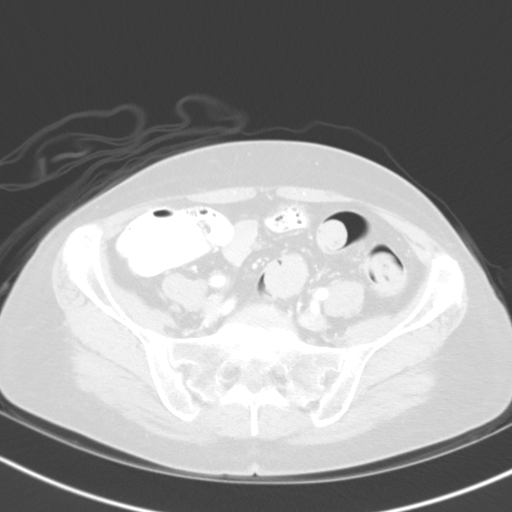
[im 48/120  lung]
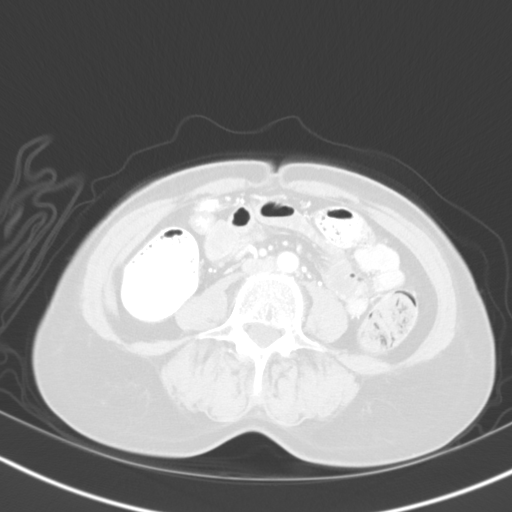
[im 60/120  mediastinal]
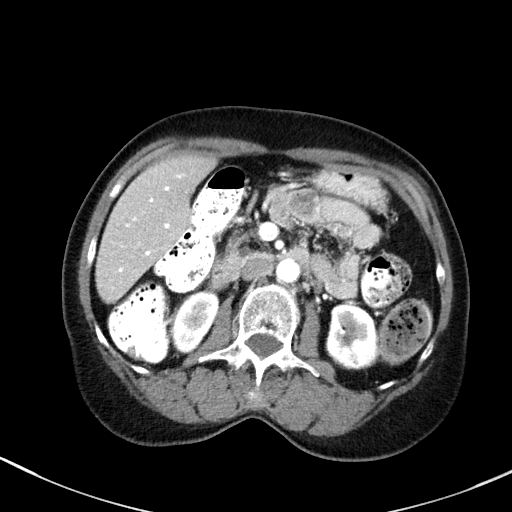
[im 60/120  lung]
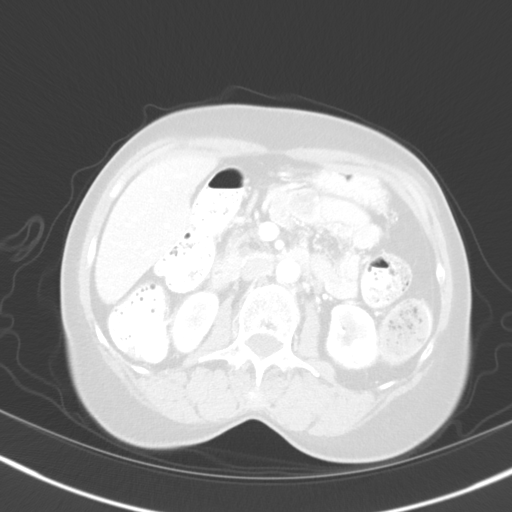
[im 72/120  lung]
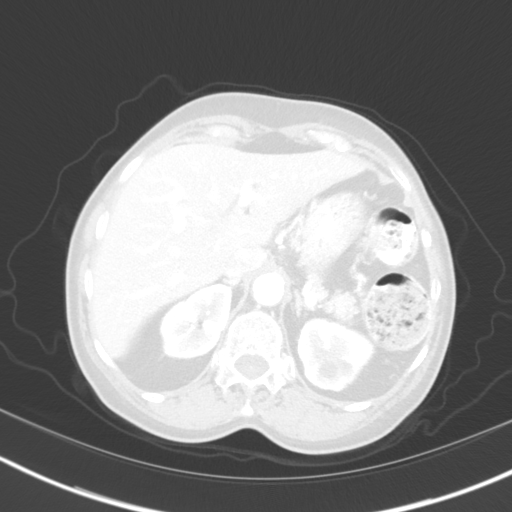
[im 84/120  lung]
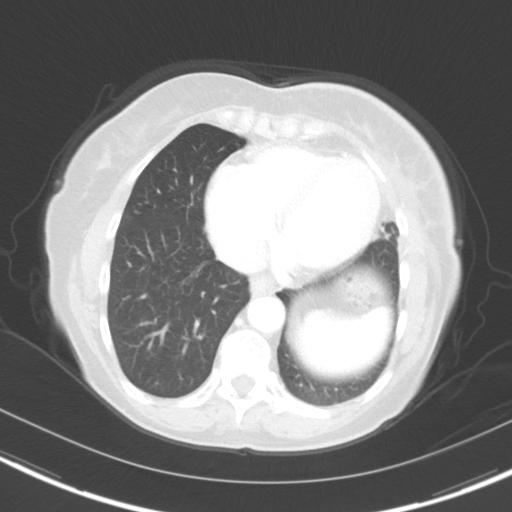
[im 96/120  lung]
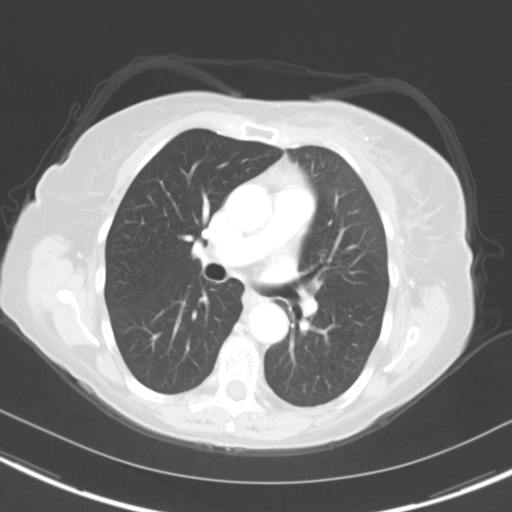
[im 108/120  mediastinal]
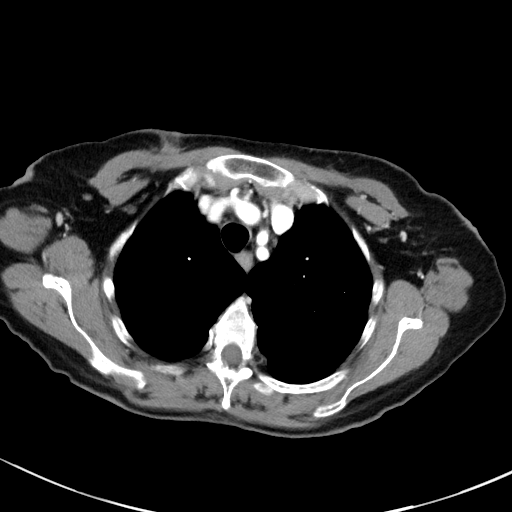
[im 108/120  lung]
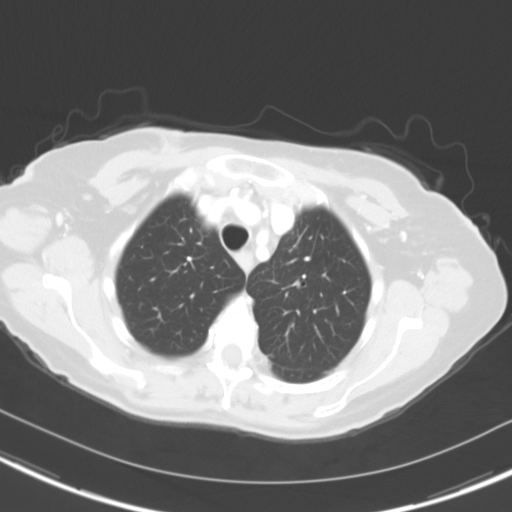

[Series 5: coronals · coronal · 0.64mm/px · 3 of 131 slices shown]
[im 27/131  lung]
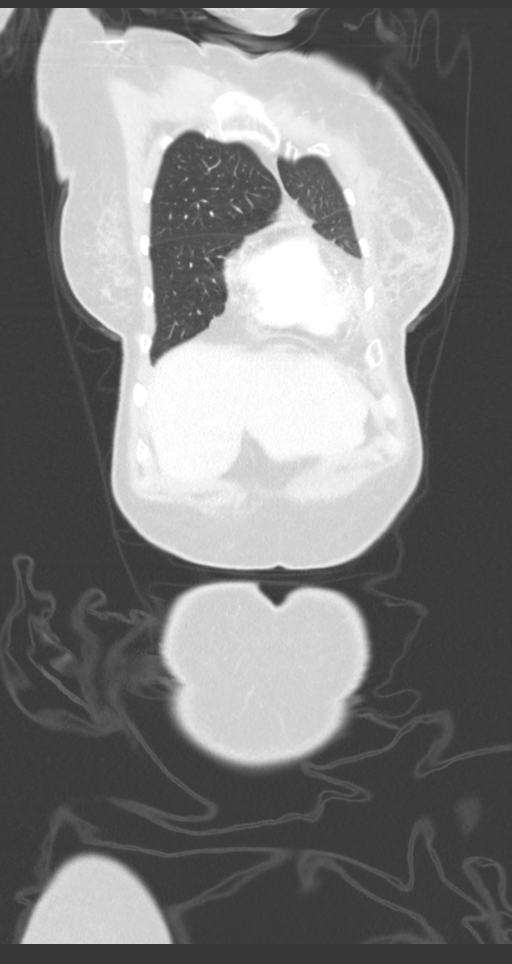
[im 53/131  lung]
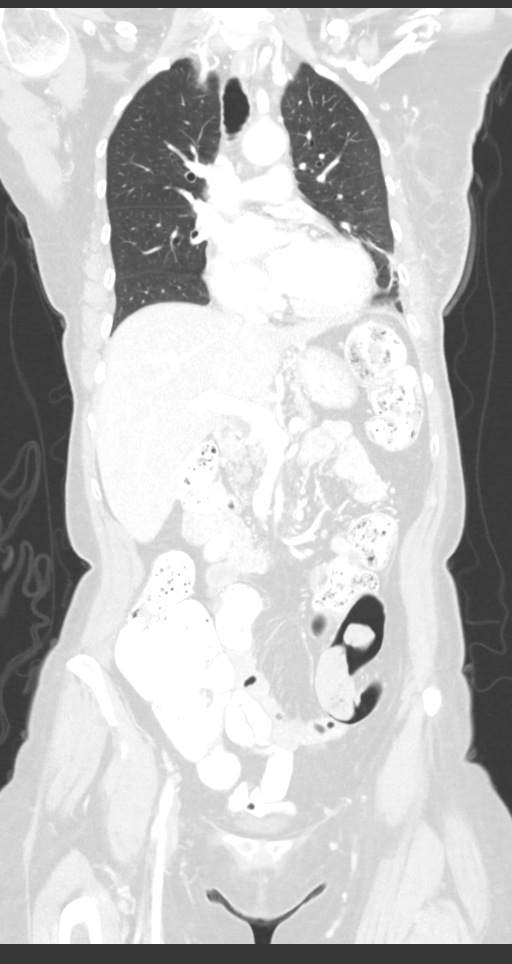
[im 79/131  lung]
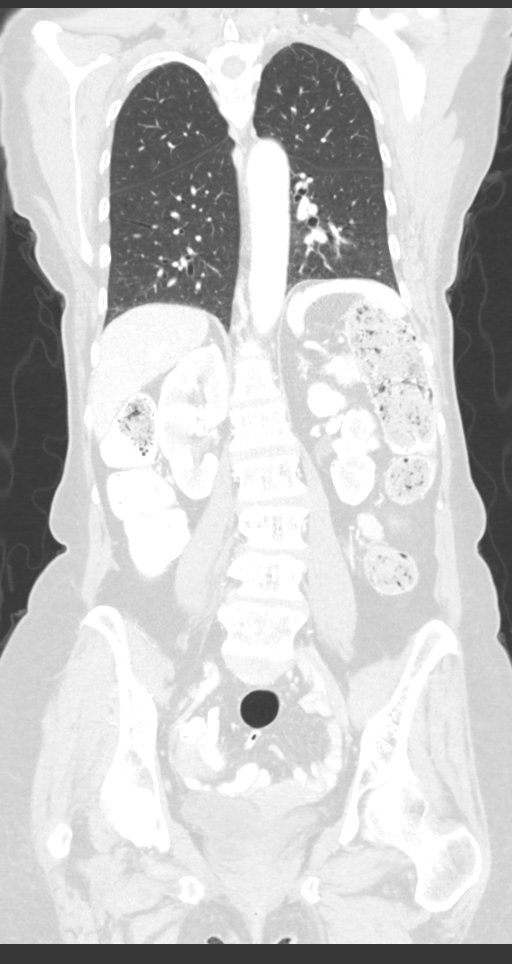

[12 of 36 positions shown; findings below may reference images not displayed]

FINDINGS: CT CHEST FINDINGS

Cardiovascular: The heart size appears within normal limits. No
pericardial effusion identified.

Mediastinum/Nodes: No enlarged mediastinal, hilar, or axillary lymph
nodes. Thyroid gland, trachea, and esophagus demonstrate no
significant findings.

Lungs/Pleura: There is no pleural effusion identified.No airspace
consolidation, atelectasis, or pneumothorax. Subpleural nodule
overlying the lateral left lower lobe measures 3 mm. Unchanged from
previous exam. Calcified granuloma within the superior segment of
right lower lobe.

Musculoskeletal: No aggressive lytic or sclerotic bone lesions
identified.

CT ABDOMEN PELVIS FINDINGS

Hepatobiliary: Several small low-attenuation foci are identified
within the left lobe of liver, unchanged from previous exam. No
suspicious liver lesions identified. Cholecystectomy. CBD is upper
limits of normal in caliber measuring 6 mm.

Pancreas: Unremarkable. No pancreatic ductal dilatation or
surrounding inflammatory changes.

Spleen: Normal in size without focal abnormality.

Adrenals/Urinary Tract: The adrenal glands appear normal. 1.1 cm
cyst within upper pole of left kidney noted. Additional
low-attenuation lesions in both kidneys measure less than 1 cm and
are too small to reliably characterize. No hydronephrosis identified
bilaterally. The urinary bladder appears normal.

Stomach/Bowel: Small hiatal hernia. No abnormal bowel wall
thickening, inflammation or distension. A moderate stool burden is
identified throughout the colon.

Vascular/Lymphatic: Aortic atherosclerosis. No abdominopelvic
adenopathy

Reproductive: Small calcifications identified within the uterus. No
discrete mass. No adnexal mass.

Other: No free fluid or fluid collections.

Musculoskeletal: No acute or significant osseous findings.
IMPRESSION: 1. No acute findings within the chest abdomen or pelvis. No specific
findings identified to suggest metastatic disease.
2. Small hiatal hernia.

## 2019-02-11 MED ORDER — SODIUM CHLORIDE (PF) 0.9 % IJ SOLN
INTRAMUSCULAR | Status: AC
  Filled 2019-02-11: qty 50

## 2019-02-11 MED ORDER — IOHEXOL 300 MG/ML  SOLN
100.0000 mL | Freq: Once | INTRAMUSCULAR | Status: AC | PRN
  Administered 2019-02-11: 100 mL via INTRAVENOUS

## 2019-02-14 ENCOUNTER — Emergency Department (HOSPITAL_COMMUNITY): Payer: Medicare HMO

## 2019-02-14 ENCOUNTER — Emergency Department (HOSPITAL_COMMUNITY)
Admission: EM | Admit: 2019-02-14 | Discharge: 2019-02-14 | Disposition: A | Discharge status: Home / Self Care | Payer: Medicare HMO | Attending: Emergency Medicine | Admitting: Emergency Medicine

## 2019-02-14 ENCOUNTER — Encounter (HOSPITAL_COMMUNITY): Payer: Self-pay | Admitting: *Deleted

## 2019-02-14 ENCOUNTER — Other Ambulatory Visit: Payer: Self-pay

## 2019-02-14 DIAGNOSIS — I10 Essential (primary) hypertension: Secondary | ICD-10-CM | POA: Insufficient documentation

## 2019-02-14 DIAGNOSIS — R519 Headache, unspecified: Secondary | ICD-10-CM | POA: Diagnosis present

## 2019-02-14 DIAGNOSIS — Z79899 Other long term (current) drug therapy: Secondary | ICD-10-CM | POA: Insufficient documentation

## 2019-02-14 DIAGNOSIS — Z853 Personal history of malignant neoplasm of breast: Secondary | ICD-10-CM | POA: Diagnosis not present

## 2019-02-14 DIAGNOSIS — Z9221 Personal history of antineoplastic chemotherapy: Secondary | ICD-10-CM | POA: Insufficient documentation

## 2019-02-14 DIAGNOSIS — R6884 Jaw pain: Secondary | ICD-10-CM | POA: Insufficient documentation

## 2019-02-14 DIAGNOSIS — R220 Localized swelling, mass and lump, head: Secondary | ICD-10-CM | POA: Diagnosis not present

## 2019-02-14 DIAGNOSIS — R0989 Other specified symptoms and signs involving the circulatory and respiratory systems: Secondary | ICD-10-CM | POA: Diagnosis not present

## 2019-02-14 LAB — BASIC METABOLIC PANEL
Anion gap: 8 (ref 5–15)
BUN: 13 mg/dL (ref 8–23)
CO2: 27 mmol/L (ref 22–32)
Calcium: 9 mg/dL (ref 8.9–10.3)
Chloride: 107 mmol/L (ref 98–111)
Creatinine, Ser: 0.73 mg/dL (ref 0.44–1.00)
GFR calc Af Amer: >60 mL/min (ref >60)
GFR calc non Af Amer: >60 mL/min (ref >60)
Glucose, Bld: 99 mg/dL (ref 70–99)
Potassium: 3.9 mmol/L (ref 3.5–5.1)
Sodium: 142 mmol/L (ref 135–145)

## 2019-02-14 LAB — TROPONIN I (HIGH SENSITIVITY): Troponin I (High Sensitivity): <2 ng/L (ref <18)

## 2019-02-14 LAB — CBC WITH DIFFERENTIAL/PLATELET
Abs Immature Granulocytes: 0.01 10*3/uL (ref 0.00–0.07)
Basophils Absolute: 0 10*3/uL (ref 0.0–0.1)
Basophils Relative: 1 %
Eosinophils Absolute: 0.4 10*3/uL (ref 0.0–0.5)
Eosinophils Relative: 6 %
HCT: 36.9 % (ref 36.0–46.0)
Hemoglobin: 11.6 g/dL — ABNORMAL LOW (ref 12.0–15.0)
Immature Granulocytes: 0 %
Lymphocytes Relative: 38 %
Lymphs Abs: 2.6 10*3/uL (ref 0.7–4.0)
MCH: 25.2 pg — ABNORMAL LOW (ref 26.0–34.0)
MCHC: 31.4 g/dL (ref 30.0–36.0)
MCV: 80 fL (ref 80.0–100.0)
Monocytes Absolute: 0.5 10*3/uL (ref 0.1–1.0)
Monocytes Relative: 7 %
Neutro Abs: 3.3 10*3/uL (ref 1.7–7.7)
Neutrophils Relative %: 48 %
Platelets: 268 10*3/uL (ref 150–400)
RBC: 4.61 MIL/uL (ref 3.87–5.11)
RDW: 19.3 % — ABNORMAL HIGH (ref 11.5–15.5)
WBC: 6.8 10*3/uL (ref 4.0–10.5)
nRBC: 0 % (ref 0.0–0.2)

## 2019-02-14 IMAGING — DX DG CHEST 1V PORT
2 series · 2 of 2 positions shown · non-contrast
Comparison: Portable exam [BJ] hours compared to [DATE]

CLINICAL DATA: LEFT facial swelling since yesterday, history LEFT
breast cancer, hypertension

EXAM:
PORTABLE CHEST 1 VIEW

[chest ap (1 of 2)]
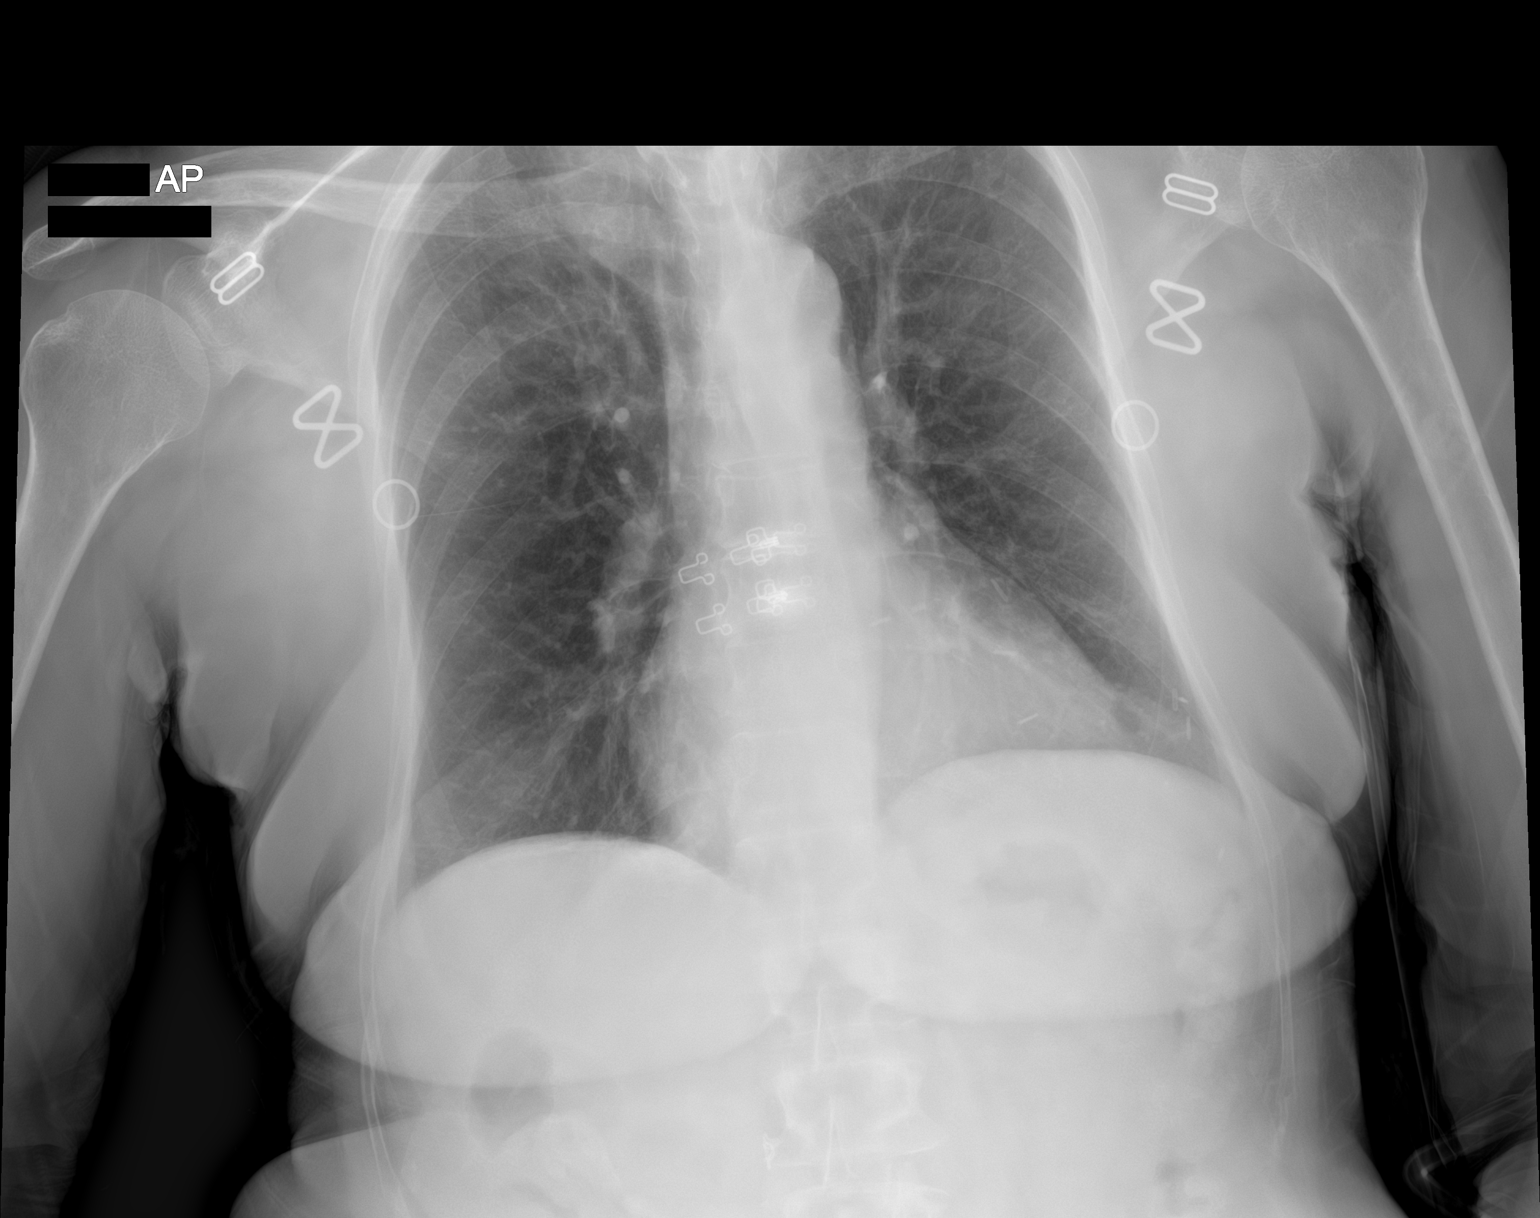

[chest ap (2 of 2)]
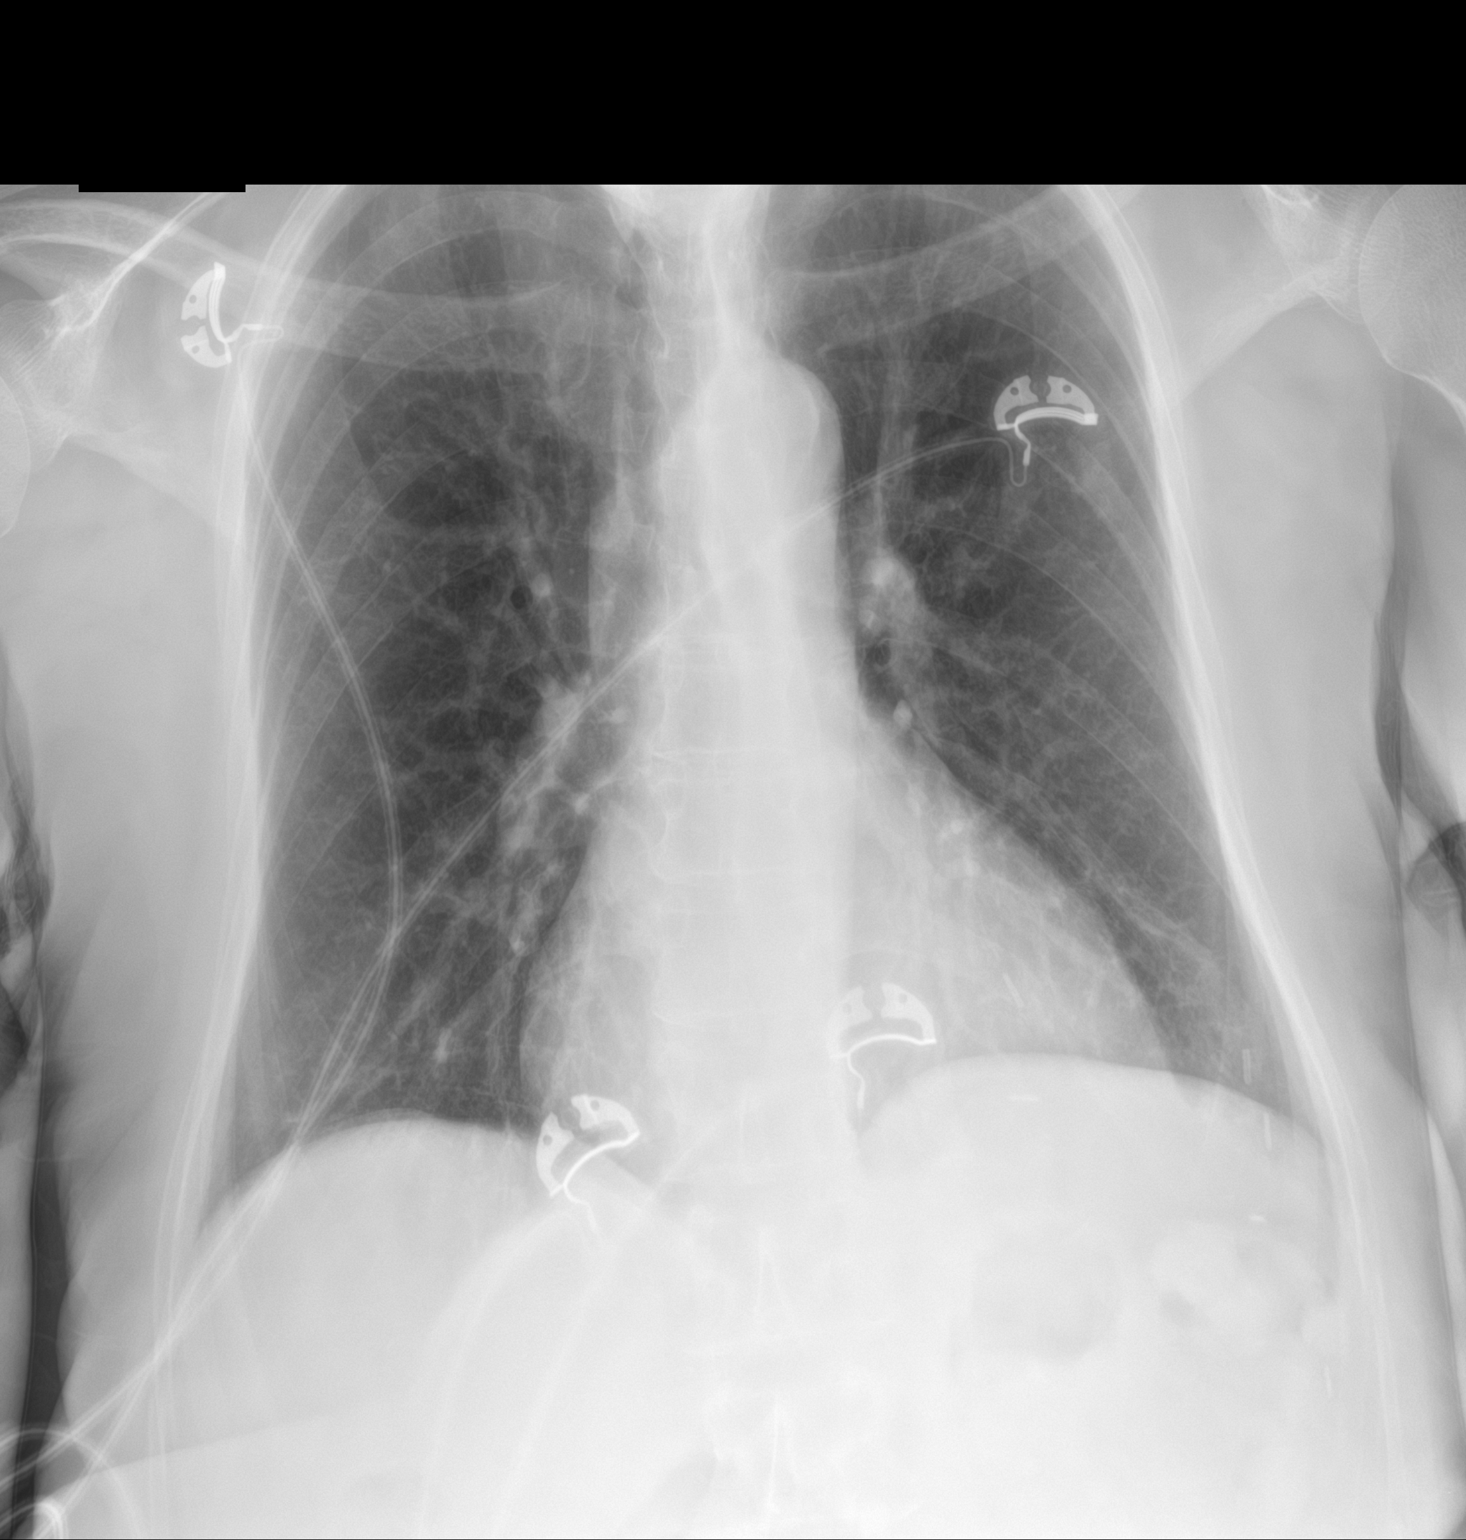

[2 of 2 positions shown; findings below may reference images not displayed]

FINDINGS: Enlargement of cardiac silhouette with pulmonary vascular
congestion.

Mediastinal contours normal.

Lungs clear.

No infiltrate, pleural effusion or pneumothorax.

Bones demineralized.
IMPRESSION: Minimal enlargement of cardiac silhouette with pulmonary vascular
congestion.

No acute abnormalities.

## 2019-02-14 MED ORDER — NAPROXEN 500 MG PO TABS: 500.0000 mg | ORAL_TABLET | Freq: Two times a day (BID) | ORAL | 0 refills | Status: DC

## 2019-02-14 MED ORDER — IBUPROFEN 400 MG PO TABS
400.0000 mg | ORAL_TABLET | Freq: Once | ORAL | Status: AC
  Administered 2019-02-14: 19:00:00 400 mg via ORAL
  Filled 2019-02-14: qty 1

## 2019-02-14 NOTE — ED Provider Notes (Signed)
Reading Provider Note   CSN: [id number] Arrival date & time: 02/14/19  1643     History Chief Complaint  Patient presents with  . Facial Swelling    Kimberly Mccormick is a 69 y.o. female.  HPI      Kimberly Mccormick is a 69 y.o. female who presents to the Emergency Department complaining of left-sided facial pain and swelling that began yesterday.  She states that she was eating a salad when she noticed a sharp pain to her left lower jaw.  She states today the pain has radiated to her neck and she had a brief episode in which the pain radiated into her left arm and she noticed swelling at her lower jaw near the left ear. Arm pain has since resolved.  Pain was not associated with nausea, vomiting, diaphoresis, chest pain or shortness of breath.  Her pain is worse with opening and closing her mouth and rotation of her head.  She is not taking any medications for symptomatic relief.  She denies any previous heart disease.  She reports history of TMJ issues states her current pain feels similar to previous.  She does wear dentures, she denies any pain with her gums or lesions of her mouth.    Past Medical History:  Diagnosis Date  . Breast cancer ([place])    Status post chemotherapy and XRT  . Essential hypertension   . Genital warts   . GERD (gastroesophageal reflux disease)   . History of anemia   . History of renal insufficiency   . Mixed hyperlipidemia   . Osteoarthritis   . Otitis media    right  . Sinus infection 01/04/14    Patient Active Problem List   Diagnosis Date Noted  . Port-A-Cath in place 06/23/2018  . Malignant neoplasm of upper-inner quadrant of left breast in female, estrogen receptor positive ([place]) 04/16/2018  . Essential hypertension 03/21/2015  . High cholesterol 03/21/2015  . Genital herpes 03/21/2015  . Fecal urgency 03/21/2015  . GERD (gastroesophageal reflux disease) 03/21/2015    Past Surgical History:  Procedure Laterality  Date  . APPENDECTOMY    . BREAST LUMPECTOMY WITH RADIOACTIVE SEED AND SENTINEL LYMPH NODE BIOPSY Left 05/06/2018   Procedure: LEFT BREAST LUMPECTOMY WITH RADIOACTIVE SEED AND LEFT DEEP AXILLARY SENTINEL LYMPH NODE BIOPSY AND BLUE DYE INJECTION;  Surgeon: Fanny Skates, MD;  Location: [place];  Service: General;  Laterality: Left;  . CATARACT EXTRACTION W/PHACO Left 01/10/2014   Procedure: CATARACT EXTRACTION PHACO AND INTRAOCULAR LENS PLACEMENT ; CDE:  4.94;  Surgeon: Williams Che, MD;  Location: [facility];  Service: Ophthalmology;  Laterality: Left;  . CESAREAN SECTION    . CHOLECYSTECTOMY    . MYRINGOTOMY WITH TUBE PLACEMENT Right 02/04/2017   Procedure: REVISION OF RIGHT MYRINGOTOMY WITH TUBE PLACEMENT, WITH EXAM OF LEFT EAR;  Surgeon: Leta Baptist, MD;  Location: [place];  Service: ENT;  Laterality: Right;  . NASAL SINUS SURGERY    . PORT-A-CATH REMOVAL N/A 01/06/2019   Procedure: REMOVAL PORT-A-CATH;  Surgeon: Fanny Skates, MD;  Location: [place];  Service: General;  Laterality: N/A;  . PORTACATH PLACEMENT Right 06/16/2018   Procedure: INSERTION PORT-A-CATH;  Surgeon: Fanny Skates, MD;  Location: [place];  Service: General;  Laterality: Right;  . TONSILLECTOMY       OB History   No obstetric history on file.     Family History  Problem Relation Age  of Onset  . Lupus Mother   . Heart attack Father   . Prostate cancer Father     Social History   Tobacco Use  . Smoking status: Never Smoker  . Smokeless tobacco: Never Used  Substance Use Topics  . Alcohol use: No  . Drug use: No    Home Medications Prior to Admission medications   Medication Sig Start Date End Date Taking? Authorizing Provider  acyclovir (ZOVIRAX) 400 MG tablet Take 400 mg by mouth 2 (two) times daily.     [provider]  cetirizine (ZYRTEC) 10 MG tablet Take 10 mg by mouth daily.    [provider]   Cholecalciferol (VITAMIN D) 2000 UNITS tablet Take 2,000 Units by mouth daily.    [provider]  ferrous sulfate 325 (65 FE) MG tablet Take 325 mg by mouth daily with breakfast.    [provider]  metoprolol succinate (TOPROL-XL) 25 MG 24 hr tablet Take 25 mg by mouth daily. 05/28/17   [provider]  Multiple Vitamins-Minerals (MULTIVITAMIN WITH MINERALS) tablet Take 1 tablet by mouth daily.    [provider]  omeprazole (PRILOSEC) 40 MG capsule Take 40 mg by mouth daily.    [provider]  zolpidem (AMBIEN) 10 MG tablet Take 10 mg by mouth at bedtime as needed for sleep.    [provider]    Allergies    Eggs or egg-derived products, Other, and Penicillins  Review of Systems   Review of Systems  Constitutional: Negative for appetite change, diaphoresis and fever.  HENT: Negative for dental problem.        Left jaw pain and swelling  Respiratory: Negative for cough, chest tightness and shortness of breath.   Cardiovascular: Negative for chest pain.  Gastrointestinal: Negative for abdominal pain, nausea and vomiting.  Genitourinary: Negative for dysuria and flank pain.  Musculoskeletal: Positive for neck pain.  Skin: Negative for color change, rash and wound.  Neurological: Negative for dizziness, facial asymmetry, weakness, numbness and headaches.    Physical Exam Updated Vital Signs BP (!) 150/78 (BP Location: Left Arm)   Pulse 71   Temp 97.9 F (36.6 C) (Oral)   Resp 12   Ht 5\' 3"  (1.6 m)   Wt 54.4 kg   SpO2 100%   BMI 21.26 kg/m   Physical Exam Vitals and nursing note reviewed.  Constitutional:      General: She is not in acute distress.    Appearance: Normal appearance.  HENT:     Head: Atraumatic.     Comments: ttp along the angle of the left mandible.  No appreciable edema of the jaw or face noted on exam.  Pt is edentulous, no oral lesions.      Right Ear: Tympanic membrane and ear canal normal.      Left Ear: Tympanic membrane and ear canal normal.     Nose: Nose normal.     Mouth/Throat:     Mouth: Mucous membranes are moist.     Dentition: No gingival swelling, dental abscesses or gum lesions.     Pharynx: Oropharynx is clear. Uvula midline. No uvula swelling.  Neck:     Trachea: Phonation normal.     Meningeal: Kernig's sign absent.  Cardiovascular:     Rate and Rhythm: Normal rate and regular rhythm.     Pulses: Normal pulses.  Pulmonary:     Effort: Pulmonary effort is normal.     Breath sounds: Normal breath sounds.  Chest:     Chest wall: No tenderness.  Musculoskeletal:        General: Normal range of motion.     Cervical back: Normal range of motion. Normal range of motion.  Lymphadenopathy:     Cervical: No cervical adenopathy.     Left cervical: No superficial, deep or posterior cervical adenopathy.  Skin:    General: Skin is warm.     Capillary Refill: Capillary refill takes less than 2 seconds.     Findings: No erythema or rash.  Neurological:     General: No focal deficit present.     Mental Status: She is alert.     Sensory: Sensation is intact. No sensory deficit.     Motor: Motor function is intact. No weakness.     Comments: CN II-XII grossly intact.  Speech clear, grip strength symmetrical.  No pronator drift.       ED Results / Procedures / Treatments   Labs (all labs ordered are listed, but only abnormal results are displayed) Labs Reviewed  CBC WITH DIFFERENTIAL/PLATELET - Abnormal; Notable for the following components:      Result Value   Hemoglobin 11.6 (*)    MCH 25.2 (*)    RDW 19.3 (*)    All other components within normal limits  BASIC METABOLIC PANEL  TROPONIN I (HIGH SENSITIVITY)    EKG EKG Interpretation  Date/Time:  Sunday February 14 2019 18:26:04 EST Ventricular Rate:  58 PR Interval:    QRS Duration: 72 QT Interval:  432 QTC Calculation: 425 R Axis:   55 Text Interpretation: Sinus rhythm Nonspecific T wave abnormality  No significant change since last tracing Confirmed by Lajean Saver [phone]) on 02/14/2019 6:33:11 PM   Radiology DG Chest Portable 1 View  Result Date: 02/14/2019 CLINICAL DATA:  LEFT facial swelling since yesterday, history LEFT breast cancer, hypertension EXAM: PORTABLE CHEST 1 VIEW COMPARISON:  Portable exam 1807 hours compared to 11/19/2018 FINDINGS: Enlargement of cardiac silhouette with pulmonary vascular congestion. Mediastinal contours normal. Lungs clear. No infiltrate, pleural effusion or pneumothorax. Bones demineralized. IMPRESSION: Minimal enlargement of cardiac silhouette with pulmonary vascular congestion. No acute abnormalities. Electronically Signed   By: Lavonia Dana M.D.   On: 02/14/2019 18:22    Procedures Procedures (including critical care time)  Medications Ordered in ED Medications - No data to display  ED Course  I have reviewed the triage vital signs and the nursing notes.  Pertinent labs & imaging results that were available during my care of the patient were reviewed by me and considered in my medical decision making (see chart for details).    MDM Rules/Calculators/A&P                      Patient with reported left jaw pain and swelling for 1 day.  Pain began while chewing.  She endorses a brief episode of pain that radiated into her left arm.  This has resolved prior to ER arrival.  Work-up this evening is reassuring.  Pain is present with palpation over the angle of the left mandible.  I do not appreciate any edema on my exam.  There is no oral lesions or concerning symptoms for abscess.  Low clinical suspicion for cardiac process.  Vitals reassuring.  Pt appears appropriate for d/c home.  Return precautions discussed.   Pt also seen by Dr. Ashok Cordia and care plan discussed.    Final Clinical Impression(s) / ED Diagnoses Final diagnoses:  Jaw pain  Rx / DC Orders ED Discharge Orders    None       Kem Parkinson, Hershal Coria 02/14/19 1940    Lajean Saver, MD 02/14/19 2209

## 2019-02-14 NOTE — ED Triage Notes (Signed)
Pt with left facial swelling since yesterday, wears dentures per pt.

## 2019-02-14 NOTE — Discharge Instructions (Addendum)
Follow-up with your doctor for recheck.  Soft food and liquids for 3 to 4 days.  Avoid chewing on your left side.  Follow-up with your primary doctor for recheck, return to the emergency department if you develop any worsening symptoms.

## 2019-02-15 NOTE — Progress Notes (Signed)
  HEMATOLOGY-ONCOLOGY TELEPHONE VISIT PROGRESS NOTE  I connected with [name] on 02/16/2019 at 10:00 AM EST by telephone and verified that I am speaking with the correct person using two identifiers.  I discussed the limitations, risks, security and privacy concerns of performing an evaluation and management service by telephone and the availability of in person appointments.  I also discussed with the patient that there may be a patient responsible charge related to this service. The patient expressed understanding and agreed to proceed.   History of Present Illness: Kimberly Mccormick is a 69 y.o. female with above-mentioned history of left breast cancer treated with lumpectomy, adjuvant chemotherapy, and radiation. CT CAP on 02/11/19 showed no evidence of metastatic disease. She presents over the phone today to discuss anti-estrogen therapy.   Oncology History  Malignant neoplasm of upper-inner quadrant of left breast in female, estrogen receptor positive ([place])  03/24/2018 Initial Diagnosis   Screening detected left breast mass 8 mm upper inner quadrant biopsy revealed grade 2 IDC with DCIS ER 95%, PR 0%, Ki-67 20%, HER-2 -1+ by IHC, T1 BN 0 stage Ia clinical stage   05/06/2018 Surgery   Lumpectomy Dalbert Batman): IDC with DCIS, 1.8cm, grade 2, ER+ (95%), PR-, HER2 negative (1+, IHC), Ki67 20%, clear margins, 4 SLN negative.    05/18/2018 Oncotype testing   Oncotype DX recurrence score 30: risk of distant recurrence at 9 years is 19%. Chemo benefit is >15%.    06/23/2018 -  Chemotherapy   Adjuvant chemotherapy with dose dense Adriamycin and Cytoxan x4 followed by Taxol weekly x12     Observations/Objective:     Assessment Plan:  Malignant neoplasm of upper-inner quadrant of left breast in female, estrogen receptor positive ([place]) 05/08/2018 lumpectomy Dalbert Batman): IDC with DCIS, 1.8cm, grade 2, ER+ (95%), PR-, HER2 negative (1+, IHC), Ki67 20%, clear margins, 4 SLN negative. Oncotype DX score 30:  Distant recurrence risk at 9 years 19% Treatment plan: 1. Adjuvant chemotherapy with Taxotere and Cytoxan x4 (patient refused) 2. Adjuvant radiation therapy at [facility] 3. Followed by adjuvant antiestrogen therapy: (Patient undecided) ---------------------------------------------------------------------------------------------------------------------------------------------- Treatment plan: I recommended anastrozole 1 mg p.o. daily x 5-7 years  Previously I provided her with all the information.  She firmly believes in prayer to answer her questions. Back pain issues: Probably arthritis CT chest abdomen pelvis 02/11/2019: No evidence of metastatic disease.  Neuropathy: Recommended gabapentin but she does not want to take it.  Anastrozole counseling: After lengthy discussion about the risks and benefits of anastrozole, patient has not decided yet.  She will call me with her decision later. Patient would like to get a [facility] at [facility].  I sent an order for that.  Return to clinic in 6 months for checkup  I discussed the assessment and treatment plan with the patient. The patient was provided an opportunity to ask questions and all were answered. The patient agreed with the plan and demonstrated an understanding of the instructions. The patient was advised to call back or seek an in-person evaluation if the symptoms worsen or if the condition fails to improve as anticipated.   I provided 15 minutes of non-face-to-face time during this encounter.   Rulon Eisenmenger, MD 02/16/2019    I, Molly Dorshimer, am acting as scribe for Nicholas Lose, MD.  I have reviewed the above documentation for accuracy and completeness, and I agree with the above.

## 2019-02-16 ENCOUNTER — Inpatient Hospital Stay (HOSPITAL_BASED_OUTPATIENT_CLINIC_OR_DEPARTMENT_OTHER): Payer: Medicare HMO | Admitting: Hematology and Oncology

## 2019-02-16 DIAGNOSIS — C50212 Malignant neoplasm of upper-inner quadrant of left female breast: Secondary | ICD-10-CM

## 2019-02-16 DIAGNOSIS — Z170 Estrogen receptor positive status [ER+]: Secondary | ICD-10-CM

## 2019-02-16 NOTE — Assessment & Plan Note (Signed)
05/08/2018 lumpectomy Dalbert Batman): IDC with DCIS, 1.8cm, grade 2, ER+ (95%), PR-, HER2 negative (1+, IHC), Ki67 20%, clear margins, 4 SLN negative. Oncotype DX score 30: Distant recurrence risk at 9 years 19% Treatment plan: 1. Adjuvant chemotherapy with Taxotere and Cytoxan x4 2. Adjuvant radiation therapy at [facility] 3. Followed by adjuvant antiestrogen therapy ---------------------------------------------------------------------------------------------------------------------------------------------- Treatment plan: I recommended anastrozole 1 mg p.o. daily x 5-7 years  Previously I provided her with all the information.  She firmly believes in prayer to answer her questions. Back pain issues: Probably arthritis CT chest abdomen pelvis 02/11/2019: No evidence of metastatic disease.  Neuropathy: Recommended gabapentin but she does not want to take it.  Anastrozole counseling: After lengthy discussion about the risks and benefits of anastrozole, patient decided  Return to clinic in 3 months for checkup

## 2019-02-17 ENCOUNTER — Telehealth: Payer: Self-pay | Admitting: Hematology and Oncology

## 2019-02-17 NOTE — Telephone Encounter (Signed)
I talk with patient regarding schedule  

## 2019-02-19 DIAGNOSIS — K219 Gastro-esophageal reflux disease without esophagitis: Secondary | ICD-10-CM | POA: Diagnosis not present

## 2019-02-19 DIAGNOSIS — I10 Essential (primary) hypertension: Secondary | ICD-10-CM | POA: Diagnosis not present

## 2019-04-01 DIAGNOSIS — K219 Gastro-esophageal reflux disease without esophagitis: Secondary | ICD-10-CM | POA: Diagnosis not present

## 2019-04-01 DIAGNOSIS — E782 Mixed hyperlipidemia: Secondary | ICD-10-CM | POA: Diagnosis not present

## 2019-04-01 DIAGNOSIS — I10 Essential (primary) hypertension: Secondary | ICD-10-CM | POA: Diagnosis not present

## 2019-04-08 DIAGNOSIS — D509 Iron deficiency anemia, unspecified: Secondary | ICD-10-CM | POA: Diagnosis not present

## 2019-04-08 DIAGNOSIS — I5032 Chronic diastolic (congestive) heart failure: Secondary | ICD-10-CM | POA: Diagnosis not present

## 2019-04-08 DIAGNOSIS — K219 Gastro-esophageal reflux disease without esophagitis: Secondary | ICD-10-CM | POA: Diagnosis not present

## 2019-04-08 DIAGNOSIS — Z6822 Body mass index (BMI) 22.0-22.9, adult: Secondary | ICD-10-CM | POA: Diagnosis not present

## 2019-04-08 DIAGNOSIS — T451X5A Adverse effect of antineoplastic and immunosuppressive drugs, initial encounter: Secondary | ICD-10-CM | POA: Diagnosis not present

## 2019-04-08 DIAGNOSIS — I10 Essential (primary) hypertension: Secondary | ICD-10-CM | POA: Diagnosis not present

## 2019-04-08 DIAGNOSIS — C50912 Malignant neoplasm of unspecified site of left female breast: Secondary | ICD-10-CM | POA: Diagnosis not present

## 2019-04-08 DIAGNOSIS — G620 Drug-induced polyneuropathy: Secondary | ICD-10-CM | POA: Diagnosis not present

## 2019-04-14 DIAGNOSIS — N6323 Unspecified lump in the left breast, lower outer quadrant: Secondary | ICD-10-CM | POA: Diagnosis not present

## 2019-04-14 DIAGNOSIS — R922 Inconclusive mammogram: Secondary | ICD-10-CM | POA: Diagnosis not present

## 2019-04-20 DIAGNOSIS — Z6822 Body mass index (BMI) 22.0-22.9, adult: Secondary | ICD-10-CM | POA: Diagnosis not present

## 2019-04-20 DIAGNOSIS — R109 Unspecified abdominal pain: Secondary | ICD-10-CM | POA: Diagnosis not present

## 2019-04-21 DIAGNOSIS — E7849 Other hyperlipidemia: Secondary | ICD-10-CM | POA: Diagnosis not present

## 2019-04-21 DIAGNOSIS — I10 Essential (primary) hypertension: Secondary | ICD-10-CM | POA: Diagnosis not present

## 2019-06-25 DIAGNOSIS — E559 Vitamin D deficiency, unspecified: Secondary | ICD-10-CM | POA: Diagnosis not present

## 2019-06-25 DIAGNOSIS — K219 Gastro-esophageal reflux disease without esophagitis: Secondary | ICD-10-CM | POA: Diagnosis not present

## 2019-06-25 DIAGNOSIS — N179 Acute kidney failure, unspecified: Secondary | ICD-10-CM | POA: Diagnosis not present

## 2019-06-25 DIAGNOSIS — R5383 Other fatigue: Secondary | ICD-10-CM | POA: Diagnosis not present

## 2019-06-25 DIAGNOSIS — I10 Essential (primary) hypertension: Secondary | ICD-10-CM | POA: Diagnosis not present

## 2019-06-29 DIAGNOSIS — I5032 Chronic diastolic (congestive) heart failure: Secondary | ICD-10-CM | POA: Diagnosis not present

## 2019-06-29 DIAGNOSIS — I10 Essential (primary) hypertension: Secondary | ICD-10-CM | POA: Diagnosis not present

## 2019-06-29 DIAGNOSIS — Z6823 Body mass index (BMI) 23.0-23.9, adult: Secondary | ICD-10-CM | POA: Diagnosis not present

## 2019-06-29 DIAGNOSIS — D509 Iron deficiency anemia, unspecified: Secondary | ICD-10-CM | POA: Diagnosis not present

## 2019-06-29 DIAGNOSIS — G620 Drug-induced polyneuropathy: Secondary | ICD-10-CM | POA: Diagnosis not present

## 2019-06-29 DIAGNOSIS — E782 Mixed hyperlipidemia: Secondary | ICD-10-CM | POA: Diagnosis not present

## 2019-06-29 DIAGNOSIS — M4310 Spondylolisthesis, site unspecified: Secondary | ICD-10-CM | POA: Diagnosis not present

## 2019-06-29 DIAGNOSIS — C50912 Malignant neoplasm of unspecified site of left female breast: Secondary | ICD-10-CM | POA: Diagnosis not present

## 2019-08-10 DIAGNOSIS — Z170 Estrogen receptor positive status [ER+]: Secondary | ICD-10-CM | POA: Diagnosis not present

## 2019-08-10 DIAGNOSIS — C50212 Malignant neoplasm of upper-inner quadrant of left female breast: Secondary | ICD-10-CM | POA: Diagnosis not present

## 2019-08-17 ENCOUNTER — Inpatient Hospital Stay: Payer: Medicare HMO | Attending: Hematology and Oncology | Admitting: Hematology and Oncology

## 2019-08-17 ENCOUNTER — Encounter: Payer: Self-pay | Admitting: *Deleted

## 2019-08-17 NOTE — Progress Notes (Signed)
RN successfully faxed mammogram order to [facility] [phone] be preformed within the next 2 weeks.

## 2019-08-17 NOTE — Assessment & Plan Note (Deleted)
05/08/2018 lumpectomy Kimberly Mccormick): IDC with DCIS, 1.8cm, grade 2, ER+ (95%), PR-, HER2 negative (1+, IHC), Ki67 20%, clear margins, 4 SLN negative. Oncotype DX score 30: Distant recurrence risk at 9 years 19% Treatment plan: 1. Adjuvant chemotherapy with Taxotere and Cytoxan x4 (patient refused) 2. Adjuvant radiation therapyat [place] 3. Followed by adjuvant antiestrogen therapy: (Patient undecided) ---------------------------------------------------------------------------------------------------------------------------------------------- Treatment plan: I recommended anastrozole 1 mg p.o. daily x 5-7years  Previously I provided her with all the information. She firmly believes in prayerto answer her questions. Back pain issues: Probably arthritis CT chest abdomen pelvis 02/11/2019: No evidence of metastatic disease.  Anastrozole counseling: After lengthy discussion about the risks and benefits of anastrozole, patient has not decided yet.  She will call me with her decision later. Patient would like to get a mamm[facility] at [facility].  I sent an order for that.  Return to clinic in 6 months for checkup

## 2019-09-23 DIAGNOSIS — D649 Anemia, unspecified: Secondary | ICD-10-CM | POA: Diagnosis not present

## 2019-09-23 DIAGNOSIS — D519 Vitamin B12 deficiency anemia, unspecified: Secondary | ICD-10-CM | POA: Diagnosis not present

## 2019-09-23 DIAGNOSIS — D529 Folate deficiency anemia, unspecified: Secondary | ICD-10-CM | POA: Diagnosis not present

## 2019-09-23 DIAGNOSIS — N179 Acute kidney failure, unspecified: Secondary | ICD-10-CM | POA: Diagnosis not present

## 2019-09-23 DIAGNOSIS — K219 Gastro-esophageal reflux disease without esophagitis: Secondary | ICD-10-CM | POA: Diagnosis not present

## 2019-09-23 DIAGNOSIS — I10 Essential (primary) hypertension: Secondary | ICD-10-CM | POA: Diagnosis not present

## 2019-10-06 DIAGNOSIS — E782 Mixed hyperlipidemia: Secondary | ICD-10-CM | POA: Diagnosis not present

## 2019-10-06 DIAGNOSIS — C50912 Malignant neoplasm of unspecified site of left female breast: Secondary | ICD-10-CM | POA: Diagnosis not present

## 2019-10-06 DIAGNOSIS — G250 Essential tremor: Secondary | ICD-10-CM | POA: Diagnosis not present

## 2019-10-06 DIAGNOSIS — G620 Drug-induced polyneuropathy: Secondary | ICD-10-CM | POA: Diagnosis not present

## 2019-10-06 DIAGNOSIS — M5136 Other intervertebral disc degeneration, lumbar region: Secondary | ICD-10-CM | POA: Diagnosis not present

## 2019-10-06 DIAGNOSIS — Z6823 Body mass index (BMI) 23.0-23.9, adult: Secondary | ICD-10-CM | POA: Diagnosis not present

## 2019-10-06 DIAGNOSIS — I5032 Chronic diastolic (congestive) heart failure: Secondary | ICD-10-CM | POA: Diagnosis not present

## 2019-10-06 DIAGNOSIS — I10 Essential (primary) hypertension: Secondary | ICD-10-CM | POA: Diagnosis not present

## 2019-12-14 ENCOUNTER — Encounter: Payer: Self-pay | Admitting: *Deleted

## 2019-12-15 ENCOUNTER — Ambulatory Visit: Payer: Medicare HMO | Admitting: Neurology

## 2019-12-20 ENCOUNTER — Encounter: Payer: Self-pay | Admitting: Neurology

## 2019-12-20 ENCOUNTER — Ambulatory Visit: Payer: Medicare HMO | Admitting: Neurology

## 2019-12-20 ENCOUNTER — Telehealth: Payer: Self-pay

## 2019-12-20 VITALS — BP 164/96 | HR 61 | Ht 64.0 in | Wt 137.0 lb

## 2019-12-20 DIAGNOSIS — G20 Parkinson's disease: Secondary | ICD-10-CM

## 2019-12-20 NOTE — Patient Instructions (Signed)
I think you have signs and symptoms of mild parkinsonism, possibly mild or early left sided Parkinson's disease.   Please continue with your healthy lifestyle, try to exercise on a regular basis.  Please also try to hydrate well with water.  Remember to drink plenty of fluid at least 6 glasses (8 oz each), eat healthy meals and do not skip any meals. Try to eat protein with a every meal and eat a healthy snack such as fruit or nuts in between meals. Try to keep a regular sleep-wake schedule and try to exercise daily, particularly in the form of walking, 20-30 minutes a day, if you can.   Try to stay active physically and mentally. Engage in social activities in your community and with your family and try to keep up with current events by reading the newspaper or watching the news. Try to do word puzzles and you may like to do puzzles and brain games on the computer such as on [email or web address]/.   As far as diagnostic testing, I will order a DaT scan: This is a specialized brain scan designed to help with diagnosis of tremor disorders. A radioactive marker gets injected and the uptake is measured in the brain and compared to normal controls and right side is compared to the left, a change in uptake can help with diagnosis of certain tremor disorders. A brain MRI on the other hand is a brain scan that helps look at the brain structure in more detail overall and look for age-related changes, blood vessel related changes and look for stroke and volume loss which we call atrophy.   I would like to see you back after the scan and we will pick up our discussion about medication options.    Our phone number is [phone]. We also have an after hours call service for urgent matters and there is a physician on-call for urgent questions, that cannot wait till the next work day. For any emergencies you know to call 911 or go to the nearest emergency room.

## 2019-12-20 NOTE — Telephone Encounter (Signed)
Obtained [employer] insurance auth consent from pt. Given to [facility] for processing.

## 2019-12-20 NOTE — Progress Notes (Signed)
Subjective:    Patient ID: Kimberly Mccormick is a 69 y.o. female.  HPI     Star Age, MD, PhD [facility] [address] [place], [place] [address]   Dear Dr. Pleas Koch,   I saw your patient, Kimberly Mccormick, upon your kind request, in my neurologica clinic today for initial consultation of her tremor.  The patient is accompanied by her son today.  As you know, [name] is a 69 year old right-handed woman with an underlying medical history of hypertension, hyperlipidemia, reflux disease, anemia, degenerative lumbar disc disease, breast cancer with status post lumpectomy in 2020, chemotherapy, XRT, history of neuropathy, and insomnia, who reports a 2 to 93-month history of intermittent left hand tremors. Her son, her tremor may have been ongoing for about 5 to 6 months as per his recollection. She notices the tremor while sitting at rest and also when writing with her right hand, her tremor exacerbates on the left side. She does not have much in the way of right hand tremor and is not particularly bothered by the tremor. She denies any balance issues or falls. Later on in our discussion she does admit to other symptoms including some posture changes with leaning over more than usual and she has noticed that her voice becomes weaker or softer as the day progresses. She has a paternal aunt who had Parkinson's disease. Both parents lived to be 47 years old. She lives with her son, she has another son. She is separated. 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 She is a non-smoker and does not utilize alcohol and does not drink caffeine on a daily basis. She tries to hydrate well but could do better by self-report. She tries to exercise, uses a bike for exercise on a fairly regular basis. I reviewed your office note from 10/06/2019.    Her Past Medical History Is Significant For: Past Medical History:  Diagnosis Date   . Anemia    iron def  . Breast cancer ([place])    left, Status post chemotherapy and XRT  . CHF (congestive heart failure) ([place])   . DDD (degenerative disc disease), lumbar   . Essential hypertension   . Essential tremor   . Genital warts   . GERD (gastroesophageal reflux disease)   . History of anemia   . History of renal insufficiency   . Hyperlipidemia   . Hypertension   . Insomnia   . Mixed hyperlipidemia   . Osteoarthritis   . Otitis media    right  . Peripheral neuropathy    d/t chemo  . Sinus infection 01/04/14    Her Past Surgical History Is Significant For: Past Surgical History:  Procedure Laterality Date  . APPENDECTOMY  1966  . BREAST LUMPECTOMY WITH RADIOACTIVE SEED AND SENTINEL LYMPH NODE BIOPSY Left 05/06/2018   Procedure: LEFT BREAST LUMPECTOMY WITH RADIOACTIVE SEED AND LEFT DEEP AXILLARY SENTINEL LYMPH NODE BIOPSY AND BLUE DYE INJECTION;  Surgeon: Fanny Skates, MD;  Location: [place];  Service: General;  Laterality: Left;  . CATARACT EXTRACTION W/PHACO Left 01/10/2014   Procedure: CATARACT EXTRACTION PHACO AND INTRAOCULAR LENS PLACEMENT ; CDE:  4.94;  Surgeon: Williams Che, MD;  Location: [facility];  Service: Ophthalmology;  Laterality: Left;  . CESAREAN SECTION  1986  . CHOLECYSTECTOMY    . MYRINGOTOMY WITH TUBE PLACEMENT Right 02/04/2017   Procedure: REVISION OF RIGHT MYRINGOTOMY WITH TUBE PLACEMENT, WITH  EXAM OF LEFT EAR;  Surgeon: Leta Baptist, MD;  Location: [place];  Service: ENT;  Laterality: Right;  . NASAL SINUS SURGERY  2016   polypectomy  . PORT-A-CATH REMOVAL N/A 01/06/2019   Procedure: REMOVAL PORT-A-CATH;  Surgeon: Fanny Skates, MD;  Location: [place];  Service: General;  Laterality: N/A;  . PORTACATH PLACEMENT Right 06/16/2018   Procedure: INSERTION PORT-A-CATH;  Surgeon: Fanny Skates, MD;  Location: [place];  Service: General;  Laterality: Right;  . TONSILLECTOMY  1970     Her Family History Is Significant For: Family History  Problem Relation Age of Onset  . Lupus Mother   . Heart attack Father        age 50  . Prostate cancer Father   . Hypothyroidism Sister   . Breast cancer Sister   . Hypertension Sister   . Hypertension Brother     Her Social History Is Significant For: Social History   Socioeconomic History  . Marital status: Legally Separated    Spouse name: Not on file  . Number of children: 2  . Years of education: Not on file  . Highest education level: Not on file  Occupational History    Comment: retired  Tobacco Use  . Smoking status: Never Smoker  . Smokeless tobacco: Never Used  Vaping Use  . Vaping Use: Never used  Substance and Sexual Activity  . Alcohol use: No  . Drug use: No  . Sexual activity: Not on file  Other Topics Concern  . Not on file  Social History Narrative   One son lives with her   Social Determinants of Health   Financial Resource Strain:   . Difficulty of Paying Living Expenses: Not on file  Food Insecurity:   . Worried About Charity fundraiser in the Last Year: Not on file  . Ran Out of Food in the Last Year: Not on file  Transportation Needs:   . Lack of Transportation (Medical): Not on file  . Lack of Transportation (Non-Medical): Not on file  Physical Activity:   . Days of Exercise per Week: Not on file  . Minutes of Exercise per Session: Not on file  Stress:   . Feeling of Stress : Not on file  Social Connections:   . Frequency of Communication with Friends and Family: Not on file  . Frequency of Social Gatherings with Friends and Family: Not on file  . Attends Religious Services: Not on file  . Active Member of Clubs or Organizations: Not on file  . Attends Archivist Meetings: Not on file  . Marital Status: Not on file    Her Allergies Are:  Allergies  Allergen Reactions  . Eggs Or Egg-Derived Products Nausea And Vomiting  . Other Nausea And Vomiting    Dairy  products\   . Penicillins Hives    Has patient had a PCN reaction causing immediate rash, facial/tongue/throat swelling, SOB or lightheadedness with hypotension: No Has patient had a PCN reaction causing severe rash involving mucus membranes or skin necrosis: No Has patient had a PCN reaction that required hospitalization: No Has patient had a PCN reaction occurring within the last 10 years: No If all of the above answers are "NO", then may proceed with Cephalosporin use.   :   Her Current Medications Are:  Outpatient Encounter Medications as of 12/20/2019  Medication Sig  . acyclovir (ZOVIRAX) 400 MG tablet Take 400 mg by mouth 2 (  two) times daily.   . cetirizine (ZYRTEC) 10 MG tablet Take 10 mg by mouth daily.  . Cholecalciferol (VITAMIN D) 2000 UNITS tablet Take 2,000 Units by mouth daily.  . ferrous sulfate 325 (65 FE) MG tablet Take 325 mg by mouth daily with breakfast.  . metoprolol succinate (TOPROL-XL) 25 MG 24 hr tablet Take 25 mg by mouth daily.  . Multiple Vitamins-Minerals (MULTIVITAMIN WITH MINERALS) tablet Take 1 tablet by mouth daily.  Marland Kitchen omeprazole (PRILOSEC) 40 MG capsule Take 40 mg by mouth daily.  Marland Kitchen zolpidem (AMBIEN) 10 MG tablet Take 10 mg by mouth at bedtime as needed for sleep.  . [DISCONTINUED] naproxen (NAPROSYN) 500 MG tablet Take 1 tablet (500 mg total) by mouth 2 (two) times daily. As needed for pain. Take with food   Facility-Administered Encounter Medications as of 12/20/2019  Medication  . sodium chloride flush (NS) 0.9 % injection 10 mL  :   Review of Systems:  Out of a complete 14 point review of systems, all are reviewed and negative with the exception of these symptoms as listed below:  Review of Systems  Neurological:       Pt presents today to discuss her left handed tremor. Pt is right handed. Pt noticed her tremor 2 months ago and believes that it may be getting worse.    Objective:  Neurological Exam  Physical Exam Physical Examination:    Vitals:   12/20/19 0944  BP: (!) 164/96  Pulse: 61    General Examination: The patient is a very pleasant 69 y.o. female in no acute distress. She appears well-developed and well-nourished and well groomed.   HEENT: Normocephalic, atraumatic, pupils are equal, round and reactive to light and accommodation. Status post bilateral cataract repairs. Extraocular tracking is fairly well preserved, hearing is impaired. She does not have hearing aids. Airway examination reveals mild mouth dryness, dentures in place, tongue protrudes centrally and palate elevates symmetrically. She has no lip, neck or jaw tremor, mild nuchal rigidity is noted, mild facial masking is noted. Normal facial sensation to light touch. She may have mild hypophonia, no dysarthria, no voice tremor noted.  Chest: Clear to auscultation without wheezing, rhonchi or crackles noted.  Heart: S1+S2+0, regular and normal without murmurs, rubs or gallops noted.   Abdomen: Soft, non-tender and non-distended with normal bowel sounds appreciated on auscultation.  Extremities: There is no pitting edema in the distal lower extremities bilaterally. Pedal pulses are intact.  Skin: Warm and dry without trophic changes noted.  Musculoskeletal: exam reveals mild right knee discomfort with mild right knee swelling noted medially. Not tender to touch/palpation.   Neurologically:  Mental status: The patient is awake, alert and oriented in all 4 spheres. Her immediate and remote memory, attention, language skills and fund of knowledge are appropriate. There is no evidence of aphasia, agnosia, apraxia or anomia. Speech is clear with normal prosody and enunciation. Thought process is linear. Mood is normal and affect is normal.  Cranial nerves II - XII are as described above under HEENT exam. In addition: shoulder shrug is normal with equal shoulder height noted. Motor exam: Normal bulk, global strength of 5 out of 5, she has a intermittent mild  resting tremor in the left upper extremity only, no other resting tremor noted. On 12/20/2019: On Archimedes spiral drawing she has known difficulty with either hand, handwriting with her right hand, which is her dominant hand is legible, not tremulous, not particularly micrographic. She has a minimal postural tremor in  the left upper extremity, no significant action tremor in either hand, no intention tremor. She has no lower extremity tremors. Romberg is negative. Reflexes are 1+ in the upper extremities and trace in the knees, absent in the ankles. Babinski: Toes are flexor bilaterally. Fine motor skills and coordination: She has mild difficulty with finger taps on the left side, minimal difficulty or fairly normal in the right upper extremity. Foot taps and foot agility are fairly normal on both sides. Hand movements and rapid alternating patting are slightly difficult with the left only. She has overall mild bradykinesia.   Cerebellar testing: No dysmetria or intention tremor on finger to nose testing. Heel to shin is unremarkable bilaterally. There is no truncal or gait ataxia.  Sensory exam: intact to light touch in the upper and lower extremities.  Gait, station and balance: She stands without difficulty, posture is probably slightly stooped for age. She walks with decreased arm swing on the left, slightly slow in her walking speed with no shuffling noted, no festination, no freezing.  Assessment and Plan:   In summary, Shaniqua Guillot Thomaston is a very pleasant 69 y.o.-year old female with an underlying medical history of hypertension, hyperlipidemia, reflux disease, anemia, degenerative lumbar disc disease, breast cancer with status post lumpectomy in 2020, chemotherapy, XRT, history of neuropathy, and insomnia, who presents for evaluation of her tremor disorder.  History and examination are in keeping with left-sided tremor as well as mild parkinsonian findings, possibly left side predominant mild  Parkinson's disease.  I discussed this diagnosis with the patient and her son in detail today.  We talked about symptomatic medication options and the importance of healthy lifestyle.  As far as diagnostic testing, she is advised that there is no single definitive test for Parkinson's disease.  Nevertheless, as a diagnostic tool we often utilize a English as a second language teacher.  I explained this nuclear medicine SPECT scan to the patient and her son in detail and she is willing to proceed.  I recommended that we hold off on symptomatic medications for now and proceed with a DaTscan first.  We will keep her posted as to her scheduling as well as test results by phone call and arrange for follow-up afterwards to pick up our discussion about symptomatic treatment options such as levodopa.  She is advised to pursue a healthy lifestyle, good nutrition, good hydration with water and regular physical activity.  She is advised to follow-up after scan and we will keep her posted in the interim by phone call as well.  I answered all their questions today and the patient and her son were in agreement with the plan. Thank you very much for allowing me to participate in the care of this nice patient. If I can be of any further assistance to you please do not hesitate to call me at [phone].  Sincerely,   Star Age, MD, PhD

## 2019-12-21 LAB — COMPREHENSIVE METABOLIC PANEL
ALT: 16 IU/L (ref 0–32)
AST: 20 IU/L (ref 0–40)
Albumin/Globulin Ratio: 1.5 (ref 1.2–2.2)
Albumin: 4.1 g/dL (ref 3.7–4.7)
Alkaline Phosphatase: 141 IU/L — ABNORMAL HIGH (ref 44–121)
BUN/Creatinine Ratio: 11 — ABNORMAL LOW (ref 12–28)
BUN: 9 mg/dL (ref 8–27)
Bilirubin Total: 0.8 mg/dL (ref 0.0–1.2)
CO2: 29 mmol/L (ref 20–29)
Calcium: 9.3 mg/dL (ref 8.7–10.3)
Chloride: 105 mmol/L (ref 96–106)
Creatinine, Ser: 0.85 mg/dL (ref 0.57–1.00)
GFR calc Af Amer: 80 mL/min/{1.73_m2} (ref >59)
GFR calc non Af Amer: 69 mL/min/{1.73_m2} (ref >59)
Globulin, Total: 2.8 g/dL (ref 1.5–4.5)
Glucose: 88 mg/dL (ref 65–99)
Potassium: 4.2 mmol/L (ref 3.5–5.2)
Sodium: 144 mmol/L (ref 134–144)
Total Protein: 6.9 g/dL (ref 6.0–8.5)

## 2019-12-21 NOTE — Telephone Encounter (Signed)
Noted  

## 2019-12-29 DIAGNOSIS — C50111 Malignant neoplasm of central portion of right female breast: Secondary | ICD-10-CM | POA: Diagnosis not present

## 2019-12-29 DIAGNOSIS — R922 Inconclusive mammogram: Secondary | ICD-10-CM | POA: Diagnosis not present

## 2019-12-29 DIAGNOSIS — N6002 Solitary cyst of left breast: Secondary | ICD-10-CM | POA: Diagnosis not present

## 2020-01-03 DIAGNOSIS — K219 Gastro-esophageal reflux disease without esophagitis: Secondary | ICD-10-CM | POA: Diagnosis not present

## 2020-01-03 DIAGNOSIS — G20 Parkinson's disease: Secondary | ICD-10-CM | POA: Diagnosis not present

## 2020-01-03 DIAGNOSIS — I10 Essential (primary) hypertension: Secondary | ICD-10-CM | POA: Diagnosis not present

## 2020-01-03 DIAGNOSIS — Z0001 Encounter for general adult medical examination with abnormal findings: Secondary | ICD-10-CM | POA: Diagnosis not present

## 2020-01-03 DIAGNOSIS — M5136 Other intervertebral disc degeneration, lumbar region: Secondary | ICD-10-CM | POA: Diagnosis not present

## 2020-01-03 DIAGNOSIS — Z6824 Body mass index (BMI) 24.0-24.9, adult: Secondary | ICD-10-CM | POA: Diagnosis not present

## 2020-01-03 DIAGNOSIS — D509 Iron deficiency anemia, unspecified: Secondary | ICD-10-CM | POA: Diagnosis not present

## 2020-01-03 DIAGNOSIS — E782 Mixed hyperlipidemia: Secondary | ICD-10-CM | POA: Diagnosis not present

## 2020-01-03 DIAGNOSIS — C50912 Malignant neoplasm of unspecified site of left female breast: Secondary | ICD-10-CM | POA: Diagnosis not present

## 2020-01-05 NOTE — Telephone Encounter (Signed)
Called to Check Status should have approval buy this coming Monday Patient has called to check . X 2

## 2020-01-10 NOTE — Telephone Encounter (Signed)
Sent to [facility] to Schedule DAT scan . [phone]

## 2020-01-25 ENCOUNTER — Encounter (HOSPITAL_COMMUNITY): Payer: Medicare HMO

## 2020-01-27 DIAGNOSIS — Z20822 Contact with and (suspected) exposure to covid-19: Secondary | ICD-10-CM | POA: Diagnosis not present

## 2020-01-28 DIAGNOSIS — Z6824 Body mass index (BMI) 24.0-24.9, adult: Secondary | ICD-10-CM | POA: Diagnosis not present

## 2020-01-28 DIAGNOSIS — M546 Pain in thoracic spine: Secondary | ICD-10-CM | POA: Diagnosis not present

## 2020-01-28 DIAGNOSIS — M4602 Spinal enthesopathy, cervical region: Secondary | ICD-10-CM | POA: Diagnosis not present

## 2020-01-28 DIAGNOSIS — M2578 Osteophyte, vertebrae: Secondary | ICD-10-CM | POA: Diagnosis not present

## 2020-01-28 DIAGNOSIS — M79622 Pain in left upper arm: Secondary | ICD-10-CM | POA: Diagnosis not present

## 2020-01-28 DIAGNOSIS — M4802 Spinal stenosis, cervical region: Secondary | ICD-10-CM | POA: Diagnosis not present

## 2020-01-28 DIAGNOSIS — M40204 Unspecified kyphosis, thoracic region: Secondary | ICD-10-CM | POA: Diagnosis not present

## 2020-01-28 DIAGNOSIS — Z853 Personal history of malignant neoplasm of breast: Secondary | ICD-10-CM | POA: Diagnosis not present

## 2020-01-28 DIAGNOSIS — M50322 Other cervical disc degeneration at C5-C6 level: Secondary | ICD-10-CM | POA: Diagnosis not present

## 2020-01-28 DIAGNOSIS — M47812 Spondylosis without myelopathy or radiculopathy, cervical region: Secondary | ICD-10-CM | POA: Diagnosis not present

## 2020-02-08 ENCOUNTER — Encounter (HOSPITAL_COMMUNITY): Payer: Self-pay

## 2020-02-08 ENCOUNTER — Encounter (HOSPITAL_COMMUNITY): Payer: Medicare HMO

## 2020-02-23 ENCOUNTER — Other Ambulatory Visit: Payer: Self-pay

## 2020-02-23 ENCOUNTER — Encounter (HOSPITAL_COMMUNITY)
Admission: RE | Admit: 2020-02-23 | Discharge: 2020-02-23 | Disposition: A | Discharge status: Home / Self Care | Payer: Medicare HMO | Source: Ambulatory Visit | Attending: Neurology | Admitting: Neurology

## 2020-02-23 ENCOUNTER — Ambulatory Visit (HOSPITAL_COMMUNITY)
Admission: RE | Admit: 2020-02-23 | Discharge: 2020-02-23 | Disposition: A | Discharge status: Home / Self Care | Payer: Medicare HMO | Source: Ambulatory Visit | Attending: Neurology | Admitting: Neurology

## 2020-02-23 DIAGNOSIS — G20 Parkinson's disease: Secondary | ICD-10-CM | POA: Insufficient documentation

## 2020-02-23 DIAGNOSIS — I10 Essential (primary) hypertension: Secondary | ICD-10-CM | POA: Diagnosis not present

## 2020-02-23 DIAGNOSIS — G250 Essential tremor: Secondary | ICD-10-CM | POA: Diagnosis not present

## 2020-02-23 IMAGING — NM NM DATSCAN
6 series · 42 of 49 positions shown · non-contrast
Comparison: None.

CLINICAL DATA: 68-year-old female with central tremor in
hypertension.

EXAM:
NUCLEAR MEDICINE BRAIN IMAGING WITH SPECT  (DaTscan )
TECHNIQUE: SPECT images of the brain were obtained after intravenous injection
of radiopharmaceutical. 4 hour post injection imaging. Appropriate
positioning. 130 mg i-STAT given orally for thyroid blockade.
RADIOPHARMACEUTICALS:  4.6 millicuries I 123 Ioflupane

[Series 1: dat scan · 4.14mm/px · 5 of 120 frames shown]
[frame 11/120  full-range]
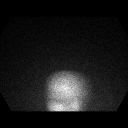
[frame 31/120  full-range]
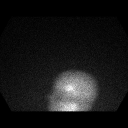
[frame 51/120  full-range]
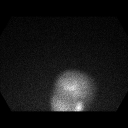
[frame 91/120  full-range]
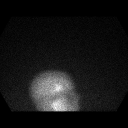
[frame 111/120  full-range]
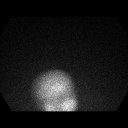

[Series 1: spect - 159 kev _(id)_tra · 4.1mm · 4.14mm/px · 5 of 128 frames shown (1 of 2)]
[frame 11/128]
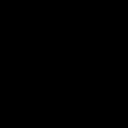
[frame 32/128]
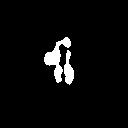
[frame 54/128]
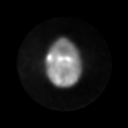
[frame 75/128]
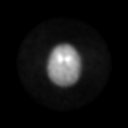
[frame 118/128]
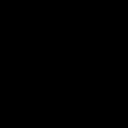

[Series 1: spect - 159 kev _(id)_cor · 4.1mm · 4.14mm/px · 5 of 128 frames shown (1 of 2)]
[frame 11/128]
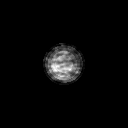
[frame 32/128]
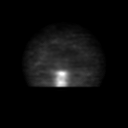
[frame 54/128]
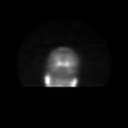
[frame 75/128]
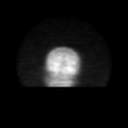
[frame 96/128]
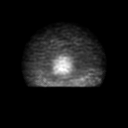

[Series 1: spect - 159 kev _(id)_tra · 4.1mm · 4.14mm/px · 6 of 128 frames shown (2 of 2)]
[frame 11/128]
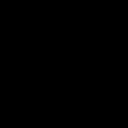
[frame 32/128]
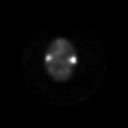
[frame 54/128]
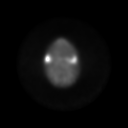
[frame 75/128]
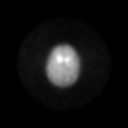
[frame 96/128]
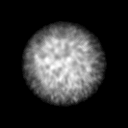
[frame 118/128]
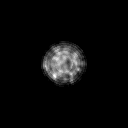

[Series 1: spect - 159 kev _(id)_cor · 4.1mm · 4.14mm/px · 5 of 128 frames shown (2 of 2)]
[frame 32/128]
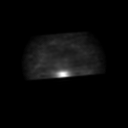
[frame 54/128]
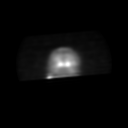
[frame 75/128]
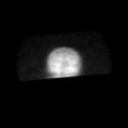
[frame 96/128]
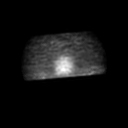
[frame 118/128]
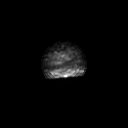

[Series 1022: mpr (id) range · 0.88mm/px · 16 of 36 slices shown]
[im 1/36]
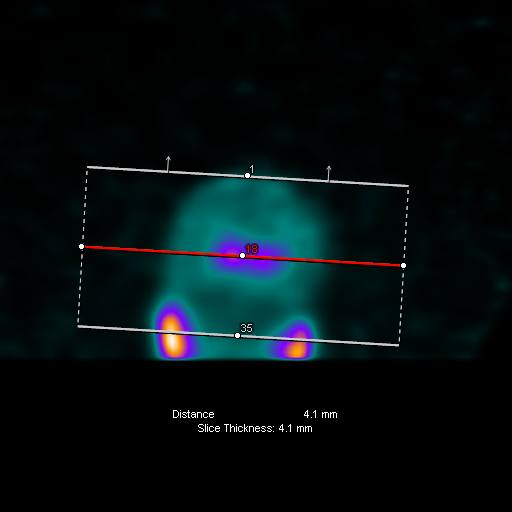
[im 4/36]
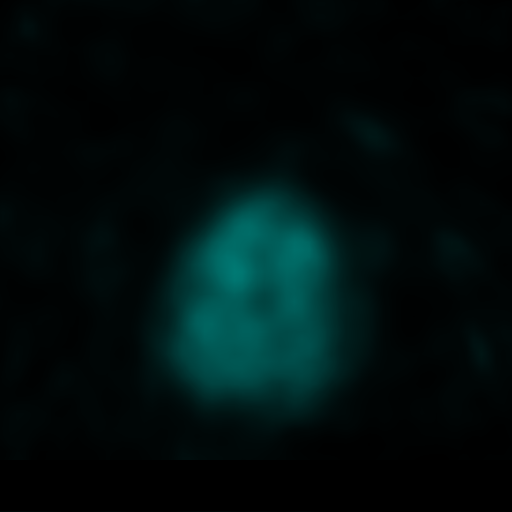
[im 6/36]
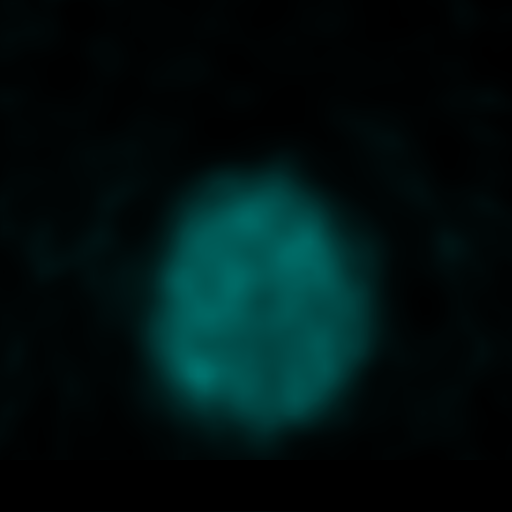
[im 8/36]
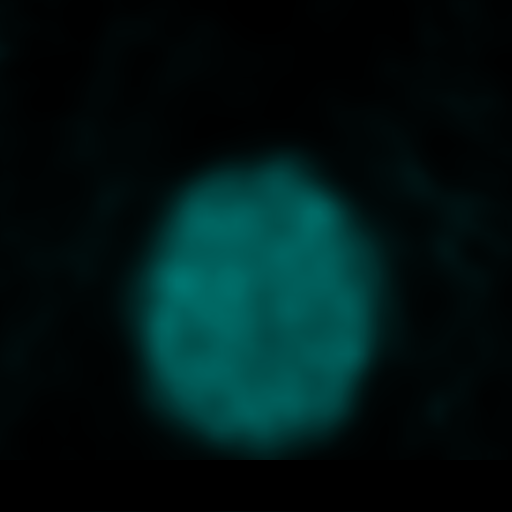
[im 10/36]
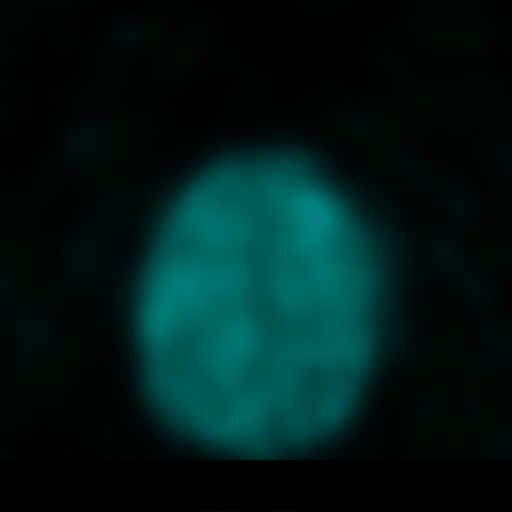
[im 12/36]
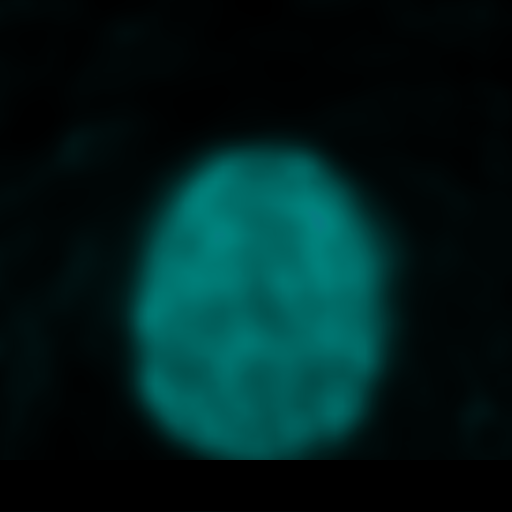
[im 14/36]
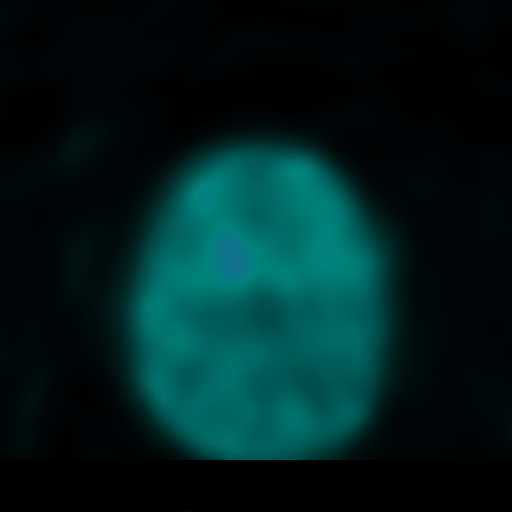
[im 18/36]
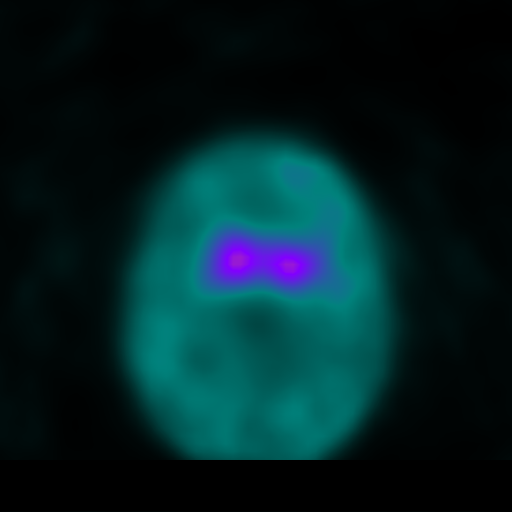
[im 20/36]
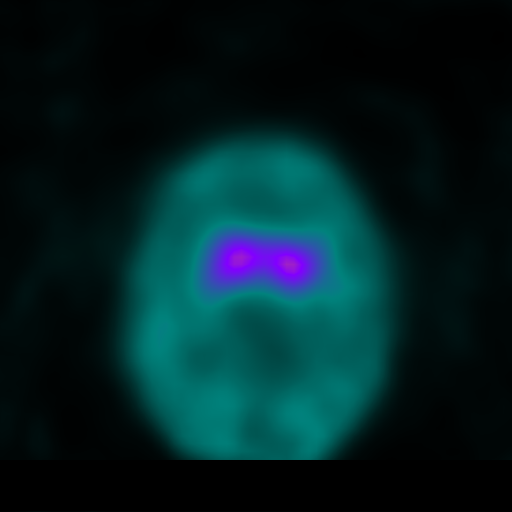
[im 22/36]
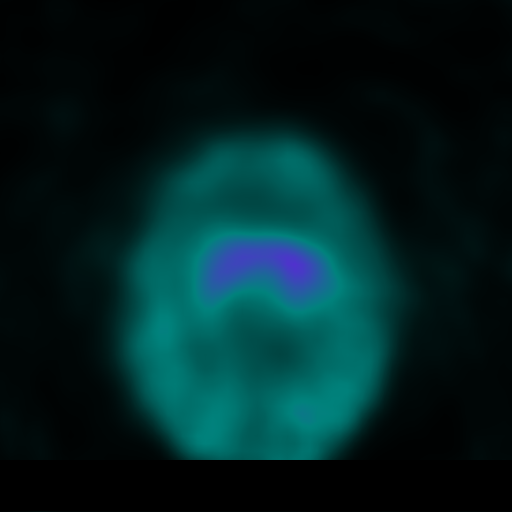
[im 24/36]
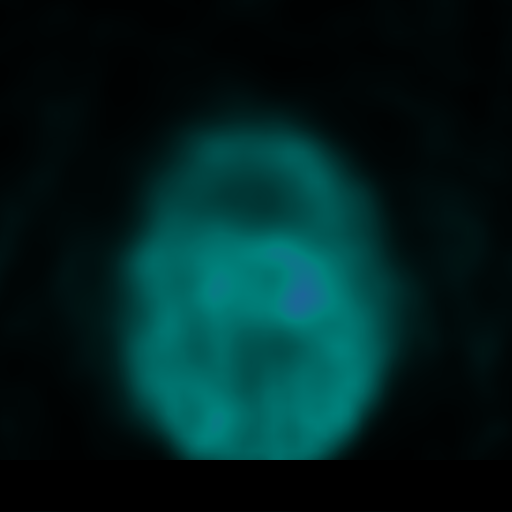
[im 26/36]
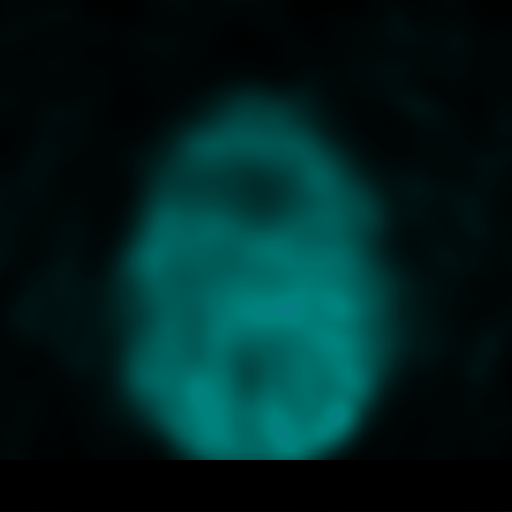
[im 28/36]
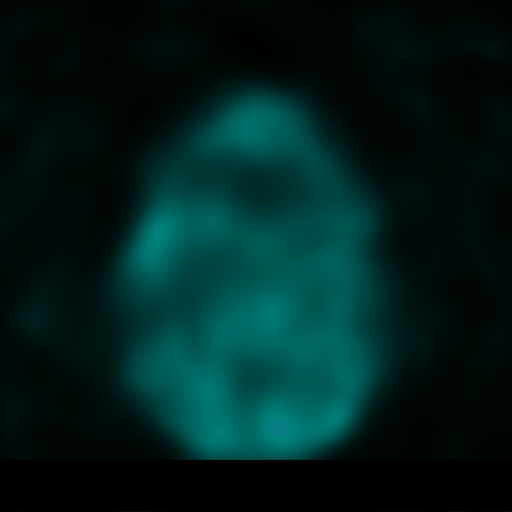
[im 32/36]
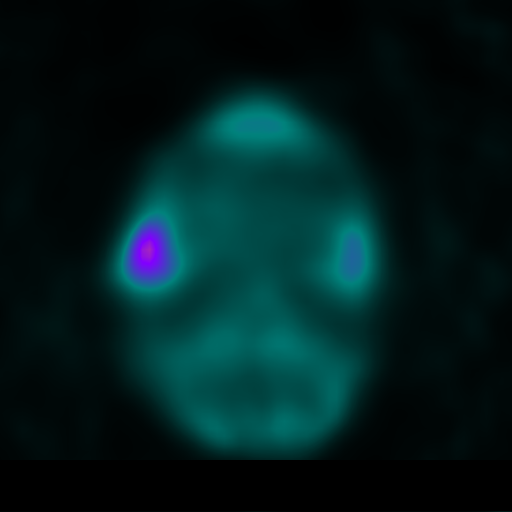
[im 34/36]
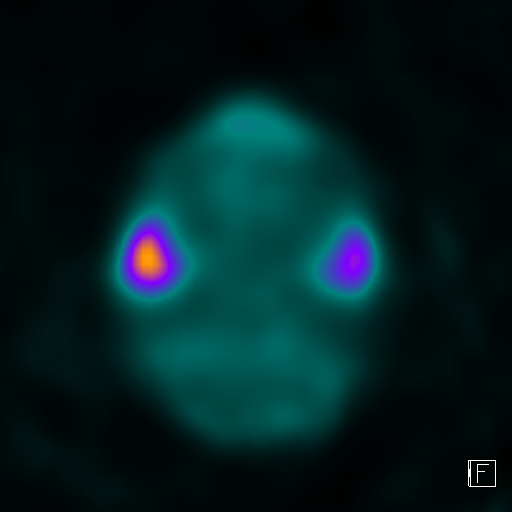
[im 36/36]
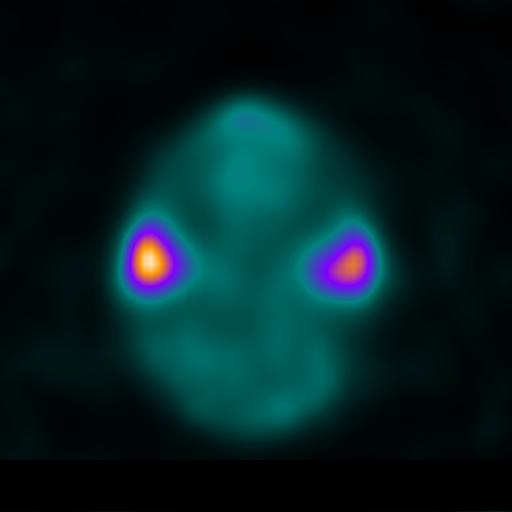

[42 of 49 positions shown; findings below may reference images not displayed]

FINDINGS: Near absent loss of radiotracer activity within the posterior
striatum. Loss of activity in the posterior striatum (putamen) is
symmetric side-to-side. Relative decreased counts within the heads
of the caudate nuclei also symmetric
IMPRESSION: Significant decreased striatal Ioflupane activity most prominent in
the posterior striatum. This pattern can be seen in Parkinsonian
syndromes.

Of note, DaTSCAN is not diagnostic of Parkinsonian syndromes, which
remains a clinical diagnosis. DaTscan is an adjuvant test to aid in
the clinical diagnosis of Parkinsonian syndromes.

## 2020-02-23 MED ORDER — IOFLUPANE I 123 185 MBQ/2.5ML IV SOLN
4.5800 | Freq: Once | INTRAVENOUS | Status: AC | PRN
  Administered 2020-02-23: 4.58 via INTRAVENOUS
  Filled 2020-02-23: qty 5

## 2020-02-23 MED ORDER — POTASSIUM IODIDE (ANTIDOTE) 130 MG PO TABS
ORAL_TABLET | ORAL | Status: AC
  Filled 2020-02-23: qty 1

## 2020-02-23 MED ORDER — POTASSIUM IODIDE (ANTIDOTE) 130 MG PO TABS: 130.0000 mg | ORAL_TABLET | Freq: Once | ORAL | Status: DC

## 2020-02-23 NOTE — Progress Notes (Signed)
Pls call patient or her son regarding the recent nuclear medicine DaT scan result. As discussed, this is a specialized brain scan designed to help with diagnosis of tremor disorders, including parkinsonian disorders. A radioactive marker gets injected and the uptake is measured in the brain and compared to normal controls and right side is compared to the left. A change in uptake can help with diagnosis of certain tremor disorders and narrow down the diagnostic possibilities. The patient's recent scan indicated abnormal (as in lower) uptake as compared to normal uptake pattern indicating an underlying parkinsonian disorder including. Again, this is not a definitive test for Parkinson's disease and does not distinguish between Parkinson's disease and other, atypical parkinsonism disorders.  I do not believe she has an appointment scheduled with me.  Please offer a follow-up appointment so we can discuss her symptoms, review her exam and think about medication options.

## 2020-02-28 ENCOUNTER — Encounter: Payer: Self-pay | Admitting: Neurology

## 2020-02-28 ENCOUNTER — Telehealth: Payer: Self-pay

## 2020-02-28 NOTE — Telephone Encounter (Signed)
Pt returned phone call. Would like a call back. 

## 2020-02-28 NOTE — Telephone Encounter (Signed)
-----   Message from Star Age, MD sent at 02/23/2020  5:02 PM EST ----- Pls call patient or her son regarding the recent nuclear medicine DaT scan result. As discussed, this is a specialized brain scan designed to help with diagnosis of tremor disorders, including parkinsonian disorders. A radioactive marker gets injected and the uptake is measured in the brain and compared to normal controls and right side is compared to the left. A change in uptake can help with diagnosis of certain tremor disorders and narrow down the diagnostic possibilities. The patient's recent scan indicated abnormal (as in lower) uptake as compared to normal uptake pattern indicating an underlying parkinsonian disorder including. Again, this is not a definitive test for Parkinson's disease and does not distinguish between Parkinson's disease and other, atypical parkinsonism disorders.  I do not believe she has an appointment scheduled with me.  Please offer a follow-up appointment so we can discuss her symptoms, review her exam and think about medication options.

## 2020-02-28 NOTE — Telephone Encounter (Signed)
error 

## 2020-02-28 NOTE — Telephone Encounter (Signed)
Patient returned my call and we scheduled f/u for 03/01/20.  Patient will discuss results with MD during this visit.

## 2020-02-28 NOTE — Telephone Encounter (Signed)
I called pt. No answer, left a message asking pt to call me back.   

## 2020-03-01 ENCOUNTER — Encounter: Payer: Self-pay | Admitting: Neurology

## 2020-03-01 ENCOUNTER — Ambulatory Visit (INDEPENDENT_AMBULATORY_CARE_PROVIDER_SITE_OTHER): Payer: Medicare HMO | Admitting: Neurology

## 2020-03-01 VITALS — BP 124/72 | HR 70 | Ht 63.0 in | Wt 136.3 lb

## 2020-03-01 DIAGNOSIS — G20 Parkinson's disease: Secondary | ICD-10-CM | POA: Diagnosis not present

## 2020-03-01 MED ORDER — PRAMIPEXOLE DIHYDROCHLORIDE 0.125 MG PO TABS: ORAL_TABLET | ORAL | 5 refills | Status: DC

## 2020-03-01 NOTE — Progress Notes (Signed)
Subjective:    Patient ID: Kimberly Mccormick is a 70 y.o. female.  HPI     Interim history:   Kimberly Mccormick is a 70 year old right-handed woman with an underlying medical history of hypertension, hyperlipidemia, reflux disease, anemia, degenerative lumbar disc disease, breast cancer with status post lumpectomy in 2020, chemotherapy, XRT, history of neuropathy, and insomnia, who presents for follow up consultation of her parkinsonism, following her recent DaT scan. The patient is unaccompanied today.  I first met her on 12/20/2019 at the request of her primary care physician, at which time she reported a several year history of intermittent left hand tremors.  Examination showed evidence of mild left-sided parkinsonism.  We talked about potentially utilizing symptomatic treatments in the near future and she was encouraged to pursue a DaTscan.  She was agreeable.  She had an interim DaTscan on 02/23/2020 and I reviewed the results: IMPRESSION: Significant decreased striatal Ioflupane activity most prominent in the posterior striatum. This pattern can be seen in Parkinsonian syndromes.   Of note, DaTSCAN is not diagnostic of Parkinsonian syndromes, which remains a clinical diagnosis. DaTscan is an adjuvant test to aid in the clinical diagnosis of Parkinsonian syndromes.  We called her with her test results and arrange for a follow-up appointment.   Today, 03/01/20: She reports feeling fairly stable.  She has had no new symptoms, no falls, no significant constipation, tries to hydrate well.  Appetite is good, tremor bothers her, even when she writes with her right hand the left hand wants to shake.  She is trying to pick up her feet while walking and work on her posture in general.  She is interested in starting medication for her symptom control.  She had a friend who looked up some supplemental herbal remedy and she is wondering about it.  She does not have a name, but is encouraged to let me know if she  finds out at home.  I did admit to her that there is no known herbal remedy for Parkinson's symptoms that I know of.  The patient's allergies, current medications, family history, past medical history, past social history, past surgical history and problem list were reviewed and updated as appropriate.   Previously:   12/20/19: (She) reports a 2 to 41-month history of intermittent left hand tremors. Per son, her tremor may have been ongoing for about 5 to 6 months as per his recollection. She notices the tremor while sitting at rest and also when writing with her right hand, her tremor exacerbates on the left side. She does not have much in the way of right hand tremor and is not particularly bothered by the tremor. She denies any balance issues or falls. Later on in our discussion she does admit to other symptoms including some posture changes with leaning over more than usual and she has noticed that her voice becomes weaker or softer as the day progresses. She has a paternal aunt who had Parkinson's disease. Both parents lived to be 90 years old. She lives with her son, she has another son. She is separated. 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 She is a non-smoker and does not utilize alcohol and does not drink caffeine on a daily basis. She tries to hydrate well but could do better by self-report. She tries to exercise, uses a bike for exercise on a fairly regular basis. I reviewed your office note from 10/06/2019.     Her Past  Medical History Is Significant For: Past Medical History:  Diagnosis Date  . Anemia    iron def  . Breast cancer ([place])    left, Status post chemotherapy and XRT  . CHF (congestive heart failure) ([place])   . DDD (degenerative disc disease), lumbar   . Essential hypertension   . Essential tremor   . Genital warts   . GERD (gastroesophageal reflux disease)   . History of anemia   . History of renal insufficiency   .  Hyperlipidemia   . Hypertension   . Insomnia   . Mixed hyperlipidemia   . Osteoarthritis   . Otitis media    right  . Peripheral neuropathy    d/t chemo  . Sinus infection 01/04/14    Her Past Surgical History Is Significant For: Past Surgical History:  Procedure Laterality Date  . APPENDECTOMY  1966  . BREAST LUMPECTOMY WITH RADIOACTIVE SEED AND SENTINEL LYMPH NODE BIOPSY Left 05/06/2018   Procedure: LEFT BREAST LUMPECTOMY WITH RADIOACTIVE SEED AND LEFT DEEP AXILLARY SENTINEL LYMPH NODE BIOPSY AND BLUE DYE INJECTION;  Surgeon: Fanny Skates, MD;  Location: [facility];  Service: General;  Laterality: Left;  . CATARACT EXTRACTION W/PHACO Left 01/10/2014   Procedure: CATARACT EXTRACTION PHACO AND INTRAOCULAR LENS PLACEMENT ; CDE:  4.94;  Surgeon: Williams Che, MD;  Location: [facility];  Service: Ophthalmology;  Laterality: Left;  . CESAREAN SECTION  1986  . CHOLECYSTECTOMY    . MYRINGOTOMY WITH TUBE PLACEMENT Right 02/04/2017   Procedure: REVISION OF RIGHT MYRINGOTOMY WITH TUBE PLACEMENT, WITH EXAM OF LEFT EAR;  Surgeon: Leta Baptist, MD;  Location: [facility];  Service: ENT;  Laterality: Right;  . NASAL SINUS SURGERY  2016   polypectomy  . PORT-A-CATH REMOVAL N/A 01/06/2019   Procedure: REMOVAL PORT-A-CATH;  Surgeon: Fanny Skates, MD;  Location: [facility];  Service: General;  Laterality: N/A;  . PORTACATH PLACEMENT Right 06/16/2018   Procedure: INSERTION PORT-A-CATH;  Surgeon: Fanny Skates, MD;  Location: [facility];  Service: General;  Laterality: Right;  . TONSILLECTOMY  1970    Her Family History Is Significant For: Family History  Problem Relation Age of Onset  . Lupus Mother   . Heart attack Father        age 26  . Prostate cancer Father   . Hypothyroidism Sister   . Breast cancer Sister   . Hypertension Sister   . Hypertension Brother     Her Social History Is Significant For: Social History    Socioeconomic History  . Marital status: Legally Separated    Spouse name: Not on file  . Number of children: 2  . Years of education: Not on file  . Highest education level: Not on file  Occupational History    Comment: retired  Tobacco Use  . Smoking status: Never Smoker  . Smokeless tobacco: Never Used  Vaping Use  . Vaping Use: Never used  Substance and Sexual Activity  . Alcohol use: No  . Drug use: No  . Sexual activity: Not on file  Other Topics Concern  . Not on file  Social History Narrative   One son lives with her   Social Determinants of Health   Financial Resource Strain: Not on file  Food Insecurity: Not on file  Transportation Needs: Not on file  Physical Activity: Not on file  Stress: Not on file  Social Connections: Not on file    Her Allergies Are:  Allergies  Allergen Reactions  . Eggs Or Egg-Derived Products Nausea And Vomiting  . Other Nausea And Vomiting    Dairy products\   . Penicillins Hives    Has patient had a PCN reaction causing immediate rash, facial/tongue/throat swelling, SOB or lightheadedness with hypotension: No Has patient had a PCN reaction causing severe rash involving mucus membranes or skin necrosis: No Has patient had a PCN reaction that required hospitalization: No Has patient had a PCN reaction occurring within the last 10 years: No If all of the above answers are "NO", then may proceed with Cephalosporin use.   :   Her Current Medications Are:  Outpatient Encounter Medications as of 03/01/2020  Medication Sig  . acyclovir (ZOVIRAX) 400 MG tablet Take 400 mg by mouth 2 (two) times daily.   . cetirizine (ZYRTEC) 10 MG tablet Take 10 mg by mouth daily.  . Cholecalciferol (VITAMIN D) 2000 UNITS tablet Take 2,000 Units by mouth daily.  . ferrous sulfate 325 (65 FE) MG tablet Take 325 mg by mouth daily with breakfast.  . metoprolol succinate (TOPROL-XL) 25 MG 24 hr tablet Take 25 mg by mouth daily.  . Multiple  Vitamins-Minerals (MULTIVITAMIN WITH MINERALS) tablet Take 1 tablet by mouth daily.  Marland Kitchen omeprazole (PRILOSEC) 40 MG capsule Take 40 mg by mouth daily.  Marland Kitchen zolpidem (AMBIEN) 10 MG tablet Take 10 mg by mouth at bedtime as needed for sleep.   Facility-Administered Encounter Medications as of 03/01/2020  Medication  . sodium chloride flush (NS) 0.9 % injection 10 mL  :  Review of Systems:  Out of a complete 14 point review of systems, all are reviewed and negative with the exception of these symptoms as listed below: Review of Systems  Neurological:       Here to review results of recent dat scan.     Objective:  Neurological Exam  Physical Exam Physical Examination:   Vitals:   03/01/20 1408  BP: 124/72  Pulse: 70  SpO2: 96%    General Examination: The patient is a very pleasant 70 y.o. female in no acute distress. She appears well-developed and well-nourished and well groomed.  Good spirits.  HEENT: Normocephalic, atraumatic, pupils are equal, round and reactive to light. Status post bilateral cataract repairs. Extraocular tracking is fairly well preserved, hearing is mildly impaired. She does not have hearing aids. Airway examination reveals mild mouth dryness, dentures in place, tongue protrudes centrally and palate elevates symmetrically. She has no lip, neck or jaw tremor, mild nuchal rigidity is noted, mild facial masking is noted. Normal facial sensation to light touch. She has mild hypophonia, no dysarthria, no voice tremor noted.  Chest: Clear to auscultation without wheezing, rhonchi or crackles noted.  Heart: S1+S2+0, regular and normal without murmurs, rubs or gallops noted.   Abdomen: Soft, non-tender and non-distended.  Extremities: There is no pitting edema in the distal lower extremities bilaterally.  Skin: Warm and dry without trophic changes noted.  Musculoskeletal: exam reveals mild right knee discomfort with mild right knee swelling noted medially. Not  tender to touch/palpation.   Neurologically:  Mental status: The patient is awake, alert and oriented in all 4 spheres. Her immediate and remote memory, attention, language skills and fund of knowledge are appropriate. There is no evidence of aphasia, agnosia, apraxia or anomia. Speech is clear with normal prosody and enunciation. Thought process is linear. Mood is normal and affect is normal.  Cranial nerves II - XII are as described above under HEENT exam.  In addition: shoulder shrug is normal with equal shoulder height noted. Motor exam: Normal bulk, global strength of 5 out of 5, she has a intermittent mild resting tremor in the left upper extremity only, no other resting tremor noted. On 12/20/2019: On Archimedes spiral drawing she has known difficulty with either hand, handwriting with her right hand, which is her dominant hand is legible, not tremulous, not particularly micrographic. She has a minimal postural tremor in the left upper extremity, no significant action tremor in either hand, no intention tremor. She has no lower extremity tremors. Romberg is negative. Reflexes are 1+ in the upper extremities and trace in the knees, absent in the ankles. Babinski: Toes are flexor bilaterally. Fine motor skills and coordination: She has mild difficulty with finger taps on the left side, minimal difficulty or fairly normal in the right upper extremity. Foot taps and foot agility are fairly normal on both sides. Hand movements and rapid alternating patting are slightly difficult with the left only. She has overall mild bradykinesia.   Cerebellar testing: No dysmetria or intention tremor on finger to nose testing. Heel to shin is unremarkable bilaterally. There is no truncal or gait ataxia.  Sensory exam: intact to light touch in the upper and lower extremities.  Gait, station and balance: She stands without difficulty, posture is probably slightly stooped for age. She walks with decreased arm swing on  the left, slightly slow in her walking speed with no shuffling noted, no festination, no freezing.  Assessment and Plan:   In summary, Kimberly Mccormick is a very pleasant 70 y.o.-year old female with an underlying medical history of hypertension, hyperlipidemia, reflux disease, anemia, degenerative lumbar disc disease, breast cancer with status post lumpectomy in 2020, chemotherapy, XRT, history of neuropathy, and insomnia, who presents for follow-up consultation of her parkinsonism.  Her history, exam and recent DaTscan results are supportive of left-sided predominant Parkinson's disease.  We talked about her symptoms, exam and test findings today.  She is agreeable to starting symptomatic treatment.  I would recommend generic Mirapex and low dose, starting at 0.125 mg strength with gradual titration up to 3 pills 3 times daily.  She also reports that a friend had ordered a supplement for her online.  She is wondering if she should start it.  She is discouraged from starting 2 new medications even if one of them is an over-the-counter supplement.  I also advised her that I am not familiar with any herbal remedies for Parkinson's disease and would be happy to look up the supplement she has ordered but may not be able to give her much advice on it.  She is willing to start the Mirapex at this time.  We talked about medication side effects, expectations and limitations.  Impulse control disorder or dopamine dysregulation syndrome was also discussed with her in detail today.  She was given a new prescription and detailed written instructions.  For sleep, she is advised to try to take over-the-counter melatonin.  She is advised to be cautious with the use of Ambien.  She is advised to follow-up in this clinic in about 3 months, sooner if needed.  I answered all her questions today and she was in agreement.  She is encouraged to continue to pursue healthy lifestyle, try to stay well-hydrated, and well rested.  She is  advised to be mindful about constipation, thankfully has not had any recent issues.   I spent 30 minutes in total face-to-face time and in reviewing  records during pre-charting, more than 50% of which was spent in counseling and coordination of care, reviewing test results, reviewing medications and treatment regimen and/or in discussing or reviewing the diagnosis of PD, the prognosis and treatment options. Pertinent laboratory and imaging test results that were available during this visit with the patient were reviewed by me and considered in my medical decision making (see chart for details).

## 2020-03-01 NOTE — Patient Instructions (Addendum)
It was nice to see you again today.  As discussed, your history, exam and recent DaT scan are supportive of a diagnosis of Parkinson's disease, findings are still on the mild side.  I would like for you to start a medication called Mirapex (generic name: pramipexole) 0.125 mg: Take 1 pill once daily for 3 days, then 1 pill twice daily for 3 days, then 1 pill three times a day for 3 days, then 2 pills 3 times a day for 1 week, then 3 pills 3 times a day thereafter. Common side effects reported are: Sedation, sleepiness, nausea, vomiting, and rare side effects are confusion, hallucinations, swelling in legs, and abnormal behaviors, including impulse control problems, which can manifest as excessive eating, obsessions with food or gambling, or hypersexuality.  Please continue with healthy lifestyle, try to exercise in the form of walking on a regular basis, try to stay well-hydrated and well rested.  Please be cautious with the use of Ambien as it can affect your balance and cause daytime drowsiness.   You can try Melatonin at night for sleep: take 1 mg to 3 mg, one to 2 hours before your bedtime. You can go up to 5 mg if needed. It is over the counter and comes in pill form, chewable form and spray, if you prefer.

## 2020-03-16 DIAGNOSIS — G20 Parkinson's disease: Secondary | ICD-10-CM | POA: Diagnosis not present

## 2020-03-16 DIAGNOSIS — M546 Pain in thoracic spine: Secondary | ICD-10-CM | POA: Diagnosis not present

## 2020-03-16 DIAGNOSIS — I10 Essential (primary) hypertension: Secondary | ICD-10-CM | POA: Diagnosis not present

## 2020-03-16 DIAGNOSIS — M5136 Other intervertebral disc degeneration, lumbar region: Secondary | ICD-10-CM | POA: Diagnosis not present

## 2020-03-16 DIAGNOSIS — G250 Essential tremor: Secondary | ICD-10-CM | POA: Diagnosis not present

## 2020-03-16 DIAGNOSIS — M62838 Other muscle spasm: Secondary | ICD-10-CM | POA: Diagnosis not present

## 2020-04-18 DIAGNOSIS — I10 Essential (primary) hypertension: Secondary | ICD-10-CM | POA: Diagnosis not present

## 2020-04-18 DIAGNOSIS — G20 Parkinson's disease: Secondary | ICD-10-CM | POA: Diagnosis not present

## 2020-04-18 DIAGNOSIS — H6123 Impacted cerumen, bilateral: Secondary | ICD-10-CM | POA: Diagnosis not present

## 2020-04-18 DIAGNOSIS — E7849 Other hyperlipidemia: Secondary | ICD-10-CM | POA: Diagnosis not present

## 2020-04-18 DIAGNOSIS — C50912 Malignant neoplasm of unspecified site of left female breast: Secondary | ICD-10-CM | POA: Diagnosis not present

## 2020-04-18 DIAGNOSIS — D509 Iron deficiency anemia, unspecified: Secondary | ICD-10-CM | POA: Diagnosis not present

## 2020-04-18 DIAGNOSIS — K219 Gastro-esophageal reflux disease without esophagitis: Secondary | ICD-10-CM | POA: Diagnosis not present

## 2020-04-18 DIAGNOSIS — M5136 Other intervertebral disc degeneration, lumbar region: Secondary | ICD-10-CM | POA: Diagnosis not present

## 2020-04-26 DIAGNOSIS — Z1211 Encounter for screening for malignant neoplasm of colon: Secondary | ICD-10-CM | POA: Diagnosis not present

## 2020-04-26 DIAGNOSIS — Z1212 Encounter for screening for malignant neoplasm of rectum: Secondary | ICD-10-CM | POA: Diagnosis not present

## 2020-04-28 DIAGNOSIS — R29898 Other symptoms and signs involving the musculoskeletal system: Secondary | ICD-10-CM | POA: Diagnosis not present

## 2020-04-28 DIAGNOSIS — M546 Pain in thoracic spine: Secondary | ICD-10-CM | POA: Diagnosis not present

## 2020-05-02 LAB — COLOGUARD: COLOGUARD: POSITIVE — AB

## 2020-05-02 LAB — EXTERNAL GENERIC LAB PROCEDURE: COLOGUARD: POSITIVE — AB

## 2020-05-03 ENCOUNTER — Encounter (INDEPENDENT_AMBULATORY_CARE_PROVIDER_SITE_OTHER): Payer: Self-pay | Admitting: *Deleted

## 2020-05-03 DIAGNOSIS — M546 Pain in thoracic spine: Secondary | ICD-10-CM | POA: Diagnosis not present

## 2020-05-03 DIAGNOSIS — R29898 Other symptoms and signs involving the musculoskeletal system: Secondary | ICD-10-CM | POA: Diagnosis not present

## 2020-05-10 DIAGNOSIS — R29898 Other symptoms and signs involving the musculoskeletal system: Secondary | ICD-10-CM | POA: Diagnosis not present

## 2020-05-10 DIAGNOSIS — M546 Pain in thoracic spine: Secondary | ICD-10-CM | POA: Diagnosis not present

## 2020-05-11 DIAGNOSIS — H9313 Tinnitus, bilateral: Secondary | ICD-10-CM | POA: Diagnosis not present

## 2020-05-11 DIAGNOSIS — H6121 Impacted cerumen, right ear: Secondary | ICD-10-CM | POA: Diagnosis not present

## 2020-05-11 DIAGNOSIS — H906 Mixed conductive and sensorineural hearing loss, bilateral: Secondary | ICD-10-CM | POA: Diagnosis not present

## 2020-05-11 DIAGNOSIS — H7203 Central perforation of tympanic membrane, bilateral: Secondary | ICD-10-CM | POA: Diagnosis not present

## 2020-05-12 DIAGNOSIS — M546 Pain in thoracic spine: Secondary | ICD-10-CM | POA: Diagnosis not present

## 2020-05-12 DIAGNOSIS — R29898 Other symptoms and signs involving the musculoskeletal system: Secondary | ICD-10-CM | POA: Diagnosis not present

## 2020-05-17 DIAGNOSIS — Z9889 Other specified postprocedural states: Secondary | ICD-10-CM | POA: Diagnosis not present

## 2020-05-17 DIAGNOSIS — R922 Inconclusive mammogram: Secondary | ICD-10-CM | POA: Diagnosis not present

## 2020-05-17 DIAGNOSIS — M546 Pain in thoracic spine: Secondary | ICD-10-CM | POA: Diagnosis not present

## 2020-05-17 DIAGNOSIS — Z853 Personal history of malignant neoplasm of breast: Secondary | ICD-10-CM | POA: Diagnosis not present

## 2020-05-17 DIAGNOSIS — R29898 Other symptoms and signs involving the musculoskeletal system: Secondary | ICD-10-CM | POA: Diagnosis not present

## 2020-05-19 DIAGNOSIS — R29898 Other symptoms and signs involving the musculoskeletal system: Secondary | ICD-10-CM | POA: Diagnosis not present

## 2020-05-19 DIAGNOSIS — M546 Pain in thoracic spine: Secondary | ICD-10-CM | POA: Diagnosis not present

## 2020-05-22 DIAGNOSIS — M546 Pain in thoracic spine: Secondary | ICD-10-CM | POA: Diagnosis not present

## 2020-05-22 DIAGNOSIS — R29898 Other symptoms and signs involving the musculoskeletal system: Secondary | ICD-10-CM | POA: Diagnosis not present

## 2020-05-26 DIAGNOSIS — R29898 Other symptoms and signs involving the musculoskeletal system: Secondary | ICD-10-CM | POA: Diagnosis not present

## 2020-05-26 DIAGNOSIS — M546 Pain in thoracic spine: Secondary | ICD-10-CM | POA: Diagnosis not present

## 2020-05-29 DIAGNOSIS — M546 Pain in thoracic spine: Secondary | ICD-10-CM | POA: Diagnosis not present

## 2020-05-29 DIAGNOSIS — R29898 Other symptoms and signs involving the musculoskeletal system: Secondary | ICD-10-CM | POA: Diagnosis not present

## 2020-05-29 DIAGNOSIS — M2569 Stiffness of other specified joint, not elsewhere classified: Secondary | ICD-10-CM | POA: Diagnosis not present

## 2020-06-01 ENCOUNTER — Telehealth: Payer: Self-pay | Admitting: Hematology and Oncology

## 2020-06-01 DIAGNOSIS — R5383 Other fatigue: Secondary | ICD-10-CM | POA: Diagnosis not present

## 2020-06-01 DIAGNOSIS — Z853 Personal history of malignant neoplasm of breast: Secondary | ICD-10-CM | POA: Diagnosis not present

## 2020-06-01 DIAGNOSIS — M549 Dorsalgia, unspecified: Secondary | ICD-10-CM | POA: Diagnosis not present

## 2020-06-01 DIAGNOSIS — G8929 Other chronic pain: Secondary | ICD-10-CM | POA: Diagnosis not present

## 2020-06-01 DIAGNOSIS — Z170 Estrogen receptor positive status [ER+]: Secondary | ICD-10-CM | POA: Diagnosis not present

## 2020-06-01 DIAGNOSIS — C50212 Malignant neoplasm of upper-inner quadrant of left female breast: Secondary | ICD-10-CM | POA: Diagnosis not present

## 2020-06-01 DIAGNOSIS — Z08 Encounter for follow-up examination after completed treatment for malignant neoplasm: Secondary | ICD-10-CM | POA: Diagnosis not present

## 2020-06-01 NOTE — Telephone Encounter (Signed)
Called pt to r/s appts per 5/12 sch msg. Pt said she would call back to r/s once she heard back from her brother who provides her transportation.

## 2020-06-02 ENCOUNTER — Other Ambulatory Visit: Payer: Medicare HMO

## 2020-06-02 ENCOUNTER — Ambulatory Visit: Payer: Medicare HMO | Admitting: Hematology and Oncology

## 2020-06-15 ENCOUNTER — Telehealth: Payer: Self-pay | Admitting: Hematology and Oncology

## 2020-06-15 NOTE — Telephone Encounter (Signed)
Scheduled appts per 5/13 sch msg. Pt aware.  

## 2020-06-22 ENCOUNTER — Encounter: Payer: Self-pay | Admitting: Hematology and Oncology

## 2020-06-23 DIAGNOSIS — M2569 Stiffness of other specified joint, not elsewhere classified: Secondary | ICD-10-CM | POA: Diagnosis not present

## 2020-06-23 DIAGNOSIS — R29898 Other symptoms and signs involving the musculoskeletal system: Secondary | ICD-10-CM | POA: Diagnosis not present

## 2020-06-23 DIAGNOSIS — M546 Pain in thoracic spine: Secondary | ICD-10-CM | POA: Diagnosis not present

## 2020-06-26 DIAGNOSIS — R29898 Other symptoms and signs involving the musculoskeletal system: Secondary | ICD-10-CM | POA: Diagnosis not present

## 2020-06-26 DIAGNOSIS — M546 Pain in thoracic spine: Secondary | ICD-10-CM | POA: Diagnosis not present

## 2020-06-26 DIAGNOSIS — M2569 Stiffness of other specified joint, not elsewhere classified: Secondary | ICD-10-CM | POA: Diagnosis not present

## 2020-06-28 ENCOUNTER — Other Ambulatory Visit: Payer: Self-pay | Admitting: *Deleted

## 2020-06-28 DIAGNOSIS — Z170 Estrogen receptor positive status [ER+]: Secondary | ICD-10-CM

## 2020-06-28 NOTE — Assessment & Plan Note (Signed)
05/08/2018 lumpectomy Kimberly Mccormick): IDC with DCIS, 1.8cm, grade 2, ER+ (95%), PR-, HER2 negative (1+, IHC), Ki67 20%, clear margins, 4 SLN negative. Oncotype DX score 30: Distant recurrence risk at 9 years 19% Treatment plan: 1. Adjuvant chemotherapy with Taxotere and Cytoxan x4 (patient refused) 2. Adjuvant radiation therapyat [facility] 3. Followed by adjuvant antiestrogen therapy: (Patient undecided) ---------------------------------------------------------------------------------------------------------------------------------------------- Treatment plan: I recommended anastrozole 1 mg p.o. daily x 5-7years  Previously I provided her with all the information. She firmly believes in prayerto answer her questions. Back pain issues: Probably arthritis CT chest abdomen pelvis 02/11/2019: No evidence of metastatic disease.  Neuropathy: Recommended gabapentin but she does not want to take it.  Anastrozole counseling: After lengthy discussion about the risks and benefits of anastrozole, patient has not decided yet.  She will call me with her decision later. Patient would like to get a mammogram at [facility].  I sent an order for that.  Return to clinic in 6 months for checkup

## 2020-06-28 NOTE — Progress Notes (Signed)
Patient Care Team: Curlene Labrum, MD as PCP - General Domenic Polite Aloha Gell, MD as PCP - Cardiology (Cardiology) Mauro Kaufmann, RN as Oncology Nurse Navigator Rockwell Germany, RN as Oncology Nurse Navigator  DIAGNOSIS:    ICD-10-CM   1. Malignant neoplasm of upper-inner quadrant of left breast in female, estrogen receptor positive ([place])  C50.212    Z17.0       SUMMARY OF ONCOLOGIC HISTORY: Oncology History  Malignant neoplasm of upper-inner quadrant of left breast in female, estrogen receptor positive ([place])  03/24/2018 Initial Diagnosis   Screening detected left breast mass 8 mm upper inner quadrant biopsy revealed grade 2 IDC with DCIS ER 95%, PR 0%, Ki-67 20%, HER-2 -1+ by IHC, T1 BN 0 stage Ia clinical stage    05/06/2018 Surgery   Lumpectomy [facility] Batman): IDC with DCIS, 1.8cm, grade 2, ER+ (95%), PR-, HER2 negative (1+, IHC), Ki67 20%, clear margins, 4 SLN negative.    05/18/2018 Oncotype testing   Oncotype DX recurrence score 30: risk of distant recurrence at 9 years is 19%. Chemo benefit is >15%.     06/23/2018 -  Chemotherapy   Adjuvant chemotherapy with dose dense Adriamycin and Cytoxan x4 followed by Taxol weekly x12     CHIEF COMPLIANT: Follow-up of left breast cancer  INTERVAL HISTORY: Kimberly Mccormick is a 70 y.o. with above-mentioned history of left breast cancer treated with lumpectomy, adjuvant chemotherapy, and radiation. Mammogram on 05/17/20 showed no evidence of malignancy bilaterally. She presents to the clinic today for follow-up.  She has chronic mild neuropathy which is stable.  She tells me that she was diagnosed with Parkinson's disease.  Denies any pain lumps or nodules in the breast.  ALLERGIES:  is allergic to eggs or egg-derived products, other, and penicillins.  MEDICATIONS:  Current Outpatient Medications  Medication Sig Dispense Refill   acyclovir (ZOVIRAX) 400 MG tablet Take 400 mg by mouth 2 (two) times daily.      cetirizine (ZYRTEC) 10 MG  tablet Take 10 mg by mouth daily.     Cholecalciferol (VITAMIN D) 2000 UNITS tablet Take 2,000 Units by mouth daily.     ferrous sulfate 325 (65 FE) MG tablet Take 325 mg by mouth daily with breakfast.     metoprolol succinate (TOPROL-XL) 25 MG 24 hr tablet Take 25 mg by mouth daily.  3   Multiple Vitamins-Minerals (MULTIVITAMIN WITH MINERALS) tablet Take 1 tablet by mouth daily.     omeprazole (PRILOSEC) 40 MG capsule Take 40 mg by mouth daily.     pramipexole (MIRAPEX) 0.125 MG tablet Take 1 pill once daily for 3 days, then 1 pill twice daily for 3 days, then 1 pill three times a day for 3 days, then 2 pills 3 times a day for 1 week, then 3 pills 3 times a day thereafter. 270 tablet 5   zolpidem (AMBIEN) 10 MG tablet Take 10 mg by mouth at bedtime as needed for sleep.     No current facility-administered medications for this visit.   Facility-Administered Medications Ordered in Other Visits  Medication Dose Route Frequency Provider Last Rate Last Admin   sodium chloride flush (NS) 0.9 % injection 10 mL  10 mL Intracatheter PRN Nicholas Lose, MD   10 mL at 08/04/18 1518    PHYSICAL EXAMINATION: ECOG PERFORMANCE STATUS: 1 - Symptomatic but completely ambulatory  Vitals:   06/29/20 1134  BP: (!) 163/98  Pulse: (!) 59  Resp: 18  Temp: (!) 97.5 F (  36.4 C)  SpO2: 100%   Filed Weights   06/29/20 1134  Weight: 138 lb 14.4 oz (63 kg)    BREAST: No palpable masses or nodules in either right or left breasts. No palpable axillary supraclavicular or infraclavicular adenopathy no breast tenderness or nipple discharge. (exam performed in the presence of a chaperone)  LABORATORY DATA:  I have reviewed the data as listed CMP Latest Ref Rng & Units 06/29/2020 12/20/2019 02/14/2019  Glucose 70 - 99 mg/dL 91 88 99  BUN 8 - 23 mg/dL $Remove'11 9 13  'XKbwdJg$ Creatinine 0.44 - 1.00 mg/dL 0.82 0.85 0.73  Sodium 135 - 145 mmol/L 142 144 142  Potassium 3.5 - 5.1 mmol/L 3.8 4.2 3.9  Chloride 98 - 111 mmol/L 107 105  107  CO2 22 - 32 mmol/L $RemoveB'28 29 27  'EQQQKdjm$ Calcium 8.9 - 10.3 mg/dL 9.3 9.3 9.0  Total Protein 6.5 - 8.1 g/dL 6.9 6.9 -  Total Bilirubin 0.3 - 1.2 mg/dL 1.0 0.8 -  Alkaline Phos 38 - 126 U/L 96 141(H) -  AST 15 - 41 U/L 18 20 -  ALT 0 - 44 U/L 13 16 -    Lab Results  Component Value Date   WBC 5.0 06/29/2020   HGB 11.6 (L) 06/29/2020   HCT 35.2 (L) 06/29/2020   MCV 83.0 06/29/2020   PLT 208 06/29/2020   NEUTROABS 2.0 06/29/2020    ASSESSMENT & PLAN:  Malignant neoplasm of upper-inner quadrant of left breast in female, estrogen receptor positive ([place]) 05/08/2018 lumpectomy [facility] Batman): IDC with DCIS, 1.8cm, grade 2, ER+ (95%), PR-, HER2 negative (1+, IHC), Ki67 20%, clear margins, 4 SLN negative. Oncotype DX score 30: Distant recurrence risk at 9 years 19% Treatment plan: 1.  Adjuvant chemotherapy with Taxotere and Cytoxan x4 (patient refused) 2.  Adjuvant [facility] therapy at [facility] 3.  Followed by adjuvant antiestrogen therapy: (Patient undecided) ---------------------------------------------------------------------------------------------------------------------------------------------- Treatment plan: I recommended anastrozole 1 mg p.o. daily x 5-7 years  Previously I provided her with all the information.  She firmly believes in prayer to answer her questions. Back pain issues: Probably arthritis CT chest abdomen pelvis 02/11/2019: No evidence of metastatic disease.   Neuropathy: Recommended gabapentin but she does not want to take it.   Anastrozole counseling: She decided not to take anastrozole Breast cancer surveillance: Mammogram April 2022 at [facility]: Benign Breast exam 06/29/2020: No palpable lumps or nodules.  Return to clinic in 1 year for checkup    No orders of the defined types were placed in this encounter.  The patient has a good understanding of the overall plan. she agrees with it. she will call with any problems that may develop before the next visit  here.  Total time spent: 20 mins including face to face time and time spent for planning, charting and coordination of care  Rulon Eisenmenger, MD, MPH 06/29/2020  I, Cloyde Reams Dorshimer, am acting as scribe for Dr. Nicholas Lose.  I have reviewed the above documentation for accuracy and completeness, and I agree with the above.

## 2020-06-29 ENCOUNTER — Inpatient Hospital Stay (HOSPITAL_BASED_OUTPATIENT_CLINIC_OR_DEPARTMENT_OTHER): Payer: Medicare Other | Admitting: Hematology and Oncology

## 2020-06-29 ENCOUNTER — Other Ambulatory Visit: Payer: Self-pay

## 2020-06-29 ENCOUNTER — Inpatient Hospital Stay: Payer: Medicare Other | Attending: Hematology and Oncology

## 2020-06-29 DIAGNOSIS — C50212 Malignant neoplasm of upper-inner quadrant of left female breast: Secondary | ICD-10-CM | POA: Diagnosis not present

## 2020-06-29 DIAGNOSIS — Z923 Personal history of irradiation: Secondary | ICD-10-CM | POA: Insufficient documentation

## 2020-06-29 DIAGNOSIS — Z170 Estrogen receptor positive status [ER+]: Secondary | ICD-10-CM | POA: Insufficient documentation

## 2020-06-29 DIAGNOSIS — G629 Polyneuropathy, unspecified: Secondary | ICD-10-CM | POA: Insufficient documentation

## 2020-06-29 LAB — CBC WITH DIFFERENTIAL (CANCER CENTER ONLY)
Abs Immature Granulocytes: 0.02 10*3/uL (ref 0.00–0.07)
Basophils Absolute: 0 10*3/uL (ref 0.0–0.1)
Basophils Relative: 1 %
Eosinophils Absolute: 0.4 10*3/uL (ref 0.0–0.5)
Eosinophils Relative: 7 %
HCT: 35.2 % — ABNORMAL LOW (ref 36.0–46.0)
Hemoglobin: 11.6 g/dL — ABNORMAL LOW (ref 12.0–15.0)
Immature Granulocytes: 0 %
Lymphocytes Relative: 45 %
Lymphs Abs: 2.3 10*3/uL (ref 0.7–4.0)
MCH: 27.4 pg (ref 26.0–34.0)
MCHC: 33 g/dL (ref 30.0–36.0)
MCV: 83 fL (ref 80.0–100.0)
Monocytes Absolute: 0.4 10*3/uL (ref 0.1–1.0)
Monocytes Relative: 7 %
Neutro Abs: 2 10*3/uL (ref 1.7–7.7)
Neutrophils Relative %: 40 %
Platelet Count: 208 10*3/uL (ref 150–400)
RBC: 4.24 MIL/uL (ref 3.87–5.11)
RDW: 12.3 % (ref 11.5–15.5)
WBC Count: 5 10*3/uL (ref 4.0–10.5)
nRBC: 0 % (ref 0.0–0.2)

## 2020-06-29 LAB — CMP (CANCER CENTER ONLY)
ALT: 13 U/L (ref 0–44)
AST: 18 U/L (ref 15–41)
Albumin: 3.6 g/dL (ref 3.5–5.0)
Alkaline Phosphatase: 96 U/L (ref 38–126)
Anion gap: 7 (ref 5–15)
BUN: 11 mg/dL (ref 8–23)
CO2: 28 mmol/L (ref 22–32)
Calcium: 9.3 mg/dL (ref 8.9–10.3)
Chloride: 107 mmol/L (ref 98–111)
Creatinine: 0.82 mg/dL (ref 0.44–1.00)
GFR, Estimated: >60 mL/min (ref >60)
Glucose, Bld: 91 mg/dL (ref 70–99)
Potassium: 3.8 mmol/L (ref 3.5–5.1)
Sodium: 142 mmol/L (ref 135–145)
Total Bilirubin: 1 mg/dL (ref 0.3–1.2)
Total Protein: 6.9 g/dL (ref 6.5–8.1)

## 2020-07-05 DIAGNOSIS — R29898 Other symptoms and signs involving the musculoskeletal system: Secondary | ICD-10-CM | POA: Diagnosis not present

## 2020-07-05 DIAGNOSIS — M546 Pain in thoracic spine: Secondary | ICD-10-CM | POA: Diagnosis not present

## 2020-07-05 DIAGNOSIS — M2569 Stiffness of other specified joint, not elsewhere classified: Secondary | ICD-10-CM | POA: Diagnosis not present

## 2020-07-07 DIAGNOSIS — M546 Pain in thoracic spine: Secondary | ICD-10-CM | POA: Diagnosis not present

## 2020-07-07 DIAGNOSIS — M2569 Stiffness of other specified joint, not elsewhere classified: Secondary | ICD-10-CM | POA: Diagnosis not present

## 2020-07-07 DIAGNOSIS — R29898 Other symptoms and signs involving the musculoskeletal system: Secondary | ICD-10-CM | POA: Diagnosis not present

## 2020-07-11 DIAGNOSIS — I10 Essential (primary) hypertension: Secondary | ICD-10-CM | POA: Diagnosis not present

## 2020-07-11 DIAGNOSIS — E7849 Other hyperlipidemia: Secondary | ICD-10-CM | POA: Diagnosis not present

## 2020-07-11 DIAGNOSIS — M546 Pain in thoracic spine: Secondary | ICD-10-CM | POA: Diagnosis not present

## 2020-07-11 DIAGNOSIS — K219 Gastro-esophageal reflux disease without esophagitis: Secondary | ICD-10-CM | POA: Diagnosis not present

## 2020-07-11 DIAGNOSIS — M2569 Stiffness of other specified joint, not elsewhere classified: Secondary | ICD-10-CM | POA: Diagnosis not present

## 2020-07-11 DIAGNOSIS — E559 Vitamin D deficiency, unspecified: Secondary | ICD-10-CM | POA: Diagnosis not present

## 2020-07-11 DIAGNOSIS — E782 Mixed hyperlipidemia: Secondary | ICD-10-CM | POA: Diagnosis not present

## 2020-07-11 DIAGNOSIS — R29898 Other symptoms and signs involving the musculoskeletal system: Secondary | ICD-10-CM | POA: Diagnosis not present

## 2020-07-11 DIAGNOSIS — R5383 Other fatigue: Secondary | ICD-10-CM | POA: Diagnosis not present

## 2020-07-13 ENCOUNTER — Encounter: Payer: Self-pay | Admitting: Hematology and Oncology

## 2020-07-13 ENCOUNTER — Encounter (INDEPENDENT_AMBULATORY_CARE_PROVIDER_SITE_OTHER): Payer: Self-pay

## 2020-07-13 ENCOUNTER — Other Ambulatory Visit: Payer: Self-pay

## 2020-07-13 ENCOUNTER — Ambulatory Visit (INDEPENDENT_AMBULATORY_CARE_PROVIDER_SITE_OTHER): Payer: Medicare Other | Admitting: Gastroenterology

## 2020-07-13 ENCOUNTER — Telehealth (INDEPENDENT_AMBULATORY_CARE_PROVIDER_SITE_OTHER): Payer: Self-pay

## 2020-07-13 ENCOUNTER — Encounter (INDEPENDENT_AMBULATORY_CARE_PROVIDER_SITE_OTHER): Payer: Self-pay | Admitting: Gastroenterology

## 2020-07-13 DIAGNOSIS — G8929 Other chronic pain: Secondary | ICD-10-CM | POA: Insufficient documentation

## 2020-07-13 DIAGNOSIS — R109 Unspecified abdominal pain: Secondary | ICD-10-CM | POA: Insufficient documentation

## 2020-07-13 DIAGNOSIS — R195 Other fecal abnormalities: Secondary | ICD-10-CM | POA: Diagnosis not present

## 2020-07-13 MED ORDER — PEG 3350-KCL-NA BICARB-NACL 420 G PO SOLR: 4000.0000 mL | ORAL | 0 refills | Status: DC

## 2020-07-13 MED ORDER — HYOSCYAMINE SULFATE 0.125 MG SL SUBL: 0.1250 mg | SUBLINGUAL_TABLET | Freq: Four times a day (QID) | SUBLINGUAL | 3 refills | Status: DC | PRN

## 2020-07-13 NOTE — Progress Notes (Signed)
Kimberly Mccormick, M.D. Gastroenterology & Hepatology [facility] [address] [place], [place] [address]  Primary Care Physician: Kimberly Labrum, MD [place] [phone]  I will communicate my assessment and recommendations to the referring MD via EMR.  Problems: IBS-M Positive Cologuard testing  History of Present Illness: Kimberly Mccormick is a 70 y.o. female with PMH breast cancer s/p , CHF, IBS, hyperlipidemia, hypertension, who presents for f evaluation after recent positive Cologuard testing and abdominal pain.  The patient was last seen on 04/03/2018. At that time, the patient was advised to take a probiotic for management of abdominal pain episodes.  Patient reports she has had a chronic history of abdominal symptoms for at least the last 5 years since she was seen in our clinic.  She reports that she has to have a bowel movement every time she eats and sometimes it hurts her abdomen before moving her bowels. Has a bowel movement every other day.  Having Bms improve her abdominal pain for a short period time.  No pain when sleeping.  She tried dicyclomine in the past but could not tolerate as she had significant lightheadedness with it.  No improvement with probiotics.  Has been concerned as she states having pale colored stools for the last month.   Recently had a positive Cologuard testing and was referred for evaluation.  She has 2-3 Bms per day.  Denies having any constipation.  The patient denies having any nausea, vomiting, fever, chills, hematochezia, melena, hematemesis, diarrhea, jaundice, pruritus or weight loss.  Most recent abdominal imaging on 01/2019, CT abdomen pelvis with IV contrast IMPRESSION: 1. No acute findings within the chest abdomen or pelvis. No specific findings identified to suggest metastatic disease. 2. Small hiatal hernia.  Last EGD: never Last Colonoscopy:2010 - redundant colon,  incomplete exam to hepatic flexure  Past Medical History: Past Medical History:  Diagnosis Date   Anemia    iron def   Breast cancer ([place])    left, Status post chemotherapy and XRT   CHF (congestive heart failure) (HCC)    DDD (degenerative disc disease), lumbar    Essential hypertension    Essential tremor    Genital warts    GERD (gastroesophageal reflux disease)    History of anemia    History of renal insufficiency    Hyperlipidemia    Hypertension    Insomnia    Mixed hyperlipidemia    Osteoarthritis    Otitis media    right   Peripheral neuropathy    d/t chemo   Sinus infection 01/04/14    Past Surgical History: Past Surgical History:  Procedure Laterality Date   APPENDECTOMY  1966   BREAST LUMPECTOMY WITH RADIOACTIVE SEED AND SENTINEL LYMPH NODE BIOPSY Left 05/06/2018   Procedure: LEFT BREAST LUMPECTOMY WITH RADIOACTIVE SEED AND LEFT DEEP AXILLARY SENTINEL LYMPH NODE BIOPSY AND BLUE DYE INJECTION;  Surgeon: Fanny Skates, MD;  Location: [facility];  Service: General;  Laterality: Left;   CATARACT EXTRACTION W/PHACO Left 01/10/2014   Procedure: CATARACT EXTRACTION PHACO AND INTRAOCULAR LENS PLACEMENT ; CDE:  4.94;  Surgeon: Williams Che, MD;  Location: [facility];  Service: Ophthalmology;  Laterality: Left;   CESAREAN SECTION  1986   CHOLECYSTECTOMY     MYRINGOTOMY WITH TUBE PLACEMENT Right 02/04/2017   Procedure: REVISION OF RIGHT MYRINGOTOMY WITH TUBE PLACEMENT, WITH EXAM OF LEFT EAR;  Surgeon: Leta Baptist, MD;  Location: [facility];  Service: ENT;  Laterality: Right;   NASAL SINUS SURGERY  2016   polypectomy   PORT-A-CATH REMOVAL N/A 01/06/2019   Procedure: REMOVAL PORT-A-CATH;  Surgeon: Fanny Skates, MD;  Location: [facility];  Service: General;  Laterality: N/A;   PORTACATH PLACEMENT Right 06/16/2018   Procedure: INSERTION PORT-A-CATH;  Surgeon: Fanny Skates, MD;  Location: [facility];  Service:  General;  Laterality: Right;   TONSILLECTOMY  1970    Family History: Family History  Problem Relation Age of Onset   Lupus Mother    Heart attack Father        age 23   Prostate cancer Father    Hypothyroidism Sister    Breast cancer Sister    Hypertension Sister    Hypertension Brother     Social History: Social History   Tobacco Use  Smoking Status Never  Smokeless Tobacco Never   Social History   Substance and Sexual Activity  Alcohol Use No   Social History   Substance and Sexual Activity  Drug Use No    Allergies: Allergies  Allergen Reactions   Eggs Or Egg-Derived Products Nausea And Vomiting   Other Nausea And Vomiting    Dairy products\    Penicillins Hives    Has patient had a PCN reaction causing immediate rash, facial/tongue/throat swelling, SOB or lightheadedness with hypotension: No Has patient had a PCN reaction causing severe rash involving mucus membranes or skin necrosis: No Has patient had a PCN reaction that required hospitalization: No Has patient had a PCN reaction occurring within the last 10 years: No If all of the above answers are "NO", then may proceed with Cephalosporin use.     Medications: Current Outpatient Medications  Medication Sig Dispense Refill   acyclovir (ZOVIRAX) 400 MG tablet Take 400 mg by mouth 2 (two) times daily.      cetirizine (ZYRTEC) 10 MG tablet Take 10 mg by mouth daily.     Cholecalciferol (VITAMIN D) 2000 UNITS tablet Take 2,000 Units by mouth daily.     ferrous sulfate 325 (65 FE) MG tablet Take 325 mg by mouth daily with breakfast.     metoprolol succinate (TOPROL-XL) 25 MG 24 hr tablet Take 25 mg by mouth daily.  3   Multiple Vitamins-Minerals (MULTIVITAMIN WITH MINERALS) tablet Take 1 tablet by mouth daily.     omeprazole (PRILOSEC) 40 MG capsule Take 40 mg by mouth daily.     pramipexole (MIRAPEX) 0.125 MG tablet Take 1 pill once daily for 3 days, then 1 pill twice daily for 3 days, then 1 pill  three times a day for 3 days, then 2 pills 3 times a day for 1 week, then 3 pills 3 times a day thereafter. 270 tablet 5   zolpidem (AMBIEN) 10 MG tablet Take 10 mg by mouth at bedtime as needed for sleep.     No current facility-administered medications for this visit.   Facility-Administered Medications Ordered in Other Visits  Medication Dose Route Frequency Provider Last Rate Last Admin   sodium chloride flush (NS) 0.9 % injection 10 mL  10 mL Intracatheter PRN Nicholas Lose, MD   10 mL at 08/04/18 1518    Review of Systems: GENERAL: negative for malaise, night sweats HEENT: No changes in hearing or vision, no nose bleeds or other nasal problems. NECK: Negative for lumps, goiter, pain and significant neck swelling RESPIRATORY: Negative for cough, wheezing CARDIOVASCULAR: Negative for chest pain, leg swelling, palpitations,  orthopnea GI: SEE HPI MUSCULOSKELETAL: Negative for joint pain or swelling, back pain, and muscle pain. SKIN: Negative for lesions, rash PSYCH: Negative for sleep disturbance, mood disorder and recent psychosocial stressors. HEMATOLOGY Negative for prolonged bleeding, bruising easily, and swollen nodes. ENDOCRINE: Negative for cold or heat intolerance, polyuria, polydipsia and goiter. NEURO: negative for tremor, gait imbalance, syncope and seizures. The remainder of the review of systems is noncontributory.   Physical Exam: BP (!) 161/84 (BP Location: Left Arm, Patient Position: Sitting, Cuff Size: Small)   Pulse 77   Temp 99.2 F (37.3 C) (Oral)   Ht 5\' 3"  (1.6 m)   Wt 137 lb (62.1 kg)   BMI 24.27 kg/m  GENERAL: The patient is AO x3, in no acute distress. HEENT: Head is normocephalic and atraumatic. EOMI are intact. Mouth is well hydrated and without lesions. NECK: Supple. No masses LUNGS: Clear to auscultation. No presence of rhonchi/wheezing/rales. Adequate chest expansion HEART: RRR, normal s1 and s2. ABDOMEN: tender to upper abdominal area, no  guarding, no peritoneal signs, and nondistended. BS +. No masses. EXTREMITIES: Without any cyanosis, clubbing, rash, lesions or edema. NEUROLOGIC: AOx3, no focal motor deficit. SKIN: no jaundice, no rashes  Imaging/Labs: To improve her bowel movement frequency. as above  I personally reviewed and interpreted the available labs, imaging and endoscopic files.  Impression and Plan: Kimberly Mccormick is a 70 y.o. female with PMH breast cancer s/p , CHF, IBS, hyperlipidemia, hypertension, who presents for evaluation after recent positive Cologuard testing and abdominal pain.  The patient had positive Cologuard testing recently for which a colonoscopy should be pursued.  This is especially important as her last colonoscopy could not be completed. Discussed cologuard test results in detail, specifically what it means when the test is positive or negative.  Discussed that there is a possibility that even when the test is positive there may not be a polyp found on colonoscopy.  In terms of her abdominal pain, she has had chronic symptoms of pain which seem to be related to irritable bowel syndrome.  Has not presented any red flag signs.  We will prescribe Levsin to improve her abdominal pain complaints but she will also benefit from having more soft bowel movements by taking MiraLAX every day.  If the patient proceeds with abdominal pain will consider proceeding with an EGD for possibly repeating a CT of the abdomen.  -Schedule colonoscopy -Can take Bentyl as needed for abdominal pain -Start taking Miralax 1 capful every day for one week. If bowel movements do not improve, increase to 1 capful every 12 hours. If after two weeks there is no improvement, increase to 1 capful every 8 hours -If no findings in colonoscopy, will repeat CT abdomen  All questions were answered.      Harvel Quale, MD Gastroenterology and Hepatology [facility]

## 2020-07-13 NOTE — Telephone Encounter (Signed)
Kimberly Mccormick, CMA  

## 2020-07-13 NOTE — Patient Instructions (Signed)
Schedule colonoscopy Can take Bentyl as needed for abdominal pain Start taking Miralax 1 capful every day for one week. If bowel movements do not improve, increase to 1 capful every 12 hours. If after two weeks there is no improvement, increase to 1 capful every 8 hours If no findings in colonoscopy, will repeat CT abdomen

## 2020-07-13 NOTE — H&P (View-Only) (Signed)
[name], M.D. Gastroenterology & Hepatology [facility] [address] [place], [place] [address]  Primary Care Physician: Curlene Labrum, MD [place] [id number]  I will communicate my assessment and recommendations to the referring MD via EMR.  Problems: IBS-M Positive Cologuard testing  History of Present Illness: Kimberly Mccormick is a 70 y.o. female with PMH breast cancer s/p , CHF, IBS, hyperlipidemia, hypertension, who presents for f evaluation after recent positive Cologuard testing and abdominal pain.  The patient was last seen on 04/03/2018. At that time, the patient was advised to take a probiotic for management of abdominal pain episodes.  Patient reports she has had a chronic history of abdominal symptoms for at least the last 5 years since she was seen in our clinic.  She reports that she has to have a bowel movement every time she eats and sometimes it hurts her abdomen before moving her bowels. Has a bowel movement every other day.  Having Bms improve her abdominal pain for a short period time.  No pain when sleeping.  She tried dicyclomine in the past but could not tolerate as she had significant lightheadedness with it.  No improvement with probiotics.  Has been concerned as she states having pale colored stools for the last month.   Recently had a positive Cologuard testing and was referred for evaluation.  She has 2-3 Bms per day.  Denies having any constipation.  The patient denies having any nausea, vomiting, fever, chills, hematochezia, melena, hematemesis, diarrhea, jaundice, pruritus or weight loss.  Most recent abdominal imaging on 01/2019, CT abdomen pelvis with IV contrast IMPRESSION: 1. No acute findings within the chest abdomen or pelvis. No specific findings identified to suggest metastatic disease. 2. Small hiatal hernia.  Last EGD: never Last Colonoscopy:2010 - redundant colon,  incomplete exam to hepatic flexure  Past Medical History: Past Medical History:  Diagnosis Date   Anemia    iron def   Breast cancer ([place])    left, Status post chemotherapy and XRT   CHF (congestive heart failure) (HCC)    DDD (degenerative disc disease), lumbar    Essential hypertension    Essential tremor    Genital warts    GERD (gastroesophageal reflux disease)    History of anemia    History of renal insufficiency    Hyperlipidemia    Hypertension    Insomnia    Mixed hyperlipidemia    Osteoarthritis    Otitis media    right   Peripheral neuropathy    d/t chemo   Sinus infection 01/04/14    Past Surgical History: Past Surgical History:  Procedure Laterality Date   APPENDECTOMY  1966   BREAST LUMPECTOMY WITH RADIOACTIVE SEED AND SENTINEL LYMPH NODE BIOPSY Left 05/06/2018   Procedure: LEFT BREAST LUMPECTOMY WITH RADIOACTIVE SEED AND LEFT DEEP AXILLARY SENTINEL LYMPH NODE BIOPSY AND BLUE DYE INJECTION;  Surgeon: Fanny Skates, MD;  Location: [facility];  Service: General;  Laterality: Left;   CATARACT EXTRACTION W/PHACO Left 01/10/2014   Procedure: CATARACT EXTRACTION PHACO AND INTRAOCULAR LENS PLACEMENT ; CDE:  4.94;  Surgeon: Williams Che, MD;  Location: [facility];  Service: Ophthalmology;  Laterality: Left;   CESAREAN SECTION  1986   CHOLECYSTECTOMY     MYRINGOTOMY WITH TUBE PLACEMENT Right 02/04/2017   Procedure: REVISION OF RIGHT MYRINGOTOMY WITH TUBE PLACEMENT, WITH EXAM OF LEFT EAR;  Surgeon: Leta Baptist, MD;  Location: [facility];  Service: ENT;  Laterality: Right;   NASAL SINUS SURGERY  2016   polypectomy   PORT-A-CATH REMOVAL N/A 01/06/2019   Procedure: REMOVAL PORT-A-CATH;  Surgeon: Fanny Skates, MD;  Location: [facility];  Service: General;  Laterality: N/A;   PORTACATH PLACEMENT Right 06/16/2018   Procedure: INSERTION PORT-A-CATH;  Surgeon: Fanny Skates, MD;  Location: [facility];  Service:  General;  Laterality: Right;   TONSILLECTOMY  1970    Family History: Family History  Problem Relation Age of Onset   Lupus Mother    Heart attack Father        age 27   Prostate cancer Father    Hypothyroidism Sister    Breast cancer Sister    Hypertension Sister    Hypertension Brother     Social History: Social History   Tobacco Use  Smoking Status Never  Smokeless Tobacco Never   Social History   Substance and Sexual Activity  Alcohol Use No   Social History   Substance and Sexual Activity  Drug Use No    Allergies: Allergies  Allergen Reactions   Eggs Or Egg-Derived Products Nausea And Vomiting   Other Nausea And Vomiting    Dairy products\    Penicillins Hives    Has patient had a PCN reaction causing immediate rash, facial/tongue/throat swelling, SOB or lightheadedness with hypotension: No Has patient had a PCN reaction causing severe rash involving mucus membranes or skin necrosis: No Has patient had a PCN reaction that required hospitalization: No Has patient had a PCN reaction occurring within the last 10 years: No If all of the above answers are "NO", then may proceed with Cephalosporin use.     Medications: Current Outpatient Medications  Medication Sig Dispense Refill   acyclovir (ZOVIRAX) 400 MG tablet Take 400 mg by mouth 2 (two) times daily.      cetirizine (ZYRTEC) 10 MG tablet Take 10 mg by mouth daily.     Cholecalciferol (VITAMIN D) 2000 UNITS tablet Take 2,000 Units by mouth daily.     ferrous sulfate 325 (65 FE) MG tablet Take 325 mg by mouth daily with breakfast.     metoprolol succinate (TOPROL-XL) 25 MG 24 hr tablet Take 25 mg by mouth daily.  3   Multiple Vitamins-Minerals (MULTIVITAMIN WITH MINERALS) tablet Take 1 tablet by mouth daily.     omeprazole (PRILOSEC) 40 MG capsule Take 40 mg by mouth daily.     pramipexole (MIRAPEX) 0.125 MG tablet Take 1 pill once daily for 3 days, then 1 pill twice daily for 3 days, then 1 pill  three times a day for 3 days, then 2 pills 3 times a day for 1 week, then 3 pills 3 times a day thereafter. 270 tablet 5   zolpidem (AMBIEN) 10 MG tablet Take 10 mg by mouth at bedtime as needed for sleep.     No current facility-administered medications for this visit.   Facility-Administered Medications Ordered in Other Visits  Medication Dose Route Frequency Provider Last Rate Last Admin   sodium chloride flush (NS) 0.9 % injection 10 mL  10 mL Intracatheter PRN Nicholas Lose, MD   10 mL at 08/04/18 1518    Review of Systems: GENERAL: negative for malaise, night sweats HEENT: No changes in hearing or vision, no nose bleeds or other nasal problems. NECK: Negative for lumps, goiter, pain and significant neck swelling RESPIRATORY: Negative for cough, wheezing CARDIOVASCULAR: Negative for chest pain, leg swelling, palpitations,  orthopnea GI: SEE HPI MUSCULOSKELETAL: Negative for joint pain or swelling, back pain, and muscle pain. SKIN: Negative for lesions, rash PSYCH: Negative for sleep disturbance, mood disorder and recent psychosocial stressors. HEMATOLOGY Negative for prolonged bleeding, bruising easily, and swollen nodes. ENDOCRINE: Negative for cold or heat intolerance, polyuria, polydipsia and goiter. NEURO: negative for tremor, gait imbalance, syncope and seizures. The remainder of the review of systems is noncontributory.   Physical Exam: BP (!) 161/84 (BP Location: Left Arm, Patient Position: Sitting, Cuff Size: Small)   Pulse 77   Temp 99.2 F (37.3 C) (Oral)   Ht 5\' 3"  (1.6 m)   Wt 137 lb (62.1 kg)   BMI 24.27 kg/m  GENERAL: The patient is AO x3, in no acute distress. HEENT: Head is normocephalic and atraumatic. EOMI are intact. Mouth is well hydrated and without lesions. NECK: Supple. No masses LUNGS: Clear to auscultation. No presence of rhonchi/wheezing/rales. Adequate chest expansion HEART: RRR, normal s1 and s2. ABDOMEN: tender to upper abdominal area, no  guarding, no peritoneal signs, and nondistended. BS +. No masses. EXTREMITIES: Without any cyanosis, clubbing, rash, lesions or edema. NEUROLOGIC: AOx3, no focal motor deficit. SKIN: no jaundice, no rashes  Imaging/Labs: To improve her bowel movement frequency. as above  I personally reviewed and interpreted the available labs, imaging and endoscopic files.  Impression and Plan: Kimberly Mccormick is a 70 y.o. female with PMH breast cancer s/p , CHF, IBS, hyperlipidemia, hypertension, who presents for evaluation after recent positive Cologuard testing and abdominal pain.  The patient had positive Cologuard testing recently for which a colonoscopy should be pursued.  This is especially important as her last colonoscopy could not be completed. Discussed cologuard test results in detail, specifically what it means when the test is positive or negative.  Discussed that there is a possibility that even when the test is positive there may not be a polyp found on colonoscopy.  In terms of her abdominal pain, she has had chronic symptoms of pain which seem to be related to irritable bowel syndrome.  Has not presented any red flag signs.  We will prescribe Levsin to improve her abdominal pain complaints but she will also benefit from having more soft bowel movements by taking MiraLAX every day.  If the patient proceeds with abdominal pain will consider proceeding with an EGD for possibly repeating a CT of the abdomen.  -Schedule colonoscopy -Can take Bentyl as needed for abdominal pain -Start taking Miralax 1 capful every day for one week. If bowel movements do not improve, increase to 1 capful every 12 hours. If after two weeks there is no improvement, increase to 1 capful every 8 hours -If no findings in colonoscopy, will repeat CT abdomen  All questions were answered.      Harvel Quale, MD Gastroenterology and Hepatology [facility]

## 2020-07-14 ENCOUNTER — Other Ambulatory Visit (INDEPENDENT_AMBULATORY_CARE_PROVIDER_SITE_OTHER): Payer: Self-pay

## 2020-07-14 ENCOUNTER — Encounter: Payer: Self-pay | Admitting: Hematology and Oncology

## 2020-07-19 DIAGNOSIS — I10 Essential (primary) hypertension: Secondary | ICD-10-CM | POA: Diagnosis not present

## 2020-07-19 DIAGNOSIS — C50912 Malignant neoplasm of unspecified site of left female breast: Secondary | ICD-10-CM | POA: Diagnosis not present

## 2020-07-19 DIAGNOSIS — D509 Iron deficiency anemia, unspecified: Secondary | ICD-10-CM | POA: Diagnosis not present

## 2020-07-19 DIAGNOSIS — G20 Parkinson's disease: Secondary | ICD-10-CM | POA: Diagnosis not present

## 2020-07-19 DIAGNOSIS — K219 Gastro-esophageal reflux disease without esophagitis: Secondary | ICD-10-CM | POA: Diagnosis not present

## 2020-07-19 DIAGNOSIS — E7849 Other hyperlipidemia: Secondary | ICD-10-CM | POA: Diagnosis not present

## 2020-07-19 DIAGNOSIS — M546 Pain in thoracic spine: Secondary | ICD-10-CM | POA: Diagnosis not present

## 2020-07-21 DIAGNOSIS — R29898 Other symptoms and signs involving the musculoskeletal system: Secondary | ICD-10-CM | POA: Diagnosis not present

## 2020-07-21 DIAGNOSIS — M546 Pain in thoracic spine: Secondary | ICD-10-CM | POA: Diagnosis not present

## 2020-07-21 DIAGNOSIS — M2569 Stiffness of other specified joint, not elsewhere classified: Secondary | ICD-10-CM | POA: Diagnosis not present

## 2020-07-25 ENCOUNTER — Encounter (HOSPITAL_COMMUNITY)
Admission: RE | Disposition: A | Discharge status: Home / Self Care | Payer: Self-pay | Source: Ambulatory Visit | Attending: Gastroenterology

## 2020-07-25 ENCOUNTER — Ambulatory Visit (HOSPITAL_COMMUNITY): Payer: Medicare Other | Admitting: Anesthesiology

## 2020-07-25 ENCOUNTER — Encounter (HOSPITAL_COMMUNITY): Payer: Self-pay | Admitting: Gastroenterology

## 2020-07-25 ENCOUNTER — Telehealth (INDEPENDENT_AMBULATORY_CARE_PROVIDER_SITE_OTHER): Payer: Self-pay | Admitting: *Deleted

## 2020-07-25 ENCOUNTER — Other Ambulatory Visit: Payer: Self-pay

## 2020-07-25 ENCOUNTER — Ambulatory Visit (HOSPITAL_COMMUNITY)
Admission: RE | Admit: 2020-07-25 | Discharge: 2020-07-25 | Disposition: A | Discharge status: Home / Self Care | Payer: Medicare Other | Source: Ambulatory Visit | Attending: Gastroenterology | Admitting: Gastroenterology

## 2020-07-25 DIAGNOSIS — K648 Other hemorrhoids: Secondary | ICD-10-CM | POA: Diagnosis not present

## 2020-07-25 DIAGNOSIS — R195 Other fecal abnormalities: Secondary | ICD-10-CM | POA: Insufficient documentation

## 2020-07-25 DIAGNOSIS — E782 Mixed hyperlipidemia: Secondary | ICD-10-CM | POA: Diagnosis not present

## 2020-07-25 DIAGNOSIS — R109 Unspecified abdominal pain: Secondary | ICD-10-CM

## 2020-07-25 DIAGNOSIS — I110 Hypertensive heart disease with heart failure: Secondary | ICD-10-CM | POA: Insufficient documentation

## 2020-07-25 DIAGNOSIS — Z79899 Other long term (current) drug therapy: Secondary | ICD-10-CM | POA: Insufficient documentation

## 2020-07-25 DIAGNOSIS — Z853 Personal history of malignant neoplasm of breast: Secondary | ICD-10-CM | POA: Insufficient documentation

## 2020-07-25 DIAGNOSIS — Z803 Family history of malignant neoplasm of breast: Secondary | ICD-10-CM | POA: Diagnosis not present

## 2020-07-25 DIAGNOSIS — Z8042 Family history of malignant neoplasm of prostate: Secondary | ICD-10-CM | POA: Insufficient documentation

## 2020-07-25 DIAGNOSIS — D123 Benign neoplasm of transverse colon: Secondary | ICD-10-CM | POA: Diagnosis not present

## 2020-07-25 DIAGNOSIS — D122 Benign neoplasm of ascending colon: Secondary | ICD-10-CM | POA: Diagnosis not present

## 2020-07-25 DIAGNOSIS — Z880 Allergy status to penicillin: Secondary | ICD-10-CM | POA: Diagnosis not present

## 2020-07-25 DIAGNOSIS — K589 Irritable bowel syndrome without diarrhea: Secondary | ICD-10-CM | POA: Insufficient documentation

## 2020-07-25 DIAGNOSIS — Z9049 Acquired absence of other specified parts of digestive tract: Secondary | ICD-10-CM | POA: Diagnosis not present

## 2020-07-25 DIAGNOSIS — Z8249 Family history of ischemic heart disease and other diseases of the circulatory system: Secondary | ICD-10-CM | POA: Diagnosis not present

## 2020-07-25 DIAGNOSIS — I509 Heart failure, unspecified: Secondary | ICD-10-CM | POA: Diagnosis not present

## 2020-07-25 DIAGNOSIS — K635 Polyp of colon: Secondary | ICD-10-CM | POA: Diagnosis not present

## 2020-07-25 HISTORY — PX: COLONOSCOPY WITH PROPOFOL: SHX5780

## 2020-07-25 HISTORY — PX: POLYPECTOMY: SHX5525

## 2020-07-25 LAB — HM COLONOSCOPY

## 2020-07-25 SURGERY — COLONOSCOPY WITH PROPOFOL
Anesthesia: General

## 2020-07-25 MED ORDER — STERILE WATER FOR IRRIGATION IR SOLN
Status: DC | PRN
  Administered 2020-07-25: 100 mL

## 2020-07-25 MED ORDER — PROPOFOL 10 MG/ML IV BOLUS
INTRAVENOUS | Status: AC
  Filled 2020-07-25: qty 60

## 2020-07-25 MED ORDER — PROPOFOL 500 MG/50ML IV EMUL
INTRAVENOUS | Status: DC | PRN
  Administered 2020-07-25: 150 ug/kg/min via INTRAVENOUS

## 2020-07-25 MED ORDER — PROPOFOL 10 MG/ML IV BOLUS
INTRAVENOUS | Status: DC | PRN
  Administered 2020-07-25: 30 mg via INTRAVENOUS
  Administered 2020-07-25 (×3): 20 mg via INTRAVENOUS
  Administered 2020-07-25: 30 mg via INTRAVENOUS

## 2020-07-25 MED ORDER — LACTATED RINGERS IV SOLN: INTRAVENOUS | Status: DC

## 2020-07-25 NOTE — Interval H&P Note (Signed)
History and Physical Interval Note:  07/25/2020 10:05 AM Letitia SHANYIA STINES is a 70 y.o. female with PMH breast cancer s/p , CHF, IBS, hyperlipidemia, hypertension, who presents for f evaluation after recent positive Cologuard testing and abdominal pain.  Patient reported she is still presenting an episode of epigastric abdominal pain which she has presented for the last 5 years.  Denies any nausea, vomiting, fever, chills, melena or hematochezia.  Ht 5\' 4"  (1.626 m)   Wt 59 kg   BMI 22.31 kg/m  GENERAL: The patient is AO x3, in no acute distress. HEENT: Head is normocephalic and atraumatic. EOMI are intact. Mouth is well hydrated and without lesions. NECK: Supple. No masses LUNGS: Clear to auscultation. No presence of rhonchi/wheezing/rales. Adequate chest expansion HEART: RRR, normal s1 and s2. ABDOMEN: Soft, nontender, no guarding, no peritoneal signs, and nondistended. BS +. No masses. EXTREMITIES: Without any cyanosis, clubbing, rash, lesions or edema. NEUROLOGIC: AOx3, no focal motor deficit. SKIN: no jaundice, no rashes   Kamree Wiens Andrew  has presented today for surgery, with the diagnosis of Positive Cologuard.  The various methods of treatment have been discussed with the patient and family. After consideration of risks, benefits and other options for treatment, the patient has consented to  Procedure(s) with comments: COLONOSCOPY WITH PROPOFOL (N/A) - 10:55 as a surgical intervention.  The patient's history has been reviewed, patient examined, no change in status, stable for surgery.  I have reviewed the patient's chart and labs.  Questions were answered to the patient's satisfaction.     [name] [place]

## 2020-07-25 NOTE — Op Note (Signed)
[facility] Patient Name: Kimberly Mccormick Procedure Date: 07/25/2020 10:33 AM MRN: [id number] Date of Birth: 12/11/1950 Attending MD: [name] ,  CSN: [id number] Age: 70 Admit Type: Outpatient Procedure:                Colonoscopy Indications:              Abdominal pain, Positive Cologuard test Providers:                [name], Lambert Mody, Kristine L.                            Risa Grill, Technician Referring MD:              Medicines:                Monitored Anesthesia Care Complications:            No immediate complications. Estimated Blood Loss:     Estimated blood loss: none. Procedure:                Pre-Anesthesia Assessment:                           - Prior to the procedure, a History and Physical                            was performed, and patient medications, allergies                            and sensitivities were reviewed. The patient's                            tolerance of previous anesthesia was reviewed.                           - The risks and benefits of the procedure and the                            sedation options and risks were discussed with the                            patient. All questions were answered and informed                            consent was obtained.                           - ASA Grade Assessment: II - A patient with mild                            systemic disease.                           After obtaining informed consent, the colonoscope                            was passed under direct vision. Throughout the  procedure, the patient's blood pressure, pulse, and                            oxygen saturations were monitored continuously. The                            [id number]-HQ190L ([id number]) scope was introduced through                            the anus and advanced to the the cecum, identified                            by the appendiceal orifice, ileocecal valve and                             palpation. The colonoscopy was performed without                            difficulty. The patient tolerated the procedure                            well. The quality of the bowel preparation was good. Scope In: 10:52:43 AM Scope Out: 11:16:51 AM Scope Withdrawal Time: 0 hours 9 minutes 12 seconds  Total Procedure Duration: 0 hours 24 minutes 8 seconds  Findings:      Hemorrhoids and anal tags were found on perianal exam.      Two sessile polyps were found in the ascending colon. The polyps were 4       to 10 mm in size. These polyps were removed with a cold snare. Resection       and retrieval were complete.      Non-bleeding internal hemorrhoids were found during retroflexion. The       hemorrhoids were small. Impression:               - Hemorrhoids found on perianal exam.                           - Two 4 to 10 mm polyps in the ascending colon,                            removed with a cold snare. Resected and retrieved.                           - Non-bleeding internal hemorrhoids. Moderate Sedation:      Per Anesthesia Care Recommendation:           - Discharge patient to home (ambulatory).                           - Resume previous diet.                           - Await pathology results.                           - Repeat colonoscopy for surveillance  based on                            pathology results.                           - Schedule CT abdoment pelvis with IV contrast for                            evaluation of chronic abdominal pain. Procedure Code(s):        --- Professional ---                           [phone], Colonoscopy, flexible; with removal of                            tumor(s), polyp(s), or other lesion(s) by snare                            technique Diagnosis Code(s):        --- Professional ---                           K64.8, Other hemorrhoids                           K63.5, Polyp of colon                           R10.9,  Unspecified abdominal pain                           R19.5, Other fecal abnormalities CPT copyright 2019 American Medical Association. All rights reserved. The codes documented in this report are preliminary and upon coder review may  be revised to meet current compliance requirements. [name], MD [name],  07/25/2020 11:22:15 AM This report has been signed electronically. Number of Addenda: 0

## 2020-07-25 NOTE — Transfer of Care (Signed)
Immediate Anesthesia Transfer of Care Note  Patient: Kimberly Mccormick  Procedure(s) Performed: COLONOSCOPY WITH PROPOFOL POLYPECTOMY  Patient Location: Endoscopy Unit  Anesthesia Type:General  Level of Consciousness: awake  Airway & Oxygen Therapy: Patient Spontanous Breathing  Post-op Assessment: Report given to RN  Post vital signs: Reviewed  Last Vitals:  Vitals Value Taken Time  BP    Temp    Pulse    Resp    SpO2      Last Pain:  Vitals:   07/25/20 1046  TempSrc:   PainSc: 0-No pain      Patients Stated Pain Goal: 10 (97/94/80 1655)  Complications: No notable events documented.

## 2020-07-25 NOTE — Anesthesia Preprocedure Evaluation (Addendum)
Anesthesia Evaluation  Patient identified by MRN, date of birth, ID band Patient awake    Reviewed: Allergy & Precautions, NPO status , Patient's Chart, lab work & pertinent test results, reviewed documented beta blocker date and time   History of Anesthesia Complications Negative for: history of anesthetic complications  Airway Mallampati: II  TM Distance: >3 FB Neck ROM: Full    Dental  (+) Upper Dentures, Lower Dentures   Pulmonary neg pulmonary ROS,    Pulmonary exam normal breath sounds clear to auscultation       Cardiovascular Exercise Tolerance: Good hypertension, Pt. on medications and Pt. on home beta blockers +CHF  Normal cardiovascular exam Rhythm:Regular Rate:Normal  14-Feb-2019 18:26:04 [facility] ROUTINE RECORD Sinus rhythm Nonspecific T wave abnormality No significant change since last tracing Confirmed by Lajean Saver [phone]) on 02/14/2019   01/29/19 Positive stress test  02/14/19 - cardiology note Assessment and Plan:  1.  Abnormal ECG as outlined above, noted incidentally per anesthesia evaluation prior to Port-A-Cath removal in December 2020.  She does not describe any anginal type chest pain, has atypical symptoms that she states are most noticeable since her diagnosis and treatment for left breast cancer.  She does have coronary artery calcifications by chest CT imaging, however recent cardiac testing arranged by Dr. Pleas Koch is overall reassuring.  She has normal LVEF, no residual pericardial effusion by echocardiography, and no evidence of scar or ischemia by Myoview.  I would suggest risk factor modification strategies, specifically keeping blood pressure in control and maintaining follow-up of lipids with goal LDL 70 or less.  No additional cardiac testing is planned at this time, she should keep follow-up with Dr. Pleas Koch for now.  We discussed warning signs and symptoms that might prompt  further cardiac assessment   Neuro/Psych  Neuromuscular disease (tremor)    GI/Hepatic Neg liver ROS, GERD  Medicated and Controlled,  Endo/Other  negative endocrine ROS  Renal/GU Renal InsufficiencyRenal disease     Musculoskeletal  (+) Arthritis ,   Abdominal   Peds  Hematology  (+) anemia ,   Anesthesia Other Findings Left breast cancer, chemo/radiation Assessment and Plan:  Cardiology note -02/14/19 1.  Abnormal ECG as outlined above, noted incidentally per anesthesia evaluation prior to Port-A-Cath removal in December 2020.  She does not describe any anginal type chest pain, has atypical symptoms that she states are most noticeable since her diagnosis and treatment for left breast cancer.  She does have coronary artery calcifications by chest CT imaging, however recent cardiac testing arranged by Dr. Pleas Koch is overall reassuring.  She has normal LVEF, no residual pericardial effusion by echocardiography, and no evidence of scar or ischemia by Myoview.  I would suggest risk factor modification strategies, specifically keeping blood pressure in control and maintaining follow-up of lipids with goal LDL 70 or less.  No additional cardiac testing is planned at this time, she should keep follow-up with Dr. Pleas Koch for now.  We discussed warning signs and symptoms that might prompt further cardiac assessment  Reproductive/Obstetrics                           Anesthesia Physical Anesthesia Plan  ASA: 3  Anesthesia Plan: General   Post-op Pain Management:    Induction: Intravenous  PONV Risk Score and Plan: Propofol infusion  Airway Management Planned: Nasal Cannula and Natural Airway  Additional Equipment:   Intra-op Plan:   Post-operative Plan:   Informed  Consent: I have reviewed the patients History and Physical, chart, labs and discussed the procedure including the risks, benefits and alternatives for the proposed anesthesia with the patient or  authorized representative who has indicated his/her understanding and acceptance.     Dental advisory given  Plan Discussed with: CRNA and Surgeon  Anesthesia Plan Comments:       Anesthesia Quick Evaluation

## 2020-07-25 NOTE — Anesthesia Postprocedure Evaluation (Signed)
Anesthesia Post Note  Patient: Kimberly Mccormick  Procedure(s) Performed: COLONOSCOPY WITH PROPOFOL POLYPECTOMY  Patient location during evaluation: Endoscopy Anesthesia Type: General Level of consciousness: awake and alert Pain management: pain level controlled Vital Signs Assessment: post-procedure vital signs reviewed and stable Respiratory status: spontaneous breathing Cardiovascular status: blood pressure returned to baseline and stable Postop Assessment: no apparent nausea or vomiting Anesthetic complications: no   No notable events documented.   Last Vitals:  Vitals:   07/25/20 1003  BP: (!) 147/90  Resp: (!) 67  Temp: 36.6 C  SpO2: 99%    Last Pain:  Vitals:   07/25/20 1046  TempSrc:   PainSc: 0-No pain                 Myli Pae

## 2020-07-25 NOTE — Telephone Encounter (Signed)
Per op note - patient needs CT A/P w/ contrast

## 2020-07-25 NOTE — Telephone Encounter (Signed)
Noted! Thank you

## 2020-07-25 NOTE — Discharge Instructions (Addendum)
You are being discharged to home.  Resume your previous diet.  We are waiting for your pathology results.  Your physician has recommended a repeat colonoscopy for surveillance based on pathology results.  Schedule CT abdoment pelvis with IV contrast for evaluation of chronic abdominal pain.  Office will call regarding CT scheduling information

## 2020-07-26 ENCOUNTER — Other Ambulatory Visit (INDEPENDENT_AMBULATORY_CARE_PROVIDER_SITE_OTHER): Payer: Self-pay

## 2020-07-26 DIAGNOSIS — G8929 Other chronic pain: Secondary | ICD-10-CM

## 2020-07-26 LAB — SURGICAL PATHOLOGY

## 2020-07-26 NOTE — Progress Notes (Signed)
Kimberly Mccormick, CMA  

## 2020-07-27 ENCOUNTER — Encounter (INDEPENDENT_AMBULATORY_CARE_PROVIDER_SITE_OTHER): Payer: Self-pay | Admitting: *Deleted

## 2020-08-02 ENCOUNTER — Encounter (HOSPITAL_COMMUNITY): Payer: Self-pay | Admitting: Gastroenterology

## 2020-08-14 ENCOUNTER — Telehealth (INDEPENDENT_AMBULATORY_CARE_PROVIDER_SITE_OTHER): Payer: Self-pay | Admitting: Gastroenterology

## 2020-08-14 NOTE — Telephone Encounter (Signed)
Patient called the office stated she is to have a CT scan done on 7/28 - states she has discussed with her family and they think she shouldn't have it because of too much radiation - would like to discuss with Dr Jenetta Downer - please advise - ph# [phone]

## 2020-08-14 NOTE — Telephone Encounter (Signed)
Spoke to the patient, recommended proceeding with CT of the abdomen pelvis with IV contrast for evaluation of abdominal pain.  She will proceed with this as she already has an appointment.  Next step will be an EGD if her CT is unremarkable.

## 2020-08-17 ENCOUNTER — Other Ambulatory Visit: Payer: Self-pay

## 2020-08-17 ENCOUNTER — Ambulatory Visit (HOSPITAL_COMMUNITY)
Admission: RE | Admit: 2020-08-17 | Discharge: 2020-08-17 | Disposition: A | Discharge status: Home / Self Care | Payer: Medicare Other | Source: Ambulatory Visit | Attending: Gastroenterology | Admitting: Gastroenterology

## 2020-08-17 DIAGNOSIS — R109 Unspecified abdominal pain: Secondary | ICD-10-CM | POA: Diagnosis not present

## 2020-08-17 DIAGNOSIS — G8929 Other chronic pain: Secondary | ICD-10-CM | POA: Diagnosis not present

## 2020-08-17 LAB — POCT I-STAT CREATININE: Creatinine, Ser: 0.8 mg/dL (ref 0.44–1.00)

## 2020-08-17 IMAGING — CT CT ABD-PELV W/ CM
2 of 5 series · 16 of 46 positions shown, 18 images · IV contrast (Omnipaque or Isovue)
Comparison: [DATE].

CLINICAL DATA: Chronic epigastric abdominal pain.

EXAM:
CT ABDOMEN AND PELVIS WITH CONTRAST
TECHNIQUE: Multidetector CT imaging of the abdomen and pelvis was performed
using the standard protocol following bolus administration of
intravenous contrast.
CONTRAST:  100mL OMNIPAQUE IOHEXOL 300 MG/ML  SOLN

[Series 2: axial st · axial · 0.65mm/px · z∈[+943,+1298]mm · 13 of 81 slices shown, 15 images]
[im 5/81  soft-tissue]
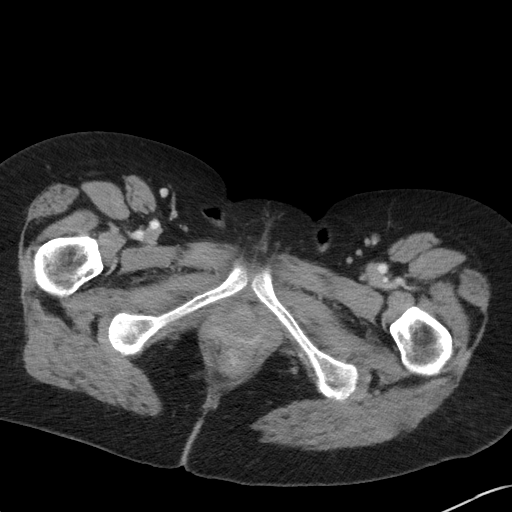
[im 5/81  bone]
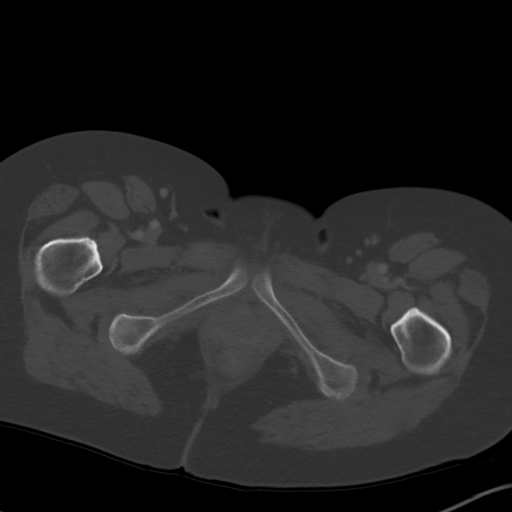
[im 9/81  soft-tissue]
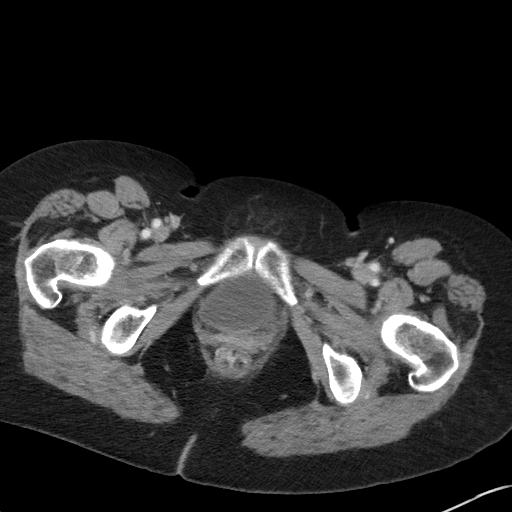
[im 18/81  soft-tissue]
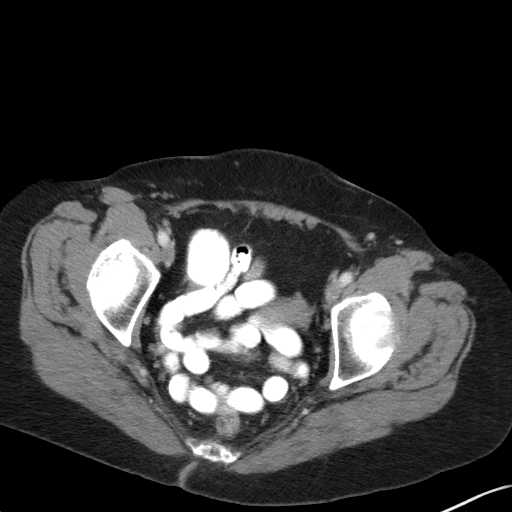
[im 23/81  soft-tissue]
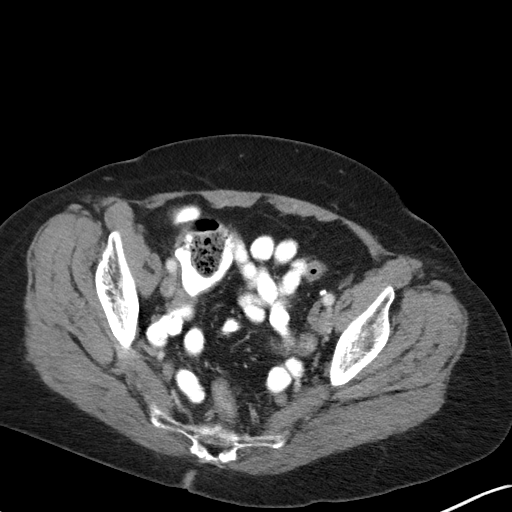
[im 27/81  soft-tissue]
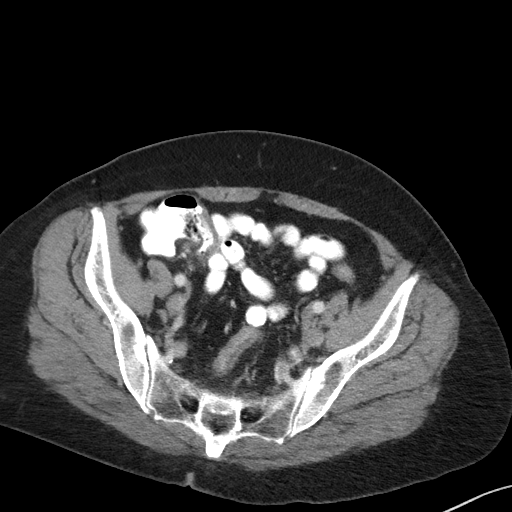
[im 36/81  soft-tissue]
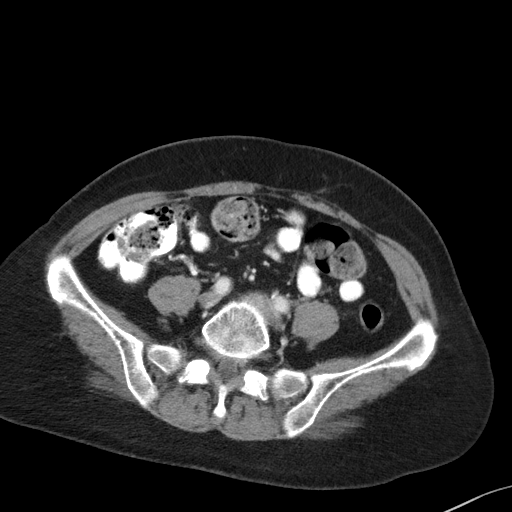
[im 41/81  soft-tissue]
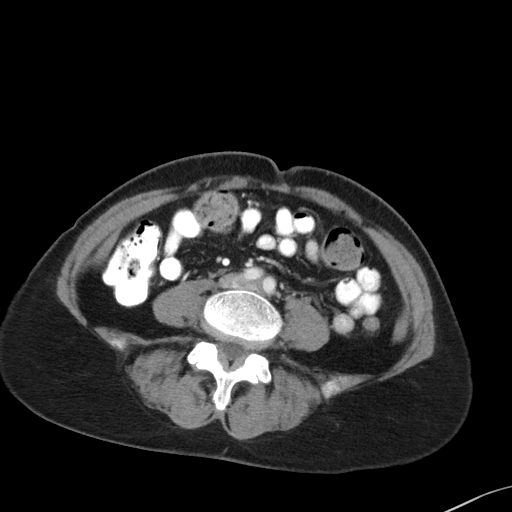
[im 45/81  soft-tissue]
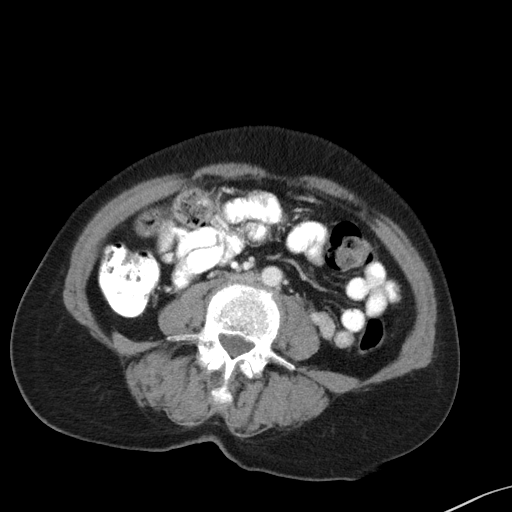
[im 54/81  soft-tissue]
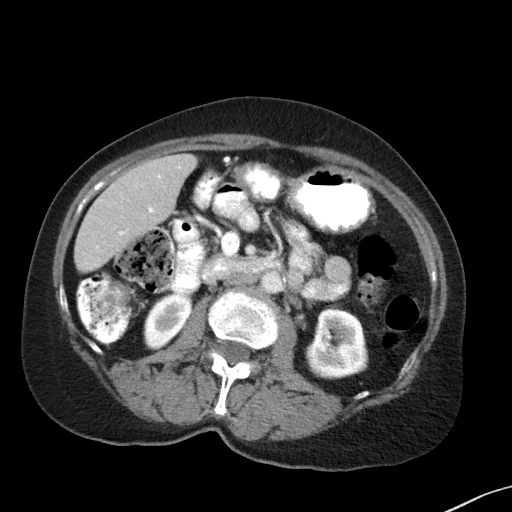
[im 54/81  bone]
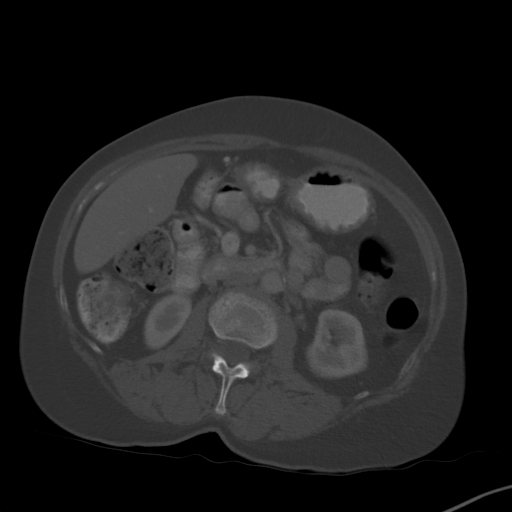
[im 58/81  soft-tissue]
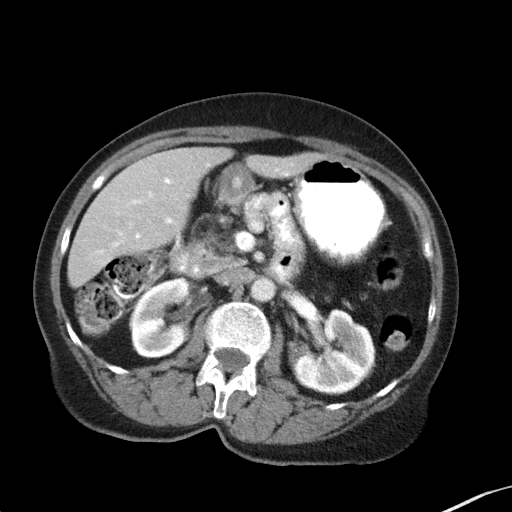
[im 63/81  soft-tissue]
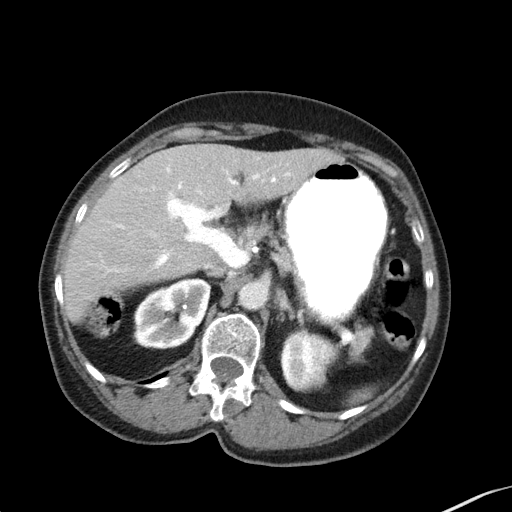
[im 72/81  soft-tissue]
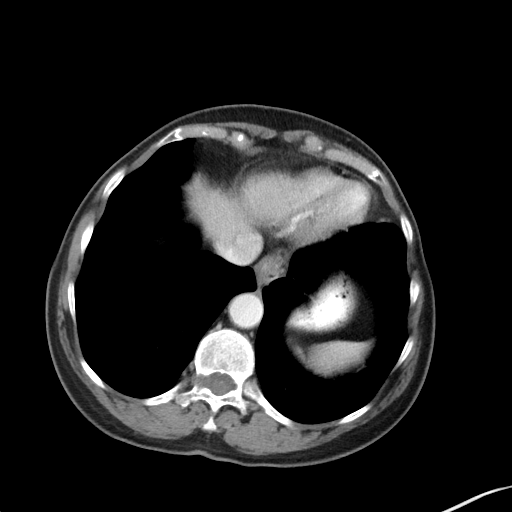
[im 76/81  soft-tissue]
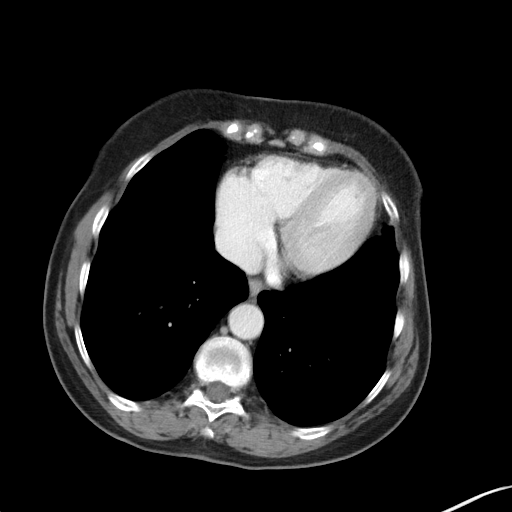

[Series 6: coronal st · coronal · 0.55mm/px · 3 of 92 slices shown]
[im 31/92  soft-tissue]
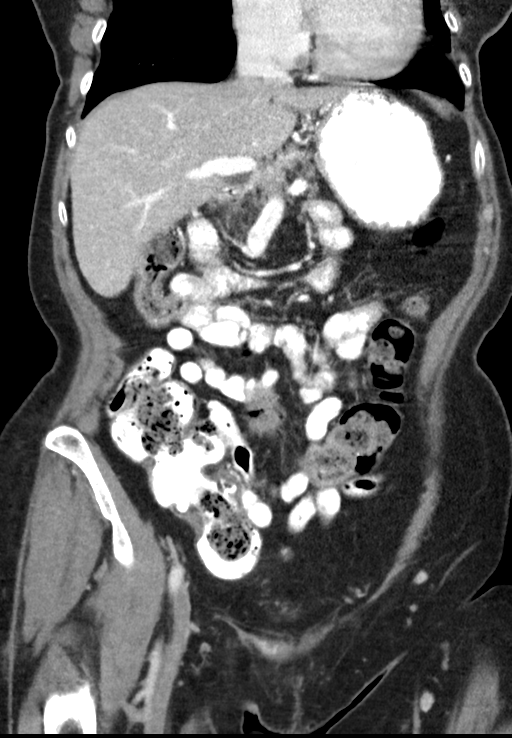
[im 41/92  soft-tissue]
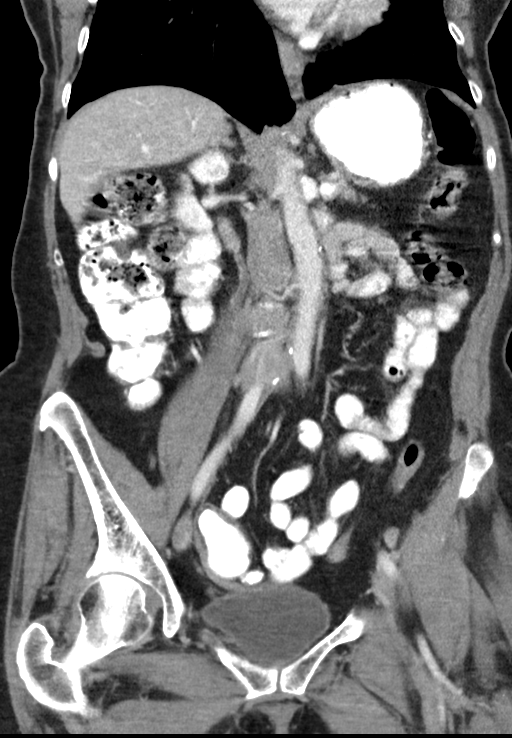
[im 51/92  soft-tissue]
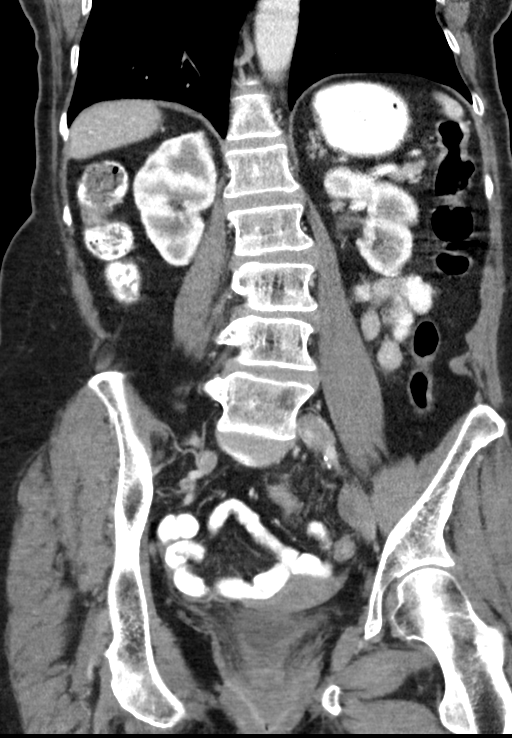

[16 of 46 positions shown; findings below may reference images not displayed]

FINDINGS: Lower chest: No acute abnormality.

Hepatobiliary: Status post cholecystectomy. No biliary dilatation is
noted. Stable left hepatic cysts are noted.

Pancreas: Unremarkable. No pancreatic ductal dilatation or
surrounding inflammatory changes.

Spleen: Normal in size without focal abnormality.

Adrenals/Urinary Tract: Adrenal glands appear normal. Left renal
cyst is noted. Nonobstructive right nephrolithiasis is noted. No
hydronephrosis or renal obstruction is noted. Urinary bladder is
unremarkable.

Stomach/Bowel: Stomach appears normal. There is no evidence of bowel
obstruction or inflammation. Status post appendectomy.

Vascular/Lymphatic: No significant vascular findings are present. No
enlarged abdominal or pelvic lymph nodes.

Reproductive: Uterus and bilateral adnexa are unremarkable.

Other: No abdominal wall hernia or abnormality. No abdominopelvic
ascites.

Musculoskeletal: No acute or significant osseous findings.
IMPRESSION: Nonobstructive right nephrolithiasis. No hydronephrosis or renal
obstruction is noted.

Stable left hepatic cysts.

## 2020-08-17 MED ORDER — IOHEXOL 300 MG/ML  SOLN
100.0000 mL | Freq: Once | INTRAMUSCULAR | Status: AC | PRN
  Administered 2020-08-17: 100 mL via INTRAVENOUS

## 2020-08-21 ENCOUNTER — Encounter (INDEPENDENT_AMBULATORY_CARE_PROVIDER_SITE_OTHER): Payer: Self-pay | Admitting: *Deleted

## 2020-08-22 ENCOUNTER — Telehealth (INDEPENDENT_AMBULATORY_CARE_PROVIDER_SITE_OTHER): Payer: Self-pay | Admitting: Gastroenterology

## 2020-08-22 NOTE — Telephone Encounter (Signed)
Thanks for letting the patient know the findings. I agree, we should proceed with an EGD to evaluate her symptoms further.  Darius Bump,  can you please schedule an EGD? Dx: chronic abdominal pain. Room: 1  Thanks,  [name], MD Gastroenterology and Hepatology [facility]

## 2020-08-22 NOTE — Telephone Encounter (Signed)
Spoke with pt and she wanted results of CT scan I read her Dr. Colman Cater note and let her know that he tried to call and could not get an answer and that a letter was mailed that she will be getting.   Discussed with pt  the results of the CT of the abdomen and pelvis with IV contrast which was only remarkable for presence of nonobstructive right renal stone but no other changes that would explain her abdominal pain per Dr. Jenetta Downer. Pt verbalized understanding and states that her abdominal pain is still about the same and thought the dr mentioned doing EGD or what is the next step.

## 2020-08-22 NOTE — Telephone Encounter (Signed)
Patient called left voice mail regarding her test results - please advise - ph# [phone]

## 2020-08-23 NOTE — Telephone Encounter (Signed)
I called pt and let her know that Kimberly Mccormick would be scheduling an EGD for her.

## 2020-08-24 ENCOUNTER — Other Ambulatory Visit (INDEPENDENT_AMBULATORY_CARE_PROVIDER_SITE_OTHER): Payer: Self-pay

## 2020-08-24 ENCOUNTER — Encounter (INDEPENDENT_AMBULATORY_CARE_PROVIDER_SITE_OTHER): Payer: Self-pay

## 2020-08-24 DIAGNOSIS — G8929 Other chronic pain: Secondary | ICD-10-CM

## 2020-08-25 NOTE — Patient Instructions (Signed)
Kimberly Mccormick  08/25/2020     '@PREFPERIOPPHARMACY'$ @   Your procedure is scheduled on  08/29/2020.   Report to [facility] at  [place].   Call this number if you have problems the morning of surgery:  [phone]   Remember:  Follow the diet instructions given to you by the office.    Take these medicines the morning of surgery with A SIP OF WATER          xanax (if needed), zyrtec, metoprolol, prilosec, mirapex.     Do not wear jewelry, make-up or nail polish.  Do not wear lotions, powders, or perfumes, or deodorant.  Do not shave 48 hours prior to surgery.  Men may shave face and neck.  Do not bring valuables to the hospital.  [facility] is not responsible for any belongings or valuables.  Contacts, dentures or bridgework may not be worn into surgery.  Leave your suitcase in the car.  After surgery it may be brought to your room.  For patients admitted to the hospital, discharge time will be determined by your treatment team.  Patients discharged the day of surgery will not be allowed to drive home and must have someone with them for 24 hours.    Special instructions:   DO NOT smoke tobacco or vape for 24 hours before your procedure.  Please read over the following fact sheets that you were given. Anesthesia Post-op Instructions and Care and Recovery After Surgery      Upper Endoscopy, Adult, Care After This sheet gives you information about how to care for yourself after your procedure. Your health care provider may also give you more specific instructions. If you have problems or questions, contact your health careprovider. What can I expect after the procedure? After the procedure, it is common to have: A sore throat. Mild stomach pain or discomfort. Bloating. Nausea. Follow these instructions at home:  Follow instructions from your health care provider about what to eat or drink after your procedure. Return to your normal activities as told by your  health care provider. Ask your health care provider what activities are safe for you. Take over-the-counter and prescription medicines only as told by your health care provider. If you were given a sedative during the procedure, it can affect you for several hours. Do not drive or operate machinery until your health care provider says that it is safe. Keep all follow-up visits as told by your health care provider. This is important. Contact a health care provider if you have: A sore throat that lasts longer than one day. Trouble swallowing. Get help right away if: You vomit blood or your vomit looks like coffee grounds. You have: A fever. Bloody, black, or tarry stools. A severe sore throat or you cannot swallow. Difficulty breathing. Severe pain in your chest or abdomen. Summary After the procedure, it is common to have a sore throat, mild stomach discomfort, bloating, and nausea. If you were given a sedative during the procedure, it can affect you for several hours. Do not drive or operate machinery until your health care provider says that it is safe. Follow instructions from your health care provider about what to eat or drink after your procedure. Return to your normal activities as told by your health care provider. This information is not intended to replace advice given to you by your health care provider. Make sure you discuss any questions you have with your healthcare  provider. Document Revised: 01/05/2019 Document Reviewed: 06/09/2017 Elsevier Patient Education  2022 [place] After This sheet gives you information about how to care for yourself after your procedure. Your health care provider may also give you more specific instructions. If you have problems or questions, contact your health careprovider. What can I expect after the procedure? After the procedure, it is common to have: Tiredness. Forgetfulness about what happened after the  procedure. Impaired judgment for important decisions. Nausea or vomiting. Some difficulty with balance. Follow these instructions at home: For the time period you were told by your health care provider:     Rest as needed. Do not participate in activities where you could fall or become injured. Do not drive or use machinery. Do not drink alcohol. Do not take sleeping pills or medicines that cause drowsiness. Do not make important decisions or sign legal documents. Do not take care of children on your own. Eating and drinking Follow the diet that is recommended by your health care provider. Drink enough fluid to keep your urine pale yellow. If you vomit: Drink water, juice, or soup when you can drink without vomiting. Make sure you have little or no nausea before eating solid foods. General instructions Have a responsible adult stay with you for the time you are told. It is important to have someone help care for you until you are awake and alert. Take over-the-counter and prescription medicines only as told by your health care provider. If you have sleep apnea, surgery and certain medicines can increase your risk for breathing problems. Follow instructions from your health care provider about wearing your sleep device: Anytime you are sleeping, including during daytime naps. While taking prescription pain medicines, sleeping medicines, or medicines that make you drowsy. Avoid smoking. Keep all follow-up visits as told by your health care provider. This is important. Contact a health care provider if: You keep feeling nauseous or you keep vomiting. You feel light-headed. You are still sleepy or having trouble with balance after 24 hours. You develop a rash. You have a fever. You have redness or swelling around the IV site. Get help right away if: You have trouble breathing. You have new-onset confusion at home. Summary For several hours after your procedure, you may feel  tired. You may also be forgetful and have poor judgment. Have a responsible adult stay with you for the time you are told. It is important to have someone help care for you until you are awake and alert. Rest as told. Do not drive or operate machinery. Do not drink alcohol or take sleeping pills. Get help right away if you have trouble breathing, or if you suddenly become confused. This information is not intended to replace advice given to you by your health care provider. Make sure you discuss any questions you have with your healthcare provider. Document Revised: 09/23/2019 Document Reviewed: 12/10/2018 Elsevier Patient Education  2022 [employer].

## 2020-08-28 ENCOUNTER — Encounter (HOSPITAL_COMMUNITY)
Admission: RE | Admit: 2020-08-28 | Discharge: 2020-08-28 | Disposition: A | Discharge status: Home / Self Care | Payer: Medicare Other | Source: Ambulatory Visit | Attending: Gastroenterology | Admitting: Gastroenterology

## 2020-08-28 ENCOUNTER — Other Ambulatory Visit: Payer: Self-pay

## 2020-08-28 ENCOUNTER — Encounter (HOSPITAL_COMMUNITY): Payer: Self-pay

## 2020-08-28 DIAGNOSIS — Z01818 Encounter for other preprocedural examination: Secondary | ICD-10-CM | POA: Diagnosis not present

## 2020-08-29 ENCOUNTER — Ambulatory Visit (HOSPITAL_COMMUNITY): Payer: Medicare Other | Admitting: Anesthesiology

## 2020-08-29 ENCOUNTER — Other Ambulatory Visit: Payer: Self-pay

## 2020-08-29 ENCOUNTER — Encounter (HOSPITAL_COMMUNITY): Payer: Self-pay | Admitting: Gastroenterology

## 2020-08-29 ENCOUNTER — Ambulatory Visit (HOSPITAL_COMMUNITY)
Admission: RE | Admit: 2020-08-29 | Discharge: 2020-08-29 | Disposition: A | Discharge status: Home / Self Care | Payer: Medicare Other | Source: Ambulatory Visit | Attending: Gastroenterology | Admitting: Gastroenterology

## 2020-08-29 ENCOUNTER — Encounter (HOSPITAL_COMMUNITY)
Admission: RE | Disposition: A | Discharge status: Home / Self Care | Payer: Self-pay | Source: Ambulatory Visit | Attending: Gastroenterology

## 2020-08-29 DIAGNOSIS — Z91012 Allergy to eggs: Secondary | ICD-10-CM | POA: Diagnosis not present

## 2020-08-29 DIAGNOSIS — I110 Hypertensive heart disease with heart failure: Secondary | ICD-10-CM | POA: Diagnosis not present

## 2020-08-29 DIAGNOSIS — K317 Polyp of stomach and duodenum: Secondary | ICD-10-CM | POA: Diagnosis not present

## 2020-08-29 DIAGNOSIS — R1012 Left upper quadrant pain: Secondary | ICD-10-CM | POA: Insufficient documentation

## 2020-08-29 DIAGNOSIS — K449 Diaphragmatic hernia without obstruction or gangrene: Secondary | ICD-10-CM | POA: Diagnosis not present

## 2020-08-29 DIAGNOSIS — I509 Heart failure, unspecified: Secondary | ICD-10-CM | POA: Diagnosis not present

## 2020-08-29 DIAGNOSIS — D132 Benign neoplasm of duodenum: Secondary | ICD-10-CM | POA: Insufficient documentation

## 2020-08-29 DIAGNOSIS — K222 Esophageal obstruction: Secondary | ICD-10-CM | POA: Insufficient documentation

## 2020-08-29 DIAGNOSIS — Z9221 Personal history of antineoplastic chemotherapy: Secondary | ICD-10-CM | POA: Diagnosis not present

## 2020-08-29 DIAGNOSIS — Z880 Allergy status to penicillin: Secondary | ICD-10-CM | POA: Diagnosis not present

## 2020-08-29 DIAGNOSIS — Z91011 Allergy to milk products: Secondary | ICD-10-CM | POA: Diagnosis not present

## 2020-08-29 DIAGNOSIS — Z853 Personal history of malignant neoplasm of breast: Secondary | ICD-10-CM | POA: Insufficient documentation

## 2020-08-29 DIAGNOSIS — Z79899 Other long term (current) drug therapy: Secondary | ICD-10-CM | POA: Insufficient documentation

## 2020-08-29 DIAGNOSIS — Z923 Personal history of irradiation: Secondary | ICD-10-CM | POA: Insufficient documentation

## 2020-08-29 DIAGNOSIS — R109 Unspecified abdominal pain: Secondary | ICD-10-CM | POA: Diagnosis not present

## 2020-08-29 DIAGNOSIS — K589 Irritable bowel syndrome without diarrhea: Secondary | ICD-10-CM | POA: Insufficient documentation

## 2020-08-29 DIAGNOSIS — G8929 Other chronic pain: Secondary | ICD-10-CM

## 2020-08-29 HISTORY — PX: SUBMUCOSAL LIFTING INJECTION: SHX6855

## 2020-08-29 HISTORY — PX: POLYPECTOMY: SHX5525

## 2020-08-29 HISTORY — PX: ESOPHAGOGASTRODUODENOSCOPY (EGD) WITH PROPOFOL: SHX5813

## 2020-08-29 HISTORY — PX: BIOPSY: SHX5522

## 2020-08-29 SURGERY — ESOPHAGOGASTRODUODENOSCOPY (EGD) WITH PROPOFOL
Anesthesia: General

## 2020-08-29 MED ORDER — LIDOCAINE HCL (CARDIAC) PF 100 MG/5ML IV SOSY
PREFILLED_SYRINGE | INTRAVENOUS | Status: DC | PRN
  Administered 2020-08-29: 50 mg via INTRAVENOUS

## 2020-08-29 MED ORDER — PROPOFOL 10 MG/ML IV BOLUS
INTRAVENOUS | Status: DC | PRN
  Administered 2020-08-29: 100 mg via INTRAVENOUS
  Administered 2020-08-29: 50 mg via INTRAVENOUS

## 2020-08-29 MED ORDER — PROPOFOL 500 MG/50ML IV EMUL
INTRAVENOUS | Status: DC | PRN
  Administered 2020-08-29: 150 ug/kg/min via INTRAVENOUS

## 2020-08-29 MED ORDER — STERILE WATER FOR IRRIGATION IR SOLN
Status: DC | PRN
  Administered 2020-08-29: 100 mL

## 2020-08-29 MED ORDER — OMEPRAZOLE 40 MG PO CPDR: 40.0000 mg | DELAYED_RELEASE_CAPSULE | Freq: Two times a day (BID) | ORAL | 0 refills | Status: DC

## 2020-08-29 MED ORDER — LACTATED RINGERS IV SOLN: INTRAVENOUS | Status: DC

## 2020-08-29 NOTE — Transfer of Care (Signed)
Immediate Anesthesia Transfer of Care Note  Patient: Kimberly Mccormick  Procedure(s) Performed: ESOPHAGOGASTRODUODENOSCOPY (EGD) WITH PROPOFOL POLYPECTOMY SUBMUCOSAL LIFTING INJECTION BIOPSY  Patient Location: Short Stay  Anesthesia Type:General  Level of Consciousness: awake and oriented  Airway & Oxygen Therapy: Patient Spontanous Breathing  Post-op Assessment: Report given to RN and Post -op Vital signs reviewed and stable  Post vital signs: Reviewed and stable  Last Vitals:  Vitals Value Taken Time  BP    Temp    Pulse    Resp    SpO2      Last Pain:  Vitals:   08/29/20 1130  TempSrc:   PainSc: 0-No pain      Patients Stated Pain Goal: 2 ([id number])  Complications: No notable events documented.

## 2020-08-29 NOTE — Anesthesia Procedure Notes (Signed)
Date/Time: 08/29/2020 11:32 AM Performed by: Orlie Dakin, CRNA Pre-anesthesia Checklist: Patient identified, Emergency Drugs available, Suction available and Patient being monitored Patient Re-evaluated:Patient Re-evaluated prior to induction Oxygen Delivery Method: Nasal cannula Induction Type: IV induction Placement Confirmation: positive ETCO2

## 2020-08-29 NOTE — Op Note (Addendum)
[facility] Patient Name: Kimberly Mccormick Procedure Date: 08/29/2020 11:18 AM MRN: [id number] Date of Birth: 25-May-1950 Attending MD: [name] ,  CSN: [id number] Age: 70 Admit Type: Outpatient Procedure:                Upper GI endoscopy Indications:              Abdominal pain in the left upper quadrant Providers:                [name], Lambert Mody, Raphael Gibney,                            Technician Referring MD:              Medicines:                Monitored Anesthesia Care Complications:            No immediate complications. Estimated Blood Loss:     Estimated blood loss: none. Procedure:                Pre-Anesthesia Assessment:                           - Prior to the procedure, a History and Physical                            was performed, and patient medications, allergies                            and sensitivities were reviewed. The patient's                            tolerance of previous anesthesia was reviewed.                           - The risks and benefits of the procedure and the                            sedation options and risks were discussed with the                            patient. All questions were answered and informed                            consent was obtained.                           - ASA Grade Assessment: II - A patient with mild                            systemic disease.                           After obtaining informed consent, the endoscope was                            passed under direct vision. Throughout the  procedure, the patient's blood pressure, pulse, and                            oxygen saturations were monitored continuously. The                            [id number] scope was introduced through the                            mouth, and advanced to the second part of duodenum.                            The upper GI endoscopy was accomplished without                             difficulty. The patient tolerated the procedure                            well. Scope In: 11:32:46 AM Scope Out: 12:02:59 PM Total Procedure Duration: 0 hours 30 minutes 13 seconds  Findings:      A non-obstructing Schatzki ring was found at the gastroesophageal       junction.      A 2 cm hiatal hernia was present.      The entire examined stomach was normal.      A single 15 mm flat polyp with no bleeding was found in the duodenal       bulb. Area was unsuccessfully injected with 2 mL Eleview for lesion       assessment, but the lesion could not be lifted adequately in its       completion . Imaging was performed using white light and narrow band       imaging to visualize the mucosa and demarcate the polyp site after       injection for EMR purposes. I proceeded to remove the polyp in a       piecemeal fasion with a cold snare and Jumbo forceps. Resection and       retrieval were complete. There was no active bleeding after polypectomy       was performed, no clips were placed.      The examined duodenum was normal. Biopsies were taken with a cold       forceps for histology. Impression:               .                           - Non-obstructing Schatzki ring.                           - 2 cm hiatal hernia.                           - Normal stomach.                           - A single duodenal polyp. Resected and retrieved.                           -  Normal examined duodenum. Biopsied. Moderate Sedation:      Per Anesthesia Care Recommendation:           - Discharge patient to home (ambulatory).                           - Resume previous diet.                           - Await pathology results.                           - Repeat upper endoscopy after studies are complete                            for surveillance, possibly in one year.                           - Take Prilosec 40 mg twice a day for 4 weeks, then                            go back to  once a day. Procedure Code(s):        --- Professional ---                           [phone], Esophagogastroduodenoscopy, flexible,                            transoral; with removal of tumor(s), polyp(s), or                            other lesion(s) by snare technique                           43236, Esophagogastroduodenoscopy, flexible,                            transoral; with directed submucosal injection(s),                            any substance Diagnosis Code(s):        --- Professional ---                           K31.7, Polyp of stomach and duodenum                           R10.12, Left upper quadrant pain CPT copyright 2019 [employer]. All rights reserved. The codes documented in this report are preliminary and upon coder review may  be revised to meet current compliance requirements. [name], MD [name],  08/29/2020 12:21:15 PM This report has been signed electronically. Number of Addenda: 0

## 2020-08-29 NOTE — Anesthesia Postprocedure Evaluation (Signed)
Anesthesia Post Note  Patient: Kimberly Mccormick  Procedure(s) Performed: ESOPHAGOGASTRODUODENOSCOPY (EGD) WITH PROPOFOL POLYPECTOMY SUBMUCOSAL LIFTING INJECTION BIOPSY  Patient location during evaluation: PACU Anesthesia Type: General Level of consciousness: awake and alert Pain management: pain level controlled Vital Signs Assessment: post-procedure vital signs reviewed and stable Respiratory status: spontaneous breathing, nonlabored ventilation, respiratory function stable and patient connected to nasal cannula oxygen Cardiovascular status: blood pressure returned to baseline and stable Postop Assessment: no apparent nausea or vomiting Anesthetic complications: no   No notable events documented.   Last Vitals:  Vitals:   08/29/20 1043 08/29/20 1208  BP: (!) 150/76 128/69  Pulse: (!) 57 60  Resp: 20 16  Temp: 37.2 C 37.1 C  SpO2: 99% 96%    Last Pain:  Vitals:   08/29/20 1208  TempSrc: Oral  PainSc: 0-No pain                 Nicanor Alcon

## 2020-08-29 NOTE — H&P (Signed)
Kimberly Mccormick is an 70 y.o. female.   Chief Complaint:  HPI: KATHRIN SANDROCK is a 70 y.o. female with PMH breast cancer s/p , CHF, IBS, hyperlipidemia, hypertension, who presents for f evaluation of abdominal pain.  Patient reports chronic intermittent episodes of LUQ abdominal pain for the last 5 years at the very least.  She has had recent investigations with a CT of the abdomen and pelvis with IV contrast that was completely normal.  No nausea, vomiting, fever, chills, melena or hematochezia.  Past Medical History:  Diagnosis Date   Anemia    iron def   Breast cancer ([place])    left, Status post chemotherapy and XRT   CHF (congestive heart failure) (HCC)    DDD (degenerative disc disease), lumbar    Essential hypertension    Essential tremor    Genital warts    GERD (gastroesophageal reflux disease)    History of anemia    History of renal insufficiency    Hyperlipidemia    Hypertension    Insomnia    Mixed hyperlipidemia    Osteoarthritis    Otitis media    right   Peripheral neuropathy    d/t chemo   Sinus infection 01/04/14    Past Surgical History:  Procedure Laterality Date   APPENDECTOMY  1966   BREAST LUMPECTOMY WITH RADIOACTIVE SEED AND SENTINEL LYMPH NODE BIOPSY Left 05/06/2018   Procedure: LEFT BREAST LUMPECTOMY WITH RADIOACTIVE SEED AND LEFT DEEP AXILLARY SENTINEL LYMPH NODE BIOPSY AND BLUE DYE INJECTION;  Surgeon: Fanny Skates, MD;  Location: [place];  Service: General;  Laterality: Left;   CATARACT EXTRACTION W/PHACO Left 01/10/2014   Procedure: CATARACT EXTRACTION PHACO AND INTRAOCULAR LENS PLACEMENT ; CDE:  4.94;  Surgeon: Williams Che, MD;  Location: [facility];  Service: Ophthalmology;  Laterality: Left;   CESAREAN SECTION  1986   CHOLECYSTECTOMY     COLONOSCOPY WITH PROPOFOL N/A 07/25/2020   Procedure: COLONOSCOPY WITH PROPOFOL;  Surgeon: Harvel Quale, MD;  [facility]: [facility];  Service: Gastroenterology;  Laterality:  N/A;  10:55   MYRINGOTOMY WITH TUBE PLACEMENT Right 02/04/2017   Procedure: REVISION OF RIGHT MYRINGOTOMY WITH TUBE PLACEMENT, WITH EXAM OF LEFT EAR;  Surgeon: Leta Baptist, MD;  Location: [place];  Service: ENT;  Laterality: Right;   NASAL SINUS SURGERY  2016   polypectomy   POLYPECTOMY  07/25/2020   Procedure: POLYPECTOMY;  Surgeon: Harvel Quale, MD;  [facility]: [facility];  Service: Gastroenterology;;   [facility] REMOVAL N/A 01/06/2019   Procedure: REMOVAL PORT-A-CATH;  Surgeon: Fanny Skates, MD;  Location: [place];  Service: General;  Laterality: N/A;   PORTACATH PLACEMENT Right 06/16/2018   Procedure: INSERTION PORT-A-CATH;  Surgeon: Fanny Skates, MD;  Location: [place];  Service: General;  Laterality: Right;   TONSILLECTOMY  1970    Family History  Problem Relation Age of Onset   Lupus Mother    Heart attack Father        age 20   Prostate cancer Father    Hypothyroidism Sister    Breast cancer Sister    Hypertension Sister    Hypertension Brother    Social History:  reports that she has never smoked. She has never used smokeless tobacco. She reports that she does not drink alcohol and does not use drugs.  Allergies:  Allergies  Allergen Reactions   Eggs Or Egg-Derived Products Nausea And Vomiting   Other  Nausea And Vomiting    Dairy products\    Penicillins Hives    Has patient had a PCN reaction causing immediate rash, facial/tongue/throat swelling, SOB or lightheadedness with hypotension: No Has patient had a PCN reaction causing severe rash involving mucus membranes or skin necrosis: No Has patient had a PCN reaction that required hospitalization: No Has patient had a PCN reaction occurring within the last 10 years: No If all of the above answers are "NO", then may proceed with Cephalosporin use.     Medications Prior to Admission  Medication Sig Dispense Refill   acyclovir (ZOVIRAX) 400 MG  tablet Take 400 mg by mouth 2 (two) times daily.      ALPRAZolam (XANAX) 0.5 MG tablet Take 0.5 mg by mouth every 8 (eight) hours as needed for anxiety.     cetirizine (ZYRTEC) 10 MG tablet Take 10 mg by mouth daily.     Cholecalciferol (VITAMIN D) 2000 UNITS tablet Take 2,000 Units by mouth daily.     Cyanocobalamin 5000 MCG TBDP Take 5,000 mcg by mouth daily.     ferrous sulfate 325 (65 FE) MG tablet Take 325 mg by mouth daily with breakfast.     hyoscyamine (LEVSIN SL) 0.125 MG SL tablet Place 1 tablet (0.125 mg total) under the tongue every 6 (six) hours as needed (abdominal pain). 30 tablet 3   metoprolol succinate (TOPROL-XL) 25 MG 24 hr tablet Take 25 mg by mouth daily.  3   omeprazole (PRILOSEC) 40 MG capsule Take 40 mg by mouth daily.     pramipexole (MIRAPEX) 0.125 MG tablet Take 1 pill once daily for 3 days, then 1 pill twice daily for 3 days, then 1 pill three times a day for 3 days, then 2 pills 3 times a day for 1 week, then 3 pills 3 times a day thereafter. (Patient taking differently: Take 0.375 mg by mouth 3 (three) times daily.) 270 tablet 5   zolpidem (AMBIEN) 10 MG tablet Take 5 mg by mouth at bedtime as needed for sleep.      No results found for this or any previous visit (from the past 48 hour(s)). No results found.  Review of Systems  Constitutional: Negative.   HENT: Negative.    Eyes: Negative.   Respiratory: Negative.    Cardiovascular: Negative.   Gastrointestinal:  Positive for abdominal pain.  Endocrine: Negative.   Genitourinary: Negative.   Musculoskeletal: Negative.   Skin: Negative.   Allergic/Immunologic: Negative.   Neurological: Negative.   Hematological: Negative.   Psychiatric/Behavioral: Negative.     Blood pressure (!) 150/76, pulse (!) 57, temperature 98.9 F (37.2 C), temperature source Oral, resp. rate 20, SpO2 99 %. Physical Exam  GENERAL: The patient is AO x3, in no acute distress. HEENT: Head is normocephalic and atraumatic. EOMI are  intact. Mouth is well hydrated and without lesions. NECK: Supple. No masses LUNGS: Clear to auscultation. No presence of rhonchi/wheezing/rales. Adequate chest expansion HEART: RRR, normal s1 and s2. ABDOMEN: mildlly tender in the LUQ, no guarding, no peritoneal signs, and nondistended. BS +. No masses. EXTREMITIES: Without any cyanosis, clubbing, rash, lesions or edema. NEUROLOGIC: AOx3, no focal motor deficit. SKIN: no jaundice, no rashes  Assessment/Plan THATIANA OKINO is a 70 y.o. female with PMH breast cancer s/p , CHF, IBS, hyperlipidemia, hypertension, who presents for f evaluation of abdominal pain.  We will proceed with EGD.  Harvel Quale, MD 08/29/2020, 11:26 AM

## 2020-08-29 NOTE — Anesthesia Preprocedure Evaluation (Addendum)
Anesthesia Evaluation  Patient identified by MRN, date of birth, ID band Patient awake    Reviewed: Allergy & Precautions, NPO status , Patient's Chart, lab work & pertinent test results, reviewed documented beta blocker date and time   History of Anesthesia Complications Negative for: history of anesthetic complications  Airway Mallampati: II  TM Distance: >3 FB Neck ROM: Full    Dental  (+) Upper Dentures, Lower Dentures, Dental Advisory Given   Pulmonary neg pulmonary ROS,    Pulmonary exam normal breath sounds clear to auscultation       Cardiovascular Exercise Tolerance: Good hypertension, Pt. on medications and Pt. on home beta blockers +CHF  Normal cardiovascular exam Rhythm:Regular Rate:Normal  14-Feb-2019 18:26:04 [facility] ROUTINE RECORD Sinus rhythm Nonspecific T wave abnormality No significant change since last tracing Confirmed by Lajean Saver [phone]) on 02/14/2019   01/29/19 Positive stress test  02/14/19 - cardiology note Assessment and Plan:  1.  Abnormal ECG as outlined above, noted incidentally per anesthesia evaluation prior to Port-A-Cath removal in December 2020.  She does not describe any anginal type chest pain, has atypical symptoms that she states are most noticeable since her diagnosis and treatment for left breast cancer.  She does have coronary artery calcifications by chest CT imaging, however recent cardiac testing arranged by Dr. Pleas Koch is overall reassuring.  She has normal LVEF, no residual pericardial effusion by echocardiography, and no evidence of scar or ischemia by Myoview.  I would suggest risk factor modification strategies, specifically keeping blood pressure in control and maintaining follow-up of lipids with goal LDL 70 or less.  No additional cardiac testing is planned at this time, she should keep follow-up with Dr. Pleas Koch for now.  We discussed warning signs and  symptoms that might prompt further cardiac assessment   Neuro/Psych Peripheral neuropathy  Neuromuscular disease (tremor)    GI/Hepatic Neg liver ROS, GERD  Medicated and Controlled,  Endo/Other  negative endocrine ROS  Renal/GU Renal InsufficiencyRenal disease     Musculoskeletal  (+) Arthritis , Osteoarthritis,    Abdominal   Peds  Hematology  (+) anemia ,   Anesthesia Other Findings Breast cancer   left   Reproductive/Obstetrics                            Anesthesia Physical  Anesthesia Plan  ASA: 3  Anesthesia Plan: General   Post-op Pain Management:    Induction: Intravenous  PONV Risk Score and Plan: Propofol infusion and TIVA  Airway Management Planned: Nasal Cannula, Natural Airway and Simple Face Mask  Additional Equipment:   Intra-op Plan:   Post-operative Plan:   Informed Consent: I have reviewed the patients History and Physical, chart, labs and discussed the procedure including the risks, benefits and alternatives for the proposed anesthesia with the patient or authorized representative who has indicated his/her understanding and acceptance.       Plan Discussed with: CRNA  Anesthesia Plan Comments:         Anesthesia Quick Evaluation

## 2020-08-29 NOTE — Discharge Instructions (Addendum)
You are being discharged to home.  Resume your previous diet.  We are waiting for your pathology results.  Your physician has recommended a repeat upper endoscopy after studies are complete for surveillance, possibly in one year.  Take Prilosec 40 mg twice a day for 4 weeks, then go back to once a day.  PATIENT INSTRUCTIONS POST-ANESTHESIA  IMMEDIATELY FOLLOWING SURGERY:  Do not drive or operate machinery for the first twenty four hours after surgery.  Do not make any important decisions for twenty four hours after surgery or while taking narcotic pain medications or sedatives.  If you develop intractable nausea and vomiting or a severe headache please notify your doctor immediately.  FOLLOW-UP:  Please make an appointment with your surgeon as instructed. You do not need to follow up with anesthesia unless specifically instructed to do so.  WOUND CARE INSTRUCTIONS (if applicable):  Keep a dry clean dressing on the anesthesia/puncture wound site if there is drainage.  Once the wound has quit draining you may leave it open to air.  Generally you should leave the bandage intact for twenty four hours unless there is drainage.  If the epidural site drains for more than 36-48 hours please call the anesthesia department.  QUESTIONS?:  Please feel free to call your physician or the hospital operator if you have any questions, and they will be happy to assist you.

## 2020-08-30 LAB — SURGICAL PATHOLOGY

## 2020-09-06 ENCOUNTER — Encounter (HOSPITAL_COMMUNITY): Payer: Self-pay | Admitting: Gastroenterology

## 2020-10-03 ENCOUNTER — Telehealth: Payer: Self-pay | Admitting: Neurology

## 2020-10-03 ENCOUNTER — Other Ambulatory Visit: Payer: Self-pay | Admitting: *Deleted

## 2020-10-03 DIAGNOSIS — G20 Parkinson's disease: Secondary | ICD-10-CM

## 2020-10-03 MED ORDER — PRAMIPEXOLE DIHYDROCHLORIDE 0.125 MG PO TABS
0.3750 mg | ORAL_TABLET | Freq: Three times a day (TID) | ORAL | 1 refills | Status: DC
Start: 2020-10-03 — End: 2020-12-07

## 2020-10-03 NOTE — Telephone Encounter (Signed)
Pt called requesting refill for pramipexole (MIRAPEX) 0.125 MG tablet. [facility] [phone].

## 2020-10-03 NOTE — Telephone Encounter (Signed)
EMR states pt is taking 0.375 mg TID which is goal dose initially prescribed by Dr Rexene Alberts. Refill x 2 sent in to pharmacy as requested. Pt has pending appt with Dr Rexene Alberts on 12/07/20.

## 2020-10-03 NOTE — Telephone Encounter (Signed)
Refills sent to this AM.

## 2020-10-16 DIAGNOSIS — I10 Essential (primary) hypertension: Secondary | ICD-10-CM | POA: Diagnosis not present

## 2020-10-16 DIAGNOSIS — K219 Gastro-esophageal reflux disease without esophagitis: Secondary | ICD-10-CM | POA: Diagnosis not present

## 2020-10-16 DIAGNOSIS — R5383 Other fatigue: Secondary | ICD-10-CM | POA: Diagnosis not present

## 2020-10-16 DIAGNOSIS — E782 Mixed hyperlipidemia: Secondary | ICD-10-CM | POA: Diagnosis not present

## 2020-10-16 DIAGNOSIS — E7849 Other hyperlipidemia: Secondary | ICD-10-CM | POA: Diagnosis not present

## 2020-10-16 DIAGNOSIS — Z1329 Encounter for screening for other suspected endocrine disorder: Secondary | ICD-10-CM | POA: Diagnosis not present

## 2020-10-18 DIAGNOSIS — I10 Essential (primary) hypertension: Secondary | ICD-10-CM | POA: Diagnosis not present

## 2020-10-18 DIAGNOSIS — C50912 Malignant neoplasm of unspecified site of left female breast: Secondary | ICD-10-CM | POA: Diagnosis not present

## 2020-10-18 DIAGNOSIS — E7849 Other hyperlipidemia: Secondary | ICD-10-CM | POA: Diagnosis not present

## 2020-10-18 DIAGNOSIS — G20 Parkinson's disease: Secondary | ICD-10-CM | POA: Diagnosis not present

## 2020-10-18 DIAGNOSIS — D509 Iron deficiency anemia, unspecified: Secondary | ICD-10-CM | POA: Diagnosis not present

## 2020-10-18 DIAGNOSIS — D132 Benign neoplasm of duodenum: Secondary | ICD-10-CM | POA: Diagnosis not present

## 2020-10-18 DIAGNOSIS — D126 Benign neoplasm of colon, unspecified: Secondary | ICD-10-CM | POA: Diagnosis not present

## 2020-11-09 ENCOUNTER — Ambulatory Visit (INDEPENDENT_AMBULATORY_CARE_PROVIDER_SITE_OTHER): Payer: Medicare Other | Admitting: Gastroenterology

## 2020-11-27 ENCOUNTER — Ambulatory Visit (INDEPENDENT_AMBULATORY_CARE_PROVIDER_SITE_OTHER): Payer: Medicare Other | Admitting: Gastroenterology

## 2020-11-27 ENCOUNTER — Other Ambulatory Visit: Payer: Self-pay

## 2020-11-27 ENCOUNTER — Encounter (INDEPENDENT_AMBULATORY_CARE_PROVIDER_SITE_OTHER): Payer: Self-pay | Admitting: Gastroenterology

## 2020-11-27 VITALS — BP 147/95 | HR 65 | Temp 98.1°F | Ht 63.0 in | Wt 132.8 lb

## 2020-11-27 DIAGNOSIS — G8929 Other chronic pain: Secondary | ICD-10-CM

## 2020-11-27 DIAGNOSIS — R109 Unspecified abdominal pain: Secondary | ICD-10-CM | POA: Diagnosis not present

## 2020-11-27 DIAGNOSIS — M5136 Other intervertebral disc degeneration, lumbar region: Secondary | ICD-10-CM | POA: Diagnosis not present

## 2020-11-27 DIAGNOSIS — K219 Gastro-esophageal reflux disease without esophagitis: Secondary | ICD-10-CM | POA: Diagnosis not present

## 2020-11-27 DIAGNOSIS — R6881 Early satiety: Secondary | ICD-10-CM | POA: Diagnosis not present

## 2020-11-27 DIAGNOSIS — Z6823 Body mass index (BMI) 23.0-23.9, adult: Secondary | ICD-10-CM | POA: Diagnosis not present

## 2020-11-27 DIAGNOSIS — R053 Chronic cough: Secondary | ICD-10-CM | POA: Diagnosis not present

## 2020-11-27 DIAGNOSIS — J329 Chronic sinusitis, unspecified: Secondary | ICD-10-CM | POA: Diagnosis not present

## 2020-11-27 MED ORDER — OMEPRAZOLE 40 MG PO CPDR: 40.0000 mg | DELAYED_RELEASE_CAPSULE | Freq: Every day | ORAL | 3 refills | Status: DC

## 2020-11-27 NOTE — Patient Instructions (Signed)
Schedule gastric emptying study Start omeprazole 40 mg every day Instruction provided in the use of antireflux medication - patient should take medication in the morning 30-45 minutes before eating breakfast.  Restart low FODMAP diet

## 2020-11-27 NOTE — Progress Notes (Signed)
Kimberly Mccormick, M.D. Gastroenterology & Hepatology [facility] [address] [place], [place] [address]  Primary Care Physician: Curlene Labrum, MD [place] [id number]  I will communicate my assessment and recommendations to the referring MD via EMR.  Problems: Chronic abdominal pain Duodenal adenoma  History of Present Illness: Kimberly Mccormick is a 70 y.o. female with PMH breast cancer s/p , CHF, IBS, hyperlipidemia, hypertension, who presents for follow-up of chronic abdominal pain.  The patient was last seen on 07/13/2020. At that time, the patient was ordered a colonoscopy for a positive Cologuard.  She was given a prescription for Bentyl for abdominal pain and was advised to start taking MiraLAX on a frequent basis.  Colonoscopy showed findings described below.  She underwent a CT of the abdomen and pelvis on 08/17/2020 which showed nonobstructive nephrolithiasis but no other changes.  Due to persistent pain she underwent an EGD with findings described below.  Patient was advised to implement a low FODMAP diet and to use IBgard every 8 hours as needed for abdominal pain.  Patient states for the last couple of weeks she has felt "queezy abdomen" which she does describe as nausea and discomfort in her abdomen. She vomited a couple of days ago x1. She reports having pain when she eats eggs or milk but is not following any other type of diet -overall feels that her pain is not as frequent as it was in the past. She lost the list of her FODMAP regimen so is not following it compliantly. Has lost 5 lb since the last time she was in our office - has been taking Boost to improve her weight. Feels decreased appetite. Sometimes she feels very full after eating and she believes this is causing her symptomatology.  Notably she reports that she noticed that after taking omeprazole, her symptoms subside significantly.  She has only been  taking this intermittently and not on a constant basis.  The patient denies having any nausea, vomiting, fever, chills, hematochezia, melena, hematemesis, abdominal distention, diarrhea, jaundice, pruritus.   Patient reported she has been very nervous recently.  She is not taking any medication for anxiety at the moment.  Last EGD:08/29/2020, nonobstructive Schatzki's ring at the GE junction, there was a 2 cm hiatal hernia, normal stomach, and 15 mm flat polyp was found in the duodenal bulb.  This was injected for endoscopic mucosal resection which was removed in a piecemeal fashion with) jumbo forceps without bleeding after resection.  Pathology was consistent with a duodenal adenoma.  Random biopsies of the small bowel were negative for any pathology.  Patient was advised to repeat EGD in 1 year. Last Colonoscopy:  07/2020 Two 4 to 10 mm polyps in the ascending colon, removed with a cold snare. Resected and retrieved.  Pathology consistent with tubular adenoma.  Advised to repeat in 7 years. - Non-bleeding internal hemorrhoids.  Past Medical History: Past Medical History:  Diagnosis Date   Anemia    iron def   Breast cancer ([place])    left, Status post chemotherapy and XRT   CHF (congestive heart failure) (HCC)    DDD (degenerative disc disease), lumbar    Essential hypertension    Essential tremor    Genital warts    GERD (gastroesophageal reflux disease)    History of anemia    History of renal insufficiency    Hyperlipidemia    Hypertension    Insomnia  Mixed hyperlipidemia    Osteoarthritis    Otitis media    right   Peripheral neuropathy    d/t chemo   Sinus infection 01/04/14    Past Surgical History: Past Surgical History:  Procedure Laterality Date   APPENDECTOMY  1966   BIOPSY  08/29/2020   Procedure: BIOPSY;  Surgeon: Harvel Quale, MD;  [facility];  Service: Gastroenterology;;   BREAST LUMPECTOMY WITH RADIOACTIVE SEED AND SENTINEL LYMPH  NODE BIOPSY Left 05/06/2018   Procedure: LEFT BREAST LUMPECTOMY WITH RADIOACTIVE SEED AND LEFT DEEP AXILLARY SENTINEL LYMPH NODE BIOPSY AND BLUE DYE INJECTION;  Surgeon: Fanny Skates, MD;  Location: [place];  Service: General;  Laterality: Left;   CATARACT EXTRACTION W/PHACO Left 01/10/2014   Procedure: CATARACT EXTRACTION PHACO AND INTRAOCULAR LENS PLACEMENT ; CDE:  4.94;  Surgeon: Williams Che, MD;  Location: [facility];  Service: Ophthalmology;  Laterality: Left;   CESAREAN SECTION  1986   CHOLECYSTECTOMY     COLONOSCOPY WITH PROPOFOL N/A 07/25/2020   Procedure: COLONOSCOPY WITH PROPOFOL;  Surgeon: Harvel Quale, MD;  [facility];  Service: Gastroenterology;  Laterality: N/A;  10:55   ESOPHAGOGASTRODUODENOSCOPY (EGD) WITH PROPOFOL N/A 08/29/2020   Procedure: ESOPHAGOGASTRODUODENOSCOPY (EGD) WITH PROPOFOL;  Surgeon: Harvel Quale, MD;  [facility];  Service: Gastroenterology;  Laterality: N/A;  12:00   MYRINGOTOMY WITH TUBE PLACEMENT Right 02/04/2017   Procedure: REVISION OF RIGHT MYRINGOTOMY WITH TUBE PLACEMENT, WITH EXAM OF LEFT EAR;  Surgeon: Leta Baptist, MD;  Location: [place];  Service: ENT;  Laterality: Right;   NASAL SINUS SURGERY  2016   polypectomy   POLYPECTOMY  07/25/2020   Procedure: POLYPECTOMY;  Surgeon: Harvel Quale, MD;  [facility];  Service: Gastroenterology;;   POLYPECTOMY  08/29/2020   Procedure: POLYPECTOMY;  Surgeon: Harvel Quale, MD;  [facility];  Service: Gastroenterology;;   [facility] REMOVAL N/A 01/06/2019   Procedure: REMOVAL PORT-A-CATH;  Surgeon: Fanny Skates, MD;  Location: [place];  Service: General;  Laterality: N/A;   PORTACATH PLACEMENT Right 06/16/2018   Procedure: INSERTION PORT-A-CATH;  Surgeon: Fanny Skates, MD;  Location: [place];  Service: General;  Laterality: Right;   SUBMUCOSAL LIFTING  INJECTION  08/29/2020   Procedure: SUBMUCOSAL LIFTING INJECTION;  Surgeon: Harvel Quale, MD;  [facility];  Service: Gastroenterology;;   TONSILLECTOMY  1970    Family History: Family History  Problem Relation Age of Onset   Lupus Mother    Heart attack Father        age 53   Prostate cancer Father    Hypothyroidism Sister    Breast cancer Sister    Hypertension Sister    Hypertension Brother     Social History: Social History   Tobacco Use  Smoking Status Never  Smokeless Tobacco Never   Social History   Substance and Sexual Activity  Alcohol Use No   Social History   Substance and Sexual Activity  Drug Use No    Allergies: Allergies  Allergen Reactions   Eggs Or Egg-Derived Products Nausea And Vomiting   Other Nausea And Vomiting    Dairy products\    Penicillins Hives    Has patient had a PCN reaction causing immediate rash, facial/tongue/throat swelling, SOB or lightheadedness with hypotension: No Has patient had a PCN reaction causing severe rash involving mucus membranes or skin necrosis: No Has patient had  a PCN reaction that required hospitalization: No Has patient had a PCN reaction occurring within the last 10 years: No If all of the above answers are "NO", then may proceed with Cephalosporin use.     Medications: Current Outpatient Medications  Medication Sig Dispense Refill   acyclovir (ZOVIRAX) 400 MG tablet Take 400 mg by mouth 2 (two) times daily.      ALPRAZolam (XANAX) 0.5 MG tablet Take 0.5 mg by mouth every 8 (eight) hours as needed for anxiety.     benzonatate (TESSALON) 100 MG capsule Take by mouth 3 (three) times daily as needed for cough.     cetirizine (ZYRTEC) 10 MG tablet Take 10 mg by mouth daily.     Cholecalciferol (VITAMIN D) 2000 UNITS tablet Take 2,000 Units by mouth daily.     Cyanocobalamin 5000 MCG TBDP Take 5,000 mcg by mouth daily.     ferrous sulfate 325 (65 FE) MG tablet Take 325 mg by mouth  daily with breakfast.     hyoscyamine (LEVSIN SL) 0.125 MG SL tablet Place 1 tablet (0.125 mg total) under the tongue every 6 (six) hours as needed (abdominal pain). 30 tablet 3   metoprolol succinate (TOPROL-XL) 25 MG 24 hr tablet Take 25 mg by mouth daily.  3   omeprazole (PRILOSEC) 40 MG capsule Take 1 capsule (40 mg total) by mouth 2 (two) times daily. 60 capsule 0   pramipexole (MIRAPEX) 0.125 MG tablet Take 3 tablets (0.375 mg total) by mouth 3 (three) times daily. 270 tablet 1   zolpidem (AMBIEN) 10 MG tablet Take 5 mg by mouth at bedtime as needed for sleep.     No current facility-administered medications for this visit.   Facility-Administered Medications Ordered in Other Visits  Medication Dose Route Frequency Provider Last Rate Last Admin   sodium chloride flush (NS) 0.9 % injection 10 mL  10 mL Intracatheter PRN Nicholas Lose, MD   10 mL at 08/04/18 1518    Review of Systems: GENERAL: negative for malaise, night sweats HEENT: No changes in hearing or vision, no nose bleeds or other nasal problems. NECK: Negative for lumps, goiter, pain and significant neck swelling RESPIRATORY: Negative for cough, wheezing CARDIOVASCULAR: Negative for chest pain, leg swelling, palpitations, orthopnea GI: SEE HPI MUSCULOSKELETAL: Negative for joint pain or swelling, back pain, and muscle pain. SKIN: Negative for lesions, rash PSYCH: Negative for sleep disturbance, mood disorder and recent psychosocial stressors. HEMATOLOGY Negative for prolonged bleeding, bruising easily, and swollen nodes. ENDOCRINE: Negative for cold or heat intolerance, polyuria, polydipsia and goiter. NEURO: negative for tremor, gait imbalance, syncope and seizures. The remainder of the review of systems is noncontributory.   Physical Exam: BP (!) 147/95 (BP Location: Left Arm, Patient Position: Sitting, Cuff Size: Small)   Pulse 65   Temp 98.1 F (36.7 C) (Oral)   Ht 5\' 3"  (1.6 m)   Wt 132 lb 12.8 oz (60.2 kg)    BMI 23.52 kg/m  GENERAL: The patient is AO x3, in no acute distress. HEENT: Head is normocephalic and atraumatic. EOMI are intact. Mouth is well hydrated and without lesions. NECK: Supple. No masses LUNGS: Clear to auscultation. No presence of rhonchi/wheezing/rales. Adequate chest expansion HEART: RRR, normal s1 and s2. ABDOMEN: Soft, nontender, no guarding, no peritoneal signs, and nondistended. BS +. No masses. EXTREMITIES: Without any cyanosis, clubbing, rash, lesions or edema. NEUROLOGIC: AOx3, no focal motor deficit. SKIN: no jaundice, no rashes  Imaging/Labs: as above  I personally reviewed and interpreted  the available labs, imaging and endoscopic files.  Impression and Plan: Kimberly Mccormick is a 70 y.o. female with PMH breast cancer s/p , CHF, IBS, hyperlipidemia, hypertension, who presents for follow-up of chronic abdominal pain.  The patient has presented recurrent episodes of abdominal pain in the past which have been chronic.  For this she has undergone endoscopic and imaging investigations that have been negative for an organic etiology of her symptoms.  She was found to have a duodenal adenoma that was removed in a piecemeal fashion but no other alteration.  She has had significant stress recently which along with her negative investigations may support a diagnosis of irritable bowel syndrome.  I had a lengthy discussion with the patient regarding this.  She has presented some improvement with use of PPI intermittently.  I advised the patient to start taking the omeprazole every day.  We will attempt this treatment prior to considering management with low-dose SSRI.  She will also benefit from implementing compliantly her low FODMAP diet.  Given her weight loss and early satiety, we will also investigate for possible gastroparesis with a gastric emptying study.  Finally, she will have to repeat a esophagogastroduodenospy in August 2023 for surveillance of her duodenal adenoma.  -  Schedule gastric emptying study - Start omeprazole 40 mg every day - Instruction provided in the use of antireflux medication - patient should take medication in the morning 30-45 minutes before eating breakfast.  - Restart low FODMAP diet -Repeat EGD in August 2023  All questions were answered.      Harvel Quale, MD Gastroenterology and Hepatology [facility]

## 2020-11-29 DIAGNOSIS — Z86010 Personal history of colonic polyps: Secondary | ICD-10-CM | POA: Diagnosis not present

## 2020-11-29 DIAGNOSIS — Z08 Encounter for follow-up examination after completed treatment for malignant neoplasm: Secondary | ICD-10-CM | POA: Diagnosis not present

## 2020-11-29 DIAGNOSIS — Z170 Estrogen receptor positive status [ER+]: Secondary | ICD-10-CM | POA: Diagnosis not present

## 2020-11-29 DIAGNOSIS — Z923 Personal history of irradiation: Secondary | ICD-10-CM | POA: Diagnosis not present

## 2020-11-29 DIAGNOSIS — C50212 Malignant neoplasm of upper-inner quadrant of left female breast: Secondary | ICD-10-CM | POA: Diagnosis not present

## 2020-11-29 DIAGNOSIS — Z9221 Personal history of antineoplastic chemotherapy: Secondary | ICD-10-CM | POA: Diagnosis not present

## 2020-11-29 DIAGNOSIS — R5383 Other fatigue: Secondary | ICD-10-CM | POA: Diagnosis not present

## 2020-11-29 DIAGNOSIS — Z853 Personal history of malignant neoplasm of breast: Secondary | ICD-10-CM | POA: Diagnosis not present

## 2020-11-30 ENCOUNTER — Encounter: Payer: Self-pay | Admitting: Hematology and Oncology

## 2020-12-05 ENCOUNTER — Encounter (HOSPITAL_COMMUNITY)
Admission: RE | Admit: 2020-12-05 | Discharge: 2020-12-05 | Disposition: A | Discharge status: Home / Self Care | Payer: Medicare Other | Source: Ambulatory Visit | Attending: Gastroenterology | Admitting: Gastroenterology

## 2020-12-05 ENCOUNTER — Encounter (HOSPITAL_COMMUNITY): Payer: Self-pay

## 2020-12-05 ENCOUNTER — Other Ambulatory Visit: Payer: Self-pay

## 2020-12-05 DIAGNOSIS — R6881 Early satiety: Secondary | ICD-10-CM | POA: Insufficient documentation

## 2020-12-05 MED ORDER — TECHNETIUM TC 99M SULFUR COLLOID
2.0000 | Freq: Once | INTRAVENOUS | Status: AC | PRN
  Administered 2020-12-05: 1.92 via ORAL

## 2020-12-07 ENCOUNTER — Encounter: Payer: Self-pay | Admitting: Hematology and Oncology

## 2020-12-07 ENCOUNTER — Other Ambulatory Visit: Payer: Self-pay

## 2020-12-07 ENCOUNTER — Ambulatory Visit (INDEPENDENT_AMBULATORY_CARE_PROVIDER_SITE_OTHER): Payer: Medicare Other | Admitting: Neurology

## 2020-12-07 ENCOUNTER — Encounter: Payer: Self-pay | Admitting: Neurology

## 2020-12-07 VITALS — BP 156/92 | HR 63 | Ht 63.0 in | Wt 133.0 lb

## 2020-12-07 DIAGNOSIS — G20 Parkinson's disease: Secondary | ICD-10-CM

## 2020-12-07 DIAGNOSIS — R498 Other voice and resonance disorders: Secondary | ICD-10-CM

## 2020-12-07 MED ORDER — PRAMIPEXOLE DIHYDROCHLORIDE 0.5 MG PO TABS: 0.5000 mg | ORAL_TABLET | Freq: Three times a day (TID) | ORAL | 3 refills | Status: DC

## 2020-12-07 NOTE — Patient Instructions (Signed)
It was nice to see you again today.  I am glad to hear that you are able to tolerate the pramipexole and have noticed some benefit from it.  As discussed, I would like for [place] to increase the dose to 0.5 mg, take 1 pill 3 times daily.  We will change the prescription and send the new prescription to your pharmacy.  Once you fill the new prescription, you will just take 0.5 mg strength 1 pill 3 times daily.  Continue to pursue healthy and try to exercise on a regular basis, going to the gym 3 times a week is great.  We can consider speech therapy down the road for low volume and normal voice.  We can always consider referring you to physical and Occupational Therapy as well close to home. Monitor for constipation issues.  Try to drink more water.  Please follow-up in 6 months, sooner if needed.

## 2020-12-07 NOTE — Progress Notes (Signed)
Subjective:    Patient ID: Kimberly Mccormick is a 70 y.o. female.  HPI    Interim history:   Kimberly Mccormick is a 70 year old right-handed woman with an underlying medical history of hypertension, hyperlipidemia, reflux disease, anemia, degenerative lumbar disc disease, breast cancer with status post lumpectomy in 2020, chemotherapy, XRT, history of neuropathy, and insomnia, who presents for follow up consultation of her parkinsonism. The patient is unaccompanied today. I last saw her on 03/01/20, at which time we talked about her recent [place].  She was encouraged to start a dopamine agonist and we mutually agreed to start her on pramipexole low-dose with gradual titration.   Today, 12/07/2020: She reports doing fairly well.  She reports that the medication has helped, she can tolerated, no side effects to speak of, in particular, no significant nausea or vomiting or sedation from it.  She does report a residual tremor and it flares up when she gets upset has been on this test.  She denies any significant constipation, she does have a low volume voice and family often has to ask her to repeat herself.  She has not had any falls thankfully.  She tries to exercise and tries to go to the line about 3 days out of the week, she has not gone regularly.  She is supposed to go to the Parkinson's disease support group or classes soon with her cousin who also has Parkinson's disease.  She has no cognitive concerns.  She has a cough intermittently.  She denies any swallowing difficulty or significant drooling.  Has occasional neck discomfort.  She may have pulled a muscle in her left mid back area or side this morning.  She took some Tylenol.  The patient's allergies, current medications, family history, past medical history, past social history, past surgical history and problem list were reviewed and updated as appropriate.    Previously:      I first met her on 12/20/2019 at the request of her primary care  physician, at which time she reported a several year history of intermittent left hand tremors.  Examination showed evidence of mild left-sided parkinsonism.  We talked about potentially utilizing symptomatic treatments in the near future and she was encouraged to pursue a DaTscan.  She was agreeable.   She had an interim DaTscan on 02/23/2020 and I reviewed the results: IMPRESSION: Significant decreased striatal Ioflupane activity most prominent in the posterior striatum. This pattern can be seen in Parkinsonian syndromes.   Of note, DaTSCAN is not diagnostic of Parkinsonian syndromes, which remains a clinical diagnosis. DaTscan is an adjuvant test to aid in the clinical diagnosis of Parkinsonian syndromes.   We called her with her test results and arrange for a follow-up appointment.   12/20/19: (She) reports a 2 to 54-monthhistory of intermittent left hand tremors. Per son, her tremor may have been ongoing for about 5 to 6 months as per his recollection. She notices the tremor while sitting at rest and also when writing with her right hand, her tremor exacerbates on the left side. She does not have much in the way of right hand tremor and is not particularly bothered by the tremor. She denies any balance issues or falls. Later on in our discussion she does admit to other symptoms including some posture changes with leaning over more than usual and she has noticed that her voice becomes weaker or softer as the day progresses. She has a paternal aunt who had Parkinson's disease. Both parents lived  to be 41 years old. She lives with her son, she has another son. She is separated. 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 She is a non-smoker and does not utilize alcohol and does not drink caffeine on a daily basis. She tries to hydrate well but could do better by self-report. She tries to exercise, uses a bike for exercise on a fairly regular basis. I  reviewed your office note from 10/06/2019.       Her Past Medical History Is Significant For: Past Medical History:  Diagnosis Date   Anemia    iron def   Breast cancer ([place])    left, Status post chemotherapy and XRT   CHF (congestive heart failure) (HCC)    DDD (degenerative disc disease), lumbar    Essential hypertension    Essential tremor    Genital warts    GERD (gastroesophageal reflux disease)    History of anemia    History of renal insufficiency    Hyperlipidemia    Hypertension    Insomnia    Mixed hyperlipidemia    Osteoarthritis    Otitis media    right   Peripheral neuropathy    d/t chemo   Sinus infection 01/04/14    Her Past Surgical History Is Significant For: Past Surgical History:  Procedure Laterality Date   APPENDECTOMY  1966   BIOPSY  08/29/2020   Procedure: BIOPSY;  Surgeon: Harvel Quale, MD;  [facility];  Service: Gastroenterology;;   BREAST LUMPECTOMY WITH RADIOACTIVE SEED AND SENTINEL LYMPH NODE BIOPSY Left 05/06/2018   Procedure: LEFT BREAST LUMPECTOMY WITH RADIOACTIVE SEED AND LEFT DEEP AXILLARY SENTINEL LYMPH NODE BIOPSY AND BLUE DYE INJECTION;  Surgeon: Fanny Skates, MD;  Location: [place];  Service: General;  Laterality: Left;   CATARACT EXTRACTION W/PHACO Left 01/10/2014   Procedure: CATARACT EXTRACTION PHACO AND INTRAOCULAR LENS PLACEMENT ; CDE:  4.94;  Surgeon: Williams Che, MD;  Location: [facility];  Service: Ophthalmology;  Laterality: Left;   CESAREAN SECTION  1986   CHOLECYSTECTOMY     COLONOSCOPY WITH PROPOFOL N/A 07/25/2020   Procedure: COLONOSCOPY WITH PROPOFOL;  Surgeon: Harvel Quale, MD;  [facility];  Service: Gastroenterology;  Laterality: N/A;  10:55   ESOPHAGOGASTRODUODENOSCOPY (EGD) WITH PROPOFOL N/A 08/29/2020   Procedure: ESOPHAGOGASTRODUODENOSCOPY (EGD) WITH PROPOFOL;  Surgeon: Harvel Quale, MD;  [facility];  Service:  Gastroenterology;  Laterality: N/A;  12:00   MYRINGOTOMY WITH TUBE PLACEMENT Right 02/04/2017   Procedure: REVISION OF RIGHT MYRINGOTOMY WITH TUBE PLACEMENT, WITH EXAM OF LEFT EAR;  Surgeon: Leta Baptist, MD;  Location: [facility];  Service: ENT;  Laterality: Right;   NASAL SINUS SURGERY  2016   polypectomy   POLYPECTOMY  07/25/2020   Procedure: POLYPECTOMY;  Surgeon: Harvel Quale, MD;  [facility];  Service: Gastroenterology;;   POLYPECTOMY  08/29/2020   Procedure: POLYPECTOMY;  Surgeon: Harvel Quale, MD;  [facility];  Service: Gastroenterology;;   [facility] REMOVAL N/A 01/06/2019   Procedure: REMOVAL PORT-A-CATH;  Surgeon: Fanny Skates, MD;  Location: [place];  Service: General;  Laterality: N/A;   PORTACATH PLACEMENT Right 06/16/2018   Procedure: INSERTION PORT-A-CATH;  Surgeon: Fanny Skates, MD;  Location: [place];  Service: General;  Laterality: Right;   SUBMUCOSAL LIFTING INJECTION  08/29/2020   Procedure: SUBMUCOSAL LIFTING INJECTION;  Surgeon: Harvel Quale,  MD;  [facility];  Service: Gastroenterology;;   TONSILLECTOMY  1970    Her Family History Is Significant For: Family History  Problem Relation Age of Onset   Lupus Mother    Heart attack Father        age 81   Prostate cancer Father    Hypothyroidism Sister    Breast cancer Sister    Hypertension Sister    Hypertension Brother    Parkinson's disease Paternal Aunt     Her Social History Is Significant For: Social History   Socioeconomic History   Marital status: Legally Separated    Spouse name: Not on file   Number of children: 2   Years of education: Not on file   Highest education level: Not on file  Occupational History    Comment: retired  Tobacco Use   Smoking status: Never   Smokeless tobacco: Never  Vaping Use   Vaping Use: Never used  Substance and Sexual Activity   Alcohol  use: No   Drug use: No   Sexual activity: Not Currently  Other Topics Concern   Not on file  Social History Narrative   One son lives with her   Social Determinants of Health   Financial Resource Strain: Not on file  Food Insecurity: Not on file  Transportation Needs: Not on file  Physical Activity: Not on file  Stress: Not on file  Social Connections: Not on file    Her Allergies Are:  Allergies  Allergen Reactions   Eggs Or Egg-Derived Products Nausea And Vomiting   Other Nausea And Vomiting    Dairy products\    Penicillins Hives    Has patient had a PCN reaction causing immediate rash, facial/tongue/throat swelling, SOB or lightheadedness with hypotension: No Has patient had a PCN reaction causing severe rash involving mucus membranes or skin necrosis: No Has patient had a PCN reaction that required hospitalization: No Has patient had a PCN reaction occurring within the last 10 years: No If all of the above answers are "NO", then may proceed with Cephalosporin use.   :   Her Current Medications Are:  Outpatient Encounter Medications as of 12/07/2020  Medication Sig   acyclovir (ZOVIRAX) 400 MG tablet Take 400 mg by mouth 2 (two) times daily.    ALPRAZolam (XANAX) 0.5 MG tablet Take 0.5 mg by mouth every 8 (eight) hours as needed for anxiety.   benzonatate (TESSALON) 100 MG capsule Take by mouth 3 (three) times daily as needed for cough.   cetirizine (ZYRTEC) 10 MG tablet Take 10 mg by mouth daily.   Cholecalciferol (VITAMIN D) 2000 UNITS tablet Take 2,000 Units by mouth daily.   Cyanocobalamin 5000 MCG TBDP Take 5,000 mcg by mouth daily.   ferrous sulfate 325 (65 FE) MG tablet Take 325 mg by mouth daily with breakfast.   hyoscyamine (LEVSIN SL) 0.125 MG SL tablet Place 1 tablet (0.125 mg total) under the tongue every 6 (six) hours as needed (abdominal pain).   metoprolol succinate (TOPROL-XL) 25 MG 24 hr tablet Take 25 mg by mouth daily.   omeprazole (PRILOSEC) 40 MG  capsule Take 1 capsule (40 mg total) by mouth daily.   pramipexole (MIRAPEX) 0.125 MG tablet Take 3 tablets (0.375 mg total) by mouth 3 (three) times daily.   zolpidem (AMBIEN) 10 MG tablet Take 5 mg by mouth at bedtime as needed for sleep.   Facility-Administered Encounter Medications as of 12/07/2020  Medication   sodium chloride flush (  NS) 0.9 % injection 10 mL  :  Review of Systems:  Out of a complete 14 point review of systems, all are reviewed and negative with the exception of these symptoms as listed below:  Review of Systems  Neurological:        Pt is here for Parkinson's follow up. Pt states when she is upset she has increased tremors . Pt states she is doing fine and has no concerns or questions for today's visit    Objective:  Neurological Exam  Physical Exam Physical Examination:   Vitals:   12/07/20 0937  BP: (!) 156/92  Pulse: 63    General Examination: The patient is a very pleasant 70 y.o. female in no acute distress. She appears well-developed and well-nourished and well groomed.   HEENT: Normocephalic, atraumatic, pupils are equal, round and reactive to light. Status post bilateral cataract repairs. Extraocular tracking is fairly well preserved, hearing is mildly impaired. She does not have hearing aids. Airway examination reveals mild mouth dryness, dentures in place, tongue protrudes centrally and palate elevates symmetrically. She has no lip, neck or jaw tremor, mild nuchal rigidity is noted, mild to moderate facial masking is noted. Normal facial sensation to light touch. She has mild hypophonia, no dysarthria, no voice tremor noted.   Chest: Clear to auscultation without wheezing, rhonchi or crackles noted.   Heart: S1+S2+0, regular and normal without murmurs, rubs or gallops noted.    Abdomen: Soft, non-tender and non-distended, normal bowel sounds.   Extremities: There is no pitting edema in the distal lower extremities bilaterally.   Skin: Warm and  dry without trophic changes noted.   Musculoskeletal: exam reveals no obvious joint deformities, mild right knee discomfort.    Neurologically:  Mental status: The patient is awake, alert and oriented in all 4 spheres. Her immediate and remote memory, attention, language skills and fund of knowledge are appropriate. There is no evidence of aphasia, agnosia, apraxia or anomia. Speech is clear with normal prosody and enunciation. Thought process is linear. Mood is normal and affect is normal.  Cranial nerves II - XII are as described above under HEENT exam. In addition: shoulder shrug is normal with equal shoulder height noted. Motor exam: Normal bulk, global strength of 5 out of 5, she has a intermittent mild resting tremor in the left upper extremity only, no other resting tremor noted.  (On 12/20/2019: On Archimedes spiral drawing she has known difficulty with either hand, handwriting with her right hand, which is her dominant hand is legible, not tremulous, not particularly micrographic.)  She has a minimal postural tremor in the left upper extremity, no significant action tremor in either hand, no intention tremor. She has no lower extremity tremors. Romberg is negative. Reflexes are 1+ in the upper extremities and trace in the knees, absent in the ankles. Babinski: Toes are flexor bilaterally. Fine motor skills and coordination: She has mild to moderate difficulty with finger taps on the left side, minimal difficulty or fairly normal in the right upper extremity. Foot taps and foot agility are fairly normal on the right, mildly impaired on the left. Hand movements and rapid alternating patting are slightly difficult with the left only. She has overall mild bradykinesia.   Cerebellar testing: No dysmetria or intention tremor on finger to nose testing. Heel to shin is unremarkable bilaterally. There is no truncal or gait ataxia.  Sensory exam: intact to light touch in the upper and lower extremities.   Gait, station and balance: She  stands without difficulty, posture is probably slightly stooped for age, stable. She walks with decreased arm swing on the left, slightly slow in her walking speed with no shuffling noted, no festination, no freezing, stable.   Assessment and Plan:    In summary, Lynnex Fulp Pearse is a very pleasant 70 year old female with an underlying medical history of hypertension, hyperlipidemia, reflux disease, anemia, degenerative lumbar disc disease, breast cancer with status post lumpectomy in 2020, chemotherapy, XRT, history of neuropathy, and insomnia, who presents for follow-up consultation of her parkinsonism.  Her history, exam and recent DaTscan results are supportive of left-sided predominant Parkinson's disease. She has been on pramipexole which we started in February 2022.  She is up to 0.375 mg 3 times daily.  She is tolerating it and has noticed some benefit from it.  She has residual tremors which are notable to her.  She is overall doing fairly well.  I would like to increase her pramipexole just a little bit more to 0.5 mg 3 times daily at this time.  I would like to change her prescription so when she only has to take 1 pill 3 times daily of the 0.5 mg strength.   We talked about the importance of maintaining a healthy lifestyle.  She is advised to try to exercise regularly and try to hydrate a little better with water.  She is encouraged to monitor for constipation.  We can consider a speech therapy referral down the road but we mutually agreed to hold off on any therapy referral at this time.  She is agreeable to increasing the pramipexole to 0.5 mg 3 times daily.  She is advised to follow-up in this clinic in 6 months routinely, sooner if needed. I answered all her questions today and she was in agreement.  I spent 30 minutes in total face-to-face time and in reviewing records during pre-charting, more than 50% of which was spent in counseling and coordination of care,  reviewing test results, reviewing medications and treatment regimen and/or in discussing or reviewing the diagnosis of PD, the prognosis and treatment options. Pertinent laboratory and imaging test results that were available during this visit with the patient were reviewed by me and considered in my medical decision making (see chart for details).

## 2020-12-21 ENCOUNTER — Ambulatory Visit (INDEPENDENT_AMBULATORY_CARE_PROVIDER_SITE_OTHER): Payer: Medicare Other | Admitting: Gastroenterology

## 2021-01-02 ENCOUNTER — Telehealth: Payer: Self-pay | Admitting: Neurology

## 2021-01-02 NOTE — Telephone Encounter (Signed)
Pt called states she can't walk as good as she used to when she came to see [place] lasy Pt requesting a call back.

## 2021-01-02 NOTE — Telephone Encounter (Signed)
I called and LMVM for pt that returned call.

## 2021-01-03 ENCOUNTER — Encounter (HOSPITAL_COMMUNITY): Payer: Self-pay | Admitting: *Deleted

## 2021-01-03 ENCOUNTER — Inpatient Hospital Stay (HOSPITAL_COMMUNITY)
Admission: EM | Admit: 2021-01-03 | Discharge: 2021-01-05 | DRG: 066 | Disposition: A | Discharge status: Home / Self Care | Payer: Medicare Other | Attending: Internal Medicine | Admitting: Internal Medicine

## 2021-01-03 ENCOUNTER — Other Ambulatory Visit: Payer: Self-pay

## 2021-01-03 ENCOUNTER — Emergency Department (HOSPITAL_COMMUNITY): Payer: Medicare Other

## 2021-01-03 DIAGNOSIS — D509 Iron deficiency anemia, unspecified: Secondary | ICD-10-CM | POA: Diagnosis not present

## 2021-01-03 DIAGNOSIS — I671 Cerebral aneurysm, nonruptured: Secondary | ICD-10-CM | POA: Diagnosis not present

## 2021-01-03 DIAGNOSIS — Z880 Allergy status to penicillin: Secondary | ICD-10-CM

## 2021-01-03 DIAGNOSIS — E876 Hypokalemia: Secondary | ICD-10-CM

## 2021-01-03 DIAGNOSIS — G8314 Monoplegia of lower limb affecting left nondominant side: Secondary | ICD-10-CM | POA: Diagnosis present

## 2021-01-03 DIAGNOSIS — Z8673 Personal history of transient ischemic attack (TIA), and cerebral infarction without residual deficits: Secondary | ICD-10-CM | POA: Diagnosis not present

## 2021-01-03 DIAGNOSIS — Z79899 Other long term (current) drug therapy: Secondary | ICD-10-CM | POA: Diagnosis not present

## 2021-01-03 DIAGNOSIS — Z923 Personal history of irradiation: Secondary | ICD-10-CM

## 2021-01-03 DIAGNOSIS — I10 Essential (primary) hypertension: Secondary | ICD-10-CM | POA: Diagnosis present

## 2021-01-03 DIAGNOSIS — Z170 Estrogen receptor positive status [ER+]: Secondary | ICD-10-CM

## 2021-01-03 DIAGNOSIS — I720 Aneurysm of carotid artery: Secondary | ICD-10-CM | POA: Diagnosis not present

## 2021-01-03 DIAGNOSIS — K219 Gastro-esophageal reflux disease without esophagitis: Secondary | ICD-10-CM | POA: Diagnosis not present

## 2021-01-03 DIAGNOSIS — M7918 Myalgia, other site: Secondary | ICD-10-CM

## 2021-01-03 DIAGNOSIS — G40909 Epilepsy, unspecified, not intractable, without status epilepticus: Secondary | ICD-10-CM | POA: Diagnosis present

## 2021-01-03 DIAGNOSIS — G629 Polyneuropathy, unspecified: Secondary | ICD-10-CM | POA: Diagnosis present

## 2021-01-03 DIAGNOSIS — C50212 Malignant neoplasm of upper-inner quadrant of left female breast: Secondary | ICD-10-CM | POA: Diagnosis not present

## 2021-01-03 DIAGNOSIS — I6389 Other cerebral infarction: Secondary | ICD-10-CM | POA: Diagnosis not present

## 2021-01-03 DIAGNOSIS — G20 Parkinson's disease: Secondary | ICD-10-CM | POA: Diagnosis present

## 2021-01-03 DIAGNOSIS — M1990 Unspecified osteoarthritis, unspecified site: Secondary | ICD-10-CM | POA: Diagnosis present

## 2021-01-03 DIAGNOSIS — G4700 Insomnia, unspecified: Secondary | ICD-10-CM | POA: Diagnosis not present

## 2021-01-03 DIAGNOSIS — E782 Mixed hyperlipidemia: Secondary | ICD-10-CM | POA: Diagnosis present

## 2021-01-03 DIAGNOSIS — Z9221 Personal history of antineoplastic chemotherapy: Secondary | ICD-10-CM

## 2021-01-03 DIAGNOSIS — Z803 Family history of malignant neoplasm of breast: Secondary | ICD-10-CM

## 2021-01-03 DIAGNOSIS — I639 Cerebral infarction, unspecified: Secondary | ICD-10-CM | POA: Diagnosis not present

## 2021-01-03 DIAGNOSIS — Z8249 Family history of ischemic heart disease and other diseases of the circulatory system: Secondary | ICD-10-CM | POA: Diagnosis not present

## 2021-01-03 DIAGNOSIS — Z9049 Acquired absence of other specified parts of digestive tract: Secondary | ICD-10-CM

## 2021-01-03 DIAGNOSIS — Z91012 Allergy to eggs: Secondary | ICD-10-CM

## 2021-01-03 DIAGNOSIS — Z888 Allergy status to other drugs, medicaments and biological substances status: Secondary | ICD-10-CM

## 2021-01-03 LAB — CBC
HCT: 38.8 % (ref 36.0–46.0)
Hemoglobin: 12.6 g/dL (ref 12.0–15.0)
MCH: 28 pg (ref 26.0–34.0)
MCHC: 32.5 g/dL (ref 30.0–36.0)
MCV: 86.2 fL (ref 80.0–100.0)
Platelets: 232 10*3/uL (ref 150–400)
RBC: 4.5 MIL/uL (ref 3.87–5.11)
RDW: 13.2 % (ref 11.5–15.5)
WBC: 5.7 10*3/uL (ref 4.0–10.5)
nRBC: 0 % (ref 0.0–0.2)

## 2021-01-03 LAB — BASIC METABOLIC PANEL
Anion gap: 8 (ref 5–15)
BUN: 11 mg/dL (ref 8–23)
CO2: 25 mmol/L (ref 22–32)
Calcium: 8.9 mg/dL (ref 8.9–10.3)
Chloride: 107 mmol/L (ref 98–111)
Creatinine, Ser: 0.79 mg/dL (ref 0.44–1.00)
GFR, Estimated: >60 mL/min (ref >60)
Glucose, Bld: 120 mg/dL — ABNORMAL HIGH (ref 70–99)
Potassium: 3.4 mmol/L — ABNORMAL LOW (ref 3.5–5.1)
Sodium: 140 mmol/L (ref 135–145)

## 2021-01-03 IMAGING — MR MR HEAD W/O CM
9 of 10 series · 39 of 48 positions shown · non-contrast
Comparison: No pertinent prior exam.

CLINICAL DATA: Ataxia or coordination problem; left lower extremity
weakness and dysequilibrium

EXAM:
MRI HEAD WITHOUT CONTRAST
MRA HEAD WITHOUT CONTRAST
TECHNIQUE: Multiplanar, multi-echo pulse sequences of the brain and surrounding
structures were acquired without intravenous contrast. Angiographic
images of the Circle of Willis were acquired using MRA technique
without intravenous contrast.

[Series 5: DWI · axial · 4.0mm · 0.88mm/px · z∈[-50,+89]mm · 5 of 36 slices shown (1 of 4)]
[im 1/36]
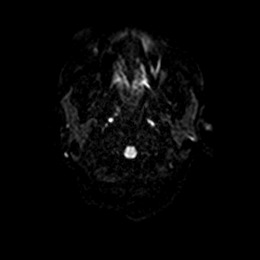
[im 9/36]
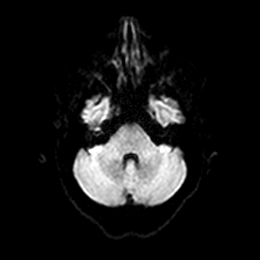
[im 18/36]
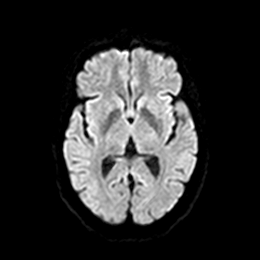
[im 27/36]
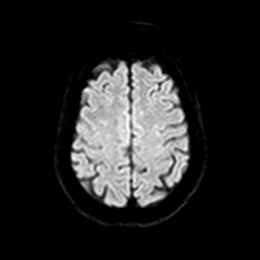
[im 36/36]
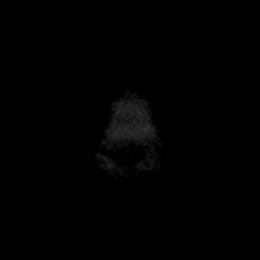

[Series 6: DWI · axial · 4.0mm · 0.88mm/px · z∈[-50,+89]mm · 5 of 36 slices shown (2 of 4)]
[im 1/36]
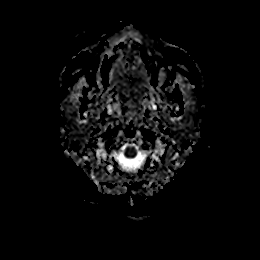
[im 9/36]
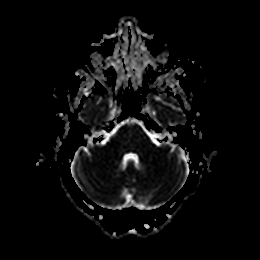
[im 18/36]
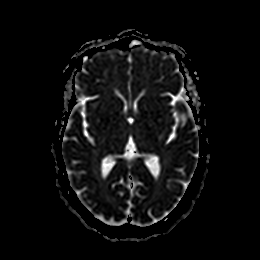
[im 27/36]
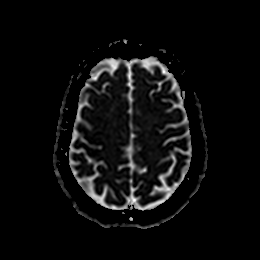
[im 36/36]
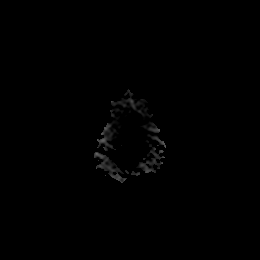

[Series 7: DWI · coronal · 4.0mm · 0.88mm/px · 5 of 32 slices shown (3 of 4)]
[im 1/32]
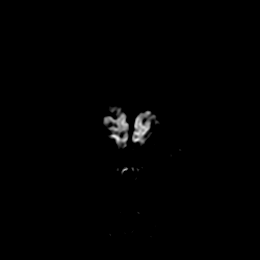
[im 8/32]
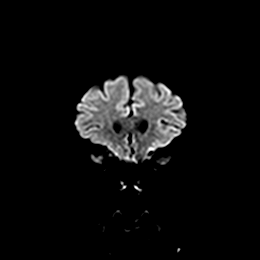
[im 16/32]
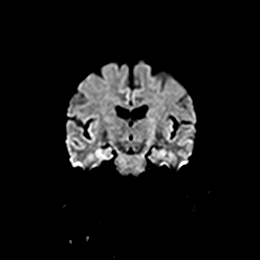
[im 24/32]
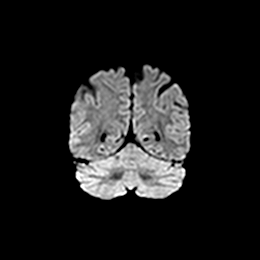
[im 32/32]
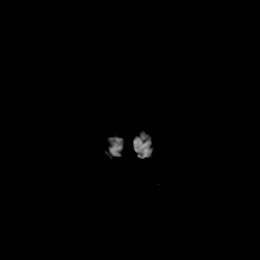

[Series 8: DWI · coronal · 4.0mm · 0.88mm/px · 5 of 32 slices shown (4 of 4)]
[im 1/32]
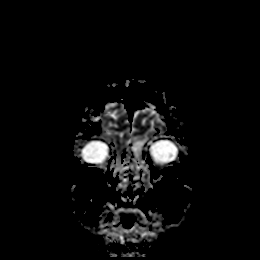
[im 8/32]
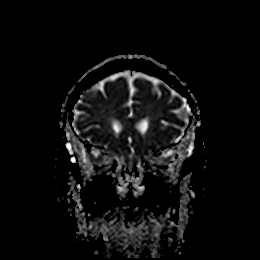
[im 16/32]
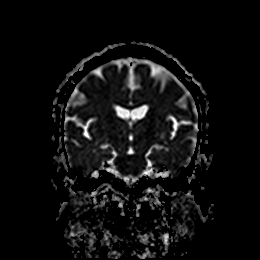
[im 24/32]
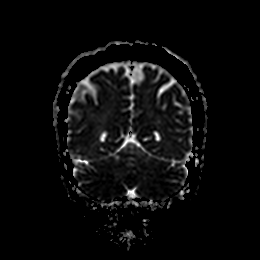
[im 32/32]
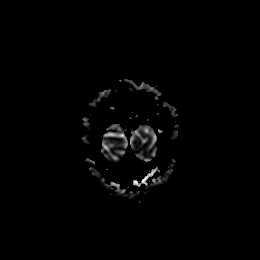

[Series 14: T1 · sagittal · 5.0mm · 0.80mm/px · 3 of 23 slices shown]
[im 1/23]
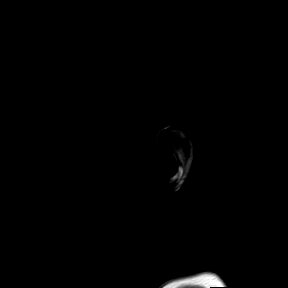
[im 12/23]
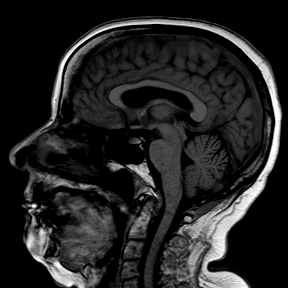
[im 23/23]
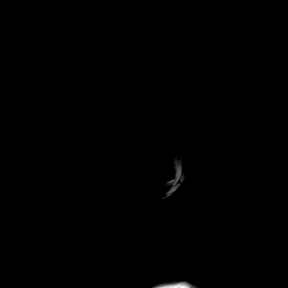

[Series 15: T2 · axial · 5.0mm · 0.72mm/px · z∈[-57,+96]mm · 3 of 23 slices shown (1 of 2)]
[im 1/23]
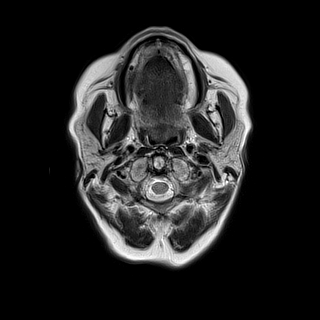
[im 12/23]
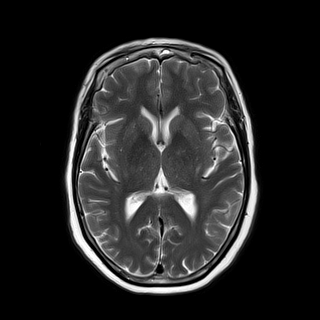
[im 23/23]
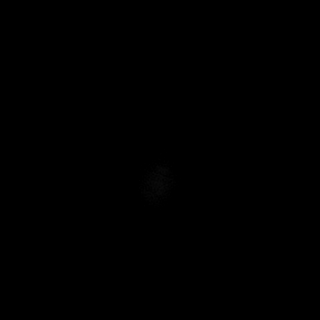

[Series 16: ax hemo · axial · 5.0mm · 0.86mm/px · z∈[-50,+93]mm · 4 of 25 slices shown]
[im 1/25]
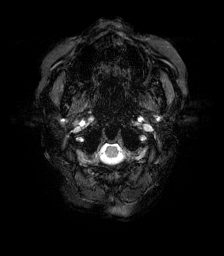
[im 9/25]
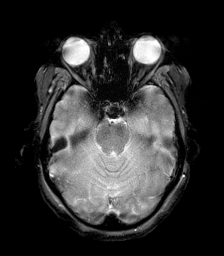
[im 17/25]
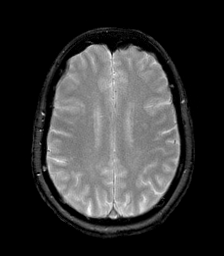
[im 25/25]
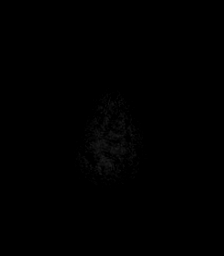

[Series 17: FLAIR · axial · 4.0mm · 0.43mm/px · z∈[-52,+95]mm · 5 of 38 slices shown]
[im 1/38]
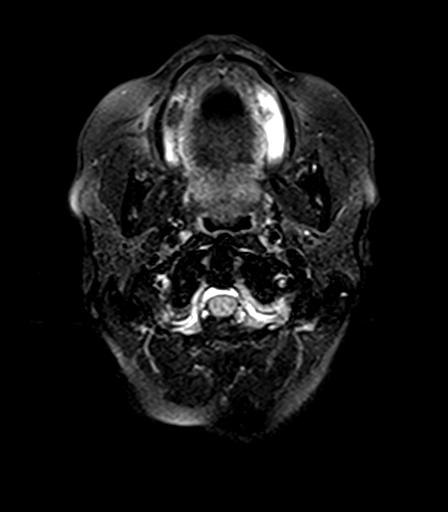
[im 10/38]
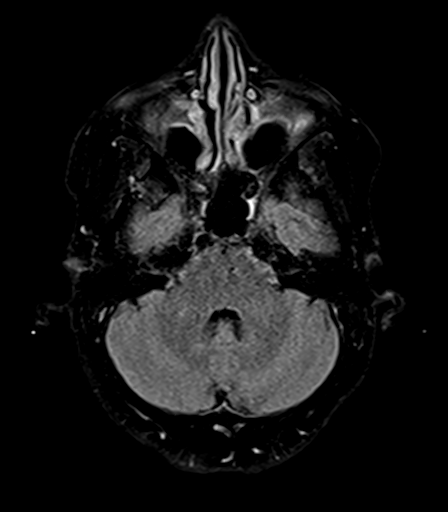
[im 19/38]
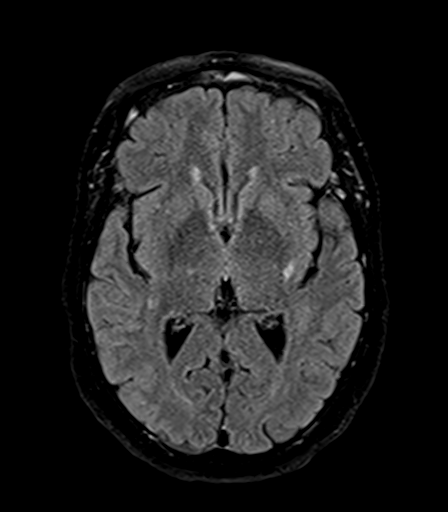
[im 28/38]
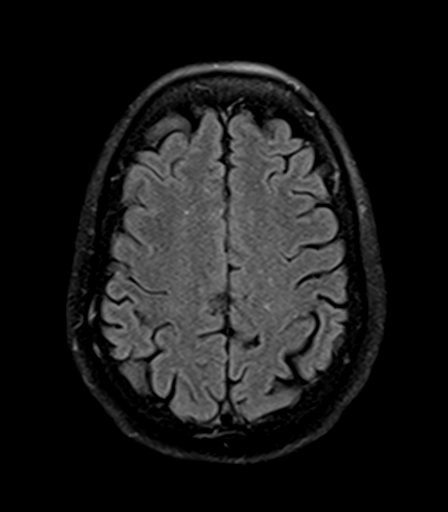
[im 38/38]
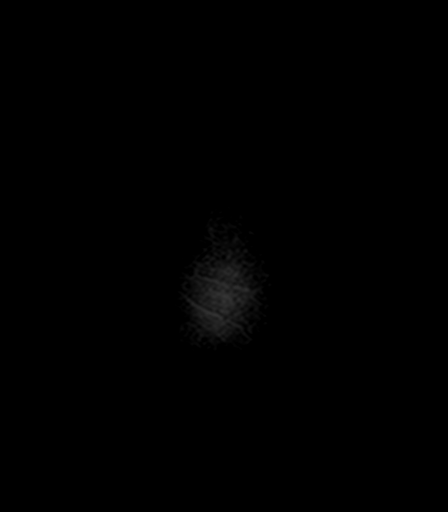

[Series 19: T2 · coronal · 5.0mm · 0.72mm/px · 4 of 28 slices shown (2 of 2)]
[im 1/28]
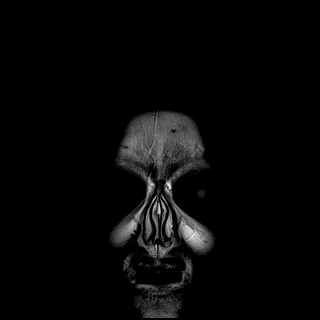
[im 10/28]
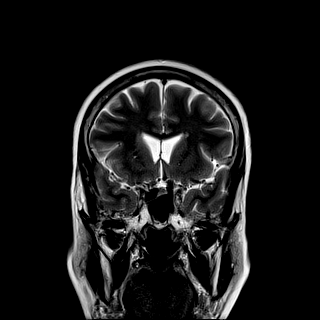
[im 19/28]
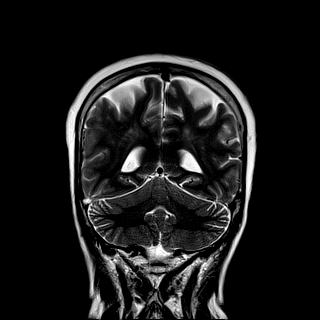
[im 28/28]
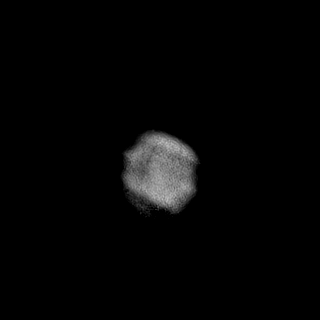

[39 of 48 positions shown; findings below may reference images not displayed]

FINDINGS: MRI HEAD FINDINGS

Brain: Small focus of reduced diffusion in the right corona radiata.

No evidence of intracranial hemorrhage. Patchy foci of T2
hyperintensity in the supratentorial white matter nonspecific but
probably reflect mild chronic microvascular ischemic changes. There
are chronic small vessel infarcts of the right caudate body and left
gangliocapsular region. No intracranial mass or mass effect. No
hydrocephalus or extra-axial collection

Vascular: Major vessel flow voids at the skull base are preserved.

Skull and upper cervical spine: Marrow signal is within normal
limits.

Sinuses/Orbits: Patchy mucosal thickening. Bilateral lens
replacements.

Other: Mastoid air cells are clear.  Sella is unremarkable.

MRA HEAD FINDINGS

Motion artifact is present.

Anterior circulation: Intracranial internal carotid arteries are
patent with atherosclerotic irregularity. There is a wide neck 4.7 x
3.5 mm medially directed aneurysm from the supraclinoid left ICA.
Anterior and middle cerebral arteries are patent.

Posterior circulation: Included intracranial vertebral arteries are
patent. Basilar artery is patent bilateral posterior communicating
arteries are present. Posterior cerebral arteries are patent.
IMPRESSION: Small acute infarct of the right corona radiata.

Mild chronic microvascular ischemic changes. Chronic small vessel
infarcts.

Motion degraded vascular imaging demonstrates no proximal occlusion.
There is a 5 mm aneurysm of the supraclinoid left ICA.

## 2021-01-03 IMAGING — MR MR MRA HEAD W/O CM
1 series · 48 of 48 positions shown · non-contrast
Comparison: No pertinent prior exam.

CLINICAL DATA: Ataxia or coordination problem; left lower extremity
weakness and dysequilibrium

EXAM:
MRI HEAD WITHOUT CONTRAST
MRA HEAD WITHOUT CONTRAST
TECHNIQUE: Multiplanar, multi-echo pulse sequences of the brain and surrounding
structures were acquired without intravenous contrast. Angiographic
images of the Circle of Willis were acquired using MRA technique
without intravenous contrast.

[Series 100: TOF · axial · 0.5mm · 0.76mm/px · z∈[-44,+34]mm · 48 of 168 slices shown]
[im 1/168]
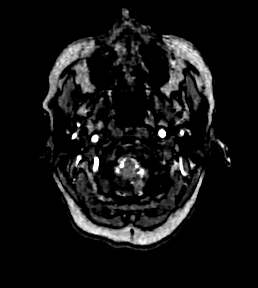
[im 4/168]
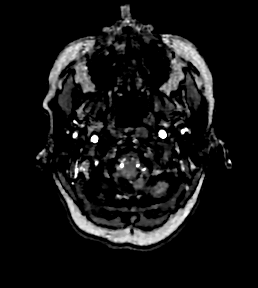
[im 8/168]
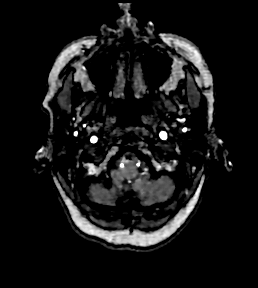
[im 11/168]
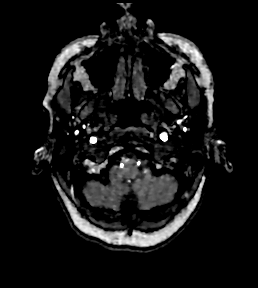
[im 15/168]
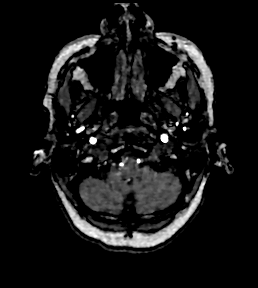
[im 18/168]
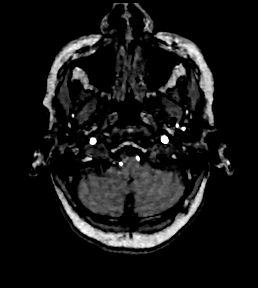
[im 22/168]
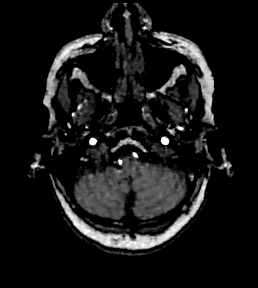
[im 25/168]
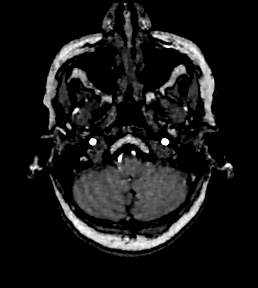
[im 29/168]
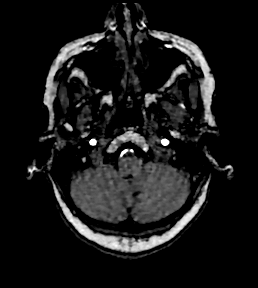
[im 32/168]
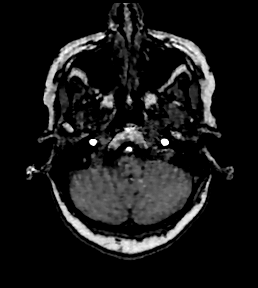
[im 36/168]
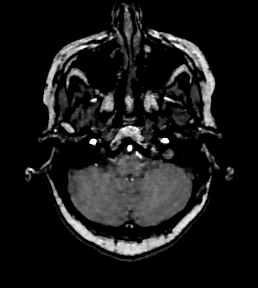
[im 40/168]
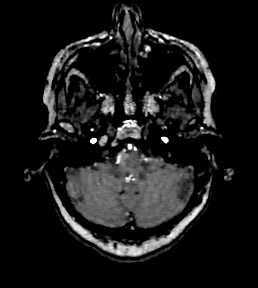
[im 43/168]
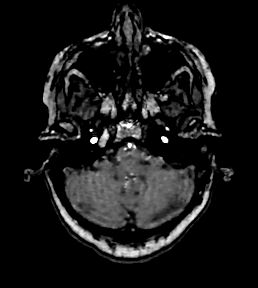
[im 47/168]
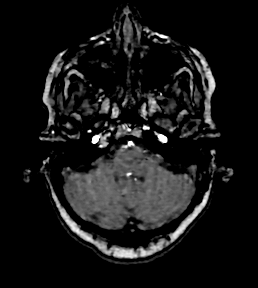
[im 50/168]
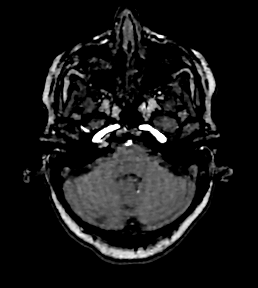
[im 54/168]
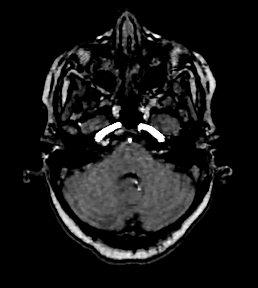
[im 57/168]
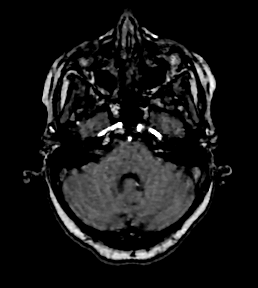
[im 61/168]
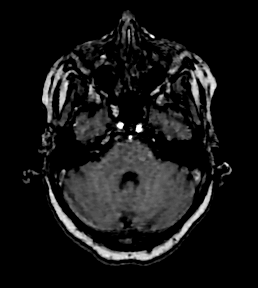
[im 64/168]
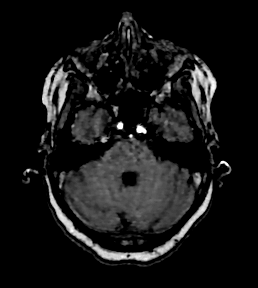
[im 68/168]
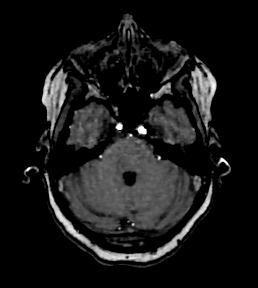
[im 72/168]
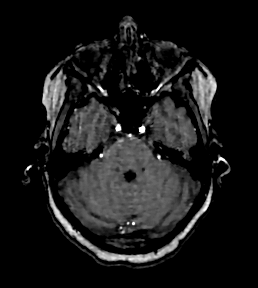
[im 75/168]
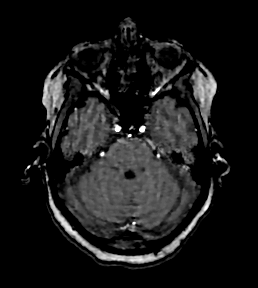
[im 79/168]
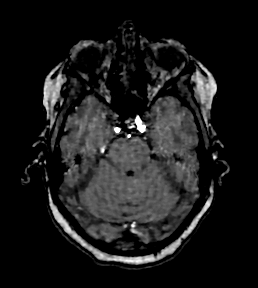
[im 82/168]
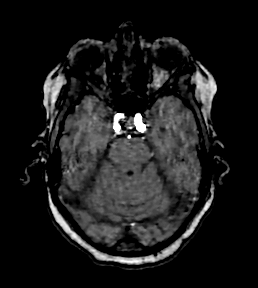
[im 86/168]
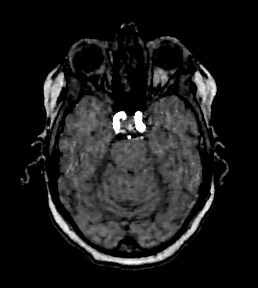
[im 89/168]
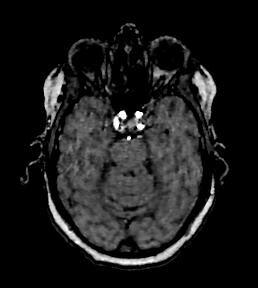
[im 93/168]
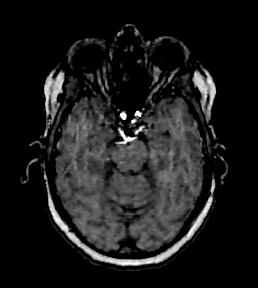
[im 96/168]
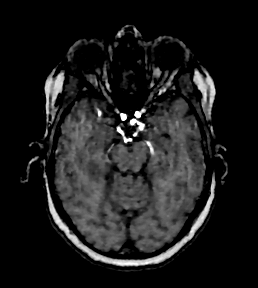
[im 100/168]
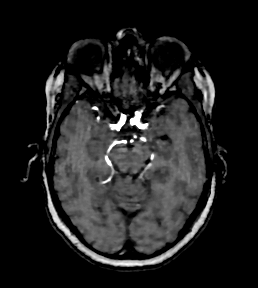
[im 104/168]
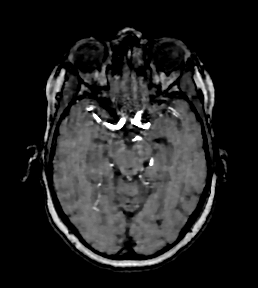
[im 107/168]
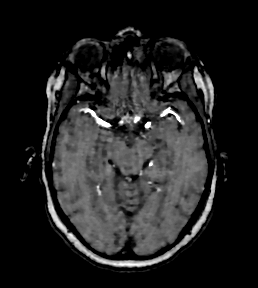
[im 111/168]
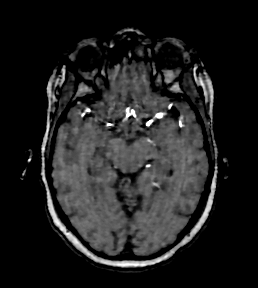
[im 114/168]
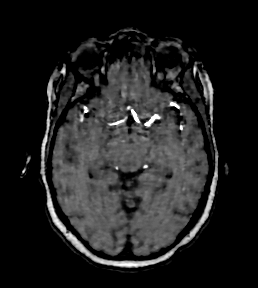
[im 118/168]
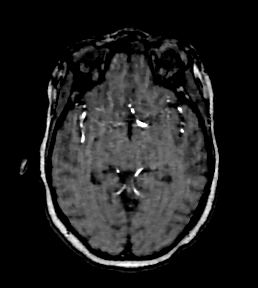
[im 121/168]
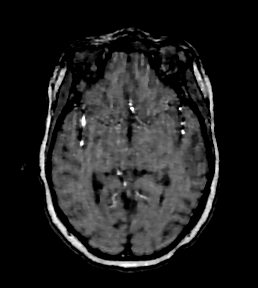
[im 125/168]
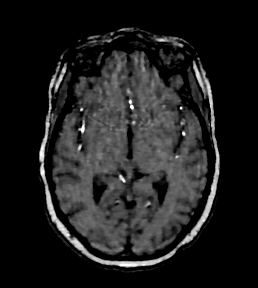
[im 128/168]
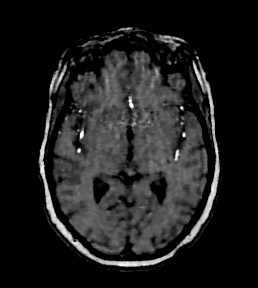
[im 132/168]
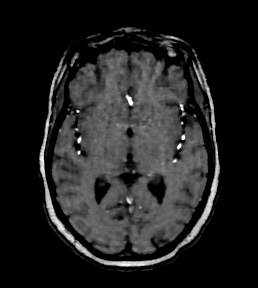
[im 136/168]
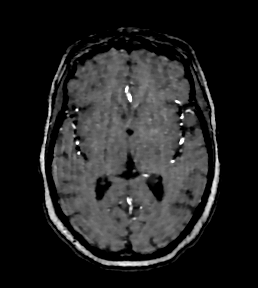
[im 139/168]
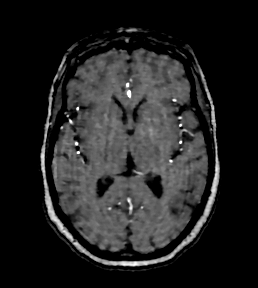
[im 143/168]
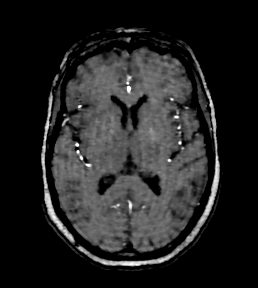
[im 146/168]
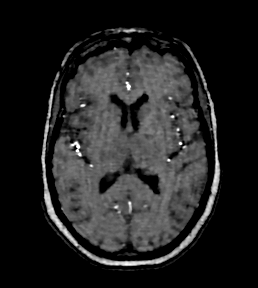
[im 150/168]
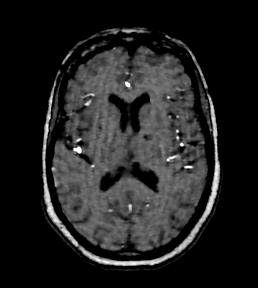
[im 153/168]
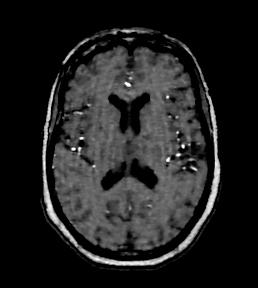
[im 157/168]
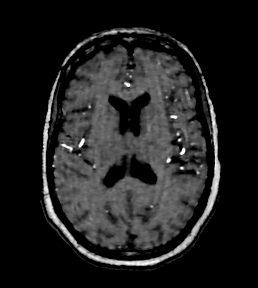
[im 160/168]
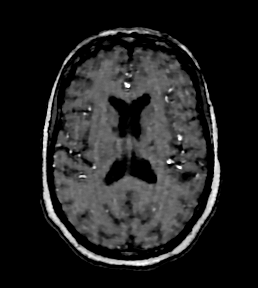
[im 164/168]
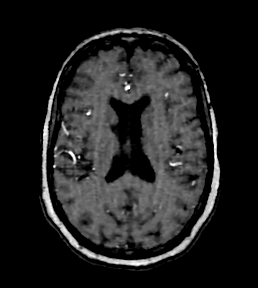
[im 168/168]
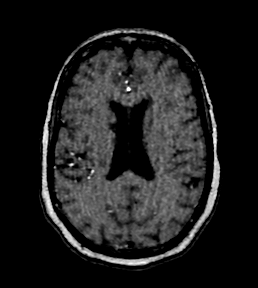

[48 of 48 positions shown; findings below may reference images not displayed]

FINDINGS: MRI HEAD FINDINGS

Brain: Small focus of reduced diffusion in the right corona radiata.

No evidence of intracranial hemorrhage. Patchy foci of T2
hyperintensity in the supratentorial white matter nonspecific but
probably reflect mild chronic microvascular ischemic changes. There
are chronic small vessel infarcts of the right caudate body and left
gangliocapsular region. No intracranial mass or mass effect. No
hydrocephalus or extra-axial collection

Vascular: Major vessel flow voids at the skull base are preserved.

Skull and upper cervical spine: Marrow signal is within normal
limits.

Sinuses/Orbits: Patchy mucosal thickening. Bilateral lens
replacements.

Other: Mastoid air cells are clear.  Sella is unremarkable.

MRA HEAD FINDINGS

Motion artifact is present.

Anterior circulation: Intracranial internal carotid arteries are
patent with atherosclerotic irregularity. There is a wide neck 4.7 x
3.5 mm medially directed aneurysm from the supraclinoid left ICA.
Anterior and middle cerebral arteries are patent.

Posterior circulation: Included intracranial vertebral arteries are
patent. Basilar artery is patent bilateral posterior communicating
arteries are present. Posterior cerebral arteries are patent.
IMPRESSION: Small acute infarct of the right corona radiata.

Mild chronic microvascular ischemic changes. Chronic small vessel
infarcts.

Motion degraded vascular imaging demonstrates no proximal occlusion.
There is a 5 mm aneurysm of the supraclinoid left ICA.

## 2021-01-03 MED ORDER — VITAMIN D 25 MCG (1000 UNIT) PO TABS
2000.0000 [IU] | ORAL_TABLET | Freq: Every day | ORAL | Status: DC
  Administered 2021-01-04 – 2021-01-05 (×2): 2000 [IU] via ORAL
  Filled 2021-01-03 (×2): qty 2

## 2021-01-03 MED ORDER — POTASSIUM CHLORIDE CRYS ER 20 MEQ PO TBCR
40.0000 meq | EXTENDED_RELEASE_TABLET | Freq: Once | ORAL | Status: AC
  Administered 2021-01-03: 20:00:00 40 meq via ORAL
  Filled 2021-01-03: qty 2

## 2021-01-03 MED ORDER — ACYCLOVIR 800 MG PO TABS
400.0000 mg | ORAL_TABLET | Freq: Two times a day (BID) | ORAL | Status: DC
  Administered 2021-01-03 – 2021-01-05 (×4): 400 mg via ORAL
  Filled 2021-01-03 (×4): qty 1

## 2021-01-03 MED ORDER — HYDROCODONE-ACETAMINOPHEN 5-325 MG PO TABS
1.0000 | ORAL_TABLET | Freq: Once | ORAL | Status: AC
  Administered 2021-01-03: 17:00:00 1 via ORAL
  Filled 2021-01-03: qty 1

## 2021-01-03 MED ORDER — FERROUS SULFATE 325 (65 FE) MG PO TABS
325.0000 mg | ORAL_TABLET | Freq: Every day | ORAL | Status: DC
  Administered 2021-01-04 – 2021-01-05 (×2): 325 mg via ORAL
  Filled 2021-01-03 (×2): qty 1

## 2021-01-03 MED ORDER — HYDROCODONE-ACETAMINOPHEN 5-325 MG PO TABS
1.0000 | ORAL_TABLET | Freq: Once | ORAL | Status: AC
  Administered 2021-01-03: 20:00:00 1 via ORAL
  Filled 2021-01-03: qty 1

## 2021-01-03 MED ORDER — ASPIRIN EC 81 MG PO TBEC
81.0000 mg | DELAYED_RELEASE_TABLET | Freq: Every day | ORAL | Status: DC
  Administered 2021-01-04 – 2021-01-05 (×2): 81 mg via ORAL
  Filled 2021-01-03 (×2): qty 1

## 2021-01-03 MED ORDER — HYDROCODONE-ACETAMINOPHEN 5-325 MG PO TABS
1.0000 | ORAL_TABLET | Freq: Four times a day (QID) | ORAL | Status: DC | PRN
  Administered 2021-01-04 – 2021-01-05 (×2): 1 via ORAL
  Filled 2021-01-03 (×2): qty 1

## 2021-01-03 MED ORDER — STROKE: EARLY STAGES OF RECOVERY BOOK: Freq: Once | Status: AC

## 2021-01-03 MED ORDER — ATORVASTATIN CALCIUM 40 MG PO TABS
40.0000 mg | ORAL_TABLET | Freq: Every day | ORAL | Status: DC
  Administered 2021-01-04: 40 mg via ORAL
  Filled 2021-01-03: qty 1

## 2021-01-03 MED ORDER — PANTOPRAZOLE SODIUM 40 MG PO TBEC
80.0000 mg | DELAYED_RELEASE_TABLET | Freq: Every day | ORAL | Status: DC
  Administered 2021-01-04 – 2021-01-05 (×2): 80 mg via ORAL
  Filled 2021-01-03 (×2): qty 2

## 2021-01-03 MED ORDER — PRAMIPEXOLE DIHYDROCHLORIDE 1 MG PO TABS
0.5000 mg | ORAL_TABLET | Freq: Three times a day (TID) | ORAL | Status: DC
  Administered 2021-01-03 – 2021-01-05 (×5): 0.5 mg via ORAL
  Filled 2021-01-03 (×5): qty 1

## 2021-01-03 NOTE — Telephone Encounter (Signed)
I called pt.  She states that she is having trouble ambulating, more weakness on L leg then R leg.  She states she noticed this Saturday when she got up.    Has not been exercising.  She has no other symptoms  The increase of the mirapex she has been taking 0.5mg  tab po tid  I believe she is doing ok,  no new medications.  She did mention she has GERD, (stomach felt queasy) this am.  She is asking about therapy that may help.  She does drive  (ok for outpt therapy).

## 2021-01-03 NOTE — ED Notes (Signed)
[name] son   [phone]    [phone] H   call with any updates . Orian Amberg

## 2021-01-03 NOTE — Progress Notes (Signed)
01/03/2021 2200:  Pt arrived to dept 300 via ED. Telephone report received from [place], ED RN. Pt oriented to unit with belongings at bedside. Stroke swallow screen performed per order. Pt tolerated sips of water well. MD on call notified. Orders reviewed and initiated. Pt resting and call bell within reach.

## 2021-01-03 NOTE — Telephone Encounter (Signed)
Please advise patient that she did not have this weakness when I recently saw her less than a month ago.  Since it came on fairly suddenly, I would recommend that she be evaluated for this more urgently and recommend that she proceed to the emergency room, a stroke cannot be excluded.

## 2021-01-03 NOTE — H&P (Signed)
History and Physical  QUEENIE AUFIERO YKZ:[id number] DOB: April 01, 1950 DOA: 01/03/2021  Referring physician: Evlyn Courier, PA-C  PCP: Curlene Labrum, MD  Patient coming from: Home  Chief Complaint: Left-sided weakness  HPI: ZENYA HICKAM is a 70 y.o. female with medical history significant for left breast cancer (diagnosed on 03/24/2018) s/p lumpectomy (05/06/2018) and adjuvant chemotherapy (06/23/2018) with Adriamycin and Cytoxan x4 followed by Taxol weekly x12, left shoulder pain, GERD, hypertension and Parkinson's disease who presents to the emergency department due to left leg weakness which started on Saturday (12/10) with subsequent abnormal gait.  She Contacted her neurologist at [facility] on the phone today and was asked to go to the ED for further work-up to rule out stroke.  She also complained of left lateral thigh pain and pain underneath left breast which was alleviated with Norco given in the ED.  She complained of left-sided frontal headache, but denies chest pain, shortness of breath, fever, chills, nausea, vomiting, abdominal pain  ED Course:  In the emergency department, she was hemodynamically stable, BP was 156/88 and other vital signs are within normal range.  Work-up in the ED showed normal CBC and BMP except for hypokalemia and elevation of CBG at 120. MRI head without contrast and MRA head without contrast showed small acute infarct of the right corona radiata.  There is a 5 mm aneurysm of the supraclinoid left ICA. Patient was treated with Norco, potassium was replenished.  Hospitalist was asked to admit patient for further evaluation and management.  Review of Systems: Review of systems as noted in the HPI. All other systems reviewed and are negative.   Past Medical History:  Diagnosis Date   Anemia    iron def   Breast cancer ([place])    left, Status post chemotherapy and XRT   CHF (congestive heart failure) (HCC)    DDD (degenerative disc disease), lumbar    Essential  hypertension    Essential tremor    Genital warts    GERD (gastroesophageal reflux disease)    History of anemia    History of renal insufficiency    Hyperlipidemia    Hypertension    Insomnia    Mixed hyperlipidemia    Osteoarthritis    Otitis media    right   Peripheral neuropathy    d/t chemo   Sinus infection 01/04/14   Past Surgical History:  Procedure Laterality Date   APPENDECTOMY  1966   BIOPSY  08/29/2020   Procedure: BIOPSY;  Surgeon: Harvel Quale, MD;  [facility];  Service: Gastroenterology;;   BREAST LUMPECTOMY WITH RADIOACTIVE SEED AND SENTINEL LYMPH NODE BIOPSY Left 05/06/2018   Procedure: LEFT BREAST LUMPECTOMY WITH RADIOACTIVE SEED AND LEFT DEEP AXILLARY SENTINEL LYMPH NODE BIOPSY AND BLUE DYE INJECTION;  Surgeon: Fanny Skates, MD;  Location: [place];  Service: General;  Laterality: Left;   CATARACT EXTRACTION W/PHACO Left 01/10/2014   Procedure: CATARACT EXTRACTION PHACO AND INTRAOCULAR LENS PLACEMENT ; CDE:  4.94;  Surgeon: Williams Che, MD;  Location: [facility];  Service: Ophthalmology;  Laterality: Left;   CESAREAN SECTION  1986   CHOLECYSTECTOMY     COLONOSCOPY WITH PROPOFOL N/A 07/25/2020   Procedure: COLONOSCOPY WITH PROPOFOL;  Surgeon: Harvel Quale, MD;  [facility];  Service: Gastroenterology;  Laterality: N/A;  10:55   ESOPHAGOGASTRODUODENOSCOPY (EGD) WITH PROPOFOL N/A 08/29/2020   Procedure: ESOPHAGOGASTRODUODENOSCOPY (EGD) WITH PROPOFOL;  Surgeon: Harvel Quale, MD;  [facility];  Service: Gastroenterology;  Laterality: N/A;  12:00   MYRINGOTOMY WITH TUBE PLACEMENT Right 02/04/2017   Procedure: REVISION OF RIGHT MYRINGOTOMY WITH TUBE PLACEMENT, WITH EXAM OF LEFT EAR;  Surgeon: Leta Baptist, MD;  Location: [place];  Service: ENT;  Laterality: Right;   NASAL SINUS SURGERY  2016   polypectomy   POLYPECTOMY  07/25/2020   Procedure: POLYPECTOMY;  Surgeon:  Harvel Quale, MD;  [facility];  Service: Gastroenterology;;   POLYPECTOMY  08/29/2020   Procedure: POLYPECTOMY;  Surgeon: Harvel Quale, MD;  [facility];  Service: Gastroenterology;;   [facility] REMOVAL N/A 01/06/2019   Procedure: REMOVAL PORT-A-CATH;  Surgeon: Fanny Skates, MD;  Location: [place];  Service: General;  Laterality: N/A;   PORTACATH PLACEMENT Right 06/16/2018   Procedure: INSERTION PORT-A-CATH;  Surgeon: Fanny Skates, MD;  Location: [place];  Service: General;  Laterality: Right;   SUBMUCOSAL LIFTING INJECTION  08/29/2020   Procedure: SUBMUCOSAL LIFTING INJECTION;  Surgeon: Harvel Quale, MD;  [facility];  Service: Gastroenterology;;   TONSILLECTOMY  1970    Social History:  reports that she has never smoked. She has never used smokeless tobacco. She reports that she does not drink alcohol and does not use drugs.   Allergies  Allergen Reactions   Eggs Or Egg-Derived Products Nausea And Vomiting   Other Nausea And Vomiting    Dairy products\    Penicillins Hives    Has patient had a PCN reaction causing immediate rash, facial/tongue/throat swelling, SOB or lightheadedness with hypotension: No Has patient had a PCN reaction causing severe rash involving mucus membranes or skin necrosis: No Has patient had a PCN reaction that required hospitalization: No Has patient had a PCN reaction occurring within the last 10 years: No If all of the above answers are "NO", then may proceed with Cephalosporin use.     Family History  Problem Relation Age of Onset   Lupus Mother    Heart attack Father        age 71   Prostate cancer Father    Hypothyroidism Sister    Breast cancer Sister    Hypertension Sister    Hypertension Brother    Parkinson's disease Paternal Aunt      Prior to Admission medications   Medication Sig Start Date End Date Taking? Authorizing  Provider  acyclovir (ZOVIRAX) 400 MG tablet Take 400 mg by mouth 2 (two) times daily.    Yes [provider]  ALPRAZolam (XANAX) 0.5 MG tablet Take 0.5 mg by mouth every 8 (eight) hours as needed for anxiety. 07/19/20  Yes [provider]  cetirizine (ZYRTEC) 10 MG tablet Take 10 mg by mouth daily.   Yes [provider]  Cholecalciferol (VITAMIN D) 2000 UNITS tablet Take 2,000 Units by mouth daily.   Yes [provider]  ferrous sulfate 325 (65 FE) MG tablet Take 325 mg by mouth daily with breakfast.   Yes [provider]  hyoscyamine (LEVSIN SL) 0.125 MG SL tablet Place 1 tablet (0.125 mg total) under the tongue every 6 (six) hours as needed (abdominal pain). 07/13/20  Yes Harvel Quale, MD  metoprolol succinate (TOPROL-XL) 25 MG 24 hr tablet Take 25 mg by mouth daily. 05/28/17  Yes [provider]  omeprazole (PRILOSEC) 40 MG capsule Take 1 capsule (40 mg total) by mouth daily. 11/27/20  Yes Harvel Quale, MD  pramipexole (MIRAPEX) 0.5 MG tablet Take  1 tablet (0.5 mg total) by mouth 3 (three) times daily. 12/07/20  Yes Star Age, MD  PROCTO-MED HC 2.5 % rectal cream Place rectally 2 (two) times daily as needed. 07/19/20  Yes [provider]  zolpidem (AMBIEN) 10 MG tablet Take 5 mg by mouth at bedtime as needed for sleep.   Yes [provider]  benzonatate (TESSALON) 100 MG capsule Take by mouth 3 (three) times daily as needed for cough. Patient not taking: Reported on 01/03/2021    [provider]  Cyanocobalamin 5000 MCG TBDP Take 5,000 mcg by mouth daily. Patient not taking: Reported on 01/03/2021    [provider]  meloxicam (MOBIC) 15 MG tablet Take 15 mg by mouth daily as needed. Patient not taking: Reported on 01/03/2021 10/01/20   [provider]    Physical Exam: BP (!) 164/93    Pulse 66    Temp 97.9 F (36.6 C) (Oral)    Resp 16    Ht 5\' 3"  (1.6 m)    Wt 60.8 kg     SpO2 98%    BMI 23.74 kg/m   General: 69 y.o. year-old female well developed well nourished in no acute distress.  Alert and oriented x3. HEENT: NCAT, EOMI Neck: Supple, trachea medial Cardiovascular: Regular rate and rhythm with no rubs or gallops.  No thyromegaly or JVD noted.  No lower extremity edema. 2/4 pulses in all 4 extremities. Respiratory: Clear to auscultation with no wheezes or rales. Good inspiratory effort. Abdomen: Soft, nontender nondistended with normal bowel sounds x4 quadrants. Muskuloskeletal: No cyanosis, clubbing or edema noted bilaterally Neuro: CN II-XII intact, strength 5/5 x 4, sensation, reflexes intact Skin: No ulcerative lesions noted or rashes Psychiatry: Judgement and insight appear normal. Mood is appropriate for condition and setting          Labs on Admission:  Basic Metabolic Panel: Recent Labs  Lab 01/03/21 1736  NA 140  K 3.4*  CL 107  CO2 25  GLUCOSE 120*  BUN 11  CREATININE 0.79  CALCIUM 8.9   Liver Function Tests: No results for input(s): AST, ALT, ALKPHOS, BILITOT, PROT, ALBUMIN in the last 168 hours. No results for input(s): LIPASE, AMYLASE in the last 168 hours. No results for input(s): AMMONIA in the last 168 hours. CBC: Recent Labs  Lab 01/03/21 1736  WBC 5.7  HGB 12.6  HCT 38.8  MCV 86.2  PLT 232   Cardiac Enzymes: No results for input(s): CKTOTAL, CKMB, CKMBINDEX, TROPONINI in the last 168 hours.  BNP (last 3 results) No results for input(s): BNP in the last 8760 hours.  ProBNP (last 3 results) No results for input(s): PROBNP in the last 8760 hours.  CBG: No results for input(s): GLUCAP in the last 168 hours.  Radiological Exams on Admission: MR ANGIO HEAD WO CONTRAST  Result Date: 01/03/2021 CLINICAL DATA:  Ataxia or coordination problem; left lower extremity weakness and dysequilibrium EXAM: MRI HEAD WITHOUT CONTRAST MRA HEAD WITHOUT CONTRAST TECHNIQUE: Multiplanar, multi-echo pulse sequences of the brain  and surrounding structures were acquired without intravenous contrast. Angiographic images of the Circle of Willis were acquired using MRA technique without intravenous contrast. COMPARISON:  No pertinent prior exam. FINDINGS: MRI HEAD FINDINGS Brain: Small focus of reduced diffusion in the right corona radiata. No evidence of intracranial hemorrhage. Patchy foci of T2 hyperintensity in the supratentorial white matter nonspecific but probably reflect mild chronic microvascular ischemic changes. There are chronic small vessel infarcts of the right caudate body and  left gangliocapsular region. No intracranial mass or mass effect. No hydrocephalus or extra-axial collection Vascular: Major vessel flow voids at the skull base are preserved. Skull and upper cervical spine: Marrow signal is within normal limits. Sinuses/Orbits: Patchy mucosal thickening. Bilateral lens replacements. Other: Mastoid air cells are clear.  Sella is unremarkable. MRA HEAD FINDINGS Motion artifact is present. Anterior circulation: Intracranial internal carotid arteries are patent with atherosclerotic irregularity. There is a wide neck 4.7 x 3.5 mm medially directed aneurysm from the supraclinoid left ICA. Anterior and middle cerebral arteries are patent. Posterior circulation: Included intracranial vertebral arteries are patent. Basilar artery is patent bilateral posterior communicating arteries are present. Posterior cerebral arteries are patent. IMPRESSION: Small acute infarct of the right corona radiata. Mild chronic microvascular ischemic changes. Chronic small vessel infarcts. Motion degraded vascular imaging demonstrates no proximal occlusion. There is a 5 mm aneurysm of the supraclinoid left ICA. Electronically Signed   By: Macy Mis M.D.   On: 01/03/2021 18:40   MR BRAIN WO CONTRAST  Result Date: 01/03/2021 CLINICAL DATA:  Ataxia or coordination problem; left lower extremity weakness and dysequilibrium EXAM: MRI HEAD WITHOUT  CONTRAST MRA HEAD WITHOUT CONTRAST TECHNIQUE: Multiplanar, multi-echo pulse sequences of the brain and surrounding structures were acquired without intravenous contrast. Angiographic images of the Circle of Willis were acquired using MRA technique without intravenous contrast. COMPARISON:  No pertinent prior exam. FINDINGS: MRI HEAD FINDINGS Brain: Small focus of reduced diffusion in the right corona radiata. No evidence of intracranial hemorrhage. Patchy foci of T2 hyperintensity in the supratentorial white matter nonspecific but probably reflect mild chronic microvascular ischemic changes. There are chronic small vessel infarcts of the right caudate body and left gangliocapsular region. No intracranial mass or mass effect. No hydrocephalus or extra-axial collection Vascular: Major vessel flow voids at the skull base are preserved. Skull and upper cervical spine: Marrow signal is within normal limits. Sinuses/Orbits: Patchy mucosal thickening. Bilateral lens replacements. Other: Mastoid air cells are clear.  Sella is unremarkable. MRA HEAD FINDINGS Motion artifact is present. Anterior circulation: Intracranial internal carotid arteries are patent with atherosclerotic irregularity. There is a wide neck 4.7 x 3.5 mm medially directed aneurysm from the supraclinoid left ICA. Anterior and middle cerebral arteries are patent. Posterior circulation: Included intracranial vertebral arteries are patent. Basilar artery is patent bilateral posterior communicating arteries are present. Posterior cerebral arteries are patent. IMPRESSION: Small acute infarct of the right corona radiata. Mild chronic microvascular ischemic changes. Chronic small vessel infarcts. Motion degraded vascular imaging demonstrates no proximal occlusion. There is a 5 mm aneurysm of the supraclinoid left ICA. Electronically Signed   By: Macy Mis M.D.   On: 01/03/2021 18:40    EKG: I independently viewed the EKG done and my findings are as  followed: Normal sinus rhythm with frequent PVCs at a rate of 80 bpm  Assessment/Plan Present on Admission:  GERD (gastroesophageal reflux disease)  Essential hypertension  Principal Problem:   Acute ischemic stroke [facility]) Active Problems:   Essential hypertension   GERD (gastroesophageal reflux disease)   Malignant neoplasm of upper-inner quadrant of left breast in female, estrogen receptor positive (HCC)   Hypokalemia  Acute ischemic stroke MRI of brain of head without contrast showed small acute infarct of the right corona radiata Patient will be admitted to telemetry unit  Echocardiogram in the morning Continue aspirin and statin Continue fall precautions, aspiration precaution and neuro checks Lipid panel and hemoglobin A1c will be checked Continue PT/OT eval and treat Bedside swallow  eval by nursing prior to diet Tele neurology will be consulted and we shall await further recommendations  Cerebral aneurysm MRA head without contrast showed 5 mm aneurysm of the supraclinoid left ICA Neurology consulted, we shall await further recommendation Patient may need outpatient follow-up/monitoring with neurosurgery  Hypokalemia K+ is 3.4 K+ will be replenished Please monitor for AM K+ for further replenishmemnt  Musculoskeletal pain Continue Norco as needed  Essential hypertension Antihypertensives PRN if Blood pressure is greater than 220/120 or there is a concern for End organ damage/contraindications for permissive HTN. If blood pressure is greater than 220/120 give labetalol PO or IV or Vasotec IV with a goal of 15% reduction in BP during the first 24 hours.  GERD Continue Protonix  Parkinson's disease Continue pramipexole  History of left breast cancer s/p lumpectomy and adjuvant chemotherapy Stable.  Follows with [facility] : [facility] care [facility], [place] (Dr. Sharyn Dross) and (Dr. Eustaquio Maize oncology)  Other home meds Zovirax, vitamin D, ferrous  sulfate   DVT prophylaxis: SCDs  Code Status: Full code  Family Communication: None at bedside  Disposition Plan:  Patient is from:                        home Anticipated DC to:                   SNF or family members home Anticipated DC date:               2-3 days Anticipated DC barriers:          Patient requires inpatient management due to acute ischemic stroke pending further stroke work-up  Consults called: Neurology  Admission status: Observation    Bernadette Hoit MD [facility]  01/03/2021, 8:55 PM

## 2021-01-03 NOTE — ED Triage Notes (Signed)
Pt with left arm and leg pain.  Pain as well under left breast. + nausea and mild SOB

## 2021-01-03 NOTE — ED Provider Notes (Addendum)
[facility] Provider Note   CSN: [id number] Arrival date & time: 01/03/21  1538     History No chief complaint on file.   Kimberly Mccormick is a 70 y.o. female.  70 year old female presents today for evaluation of left lower extremity weakness since Saturday.  She does have history of Parkinson's and has a shuffling gait but reports over the past few days she has felt off balance.  She has been in contact with her neurologist over the phone and today was instructed to come into the emergency room to rule out stroke.  She also endorses pain in the posterior thigh, left shoulder pain for the past day.  She denies fever, chills, abdominal pain, vomiting, or lightheadedness.  She has not taken anything prior to arrival for pain control.  Her son is at bedside.  The history is provided by the patient. No language interpreter was used.      Past Medical History:  Diagnosis Date   Anemia    iron def   Breast cancer ([place])    left, Status post chemotherapy and XRT   CHF (congestive heart failure) (HCC)    DDD (degenerative disc disease), lumbar    Essential hypertension    Essential tremor    Genital warts    GERD (gastroesophageal reflux disease)    History of anemia    History of renal insufficiency    Hyperlipidemia    Hypertension    Insomnia    Mixed hyperlipidemia    Osteoarthritis    Otitis media    right   Peripheral neuropathy    d/t chemo   Sinus infection 01/04/14    Patient Active Problem List   Diagnosis Date Noted   Early satiety 11/27/2020   Chronic abdominal pain 07/13/2020   Port-A-Cath in place 06/23/2018   Malignant neoplasm of upper-inner quadrant of left breast in female, estrogen receptor positive ([place]) 04/16/2018   Essential hypertension 03/21/2015   High cholesterol 03/21/2015   Genital herpes 03/21/2015   Fecal urgency 03/21/2015   GERD (gastroesophageal reflux disease) 03/21/2015    Past Surgical History:  Procedure  Laterality Date   APPENDECTOMY  1966   BIOPSY  08/29/2020   Procedure: BIOPSY;  Surgeon: Harvel Quale, MD;  [facility];  Service: Gastroenterology;;   BREAST LUMPECTOMY WITH RADIOACTIVE SEED AND SENTINEL LYMPH NODE BIOPSY Left 05/06/2018   Procedure: LEFT BREAST LUMPECTOMY WITH RADIOACTIVE SEED AND LEFT DEEP AXILLARY SENTINEL LYMPH NODE BIOPSY AND BLUE DYE INJECTION;  Surgeon: Fanny Skates, MD;  Location: [facility];  Service: General;  Laterality: Left;   CATARACT EXTRACTION W/PHACO Left 01/10/2014   Procedure: CATARACT EXTRACTION PHACO AND INTRAOCULAR LENS PLACEMENT ; CDE:  4.94;  Surgeon: Williams Che, MD;  Location: [facility];  Service: Ophthalmology;  Laterality: Left;   CESAREAN SECTION  1986   CHOLECYSTECTOMY     COLONOSCOPY WITH PROPOFOL N/A 07/25/2020   Procedure: COLONOSCOPY WITH PROPOFOL;  Surgeon: Harvel Quale, MD;  [facility];  Service: Gastroenterology;  Laterality: N/A;  10:55   ESOPHAGOGASTRODUODENOSCOPY (EGD) WITH PROPOFOL N/A 08/29/2020   Procedure: ESOPHAGOGASTRODUODENOSCOPY (EGD) WITH PROPOFOL;  Surgeon: Harvel Quale, MD;  [facility];  Service: Gastroenterology;  Laterality: N/A;  12:00   MYRINGOTOMY WITH TUBE PLACEMENT Right 02/04/2017   Procedure: REVISION OF RIGHT MYRINGOTOMY WITH TUBE PLACEMENT, WITH EXAM OF LEFT EAR;  Surgeon: Leta Baptist, MD;  Location: [facility];  Service: ENT;  Laterality: Right;   NASAL SINUS SURGERY  2016   polypectomy   POLYPECTOMY  07/25/2020   Procedure: POLYPECTOMY;  Surgeon: Harvel Quale, MD;  [facility];  Service: Gastroenterology;;   POLYPECTOMY  08/29/2020   Procedure: POLYPECTOMY;  Surgeon: Harvel Quale, MD;  [facility];  Service: Gastroenterology;;   [facility] REMOVAL N/A 01/06/2019   Procedure: REMOVAL PORT-A-CATH;  Surgeon: Fanny Skates, MD;  Location: [facility];   Service: General;  Laterality: N/A;   PORTACATH PLACEMENT Right 06/16/2018   Procedure: INSERTION PORT-A-CATH;  Surgeon: Fanny Skates, MD;  Location: [facility];  Service: General;  Laterality: Right;   SUBMUCOSAL LIFTING INJECTION  08/29/2020   Procedure: SUBMUCOSAL LIFTING INJECTION;  Surgeon: Harvel Quale, MD;  [facility];  Service: Gastroenterology;;   TONSILLECTOMY  1970     OB History   No obstetric history on file.     Family History  Problem Relation Age of Onset   Lupus Mother    Heart attack Father        age 64   Prostate cancer Father    Hypothyroidism Sister    Breast cancer Sister    Hypertension Sister    Hypertension Brother    Parkinson's disease Paternal Aunt     Social History   Tobacco Use   Smoking status: Never   Smokeless tobacco: Never  Vaping Use   Vaping Use: Never used  Substance Use Topics   Alcohol use: No   Drug use: No    Home Medications Prior to Admission medications   Medication Sig Start Date End Date Taking? Authorizing Provider  acyclovir (ZOVIRAX) 400 MG tablet Take 400 mg by mouth 2 (two) times daily.    Yes [provider]  ALPRAZolam (XANAX) 0.5 MG tablet Take 0.5 mg by mouth every 8 (eight) hours as needed for anxiety. 07/19/20  Yes [provider]  cetirizine (ZYRTEC) 10 MG tablet Take 10 mg by mouth daily.   Yes [provider]  Cholecalciferol (VITAMIN D) 2000 UNITS tablet Take 2,000 Units by mouth daily.   Yes [provider]  ferrous sulfate 325 (65 FE) MG tablet Take 325 mg by mouth daily with breakfast.   Yes [provider]  hyoscyamine (LEVSIN SL) 0.125 MG SL tablet Place 1 tablet (0.125 mg total) under the tongue every 6 (six) hours as needed (abdominal pain). 07/13/20  Yes Harvel Quale, MD  metoprolol succinate (TOPROL-XL) 25 MG 24 hr tablet Take 25 mg by mouth daily. 05/28/17  Yes [provider]  omeprazole  (PRILOSEC) 40 MG capsule Take 1 capsule (40 mg total) by mouth daily. 11/27/20  Yes Harvel Quale, MD  pramipexole (MIRAPEX) 0.5 MG tablet Take 1 tablet (0.5 mg total) by mouth 3 (three) times daily. 12/07/20  Yes Star Age, MD  PROCTO-MED HC 2.5 % rectal cream Place rectally 2 (two) times daily as needed. 07/19/20  Yes [provider]  zolpidem (AMBIEN) 10 MG tablet Take 5 mg by mouth at bedtime as needed for sleep.   Yes [provider]  benzonatate (TESSALON) 100 MG capsule Take by mouth 3 (three) times daily as needed for cough. Patient not taking: Reported on 01/03/2021    [provider]  Cyanocobalamin 5000 MCG TBDP Take 5,000 mcg by mouth daily. Patient not taking: Reported on 01/03/2021    [provider]  meloxicam (MOBIC) 15 MG tablet Take 15 mg by mouth  daily as needed. Patient not taking: Reported on 01/03/2021 10/01/20   [provider]    Allergies    Eggs or egg-derived products, Other, and Penicillins  Review of Systems   Review of Systems  Constitutional:  Negative for activity change, chills and fever.  Respiratory:  Negative for shortness of breath.   Cardiovascular:  Negative for chest pain.  Gastrointestinal:  Negative for abdominal pain, nausea and vomiting.  Musculoskeletal:  Positive for gait problem (Disequilibrium). Negative for back pain.  Neurological:  Positive for weakness. Negative for light-headedness and headaches.  All other systems reviewed and are negative.  Physical Exam Updated Vital Signs BP (!) 156/88 (BP Location: Left Arm)    Pulse 80    Temp 97.9 F (36.6 C) (Oral)    Resp 16    Ht 5\' 3"  (1.6 m)    Wt 60.8 kg    SpO2 99%    BMI 23.74 kg/m   Physical Exam Vitals and nursing note reviewed.  Constitutional:      General: She is not in acute distress.    Appearance: Normal appearance. She is not ill-appearing.  HENT:     Head: Normocephalic and atraumatic.     Nose: Nose normal.   Eyes:     General: No scleral icterus.    Extraocular Movements: Extraocular movements intact.     Conjunctiva/sclera: Conjunctivae normal.  Cardiovascular:     Rate and Rhythm: Normal rate and regular rhythm.     Pulses: Normal pulses.     Heart sounds: Normal heart sounds.  Pulmonary:     Effort: Pulmonary effort is normal. No respiratory distress.     Breath sounds: Normal breath sounds. No wheezing or rales.  Abdominal:     General: There is no distension.     Tenderness: There is no abdominal tenderness.  Musculoskeletal:        General: No deformity. Normal range of motion.     Cervical back: Normal range of motion.     Right lower leg: No edema.     Left lower leg: No edema.     Comments: Cervical, thoracic, lumbar spine without tenderness to palpation.  Left shoulder without tenderness to palpation.  Bilateral upper and lower extremities with full range of motion.  Area of pain on posterior left lower extremity is without tenderness to palpation.  Skin:    General: Skin is warm and dry.  Neurological:     General: No focal deficit present.     Mental Status: She is alert. Mental status is at baseline.     Comments: Strength in bilateral upper extremities and bilateral lower extremities 5/5.  Upper and lower extremities without pronator drift.  Sensation in upper and lower extremities intact and symmetrical.  2+ DP pulses present.  Ambulated patient within the room.  She exhibited some ataxia/disequilibrium which she states is not typical for her with her Parkinson's.     ED Results / Procedures / Treatments   Labs (all labs ordered are listed, but only abnormal results are displayed) Labs Reviewed  CBC  BASIC METABOLIC PANEL    EKG None  Radiology No results found.  Procedures Procedures   Medications Ordered in ED Medications  HYDROcodone-acetaminophen (NORCO/VICODIN) 5-325 MG per tablet 1 tablet (has no administration in time range)    ED Course  I have  reviewed the triage vital signs and the nursing notes.  Pertinent labs & imaging results that were available during my care of the  patient were reviewed by me and considered in my medical decision making (see chart for details).    MDM Rules/Calculators/A&P                           70 year old female with past medical history of Parkinson's presents today for evaluation of left lower extremity weakness since Saturday along with disequilibrium.  She states she also developed left arm pain, pain in her posterior thigh, pain under her left breast over the past couple days.  She has been in contact with her neurologist recommended she come in to the emergency room for evaluation of stroke.  Neuro exam significant for ataxic gait.  BMP significant for potassium 3.4 glucose of 120 otherwise unremarkable.  CBC unremarkable.  MRI brain and MRA head done which is significant for acute RCA stroke.   Discussed with hospitalist who will evaluate patient for admission.  40 mEq of potassium provided.  Patient and son are aware and in agreement with plan.  Final Clinical Impression(s) / ED Diagnoses Final diagnoses:  Acute CVA (cerebrovascular accident) [facility])    Rx / [place] Orders ED Discharge Orders     None        Evlyn Courier, PA-C 01/03/21 2007    Dionysios Massman, PA-C 01/03/21 2151    Davonna Belling, MD 01/04/21 [phone]

## 2021-01-03 NOTE — Telephone Encounter (Signed)
I called pt back and relayed message from Dr. Rexene Alberts (see urgently for sudden sx (Saturday of L leg weakness (a stroke cannot be excluded).  She verbalized understanding. She said she went for a walk and is feeling better.  She will speak to her son, and then will let us know what she decides to do.

## 2021-01-04 ENCOUNTER — Observation Stay (HOSPITAL_COMMUNITY): Payer: Medicare Other

## 2021-01-04 DIAGNOSIS — I6389 Other cerebral infarction: Secondary | ICD-10-CM | POA: Diagnosis not present

## 2021-01-04 DIAGNOSIS — Z9049 Acquired absence of other specified parts of digestive tract: Secondary | ICD-10-CM | POA: Diagnosis not present

## 2021-01-04 DIAGNOSIS — M7918 Myalgia, other site: Secondary | ICD-10-CM | POA: Diagnosis present

## 2021-01-04 DIAGNOSIS — M1990 Unspecified osteoarthritis, unspecified site: Secondary | ICD-10-CM | POA: Diagnosis present

## 2021-01-04 DIAGNOSIS — G8314 Monoplegia of lower limb affecting left nondominant side: Secondary | ICD-10-CM | POA: Diagnosis present

## 2021-01-04 DIAGNOSIS — Z803 Family history of malignant neoplasm of breast: Secondary | ICD-10-CM | POA: Diagnosis not present

## 2021-01-04 DIAGNOSIS — G20 Parkinson's disease: Secondary | ICD-10-CM | POA: Diagnosis present

## 2021-01-04 DIAGNOSIS — E876 Hypokalemia: Secondary | ICD-10-CM | POA: Diagnosis present

## 2021-01-04 DIAGNOSIS — Z79899 Other long term (current) drug therapy: Secondary | ICD-10-CM | POA: Diagnosis not present

## 2021-01-04 DIAGNOSIS — I639 Cerebral infarction, unspecified: Secondary | ICD-10-CM | POA: Diagnosis not present

## 2021-01-04 DIAGNOSIS — Z923 Personal history of irradiation: Secondary | ICD-10-CM | POA: Diagnosis not present

## 2021-01-04 DIAGNOSIS — I10 Essential (primary) hypertension: Secondary | ICD-10-CM | POA: Diagnosis not present

## 2021-01-04 DIAGNOSIS — G629 Polyneuropathy, unspecified: Secondary | ICD-10-CM | POA: Diagnosis present

## 2021-01-04 DIAGNOSIS — D509 Iron deficiency anemia, unspecified: Secondary | ICD-10-CM | POA: Diagnosis present

## 2021-01-04 DIAGNOSIS — E782 Mixed hyperlipidemia: Secondary | ICD-10-CM | POA: Diagnosis not present

## 2021-01-04 DIAGNOSIS — G40909 Epilepsy, unspecified, not intractable, without status epilepticus: Secondary | ICD-10-CM | POA: Diagnosis present

## 2021-01-04 DIAGNOSIS — Z8249 Family history of ischemic heart disease and other diseases of the circulatory system: Secondary | ICD-10-CM | POA: Diagnosis not present

## 2021-01-04 DIAGNOSIS — I720 Aneurysm of carotid artery: Secondary | ICD-10-CM | POA: Diagnosis present

## 2021-01-04 DIAGNOSIS — G4700 Insomnia, unspecified: Secondary | ICD-10-CM | POA: Diagnosis present

## 2021-01-04 DIAGNOSIS — Z170 Estrogen receptor positive status [ER+]: Secondary | ICD-10-CM | POA: Diagnosis not present

## 2021-01-04 DIAGNOSIS — Z880 Allergy status to penicillin: Secondary | ICD-10-CM | POA: Diagnosis not present

## 2021-01-04 DIAGNOSIS — C50212 Malignant neoplasm of upper-inner quadrant of left female breast: Secondary | ICD-10-CM | POA: Diagnosis present

## 2021-01-04 DIAGNOSIS — Z9221 Personal history of antineoplastic chemotherapy: Secondary | ICD-10-CM | POA: Diagnosis not present

## 2021-01-04 DIAGNOSIS — K219 Gastro-esophageal reflux disease without esophagitis: Secondary | ICD-10-CM | POA: Diagnosis present

## 2021-01-04 DIAGNOSIS — Z8673 Personal history of transient ischemic attack (TIA), and cerebral infarction without residual deficits: Secondary | ICD-10-CM | POA: Diagnosis not present

## 2021-01-04 LAB — CBC
HCT: 37 % (ref 36.0–46.0)
Hemoglobin: 11.9 g/dL — ABNORMAL LOW (ref 12.0–15.0)
MCH: 27 pg (ref 26.0–34.0)
MCHC: 32.2 g/dL (ref 30.0–36.0)
MCV: 84.1 fL (ref 80.0–100.0)
Platelets: 226 10*3/uL (ref 150–400)
RBC: 4.4 MIL/uL (ref 3.87–5.11)
RDW: 13.2 % (ref 11.5–15.5)
WBC: 5.6 10*3/uL (ref 4.0–10.5)
nRBC: 0 % (ref 0.0–0.2)

## 2021-01-04 LAB — COMPREHENSIVE METABOLIC PANEL
ALT: 29 U/L (ref 0–44)
AST: 28 U/L (ref 15–41)
Albumin: 3.6 g/dL (ref 3.5–5.0)
Alkaline Phosphatase: 85 U/L (ref 38–126)
Anion gap: 9 (ref 5–15)
BUN: 14 mg/dL (ref 8–23)
CO2: 25 mmol/L (ref 22–32)
Calcium: 9.1 mg/dL (ref 8.9–10.3)
Chloride: 108 mmol/L (ref 98–111)
Creatinine, Ser: 0.79 mg/dL (ref 0.44–1.00)
GFR, Estimated: >60 mL/min (ref >60)
Glucose, Bld: 102 mg/dL — ABNORMAL HIGH (ref 70–99)
Potassium: 4.3 mmol/L (ref 3.5–5.1)
Sodium: 142 mmol/L (ref 135–145)
Total Bilirubin: 1.2 mg/dL (ref 0.3–1.2)
Total Protein: 6.8 g/dL (ref 6.5–8.1)

## 2021-01-04 LAB — ECHOCARDIOGRAM COMPLETE
AR max vel: 2.18 cm2
AV Area VTI: 2.19 cm2
AV Area mean vel: 1.99 cm2
AV Mean grad: 4 mmHg
AV Peak grad: 7.3 mmHg
Ao pk vel: 1.35 m/s
Area-P 1/2: 2.5 cm2
Calc EF: 59.7 %
Height: 64 in
MV VTI: 2.23 cm2
S' Lateral: 2.8 cm
Single Plane A2C EF: 68.6 %
Single Plane A4C EF: 50.3 %
Weight: 2127 oz

## 2021-01-04 LAB — LIPID PANEL
Cholesterol: 244 mg/dL — ABNORMAL HIGH (ref 0–200)
HDL: 63 mg/dL (ref >40)
LDL Cholesterol: 166 mg/dL — ABNORMAL HIGH (ref 0–99)
Total CHOL/HDL Ratio: 3.9 RATIO
Triglycerides: 73 mg/dL (ref <150)
VLDL: 15 mg/dL (ref 0–40)

## 2021-01-04 LAB — HEMOGLOBIN A1C
Hgb A1c MFr Bld: 5.9 % — ABNORMAL HIGH (ref 4.8–5.6)
Mean Plasma Glucose: 122.63 mg/dL

## 2021-01-04 LAB — PHOSPHORUS: Phosphorus: 3.4 mg/dL (ref 2.5–4.6)

## 2021-01-04 LAB — MAGNESIUM: Magnesium: 2.1 mg/dL (ref 1.7–2.4)

## 2021-01-04 IMAGING — US US CAROTID DUPLEX BILAT
1 series · 14 of 24 positions shown · non-contrast
Comparison: None.

CLINICAL DATA: Stroke

Hypertension
Hyperlipidemia
EXAM:
BILATERAL CAROTID DUPLEX ULTRASOUND
TECHNIQUE: Gray scale imaging, color Doppler and duplex ultrasound were
performed of bilateral carotid and vertebral arteries in the neck.

[Series 1: us carotid bilateral · 14 of 68 slices shown]
[im 1/68]
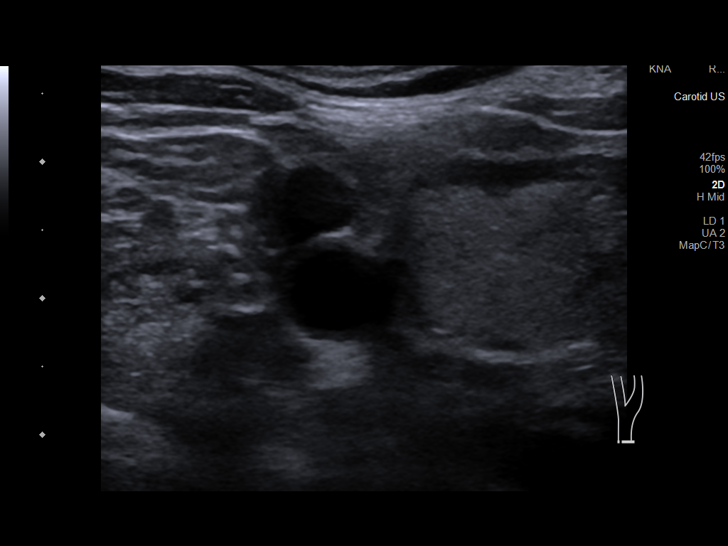
[im 6/68]
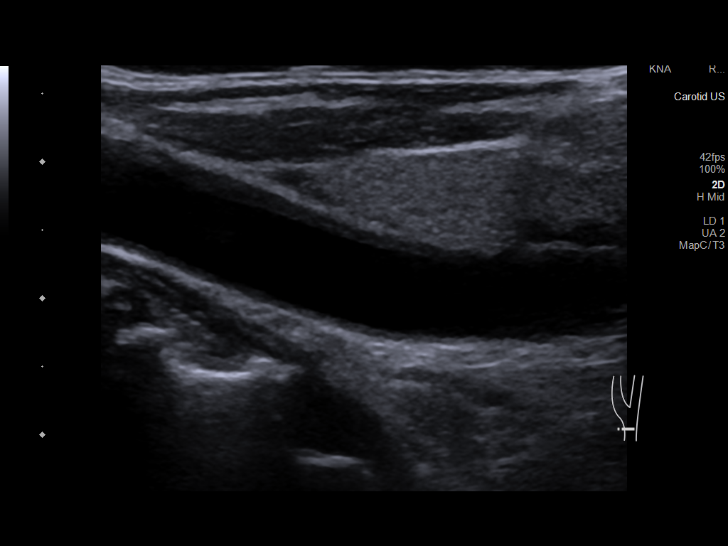
[im 12/68]
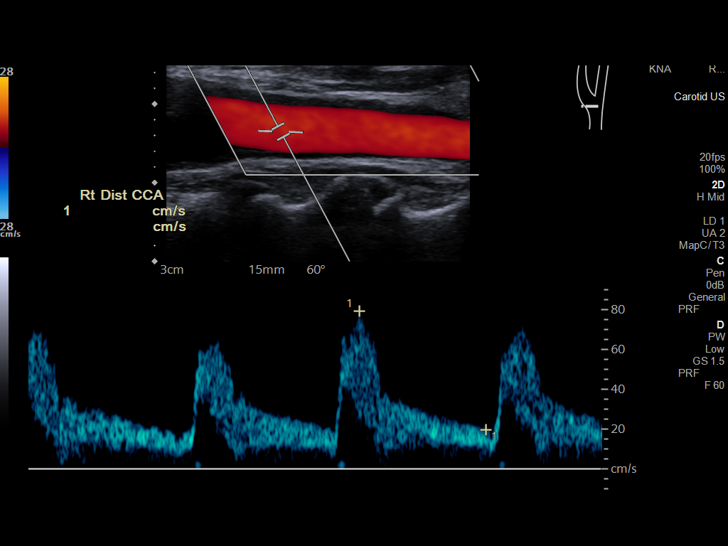
[im 18/68]
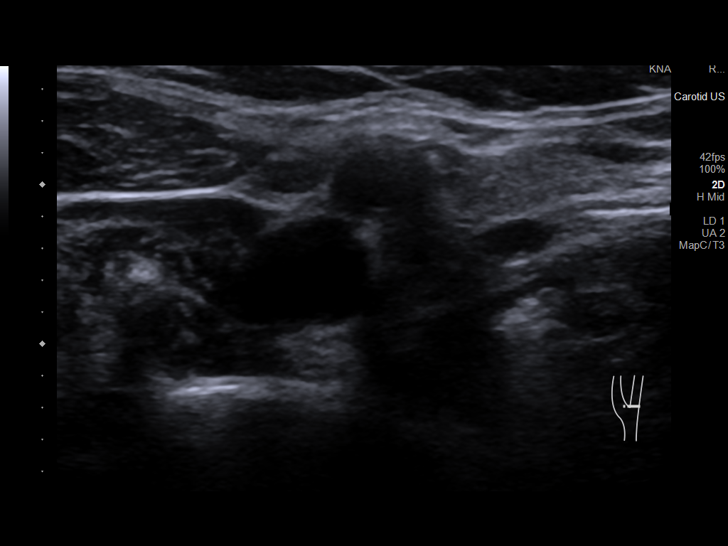
[im 21/68]
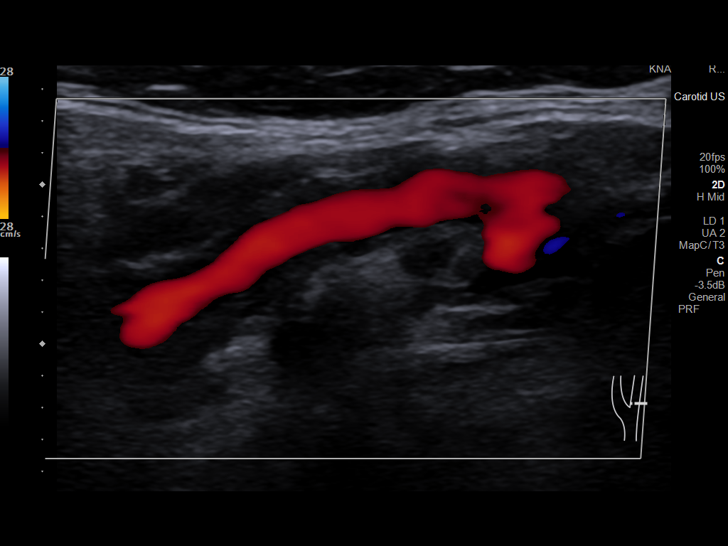
[im 27/68]
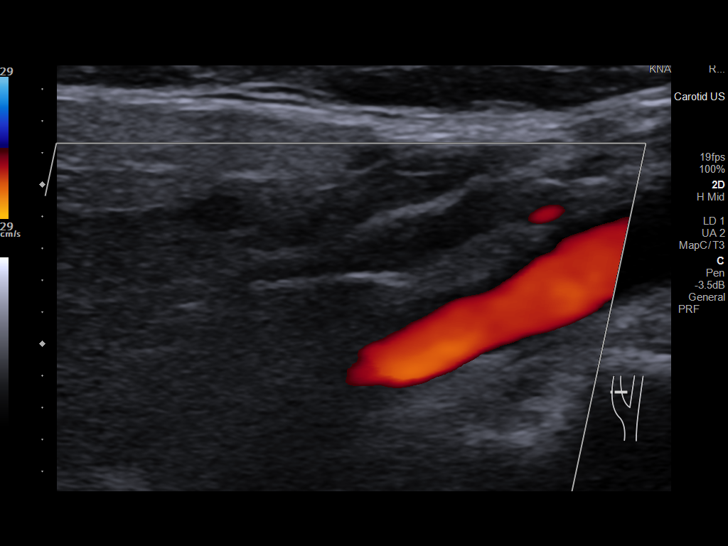
[im 33/68]
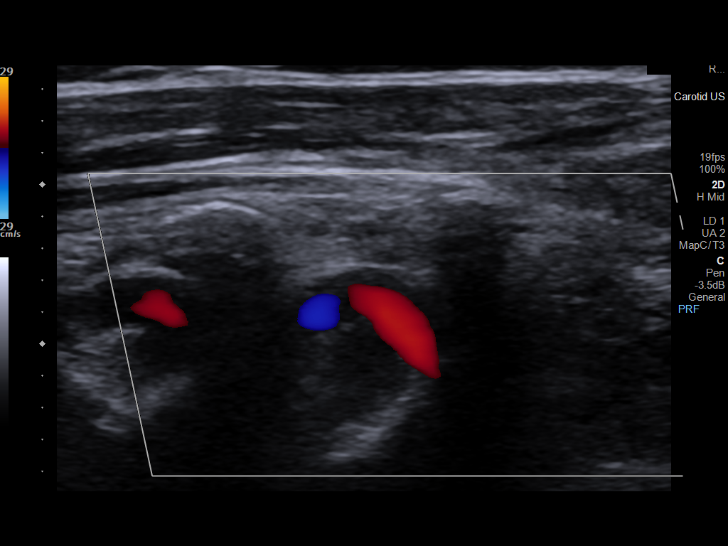
[im 35/68]
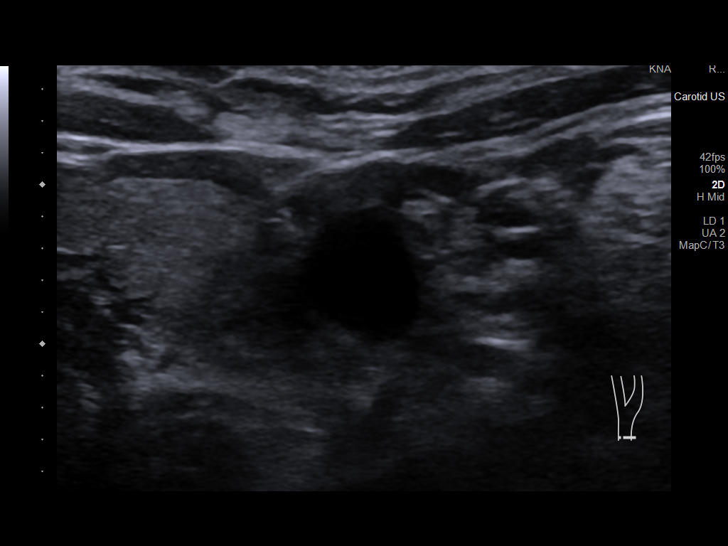
[im 41/68]
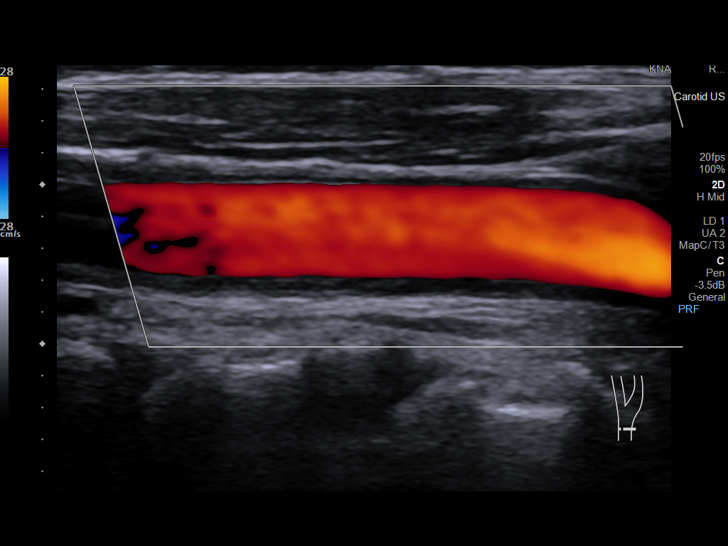
[im 47/68]
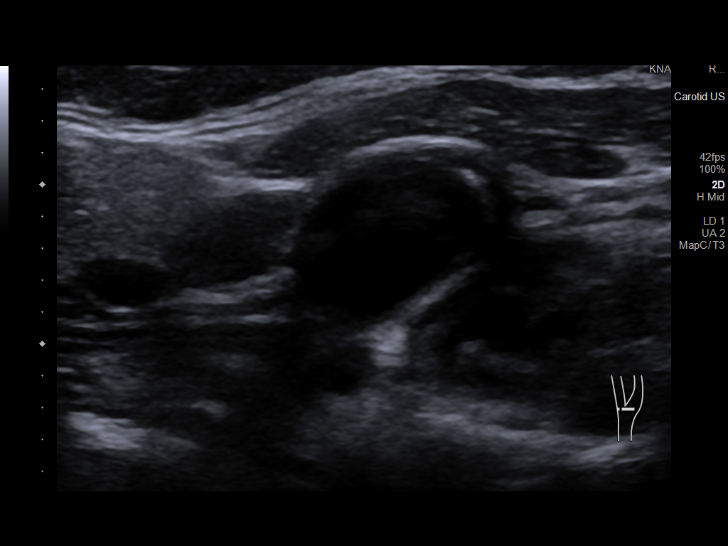
[im 53/68]
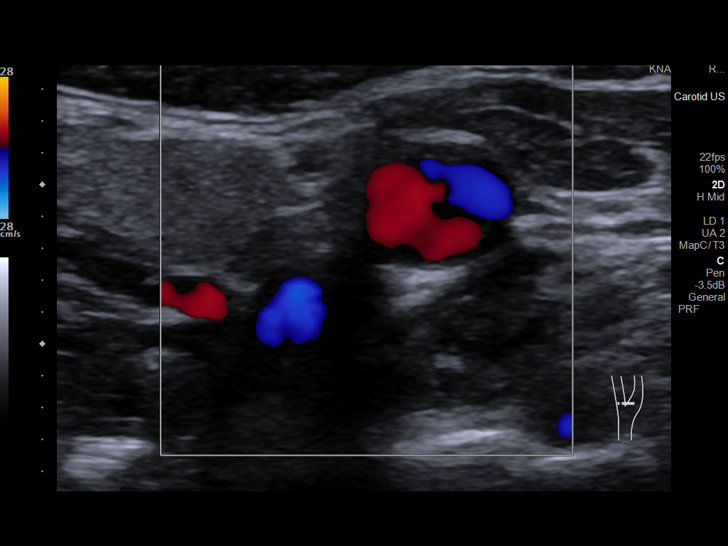
[im 56/68]
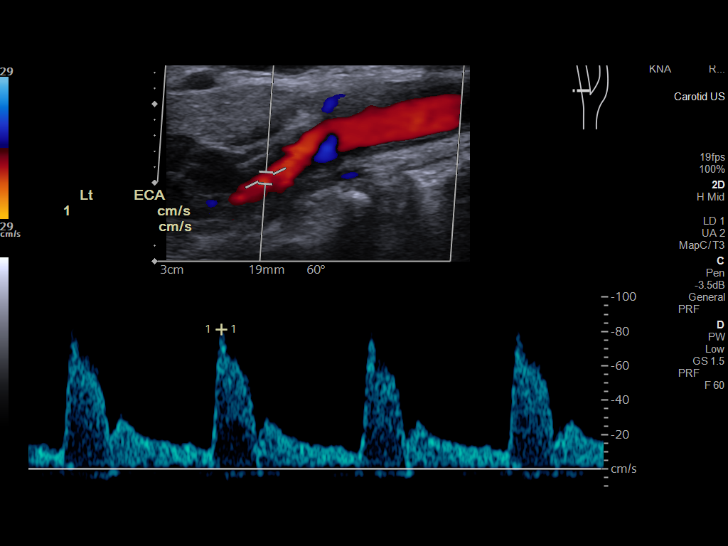
[im 62/68]
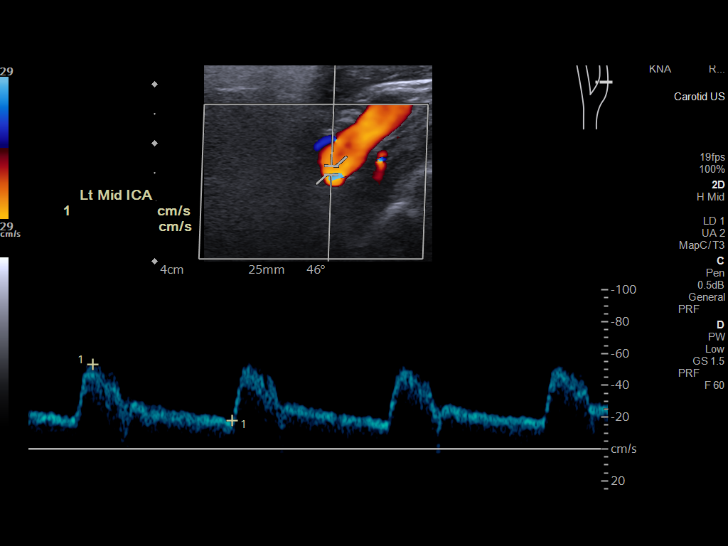
[im 68/68]
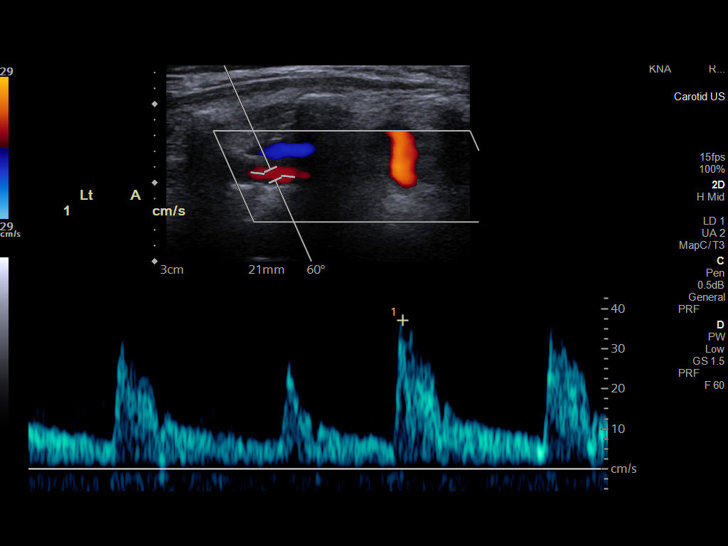

[14 of 24 positions shown; findings below may reference images not displayed]

FINDINGS: Criteria: Quantification of carotid stenosis is based on velocity
parameters that correlate the residual internal carotid diameter
with NASCET-based stenosis levels, using the diameter of the distal
internal carotid lumen as the denominator for stenosis measurement.

The following velocity measurements were obtained:

RIGHT

ICA: 82/30 cm/sec

CCA: 67/16 cm/sec

SYSTOLIC ICA/CCA RATIO:

ECA: 97 cm/sec

LEFT

ICA: 53/18 cm/sec

CCA: 84/18 cm/sec

SYSTOLIC ICA/CCA RATIO:

ECA: 81 cm/sec

RIGHT CAROTID ARTERY: No significant atheromatous plaque.

RIGHT VERTEBRAL ARTERY:  Antegrade flow.

LEFT CAROTID ARTERY:  No significant atheromatous plaque.

LEFT VERTEBRAL ARTERY:  Antegrade flow.
IMPRESSION: No significant stenosis of internal carotid arteries.

## 2021-01-04 MED ORDER — LORATADINE 10 MG PO TABS
10.0000 mg | ORAL_TABLET | Freq: Every day | ORAL | Status: DC
  Administered 2021-01-04 – 2021-01-05 (×2): 10 mg via ORAL
  Filled 2021-01-04 (×2): qty 1

## 2021-01-04 MED ORDER — ALPRAZOLAM 0.5 MG PO TABS
0.5000 mg | ORAL_TABLET | Freq: Three times a day (TID) | ORAL | Status: DC | PRN
  Administered 2021-01-04: 0.5 mg via ORAL
  Filled 2021-01-04: qty 1

## 2021-01-04 MED ORDER — ATORVASTATIN CALCIUM 40 MG PO TABS
80.0000 mg | ORAL_TABLET | Freq: Every day | ORAL | Status: DC
  Administered 2021-01-05: 80 mg via ORAL
  Filled 2021-01-04: qty 2

## 2021-01-04 NOTE — Progress Notes (Signed)
*  PRELIMINARY RESULTS* Echocardiogram 2D Echocardiogram has been performed.  Kimberly Mccormick 01/04/2021, 1:47 PM

## 2021-01-04 NOTE — Progress Notes (Signed)
SLP Cancellation Note  Patient Details Name: Kimberly Mccormick MRN: [id number] DOB: 18-Dec-1950   Cancelled treatment:       Reason Eval/Treat Not Completed: SLP screened, no needs identified, will sign off; SLP screened Pt in room. Pt and son deny changes in swallowing, speech, language, or cognition. MRI does show small acute infarct of right corona  radiata. SLE will be deferred at this time. SLP has Parkinson's disease and may benefit from [facility] Therapy further down the road as an outpatient if clinically indicated. Reconsult if indicated. SLP will sign off.   Thank you,  Genene Churn, [place]  [place] 01/04/2021, 4:36 PM

## 2021-01-04 NOTE — Evaluation (Signed)
Occupational Therapy Evaluation Patient Details Name: Kimberly Mccormick MRN: [id number] DOB: 06/29/1950 Today's Date: 01/04/2021   History of Present Illness Kimberly Mccormick is a 70 y.o. female with medical history significant for left breast cancer (diagnosed on 03/24/2018) s/p lumpectomy (05/06/2018) and adjuvant chemotherapy (06/23/2018) with Adriamycin and Cytoxan x4 followed by Taxol weekly x12, left shoulder pain, GERD, hypertension and Parkinson's disease who presents to the emergency department due to left leg weakness which started on Saturday (12/10) with subsequent abnormal gait.  She Contacted her neurologist at [facility] on the phone today and was asked to go to the ED for further work-up to rule out stroke.  She also complained of left lateral thigh pain and pain underneath left breast which was alleviated with Norco given in the ED.  She complained of left-sided frontal headache, but denies chest pain, shortness of breath, fever, chills, nausea, vomiting, abdominal pain   Clinical Impression   Pt agreeable to PT/OT co-evaluation. Pt demonstrated unsteadiness on feet with good improvement with use of RW. Pt does demonstrate mild to moderate weakness in L UE compared to R in areas of elbow, wrist, and grip mostly. Pt demonstrates possible L superior field peripheral vision deficit but performance was mixed with testing. Pt is not recommended for further acute OT services and will be discharged to care of nursing staff for remaining length of stay.      Recommendations for follow up therapy are one component of a multi-disciplinary discharge planning process, led by the attending physician.  Recommendations may be updated based on patient status, additional functional criteria and insurance authorization.   Follow Up Recommendations  Outpatient OT    Assistance Recommended at Discharge PRN  Functional Status Assessment  Patient has had a recent decline in their functional status and  demonstrates the ability to make significant improvements in function in a reasonable and predictable amount of time.  Equipment Recommendations  None recommended by OT    Recommendations for Other Services  Neuro vision specialist evaluation for possible L upper quadrant deficits.      Precautions / Restrictions Precautions Precautions: Fall Restrictions Weight Bearing Restrictions: No      Mobility Bed Mobility Overal bed mobility: Independent                  Transfers Overall transfer level: Modified independent Equipment used: Rolling walker (2 wheels)               General transfer comment: improved balance with use of RW      Balance   Sitting-balance support: Bilateral upper extremity supported;No upper extremity supported;Feet supported Sitting balance-Leahy Scale: Normal Sitting balance - Comments: seated EOB   Standing balance support: During functional activity;Bilateral upper extremity supported Standing balance-Leahy Scale: Fair (fair to good with RW)                             ADL either performed or assessed with clinical judgement   ADL Overall ADL's : Modified independent;Independent                                             Vision Baseline Vision/History: 1 Wears glasses (for phone use/reading) Ability to See in Adequate Light: 0 Adequate Patient Visual Report: No change from baseline Vision Assessment?: Yes Alignment/Gaze Preference: Within Defined Limits  Tracking/Visual Pursuits: Able to track stimulus in all quads without difficulty Convergence: Within functional limits Visual Fields: Other (comment) (Possible L supior peripheral field deficit. Mixed performance in testing.)     Perception     Praxis      Pertinent Vitals/Pain Pain Assessment: 0-10 Pain Score: 1  Pain Location: L upper trap region Pain Descriptors / Indicators: Throbbing Pain Intervention(s): Limited activity within  patient's tolerance;Monitored during session;Repositioned     Hand Dominance     Extremity/Trunk Assessment Upper Extremity Assessment Upper Extremity Assessment: RUE deficits/detail;LUE deficits/detail RUE Deficits / Details: 4/5 MMT shoulder flexion and abduction. 4+/5 elbow flexion, 5/5 elbow extension, 5/5 wrist flexion and extension. 4+/5 grip. RUE Sensation: WNL RUE Coordination: WNL LUE Deficits / Details: 4/5 MMT shoulder flexion and abduction. 4+/5 elbow flexion, 4-/5 elbow extension, 4/5 wrist flexion and extension. 4/5 grip. LUE Sensation: WNL LUE Coordination: WNL   Lower Extremity Assessment Lower Extremity Assessment: Defer to PT evaluation   Cervical / Trunk Assessment Cervical / Trunk Assessment: Normal   Communication Communication Communication: No difficulties   Cognition Arousal/Alertness: Awake/alert Behavior During Therapy: WFL for tasks assessed/performed Overall Cognitive Status: Within Functional Limits for tasks assessed                                                        Home Living Family/patient expects to be discharged to:: Private residence Living Arrangements: Children Available Help at Discharge: Available 24 hours/day Type of Home: House Home Access: Stairs to enter [employer] of Steps: 3 Entrance Stairs-Rails: Left Home Layout: One level         Bathroom Toilet: Standard Bathroom Accessibility: Yes   Home Equipment: Conservation officer, nature (2 wheels);Cane - quad;BSC/3in1;Grab bars - tub/shower;Grab bars - toilet          Prior Functioning/Environment Prior Level of Function : Independent/Modified Independent             Mobility Comments: Patient states short distance community ambulator without AD ADLs Comments: Independent        OT Problem List: Decreased strength;Impaired balance (sitting and/or standing)            OT Goals(Current goals can be found in the care plan section)  Acute Rehab OT Goals Patient Stated Goal: return home                   Co-evaluation PT/OT/SLP Co-Evaluation/Treatment: Yes Reason for Co-Treatment: Complexity of the patient's impairments (multi-system involvement) PT goals addressed during session: Mobility/safety with mobility;Balance;Proper use of DME;Strengthening/ROM OT goals addressed during session: ADL's and self-care      AM-PAC OT "6 Clicks" Daily Activity     Outcome Measure Help from another person eating meals?: None Help from another person taking care of personal grooming?: None Help from another person toileting, which includes using toliet, bedpan, or urinal?: None Help from another person bathing (including washing, rinsing, drying)?: None Help from another person to put on and taking off regular upper body clothing?: None Help from another person to put on and taking off regular lower body clothing?: None 6 Click Score: 24   End of Session Equipment Utilized During Treatment: Rolling walker (2 wheels)  Activity Tolerance: Patient tolerated treatment well Patient left: in bed;with call bell/phone within reach  OT Visit Diagnosis: Unsteadiness on feet (R26.81);Muscle weakness (  generalized) (M62.81)                Time: 5183-3582 OT Time Calculation (min): 20 min Charges:  OT General Charges $OT Visit: 1 Visit OT Evaluation $OT Eval Low Complexity: 1 Low  Lili Harts OT, MOT  Larey Seat 01/04/2021, 9:57 AM

## 2021-01-04 NOTE — Evaluation (Signed)
Physical Therapy Evaluation Patient Details Name: Kimberly Mccormick MRN: [id number] DOB: [birth year] Today's Date: 01/04/2021  History of Present Illness  Kimberly Mccormick is a 70 y.o. female with medical history significant for left breast cancer (diagnosed on 03/24/2018) s/p lumpectomy (05/06/2018) and adjuvant chemotherapy (06/23/2018) with Adriamycin and Cytoxan x4 followed by Taxol weekly x12, left shoulder pain, GERD, hypertension and Parkinson's disease who presents to the emergency department due to left leg weakness which started on Saturday (12/10) with subsequent abnormal gait.  She Contacted her neurologist at [facility] on the phone today and was asked to go to the ED for further work-up to rule out stroke.  She also complained of left lateral thigh pain and pain underneath left breast which was alleviated with Norco given in the ED.  She complained of left-sided frontal headache, but denies chest pain, shortness of breath, fever, chills, nausea, vomiting, abdominal pain   Clinical Impression  Patient with bilateral LE sensation WNL and slight general BLE weakness but equal bilaterally. Patient does not require assist with bed mobility or transfers but is somewhat unsteady upon standing. Patient attempts ambulation without AD but is unsteady with slow cadence and poor foot clearance. Gait improves minimally with use of quad cane but is greatly improved with use of RW. Patient educated on using RW upon returning home to reduce risk of falls. Patient agreeable to OPPT for gait/balance/strength deficits. Patient discharged to care of nursing for ambulation daily as tolerated for length of stay.         Recommendations for follow up therapy are one component of a multi-disciplinary discharge planning process, led by the attending physician.  Recommendations may be updated based on patient status, additional functional criteria and insurance authorization.  Follow Up Recommendations Outpatient PT     Assistance Recommended at Discharge Intermittent Supervision/Assistance  Functional Status Assessment Patient has had a recent decline in their functional status and demonstrates the ability to make significant improvements in function in a reasonable and predictable amount of time.  Equipment Recommendations  None recommended by PT    Recommendations for Other Services       Precautions / Restrictions Precautions Precautions: Fall Restrictions Weight Bearing Restrictions: No      Mobility  Bed Mobility Overal bed mobility: Independent                  Transfers Overall transfer level: Independent Equipment used: None               General transfer comment: slightly unsteady upon standing without AD    Ambulation/Gait Ambulation/Gait assistance: Min guard Gait Distance (Feet): 200 Feet Assistive device: None;Quad cane;Rolling walker (2 wheels) Gait Pattern/deviations: Knee flexed in stance - left;Step-through pattern Gait velocity: decreased     General Gait Details: Patient with slow, unsteady cadence without AD, gait improves with quad cane but still slow and unsteady, gait greatly improves with use of RW; poor foot clearance throughout, worst without AD  Stairs            Wheelchair Mobility    Modified Rankin (Stroke Patients Only)       Balance Overall balance assessment: Needs assistance Sitting-balance support: No upper extremity supported;Feet supported Sitting balance-Leahy Scale: Normal Sitting balance - Comments: seated EOB   Standing balance support: No upper extremity supported Standing balance-Leahy Scale: Fair  Pertinent Vitals/Pain Pain Assessment: 0-10 Pain Score: 1  Pain Location: L upper trap region Pain Descriptors / Indicators: Throbbing Pain Intervention(s): Limited activity within patient's tolerance;Monitored during session;Repositioned    Home Living Family/patient  expects to be discharged to:: Private residence Living Arrangements: Children Available Help at Discharge: Available 24 hours/day Type of Home: House Home Access: Stairs to enter Entrance Stairs-Rails: Left Entrance Stairs-Number of Steps: 3   Home Layout: One level Home Equipment: Conservation officer, nature (2 wheels);Cane - quad;BSC/3in1;Grab bars - tub/shower;Grab bars - toilet      Prior Function Prior Level of Function : Independent/Modified Independent             Mobility Comments: Patient states short distance community ambulator without AD ADLs Comments: Independent     Hand Dominance        Extremity/Trunk Assessment   Upper Extremity Assessment Upper Extremity Assessment: Defer to OT evaluation    Lower Extremity Assessment Lower Extremity Assessment: Overall WFL for tasks assessed;Generalized weakness    Cervical / Trunk Assessment Cervical / Trunk Assessment: Normal  Communication   Communication: No difficulties  Cognition Arousal/Alertness: Awake/alert Behavior During Therapy: WFL for tasks assessed/performed Overall Cognitive Status: Within Functional Limits for tasks assessed                                          General Comments      Exercises     Assessment/Plan    PT Assessment All further PT needs can be met in the next venue of care  PT Problem List Decreased strength;Decreased activity tolerance;Decreased balance;Decreased mobility       PT Treatment Interventions      PT Goals (Current goals can be found in the Care Plan section)  Acute Rehab PT Goals Patient Stated Goal: Return home PT Goal Formulation: With patient Time For Goal Achievement: 01/04/21 Potential to Achieve Goals: Good    Frequency     Barriers to discharge        Co-evaluation PT/OT/SLP Co-Evaluation/Treatment: Yes Reason for Co-Treatment: Complexity of the patient's impairments (multi-system involvement);To address functional/ADL  transfers PT goals addressed during session: Mobility/safety with mobility;Balance;Proper use of DME;Strengthening/ROM         AM-PAC PT "6 Clicks" Mobility  Outcome Measure Help needed turning from your back to your side while in a flat bed without using bedrails?: None Help needed moving from lying on your back to sitting on the side of a flat bed without using bedrails?: None Help needed moving to and from a bed to a chair (including a wheelchair)?: A Little Help needed standing up from a chair using your arms (e.g., wheelchair or bedside chair)?: A Little Help needed to walk in hospital room?: A Little Help needed climbing 3-5 steps with a railing? : A Lot 6 Click Score: 19    End of Session   Activity Tolerance: Patient tolerated treatment well Patient left: in bed;with call bell/phone within reach Nurse Communication: Mobility status PT Visit Diagnosis: Unsteadiness on feet (R26.81);Other abnormalities of gait and mobility (R26.89);Muscle weakness (generalized) (M62.81)    Time: 4431-5400 PT Time Calculation (min) (ACUTE ONLY): 19 min   Charges:   PT Evaluation $PT Eval Low Complexity: 1 Low          9:25 AM, 01/04/21 Mearl Latin PT, DPT Physical Therapist at [facility]

## 2021-01-04 NOTE — Progress Notes (Signed)
Progress Note    Kimberly Mccormick  LFY:[id number] DOB: [birth year]  DOA: 01/03/2021 PCP: Curlene Labrum, MD    Brief Narrative:     Medical records reviewed and are as summarized below:  Kimberly Mccormick is an 70 y.o. female with medical history significant for left breast cancer (diagnosed on 03/24/2018) s/p lumpectomy (05/06/2018) and adjuvant chemotherapy (06/23/2018) with Adriamycin and Cytoxan x4 followed by Taxol weekly x12, left shoulder pain, GERD, hypertension and Parkinson's disease who presents to the emergency department due to left leg weakness which started on Saturday (12/10) with subsequent abnormal gait.  She Contacted her neurologist at [facility] on the phone today and was asked to go to the ED for further work-up to rule out stroke.  MRI positive for CVA.     Assessment/Plan:   Principal Problem:   Acute ischemic stroke [facility]) Active Problems:   Essential hypertension   GERD (gastroesophageal reflux disease)   Malignant neoplasm of upper-inner quadrant of left breast in female, estrogen receptor positive (HCC)   Hypokalemia   Cerebral aneurysm   Musculoskeletal pain   Acute ischemic stroke MRI of brain of head without contrast showed small acute infarct of the right corona radiata Echocardiogram pending Continue aspirin and statin- LDL 166 Continue fall precautions, aspiration precaution and neuro checks HgbA1c: pending Continue PT/OT - outpatient Tele neurology consult pending   Cerebral aneurysm MRA head without contrast showed 5 mm aneurysm of the supraclinoid left ICA Neurology consulted, we shall await further recommendation Patient may need outpatient follow-up/monitoring with neurosurgery  Hypokalemia -replete   Musculoskeletal pain Continue Norco as needed  Essential hypertension Antihypertensives PRN if Blood pressure is greater than 220/120   GERD Continue Protonix   Parkinson's disease Continue pramipexole  History of left breast  cancer s/p lumpectomy and adjuvant chemotherapy Stable.  Follows with [facility] cancer care [facility] radiation oncology, [place] (Dr. Sharyn Dross) and (Dr. Eustaquio Maize oncology)   On acyclovir BID-- ? Why-- could not find in any notes   Family Communication/Anticipated D/C date and plan/Code Status   DVT prophylaxis: scd Code Status: Full Code.  Family Communication: at bedside Disposition Plan: Status is: Observation  The patient will require care spanning > 2 midnights and should be moved to inpatient because: await tele neurology eval        Medical Consultants:   Neurology    Subjective:   Worried about aneurysm  Objective:    Vitals:   01/04/21 0418 01/04/21 0606 01/04/21 0735 01/04/21 1100  BP: 117/65 117/64 133/86 (!) 142/81  Pulse: (!) 52 (!) 54 (!) 53 (!) 57  Resp: 17 16 16 16   Temp: (!) 97.5 F (36.4 C) 98 F (36.7 C) 98.6 F (37 C) 98.6 F (37 C)  TempSrc: Oral Oral Oral Oral  SpO2: 100% 100% 100% 100%  Weight:      Height:        Intake/Output Summary (Last 24 hours) at 01/04/2021 1306 Last data filed at 01/04/2021 0900 Gross per 24 hour  Intake 720 ml  Output --  Net 720 ml   Filed Weights   01/03/21 1622 01/03/21 2136  Weight: 60.8 kg 60.3 kg    Exam:  General: Appearance:    elderly female in no acute distress     Lungs:     respirations unlabored  Heart:    Bradycardic.   MS:   All extremities are intact.    Neurologic:   Awake, alert     Data Reviewed:  I have personally reviewed following labs and imaging studies:  Labs: Labs show the following:   Basic Metabolic Panel: Recent Labs  Lab 01/03/21 1736 01/04/21 0646  NA 140 142  K 3.4* 4.3  CL 107 108  CO2 25 25  GLUCOSE 120* 102*  BUN 11 14  CREATININE 0.79 0.79  CALCIUM 8.9 9.1  MG  --  2.1  PHOS  --  3.4   GFR Estimated Creatinine Clearance: 54.9 mL/min (by C-G formula based on SCr of 0.79 mg/dL). Liver Function Tests: Recent Labs  Lab 01/04/21 0646   AST 28  ALT 29  ALKPHOS 85  BILITOT 1.2  PROT 6.8  ALBUMIN 3.6   No results for input(s): LIPASE, AMYLASE in the last 168 hours. No results for input(s): AMMONIA in the last 168 hours. Coagulation profile No results for input(s): INR, PROTIME in the last 168 hours.  CBC: Recent Labs  Lab 01/03/21 1736 01/04/21 0646  WBC 5.7 5.6  HGB 12.6 11.9*  HCT 38.8 37.0  MCV 86.2 84.1  PLT 232 226   Cardiac Enzymes: No results for input(s): CKTOTAL, CKMB, CKMBINDEX, TROPONINI in the last 168 hours. BNP (last 3 results) No results for input(s): PROBNP in the last 8760 hours. CBG: No results for input(s): GLUCAP in the last 168 hours. D-Dimer: No results for input(s): DDIMER in the last 72 hours. Hgb A1c: No results for input(s): HGBA1C in the last 72 hours. Lipid Profile: Recent Labs    01/04/21 0646  CHOL 244*  HDL 63  LDLCALC 166*  TRIG 73  CHOLHDL 3.9   Thyroid function studies: No results for input(s): TSH, T4TOTAL, T3FREE, THYROIDAB in the last 72 hours.  Invalid input(s): FREET3 Anemia work up: No results for input(s): VITAMINB12, FOLATE, FERRITIN, TIBC, IRON, RETICCTPCT in the last 72 hours. Sepsis Labs: Recent Labs  Lab 01/03/21 1736 01/04/21 0646  WBC 5.7 5.6    Microbiology No results found for this or any previous visit (from the past 240 hour(s)).  Procedures and diagnostic studies:  MR ANGIO HEAD WO CONTRAST  Result Date: 01/03/2021 CLINICAL DATA:  Ataxia or coordination problem; left lower extremity weakness and dysequilibrium EXAM: MRI HEAD WITHOUT CONTRAST MRA HEAD WITHOUT CONTRAST TECHNIQUE: Multiplanar, multi-echo pulse sequences of the brain and surrounding structures were acquired without intravenous contrast. Angiographic images of the Circle of Willis were acquired using MRA technique without intravenous contrast. COMPARISON:  No pertinent prior exam. FINDINGS: MRI HEAD FINDINGS Brain: Small focus of reduced diffusion in the right corona  radiata. No evidence of intracranial hemorrhage. Patchy foci of T2 hyperintensity in the supratentorial white matter nonspecific but probably reflect mild chronic microvascular ischemic changes. There are chronic small vessel infarcts of the right caudate body and left gangliocapsular region. No intracranial mass or mass effect. No hydrocephalus or extra-axial collection Vascular: Major vessel flow voids at the skull base are preserved. Skull and upper cervical spine: Marrow signal is within normal limits. Sinuses/Orbits: Patchy mucosal thickening. Bilateral lens replacements. Other: Mastoid air cells are clear.  Sella is unremarkable. MRA HEAD FINDINGS Motion artifact is present. Anterior circulation: Intracranial internal carotid arteries are patent with atherosclerotic irregularity. There is a wide neck 4.7 x 3.5 mm medially directed aneurysm from the supraclinoid left ICA. Anterior and middle cerebral arteries are patent. Posterior circulation: Included intracranial vertebral arteries are patent. Basilar artery is patent bilateral posterior communicating arteries are present. Posterior cerebral arteries are patent. IMPRESSION: Small acute infarct of the right corona radiata. Mild chronic microvascular  ischemic changes. Chronic small vessel infarcts. Motion degraded vascular imaging demonstrates no proximal occlusion. There is a 5 mm aneurysm of the supraclinoid left ICA. Electronically Signed   By: Macy Mis M.D.   On: 01/03/2021 18:40   MR BRAIN WO CONTRAST  Result Date: 01/03/2021 CLINICAL DATA:  Ataxia or coordination problem; left lower extremity weakness and dysequilibrium EXAM: MRI HEAD WITHOUT CONTRAST MRA HEAD WITHOUT CONTRAST TECHNIQUE: Multiplanar, multi-echo pulse sequences of the brain and surrounding structures were acquired without intravenous contrast. Angiographic images of the Circle of Willis were acquired using MRA technique without intravenous contrast. COMPARISON:  No pertinent  prior exam. FINDINGS: MRI HEAD FINDINGS Brain: Small focus of reduced diffusion in the right corona radiata. No evidence of intracranial hemorrhage. Patchy foci of T2 hyperintensity in the supratentorial white matter nonspecific but probably reflect mild chronic microvascular ischemic changes. There are chronic small vessel infarcts of the right caudate body and left gangliocapsular region. No intracranial mass or mass effect. No hydrocephalus or extra-axial collection Vascular: Major vessel flow voids at the skull base are preserved. Skull and upper cervical spine: Marrow signal is within normal limits. Sinuses/Orbits: Patchy mucosal thickening. Bilateral lens replacements. Other: Mastoid air cells are clear.  Sella is unremarkable. MRA HEAD FINDINGS Motion artifact is present. Anterior circulation: Intracranial internal carotid arteries are patent with atherosclerotic irregularity. There is a wide neck 4.7 x 3.5 mm medially directed aneurysm from the supraclinoid left ICA. Anterior and middle cerebral arteries are patent. Posterior circulation: Included intracranial vertebral arteries are patent. Basilar artery is patent bilateral posterior communicating arteries are present. Posterior cerebral arteries are patent. IMPRESSION: Small acute infarct of the right corona radiata. Mild chronic microvascular ischemic changes. Chronic small vessel infarcts. Motion degraded vascular imaging demonstrates no proximal occlusion. There is a 5 mm aneurysm of the supraclinoid left ICA. Electronically Signed   By: Macy Mis M.D.   On: 01/03/2021 18:40    Medications:    acyclovir  400 mg Oral BID   aspirin EC  81 mg Oral Daily   atorvastatin  40 mg Oral Daily   cholecalciferol  2,000 Units Oral Daily   ferrous sulfate  325 mg Oral Q breakfast   pantoprazole  80 mg Oral Daily   pramipexole  0.5 mg Oral TID   Continuous Infusions:   LOS: 0 days   Geradine Girt  [facility]   How to contact the [facility]  Attending or Consulting provider [place] or covering provider during after hours [place], for this patient?  Check the care team in [facility] and look for a) attending/consulting [facility] provider listed and b) the [facility] team listed Log into [email or web address] and use [place]'s universal password to access. If you do not have the password, please contact the hospital operator. Locate the [facility] provider you are looking for under [facility] and page to a number that you can be directly reached. If you still have difficulty reaching the provider, please page the [facility] (Director on Call) for the Hospitalists listed on amion for assistance.  01/04/2021, 1:06 PM

## 2021-01-04 NOTE — Consult Note (Addendum)
I connected with  Kimberly Mccormick on 01/04/21 by a video enabled telemedicine application and verified that I am speaking with the correct person using two identifiers.   I discussed the limitations of evaluation and management by telemedicine. The patient expressed understanding and agreed to proceed.  Location of patient: [facility] Location of physician: [facility]  Neurology Consultation Reason for Consult: stroke Referring Physician: Dr Eulogio Bear  CC: stroke  History is obtained from: patient, chart review  HPI: Kimberly Mccormick is a 70 y.o. female with history of Parkinson's disease, left breast cancer status postchemotherapy and radiation who presented to the emergency room due to left leg weakness.  Patient states last known normal was on 12/29/2020 when she went to bed.  She woke up next morning on 12/30/2020 (Saturday) and noticed her left leg was weak.  She initially attributed this to her Parkinson's.  However, when the weakness did not improve, she called her primary neurologist Dr. Rexene Alberts on 12/04/2020 and was told to come to emergency room for evaluation for stroke.   Symptoms started when patient was at home LKN : 12/29/2020  Blood pressure on arrival 156/88 CBG 120  ROS: All other systems reviewed and negative except as noted in the HPI.  Past Medical History:  Diagnosis Date   Anemia    iron def   Breast cancer ([place])    left, Status post chemotherapy and XRT   CHF (congestive heart failure) (HCC)    DDD (degenerative disc disease), lumbar    Essential hypertension    Essential tremor    Genital warts    GERD (gastroesophageal reflux disease)    History of anemia    History of renal insufficiency    Hyperlipidemia    Hypertension    Insomnia    Mixed hyperlipidemia    Osteoarthritis    Otitis media    right   Peripheral neuropathy    d/t chemo   Sinus infection 01/04/14    Family History  Problem Relation Age of Onset   Lupus Mother     Heart attack Father        age 67   Prostate cancer Father    Hypothyroidism Sister    Breast cancer Sister    Hypertension Sister    Hypertension Brother    Parkinson's disease Paternal Aunt     Social History:  reports that she has never smoked. She has never used smokeless tobacco. She reports that she does not drink alcohol and does not use drugs.  Medications Prior to Admission  Medication Sig Dispense Refill Last Dose   acyclovir (ZOVIRAX) 400 MG tablet Take 400 mg by mouth 2 (two) times daily.    01/03/2021   ALPRAZolam (XANAX) 0.5 MG tablet Take 0.5 mg by mouth every 8 (eight) hours as needed for anxiety.   Past Week   cetirizine (ZYRTEC) 10 MG tablet Take 10 mg by mouth daily.   01/03/2021   Cholecalciferol (VITAMIN D) 2000 UNITS tablet Take 2,000 Units by mouth daily.   01/03/2021   ferrous sulfate 325 (65 FE) MG tablet Take 325 mg by mouth daily with breakfast.   01/03/2021   hyoscyamine (LEVSIN SL) 0.125 MG SL tablet Place 1 tablet (0.125 mg total) under the tongue every 6 (six) hours as needed (abdominal pain). 30 tablet 3 unknown   metoprolol succinate (TOPROL-XL) 25 MG 24 hr tablet Take 25 mg by mouth daily.  3 01/03/2021 at 01600   omeprazole (  PRILOSEC) 40 MG capsule Take 1 capsule (40 mg total) by mouth daily. 90 capsule 3 01/03/2021   pramipexole (MIRAPEX) 0.5 MG tablet Take 1 tablet (0.5 mg total) by mouth 3 (three) times daily. 270 tablet 3 01/03/2021   PROCTO-MED HC 2.5 % rectal cream Place rectally 2 (two) times daily as needed.   unknown   zolpidem (AMBIEN) 10 MG tablet Take 5 mg by mouth at bedtime as needed for sleep.   Past Week   benzonatate (TESSALON) 100 MG capsule Take by mouth 3 (three) times daily as needed for cough. (Patient not taking: Reported on 01/03/2021)   Not Taking   Cyanocobalamin 5000 MCG TBDP Take 5,000 mcg by mouth daily. (Patient not taking: Reported on 01/03/2021)   Not Taking   meloxicam (MOBIC) 15 MG tablet Take 15 mg by mouth daily as  needed. (Patient not taking: Reported on 01/03/2021)   Not Taking      Exam: Current vital signs: BP (!) 142/81 (BP Location: Left Arm)    Pulse (!) 57    Temp 98.6 F (37 C) (Oral)    Resp 16    Ht 5\' 4"  (1.626 m)    Wt 60.3 kg    SpO2 100%    BMI 22.82 kg/m  Vital signs in last 24 hours: Temp:  [97.5 F (36.4 C)-98.6 F (37 C)] 98.6 F (37 C) (12/15 1100) Pulse Rate:  [52-80] 57 (12/15 1100) Resp:  [15-17] 16 (12/15 1100) BP: (106-175)/(64-119) 142/81 (12/15 1100) SpO2:  [95 %-100 %] 100 % (12/15 1100) Weight:  [60.3 kg-60.8 kg] 60.3 kg (12/14 2136)   Physical Exam  Constitutional: Appears well-developed and well-nourished.  Psych: Affect appropriate to situation Eyes: No scleral injection Respiratory: Effort normal, non-labored breathing Neuro: Aox3, CN2-12 grossly intact, antigravity strength in all extremities without drift, FTN intact BL  I have reviewed labs in epic and the results pertinent to this consultation are: CBC:  Recent Labs  Lab 01/03/21 1736 01/04/21 0646  WBC 5.7 5.6  HGB 12.6 11.9*  HCT 38.8 37.0  MCV 86.2 84.1  PLT 232 161    Basic Metabolic Panel:  Lab Results  Component Value Date   NA 142 01/04/2021   K 4.3 01/04/2021   CO2 25 01/04/2021   GLUCOSE 102 (H) 01/04/2021   BUN 14 01/04/2021   CREATININE 0.79 01/04/2021   CALCIUM 9.1 01/04/2021   GFRNONAA >60 01/04/2021   GFRAA 80 12/20/2019   Lipid Panel:  Lab Results  Component Value Date   LDLCALC 166 (H) 01/04/2021   HgbA1c: No results found for: HGBA1C Urine Drug Screen: No results found for: LABOPIA, COCAINSCRNUR, LABBENZ, AMPHETMU, THCU, LABBARB  Alcohol Level No results found for: ETH   I have reviewed the images obtained:  MRI brain without contrast 01/03/2021:Small acute infarct of the right corona radiata. Mild chronic microvascular ischemic changes. Chronic small vessel infarcts.  MRA head without contrast 01/03/2021: Motion degraded vascular imaging demonstrates no  proximal occlusion. There is a 5 mm aneurysm of the supraclinoid left ICA.    ASSESSMENT/PLAN: 70 year old female with new onset left leg weakness  Acute ischemic infarct in the right coronary artery Hypertension Hyperlipidemia Left carotid aneurysm -Etiology of stroke: Suspected small vessel disease vs embolic -No tPA as outside window - No thrombectomy as no large vessel occlusion - Carotid US: No atherosclerosis -TTE: No thrombus, no PFO, Left ventricular diastolic parameters are consistent with Grade I diastolic dysfunction (impaired relaxation). - A1c 5.9, LDL 166  Recommendations: -  Start patient on aspirin 81 mg daily and atorvastatin 80 mg daily for secondary stroke prevention - 30 day event monitor for paroxysmal A fib -Recommend management of stroke risk factors including hypertension - Discussed BEFAST with patient -Goal normotension as stroke happened more than few days ago -Recommend follow-up with primary neurologist Dr. Rexene Alberts in 4-8 weeks.   Thank you for allowing [place] to participate in the care of this patient. If you have any further questions, please contact  me or neurohospitalist.   [facility] neuro[facility]ist

## 2021-01-05 MED ORDER — ASPIRIN 81 MG PO TBEC: 81.0000 mg | DELAYED_RELEASE_TABLET | Freq: Every day | ORAL | 11 refills | Status: DC

## 2021-01-05 MED ORDER — DICLOFENAC SODIUM 1 % EX GEL: 2.0000 g | Freq: Four times a day (QID) | CUTANEOUS | Status: DC

## 2021-01-05 MED ORDER — DICLOFENAC SODIUM 1 % EX GEL
2.0000 g | Freq: Four times a day (QID) | CUTANEOUS | Status: DC
  Administered 2021-01-05: 2 g via TOPICAL
  Filled 2021-01-05: qty 100

## 2021-01-05 MED ORDER — ATORVASTATIN CALCIUM 80 MG PO TABS: 80.0000 mg | ORAL_TABLET | Freq: Every day | ORAL | 0 refills | Status: DC

## 2021-01-05 NOTE — Discharge Summary (Signed)
Physician Discharge Summary  KAMRY FARACI CBS:[id number] DOB: 10/20/1950 DOA: 01/03/2021  PCP: Curlene Labrum, MD  Admit date: 01/03/2021 Discharge date: 01/05/2021  Admitted From: home Discharge disposition: home   Recommendations for Outpatient Follow-Up:   ASA plus statin Outpatient neurology follow up for cva and aneurysm Follow LDL And LFTs Outpatient PT/OT Event monitor through cardiology- [place] to arrange   Discharge Diagnosis:   Principal Problem:   Acute ischemic stroke [facility]) Active Problems:   Essential hypertension   GERD (gastroesophageal reflux disease)   Malignant neoplasm of upper-inner quadrant of left breast in female, estrogen receptor positive ([place])   Hypokalemia   Cerebral aneurysm   Musculoskeletal pain   CVA (cerebral vascular accident) [facility])    Discharge Condition: Improved.  Diet recommendation: Low sodium, heart healthy  Wound care: None.  Code status: Full.   History of Present Illness:   IDELLA LAMONTAGNE is a 70 y.o. female with medical history significant for left breast cancer (diagnosed on 03/24/2018) s/p lumpectomy (05/06/2018) and adjuvant chemotherapy (06/23/2018) with Adriamycin and Cytoxan x4 followed by Taxol weekly x12, left shoulder pain, GERD, hypertension and Parkinson's disease who presents to the emergency department due to left leg weakness which started on Saturday (12/10) with subsequent abnormal gait.  She Contacted her neurologist at [facility] on the phone today and was asked to go to the ED for further work-up to rule out stroke.  She also complained of left lateral thigh pain and pain underneath left breast which was alleviated with Norco given in the ED.  She complained of left-sided frontal headache, but denies chest pain, shortness of breath, fever, chills, nausea, vomiting, abdominal pain   Hospital Course by Problem:   Acute ischemic stroke MRI of brain of head without contrast showed small acute  infarct of the right corona radiata Echocardiogram  Continue aspirin and statin- LDL 166 HgbA1c: 5.9 Continue PT/OT - outpatient Tele neurology consult: ASA , statin and event monitor   Cerebral aneurysm MRA head without contrast showed 5 mm aneurysm of the supraclinoid left ICA Neurology consulted- follow up with  primary neurologist Dr. Rexene Alberts in 4-8 weeks.   Hypokalemia -repleted   Musculoskeletal pain Voltaren gel  Essential hypertension Resume home meds  GERD Continue Protonix   Parkinson's disease Continue pramipexole  History of left breast cancer s/p lumpectomy and adjuvant chemotherapy Stable.  Follows with [facility] cancer care [facility] radiation oncology, [place] (Dr. Sharyn Dross) and (Dr. Eustaquio Maize oncology)      Medical Consultants:   neurology   Discharge Exam:   Vitals:   01/05/21 0333 01/05/21 0621  BP: 127/74 130/73  Pulse: 61 60  Resp: 18 18  Temp: 97.8 F (36.6 C) 97.7 F (36.5 C)  SpO2: 100% 100%   Vitals:   01/04/21 1930 01/04/21 2117 01/05/21 0333 01/05/21 0621  BP: (!) 150/84 137/74 127/74 130/73  Pulse: 69 (!) 59 61 60  Resp: 16 18 18 18   Temp: 98.2 F (36.8 C) 97.9 F (36.6 C) 97.8 F (36.6 C) 97.7 F (36.5 C)  TempSrc: Oral Oral Oral Oral  SpO2:  98% 100% 100%  Weight:      Height:        General exam: Appears calm and comfortable. .    The results of significant diagnostics from this hospitalization (including imaging, microbiology, ancillary and laboratory) are listed below for reference.     Procedures and Diagnostic Studies:   MR ANGIO HEAD WO CONTRAST  Result Date:  01/03/2021 CLINICAL DATA:  Ataxia or coordination problem; left lower extremity weakness and dysequilibrium EXAM: MRI HEAD WITHOUT CONTRAST MRA HEAD WITHOUT CONTRAST TECHNIQUE: Multiplanar, multi-echo pulse sequences of the brain and surrounding structures were acquired without intravenous contrast. Angiographic images of the Circle of Willis were  acquired using MRA technique without intravenous contrast. COMPARISON:  No pertinent prior exam. FINDINGS: MRI HEAD FINDINGS Brain: Small focus of reduced diffusion in the right corona radiata. No evidence of intracranial hemorrhage. Patchy foci of T2 hyperintensity in the supratentorial white matter nonspecific but probably reflect mild chronic microvascular ischemic changes. There are chronic small vessel infarcts of the right caudate body and left gangliocapsular region. No intracranial mass or mass effect. No hydrocephalus or extra-axial collection Vascular: Major vessel flow voids at the skull base are preserved. Skull and upper cervical spine: Marrow signal is within normal limits. Sinuses/Orbits: Patchy mucosal thickening. Bilateral lens replacements. Other: Mastoid air cells are clear.  Sella is unremarkable. MRA HEAD FINDINGS Motion artifact is present. Anterior circulation: Intracranial internal carotid arteries are patent with atherosclerotic irregularity. There is a wide neck 4.7 x 3.5 mm medially directed aneurysm from the supraclinoid left ICA. Anterior and middle cerebral arteries are patent. Posterior circulation: Included intracranial vertebral arteries are patent. Basilar artery is patent bilateral posterior communicating arteries are present. Posterior cerebral arteries are patent. IMPRESSION: Small acute infarct of the right corona radiata. Mild chronic microvascular ischemic changes. Chronic small vessel infarcts. Motion degraded vascular imaging demonstrates no proximal occlusion. There is a 5 mm aneurysm of the supraclinoid left ICA. Electronically Signed   By: Macy Mis M.D.   On: 01/03/2021 18:40   MR BRAIN WO CONTRAST  Result Date: 01/03/2021 CLINICAL DATA:  Ataxia or coordination problem; left lower extremity weakness and dysequilibrium EXAM: MRI HEAD WITHOUT CONTRAST MRA HEAD WITHOUT CONTRAST TECHNIQUE: Multiplanar, multi-echo pulse sequences of the brain and surrounding  structures were acquired without intravenous contrast. Angiographic images of the Circle of Willis were acquired using MRA technique without intravenous contrast. COMPARISON:  No pertinent prior exam. FINDINGS: MRI HEAD FINDINGS Brain: Small focus of reduced diffusion in the right corona radiata. No evidence of intracranial hemorrhage. Patchy foci of T2 hyperintensity in the supratentorial white matter nonspecific but probably reflect mild chronic microvascular ischemic changes. There are chronic small vessel infarcts of the right caudate body and left gangliocapsular region. No intracranial mass or mass effect. No hydrocephalus or extra-axial collection Vascular: Major vessel flow voids at the skull base are preserved. Skull and upper cervical spine: Marrow signal is within normal limits. Sinuses/Orbits: Patchy mucosal thickening. Bilateral lens replacements. Other: Mastoid air cells are clear.  Sella is unremarkable. MRA HEAD FINDINGS Motion artifact is present. Anterior circulation: Intracranial internal carotid arteries are patent with atherosclerotic irregularity. There is a wide neck 4.7 x 3.5 mm medially directed aneurysm from the supraclinoid left ICA. Anterior and middle cerebral arteries are patent. Posterior circulation: Included intracranial vertebral arteries are patent. Basilar artery is patent bilateral posterior communicating arteries are present. Posterior cerebral arteries are patent. IMPRESSION: Small acute infarct of the right corona radiata. Mild chronic microvascular ischemic changes. Chronic small vessel infarcts. Motion degraded vascular imaging demonstrates no proximal occlusion. There is a 5 mm aneurysm of the supraclinoid left ICA. Electronically Signed   By: Macy Mis M.D.   On: 01/03/2021 18:40   US Carotid Bilateral  Result Date: 01/04/2021 CLINICAL DATA:  Stroke Hypertension Hyperlipidemia EXAM: BILATERAL CAROTID DUPLEX ULTRASOUND TECHNIQUE: Pearline Cables scale imaging, color Doppler  and duplex  ultrasound were performed of bilateral carotid and vertebral arteries in the neck. COMPARISON:  None. FINDINGS: Criteria: Quantification of carotid stenosis is based on velocity parameters that correlate the residual internal carotid diameter with NASCET-based stenosis levels, using the diameter of the distal internal carotid lumen as the denominator for stenosis measurement. The following velocity measurements were obtained: RIGHT ICA: 82/30 cm/sec CCA: 48/18 cm/sec SYSTOLIC ICA/CCA RATIO:  1.2 ECA: 97 cm/sec LEFT ICA: 53/18 cm/sec CCA: 56/31 cm/sec SYSTOLIC ICA/CCA RATIO:  0.6 ECA: 81 cm/sec RIGHT CAROTID ARTERY: No significant atheromatous plaque. RIGHT VERTEBRAL ARTERY:  Antegrade flow. LEFT CAROTID ARTERY:  No significant atheromatous plaque. LEFT VERTEBRAL ARTERY:  Antegrade flow. IMPRESSION: No significant stenosis of internal carotid arteries. Electronically Signed   By: Miachel Roux M.D.   On: 01/04/2021 14:39   ECHOCARDIOGRAM COMPLETE  Result Date: 01/04/2021    ECHOCARDIOGRAM REPORT   Patient Name:   KELSAY HAGGARD Date of Exam: 01/04/2021 Medical Rec #:  [id number]      Height:       64.0 in Accession #:    [id number]     Weight:       132.9 lb Date of Birth:  1950/12/12      BSA:          1.645 m Patient Age:    31 years       BP:           133/86 mmHg Patient Gender: F              HR:           53 bpm. Exam Location:  [facility] Procedure: 2D Echo, Cardiac Doppler and Color Doppler Indications:    Stroke  History:        Patient has no prior history of Echocardiogram examinations.                 Stroke; Risk Factors:Hypertension and Dyslipidemia.  Sonographer:    Wenda Low Referring Phys: [id number] OLADAPO ADEFESO IMPRESSIONS  1. Left ventricular ejection fraction, by estimation, is 55 to 60%. The left ventricle has normal function. The left ventricle has no regional wall motion abnormalities. There is moderate asymmetric left ventricular hypertrophy of the basal segment. Left  ventricular diastolic parameters are consistent with Grade I diastolic dysfunction (impaired relaxation).  2. Right ventricular systolic function is normal. The right ventricular size is normal. There is normal pulmonary artery systolic pressure. The estimated right ventricular systolic pressure is 72.0 mmHg.  3. The mitral valve is grossly normal. Trivial mitral valve regurgitation.  4. The aortic valve is tricuspid. Aortic valve regurgitation is not visualized. Aortic valve mean gradient measures 4.0 mmHg.  5. The inferior vena cava is normal in size with greater than 50% respiratory variability, suggesting right atrial pressure of 3 mmHg. Comparison(s): No prior Echocardiogram. FINDINGS  Left Ventricle: Left ventricular ejection fraction, by estimation, is 55 to 60%. The left ventricle has normal function. The left ventricle has no regional wall motion abnormalities. The left ventricular internal cavity size was normal in size. There is  moderate asymmetric left ventricular hypertrophy of the basal segment. Left ventricular diastolic parameters are consistent with Grade I diastolic dysfunction (impaired relaxation). Right Ventricle: The right ventricular size is normal. No increase in right ventricular wall thickness. Right ventricular systolic function is normal. There is normal pulmonary artery systolic pressure. The tricuspid regurgitant velocity is 2.29 m/s, and  with an assumed right atrial pressure of 3 mmHg, the estimated  right ventricular systolic pressure is 37.8 mmHg. Left Atrium: Left atrial size was normal in size. Right Atrium: Right atrial size was normal in size. Pericardium: There is no evidence of pericardial effusion. Mitral Valve: The mitral valve is grossly normal. Trivial mitral valve regurgitation. MV peak gradient, 3.9 mmHg. The mean mitral valve gradient is 1.0 mmHg. Tricuspid Valve: The tricuspid valve is grossly normal. Tricuspid valve regurgitation is mild. Aortic Valve: The aortic  valve is tricuspid. Aortic valve regurgitation is not visualized. Aortic valve mean gradient measures 4.0 mmHg. Aortic valve peak gradient measures 7.3 mmHg. Aortic valve area, by VTI measures 2.19 cm. Pulmonic Valve: The pulmonic valve was grossly normal. Pulmonic valve regurgitation is trivial. Aorta: The aortic root is normal in size and structure. Venous: The inferior vena cava is normal in size with greater than 50% respiratory variability, suggesting right atrial pressure of 3 mmHg. IAS/Shunts: No atrial level shunt detected by color flow Doppler.  LEFT VENTRICLE PLAX 2D LVIDd:         4.10 cm     Diastology LVIDs:         2.80 cm     LV e' medial:    5.22 cm/s LV PW:         0.80 cm     LV E/e' medial:  12.1 LV IVS:        1.30 cm     LV e' lateral:   7.62 cm/s LVOT diam:     1.90 cm     LV E/e' lateral: 8.3 LV SV:         61 LV SV Index:   37 LVOT Area:     2.84 cm  LV Volumes (MOD) LV vol d, MOD A2C: 51.6 ml LV vol d, MOD A4C: 37.4 ml LV vol s, MOD A2C: 16.2 ml LV vol s, MOD A4C: 18.6 ml LV SV MOD A2C:     35.4 ml LV SV MOD A4C:     37.4 ml LV SV MOD BP:      26.4 ml RIGHT VENTRICLE RV Basal diam:  3.10 cm RV Mid diam:    2.60 cm RV S prime:     11.70 cm/s TAPSE (M-mode): 2.0 cm LEFT ATRIUM             Index        RIGHT ATRIUM           Index LA diam:        3.60 cm 2.19 cm/m   RA Area:     13.20 cm LA Vol (A2C):   52.3 ml 31.80 ml/m  RA Volume:   33.30 ml  20.25 ml/m LA Vol (A4C):   46.2 ml 28.09 ml/m LA Biplane Vol: 48.4 ml 29.43 ml/m  AORTIC VALVE                    PULMONIC VALVE AV Area (Vmax):    2.18 cm     PV Vmax:       0.76 m/s AV Area (Vmean):   1.99 cm     PV Peak grad:  2.3 mmHg AV Area (VTI):     2.19 cm AV Vmax:           135.00 cm/s AV Vmean:          86.700 cm/s AV VTI:            0.280 m AV Peak Grad:      7.3 mmHg AV  Mean Grad:      4.0 mmHg LVOT Vmax:         104.00 cm/s LVOT Vmean:        60.900 cm/s LVOT VTI:          0.216 m LVOT/AV VTI ratio: 0.77  AORTA Ao Root diam:  2.70 cm Ao Asc diam:  2.70 cm MITRAL VALVE               TRICUSPID VALVE MV Area (PHT): 2.50 cm    TR Peak grad:   21.0 mmHg MV Area VTI:   2.23 cm    TR Vmax:        229.00 cm/s MV Peak grad:  3.9 mmHg MV Mean grad:  1.0 mmHg    SHUNTS MV Vmax:       0.98 m/s    Systemic VTI:  0.22 m MV Vmean:      49.3 cm/s   Systemic Diam: 1.90 cm MV Decel Time: 304 msec MV E velocity: 63.40 cm/s MV A velocity: 92.10 cm/s MV E/A ratio:  0.69 Rozann Lesches MD Electronically signed by Rozann Lesches MD Signature Date/Time: 01/04/2021/1:57:07 PM    Final      Labs:   Basic Metabolic Panel: Recent Labs  Lab 01/03/21 1736 01/04/21 0646  NA 140 142  K 3.4* 4.3  CL 107 108  CO2 25 25  GLUCOSE 120* 102*  BUN 11 14  CREATININE 0.79 0.79  CALCIUM 8.9 9.1  MG  --  2.1  PHOS  --  3.4   GFR Estimated Creatinine Clearance: 54.9 mL/min (by C-G formula based on SCr of 0.79 mg/dL). Liver Function Tests: Recent Labs  Lab 01/04/21 0646  AST 28  ALT 29  ALKPHOS 85  BILITOT 1.2  PROT 6.8  ALBUMIN 3.6   No results for input(s): LIPASE, AMYLASE in the last 168 hours. No results for input(s): AMMONIA in the last 168 hours. Coagulation profile No results for input(s): INR, PROTIME in the last 168 hours.  CBC: Recent Labs  Lab 01/03/21 1736 01/04/21 0646  WBC 5.7 5.6  HGB 12.6 11.9*  HCT 38.8 37.0  MCV 86.2 84.1  PLT 232 226   Cardiac Enzymes: No results for input(s): CKTOTAL, CKMB, CKMBINDEX, TROPONINI in the last 168 hours. BNP: Invalid input(s): POCBNP CBG: No results for input(s): GLUCAP in the last 168 hours. D-Dimer No results for input(s): DDIMER in the last 72 hours. Hgb A1c Recent Labs    01/04/21 0646  HGBA1C 5.9*   Lipid Profile Recent Labs    01/04/21 0646  CHOL 244*  HDL 63  LDLCALC 166*  TRIG 73  CHOLHDL 3.9   Thyroid function studies No results for input(s): TSH, T4TOTAL, T3FREE, THYROIDAB in the last 72 hours.  Invalid input(s): FREET3 Anemia work up No  results for input(s): VITAMINB12, FOLATE, FERRITIN, TIBC, IRON, RETICCTPCT in the last 72 hours. Microbiology No results found for this or any previous visit (from the past 240 hour(s)).   Discharge Instructions:   Discharge Instructions     Diet - low sodium heart healthy   Complete by: As directed    Discharge instructions   Complete by: As directed    Outpatient PT/OT Heart Event monitor- your cardiologist will send this to your house with instructions of how to wear Your cholesterol is high so statin was prescribed   Increase activity slowly   Complete by: As directed       Allergies as of 01/05/2021  Reactions   Eggs Or Egg-derived Products Nausea And Vomiting   Other Nausea And Vomiting   Dairy products\   Penicillins Hives   Has patient had a PCN reaction causing immediate rash, facial/tongue/throat swelling, SOB or lightheadedness with hypotension: No Has patient had a PCN reaction causing severe rash involving mucus membranes or skin necrosis: No Has patient had a PCN reaction that required hospitalization: No Has patient had a PCN reaction occurring within the last 10 years: No If all of the above answers are "NO", then may proceed with Cephalosporin use.        Medication List     STOP taking these medications    benzonatate 100 MG capsule Commonly known as: TESSALON   Cyanocobalamin 5000 MCG Tbdp   meloxicam 15 MG tablet Commonly known as: MOBIC       TAKE these medications    acyclovir 400 MG tablet Commonly known as: ZOVIRAX Take 400 mg by mouth 2 (two) times daily.   ALPRAZolam 0.5 MG tablet Commonly known as: XANAX Take 0.5 mg by mouth every 8 (eight) hours as needed for anxiety.   aspirin 81 MG EC tablet Take 1 tablet (81 mg total) by mouth daily. Swallow whole.   atorvastatin 80 MG tablet Commonly known as: LIPITOR Take 1 tablet (80 mg total) by mouth daily.   cetirizine 10 MG tablet Commonly known as: ZYRTEC Take 10 mg by  mouth daily.   diclofenac Sodium 1 % Gel Commonly known as: VOLTAREN Apply 2 g topically 4 (four) times daily.   ferrous sulfate 325 (65 FE) MG tablet Take 325 mg by mouth daily with breakfast.   hyoscyamine 0.125 MG SL tablet Commonly known as: LEVSIN SL Place 1 tablet (0.125 mg total) under the tongue every 6 (six) hours as needed (abdominal pain).   metoprolol succinate 25 MG 24 hr tablet Commonly known as: TOPROL-XL Take 25 mg by mouth daily.   omeprazole 40 MG capsule Commonly known as: PRILOSEC Take 1 capsule (40 mg total) by mouth daily.   pramipexole 0.5 MG tablet Commonly known as: Mirapex Take 1 tablet (0.5 mg total) by mouth 3 (three) times daily.   Procto-Med HC 2.5 % rectal cream Generic drug: hydrocortisone Place rectally 2 (two) times daily as needed.   Vitamin D 50 MCG (2000 UT) tablet Take 2,000 Units by mouth daily.   zolpidem 10 MG tablet Commonly known as: AMBIEN Take 5 mg by mouth at bedtime as needed for sleep.        Follow-up Information     Burdine, Virgina Evener, MD Follow up in 1 week(s).   Specialty: Family Medicine Contact information: [place] [address] [phone]         Satira Sark, MD Follow up.   Specialty: Cardiology Why: will send you your event monitor to look for atrial fibrillation Contact information: [address] [place] [address] [phone]         Star Age, MD Follow up.   Specialties: Neurology, Radiology Why: 6 weeks CVA follow up Contact information: [address] [place] [address] [phone]                  Time coordinating discharge: 35 min  Signed:  Geradine Girt DO  [facility] 01/05/2021, 8:43 AM

## 2021-01-05 NOTE — TOC Transition Note (Signed)
Transition of Care [facility]) - CM/SW Discharge Note   Patient Details  Name: Kimberly Mccormick MRN: [id number] Date of Birth: 1950-08-15  Transition of Care [facility]) CM/SW Contact:  Natasha Bence, LCSW Phone Number: 01/05/2021, 12:36 PM   Clinical Narrative:    CSW notified of patient's readiness for discharge. Patient agreeable to OPPT in at [facility] clinic. CSW refferred patient to AP OPPT. TOC signing off.   Final next level of care: OP Rehab Barriers to Discharge: Barriers Resolved   Patient Goals and CMS Choice Patient states their goals for this hospitalization and ongoing recovery are:: OPPT CMS Medicare.gov Compare Post Acute Care list provided to:: Patient Choice offered to / list presented to : Patient  Discharge Placement                    Patient and family notified of of transfer: 01/05/21  Discharge Plan and Services                                     Social Determinants of Health (SDOH) Interventions     Readmission Risk Interventions No flowsheet data found.

## 2021-01-05 NOTE — Care Management Important Message (Signed)
Important Message  Patient Details  Name: Kimberly Mccormick MRN: [id number] Date of Birth: 1950/12/28   Medicare Important Message Given:  Yes     Tommy Medal 01/05/2021, 11:58 AM

## 2021-01-05 NOTE — Progress Notes (Signed)
Patient complaining of pain to her left shoulder, provided prn pain medication.

## 2021-01-08 ENCOUNTER — Other Ambulatory Visit: Payer: Self-pay

## 2021-01-08 ENCOUNTER — Encounter: Payer: Self-pay | Admitting: Adult Health

## 2021-01-08 ENCOUNTER — Ambulatory Visit (INDEPENDENT_AMBULATORY_CARE_PROVIDER_SITE_OTHER): Payer: Medicare Other | Admitting: Adult Health

## 2021-01-08 ENCOUNTER — Other Ambulatory Visit (HOSPITAL_COMMUNITY)
Admission: RE | Admit: 2021-01-08 | Discharge: 2021-01-08 | Disposition: A | Discharge status: Home / Self Care | Payer: Medicare Other | Source: Ambulatory Visit | Attending: Adult Health | Admitting: Adult Health

## 2021-01-08 VITALS — BP 147/88 | HR 65 | Ht 62.75 in | Wt 134.0 lb

## 2021-01-08 DIAGNOSIS — Z8619 Personal history of other infectious and parasitic diseases: Secondary | ICD-10-CM | POA: Diagnosis not present

## 2021-01-08 DIAGNOSIS — Z01419 Encounter for gynecological examination (general) (routine) without abnormal findings: Secondary | ICD-10-CM | POA: Diagnosis not present

## 2021-01-08 DIAGNOSIS — Z1151 Encounter for screening for human papillomavirus (HPV): Secondary | ICD-10-CM | POA: Diagnosis not present

## 2021-01-08 NOTE — Progress Notes (Addendum)
°  Subjective:     Patient ID: Kimberly Mccormick, female   DOB: 1950-08-13, 70 y.o.   MRN: [id number]  HPI Kimberly Mccormick is a 70 year old black female, separated, PM in wanting to know about her having herpes and warts. She is on acyclovir and took OTC meds she ordered on Internet. She has had recent stroke. She is wanting to have sex again it has been about 15 years. She is using a cane today.  Will perform pap at her request. Has history of breast cancer.  PCP is [facility].    Review of Systems No vaginal discharge or bleeding No current out break of herpes or warts No sex in about 15 years Denies any problems with urination or bowel movements Reviewed past medical,surgical, social and family history. Reviewed medications and allergies.     Objective:   Physical Exam BP (!) 147/88 (BP Location: Left Arm, Patient Position: Sitting, Cuff Size: Normal)    Pulse 65    Ht 5' 2.75" (1.594 m)    Wt 134 lb (60.8 kg)    BMI 23.93 kg/m     Skin warm and dry.No oral lesions noted, has dentures. Pelvic: external genitalia is normal in appearance no lesions, vagina: pale with white discharge, no odor,urethra has no lesions or masses noted, cervix:smooth,pap with HR HPV genotyping performed, uterus: normal size, shape and contour, non tender, no masses felt, adnexa: no masses or tenderness noted. Bladder is non tender and no masses felt.  AA is 0 Fall risk is low Depression screen PHQ 2/9 01/08/2021  Decreased Interest 0  Down, Depressed, Hopeless 0  PHQ - 2 Score 0  Altered sleeping 0  Tired, decreased energy 0  Change in appetite 0  Feeling bad or failure about yourself  0  Trouble concentrating 0  Moving slowly or fidgety/restless 0  Suicidal thoughts 0  PHQ-9 Score 0    GAD 7 : Generalized Anxiety Score 01/08/2021  Nervous, Anxious, on Edge 0  Control/stop worrying 0  Worry too much - different things 0  Trouble relaxing 0  Restless 0  Easily annoyed or irritable 0  Afraid - awful might happen  0  Total GAD 7 Score 0    Upstream - 01/08/21 1355       Pregnancy Intention Screening   Does the patient want to become pregnant in the next year? No    Does the patient's partner want to become pregnant in the next year? No    Would the patient like to discuss contraceptive options today? No      Contraception Wrap Up   Current Method No Method - Other Reason   postmenopausal   End Method No Method - Other Reason   postmenopausal   Contraception Counseling Provided No            Examination chaperoned by Levy Pupa LPN    Assessment:    1. Papanicolaou smear, as part of routine gynecological examination Pap sent with HR HPV genotyping   2. History of herpes simplex infection Continue acyclovir and if has sex use condoms Review handout by Tilden Fossa on herpes    Tried to explain herpes virus to her  Plan:    Follow up prn

## 2021-01-09 ENCOUNTER — Ambulatory Visit (HOSPITAL_COMMUNITY): Payer: Medicare Other | Attending: Internal Medicine | Admitting: Physical Therapy

## 2021-01-09 DIAGNOSIS — R2681 Unsteadiness on feet: Secondary | ICD-10-CM | POA: Diagnosis not present

## 2021-01-09 DIAGNOSIS — M6281 Muscle weakness (generalized): Secondary | ICD-10-CM | POA: Insufficient documentation

## 2021-01-09 DIAGNOSIS — I639 Cerebral infarction, unspecified: Secondary | ICD-10-CM | POA: Diagnosis not present

## 2021-01-09 NOTE — Therapy (Signed)
Kimberly Mccormick [address] [place], [place], [address] Phone: [phone]   Fax:  [phone]  Physical Therapy Evaluation  Patient Details  Name: Kimberly Mccormick MRN: [id number] Date of Birth: Apr 16, 1950 Referring Provider (PT): Eulogio Bear   Encounter Date: 01/09/2021   PT End of Session - 01/09/21 1009     Visit Number 1    Number of Visits 12    Date for PT Re-Evaluation 02/20/21    Authorization Type UHC medicare    Progress Note Due on Visit 10    PT Start Time 0915    PT Stop Time 1000    PT Time Calculation (min) 45 min    Activity Tolerance Patient tolerated treatment well    Behavior During Therapy [facility] for tasks assessed/performed             Past Medical History:  Diagnosis Date   Anemia    iron def   Aneurysm ([place])    Breast cancer ([place])    left, Status post chemotherapy and XRT   CHF (congestive heart failure) ([place])    DDD (degenerative disc disease), lumbar    Essential hypertension    Essential tremor    Genital warts    GERD (gastroesophageal reflux disease)    History of anemia    History of renal insufficiency    Hyperlipidemia    Hypertension    Insomnia    Mixed hyperlipidemia    Osteoarthritis    Otitis media    right   Parkinson's disease ([place])    Peripheral neuropathy    d/t chemo   Sinus infection 01/04/2014   Stroke [facility])     Past Surgical History:  Procedure Laterality Date   APPENDECTOMY  1966   BIOPSY  08/29/2020   Procedure: BIOPSY;  Surgeon: Harvel Quale, MD;  [facility];  Service: Gastroenterology;;   BREAST LUMPECTOMY WITH RADIOACTIVE SEED AND SENTINEL LYMPH NODE BIOPSY Left 05/06/2018   Procedure: LEFT BREAST LUMPECTOMY WITH RADIOACTIVE SEED AND LEFT DEEP AXILLARY SENTINEL LYMPH NODE BIOPSY AND BLUE DYE INJECTION;  Surgeon: Fanny Skates, MD;  Location: [place];  Service: General;  Laterality: Left;   CATARACT EXTRACTION W/PHACO  Left 01/10/2014   Procedure: CATARACT EXTRACTION PHACO AND INTRAOCULAR LENS PLACEMENT ; CDE:  4.94;  Surgeon: Williams Che, MD;  Location: [facility];  Service: Ophthalmology;  Laterality: Left;   CESAREAN SECTION  1986   CHOLECYSTECTOMY     COLONOSCOPY WITH PROPOFOL N/A 07/25/2020   Procedure: COLONOSCOPY WITH PROPOFOL;  Surgeon: Harvel Quale, MD;  [facility];  Service: Gastroenterology;  Laterality: N/A;  10:55   ESOPHAGOGASTRODUODENOSCOPY (EGD) WITH PROPOFOL N/A 08/29/2020   Procedure: ESOPHAGOGASTRODUODENOSCOPY (EGD) WITH PROPOFOL;  Surgeon: Harvel Quale, MD;  [facility];  Service: Gastroenterology;  Laterality: N/A;  12:00   MYRINGOTOMY WITH TUBE PLACEMENT Right 02/04/2017   Procedure: REVISION OF RIGHT MYRINGOTOMY WITH TUBE PLACEMENT, WITH EXAM OF LEFT EAR;  Surgeon: Leta Baptist, MD;  Location: [place];  Service: ENT;  Laterality: Right;   NASAL SINUS SURGERY  2016   polypectomy   POLYPECTOMY  07/25/2020   Procedure: POLYPECTOMY;  Surgeon: Harvel Quale, MD;  [facility];  Service: Gastroenterology;;   POLYPECTOMY  08/29/2020   Procedure: POLYPECTOMY;  Surgeon: Harvel Quale, MD;  [facility];  Service: Gastroenterology;;   [facility] REMOVAL N/A 01/06/2019   Procedure: REMOVAL PORT-A-CATH;  Surgeon: Fanny Skates, MD;  Location: [place];  Service: General;  Laterality: N/A;   PORTACATH PLACEMENT Right 06/16/2018   Procedure: INSERTION PORT-A-CATH;  Surgeon: Fanny Skates, MD;  Location: [place];  Service: General;  Laterality: Right;   SUBMUCOSAL LIFTING INJECTION  08/29/2020   Procedure: SUBMUCOSAL LIFTING INJECTION;  Surgeon: Harvel Quale, MD;  [facility];  Service: Gastroenterology;;   TONSILLECTOMY  1970    There were no vitals filed for this visit.    Subjective Assessment - 01/09/21 1497     Subjective [name] was Dx with CVA on 12/14, she was discharged on 12/16 and is currently being referred to skilled PT.  [name] states that she was not using an assistive device prior to her stroke.  She states that she is use to going and wants to get her strength and balance back so she can get back to her normal activity which includes going to the [employer] 3 x a week and riding her bike when it is nice out.    Pertinent History HTN, Lt breast cancer 03/24/2018 , Parkinson    Limitations Lifting;Standing;Walking;House hold activities    How long can you sit comfortably? no problem    How long can you stand comfortably? able to do normal ADL but has not tried to cook or stand for prolong period of time.    How long can you walk comfortably? walks with a quad cane now longest she has walked has been ten minutes.    Patient Stated [facility]    Currently in Pain? Yes    Pain Score 7     Pain Location Knee    Pain Orientation Left;Anterior    Pain Descriptors / Indicators Aching    Pain Type Acute pain    Pain Onset In the past 7 days    Pain Frequency Intermittent    Aggravating Factors  not sure    Pain Relieving Factors not sure    Effect of Pain on Daily Activities not sure                [facility] PT Assessment - 01/09/21 0001       Assessment   Medical Diagnosis CVA    Referring Provider (PT) Eulogio Bear    Onset Date/Surgical Date 01/03/21    Prior Therapy IP      Precautions   Precautions None      Restrictions   Weight Bearing Restrictions No      Balance Screen   Has the patient fallen in the past 6 months No    Has the patient had a decrease in activity level because of a fear of falling?  Yes    Is the patient reluctant to leave their home because of a fear of falling?  No      Home Environment   Living Environment Private residence    Type of [place] Access Stairs to enter    Entrance Stairs-Number of Steps 3      Prior Function   Level of  Independence Independent      Cognition   Overall Cognitive Status Within Functional Limits for tasks assessed      Observation/Other Assessments   Focus on Therapeutic Outcomes (FOTO)  56; 44% affected      Functional Tests   Functional tests Single leg stance;Sit to Stand      Single  Leg Stance   Comments Lt: 5"; RT 60"      Sit to Stand   Comments 9 in 30 seconds      ROM / Strength   AROM / PROM / Strength Strength      Strength   Strength Assessment Site Hip;Knee;Ankle    Right/Left Hip Right;Left    Right Hip Flexion 5/5    Right Hip Extension 3+/5    Right Hip ABduction 5/5    Left Hip Flexion 3+/5    Left Hip Extension 3-/5    Left Hip ABduction 3/5    Right/Left Knee Right;Left    Right Knee Flexion 5/5    Right Knee Extension 5/5    Left Knee Flexion 3/5    Left Knee Extension 4/5    Right/Left Ankle Right;Left    Right Ankle Dorsiflexion 5/5    Left Ankle Dorsiflexion 5/5      Ambulation/Gait   Ambulation Distance (Feet) 292 Feet    Assistive device Large base quad cane    Gait Comments 2' walk test                        Objective measurements completed on examination: See above findings.       [facility] Adult PT Treatment/Exercise - 01/09/21 0001       Exercises   Exercises Knee/Hip      Knee/Hip Exercises: Seated   Sit to Sand 10 reps      Knee/Hip Exercises: Supine   Bridges 5 reps    Straight Leg Raises Left;5 reps      Knee/Hip Exercises: Sidelying   Hip ABduction Left;5 reps                     PT Education - 01/09/21 1007     Education Details HEP    Person(s) Educated Patient    Methods Explanation    Comprehension Verbalized understanding;Returned demonstration              PT Short Term Goals - 01/09/21 1015       PT SHORT TERM GOAL #1   Title PT to be I in HEP to improve LT LE strength to allow LT knee pain to decrease to no greater than 5/10.    Time 3    Period Weeks    Status New     Target Date 01/30/21      PT SHORT TERM GOAL #2   Title PT to be walking in her home without the use of an assistive device    Time 3    Period Weeks    Status New      PT SHORT TERM GOAL #3   Title PT to be able to single leg stance on her left LE for 20 seconds to reduce risk of falling.    Time 3    Period Weeks    Status New               PT Long Term Goals - 01/09/21 1017       PT LONG TERM GOAL #1   Title PT to be I in an advance HEP to improve LT knee pain to no greater than 3/10.    Time 6    Period Weeks    Status New    Target Date 02/20/21      PT LONG TERM GOAL #2   Title PT Lt LE strength to  be at least 4/5 to allow pt to feel confident walking without an assistive device outside.    Time 6    Period Weeks    Status New      PT LONG TERM GOAL #3   Title Pt to have retturned to the [employer] at least 2x a week    Time 6    Period Weeks    Status New      PT LONG TERM GOAL #4   Title PT  to be able to single leg stance on her left leg for at least 40 seconds to reduce her risk of falling.    Time 6    Period Weeks    Status New                    Plan - 01/09/21 1010     Clinical Impression Statement Ms. Schnider is a 70 yo female who had a right ischemic stroke on 01/03/2021; she was discharged from the hospital on 01/05/2021 and is currently being referred for evaluation and treatment in skilled out patient setting.  Evaluation reveals decreased strength, decreased balance, decreased activity tolerance and new Lt  knee pain.    Personal Factors and Comorbidities Comorbidity 3+    Comorbidities breast cancer, parkinson, HTN    Examination-Activity Limitations Carry;Lift;Locomotion Level;Reach Overhead;Squat;Stairs;Stand    Examination-Participation Restrictions Church;Cleaning;Community Activity;Laundry;Meal Prep;Shop;Yard Work    Stability/Clinical Decision Making Evolving/Moderate complexity    Clinical Decision Making Moderate    Rehab  Potential Good    PT Frequency 2x / week    PT Duration 6 weeks    PT Treatment/Interventions Therapeutic activities;Therapeutic exercise;Balance training;Patient/family education    PT Next Visit Plan Progress hip  and hamstring strengthening as well as balance    PT Home Exercise Plan sit to stand, bridge, LT SLR and Lt sidelying abduction.             Patient will benefit from skilled therapeutic intervention in order to improve the following deficits and impairments:  Abnormal gait, Decreased activity tolerance, Decreased balance, Difficulty walking, Decreased strength, Pain  Visit Diagnosis: Muscle weakness (generalized) - Plan: PT plan of care cert/re-cert  Unsteadiness on feet - Plan: PT plan of care cert/re-cert     Problem List Patient Active Problem List   Diagnosis Date Noted   History of herpes simplex infection 01/08/2021   Papanicolaou smear, as part of routine gynecological examination 01/08/2021   CVA (cerebral vascular accident) ([place]) 01/04/2021   Acute ischemic stroke ([place]) 01/03/2021   Hypokalemia 01/03/2021   Cerebral aneurysm 01/03/2021   Musculoskeletal pain 01/03/2021   Early satiety 11/27/2020   Chronic abdominal pain 07/13/2020   Port-A-Cath in place 06/23/2018   Malignant neoplasm of upper-inner quadrant of left breast in female, estrogen receptor positive ([place]) 04/16/2018   Essential hypertension 03/21/2015   High cholesterol 03/21/2015   Genital herpes 03/21/2015   Fecal urgency 03/21/2015   GERD (gastroesophageal reflux disease) 03/21/2015   Rayetta Humphrey, PT CLT [phone]  01/09/2021, 10:25 AM  [facility] [address] [place], [place], [address] Phone: [phone]   Fax:  [phone]  Name: Kimberly Mccormick MRN: [id number] Date of Birth: [birth year]

## 2021-01-10 LAB — CYTOLOGY - PAP
Adequacy: ABSENT
Comment: NEGATIVE
Diagnosis: NEGATIVE
High risk HPV: NEGATIVE

## 2021-01-12 ENCOUNTER — Encounter (HOSPITAL_COMMUNITY): Payer: Self-pay

## 2021-01-12 ENCOUNTER — Ambulatory Visit (HOSPITAL_COMMUNITY): Payer: Medicare Other

## 2021-01-12 ENCOUNTER — Other Ambulatory Visit: Payer: Self-pay

## 2021-01-12 ENCOUNTER — Telehealth (HOSPITAL_COMMUNITY): Payer: Self-pay

## 2021-01-12 DIAGNOSIS — M6281 Muscle weakness (generalized): Secondary | ICD-10-CM | POA: Diagnosis not present

## 2021-01-12 DIAGNOSIS — R2681 Unsteadiness on feet: Secondary | ICD-10-CM

## 2021-01-12 DIAGNOSIS — I639 Cerebral infarction, unspecified: Secondary | ICD-10-CM | POA: Diagnosis not present

## 2021-01-12 NOTE — Telephone Encounter (Signed)
Patient planning on attending 9:30 am appointment today. On way.  Acea Yagi Hartnett-Rands, PT

## 2021-01-12 NOTE — Therapy (Signed)
[facility] [address] [place], [place], [address] Phone: [phone]   Fax:  [phone]  Physical Therapy Treatment  Patient Details  Name: Kimberly Mccormick MRN: [id number] Date of Birth: May 11, 1950 Referring Provider (PT): Eulogio Bear   Encounter Date: 01/12/2021   PT End of Session - 01/12/21 0929     Visit Number 2    Number of Visits 12    Date for PT Re-Evaluation 02/20/21    Authorization Type UHC medicare    Progress Note Due on Visit 10    PT Start Time 0930    PT Stop Time 1012    PT Time Calculation (min) 42 min    Activity Tolerance Patient tolerated treatment well    Behavior During Therapy [facility] for tasks assessed/performed             Past Medical History:  Diagnosis Date   Anemia    iron def   Aneurysm ([place])    Breast cancer ([place])    left, Status post chemotherapy and XRT   CHF (congestive heart failure) ([place])    DDD (degenerative disc disease), lumbar    Essential hypertension    Essential tremor    Genital warts    GERD (gastroesophageal reflux disease)    History of anemia    History of renal insufficiency    Hyperlipidemia    Hypertension    Insomnia    Mixed hyperlipidemia    Osteoarthritis    Otitis media    right   Parkinson's disease ([place])    Peripheral neuropathy    d/t chemo   Sinus infection 01/04/2014   Stroke [facility])     Past Surgical History:  Procedure Laterality Date   APPENDECTOMY  1966   BIOPSY  08/29/2020   Procedure: BIOPSY;  Surgeon: Harvel Quale, MD;  [facility];  Service: Gastroenterology;;   BREAST LUMPECTOMY WITH RADIOACTIVE SEED AND SENTINEL LYMPH NODE BIOPSY Left 05/06/2018   Procedure: LEFT BREAST LUMPECTOMY WITH RADIOACTIVE SEED AND LEFT DEEP AXILLARY SENTINEL LYMPH NODE BIOPSY AND BLUE DYE INJECTION;  Surgeon: Fanny Skates, MD;  Location: [place];  Service: General;  Laterality: Left;   CATARACT EXTRACTION W/PHACO Left  01/10/2014   Procedure: CATARACT EXTRACTION PHACO AND INTRAOCULAR LENS PLACEMENT ; CDE:  4.94;  Surgeon: Williams Che, MD;  [facility];  Service: Ophthalmology;  Laterality: Left;   CESAREAN SECTION  1986   CHOLECYSTECTOMY     COLONOSCOPY WITH PROPOFOL N/A 07/25/2020   Procedure: COLONOSCOPY WITH PROPOFOL;  Surgeon: Harvel Quale, MD;  [facility];  Service: Gastroenterology;  Laterality: N/A;  10:55   ESOPHAGOGASTRODUODENOSCOPY (EGD) WITH PROPOFOL N/A 08/29/2020   Procedure: ESOPHAGOGASTRODUODENOSCOPY (EGD) WITH PROPOFOL;  Surgeon: Harvel Quale, MD;  [facility];  Service: Gastroenterology;  Laterality: N/A;  12:00   MYRINGOTOMY WITH TUBE PLACEMENT Right 02/04/2017   Procedure: REVISION OF RIGHT MYRINGOTOMY WITH TUBE PLACEMENT, WITH EXAM OF LEFT EAR;  Surgeon: Leta Baptist, MD;  Location: [place];  Service: ENT;  Laterality: Right;   NASAL SINUS SURGERY  2016   polypectomy   POLYPECTOMY  07/25/2020   Procedure: POLYPECTOMY;  Surgeon: Harvel Quale, MD;  [facility];  Service: Gastroenterology;;   POLYPECTOMY  08/29/2020   Procedure: POLYPECTOMY;  Surgeon: Harvel Quale, MD;  [facility];  Service: Gastroenterology;;   [facility] REMOVAL N/A 01/06/2019   Procedure: REMOVAL PORT-A-CATH;  Surgeon: Fanny Skates, MD;  Location: [place];  Service: General;  Laterality: N/A;   PORTACATH PLACEMENT Right 06/16/2018   Procedure: INSERTION PORT-A-CATH;  Surgeon: Fanny Skates, MD;  Location: [place];  Service: General;  Laterality: Right;   SUBMUCOSAL LIFTING INJECTION  08/29/2020   Procedure: SUBMUCOSAL LIFTING INJECTION;  Surgeon: Harvel Quale, MD;  [facility];  Service: Gastroenterology;;   TONSILLECTOMY  1970    There were no vitals filed for this visit.   Subjective Assessment - 01/12/21 0929     Subjective Patient  reports she has performed her HEP and doesn't have any questions. Wants to travel to [place] for the holiday. Has an MD appointment at 11:00 am today to determine that.    Pertinent History HTN, Lt breast cancer 03/24/2018 , Parkinson    Limitations Lifting;Standing;Walking;House hold activities    How long can you sit comfortably? no problem    How long can you stand comfortably? able to do normal ADL but has not tried to cook or stand for prolong period of time.    How long can you walk comfortably? walks with a quad cane now longest she has walked has been ten minutes.    [facility]    Currently in Pain? Yes    Pain Score 3     Pain Location Knee    Pain Orientation Left;Anterior;Medial    Pain Descriptors / Indicators Aching    Pain Onset In the past 7 days              [facility] Adult PT Treatment/Exercise - 01/12/21 0001       Knee/Hip Exercises: Standing   Heel Raises Both;1 set;10 reps    Heel Raises Limitations + toe raises x10    SLS 30 seconds x2 with intermittent UE support      Knee/Hip Exercises: Seated   Sit to Sand 10 reps;2 sets;without UE support      Knee/Hip Exercises: Supine   Bridges Strengthening;Both;1 set;10 reps    Bridges Limitations 3 sec hold    Straight Leg Raises Strengthening;Left;2 sets      Knee/Hip Exercises: Sidelying   Hip ABduction Left;5 reps;2 sets      Knee/Hip Exercises: Prone   Hamstring Curl 2 sets;5 reps    Hip Extension Strengthening;Left;2 sets;5 reps               PT Education - 01/12/21 0943     Education Details Reviewed initial evaluation, goals, and initial evaluation. Advanced HEP.    Person(s) Educated Patient    Methods Explanation;Handout    Comprehension Verbalized understanding              PT Short Term Goals - 01/09/21 1015       PT SHORT TERM GOAL #1   Title PT to be I in HEP to improve LT LE strength to allow LT knee pain to decrease to no greater than 5/10.    Time 3     Period Weeks    Status New    Target Date 01/30/21      PT SHORT TERM GOAL #2   Title PT to be walking in her home without the use of an assistive device    Time 3    Period Weeks    Status New      PT SHORT TERM GOAL #3   Title PT to be able to single  leg stance on her left LE for 20 seconds to reduce risk of falling.    Time 3    Period Weeks    Status New               PT Long Term Goals - 01/09/21 1017       PT LONG TERM GOAL #1   Title PT to be I in an advance HEP to improve LT knee pain to no greater than 3/10.    Time 6    Period Weeks    Status New    Target Date 02/20/21      PT LONG TERM GOAL #2   Title PT Lt LE strength to be at least 4/5 to allow pt to feel confident walking without an assistive device outside.    Time 6    Period Weeks    Status New      PT LONG TERM GOAL #3   Title Pt to have retturned to the [employer] at least 2x a week    Time 6    Period Weeks    Status New      PT LONG TERM GOAL #4   Title PT  to be able to single leg stance on her left leg for at least 40 seconds to reduce her risk of falling.    Time 6    Period Weeks    Status New               Plan - 01/12/21 3474     Clinical Impression Statement Reviewed initial evaluation, goals and initial HEP. Patient performed well and repetitions and holds were increased. Added prone hip strengthening and standing ankle strengthening exercises to therapeutic exercises and HEP. Added single leg stance to therapeutic exercises. Patient tolerated treatment well today with fatigue noted at end of session. Patient would continue to benefit from skilled physical therapy to reduce impairment and improve functional abilities.    Personal Factors and Comorbidities Comorbidity 3+    Comorbidities breast cancer, parkinson, HTN    Examination-Activity Limitations Carry;Lift;Locomotion Level;Reach Overhead;Squat;Stairs;Stand    Examination-Participation Restrictions  Church;Cleaning;Community Activity;Laundry;Meal Prep;Shop;Yard Work    Stability/Clinical Decision Making Evolving/Moderate complexity    Rehab Potential Good    PT Frequency 2x / week    PT Duration 6 weeks    PT Treatment/Interventions Therapeutic activities;Therapeutic exercise;Balance training;Patient/family education    PT Next Visit Plan Progress hip  and hamstring strengthening as well as balance    PT Home Exercise Plan sit to stand, bridge, LT SLR and Lt sidelying abduction; 12/23 - prone hip extension, prone ham curl, sidelying clam, stand heel/toe raise    Consulted and Agree with Plan of Care Patient             Patient will benefit from skilled therapeutic intervention in order to improve the following deficits and impairments:  Abnormal gait, Decreased activity tolerance, Decreased balance, Difficulty walking, Decreased strength, Pain  Visit Diagnosis: Muscle weakness (generalized)  Unsteadiness on feet     Problem List Patient Active Problem List   Diagnosis Date Noted   History of herpes simplex infection 01/08/2021   Papanicolaou smear, as part of routine gynecological examination 01/08/2021   CVA (cerebral vascular accident) ([place]) 01/04/2021   Acute ischemic stroke ([place]) 01/03/2021   Hypokalemia 01/03/2021   Cerebral aneurysm 01/03/2021   Musculoskeletal pain 01/03/2021   Early satiety 11/27/2020   Chronic abdominal pain 07/13/2020   Port-A-Cath in place 06/23/2018   Malignant  neoplasm of upper-inner quadrant of left breast in female, estrogen receptor positive ([place]) 04/16/2018   Essential hypertension 03/21/2015   High cholesterol 03/21/2015   Genital herpes 03/21/2015   Fecal urgency 03/21/2015   GERD (gastroesophageal reflux disease) 03/21/2015   Floria Raveling. Hartnett-Rands, MS, PT [facility] [place] [phone]  Jeannie Done, PT 01/12/2021, 10:18 AM  [facility] [place], [place], [address] Phone: [phone]   Fax:  [phone]  Name: Kimberly Mccormick MRN: [id number] Date of Birth: 03/04/1950

## 2021-01-12 NOTE — Patient Instructions (Addendum)
Clam Shell 45 Degrees    Lying with hips and knees bent 45, one pillow between knees and ankles, if you want to. Lift left knee. Be sure pelvis does not roll backward. Do not arch back. Do _5-15__ times, each leg, _1__ times per day.  http://ss.exer.us/75   Copyright  VHI. All rights reserved.   Hip Extension: Prone    Tighten gluteal muscle. Lift one leg _5-15__ times. Restabilize pelvis. Repeat with other leg. Keep pelvis still. Be sure pelvis does not rotate and back does not arch. Do 2-3_ sets, _1__ times per day.  http://ss.exer.us/65   Copyright  VHI. All rights reserved.   Hamstrings    Lie on stomach. Bend same knee as much as you can, pointing toes toward knee. Do not bend hips. Hold _2-3__ seconds. Repeat _10-15__ times. Do _1__ sessions per day. CAUTION: Move slowly. You can add an ankle weight of 1-2 pounds when this gets easy. You can put a bag of rice in a tube sock and tie it around you ankle for a homemade ankle weight.   Copyright  VHI. All rights reserved.   Toe / Heel Raise (Standing)    Standing with support, raise heels, then rock back on heels and raise toes. Repeat _10-15_ times.  Copyright  VHI. All rights reserved.

## 2021-01-16 ENCOUNTER — Other Ambulatory Visit: Payer: Self-pay

## 2021-01-16 ENCOUNTER — Encounter (HOSPITAL_COMMUNITY): Payer: Self-pay

## 2021-01-16 ENCOUNTER — Ambulatory Visit (HOSPITAL_COMMUNITY): Payer: Medicare Other

## 2021-01-16 VITALS — BP 142/85

## 2021-01-16 DIAGNOSIS — R2681 Unsteadiness on feet: Secondary | ICD-10-CM

## 2021-01-16 DIAGNOSIS — M6281 Muscle weakness (generalized): Secondary | ICD-10-CM | POA: Diagnosis not present

## 2021-01-16 DIAGNOSIS — I639 Cerebral infarction, unspecified: Secondary | ICD-10-CM

## 2021-01-16 NOTE — Patient Instructions (Signed)
Tandem Stance    Right foot in front of left, heel touching toe both feet "straight ahead". Stand on Foot Triangle of Support with both feet.  Balance in this position 30 seconds. Do with left foot in front of right.  AMBULATION: Side Step    Standing front of counter in kitchen or bathroom. Step sideways. Repeat in opposite direction. ___ reps per set, ___ sets per day, ___ days per week Use assistive device.  Walk to tempo of metronome or music.  Copyright  VHI. All rights reserved.

## 2021-01-16 NOTE — Therapy (Signed)
[facility] [address] [place], [place], [address] Phone: [phone]   Fax:  [phone]  Physical Therapy Treatment  Patient Details  Name: Kimberly Mccormick MRN: [id number] Date of Birth: Dec 20, 1950 Referring Provider (PT): Eulogio Bear   Encounter Date: 01/16/2021   PT End of Session - 01/16/21 1811     Visit Number 3    Number of Visits 12    Date for PT Re-Evaluation 02/20/21    Authorization Type UHC medicare    Progress Note Due on Visit 10    PT Start Time 1619    PT Stop Time 6962    PT Time Calculation (min) 39 min    Activity Tolerance Patient tolerated treatment well    Behavior During Therapy [facility] for tasks assessed/performed             Past Medical History:  Diagnosis Date   Anemia    iron def   Aneurysm ([place])    Breast cancer ([place])    left, Status post chemotherapy and XRT   CHF (congestive heart failure) ([place])    DDD (degenerative disc disease), lumbar    Essential hypertension    Essential tremor    Genital warts    GERD (gastroesophageal reflux disease)    History of anemia    History of renal insufficiency    Hyperlipidemia    Hypertension    Insomnia    Mixed hyperlipidemia    Osteoarthritis    Otitis media    right   Parkinson's disease ([place])    Peripheral neuropathy    d/t chemo   Sinus infection 01/04/2014   Stroke [facility])     Past Surgical History:  Procedure Laterality Date   APPENDECTOMY  1966   BIOPSY  08/29/2020   Procedure: BIOPSY;  Surgeon: Harvel Quale, MD;  [facility];  Service: Gastroenterology;;   BREAST LUMPECTOMY WITH RADIOACTIVE SEED AND SENTINEL LYMPH NODE BIOPSY Left 05/06/2018   Procedure: LEFT BREAST LUMPECTOMY WITH RADIOACTIVE SEED AND LEFT DEEP AXILLARY SENTINEL LYMPH NODE BIOPSY AND BLUE DYE INJECTION;  Surgeon: Fanny Skates, MD;  Location: [place];  Service: General;  Laterality: Left;   CATARACT EXTRACTION W/PHACO Left  01/10/2014   Procedure: CATARACT EXTRACTION PHACO AND INTRAOCULAR LENS PLACEMENT ; CDE:  4.94;  Surgeon: Williams Che, MD;  [facility]: [facility];  Service: Ophthalmology;  Laterality: Left;   CESAREAN SECTION  1986   CHOLECYSTECTOMY     COLONOSCOPY WITH PROPOFOL N/A 07/25/2020   Procedure: COLONOSCOPY WITH PROPOFOL;  Surgeon: Harvel Quale, MD;  [facility];  Service: Gastroenterology;  Laterality: N/A;  10:55   ESOPHAGOGASTRODUODENOSCOPY (EGD) WITH PROPOFOL N/A 08/29/2020   Procedure: ESOPHAGOGASTRODUODENOSCOPY (EGD) WITH PROPOFOL;  Surgeon: Harvel Quale, MD;  [facility];  Service: Gastroenterology;  Laterality: N/A;  12:00   MYRINGOTOMY WITH TUBE PLACEMENT Right 02/04/2017   Procedure: REVISION OF RIGHT MYRINGOTOMY WITH TUBE PLACEMENT, WITH EXAM OF LEFT EAR;  Surgeon: Leta Baptist, MD;  Location: [place];  Service: ENT;  Laterality: Right;   NASAL SINUS SURGERY  2016   polypectomy   POLYPECTOMY  07/25/2020   Procedure: POLYPECTOMY;  Surgeon: Harvel Quale, MD;  [facility];  Service: Gastroenterology;;   POLYPECTOMY  08/29/2020   Procedure: POLYPECTOMY;  Surgeon: Harvel Quale, MD;  [facility];  Service: Gastroenterology;;   [facility] REMOVAL N/A 01/06/2019   Procedure: REMOVAL PORT-A-CATH;  Surgeon: Fanny Skates, MD;  Location: [place];  Service: General;  Laterality: N/A;   PORTACATH PLACEMENT Right 06/16/2018   Procedure: INSERTION PORT-A-CATH;  Surgeon: Fanny Skates, MD;  Location: [facility];  Service: General;  Laterality: Right;   SUBMUCOSAL LIFTING INJECTION  08/29/2020   Procedure: SUBMUCOSAL LIFTING INJECTION;  Surgeon: Harvel Quale, MD;  [facility];  Service: Gastroenterology;;   TONSILLECTOMY  1970    Vitals:   01/16/21 1622  BP: (!) 142/85     Subjective Assessment - 01/16/21 1622     Subjective Pt stated  she has slight pain on Lt side, comes and goes.  Has been compliant with current HEP without questions.    Pertinent History HTN, Lt breast cancer 03/24/2018 , Parkinson    [facility]    Currently in Pain? Yes    Pain Score 2     Pain Location Flank    Pain Orientation Left    Pain Descriptors / Indicators Aching;Sore    Pain Onset 1 to 4 weeks ago    Pain Frequency Intermittent    Aggravating Factors  not sure    Pain Relieving Factors not sure    Effect of Pain on Daily Activities not sure                [facility] PT Assessment - 01/16/21 0001       Assessment   Medical Diagnosis CVA    Referring Provider (PT) Eulogio Bear    Onset Date/Surgical Date 01/03/21    Next MD Visit February    Prior Therapy IP      Precautions   Precautions None                           [facility] Adult PT Treatment/Exercise - 01/16/21 0001       Knee/Hip Exercises: Standing   Heel Raises Both;1 set;10 reps    Heel Raises Limitations + toe raises x10    Knee Flexion Both;10 reps    Hip Flexion Both;2 sets;10 reps;Knee bent    Hip Flexion Limitations toe tapping alternating 6in step height    Hip Abduction Both;10 reps;Knee straight    Functional Squat 3 sets;5 reps    Functional Squat Limitations cueing for mechanics, front of chair    SLS Rt 30", Lt 10" max of 5    Other Standing Knee Exercises Tandem stance 1x 30" solid; 2x 30" on foam    Other Standing Knee Exercises sidestep 5RT inside // bars, no HHA                       PT Short Term Goals - 01/09/21 1015       PT SHORT TERM GOAL #1   Title PT to be I in HEP to improve LT LE strength to allow LT knee pain to decrease to no greater than 5/10.    Time 3    Period Weeks    Status New    Target Date 01/30/21      PT SHORT TERM GOAL #2   Title PT to be walking in her home without the use of an assistive device    Time 3    Period Weeks    Status New      PT SHORT TERM  GOAL #3   Title PT to be able to single  leg stance on her left LE for 20 seconds to reduce risk of falling.    Time 3    Period Weeks    Status New               PT Long Term Goals - 01/09/21 1017       PT LONG TERM GOAL #1   Title PT to be I in an advance HEP to improve LT knee pain to no greater than 3/10.    Time 6    Period Weeks    Status New    Target Date 02/20/21      PT LONG TERM GOAL #2   Title PT Lt LE strength to be at least 4/5 to allow pt to feel confident walking without an assistive device outside.    Time 6    Period Weeks    Status New      PT LONG TERM GOAL #3   Title Pt to have [facility] to [facility] [employer] at least 2x a week    Time 6    Period Weeks    Status New      PT LONG TERM GOAL #4   Title PT  to be able to single leg stance on her left leg for at least 40 seconds to reduce her risk of falling.    Time 6    Period Weeks    Status New                   Plan - 01/16/21 1812     Clinical Impression Statement Session focus with closed chain LE strengthening and balance activiites which pt tolerated well.  Added functional strengthening exercises with cueing for mechanics and static/dynamic balalnce activities.  Pt was limited by fatigue required periodic seated rest breaks.  Added tandem stance and sidestepping, encouraged to complete infront of counter/sink for safety with verbalized understanding.    Personal Factors and Comorbidities Comorbidity 3+    Comorbidities breast cancer, parkinson, HTN    Examination-Activity Limitations Carry;Lift;Locomotion Level;Reach Overhead;Squat;Stairs;Stand    Examination-Participation Restrictions Church;Cleaning;Community Activity;Laundry;Meal Prep;Shop;Yard Work    Stability/Clinical Decision Making Evolving/Moderate complexity    Clinical Decision Making Moderate    Rehab Potential Good    PT Frequency 2x / week    PT Duration 6 weeks    PT Treatment/Interventions Therapeutic  activities;Therapeutic exercise;Balance training;Patient/family education    PT Next Visit Plan Progress hip  and hamstring strengthening as well as balance    PT Home Exercise Plan sit to stand, bridge, LT SLR and Lt sidelying abduction; 12/23 - prone hip extension, prone ham curl, sidelying clam, stand heel/toe raise; 12/27:  tandem stance and sidestep front of counter.    Consulted and Agree with Plan of Care Patient             Patient will benefit from skilled therapeutic intervention in order to improve the following deficits and impairments:  Abnormal gait, Decreased activity tolerance, Decreased balance, Difficulty walking, Decreased strength, Pain  Visit Diagnosis: Muscle weakness (generalized)  Unsteadiness on feet     Problem List Patient Active Problem List   Diagnosis Date Noted   History of herpes simplex infection 01/08/2021   Papanicolaou smear, as part of routine gynecological examination 01/08/2021   CVA (cerebral vascular accident) ([place]) 01/04/2021   Acute ischemic stroke ([place]) 01/03/2021   Hypokalemia 01/03/2021   Cerebral aneurysm 01/03/2021   Musculoskeletal pain 01/03/2021   Early satiety 11/27/2020   Chronic abdominal pain  07/13/2020   Port-A-Cath in place 06/23/2018   Malignant neoplasm of upper-inner quadrant of left breast in female, estrogen receptor positive ([place]) 04/16/2018   Essential hypertension 03/21/2015   High cholesterol 03/21/2015   Genital herpes 03/21/2015   Fecal urgency 03/21/2015   GERD (gastroesophageal reflux disease) 03/21/2015   Ihor Austin, LPTA/CLT; CBIS [phone]  Aldona Lento, PTA 01/16/2021, 6:16 PM  [facility] [address] [place], [place], [address] Phone: [phone]   Fax:  [phone]  Name: Kimberly Mccormick MRN: [id number] Date of Birth: [birth year]

## 2021-01-17 DIAGNOSIS — I639 Cerebral infarction, unspecified: Secondary | ICD-10-CM | POA: Diagnosis not present

## 2021-01-19 ENCOUNTER — Other Ambulatory Visit: Payer: Self-pay

## 2021-01-19 ENCOUNTER — Ambulatory Visit (HOSPITAL_COMMUNITY): Payer: Medicare Other

## 2021-01-19 DIAGNOSIS — M6281 Muscle weakness (generalized): Secondary | ICD-10-CM

## 2021-01-19 DIAGNOSIS — I639 Cerebral infarction, unspecified: Secondary | ICD-10-CM | POA: Diagnosis not present

## 2021-01-19 DIAGNOSIS — R2681 Unsteadiness on feet: Secondary | ICD-10-CM

## 2021-01-19 NOTE — Therapy (Signed)
[facility] [address] [place], [place], [address] Phone: [phone]   Fax:  [phone]  Physical Therapy Treatment  Patient Details  Name: Kimberly Mccormick MRN: [id number] Date of Birth: 17-May-1950 Referring Provider (PT): Eulogio Bear   Encounter Date: 01/19/2021   PT End of Session - 01/19/21 1433     Visit Number 4    Number of Visits 12    Date for PT Re-Evaluation 02/20/21    Authorization Type UHC medicare    Progress Note Due on Visit 10    PT Start Time 1430    PT Stop Time 1515    PT Time Calculation (min) 45 min    Activity Tolerance Patient tolerated treatment well    Behavior During Therapy [facility] for tasks assessed/performed             Past Medical History:  Diagnosis Date   Anemia    iron def   Aneurysm ([place])    Breast cancer ([place])    left, Status post chemotherapy and XRT   CHF (congestive heart failure) ([place])    DDD (degenerative disc disease), lumbar    Essential hypertension    Essential tremor    Genital warts    GERD (gastroesophageal reflux disease)    History of anemia    History of renal insufficiency    Hyperlipidemia    Hypertension    Insomnia    Mixed hyperlipidemia    Osteoarthritis    Otitis media    right   Parkinson's disease ([place])    Peripheral neuropathy    d/t chemo   Sinus infection 01/04/2014   Stroke [facility])     Past Surgical History:  Procedure Laterality Date   APPENDECTOMY  1966   BIOPSY  08/29/2020   Procedure: BIOPSY;  Surgeon: Harvel Quale, MD;  [facility];  Service: Gastroenterology;;   BREAST LUMPECTOMY WITH RADIOACTIVE SEED AND SENTINEL LYMPH NODE BIOPSY Left 05/06/2018   Procedure: LEFT BREAST LUMPECTOMY WITH RADIOACTIVE SEED AND LEFT DEEP AXILLARY SENTINEL LYMPH NODE BIOPSY AND BLUE DYE INJECTION;  Surgeon: Fanny Skates, MD;  Location: [place];  Service: General;  Laterality: Left;   CATARACT EXTRACTION W/PHACO Left  01/10/2014   Procedure: CATARACT EXTRACTION PHACO AND INTRAOCULAR LENS PLACEMENT ; CDE:  4.94;  Surgeon: Williams Che, MD;  [facility];  Service: Ophthalmology;  Laterality: Left;   CESAREAN SECTION  1986   CHOLECYSTECTOMY     COLONOSCOPY WITH PROPOFOL N/A 07/25/2020   Procedure: COLONOSCOPY WITH PROPOFOL;  Surgeon: Harvel Quale, MD;  [facility];  Service: Gastroenterology;  Laterality: N/A;  10:55   ESOPHAGOGASTRODUODENOSCOPY (EGD) WITH PROPOFOL N/A 08/29/2020   Procedure: ESOPHAGOGASTRODUODENOSCOPY (EGD) WITH PROPOFOL;  Surgeon: Harvel Quale, MD;  [facility];  Service: Gastroenterology;  Laterality: N/A;  12:00   MYRINGOTOMY WITH TUBE PLACEMENT Right 02/04/2017   Procedure: REVISION OF RIGHT MYRINGOTOMY WITH TUBE PLACEMENT, WITH EXAM OF LEFT EAR;  Surgeon: Leta Baptist, MD;  Location: [place];  Service: ENT;  Laterality: Right;   NASAL SINUS SURGERY  2016   polypectomy   POLYPECTOMY  07/25/2020   Procedure: POLYPECTOMY;  Surgeon: Harvel Quale, MD;  [facility];  Service: Gastroenterology;;   POLYPECTOMY  08/29/2020   Procedure: POLYPECTOMY;  Surgeon: Harvel Quale, MD;  [facility];  Service: Gastroenterology;;   [facility] REMOVAL N/A 01/06/2019   Procedure: REMOVAL PORT-A-CATH;  Surgeon: Fanny Skates, MD;  Location: [place];  Service: General;  Laterality: N/A;   PORTACATH PLACEMENT Right 06/16/2018   Procedure: INSERTION PORT-A-CATH;  Surgeon: Fanny Skates, MD;  Location: [place];  Service: General;  Laterality: Right;   SUBMUCOSAL LIFTING INJECTION  08/29/2020   Procedure: SUBMUCOSAL LIFTING INJECTION;  Surgeon: Harvel Quale, MD;  [facility];  Service: Gastroenterology;;   TONSILLECTOMY  1970    There were no vitals filed for this visit.   Subjective Assessment - 01/19/21 1442     Subjective Having  difficulty with balance but been walking in the house some without cane    Pertinent History HTN, Lt breast cancer 03/24/2018 , Parkinson    [facility]    Pain Onset 1 to 4 weeks ago                               [facility] Adult PT Treatment/Exercise - 01/19/21 0001       Ambulation/Gait   Pre-Gait Activities RLE elevated on 12" stair performing 3x10 lift-offs to improve LLE weight acceptance/midstance control. Stair taps 12" step with 3" ankle weights to improve step height and foot clearance x 2 min.      Neuro Re-ed    Neuro Re-ed Details  bilateral ball toss 3x10, unilateral catch/throw with rubber ball and .5 kg ball. Quick catch BUE 2x10 to improve LUE coordination. Static stance on airex pad 3x30 sec eyes closed to improve proprioception. Heel raise 2x10 on airex pad to elicit ankle strategy      Knee/Hip Exercises: Standing   Other Standing Knee Exercises sidestepping x 60 sec 3 lbs.                       PT Short Term Goals - 01/09/21 1015       PT SHORT TERM GOAL #1   Title PT to be I in HEP to improve LT LE strength to allow LT knee pain to decrease to no greater than 5/10.    Time 3    Period Weeks    Status New    Target Date 01/30/21      PT SHORT TERM GOAL #2   Title PT to be walking in her home without the use of an assistive device    Time 3    Period Weeks    Status New      PT SHORT TERM GOAL #3   Title PT to be able to single leg stance on her left LE for 20 seconds to reduce risk of falling.    Time 3    Period Weeks    Status New               PT Long Term Goals - 01/09/21 1017       PT LONG TERM GOAL #1   Title PT to be I in an advance HEP to improve LT knee pain to no greater than 3/10.    Time 6    Period Weeks    Status New    Target Date 02/20/21      PT LONG TERM GOAL #2   Title PT Lt LE strength to be at least 4/5 to allow pt to feel confident walking without an assistive  device outside.    Time 6    Period Weeks  Status New      PT LONG TERM GOAL #3   Title Pt to have retturned to the [employer] at least 2x a week    Time 6    Period Weeks    Status New      PT LONG TERM GOAL #4   Title PT  to be able to single leg stance on her left leg for at least 40 seconds to reduce her risk of falling.    Time 6    Period Weeks    Status New                   Plan - 01/19/21 1518     Clinical Impression Statement Demonstrates LLE shuffling/decreased foot clearance during gait without AD. Emphasis on single limb support during today's session to improve midstance control to facilitate normalized gait mechanics.  Difficulty with performing rapid, alternating movements with LUE/LLE with improving coordination given additional trials.  Continued sessions indicated to improve LLE/LUE coordination, strength, and proprioception to improve functional mobility    Personal Factors and Comorbidities Comorbidity 3+    Comorbidities breast cancer, parkinson, HTN    Examination-Activity Limitations Carry;Lift;Locomotion Level;Reach Overhead;Squat;Stairs;Stand    Examination-Participation Restrictions Church;Cleaning;Community Activity;Laundry;Meal Prep;Shop;Yard Work    Stability/Clinical Decision Making Evolving/Moderate complexity    Rehab Potential Good    PT Frequency 2x / week    PT Duration 6 weeks    PT Treatment/Interventions Therapeutic activities;Therapeutic exercise;Balance training;Patient/family education    PT Next Visit Plan Progress hip  and hamstring strengthening as well as balance    PT Home Exercise Plan sit to stand, bridge, LT SLR and Lt sidelying abduction; 12/23 - prone hip extension, prone ham curl, sidelying clam, stand heel/toe raise; 12/27:  tandem stance and sidestep front of counter.    Consulted and Agree with Plan of Care Patient             Patient will benefit from skilled therapeutic intervention in order to improve the  following deficits and impairments:  Abnormal gait, Decreased activity tolerance, Decreased balance, Difficulty walking, Decreased strength, Pain  Visit Diagnosis: Muscle weakness (generalized)  Unsteadiness on feet     Problem List Patient Active Problem List   Diagnosis Date Noted   History of herpes simplex infection 01/08/2021   Papanicolaou smear, as part of routine gynecological examination 01/08/2021   CVA (cerebral vascular accident) ([place]) 01/04/2021   Acute ischemic stroke ([place]) 01/03/2021   Hypokalemia 01/03/2021   Cerebral aneurysm 01/03/2021   Musculoskeletal pain 01/03/2021   Early satiety 11/27/2020   Chronic abdominal pain 07/13/2020   Port-A-Cath in place 06/23/2018   Malignant neoplasm of upper-inner quadrant of left breast in female, estrogen receptor positive ([place]) 04/16/2018   Essential hypertension 03/21/2015   High cholesterol 03/21/2015   Genital herpes 03/21/2015   Fecal urgency 03/21/2015   GERD (gastroesophageal reflux disease) 03/21/2015    Toniann Fail, PT 01/19/2021, 3:21 PM  [facility] [place], [place], [address] Phone: [phone]   Fax:  [phone]  Name: Kimberly Mccormick MRN: [id number] Date of Birth: 22-Oct-1950

## 2021-01-21 ENCOUNTER — Encounter: Payer: Self-pay | Admitting: Hematology and Oncology

## 2021-01-23 ENCOUNTER — Ambulatory Visit (HOSPITAL_COMMUNITY): Payer: Medicare Other | Attending: Internal Medicine

## 2021-01-23 ENCOUNTER — Other Ambulatory Visit: Payer: Self-pay

## 2021-01-23 ENCOUNTER — Encounter (HOSPITAL_COMMUNITY): Payer: Self-pay

## 2021-01-23 DIAGNOSIS — M6281 Muscle weakness (generalized): Secondary | ICD-10-CM | POA: Diagnosis not present

## 2021-01-23 DIAGNOSIS — R2681 Unsteadiness on feet: Secondary | ICD-10-CM | POA: Diagnosis not present

## 2021-01-23 DIAGNOSIS — R29818 Other symptoms and signs involving the nervous system: Secondary | ICD-10-CM | POA: Diagnosis not present

## 2021-01-23 NOTE — Patient Instructions (Signed)
Bracing With Arm / Leg Raise (Quadruped)    On hands and knees find neutral spine. Tighten pelvic floor and abdominals and hold.  Alternating, lift arm to shoulder level and opposite leg to hip level.  Repeat 10 times. Do 1-2 times a day.   Copyright  VHI. All rights reserved.

## 2021-01-23 NOTE — Therapy (Signed)
[facility] [address] [place], [place], [address] Phone: [phone]   Fax:  [phone]  Physical Therapy Treatment  Patient Details  Name: Kimberly Mccormick MRN: [id number] Date of Birth: 06-Apr-1950 Referring Provider (PT): Eulogio Bear   Encounter Date: 01/23/2021   PT End of Session - 01/23/21 1053     Visit Number 5    Number of Visits 12    Date for PT Re-Evaluation 02/20/21    Authorization Type UHC medicare    Progress Note Due on Visit 10    PT Start Time 8366    PT Stop Time 2947    PT Time Calculation (min) 44 min    Activity Tolerance Patient tolerated treatment well    Behavior During Therapy [facility] for tasks assessed/performed             Past Medical History:  Diagnosis Date   Anemia    iron def   Aneurysm ([place])    Breast cancer ([place])    left, Status post chemotherapy and XRT   CHF (congestive heart failure) ([place])    DDD (degenerative disc disease), lumbar    Essential hypertension    Essential tremor    Genital warts    GERD (gastroesophageal reflux disease)    History of anemia    History of renal insufficiency    Hyperlipidemia    Hypertension    Insomnia    Mixed hyperlipidemia    Osteoarthritis    Otitis media    right   Parkinson's disease ([place])    Peripheral neuropathy    d/t chemo   Sinus infection 01/04/2014   Stroke [facility])     Past Surgical History:  Procedure Laterality Date   APPENDECTOMY  1966   BIOPSY  08/29/2020   Procedure: BIOPSY;  Surgeon: Harvel Quale, MD;  [facility];  Service: Gastroenterology;;   BREAST LUMPECTOMY WITH RADIOACTIVE SEED AND SENTINEL LYMPH NODE BIOPSY Left 05/06/2018   Procedure: LEFT BREAST LUMPECTOMY WITH RADIOACTIVE SEED AND LEFT DEEP AXILLARY SENTINEL LYMPH NODE BIOPSY AND BLUE DYE INJECTION;  Surgeon: Fanny Skates, MD;  Location: [place];  Service: General;  Laterality: Left;   CATARACT EXTRACTION W/PHACO Left  01/10/2014   Procedure: CATARACT EXTRACTION PHACO AND INTRAOCULAR LENS PLACEMENT ; CDE:  4.94;  Surgeon: Williams Che, MD;  [facility]: [facility];  Service: Ophthalmology;  Laterality: Left;   CESAREAN SECTION  1986   CHOLECYSTECTOMY     COLONOSCOPY WITH PROPOFOL N/A 07/25/2020   Procedure: COLONOSCOPY WITH PROPOFOL;  Surgeon: Harvel Quale, MD;  [facility];  Service: Gastroenterology;  Laterality: N/A;  10:55   ESOPHAGOGASTRODUODENOSCOPY (EGD) WITH PROPOFOL N/A 08/29/2020   Procedure: ESOPHAGOGASTRODUODENOSCOPY (EGD) WITH PROPOFOL;  Surgeon: Harvel Quale, MD;  [facility];  Service: Gastroenterology;  Laterality: N/A;  12:00   MYRINGOTOMY WITH TUBE PLACEMENT Right 02/04/2017   Procedure: REVISION OF RIGHT MYRINGOTOMY WITH TUBE PLACEMENT, WITH EXAM OF LEFT EAR;  Surgeon: Leta Baptist, MD;  Location: [place];  Service: ENT;  Laterality: Right;   NASAL SINUS SURGERY  2016   polypectomy   POLYPECTOMY  07/25/2020   Procedure: POLYPECTOMY;  Surgeon: Harvel Quale, MD;  [facility];  Service: Gastroenterology;;   POLYPECTOMY  08/29/2020   Procedure: POLYPECTOMY;  Surgeon: Harvel Quale, MD;  [facility];  Service: Gastroenterology;;   [facility] REMOVAL N/A 01/06/2019   Procedure: REMOVAL PORT-A-CATH;  Surgeon: Fanny Skates, MD;  Location: [facility];  Service: General;  Laterality: N/A;   PORTACATH PLACEMENT Right 06/16/2018   Procedure: INSERTION PORT-A-CATH;  Surgeon: Fanny Skates, MD;  Location: [place];  Service: General;  Laterality: Right;   SUBMUCOSAL LIFTING INJECTION  08/29/2020   Procedure: SUBMUCOSAL LIFTING INJECTION;  Surgeon: Harvel Quale, MD;  [facility];  Service: Gastroenterology;;   TONSILLECTOMY  1970    There were no vitals filed for this visit.   Subjective Assessment - 01/23/21 1052     Subjective Has been  walking around the house without cane some, no reports of LOB episodes.    Pertinent History HTN, Lt breast cancer 03/24/2018 , Parkinson    [facility]    Currently in Pain? No/denies                               [facility] Adult PT Treatment/Exercise - 01/23/21 0001       Knee/Hip Exercises: Machines for Strengthening   Other Machine leg press 2x 10 slow control with 2Pl      Knee/Hip Exercises: Standing   Hip Flexion Both;2 sets;10 reps;Knee bent    Hip Flexion Limitations toe tapping alternating 12in step height 30" x 2    SLS with Vectors 3x 5" BLE intermittent HHA    Rebounder on airex x 20 with red weight ball (soft knees minisquat)    Other Standing Knee Exercises Paloff GTB with 1 foot on 12in step height 4x 10      Knee/Hip Exercises: Prone   Other Prone Exercises quadruped UE 5x 5" then LE 5x 5"                       PT Short Term Goals - 01/09/21 1015       PT SHORT TERM GOAL #1   Title PT to be I in HEP to improve LT LE strength to allow LT knee pain to decrease to no greater than 5/10.    Time 3    Period Weeks    Status New    Target Date 01/30/21      PT SHORT TERM GOAL #2   Title PT to be walking in her home without the use of an assistive device    Time 3    Period Weeks    Status New      PT SHORT TERM GOAL #3   Title PT to be able to single leg stance on her left LE for 20 seconds to reduce risk of falling.    Time 3    Period Weeks    Status New               PT Long Term Goals - 01/09/21 1017       PT LONG TERM GOAL #1   Title PT to be I in an advance HEP to improve LT knee pain to no greater than 3/10.    Time 6    Period Weeks    Status New    Target Date 02/20/21      PT LONG TERM GOAL #2   Title PT Lt LE strength to be at least 4/5 to allow pt to feel confident walking without an assistive device outside.    Time 6    Period Weeks    Status New  PT LONG TERM GOAL  #3   Title Pt to have retturned to the [employer] at least 2x a week    Time 6    Period Weeks    Status New      PT LONG TERM GOAL #4   Title PT  to be able to single leg stance on her left leg for at least 40 seconds to reduce her risk of falling.    Time 6    Period Weeks    Status New                   Plan - 01/23/21 1304     Clinical Impression Statement Added leg press, quadruped exercises, rebounder and paloff exercises to progress core, hip and balalnce training that was tolerated well by pt.  Pt was limited by fatigue with periodic rest breaks thorugh session.    Personal Factors and Comorbidities Comorbidity 3+    Comorbidities breast cancer, parkinson, HTN    Examination-Activity Limitations Carry;Lift;Locomotion Level;Reach Overhead;Squat;Stairs;Stand    Examination-Participation Restrictions Church;Cleaning;Community Activity;Laundry;Meal Prep;Shop;Yard Work    Stability/Clinical Decision Making Evolving/Moderate complexity    Clinical Decision Making Moderate    Rehab Potential Good    PT Frequency 2x / week    PT Duration 6 weeks    PT Treatment/Interventions Therapeutic activities;Therapeutic exercise;Balance training;Patient/family education    PT Next Visit Plan Progress hip  and hamstring strengthening as well as balance    PT Home Exercise Plan sit to stand, bridge, LT SLR and Lt sidelying abduction; 12/23 - prone hip extension, prone ham curl, sidelying clam, stand heel/toe raise; 12/27:  tandem stance and sidestep front of counter.; 1/3: quadruped UE then LE    Consulted and Agree with Plan of Care Patient             Patient will benefit from skilled therapeutic intervention in order to improve the following deficits and impairments:  Abnormal gait, Decreased activity tolerance, Decreased balance, Difficulty walking, Decreased strength, Pain  Visit Diagnosis: Muscle weakness (generalized)  Unsteadiness on feet     Problem List Patient Active  Problem List   Diagnosis Date Noted   History of herpes simplex infection 01/08/2021   Papanicolaou smear, as part of routine gynecological examination 01/08/2021   CVA (cerebral vascular accident) ([place]) 01/04/2021   Acute ischemic stroke ([place]) 01/03/2021   Hypokalemia 01/03/2021   Cerebral aneurysm 01/03/2021   Musculoskeletal pain 01/03/2021   Early satiety 11/27/2020   Chronic abdominal pain 07/13/2020   Port-A-Cath in place 06/23/2018   Malignant neoplasm of upper-inner quadrant of left breast in female, estrogen receptor positive ([place]) 04/16/2018   Essential hypertension 03/21/2015   High cholesterol 03/21/2015   Genital herpes 03/21/2015   Fecal urgency 03/21/2015   GERD (gastroesophageal reflux disease) 03/21/2015   Ihor Austin, LPTA/CLT; CBIS [phone]  Aldona Lento, PTA 01/23/2021, 5:19 PM  [facility] [place], [place], [address] Phone: [phone]   Fax:  [phone]  Name: NIJAE DOYEL MRN: [id number] Date of Birth: 1950/12/04

## 2021-01-24 ENCOUNTER — Telehealth: Payer: Self-pay | Admitting: Adult Health

## 2021-01-24 NOTE — Telephone Encounter (Signed)
Returned pt's call. No answer, no vm setup on cell phone. Called home number and reached pt. Two identifiers used. Relayed pap smear results. Pt confirmed understanding.

## 2021-01-24 NOTE — Telephone Encounter (Signed)
Patient is requesting her test results. Please advise.

## 2021-01-25 ENCOUNTER — Ambulatory Visit (HOSPITAL_COMMUNITY): Payer: Medicare Other

## 2021-01-26 ENCOUNTER — Other Ambulatory Visit: Payer: Self-pay

## 2021-01-26 ENCOUNTER — Ambulatory Visit: Payer: Medicaid Other | Admitting: Cardiology

## 2021-01-26 ENCOUNTER — Ambulatory Visit (HOSPITAL_COMMUNITY): Payer: Medicare Other | Admitting: Physical Therapy

## 2021-01-26 DIAGNOSIS — R29818 Other symptoms and signs involving the nervous system: Secondary | ICD-10-CM | POA: Diagnosis not present

## 2021-01-26 DIAGNOSIS — R2681 Unsteadiness on feet: Secondary | ICD-10-CM

## 2021-01-26 DIAGNOSIS — M6281 Muscle weakness (generalized): Secondary | ICD-10-CM

## 2021-01-26 NOTE — Therapy (Signed)
[facility] [address] [place], [place], [address] Phone: [phone]   Fax:  [phone]  Physical Therapy Treatment  Patient Details  Name: Kimberly Mccormick MRN: [id number] Date of Birth: May 19, 1950 Referring Provider (PT): Eulogio Bear   Encounter Date: 01/26/2021   PT End of Session - 01/26/21 1115     Visit Number 6    Number of Visits 12    Date for PT Re-Evaluation 02/20/21    Authorization Type UHC medicare    Progress Note Due on Visit 10    PT Start Time 4431    PT Stop Time 1125    PT Time Calculation (min) 40 min    Activity Tolerance Patient tolerated treatment well    Behavior During Therapy [facility] for tasks assessed/performed             Past Medical History:  Diagnosis Date   Anemia    iron def   Aneurysm ([place])    Breast cancer ([place])    left, Status post chemotherapy and XRT   CHF (congestive heart failure) ([place])    DDD (degenerative disc disease), lumbar    Essential hypertension    Essential tremor    Genital warts    GERD (gastroesophageal reflux disease)    History of anemia    History of renal insufficiency    Hyperlipidemia    Hypertension    Insomnia    Mixed hyperlipidemia    Osteoarthritis    Otitis media    right   Parkinson's disease ([place])    Peripheral neuropathy    d/t chemo   Sinus infection 01/04/2014   Stroke [facility])     Past Surgical History:  Procedure Laterality Date   APPENDECTOMY  1966   BIOPSY  08/29/2020   Procedure: BIOPSY;  Surgeon: Harvel Quale, MD;  [facility];  Service: Gastroenterology;;   BREAST LUMPECTOMY WITH RADIOACTIVE SEED AND SENTINEL LYMPH NODE BIOPSY Left 05/06/2018   Procedure: LEFT BREAST LUMPECTOMY WITH RADIOACTIVE SEED AND LEFT DEEP AXILLARY SENTINEL LYMPH NODE BIOPSY AND BLUE DYE INJECTION;  Surgeon: Fanny Skates, MD;  Location: [place];  Service: General;  Laterality: Left;   CATARACT EXTRACTION W/PHACO Left  01/10/2014   Procedure: CATARACT EXTRACTION PHACO AND INTRAOCULAR LENS PLACEMENT ; CDE:  4.94;  Surgeon: Williams Che, MD;  [facility];  Service: Ophthalmology;  Laterality: Left;   CESAREAN SECTION  1986   CHOLECYSTECTOMY     COLONOSCOPY WITH PROPOFOL N/A 07/25/2020   Procedure: COLONOSCOPY WITH PROPOFOL;  Surgeon: Harvel Quale, MD;  [facility];  Service: Gastroenterology;  Laterality: N/A;  10:55   ESOPHAGOGASTRODUODENOSCOPY (EGD) WITH PROPOFOL N/A 08/29/2020   Procedure: ESOPHAGOGASTRODUODENOSCOPY (EGD) WITH PROPOFOL;  Surgeon: Harvel Quale, MD;  [facility];  Service: Gastroenterology;  Laterality: N/A;  12:00   MYRINGOTOMY WITH TUBE PLACEMENT Right 02/04/2017   Procedure: REVISION OF RIGHT MYRINGOTOMY WITH TUBE PLACEMENT, WITH EXAM OF LEFT EAR;  Surgeon: Leta Baptist, MD;  Location: [place];  Service: ENT;  Laterality: Right;   NASAL SINUS SURGERY  2016   polypectomy   POLYPECTOMY  07/25/2020   Procedure: POLYPECTOMY;  Surgeon: Harvel Quale, MD;  [facility];  Service: Gastroenterology;;   POLYPECTOMY  08/29/2020   Procedure: POLYPECTOMY;  Surgeon: Harvel Quale, MD;  [facility];  Service: Gastroenterology;;   [facility] REMOVAL N/A 01/06/2019   Procedure: REMOVAL PORT-A-CATH;  Surgeon: Fanny Skates, MD;  Location: [place];  Service: General;  Laterality: N/A;   PORTACATH PLACEMENT Right 06/16/2018   Procedure: INSERTION PORT-A-CATH;  Surgeon: Fanny Skates, MD;  Location: [place];  Service: General;  Laterality: Right;   SUBMUCOSAL LIFTING INJECTION  08/29/2020   Procedure: SUBMUCOSAL LIFTING INJECTION;  Surgeon: Harvel Quale, MD;  [facility];  Service: Gastroenterology;;   TONSILLECTOMY  1970    There were no vitals filed for this visit.   Subjective Assessment - 01/26/21 1047     Subjective PT states  that she is having a little pain in her RT knee but this is normal. ( this is not what we are seeing pt for.    Pertinent History HTN, Lt breast cancer 03/24/2018 , Parkinson    Limitations Lifting;Standing;Walking;House hold activities    How long can you sit comfortably? no problem    How long can you stand comfortably? able to do normal ADL but has not tried to cook or stand for prolong period of time.    How long can you walk comfortably? walks with a quad cane now longest she has walked has been ten minutes.    [facility]    Currently in Pain? Yes    Pain Score 1     Pain Location Knee    Pain Descriptors / Indicators Aching    Pain Type Chronic pain    Pain Onset More than a month ago                               [facility] Adult PT Treatment/Exercise - 01/26/21 0001       Ambulation/Gait   Ambulation Distance (Feet) 370 Feet    Assistive device None    Stairs --    Stairs Assistance --    Stair Management Technique --    Height of Stairs --    Gait Comments 2' walk test      Knee/Hip Exercises: Machines for Strengthening   Other Machine leg press 2x 10 slow control with 4Pl      Knee/Hip Exercises: Standing   Other Standing Knee Exercises Paloff GTB with 1 foot on 12in step height 4x 10                 Balance Exercises - 01/26/21 0001       Balance Exercises: Standing   SLS with Vectors Solid surface;3 reps;10 secs    Sidestepping 2 reps;Theraband    Theraband Level (Sidestepping) Level 3 (Green)    Step Over Hurdles / Cones 6 and 12" x 2 RT    Marching Solid surface;Static;10 reps   raising opposite arm   Heel Raises Both;15 reps   combo with functional squat   Sit to Stand Standard surface   10 reps x 2 sets                 PT Short Term Goals - 01/26/21 1116       PT SHORT TERM GOAL #1   Title PT to be I in HEP to improve LT LE strength to allow LT knee pain to decrease to no greater than 5/10.     Time 3    Period Weeks    Status Achieved      PT SHORT TERM GOAL #2   Title PT to be walking in  her home without the use of an assistive device    Time 3    Period Weeks    Status Achieved      PT SHORT TERM GOAL #3   Title PT to be able to single leg stance on her left LE for 20 seconds to reduce risk of falling.    Time 3    Period Weeks    Status On-going               PT Long Term Goals - 01/26/21 1116       PT LONG TERM GOAL #1   Title PT to be I in an advance HEP to improve LT knee pain to no greater than 3/10.    Time 6    Period Weeks    Status Achieved    Target Date 02/20/21      PT LONG TERM GOAL #2   Title PT Lt LE strength to be at least 4/5 to allow pt to feel confident walking without an assistive device outside.    Time 6    Period Weeks    Status On-going      PT LONG TERM GOAL #3   Title Pt to have retturned to the [employer] at least 2x a week    Time 6    Period Weeks    Status On-going      PT LONG TERM GOAL #4   Title PT  to be able to single leg stance on her left leg for at least 40 seconds to reduce her risk of falling.    Time 6    Period Weeks    Status On-going                   Plan - 01/26/21 1116     Clinical Impression Statement Treatment focused on balance today with good tolerance.  Pt neeeded two rest breaks throughout  treatment time.  Pt improved in 2 minute walking test    Personal Factors and Comorbidities Comorbidity 3+    Comorbidities breast cancer, parkinson, HTN    Examination-Activity Limitations Carry;Lift;Locomotion Level;Reach Overhead;Squat;Stairs;Stand    Examination-Participation Restrictions Church;Cleaning;Community Activity;Laundry;Meal Prep;Shop;Yard Work    Stability/Clinical Decision Making Evolving/Moderate complexity    Rehab Potential Good    PT Frequency 2x / week    PT Duration 6 weeks    PT Treatment/Interventions Therapeutic activities;Therapeutic exercise;Balance  training;Patient/family education    PT Next Visit Plan Begin reciprocal steps.  ContinueProgressing hip  and hamstring strengthening as well as balance    PT Home Exercise Plan sit to stand, bridge, LT SLR and Lt sidelying abduction; 12/23 - prone hip extension, prone ham curl, sidelying clam, stand heel/toe raise; 12/27:  tandem stance and sidestep front of counter.; 1/3: quadruped UE then LE    Consulted and Agree with Plan of Care Patient             Patient will benefit from skilled therapeutic intervention in order to improve the following deficits and impairments:  Abnormal gait, Decreased activity tolerance, Decreased balance, Difficulty walking, Decreased strength, Pain  Visit Diagnosis: Muscle weakness (generalized)  Unsteadiness on feet     Problem List Patient Active Problem List   Diagnosis Date Noted   History of herpes simplex infection 01/08/2021   Papanicolaou smear, as part of routine gynecological examination 01/08/2021   CVA (cerebral vascular accident) ([place]) 01/04/2021   Acute ischemic stroke ([place]) 01/03/2021   Hypokalemia 01/03/2021   Cerebral aneurysm  01/03/2021   Musculoskeletal pain 01/03/2021   Early satiety 11/27/2020   Chronic abdominal pain 07/13/2020   Port-A-Cath in place 06/23/2018   Malignant neoplasm of upper-inner quadrant of left breast in female, estrogen receptor positive ([place]) 04/16/2018   Essential hypertension 03/21/2015   High cholesterol 03/21/2015   Genital herpes 03/21/2015   Fecal urgency 03/21/2015   GERD (gastroesophageal reflux disease) 03/21/2015   Rayetta Humphrey, PT CLT [phone]  01/26/2021, 11:30 AM  [facility] [address] [place], [place], [address] Phone: [phone]   Fax:  [phone]  Name: Kimberly Mccormick MRN: [id number] Date of Birth: [birth year]

## 2021-01-31 ENCOUNTER — Ambulatory Visit (HOSPITAL_COMMUNITY): Payer: Medicare Other

## 2021-02-02 ENCOUNTER — Ambulatory Visit (HOSPITAL_COMMUNITY): Payer: Medicare Other | Admitting: Occupational Therapy

## 2021-02-02 ENCOUNTER — Ambulatory Visit (HOSPITAL_COMMUNITY): Payer: Medicare Other | Admitting: Physical Therapy

## 2021-02-02 ENCOUNTER — Other Ambulatory Visit: Payer: Self-pay

## 2021-02-02 ENCOUNTER — Encounter: Payer: Self-pay | Admitting: Hematology and Oncology

## 2021-02-02 ENCOUNTER — Encounter (HOSPITAL_COMMUNITY): Payer: Self-pay | Admitting: Occupational Therapy

## 2021-02-02 DIAGNOSIS — M6281 Muscle weakness (generalized): Secondary | ICD-10-CM | POA: Diagnosis not present

## 2021-02-02 DIAGNOSIS — R29818 Other symptoms and signs involving the nervous system: Secondary | ICD-10-CM

## 2021-02-02 DIAGNOSIS — R2681 Unsteadiness on feet: Secondary | ICD-10-CM | POA: Diagnosis not present

## 2021-02-02 NOTE — Patient Instructions (Signed)

## 2021-02-02 NOTE — Therapy (Signed)
[facility] [address] [place], [place], [address] Phone: [phone]   Fax:  [phone]  Physical Therapy Treatment  Patient Details  Name: Kimberly Mccormick MRN: [id number] Date of Birth: [birth year] Referring Provider (PT): Eulogio Bear   Encounter Date: 02/02/2021   PT End of Session - 02/02/21 1135     Visit Number 7    Number of Visits 12    Date for PT Re-Evaluation 02/20/21    Authorization Type UHC medicare    Progress Note Due on Visit 10    PT Start Time 1053    PT Stop Time 1132    PT Time Calculation (min) 39 min    Activity Tolerance Patient tolerated treatment well    Behavior During Therapy [facility] for tasks assessed/performed             Past Medical History:  Diagnosis Date   Anemia    iron def   Aneurysm ([place])    Breast cancer ([place])    left, Status post chemotherapy and XRT   CHF (congestive heart failure) ([place])    DDD (degenerative disc disease), lumbar    Essential hypertension    Essential tremor    Genital warts    GERD (gastroesophageal reflux disease)    History of anemia    History of renal insufficiency    Hyperlipidemia    Hypertension    Insomnia    Mixed hyperlipidemia    Osteoarthritis    Otitis media    right   Parkinson's disease ([place])    Peripheral neuropathy    d/t chemo   Sinus infection 01/04/2014   Stroke [facility])     Past Surgical History:  Procedure Laterality Date   APPENDECTOMY  1966   BIOPSY  08/29/2020   Procedure: BIOPSY;  Surgeon: Harvel Quale, MD;  [facility];  Service: Gastroenterology;;   BREAST LUMPECTOMY WITH RADIOACTIVE SEED AND SENTINEL LYMPH NODE BIOPSY Left 05/06/2018   Procedure: LEFT BREAST LUMPECTOMY WITH RADIOACTIVE SEED AND LEFT DEEP AXILLARY SENTINEL LYMPH NODE BIOPSY AND BLUE DYE INJECTION;  Surgeon: Fanny Skates, MD;  Location: [place];  Service: General;  Laterality: Left;   CATARACT EXTRACTION W/PHACO Left  01/10/2014   Procedure: CATARACT EXTRACTION PHACO AND INTRAOCULAR LENS PLACEMENT ; CDE:  4.94;  Surgeon: Williams Che, MD;  [facility];  Service: Ophthalmology;  Laterality: Left;   CESAREAN SECTION  1986   CHOLECYSTECTOMY     COLONOSCOPY WITH PROPOFOL N/A 07/25/2020   Procedure: COLONOSCOPY WITH PROPOFOL;  Surgeon: Harvel Quale, MD;  [facility];  Service: Gastroenterology;  Laterality: N/A;  10:55   ESOPHAGOGASTRODUODENOSCOPY (EGD) WITH PROPOFOL N/A 08/29/2020   Procedure: ESOPHAGOGASTRODUODENOSCOPY (EGD) WITH PROPOFOL;  Surgeon: Harvel Quale, MD;  [facility];  Service: Gastroenterology;  Laterality: N/A;  12:00   MYRINGOTOMY WITH TUBE PLACEMENT Right 02/04/2017   Procedure: REVISION OF RIGHT MYRINGOTOMY WITH TUBE PLACEMENT, WITH EXAM OF LEFT EAR;  Surgeon: Leta Baptist, MD;  Location: [place];  Service: ENT;  Laterality: Right;   NASAL SINUS SURGERY  2016   polypectomy   POLYPECTOMY  07/25/2020   Procedure: POLYPECTOMY;  Surgeon: Harvel Quale, MD;  [facility];  Service: Gastroenterology;;   POLYPECTOMY  08/29/2020   Procedure: POLYPECTOMY;  Surgeon: Harvel Quale, MD;  [facility];  Service: Gastroenterology;;   [facility] REMOVAL N/A 01/06/2019   Procedure: REMOVAL PORT-A-CATH;  Surgeon: Fanny Skates, MD;  Location: [place];  Service: General;  Laterality: N/A;   PORTACATH PLACEMENT Right 06/16/2018   Procedure: INSERTION PORT-A-CATH;  Surgeon: Fanny Skates, MD;  Location: [place];  Service: General;  Laterality: Right;   SUBMUCOSAL LIFTING INJECTION  08/29/2020   Procedure: SUBMUCOSAL LIFTING INJECTION;  Surgeon: Harvel Quale, MD;  [facility];  Service: Gastroenterology;;   TONSILLECTOMY  1970    There were no vitals filed for this visit.   Subjective Assessment - 02/02/21 1057     Subjective Pt states  her back is hurting today at 2/10.  No other issues    Currently in Pain? Yes    Pain Score 2     Pain Location Back    Pain Orientation Right;Left                               [facility] Adult PT Treatment/Exercise - 02/02/21 0001       Knee/Hip Exercises: Machines for Strengthening   Other Machine leg press 3x 10 slow control with 3Pl      Knee/Hip Exercises: Standing   Hip Flexion Both;2 sets;10 reps;Knee bent    Hip Flexion Limitations toe tapping alternating 12in step height 30" x 2    Functional Squat 10 reps    Functional Squat Limitations cueing for mechanics, front of chair    SLS with Vectors 5x 5" BLE intermittent HHA    Other Standing Knee Exercises sidestepping x 2RT large steps, no weights or TBand                 Balance Exercises - 02/02/21 0001       Balance Exercises: Standing   Step Over Hurdles / Cones 6 and 12" x 2 RT    Sit to Stand Standard surface                  PT Short Term Goals - 01/26/21 1116       PT SHORT TERM GOAL #1   Title PT to be I in HEP to improve LT LE strength to allow LT knee pain to decrease to no greater than 5/10.    Time 3    Period Weeks    Status Achieved      PT SHORT TERM GOAL #2   Title PT to be walking in her home without the use of an assistive device    Time 3    Period Weeks    Status Achieved      PT SHORT TERM GOAL #3   Title PT to be able to single leg stance on her left LE for 20 seconds to reduce risk of falling.    Time 3    Period Weeks    Status On-going               PT Long Term Goals - 01/26/21 1116       PT LONG TERM GOAL #1   Title PT to be I in an advance HEP to improve LT knee pain to no greater than 3/10.    Time 6    Period Weeks    Status Achieved    Target Date 02/20/21      PT LONG TERM GOAL #2   Title PT Lt LE strength to be at least 4/5 to allow pt to feel confident walking without an assistive device  outside.    Time 6    Period Weeks     Status On-going      PT LONG TERM GOAL #3   Title Pt to have retturned to the [employer] at least 2x a week    Time 6    Period Weeks    Status On-going      PT LONG TERM GOAL #4   Title PT  to be able to single leg stance on her left leg for at least 40 seconds to reduce her risk of falling.    Time 6    Period Weeks    Status On-going                   Plan - 02/02/21 1135     Clinical Impression Statement Pt was late for appt today.  Continued to work on LE strengthening and stability as well as core activation with activities.  Pt with poor form completing squats requiring max cues as well as postural cues.  Several rest breaks needed today due to fatigue.  Reduced weight on leg press per request as unable to control eccentric descent.    Personal Factors and Comorbidities Comorbidity 3+    Comorbidities breast cancer, parkinson, HTN    Examination-Activity Limitations Carry;Lift;Locomotion Level;Reach Overhead;Squat;Stairs;Stand    Examination-Participation Restrictions Church;Cleaning;Community Activity;Laundry;Meal Prep;Shop;Yard Work    Stability/Clinical Decision Making Evolving/Moderate complexity    Rehab Potential Good    PT Frequency 2x / week    PT Duration 6 weeks    PT Treatment/Interventions Therapeutic activities;Therapeutic exercise;Balance training;Patient/family education    PT Next Visit Plan Begin reciprocal steps next session.  ContinueProgressing hip  and hamstring strengthening as well as balance    PT Home Exercise Plan sit to stand, bridge, LT SLR and Lt sidelying abduction; 12/23 - prone hip extension, prone ham curl, sidelying clam, stand heel/toe raise; 12/27:  tandem stance and sidestep front of counter.; 1/3: quadruped UE then LE    Consulted and Agree with Plan of Care Patient             Patient will benefit from skilled therapeutic intervention in order to improve the following deficits and impairments:  Abnormal gait, Decreased activity  tolerance, Decreased balance, Difficulty walking, Decreased strength, Pain  Visit Diagnosis: Muscle weakness (generalized)  Unsteadiness on feet     Problem List Patient Active Problem List   Diagnosis Date Noted   History of herpes simplex infection 01/08/2021   Papanicolaou smear, as part of routine gynecological examination 01/08/2021   CVA (cerebral vascular accident) ([place]) 01/04/2021   Acute ischemic stroke ([place]) 01/03/2021   Hypokalemia 01/03/2021   Cerebral aneurysm 01/03/2021   Musculoskeletal pain 01/03/2021   Early satiety 11/27/2020   Chronic abdominal pain 07/13/2020   Port-A-Cath in place 06/23/2018   Malignant neoplasm of upper-inner quadrant of left breast in female, estrogen receptor positive ([place]) 04/16/2018   Essential hypertension 03/21/2015   High cholesterol 03/21/2015   Genital herpes 03/21/2015   Fecal urgency 03/21/2015   GERD (gastroesophageal reflux disease) 03/21/2015   Teena Irani, PTA/CLT, WTA [phone]  Teena Irani, PTA 02/02/2021, 11:38 AM  [facility] [place], [place], [address] Phone: [phone]   Fax:  [phone]  Name: Kimberly Mccormick MRN: [id number] Date of Birth: 19-May-1950

## 2021-02-02 NOTE — Therapy (Signed)
[facility] [address] [place], [place], [address] Phone: [phone]   Fax:  [phone]  Occupational Therapy Evaluation  Patient Details  Name: Kimberly Mccormick MRN: [id number] Date of Birth: 11-04-1950 Referring Provider (OT): Dyanne Carrel, NP   Encounter Date: 02/02/2021   OT End of Session - 02/02/21 1200     Visit Number 1    Number of Visits 1    Date for OT Re-Evaluation 02/05/21    Authorization Type 1) UHC 2) Medicaid    OT Start Time 1133    OT Stop Time 43    OT Time Calculation (min) 22 min    Activity Tolerance Patient tolerated treatment well    Behavior During Therapy [facility] for tasks assessed/performed             Past Medical History:  Diagnosis Date   Anemia    iron def   Aneurysm ([place])    Breast cancer ([place])    left, Status post chemotherapy and XRT   CHF (congestive heart failure) ([place])    DDD (degenerative disc disease), lumbar    Essential hypertension    Essential tremor    Genital warts    GERD (gastroesophageal reflux disease)    History of anemia    History of renal insufficiency    Hyperlipidemia    Hypertension    Insomnia    Mixed hyperlipidemia    Osteoarthritis    Otitis media    right   Parkinson's disease ([place])    Peripheral neuropathy    d/t chemo   Sinus infection 01/04/2014   Stroke [facility])     Past Surgical History:  Procedure Laterality Date   APPENDECTOMY  1966   BIOPSY  08/29/2020   Procedure: BIOPSY;  Surgeon: Harvel Quale, MD;  [facility];  Service: Gastroenterology;;   BREAST LUMPECTOMY WITH RADIOACTIVE SEED AND SENTINEL LYMPH NODE BIOPSY Left 05/06/2018   Procedure: LEFT BREAST LUMPECTOMY WITH RADIOACTIVE SEED AND LEFT DEEP AXILLARY SENTINEL LYMPH NODE BIOPSY AND BLUE DYE INJECTION;  Surgeon: Fanny Skates, MD;  Location: [place];  Service: General;  Laterality: Left;   CATARACT EXTRACTION W/PHACO Left 01/10/2014    Procedure: CATARACT EXTRACTION PHACO AND INTRAOCULAR LENS PLACEMENT ; CDE:  4.94;  Surgeon: Williams Che, MD;  [facility];  Service: Ophthalmology;  Laterality: Left;   CESAREAN SECTION  1986   CHOLECYSTECTOMY     COLONOSCOPY WITH PROPOFOL N/A 07/25/2020   Procedure: COLONOSCOPY WITH PROPOFOL;  Surgeon: Harvel Quale, MD;  [facility];  Service: Gastroenterology;  Laterality: N/A;  10:55   ESOPHAGOGASTRODUODENOSCOPY (EGD) WITH PROPOFOL N/A 08/29/2020   Procedure: ESOPHAGOGASTRODUODENOSCOPY (EGD) WITH PROPOFOL;  Surgeon: Harvel Quale, MD;  [facility];  Service: Gastroenterology;  Laterality: N/A;  12:00   MYRINGOTOMY WITH TUBE PLACEMENT Right 02/04/2017   Procedure: REVISION OF RIGHT MYRINGOTOMY WITH TUBE PLACEMENT, WITH EXAM OF LEFT EAR;  Surgeon: Leta Baptist, MD;  Location: [place];  Service: ENT;  Laterality: Right;   NASAL SINUS SURGERY  2016   polypectomy   POLYPECTOMY  07/25/2020   Procedure: POLYPECTOMY;  Surgeon: Harvel Quale, MD;  [facility];  Service: Gastroenterology;;   POLYPECTOMY  08/29/2020   Procedure: POLYPECTOMY;  Surgeon: Harvel Quale, MD;  [facility];  Service: Gastroenterology;;   [facility] REMOVAL N/A 01/06/2019   Procedure: REMOVAL PORT-A-CATH;  Surgeon: Fanny Skates, MD;  Location: [place];  Service: General;  Laterality: N/A;   PORTACATH PLACEMENT Right 06/16/2018   Procedure: INSERTION PORT-A-CATH;  Surgeon: Fanny Skates, MD;  Location: [place];  Service: General;  Laterality: Right;   SUBMUCOSAL LIFTING INJECTION  08/29/2020   Procedure: SUBMUCOSAL LIFTING INJECTION;  Surgeon: Harvel Quale, MD;  [facility];  Service: Gastroenterology;;   TONSILLECTOMY  1970    There were no vitals filed for this visit.   Subjective Assessment - 02/02/21 1149     Subjective  S: I think I'm doing ok.     Pertinent History Pt is a 71 y/o female s/p right CVA on 01/03/21 with LUE weakness noted during acute stay. Pt was referred to occupational therapy for evaluation and treatment by Dyanne Carrel, NP.    Limitations Pt with parkinson's dx    Special Tests DASH: 9.09    Patient Stated Goals To drive.    Currently in Pain? No/denies               [facility] OT Assessment - 02/02/21 1131       Assessment   Medical Diagnosis s/p R CVA    Referring Provider (OT) Dyanne Carrel, NP    Onset Date/Surgical Date 01/03/21    Hand Dominance Right    Next MD Visit February    Prior Therapy Acute Rehab      Precautions   Precautions None      Restrictions   Weight Bearing Restrictions No      Balance Screen   Has the patient fallen in the past 6 months No      Prior Function   Level of Independence Independent    Vocation Retired    Leisure watching TV, dancing, listening to music, walking, shopping      ADL   ADL comments Pt is having difficulty with buttoning a shirt. Pt with general fatigue.      Written Expression   Dominant Hand Right      Cognition   Overall Cognitive Status Within Functional Limits for tasks assessed      Observation/Other Assessments   Other Surveys  Select    Quick DASH  9.09      Coordination   9 Hole Peg Test Right;Left    Right 9 Hole Peg Test 20.35"    Left 9 Hole Peg Test 27.70"      ROM / Strength   AROM / PROM / Strength Strength      Strength   Overall Strength Comments Pt's LUE strength is 5/5 throughout (equivalent to RUE)    Strength Assessment Site Hand    Right/Left hand Right;Left    Right Hand Gross Grasp Functional    Right Hand Grip (lbs) 38    Right Hand Lateral Pinch 15 lbs    Right Hand 3 Point Pinch 10 lbs    Left Hand Gross Grasp Functional    Left Hand Grip (lbs) 22    Left Hand Lateral Pinch 12 lbs    Left Hand 3 Point Pinch 9 lbs                 [name] - 02/02/21 1141     Open a tight or new jar Mild  difficulty    Do heavy household chores (wash walls, wash floors) No difficulty    Carry a shopping bag or briefcase No difficulty    Wash your back No difficulty  Use a knife to cut food No difficulty    Recreational activities in which you take some force or impact through your arm, shoulder, or hand (golf, hammering, tennis) Moderate difficulty    During the past week, to what extent has your arm, shoulder or hand problem interfered with your normal social activities with family, friends, neighbors, or groups? Not at all    During the past week, to what extent has your arm, shoulder or hand problem limited your work or other regular daily activities Not at all    Arm, shoulder, or hand pain. Mild    Tingling (pins and needles) in your arm, shoulder, or hand None    Difficulty Sleeping No difficulty    DASH Score 9.09 %                         OT Education - 02/02/21 1149     Education Details red theraputty for grip and pinch    Person(s) Educated Patient    Methods Explanation;Demonstration;Handout    Comprehension Verbalized understanding;Returned demonstration              OT Short Term Goals - 02/02/21 1203       OT SHORT TERM GOAL #1   Title Pt will be provided with and educated on HEP to improve left grip and pinch strength.    Time 1    Period Days    Status Achieved    Target Date 02/05/21                      Plan - 02/02/21 1200     Clinical Impression Statement A: Pt is a 71 y/o female s/p right CVA presenting for evaluation of LUE weakness. Pt demonstrates LUE strength 5/5, equivalent to RUE. Pt does have mildly decreased grip strength on the left side, however reports no difficulty with ADLs and gripping objects. Discussed functioning with pt who feels she is near her baseline with her LUE. Provided HEP for grip and pinch strengthening to work on daily.    OT Occupational Profile and History Problem Focused Assessment - Including  review of records relating to presenting problem    Occupational performance deficits (Please refer to evaluation for details): ADL's;IADL's    Body Structure / Function / Physical Skills Strength    Rehab Potential Good    Clinical Decision Making Limited treatment options, no task modification necessary    Comorbidities Affecting Occupational Performance: None    Modification or Assistance to Complete Evaluation  No modification of tasks or assist necessary to complete eval    OT Frequency One time visit    OT Treatment/Interventions Patient/family education    Plan P: Provided HEP for grip and pinch strengthening. No further OT services required at this time as pt is using LUE as non-dominant without difficulty    OT Home Exercise Plan eval: red theraputty grip and pinch strengthening    Consulted and Agree with Plan of Care Patient             Patient will benefit from skilled therapeutic intervention in order to improve the following deficits and impairments:   Body Structure / Function / Physical Skills: Strength       Visit Diagnosis: Other symptoms and signs involving the nervous system    Problem List Patient Active Problem List   Diagnosis Date Noted   History of herpes simplex infection 01/08/2021   Papanicolaou  smear, as part of routine gynecological examination 01/08/2021   CVA (cerebral vascular accident) ([place]) 01/04/2021   Acute ischemic stroke ([place]) 01/03/2021   Hypokalemia 01/03/2021   Cerebral aneurysm 01/03/2021   Musculoskeletal pain 01/03/2021   Early satiety 11/27/2020   Chronic abdominal pain 07/13/2020   Port-A-Cath in place 06/23/2018   Malignant neoplasm of upper-inner quadrant of left breast in female, estrogen receptor positive ([place]) 04/16/2018   Essential hypertension 03/21/2015   High cholesterol 03/21/2015   Genital herpes 03/21/2015   Fecal urgency 03/21/2015   GERD (gastroesophageal reflux disease) 03/21/2015    Guadelupe Sabin, OTR/L   [phone] 02/02/2021, 12:03 PM  [facility] [address] [place], [place], [address] Phone: [phone]   Fax:  [phone]  Name: Kimberly Mccormick MRN: [id number] Date of Birth: 26-Nov-1950

## 2021-02-04 ENCOUNTER — Encounter (HOSPITAL_COMMUNITY): Payer: Self-pay

## 2021-02-04 ENCOUNTER — Emergency Department (HOSPITAL_COMMUNITY)
Admission: EM | Admit: 2021-02-04 | Discharge: 2021-02-04 | Disposition: A | Discharge status: Home / Self Care | Payer: Medicare Other | Attending: Emergency Medicine | Admitting: Emergency Medicine

## 2021-02-04 ENCOUNTER — Other Ambulatory Visit: Payer: Self-pay

## 2021-02-04 ENCOUNTER — Emergency Department (HOSPITAL_COMMUNITY): Payer: Medicare Other

## 2021-02-04 DIAGNOSIS — K429 Umbilical hernia without obstruction or gangrene: Secondary | ICD-10-CM | POA: Diagnosis not present

## 2021-02-04 DIAGNOSIS — D509 Iron deficiency anemia, unspecified: Secondary | ICD-10-CM | POA: Diagnosis not present

## 2021-02-04 DIAGNOSIS — N132 Hydronephrosis with renal and ureteral calculous obstruction: Secondary | ICD-10-CM | POA: Insufficient documentation

## 2021-02-04 DIAGNOSIS — E7849 Other hyperlipidemia: Secondary | ICD-10-CM | POA: Diagnosis not present

## 2021-02-04 DIAGNOSIS — Z7982 Long term (current) use of aspirin: Secondary | ICD-10-CM | POA: Diagnosis not present

## 2021-02-04 DIAGNOSIS — C50912 Malignant neoplasm of unspecified site of left female breast: Secondary | ICD-10-CM | POA: Diagnosis not present

## 2021-02-04 DIAGNOSIS — I639 Cerebral infarction, unspecified: Secondary | ICD-10-CM | POA: Diagnosis not present

## 2021-02-04 DIAGNOSIS — N200 Calculus of kidney: Secondary | ICD-10-CM | POA: Diagnosis not present

## 2021-02-04 DIAGNOSIS — I10 Essential (primary) hypertension: Secondary | ICD-10-CM | POA: Diagnosis not present

## 2021-02-04 DIAGNOSIS — Z8673 Personal history of transient ischemic attack (TIA), and cerebral infarction without residual deficits: Secondary | ICD-10-CM | POA: Diagnosis not present

## 2021-02-04 DIAGNOSIS — K7689 Other specified diseases of liver: Secondary | ICD-10-CM | POA: Diagnosis not present

## 2021-02-04 DIAGNOSIS — R1030 Lower abdominal pain, unspecified: Secondary | ICD-10-CM | POA: Diagnosis present

## 2021-02-04 DIAGNOSIS — I671 Cerebral aneurysm, nonruptured: Secondary | ICD-10-CM | POA: Diagnosis not present

## 2021-02-04 DIAGNOSIS — K8689 Other specified diseases of pancreas: Secondary | ICD-10-CM | POA: Diagnosis not present

## 2021-02-04 DIAGNOSIS — N139 Obstructive and reflux uropathy, unspecified: Secondary | ICD-10-CM | POA: Diagnosis not present

## 2021-02-04 DIAGNOSIS — G20 Parkinson's disease: Secondary | ICD-10-CM | POA: Diagnosis not present

## 2021-02-04 DIAGNOSIS — N201 Calculus of ureter: Secondary | ICD-10-CM

## 2021-02-04 LAB — URINALYSIS, MICROSCOPIC (REFLEX)

## 2021-02-04 LAB — CBC WITH DIFFERENTIAL/PLATELET
Abs Immature Granulocytes: 0.02 10*3/uL (ref 0.00–0.07)
Basophils Absolute: 0 10*3/uL (ref 0.0–0.1)
Basophils Relative: 1 %
Eosinophils Absolute: 0.2 10*3/uL (ref 0.0–0.5)
Eosinophils Relative: 3 %
HCT: 37.5 % (ref 36.0–46.0)
Hemoglobin: 12.4 g/dL (ref 12.0–15.0)
Immature Granulocytes: 0 %
Lymphocytes Relative: 34 %
Lymphs Abs: 2 10*3/uL (ref 0.7–4.0)
MCH: 27.9 pg (ref 26.0–34.0)
MCHC: 33.1 g/dL (ref 30.0–36.0)
MCV: 84.3 fL (ref 80.0–100.0)
Monocytes Absolute: 0.4 10*3/uL (ref 0.1–1.0)
Monocytes Relative: 7 %
Neutro Abs: 3.3 10*3/uL (ref 1.7–7.7)
Neutrophils Relative %: 55 %
Platelets: 215 10*3/uL (ref 150–400)
RBC: 4.45 MIL/uL (ref 3.87–5.11)
RDW: 13.2 % (ref 11.5–15.5)
WBC: 5.9 10*3/uL (ref 4.0–10.5)
nRBC: 0 % (ref 0.0–0.2)

## 2021-02-04 LAB — URINALYSIS, ROUTINE W REFLEX MICROSCOPIC
Bilirubin Urine: NEGATIVE
Glucose, UA: NEGATIVE mg/dL
Ketones, ur: NEGATIVE mg/dL
Leukocytes,Ua: NEGATIVE
Nitrite: NEGATIVE
Protein, ur: NEGATIVE mg/dL
Specific Gravity, Urine: 1.015 (ref 1.005–1.030)
pH: 7 (ref 5.0–8.0)

## 2021-02-04 LAB — BASIC METABOLIC PANEL
Anion gap: 10 (ref 5–15)
BUN: 16 mg/dL (ref 8–23)
CO2: 25 mmol/L (ref 22–32)
Calcium: 9.3 mg/dL (ref 8.9–10.3)
Chloride: 107 mmol/L (ref 98–111)
Creatinine, Ser: 0.82 mg/dL (ref 0.44–1.00)
GFR, Estimated: >60 mL/min (ref >60)
Glucose, Bld: 119 mg/dL — ABNORMAL HIGH (ref 70–99)
Potassium: 3.3 mmol/L — ABNORMAL LOW (ref 3.5–5.1)
Sodium: 142 mmol/L (ref 135–145)

## 2021-02-04 IMAGING — CT CT RENAL STONE PROTOCOL
2 of 4 series · 15 of 46 positions shown, 17 images · non-contrast
Comparison: CTs with contrast [DATE], [DATE]

CLINICAL DATA: Right flank pain.



[Series 2: axial st · axial · 0.66mm/px · z∈[-296,+129]mm · 12 of 95 slices shown, 14 images]
[im 5/95  soft-tissue]
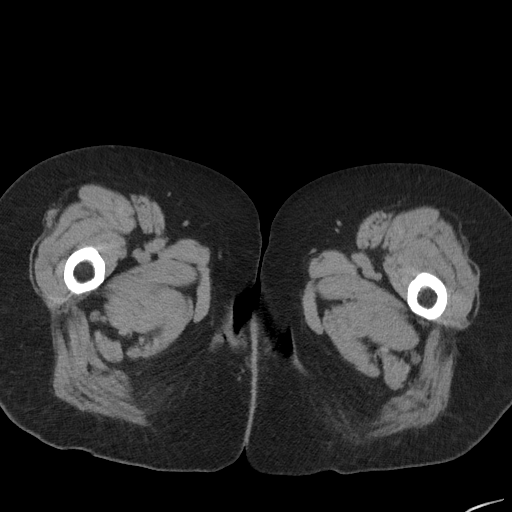
[im 5/95  bone]
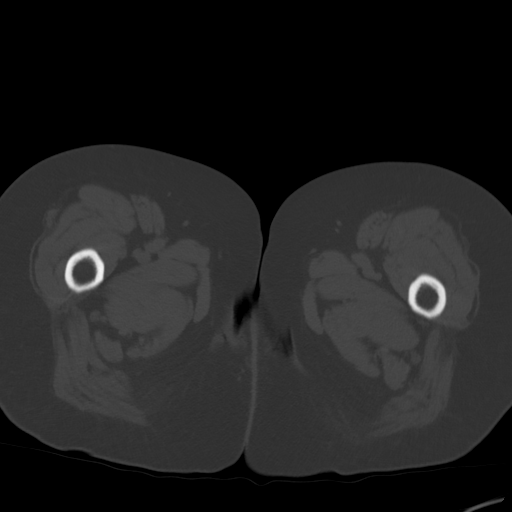
[im 15/95  soft-tissue]
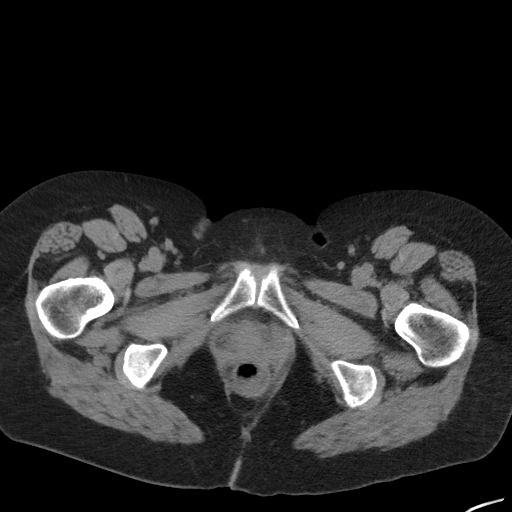
[im 19/95  soft-tissue]
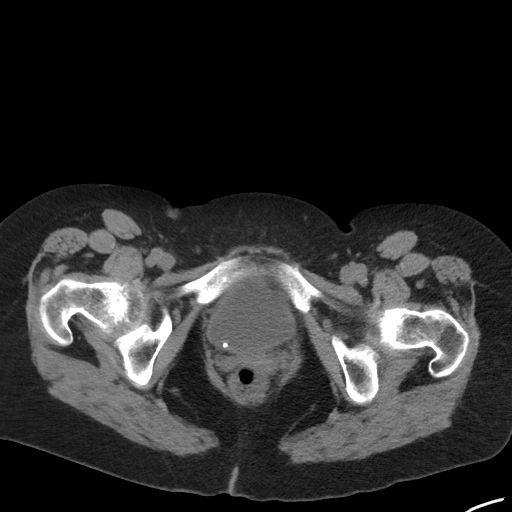
[im 29/95  soft-tissue]
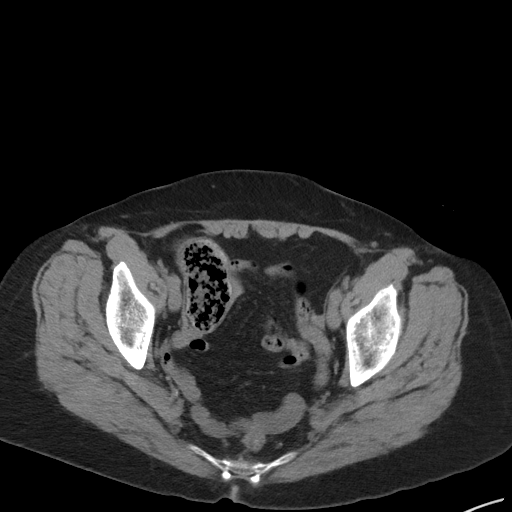
[im 38/95  soft-tissue]
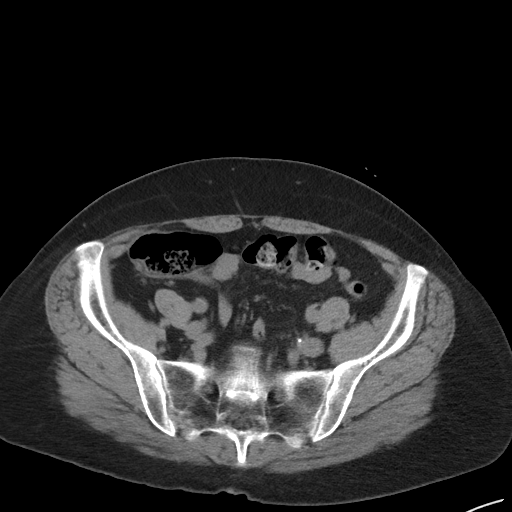
[im 43/95  soft-tissue]
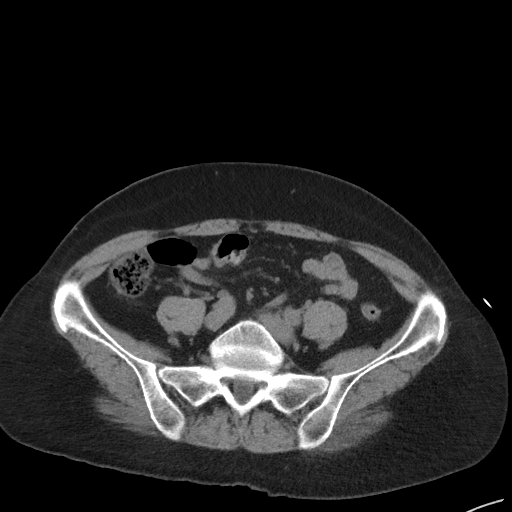
[im 52/95  soft-tissue]
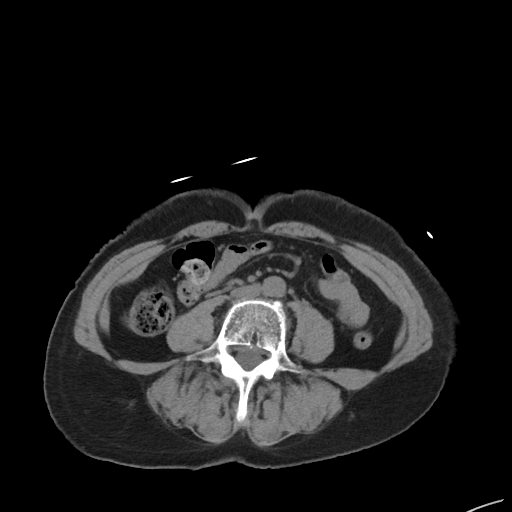
[im 57/95  soft-tissue]
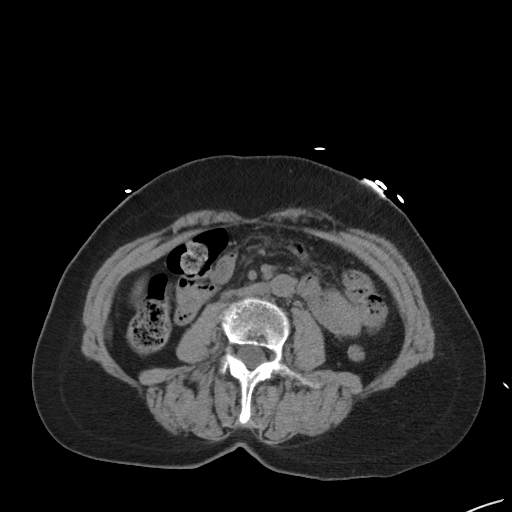
[im 66/95  soft-tissue]
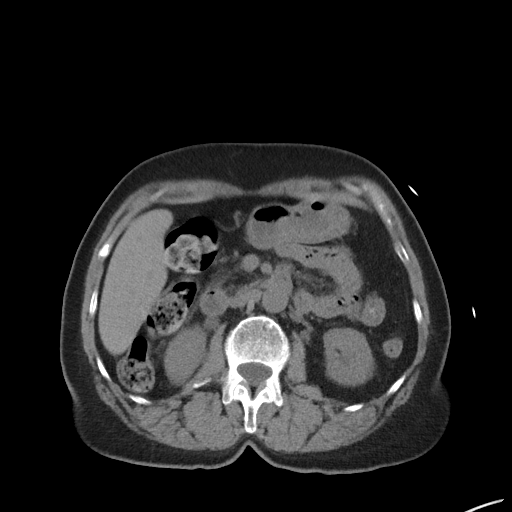
[im 66/95  bone]
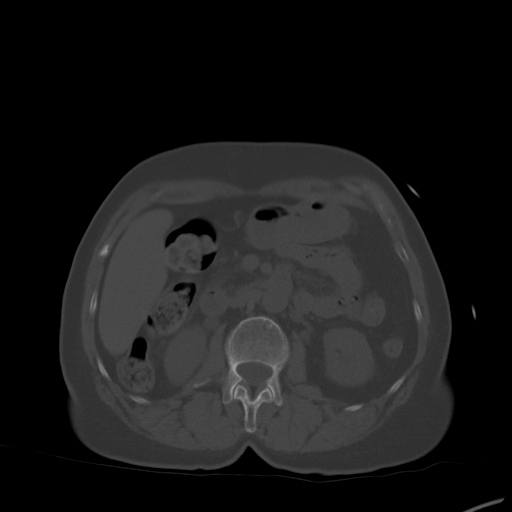
[im 76/95  soft-tissue]
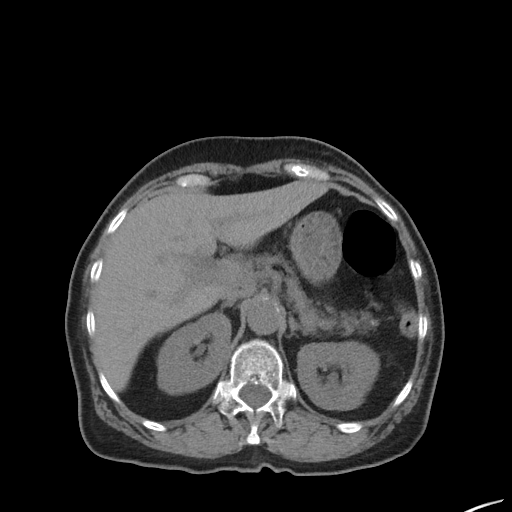
[im 80/95  soft-tissue]
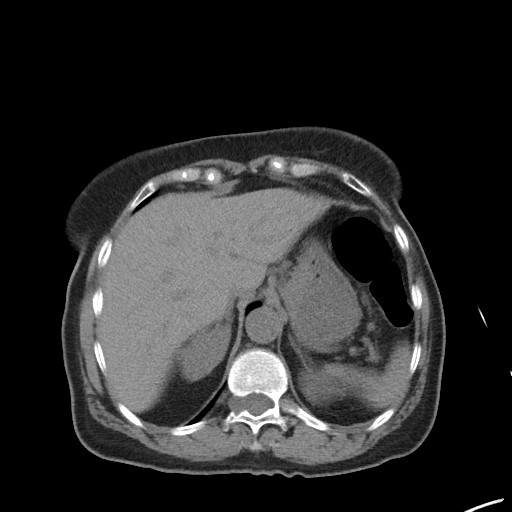
[im 90/95  soft-tissue]
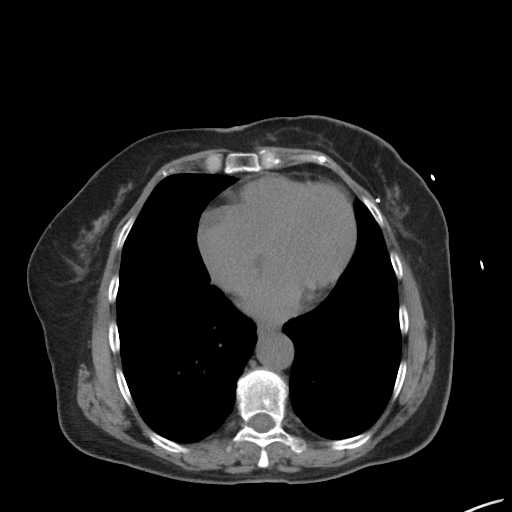

[Series 5: coronal st · coronal · 0.71mm/px · 3 of 83 slices shown]
[im 28/83  soft-tissue]
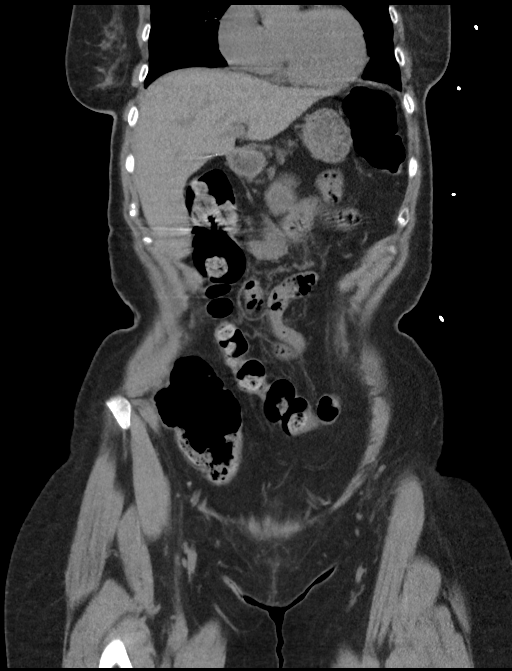
[im 37/83  soft-tissue]
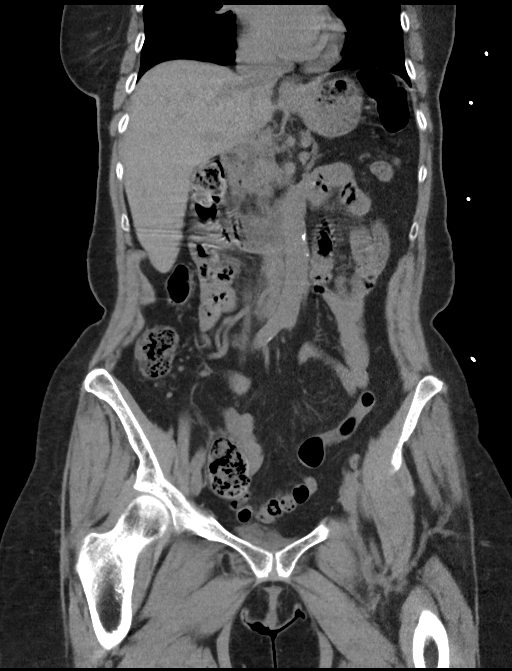
[im 46/83  soft-tissue]
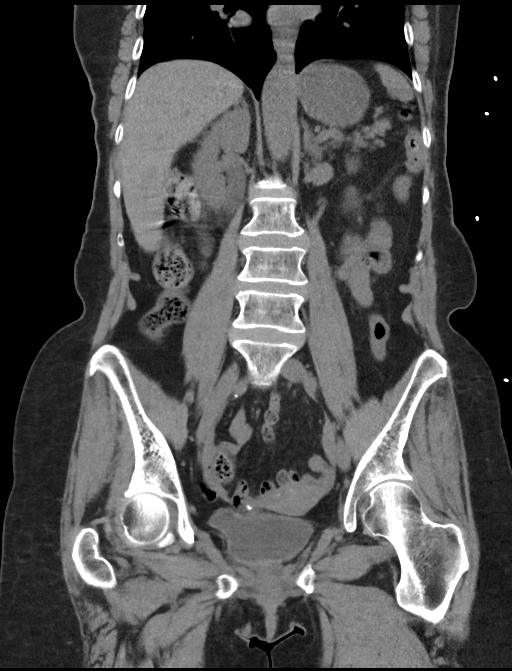

[15 of 46 positions shown; findings below may reference images not displayed]

FINDINGS: Lower chest: The heart is slightly enlarged. There is no pericardial
fluid. Lung bases are clear of infiltrates with mild posterior
atelectasis and a stable chronic 6 mm left lower lobe nodule
laterally.

Hepatobiliary: Small stable cysts again noted in the left hepatic
dome. Otherwise unremarkable unenhanced liver. Gallbladder is
absent, with no biliary dilatation.

Pancreas: Atrophic and otherwise unremarkable.

Spleen: No focal abnormality or splenomegaly.

Adrenals/Urinary Tract: There is no adrenal mass. There are small
renal cysts. 3 mm nonobstructing caliceal stone inferior pole right
kidney. There previously was a 3 mm caliceal stone in the right
upper pole which is now lodged in the right UVJ, with mild upstream
hydroureteronephrosis. Left ureter is clear. There is no bladder
thickening.

Stomach/Bowel: No dilatation or wall thickening. An appendix is not
seen in this patient. Moderate stool retention ascending and
transverse colon. Sigmoid diverticula without diverticulitis.

Vascular/Lymphatic: Aortic atherosclerosis. No enlarged abdominal or
pelvic lymph nodes.

Reproductive: The uterus is intact. There is a small calcified
fibroid. There are bilateral BTL changes with no ovarian
enlargement.

Other: There is no free air, hemorrhage or fluid. Small umbilical
fat hernia.

Musculoskeletal: No acute or significant osseous findings.
IMPRESSION: 1. 3 mm right UVJ stone with mild obstructive uropathy. Correlate
clinically for infectious complication.
2. Similar size nonobstructive caliceal stone in the right kidney.
No intrarenal stone on the left.
3. Stable left hepatic cysts.
4. Constipation and diverticulosis.
5. Aortic atherosclerosis.

## 2021-02-04 MED ORDER — HYDROCODONE-ACETAMINOPHEN 5-325 MG PO TABS: 1.0000 | ORAL_TABLET | Freq: Four times a day (QID) | ORAL | 0 refills | Status: DC | PRN

## 2021-02-04 MED ORDER — ONDANSETRON 4 MG PO TBDP: 4.0000 mg | ORAL_TABLET | Freq: Three times a day (TID) | ORAL | 0 refills | Status: DC | PRN

## 2021-02-04 MED ORDER — KETOROLAC TROMETHAMINE 30 MG/ML IJ SOLN
15.0000 mg | Freq: Once | INTRAMUSCULAR | Status: AC
  Administered 2021-02-04: 15 mg via INTRAVENOUS
  Filled 2021-02-04: qty 1

## 2021-02-04 MED ORDER — ONDANSETRON HCL 4 MG/2ML IJ SOLN
4.0000 mg | Freq: Once | INTRAMUSCULAR | Status: AC
  Administered 2021-02-04: 4 mg via INTRAVENOUS
  Filled 2021-02-04: qty 2

## 2021-02-04 MED ORDER — HYDROCODONE-ACETAMINOPHEN 5-325 MG PO TABS
1.0000 | ORAL_TABLET | Freq: Once | ORAL | Status: AC
  Administered 2021-02-04: 1 via ORAL
  Filled 2021-02-04: qty 1

## 2021-02-04 MED ORDER — FENTANYL CITRATE PF 50 MCG/ML IJ SOSY
50.0000 ug | PREFILLED_SYRINGE | Freq: Once | INTRAMUSCULAR | Status: AC
  Administered 2021-02-04: 50 ug via INTRAVENOUS
  Filled 2021-02-04: qty 1

## 2021-02-04 NOTE — ED Provider Notes (Signed)
[facility] EMERGENCY DEPARTMENT Provider Note   CSN: [id number] Arrival date & time: 02/04/21  4627     History  Chief Complaint  Patient presents with   Flank Pain    Kimberly Mccormick is a 71 y.o. female.  The history is provided by the patient and a relative.  Flank Pain This is a new problem. The current episode started 3 to 5 hours ago. The problem occurs constantly. The problem has been rapidly worsening. Associated symptoms include abdominal pain. Pertinent negatives include no chest pain. Nothing aggravates the symptoms. Relieved by: Oral pain med.  Patient reports sudden onset of right flank pain that radiates to her lower abdomen.  She reports it is sharp and has urinary pressure.  She had some relief with home pain medicine, but then began to vomit   Patient has previous history of recent ischemic stroke Home Medications Prior to Admission medications   Medication Sig Start Date End Date Taking? Authorizing Provider  acyclovir (ZOVIRAX) 400 MG tablet Take 400 mg by mouth 2 (two) times daily.     [provider]  aspirin EC 81 MG EC tablet Take 1 tablet (81 mg total) by mouth daily. Swallow whole. 01/05/21   Geradine Girt, DO  atorvastatin (LIPITOR) 80 MG tablet Take 1 tablet (80 mg total) by mouth daily. Patient not taking: Reported on 01/08/2021 01/05/21   Geradine Girt, DO  cetirizine (ZYRTEC) 10 MG tablet Take 10 mg by mouth daily.    [provider]  Cholecalciferol (VITAMIN D) 2000 UNITS tablet Take 2,000 Units by mouth daily.    [provider]  diclofenac Sodium (VOLTAREN) 1 % GEL Apply 2 g topically 4 (four) times daily. 01/05/21   Geradine Girt, DO  ferrous sulfate 325 (65 FE) MG tablet Take 325 mg by mouth daily with breakfast.    [provider]  metoprolol succinate (TOPROL-XL) 25 MG 24 hr tablet Take 25 mg by mouth daily. 05/28/17   [provider]  omeprazole (PRILOSEC) 40 MG capsule Take 1 capsule (40 mg  total) by mouth daily. 11/27/20   Harvel Quale, MD  pramipexole (MIRAPEX) 0.5 MG tablet Take 1 tablet (0.5 mg total) by mouth 3 (three) times daily. 12/07/20   Star Age, MD  PROCTO-MED [facility] 2.5 % rectal cream Place rectally 2 (two) times daily as needed. 07/19/20   [provider]  zolpidem (AMBIEN) 10 MG tablet Take 5 mg by mouth at bedtime as needed for sleep.    [provider]      Allergies    Eggs or egg-derived products, Other, and Penicillins    Review of Systems   Review of Systems  Cardiovascular:  Negative for chest pain.  Gastrointestinal:  Positive for abdominal pain, nausea and vomiting.  Genitourinary:  Positive for flank pain.  All other systems reviewed and are negative.  Physical Exam Updated Vital Signs BP (!) 158/87    Pulse (!) 57    Temp 98 F (36.7 C) (Oral)    Resp 12    Ht 1.575 m (5\' 2" )    Wt 60.8 kg    SpO2 100%    BMI 24.52 kg/m  Physical Exam CONSTITUTIONAL: Elderly, uncomfortable appearing HEAD: Normocephalic/atraumatic EYES: EOMI/PERRL ENMT: Mucous membranes moist NECK: supple no meningeal signs SPINE/BACK:entire spine nontender CV: S1/S2 noted, no murmurs/rubs/gallops noted LUNGS: Lungs are clear to auscultation bilaterally, no apparent distress ABDOMEN: soft, nontender, no rebound or guarding, bowel sounds noted throughout abdomen  GU: Right cva tenderness NEURO: Pt is awake/alert/appropriate, moves all extremitiesx4.  No facial droop.   EXTREMITIES: pulses normal/equal, full ROM SKIN: warm, color normal PSYCH: no abnormalities of mood noted, alert and oriented to situation  ED Results / Procedures / Treatments   Labs (all labs ordered are listed, but only abnormal results are displayed) Labs Reviewed  BASIC METABOLIC PANEL - Abnormal; Notable for the following components:      Result Value   Potassium 3.3 (*)    Glucose, Bld 119 (*)    All other components within normal limits  URINALYSIS, ROUTINE W REFLEX  MICROSCOPIC - Abnormal; Notable for the following components:   Hgb urine dipstick LARGE (*)    All other components within normal limits  URINALYSIS, MICROSCOPIC (REFLEX) - Abnormal; Notable for the following components:   Bacteria, UA FEW (*)    All other components within normal limits  CBC WITH DIFFERENTIAL/PLATELET    EKG None  Radiology CT Renal Stone Study  Result Date: 02/04/2021 CLINICAL DATA:  Right flank pain. EXAM: CT ABDOMEN AND PELVIS WITHOUT CONTRAST TECHNIQUE: Multidetector CT imaging of the abdomen and pelvis was performed following the standard protocol without IV contrast. RADIATION DOSE REDUCTION: This exam was performed according to the departmental dose-optimization program which includes automated exposure control, adjustment of the mA and/or kV according to patient size and/or use of iterative reconstruction technique. COMPARISON:  CTs with contrast 08/17/2020, 02/11/2019 FINDINGS: Lower chest: The heart is slightly enlarged. There is no pericardial fluid. Lung bases are clear of infiltrates with mild posterior atelectasis and a stable chronic 6 mm left lower lobe nodule laterally. Hepatobiliary: Small stable cysts again noted in the left hepatic dome. Otherwise unremarkable unenhanced liver. Gallbladder is absent, with no biliary dilatation. Pancreas: Atrophic and otherwise unremarkable. Spleen: No focal abnormality or splenomegaly. Adrenals/Urinary Tract: There is no adrenal mass. There are small renal cysts. 3 mm nonobstructing caliceal stone inferior pole right kidney. There previously was a 3 mm caliceal stone in the right upper pole which is now lodged in the right UVJ, with mild upstream hydroureteronephrosis. Left ureter is clear. There is no bladder thickening. Stomach/Bowel: No dilatation or wall thickening. An appendix is not seen in this patient. Moderate stool retention ascending and transverse colon. Sigmoid diverticula without diverticulitis. Vascular/Lymphatic:  Aortic atherosclerosis. No enlarged abdominal or pelvic lymph nodes. Reproductive: The uterus is intact. There is a small calcified fibroid. There are bilateral BTL changes with no ovarian enlargement. Other: There is no free air, hemorrhage or fluid. Small umbilical fat hernia. Musculoskeletal: No acute or significant osseous findings. IMPRESSION: 1. 3 mm right UVJ stone with mild obstructive uropathy. Correlate clinically for infectious complication. 2. Similar size nonobstructive caliceal stone in the right kidney. No intrarenal stone on the left. 3. Stable left hepatic cysts. 4. Constipation and diverticulosis. 5. Aortic atherosclerosis. Electronically Signed   By: Telford Nab M.D.   On: 02/04/2021 06:30    Procedures Procedures    Medications Ordered in ED Medications  ondansetron (ZOFRAN) injection 4 mg (4 mg Intravenous Given 02/04/21 0448)  fentaNYL (SUBLIMAZE) injection 50 mcg (50 mcg Intravenous Given 02/04/21 0449)  ondansetron (ZOFRAN) injection 4 mg (4 mg Intravenous Given 02/04/21 0640)  ketorolac (TORADOL) 30 MG/ML injection 15 mg (15 mg Intravenous Given 02/04/21 0640)    ED Course/ Medical Decision Making/ A&P Clinical Course as of 02/04/21 0711  Sun Feb 04, 2021  0701 Patient feeling improved.  She was updated on findings.  She feels good  enough to go home.  We discussed return precaution [DW]    Clinical Course User Index [DW] Ripley Fraise, MD                           Medical Decision Making  This patient presents to the ED for concern of flank pain, this involves an extensive number of treatment options, and is a complaint that carries with it a high risk of complications and morbidity.  The differential diagnosis includes kidney stone, UTI, pyelonephritis, AAA, muscle strain, spinal myelopathy  Comorbidities that complicate the patient evaluation: Patients presentation is complicated by their history of ischemic stroke   Additional history  obtained: Additional history obtained from family spoke to son Records reviewed previous admission documents  Lab Tests: I Ordered, and personally interpreted labs.  The pertinent results include: Hematuria  Imaging Studies ordered: I ordered imaging studies including CT scan renal I independently visualized and interpreted imaging which showed right UVJ stone with mild hydronephrosis I agree with the radiologist interpretation  Medicines ordered and prescription drug management: I ordered medication including IV fentanyl for flank pain Reevaluation of the patient after these medicines showed that the patient    improved    Reevaluation: After the interventions noted above, I reevaluated the patient and found that they have :improved  Complexity of problems addressed: Patients presentation is most consistent with  acute complicated illness/injury requiring diagnostic workup      Disposition: After consideration of the diagnostic results and the patients response to treatment,  I feel that the patent would benefit from discharge .            Final Clinical Impression(s) / ED Diagnoses Final diagnoses:  Ureteral stone  Kidney stone    Rx / DC Orders ED Discharge Orders          Ordered    ondansetron (ZOFRAN-ODT) 4 MG disintegrating tablet  Every 8 hours PRN        02/04/21 0705    HYDROcodone-acetaminophen (NORCO/VICODIN) 5-325 MG tablet  Every 6 hours PRN        02/04/21 0705              Ripley Fraise, MD 02/04/21 [phone]

## 2021-02-04 NOTE — ED Notes (Signed)
Patient transported to CT 

## 2021-02-04 NOTE — ED Triage Notes (Signed)
Pt to ED by POV for c/o right flank pain that radiates to lower right abd. Sx started about 1 am. Pt also reports pressure with urination. Pt took hydrocodone at 3 am on empty stomach, afterwards pt got nauseated and vomited twice.

## 2021-02-06 ENCOUNTER — Encounter (HOSPITAL_COMMUNITY): Payer: Self-pay | Admitting: Physical Therapy

## 2021-02-06 ENCOUNTER — Ambulatory Visit (HOSPITAL_COMMUNITY): Payer: Medicare Other | Admitting: Physical Therapy

## 2021-02-06 ENCOUNTER — Other Ambulatory Visit: Payer: Self-pay

## 2021-02-06 DIAGNOSIS — R29818 Other symptoms and signs involving the nervous system: Secondary | ICD-10-CM | POA: Diagnosis not present

## 2021-02-06 DIAGNOSIS — R2681 Unsteadiness on feet: Secondary | ICD-10-CM

## 2021-02-06 DIAGNOSIS — M6281 Muscle weakness (generalized): Secondary | ICD-10-CM | POA: Diagnosis not present

## 2021-02-06 NOTE — Therapy (Signed)
[facility] [address] [place], [place], [address] Phone: [phone]   Fax:  [phone]  Physical Therapy Treatment  Patient Details  Name: Kimberly Mccormick MRN: [id number] Date of Birth: [birth year] Referring Provider (PT): Eulogio Bear   Encounter Date: 02/06/2021   PT End of Session - 02/06/21 1056     Visit Number 8    Number of Visits 12    Date for PT Re-Evaluation 02/20/21    Authorization Type [employer] medicare    Progress Note Due on Visit 10    PT Start Time 1056    PT Stop Time 1140    PT Time Calculation (min) 44 min             Past Medical History:  Diagnosis Date   Anemia    iron def   Aneurysm ([place])    Breast cancer ([place])    left, Status post chemotherapy and XRT   CHF (congestive heart failure) ([place])    DDD (degenerative disc disease), lumbar    Essential hypertension    Essential tremor    Genital warts    GERD (gastroesophageal reflux disease)    History of anemia    History of renal insufficiency    Hyperlipidemia    Hypertension    Insomnia    Mixed hyperlipidemia    Osteoarthritis    Otitis media    right   Parkinson's disease ([place])    Peripheral neuropathy    d/t chemo   Sinus infection 01/04/2014   Stroke [facility])     Past Surgical History:  Procedure Laterality Date   APPENDECTOMY  1966   BIOPSY  08/29/2020   Procedure: BIOPSY;  Surgeon: Harvel Quale, MD;  [facility];  Service: Gastroenterology;;   BREAST LUMPECTOMY WITH RADIOACTIVE SEED AND SENTINEL LYMPH NODE BIOPSY Left 05/06/2018   Procedure: LEFT BREAST LUMPECTOMY WITH RADIOACTIVE SEED AND LEFT DEEP AXILLARY SENTINEL LYMPH NODE BIOPSY AND BLUE DYE INJECTION;  Surgeon: Fanny Skates, MD;  Location: [place];  Service: General;  Laterality: Left;   CATARACT EXTRACTION W/PHACO Left 01/10/2014   Procedure: CATARACT EXTRACTION PHACO AND INTRAOCULAR LENS PLACEMENT ; CDE:  4.94;  Surgeon: Williams Che, MD;  Location: [facility];  Service: Ophthalmology;  Laterality: Left;   CESAREAN SECTION  1986   CHOLECYSTECTOMY     COLONOSCOPY WITH PROPOFOL N/A 07/25/2020   Procedure: COLONOSCOPY WITH PROPOFOL;  Surgeon: Harvel Quale, MD;  [facility];  Service: Gastroenterology;  Laterality: N/A;  10:55   ESOPHAGOGASTRODUODENOSCOPY (EGD) WITH PROPOFOL N/A 08/29/2020   Procedure: ESOPHAGOGASTRODUODENOSCOPY (EGD) WITH PROPOFOL;  Surgeon: Harvel Quale, MD;  [facility];  Service: Gastroenterology;  Laterality: N/A;  12:00   MYRINGOTOMY WITH TUBE PLACEMENT Right 02/04/2017   Procedure: REVISION OF RIGHT MYRINGOTOMY WITH TUBE PLACEMENT, WITH EXAM OF LEFT EAR;  Surgeon: Leta Baptist, MD;  Location: [place];  Service: ENT;  Laterality: Right;   NASAL SINUS SURGERY  2016   polypectomy   POLYPECTOMY  07/25/2020   Procedure: POLYPECTOMY;  Surgeon: Harvel Quale, MD;  [facility];  Service: Gastroenterology;;   POLYPECTOMY  08/29/2020   Procedure: POLYPECTOMY;  Surgeon: Harvel Quale, MD;  [facility];  Service: Gastroenterology;;   [facility] REMOVAL N/A 01/06/2019   Procedure: REMOVAL PORT-A-CATH;  Surgeon: Fanny Skates, MD;  Location: [place];  Service: General;  Laterality: N/A;  PORTACATH PLACEMENT Right 06/16/2018   Procedure: INSERTION PORT-A-CATH;  Surgeon: Fanny Skates, MD;  Location: [place];  Service: General;  Laterality: Right;   SUBMUCOSAL LIFTING INJECTION  08/29/2020   Procedure: SUBMUCOSAL LIFTING INJECTION;  Surgeon: Harvel Quale, MD;  [facility];  Service: Gastroenterology;;   TONSILLECTOMY  1970    There were no vitals filed for this visit.   Subjective Assessment - 02/06/21 1122     Subjective PT states that she is having a little pain in her back she went to the ER and she has a kidney stone.  They gave her some pain  meds while she is passing it    Pertinent History HTN, Lt breast cancer 03/24/2018 , Parkinson    Limitations Lifting;Standing;Walking;House hold activities    How long can you sit comfortably? no problem    How long can you stand comfortably? able to do normal ADL but has not tried to cook or stand for prolong period of time.    How long can you walk comfortably? walks with a quad cane now longest she has walked has been ten minutes.    [facility]    Currently in Pain? Yes    Pain Score 3     Pain Location Back    Pain Descriptors / Indicators Aching    Pain Type Acute pain    Pain Onset More than a month ago                               [facility] Adult PT Treatment/Exercise - 02/06/21 0001       Knee/Hip Exercises: Standing   Heel Raises Both;10 reps    Heel Raises Limitations with UE flexion with 2#    Knee Flexion Limitations toe raises x 1o      Knee/Hip Exercises: Prone   Other Prone Exercises quadriped opposite arm/leg raise x 10                 Balance Exercises - 02/06/21 0001       Balance Exercises: Standing   Standing Eyes Closed Narrow base of support (BOS);Foam/compliant surface;3 reps    SLS with Vectors Solid surface;3 reps;10 secs    Wall Bumps Shoulder;10 reps    Retro Gait Foam/compliant surface;2 reps    Sidestepping Foam/compliant support;2 reps   over hurddles   Step Over Hurdles / Cones 6 and 12" x 2 RT   on foam   Marching Solid surface;Static;10 reps   raising opposite arm   Sit to Stand Standard surface                  PT Short Term Goals - 02/06/21 1147       PT SHORT TERM GOAL #1   Title PT to be I in HEP to improve LT LE strength to allow LT knee pain to decrease to no greater than 5/10.    Time 3    Period Weeks    Status Achieved      PT SHORT TERM GOAL #2   Title PT to be walking in her home without the use of an assistive device    Time 3    Period Weeks    Status  Achieved      PT SHORT TERM GOAL #3   Title PT to be able to single leg stance on her  left LE for 20 seconds to reduce risk of falling.    Time 3    Period Weeks    Status On-going               PT Long Term Goals - 02/06/21 1147       PT LONG TERM GOAL #1   Title PT to be I in an advance HEP to improve LT knee pain to no greater than 3/10.    Time 6    Period Weeks    Status Achieved    Target Date 02/20/21      PT LONG TERM GOAL #2   Title PT Lt LE strength to be at least 4/5 to allow pt to feel confident walking without an assistive device outside.    Time 6    Period Weeks    Status On-going      PT LONG TERM GOAL #3   Title Pt to have retturned to the [facility] at least 2x a week    Time 6    Period Weeks    Status On-going      PT LONG TERM GOAL #4   Title PT  to be able to single leg stance on her left leg for at least 40 seconds to reduce her risk of falling.    Time 6    Period Weeks    Status On-going                   Plan - 02/06/21 1139     Clinical Impression Statement PT is passing a kidney stone but did not let this interfere with her therapy.  Balance was the main focus of this treatment secondary to pt feeling that this is her primary deficiet.  Pt needs to be reminded to keep her core tight as she tends to allow her shoulders to fall behind her base of support.    Personal Factors and Comorbidities Comorbidity 3+    Comorbidities breast cancer, parkinson, HTN    Examination-Activity Limitations Carry;Lift;Locomotion Level;Reach Overhead;Squat;Stairs;Stand    Examination-Participation Restrictions Church;Cleaning;Community Activity;Laundry;Meal Prep;Shop;Yard Work    PT Next Visit Plan begin reciprocal steps and continue to focus on balance.    PT Home Exercise Plan sit to stand, bridge, LT SLR and Lt sidelying abduction; 12/23 - prone hip extension, prone ham curl, sidelying clam, stand heel/toe raise; 12/27:  tandem stance and sidestep  front of counter.; 1/3: quadruped UE then LE             Patient will benefit from skilled therapeutic intervention in order to improve the following deficits and impairments:  Abnormal gait, Decreased activity tolerance, Decreased balance, Difficulty walking, Decreased strength, Pain  Visit Diagnosis: Other symptoms and signs involving the nervous system  Muscle weakness (generalized)  Unsteadiness on feet     Problem List Patient Active Problem List   Diagnosis Date Noted   History of herpes simplex infection 01/08/2021   Papanicolaou smear, as part of routine gynecological examination 01/08/2021   CVA (cerebral vascular accident) ([place]) 01/04/2021   Acute ischemic stroke ([place]) 01/03/2021   Hypokalemia 01/03/2021   Cerebral aneurysm 01/03/2021   Musculoskeletal pain 01/03/2021   Early satiety 11/27/2020   Chronic abdominal pain 07/13/2020   Port-A-Cath in place 06/23/2018   Malignant neoplasm of upper-inner quadrant of left breast in female, estrogen receptor positive ([place]) 04/16/2018   Essential hypertension 03/21/2015   High cholesterol 03/21/2015   Genital herpes 03/21/2015   Fecal urgency 03/21/2015  GERD (gastroesophageal reflux disease) 03/21/2015   Rayetta Humphrey, PT CLT [phone]  02/06/2021, 11:49 AM  [facility] [place], [place], [address] Phone: [phone]   Fax:  [phone]  Name: Kimberly Mccormick MRN: [id number] Date of Birth: Apr 12, 1950

## 2021-02-08 ENCOUNTER — Ambulatory Visit (HOSPITAL_COMMUNITY): Payer: Medicare Other

## 2021-02-09 ENCOUNTER — Encounter: Payer: Self-pay | Admitting: Hematology and Oncology

## 2021-02-12 ENCOUNTER — Ambulatory Visit: Payer: Medicare Other | Admitting: Urology

## 2021-02-13 ENCOUNTER — Encounter (HOSPITAL_COMMUNITY): Payer: Self-pay | Admitting: Physical Therapy

## 2021-02-13 ENCOUNTER — Other Ambulatory Visit: Payer: Self-pay

## 2021-02-13 ENCOUNTER — Ambulatory Visit (HOSPITAL_COMMUNITY): Payer: Medicare Other | Admitting: Physical Therapy

## 2021-02-13 DIAGNOSIS — M6281 Muscle weakness (generalized): Secondary | ICD-10-CM | POA: Diagnosis not present

## 2021-02-13 DIAGNOSIS — R29818 Other symptoms and signs involving the nervous system: Secondary | ICD-10-CM | POA: Diagnosis not present

## 2021-02-13 DIAGNOSIS — R2681 Unsteadiness on feet: Secondary | ICD-10-CM

## 2021-02-13 NOTE — Therapy (Addendum)
[facility] [address] [place], [place], [address] Phone: [phone]   Fax:  [phone]  Physical Therapy Treatment  Patient Details  Name: Kimberly Mccormick MRN: [id number] Date of Birth: Sep 29, 1950 Referring Provider (PT): Eulogio Bear   Progress Note Reporting Period 01/26/2021 to 02/13/2021  See note below for Objective Data and Assessment of Progress/Goals.     Encounter Date: 02/13/2021 Time:  11:30 12:10  PT End of Session - 02/13/21 1152     Visit Number 9    Number of Visits 18    Date for PT Re-Evaluation 03/13/21    Authorization Type [employer] medicare    Progress Note Due on Visit 10    PT Start Time 1130    PT Stop Time 1210    PT Time Calculation (min) 40 min    Activity Tolerance Patient tolerated treatment well             Past Medical History:  Diagnosis Date   Anemia    iron def   Aneurysm ([place])    Breast cancer ([place])    left, Status post chemotherapy and XRT   CHF (congestive heart failure) (HCC)    DDD (degenerative disc disease), lumbar    Essential hypertension    Essential tremor    Genital warts    GERD (gastroesophageal reflux disease)    History of anemia    History of renal insufficiency    Hyperlipidemia    Hypertension    Insomnia    Mixed hyperlipidemia    Osteoarthritis    Otitis media    right   Parkinson's disease ([place])    Peripheral neuropathy    d/t chemo   Sinus infection 01/04/2014   Stroke [facility])     Past Surgical History:  Procedure Laterality Date   APPENDECTOMY  1966   BIOPSY  08/29/2020   Procedure: BIOPSY;  Surgeon: Harvel Quale, MD;  [facility];  Service: Gastroenterology;;   BREAST LUMPECTOMY WITH RADIOACTIVE SEED AND SENTINEL LYMPH NODE BIOPSY Left 05/06/2018   Procedure: LEFT BREAST LUMPECTOMY WITH RADIOACTIVE SEED AND LEFT DEEP AXILLARY SENTINEL LYMPH NODE BIOPSY AND BLUE DYE INJECTION;  Surgeon: Fanny Skates, MD;  Location: [place];  Service: General;  Laterality: Left;   CATARACT EXTRACTION W/PHACO Left 01/10/2014   Procedure: CATARACT EXTRACTION PHACO AND INTRAOCULAR LENS PLACEMENT ; CDE:  4.94;  Surgeon: Williams Che, MD;  [facility]: [facility];  Service: Ophthalmology;  Laterality: Left;   CESAREAN SECTION  1986   CHOLECYSTECTOMY     COLONOSCOPY WITH PROPOFOL N/A 07/25/2020   Procedure: COLONOSCOPY WITH PROPOFOL;  Surgeon: Harvel Quale, MD;  [facility];  Service: Gastroenterology;  Laterality: N/A;  10:55   ESOPHAGOGASTRODUODENOSCOPY (EGD) WITH PROPOFOL N/A 08/29/2020   Procedure: ESOPHAGOGASTRODUODENOSCOPY (EGD) WITH PROPOFOL;  Surgeon: Harvel Quale, MD;  [facility];  Service: Gastroenterology;  Laterality: N/A;  12:00   MYRINGOTOMY WITH TUBE PLACEMENT Right 02/04/2017   Procedure: REVISION OF RIGHT MYRINGOTOMY WITH TUBE PLACEMENT, WITH EXAM OF LEFT EAR;  Surgeon: Leta Baptist, MD;  Location: [place];  Service: ENT;  Laterality: Right;   NASAL SINUS SURGERY  2016   polypectomy   POLYPECTOMY  07/25/2020   Procedure: POLYPECTOMY;  Surgeon: Harvel Quale, MD;  [facility];  Service: Gastroenterology;;   POLYPECTOMY  08/29/2020   Procedure: POLYPECTOMY;  Surgeon: Harvel Quale, MD;  [facility];  Service: Gastroenterology;;   [facility] REMOVAL N/A 01/06/2019   Procedure: REMOVAL PORT-A-CATH;  Surgeon: Fanny Skates, MD;  Location: [place];  Service: General;  Laterality: N/A;   PORTACATH PLACEMENT Right 06/16/2018   Procedure: INSERTION PORT-A-CATH;  Surgeon: Fanny Skates, MD;  Location: [place];  Service: General;  Laterality: Right;   SUBMUCOSAL LIFTING INJECTION  08/29/2020   Procedure: SUBMUCOSAL LIFTING INJECTION;  Surgeon: Harvel Quale, MD;  [facility];  Service: Gastroenterology;;   TONSILLECTOMY  1970    There were no vitals  filed for this visit.   Subjective Assessment - 02/13/21 1231     Subjective PT states she feels like her balance is the thing that she needs to work on quite a bit.    Pertinent History HTN, Lt breast cancer 03/24/2018 , Parkinson    Limitations Lifting;Standing;Walking;House hold activities    How long can you sit comfortably? no problem    How long can you stand comfortably? able to do normal ADL but has not tried to cook or stand for prolong period of time.    How long can you walk comfortably? walks with a quad cane now longest she has walked has been ten minutes.    [facility]    Currently in Pain? Yes    Pain Score 2     Pain Location Back    Pain Orientation Lower    Pain Descriptors / Indicators Aching    Pain Onset More than a month ago                [facility] PT Assessment - 02/13/21 0001       Assessment   Medical Diagnosis CVA    Referring Provider (PT) Eulogio Bear    Onset Date/Surgical Date 01/03/21    Prior Therapy IP      Precautions   Precautions None      Restrictions   Weight Bearing Restrictions No      Home Environment   Living Environment Private residence    Type of [place] Access Stairs to enter    Entrance Stairs-Number of Steps 3      Prior Function   Level of Independence Independent      Cognition   Overall Cognitive Status Within Functional Limits for tasks assessed      Observation/Other Assessments   Focus on Therapeutic Outcomes (FOTO)  66 was 56;      Functional Tests   Functional tests Single leg stance;Sit to Stand      Single Leg Stance   Comments Lt:9" was  5"; RT 60"      Sit to Stand   Comments 11 was 9 in 30 seconds      Strength   Right Hip Flexion 5/5    Right Hip Extension 3+/5   was 3+   Right Hip ABduction 5/5    Left Hip Flexion 4-/5   was 3+   Left Hip Extension 3+/5   was 3-   Left Hip ABduction 3+/5   was 3/5   Right Knee Flexion 5/5    Right Knee Extension 5/5     Left Knee Flexion 3/5    Left Knee Extension 5/5   was 4/5   Right Ankle Dorsiflexion 5/5    Left Ankle Dorsiflexion 5/5      Ambulation/Gait   Ambulation Distance (Feet) 472 Feet  was 292   Assistive device None   was using a cane   Gait Comments 2' walk test                           [facility] Adult PT Treatment/Exercise - 02/13/21 0001       Ambulation/Gait   Stairs Yes    Stair Management Technique No rails    Number of Stairs 12    Height of Stairs 7      Knee/Hip Exercises: Machines for Strengthening   Other Machine leg press 4 x 10 slow control with 3Pl      Knee/Hip Exercises: Standing   Other Standing Knee Exercises sidestepping x 2RT large steps, no weights or TBand      Knee/Hip Exercises: Seated   Sit to [employer] 10 reps      Knee/Hip Exercises: Supine   Bridges 10 reps      Knee/Hip Exercises: Prone   Hip Extension Strengthening;Both;10 reps    Other Prone Exercises quadriped opposite arm/leg raise x 10                       PT Short Term Goals - 02/06/21 1147       PT SHORT TERM GOAL #1   Title PT to be I in HEP to improve LT LE strength to allow LT knee pain to decrease to no greater than 5/10.    Time 3    Period Weeks    Status Achieved      PT SHORT TERM GOAL #2   Title PT to be walking in her home without the use of an assistive device    Time 3    Period Weeks    Status Achieved      PT SHORT TERM GOAL #3   Title PT to be able to single leg stance on her left LE for 20 seconds to reduce risk of falling.    Time 3    Period Weeks    Status On-going               PT Long Term Goals - 02/06/21 1147       PT LONG TERM GOAL #1   Title PT to be I in an advance HEP to improve LT knee pain to no greater than 3/10.    Time 6    Period Weeks    Status Achieved    Target Date 02/20/21      PT LONG TERM GOAL #2   Title PT Lt LE strength to be at least 4/5 to allow pt to feel confident walking without an  assistive device outside.    Time 6    Period Weeks    Status On-going      PT LONG TERM GOAL #3   Title Pt to have retturned to the [employer] at least 2x a week    Time 6    Period Weeks    Status On-going      PT LONG TERM GOAL #4   Title PT  to be able to single leg stance on her left leg for at least 40 seconds to reduce her risk of falling.    Time 6    Period Weeks    Status On-going                   Plan - 02/13/21 1234     Clinical  Impression Statement PT reassessed today with noted improvement in all aspects but continues to have decreased strength and decreased balance.  Ms. Schwimmer will continue to benefit from skilled PT to return her to her prior functional level.    Personal Factors and Comorbidities Comorbidity 3+    Comorbidities breast cancer, parkinson, HTN    Examination-Activity Limitations Carry;Lift;Locomotion Level;Reach Overhead;Squat;Stairs;Stand    Examination-Participation Restrictions Church;Cleaning;Community Activity;Laundry;Meal Prep;Shop;Yard Work    PT Next Visit Plan  continue to focus on balance.    PT Home Exercise Plan sit to stand, bridge, LT SLR and Lt sidelying abduction; 12/23 - prone hip extension, prone ham curl, sidelying clam, stand heel/toe raise; 12/27:  tandem stance and sidestep front of counter.; 1/3: quadruped UE then LE             Patient will benefit from skilled therapeutic intervention in order to improve the following deficits and impairments:  Abnormal gait, Decreased activity tolerance, Decreased balance, Difficulty walking, Decreased strength, Pain  Visit Diagnosis: Unsteadiness on feet - Plan: PT plan of care cert/re-cert  Muscle weakness (generalized) - Plan: PT plan of care cert/re-cert     Problem List Patient Active Problem List   Diagnosis Date Noted   History of herpes simplex infection 01/08/2021   Papanicolaou smear, as part of routine gynecological examination 01/08/2021   CVA (cerebral vascular  accident) ([place]) 01/04/2021   Acute ischemic stroke ([place]) 01/03/2021   Hypokalemia 01/03/2021   Cerebral aneurysm 01/03/2021   Musculoskeletal pain 01/03/2021   Early satiety 11/27/2020   Chronic abdominal pain 07/13/2020   Port-A-Cath in place 06/23/2018   Malignant neoplasm of upper-inner quadrant of left breast in female, estrogen receptor positive ([place]) 04/16/2018   Essential hypertension 03/21/2015   High cholesterol 03/21/2015   Genital herpes 03/21/2015   Fecal urgency 03/21/2015   GERD (gastroesophageal reflux disease) 03/21/2015    Rayetta Humphrey, PT CLT [phone]  02/13/2021, 12:35 PM  [place], [place], [address] Phone: [phone]   Fax:  [phone]  Name: CATALEYA CRISTINA MRN: [id number] Date of Birth: 1950/03/28

## 2021-02-15 ENCOUNTER — Encounter (HOSPITAL_COMMUNITY): Payer: Self-pay | Admitting: Physical Therapy

## 2021-02-15 ENCOUNTER — Ambulatory Visit (HOSPITAL_COMMUNITY): Payer: Medicare Other | Admitting: Physical Therapy

## 2021-02-15 ENCOUNTER — Other Ambulatory Visit: Payer: Self-pay

## 2021-02-15 DIAGNOSIS — R29818 Other symptoms and signs involving the nervous system: Secondary | ICD-10-CM | POA: Diagnosis not present

## 2021-02-15 DIAGNOSIS — R2681 Unsteadiness on feet: Secondary | ICD-10-CM

## 2021-02-15 DIAGNOSIS — M6281 Muscle weakness (generalized): Secondary | ICD-10-CM

## 2021-02-15 NOTE — Therapy (Signed)
[facility] [address] [place], [place], [address] Phone: [phone]   Fax:  [phone]  Physical Therapy Treatment  Patient Details  Name: Kimberly Mccormick MRN: [id number] Date of Birth: 1950/04/11 Referring Provider (PT): Eulogio Bear   Encounter Date: 02/15/2021   PT End of Session - 02/15/21 1145     Visit Number 10    Number of Visits 18    Date for PT Re-Evaluation 03/13/21    Authorization Type [employer] medicare    Progress Note Due on Visit 20   progress note performed during visit #9   PT Start Time 1118    PT Stop Time 1158    PT Time Calculation (min) 40 min    Activity Tolerance Patient tolerated treatment well             Past Medical History:  Diagnosis Date   Anemia    iron def   Aneurysm ([place])    Breast cancer ([place])    left, Status post chemotherapy and XRT   CHF (congestive heart failure) (HCC)    DDD (degenerative disc disease), lumbar    Essential hypertension    Essential tremor    Genital warts    GERD (gastroesophageal reflux disease)    History of anemia    History of renal insufficiency    Hyperlipidemia    Hypertension    Insomnia    Mixed hyperlipidemia    Osteoarthritis    Otitis media    right   Parkinson's disease ([place])    Peripheral neuropathy    d/t chemo   Sinus infection 01/04/2014   Stroke [facility])     Past Surgical History:  Procedure Laterality Date   APPENDECTOMY  1966   BIOPSY  08/29/2020   Procedure: BIOPSY;  Surgeon: Harvel Quale, MD;  [facility];  Service: Gastroenterology;;   BREAST LUMPECTOMY WITH RADIOACTIVE SEED AND SENTINEL LYMPH NODE BIOPSY Left 05/06/2018   Procedure: LEFT BREAST LUMPECTOMY WITH RADIOACTIVE SEED AND LEFT DEEP AXILLARY SENTINEL LYMPH NODE BIOPSY AND BLUE DYE INJECTION;  Surgeon: Fanny Skates, MD;  Location: [place];  Service: General;  Laterality: Left;   CATARACT EXTRACTION W/PHACO Left 01/10/2014    Procedure: CATARACT EXTRACTION PHACO AND INTRAOCULAR LENS PLACEMENT ; CDE:  4.94;  Surgeon: Williams Che, MD;  [facility];  Service: Ophthalmology;  Laterality: Left;   CESAREAN SECTION  1986   CHOLECYSTECTOMY     COLONOSCOPY WITH PROPOFOL N/A 07/25/2020   Procedure: COLONOSCOPY WITH PROPOFOL;  Surgeon: Harvel Quale, MD;  [facility];  Service: Gastroenterology;  Laterality: N/A;  10:55   ESOPHAGOGASTRODUODENOSCOPY (EGD) WITH PROPOFOL N/A 08/29/2020   Procedure: ESOPHAGOGASTRODUODENOSCOPY (EGD) WITH PROPOFOL;  Surgeon: Harvel Quale, MD;  [facility];  Service: Gastroenterology;  Laterality: N/A;  12:00   MYRINGOTOMY WITH TUBE PLACEMENT Right 02/04/2017   Procedure: REVISION OF RIGHT MYRINGOTOMY WITH TUBE PLACEMENT, WITH EXAM OF LEFT EAR;  Surgeon: Leta Baptist, MD;  Location: [place];  Service: ENT;  Laterality: Right;   NASAL SINUS SURGERY  2016   polypectomy   POLYPECTOMY  07/25/2020   Procedure: POLYPECTOMY;  Surgeon: Harvel Quale, MD;  [facility];  Service: Gastroenterology;;   POLYPECTOMY  08/29/2020   Procedure: POLYPECTOMY;  Surgeon: Harvel Quale, MD;  [facility];  Service: Gastroenterology;;   [facility] REMOVAL N/A 01/06/2019   Procedure: REMOVAL PORT-A-CATH;  Surgeon: Dalbert Batman,  Renelda Loma, MD;  Location: [place];  Service: General;  Laterality: N/A;   PORTACATH PLACEMENT Right 06/16/2018   Procedure: INSERTION PORT-A-CATH;  Surgeon: Fanny Skates, MD;  Location: [place];  Service: General;  Laterality: Right;   SUBMUCOSAL LIFTING INJECTION  08/29/2020   Procedure: SUBMUCOSAL LIFTING INJECTION;  Surgeon: Harvel Quale, MD;  [facility];  Service: Gastroenterology;;   TONSILLECTOMY  1970    There were no vitals filed for this visit.   Subjective Assessment - 02/15/21 1135     Subjective Patient states that  balance and occasionally strength are the only two things that are still limiting her.    Pertinent History HTN, Lt breast cancer 03/24/2018 , Parkinson    Limitations Lifting;Standing;Walking;House hold activities    How long can you sit comfortably? no problem    How long can you stand comfortably? able to do normal ADL but has not tried to cook or stand for prolong period of time.    How long can you walk comfortably? walks with a quad cane now longest she has walked has been ten minutes.    [facility]    Currently in Pain? No/denies    Pain Onset More than a month ago                               [facility] Adult PT Treatment/Exercise - 02/15/21 0001       Neuro Re-ed    Neuro Re-ed Details  Cable walking #3 1x5RT each direction and cable walking arcs #3 2x5      Knee/Hip Exercises: Aerobic   Nustep x4.61min                       PT Short Term Goals - 02/06/21 1147       PT SHORT TERM GOAL #1   Title PT to be I in HEP to improve LT Kimberly strength to allow LT knee pain to decrease to no greater than 5/10.    Time 3    Period Weeks    Status Achieved      PT SHORT TERM GOAL #2   Title PT to be walking in her home without the use of an assistive device    Time 3    Period Weeks    Status Achieved      PT SHORT TERM GOAL #3   Title PT to be able to single leg stance on her left Kimberly for 20 seconds to reduce risk of falling.    Time 3    Period Weeks    Status On-going               PT Long Term Goals - 02/06/21 1147       PT LONG TERM GOAL #1   Title PT to be I in an advance HEP to improve LT knee pain to no greater than 3/10.    Time 6    Period Weeks    Status Achieved    Target Date 02/20/21      PT LONG TERM GOAL #2   Title PT Lt Kimberly strength to be at least 4/5 to allow pt to feel confident walking without an assistive device outside.    Time 6    Period Weeks    Status On-going      PT LONG  TERM  GOAL #3   Title Pt to have retturned to the [employer] at least 2x a week    Time 6    Period Weeks    Status On-going      PT LONG TERM GOAL #4   Title PT  to be able to single leg stance on her left leg for at least 40 seconds to reduce her risk of falling.    Time 6    Period Weeks    Status On-going                   Plan - 02/15/21 1216     Clinical Impression Statement Patient tolerated treatment well during today's session and struggled the most with the ecentric phase of forward cable walking. She was able to keep her balance surprisingly well despite the amount of a respective lean she required in order to move against the cable. Gait speed is still slow and cautious.    Personal Factors and Comorbidities Comorbidity 3+    Comorbidities breast cancer, parkinson, HTN    Examination-Activity Limitations Carry;Lift;Locomotion Level;Reach Overhead;Squat;Stairs;Stand    Examination-Participation Restrictions Church;Cleaning;Community Activity;Laundry;Meal Prep;Shop;Yard Work    PT Next Visit Plan begin reciprocal steps and continue to focus on balance.    PT Home Exercise Plan sit to stand, bridge, LT SLR and Lt sidelying abduction; 12/23 - prone hip extension, prone ham curl, sidelying clam, stand heel/toe raise; 12/27:  tandem stance and sidestep front of counter.; 1/3: quadruped UE then Kimberly             Patient will benefit from skilled therapeutic intervention in order to improve the following deficits and impairments:  Abnormal gait, Decreased activity tolerance, Decreased balance, Difficulty walking, Decreased strength, Pain  Visit Diagnosis: Unsteadiness on feet  Muscle weakness (generalized)  Other symptoms and signs involving the nervous system     Problem List Patient Active Problem List   Diagnosis Date Noted   History of herpes simplex infection 01/08/2021   Papanicolaou smear, as part of routine gynecological examination 01/08/2021   CVA (cerebral  vascular accident) ([place]) 01/04/2021   Acute ischemic stroke ([place]) 01/03/2021   Hypokalemia 01/03/2021   Cerebral aneurysm 01/03/2021   Musculoskeletal pain 01/03/2021   Early satiety 11/27/2020   Chronic abdominal pain 07/13/2020   Port-A-Cath in place 06/23/2018   Malignant neoplasm of upper-inner quadrant of left breast in female, estrogen receptor positive ([place]) 04/16/2018   Essential hypertension 03/21/2015   High cholesterol 03/21/2015   Genital herpes 03/21/2015   Fecal urgency 03/21/2015   GERD (gastroesophageal reflux disease) 03/21/2015    12:19 PM,02/15/21 Adalberto Cole, DPT [facility]   [place], [place], [address] Phone: [phone]   Fax:  [phone]  Name: Kimberly Mccormick MRN: [id number] Date of Birth: 1950/08/19

## 2021-02-16 ENCOUNTER — Other Ambulatory Visit: Payer: Self-pay

## 2021-02-16 ENCOUNTER — Ambulatory Visit (INDEPENDENT_AMBULATORY_CARE_PROVIDER_SITE_OTHER): Payer: Medicare Other

## 2021-02-16 DIAGNOSIS — I639 Cerebral infarction, unspecified: Secondary | ICD-10-CM

## 2021-02-20 DIAGNOSIS — E782 Mixed hyperlipidemia: Secondary | ICD-10-CM | POA: Diagnosis not present

## 2021-02-20 DIAGNOSIS — I10 Essential (primary) hypertension: Secondary | ICD-10-CM | POA: Diagnosis not present

## 2021-02-20 DIAGNOSIS — R5383 Other fatigue: Secondary | ICD-10-CM | POA: Diagnosis not present

## 2021-02-20 DIAGNOSIS — E7849 Other hyperlipidemia: Secondary | ICD-10-CM | POA: Diagnosis not present

## 2021-02-20 DIAGNOSIS — I5032 Chronic diastolic (congestive) heart failure: Secondary | ICD-10-CM | POA: Diagnosis not present

## 2021-02-20 DIAGNOSIS — E559 Vitamin D deficiency, unspecified: Secondary | ICD-10-CM | POA: Diagnosis not present

## 2021-02-20 DIAGNOSIS — K219 Gastro-esophageal reflux disease without esophagitis: Secondary | ICD-10-CM | POA: Diagnosis not present

## 2021-02-21 ENCOUNTER — Other Ambulatory Visit: Payer: Self-pay

## 2021-02-21 ENCOUNTER — Ambulatory Visit (HOSPITAL_COMMUNITY): Payer: Medicare Other | Attending: Internal Medicine | Admitting: Physical Therapy

## 2021-02-21 DIAGNOSIS — M6281 Muscle weakness (generalized): Secondary | ICD-10-CM | POA: Diagnosis not present

## 2021-02-21 DIAGNOSIS — R29818 Other symptoms and signs involving the nervous system: Secondary | ICD-10-CM | POA: Insufficient documentation

## 2021-02-21 DIAGNOSIS — R2681 Unsteadiness on feet: Secondary | ICD-10-CM | POA: Insufficient documentation

## 2021-02-21 NOTE — Therapy (Signed)
[facility] [address] [place], [place], [address] Phone: [phone]   Fax:  [phone]  Physical Therapy Treatment  Patient Details  Name: Kimberly Mccormick MRN: [id number] Date of Birth: April 22, 1950 Referring Provider (PT): Eulogio Bear   Encounter Date: 02/21/2021   PT End of Session - 02/21/21 1131     Visit Number 11    Number of Visits 18    Date for PT Re-Evaluation 03/13/21    Authorization Type [employer] medicare    Progress Note Due on Visit 20   progress note performed during visit #9   PT Start Time 1050    PT Stop Time 1133    PT Time Calculation (min) 43 min    Activity Tolerance Patient tolerated treatment well             Past Medical History:  Diagnosis Date   Anemia    iron def   Aneurysm ([place])    Breast cancer ([place])    left, Status post chemotherapy and XRT   CHF (congestive heart failure) ([place])    DDD (degenerative disc disease), lumbar    Essential hypertension    Essential tremor    Genital warts    GERD (gastroesophageal reflux disease)    History of anemia    History of renal insufficiency    Hyperlipidemia    Hypertension    Insomnia    Mixed hyperlipidemia    Osteoarthritis    Otitis media    right   Parkinson's disease ([place])    Peripheral neuropathy    d/t chemo   Sinus infection 01/04/2014   Stroke [facility])     Past Surgical History:  Procedure Laterality Date   APPENDECTOMY  1966   BIOPSY  08/29/2020   Procedure: BIOPSY;  Surgeon: Harvel Quale, MD;  [facility];  Service: Gastroenterology;;   BREAST LUMPECTOMY WITH RADIOACTIVE SEED AND SENTINEL LYMPH NODE BIOPSY Left 05/06/2018   Procedure: LEFT BREAST LUMPECTOMY WITH RADIOACTIVE SEED AND LEFT DEEP AXILLARY SENTINEL LYMPH NODE BIOPSY AND BLUE DYE INJECTION;  Surgeon: Fanny Skates, MD;  Location: [facility];  Service: General;  Laterality: Left;   CATARACT EXTRACTION W/PHACO Left 01/10/2014    Procedure: CATARACT EXTRACTION PHACO AND INTRAOCULAR LENS PLACEMENT ; CDE:  4.94;  Surgeon: Williams Che, MD;  [facility];  Service: Ophthalmology;  Laterality: Left;   CESAREAN SECTION  1986   CHOLECYSTECTOMY     COLONOSCOPY WITH PROPOFOL N/A 07/25/2020   Procedure: COLONOSCOPY WITH PROPOFOL;  Surgeon: Harvel Quale, MD;  [facility];  Service: Gastroenterology;  Laterality: N/A;  10:55   ESOPHAGOGASTRODUODENOSCOPY (EGD) WITH PROPOFOL N/A 08/29/2020   Procedure: ESOPHAGOGASTRODUODENOSCOPY (EGD) WITH PROPOFOL;  Surgeon: Harvel Quale, MD;  [facility];  Service: Gastroenterology;  Laterality: N/A;  12:00   MYRINGOTOMY WITH TUBE PLACEMENT Right 02/04/2017   Procedure: REVISION OF RIGHT MYRINGOTOMY WITH TUBE PLACEMENT, WITH EXAM OF LEFT EAR;  Surgeon: Leta Baptist, MD;  Location: [facility];  Service: ENT;  Laterality: Right;   NASAL SINUS SURGERY  2016   polypectomy   POLYPECTOMY  07/25/2020   Procedure: POLYPECTOMY;  Surgeon: Harvel Quale, MD;  [facility];  Service: Gastroenterology;;   POLYPECTOMY  08/29/2020   Procedure: POLYPECTOMY;  Surgeon: Harvel Quale, MD;  [facility];  Service: Gastroenterology;;   [facility] REMOVAL N/A 01/06/2019   Procedure: REMOVAL PORT-A-CATH;  Surgeon: Dalbert Batman,  Renelda Loma, MD;  Location: [facility];  Service: General;  Laterality: N/A;   PORTACATH PLACEMENT Right 06/16/2018   Procedure: INSERTION PORT-A-CATH;  Surgeon: Fanny Skates, MD;  Location: [facility];  Service: General;  Laterality: Right;   SUBMUCOSAL LIFTING INJECTION  08/29/2020   Procedure: SUBMUCOSAL LIFTING INJECTION;  Surgeon: Harvel Quale, MD;  [facility];  Service: Gastroenterology;;   TONSILLECTOMY  1970    There were no vitals filed for this visit.   Subjective Assessment - 02/21/21 1053     Subjective pt states 3/10 back pain  but thats about it.  Reports compliance with HEP and feels her balance is improving.    Currently in Pain? Yes    Pain Score 3     Pain Location Back                               [facility] Adult PT Treatment/Exercise - 02/21/21 0001       Neuro Re-ed    Neuro Re-ed Details  Cable walking 3PL (30#) x5RT each direction      Knee/Hip Exercises: Aerobic   Nustep seat 6 spm >55; UE/LE 5 minutes at EOS      Knee/Hip Exercises: Standing   Knee Flexion Both;15 reps    Knee Flexion Limitations 5# weights slow and controlled    SLS with Vectors 10x 5" 1 UE HHA                       PT Short Term Goals - 02/06/21 1147       PT SHORT TERM GOAL #1   Title PT to be I in HEP to improve LT LE strength to allow LT knee pain to decrease to no greater than 5/10.    Time 3    Period Weeks    Status Achieved      PT SHORT TERM GOAL #2   Title PT to be walking in her home without the use of an assistive device    Time 3    Period Weeks    Status Achieved      PT SHORT TERM GOAL #3   Title PT to be able to single leg stance on her left LE for 20 seconds to reduce risk of falling.    Time 3    Period Weeks    Status On-going               PT Long Term Goals - 02/06/21 1147       PT LONG TERM GOAL #1   Title PT to be I in an advance HEP to improve LT knee pain to no greater than 3/10.    Time 6    Period Weeks    Status Achieved    Target Date 02/20/21      PT LONG TERM GOAL #2   Title PT Lt LE strength to be at least 4/5 to allow pt to feel confident walking without an assistive device outside.    Time 6    Period Weeks    Status On-going      PT LONG TERM GOAL #3   Title Pt to have retturned to the [employer] at least 2x a week    Time 6    Period Weeks    Status On-going      PT LONG TERM GOAL #4   Title  PT  to be able to single leg stance on her left leg for at least 40 seconds to reduce her risk of falling.    Time 6    Period Weeks     Status On-going                   Plan - 02/21/21 1129     Clinical Impression Statement Began session with cable pulley walk outs in all directions.  Initially unstable but able to self-correct and improve control with activity.   Eccentric control returning in Rt sidestepping most challenging.  Also worked on activities to improve glute and hamstring strength.   Noted challenge with added weight completing hamstring curls and cues to maintain full 5 second hold with vectors.  Pt will continue to benefit from skilled physical therapy.    Personal Factors and Comorbidities Comorbidity 3+    Comorbidities breast cancer, parkinson, HTN    Examination-Activity Limitations Carry;Lift;Locomotion Level;Reach Overhead;Squat;Stairs;Stand    Examination-Participation Restrictions Church;Cleaning;Community Activity;Laundry;Meal Prep;Shop;Yard Work    PT Next Visit Plan begin reciprocal steps next session; continue to cue to reduce shuffling/dragging of feet with ambulation.    PT Home Exercise Plan sit to stand, bridge, LT SLR and Lt sidelying abduction; 12/23 - prone hip extension, prone ham curl, sidelying clam, stand heel/toe raise; 12/27:  tandem stance and sidestep front of counter.; 1/3: quadruped UE then LE             Patient will benefit from skilled therapeutic intervention in order to improve the following deficits and impairments:  Abnormal gait, Decreased activity tolerance, Decreased balance, Difficulty walking, Decreased strength, Pain  Visit Diagnosis: Unsteadiness on feet  Muscle weakness (generalized)  Other symptoms and signs involving the nervous system     Problem List Patient Active Problem List   Diagnosis Date Noted   History of herpes simplex infection 01/08/2021   Papanicolaou smear, as part of routine gynecological examination 01/08/2021   CVA (cerebral vascular accident) ([place]) 01/04/2021   Acute ischemic stroke ([place]) 01/03/2021   Hypokalemia  01/03/2021   Cerebral aneurysm 01/03/2021   Musculoskeletal pain 01/03/2021   Early satiety 11/27/2020   Chronic abdominal pain 07/13/2020   Port-A-Cath in place 06/23/2018   Malignant neoplasm of upper-inner quadrant of left breast in female, estrogen receptor positive ([place]) 04/16/2018   Essential hypertension 03/21/2015   High cholesterol 03/21/2015   Genital herpes 03/21/2015   Fecal urgency 03/21/2015   GERD (gastroesophageal reflux disease) 03/21/2015   Teena Irani, PTA/CLT, WTA [phone]  Teena Irani, PTA 02/21/2021, 1:26 PM  [facility] [place], [place], [address] Phone: [phone]   Fax:  [phone]  Name: SHARINA PETRE MRN: [id number] Date of Birth: January 26, 1950

## 2021-02-23 DIAGNOSIS — N200 Calculus of kidney: Secondary | ICD-10-CM | POA: Diagnosis not present

## 2021-02-23 DIAGNOSIS — K219 Gastro-esophageal reflux disease without esophagitis: Secondary | ICD-10-CM | POA: Diagnosis not present

## 2021-02-23 DIAGNOSIS — I639 Cerebral infarction, unspecified: Secondary | ICD-10-CM | POA: Diagnosis not present

## 2021-02-23 DIAGNOSIS — C50912 Malignant neoplasm of unspecified site of left female breast: Secondary | ICD-10-CM | POA: Diagnosis not present

## 2021-02-23 DIAGNOSIS — G20 Parkinson's disease: Secondary | ICD-10-CM | POA: Diagnosis not present

## 2021-02-23 DIAGNOSIS — Z0001 Encounter for general adult medical examination with abnormal findings: Secondary | ICD-10-CM | POA: Diagnosis not present

## 2021-02-23 DIAGNOSIS — I671 Cerebral aneurysm, nonruptured: Secondary | ICD-10-CM | POA: Diagnosis not present

## 2021-02-23 DIAGNOSIS — I10 Essential (primary) hypertension: Secondary | ICD-10-CM | POA: Diagnosis not present

## 2021-02-27 ENCOUNTER — Ambulatory Visit (HOSPITAL_COMMUNITY): Payer: Medicare Other

## 2021-02-27 ENCOUNTER — Other Ambulatory Visit: Payer: Self-pay

## 2021-02-27 ENCOUNTER — Encounter (HOSPITAL_COMMUNITY): Payer: Self-pay

## 2021-02-27 DIAGNOSIS — R29818 Other symptoms and signs involving the nervous system: Secondary | ICD-10-CM

## 2021-02-27 DIAGNOSIS — R2681 Unsteadiness on feet: Secondary | ICD-10-CM

## 2021-02-27 DIAGNOSIS — M6281 Muscle weakness (generalized): Secondary | ICD-10-CM

## 2021-02-27 NOTE — Therapy (Signed)
[facility] [address] [place], [place], [address] Phone: [phone]   Fax:  [phone]  Physical Therapy Treatment  Patient Details  Name: Kimberly Mccormick MRN: [id number] Date of Birth: 19-Mar-1950 Referring Provider (PT): Eulogio Bear   Encounter Date: 02/27/2021   PT End of Session - 02/27/21 1119     Visit Number 12    Number of Visits 18    Date for PT Re-Evaluation 03/13/21    Authorization Type [employer] medicare    Progress Note Due on Visit 20   progress note performed during visit #9   PT Start Time 1115    PT Stop Time 1156    PT Time Calculation (min) 41 min    Activity Tolerance Patient tolerated treatment well             Past Medical History:  Diagnosis Date   Anemia    iron def   Aneurysm ([place])    Breast cancer ([place])    left, Status post chemotherapy and XRT   CHF (congestive heart failure) ([place])    DDD (degenerative disc disease), lumbar    Essential hypertension    Essential tremor    Genital warts    GERD (gastroesophageal reflux disease)    History of anemia    History of renal insufficiency    Hyperlipidemia    Hypertension    Insomnia    Mixed hyperlipidemia    Osteoarthritis    Otitis media    right   Parkinson's disease ([place])    Peripheral neuropathy    d/t chemo   Sinus infection 01/04/2014   Stroke [facility])     Past Surgical History:  Procedure Laterality Date   APPENDECTOMY  1966   BIOPSY  08/29/2020   Procedure: BIOPSY;  Surgeon: Harvel Quale, MD;  [facility];  Service: Gastroenterology;;   BREAST LUMPECTOMY WITH RADIOACTIVE SEED AND SENTINEL LYMPH NODE BIOPSY Left 05/06/2018   Procedure: LEFT BREAST LUMPECTOMY WITH RADIOACTIVE SEED AND LEFT DEEP AXILLARY SENTINEL LYMPH NODE BIOPSY AND BLUE DYE INJECTION;  Surgeon: Fanny Skates, MD;  Location: [facility];  Service: General;  Laterality: Left;   CATARACT EXTRACTION W/PHACO Left 01/10/2014    Procedure: CATARACT EXTRACTION PHACO AND INTRAOCULAR LENS PLACEMENT ; CDE:  4.94;  Surgeon: Williams Che, MD;  [facility];  Service: Ophthalmology;  Laterality: Left;   CESAREAN SECTION  1986   CHOLECYSTECTOMY     COLONOSCOPY WITH PROPOFOL N/A 07/25/2020   Procedure: COLONOSCOPY WITH PROPOFOL;  Surgeon: Harvel Quale, MD;  [facility];  Service: Gastroenterology;  Laterality: N/A;  10:55   ESOPHAGOGASTRODUODENOSCOPY (EGD) WITH PROPOFOL N/A 08/29/2020   Procedure: ESOPHAGOGASTRODUODENOSCOPY (EGD) WITH PROPOFOL;  Surgeon: Harvel Quale, MD;  [facility];  Service: Gastroenterology;  Laterality: N/A;  12:00   MYRINGOTOMY WITH TUBE PLACEMENT Right 02/04/2017   Procedure: REVISION OF RIGHT MYRINGOTOMY WITH TUBE PLACEMENT, WITH EXAM OF LEFT EAR;  Surgeon: Leta Baptist, MD;  Location: [facility];  Service: ENT;  Laterality: Right;   NASAL SINUS SURGERY  2016   polypectomy   POLYPECTOMY  07/25/2020   Procedure: POLYPECTOMY;  Surgeon: Harvel Quale, MD;  [facility];  Service: Gastroenterology;;   POLYPECTOMY  08/29/2020   Procedure: POLYPECTOMY;  Surgeon: Harvel Quale, MD;  [facility];  Service: Gastroenterology;;   [facility] REMOVAL N/A 01/06/2019   Procedure: REMOVAL PORT-A-CATH;  Surgeon: Dalbert Batman,  Renelda Loma, MD;  Location: [facility];  Service: General;  Laterality: N/A;   PORTACATH PLACEMENT Right 06/16/2018   Procedure: INSERTION PORT-A-CATH;  Surgeon: Fanny Skates, MD;  Location: [facility];  Service: General;  Laterality: Right;   SUBMUCOSAL LIFTING INJECTION  08/29/2020   Procedure: SUBMUCOSAL LIFTING INJECTION;  Surgeon: Harvel Quale, MD;  [facility];  Service: Gastroenterology;;   TONSILLECTOMY  1970    There were no vitals filed for this visit.   Subjective Assessment - 02/27/21 1118     Subjective Pt arrives for today's  treatment session denying any pain, but states she feels "jittery."    Currently in Pain? No/denies                               [facility] Adult PT Treatment/Exercise - 02/27/21 0001       Neuro Re-ed    Neuro Re-ed Details  Cable walking 3PL (30#) x5RT each direction      Exercises   Exercises Knee/Hip      Knee/Hip Exercises: Aerobic   Nustep seat 6 spm >55; UE/LE 10 minutes at EOS      Knee/Hip Exercises: Standing   Hip Flexion Both;Knee bent    Hip Flexion Limitations 5#, marching, 2 mins    Hip Abduction Both;15 reps;Knee straight    Abduction Limitations 5#    Hip Extension Both;15 reps;Knee straight    Extension Limitations 5#      Knee/Hip Exercises: Seated   Clamshell with TheraBand Red   15 reps w 3 sec hold   Hamstring Curl Both;15 reps    Hamstring Limitations Red tband    Sit to Sand 15 reps;without UE support                       PT Short Term Goals - 02/06/21 1147       PT SHORT TERM GOAL #1   Title PT to be I in HEP to improve LT LE strength to allow LT knee pain to decrease to no greater than 5/10.    Time 3    Period Weeks    Status Achieved      PT SHORT TERM GOAL #2   Title PT to be walking in her home without the use of an assistive device    Time 3    Period Weeks    Status Achieved      PT SHORT TERM GOAL #3   Title PT to be able to single leg stance on her left LE for 20 seconds to reduce risk of falling.    Time 3    Period Weeks    Status On-going               PT Long Term Goals - 02/06/21 1147       PT LONG TERM GOAL #1   Title PT to be I in an advance HEP to improve LT knee pain to no greater than 3/10.    Time 6    Period Weeks    Status Achieved    Target Date 02/20/21      PT LONG TERM GOAL #2   Title PT Lt LE strength to be at least 4/5 to allow pt to feel confident walking without an assistive device outside.    Time 6    Period Weeks    Status On-going  PT LONG TERM GOAL  #3   Title Pt to have retturned to the [employer] at least 2x a week    Time 6    Period Weeks    Status On-going      PT LONG TERM GOAL #4   Title PT  to be able to single leg stance on her left leg for at least 40 seconds to reduce her risk of falling.    Time 6    Period Weeks    Status On-going                   Plan - 02/27/21 1119     Clinical Impression Statement Pt able to demonstrate increased stability with cable pulley walk out today.   Pt requiring min cues for eccentric control with right side stepping.  Pt able to tolerate introduction of numerous standing resisted exercises to increase strenght and function.  Pt requiring seated rest break in between each exercises.  Pt would continue to benefit from skilled phsyical therapy.    Personal Factors and Comorbidities Comorbidity 3+    Comorbidities breast cancer, parkinson, HTN    Examination-Activity Limitations Carry;Lift;Locomotion Level;Reach Overhead;Squat;Stairs;Stand    Examination-Participation Restrictions Church;Cleaning;Community Activity;Laundry;Meal Prep;Shop;Yard Work    PT Next Visit Plan begin reciprocal steps next session; continue to cue to reduce shuffling/dragging of feet with ambulation.    PT Home Exercise Plan sit to stand, bridge, LT SLR and Lt sidelying abduction; 12/23 - prone hip extension, prone ham curl, sidelying clam, stand heel/toe raise; 12/27:  tandem stance and sidestep front of counter.; 1/3: quadruped UE then LE             Patient will benefit from skilled therapeutic intervention in order to improve the following deficits and impairments:  Abnormal gait, Decreased activity tolerance, Decreased balance, Difficulty walking, Decreased strength, Pain  Visit Diagnosis: Muscle weakness (generalized)  Unsteadiness on feet  Other symptoms and signs involving the nervous system     Problem List Patient Active Problem List   Diagnosis Date Noted   History of herpes simplex  infection 01/08/2021   Papanicolaou smear, as part of routine gynecological examination 01/08/2021   CVA (cerebral vascular accident) ([place]) 01/04/2021   Acute ischemic stroke ([place]) 01/03/2021   Hypokalemia 01/03/2021   Cerebral aneurysm 01/03/2021   Musculoskeletal pain 01/03/2021   Early satiety 11/27/2020   Chronic abdominal pain 07/13/2020   Port-A-Cath in place 06/23/2018   Malignant neoplasm of upper-inner quadrant of left breast in female, estrogen receptor positive ([place]) 04/16/2018   Essential hypertension 03/21/2015   High cholesterol 03/21/2015   Genital herpes 03/21/2015   Fecal urgency 03/21/2015   GERD (gastroesophageal reflux disease) 03/21/2015    Kimberly Mccormick, PTA 02/27/2021, 11:59 AM  [facility] [address] [place], [place], [address] Phone: [phone]   Fax:  [phone]  Name: Kimberly Mccormick MRN: [id number] Date of Birth: [birth year]

## 2021-03-01 ENCOUNTER — Ambulatory Visit (HOSPITAL_COMMUNITY): Payer: Medicare Other

## 2021-03-01 ENCOUNTER — Other Ambulatory Visit: Payer: Self-pay

## 2021-03-01 ENCOUNTER — Encounter (HOSPITAL_COMMUNITY): Payer: Self-pay

## 2021-03-01 DIAGNOSIS — R2681 Unsteadiness on feet: Secondary | ICD-10-CM

## 2021-03-01 DIAGNOSIS — M6281 Muscle weakness (generalized): Secondary | ICD-10-CM

## 2021-03-01 DIAGNOSIS — R29818 Other symptoms and signs involving the nervous system: Secondary | ICD-10-CM | POA: Diagnosis not present

## 2021-03-01 NOTE — Therapy (Signed)
[facility] [address] [place], [place], [address] Phone: [phone]   Fax:  [phone]  Physical Therapy Treatment  Patient Details  Name: Kimberly Mccormick MRN: [id number] Date of Birth: 11-07-1950 Referring Provider (PT): Eulogio Bear   Encounter Date: 03/01/2021   PT End of Session - 03/01/21 1006     Visit Number 13    Number of Visits 18    Date for PT Re-Evaluation 03/13/21    Authorization Type UHC medicare    Progress Note Due on Visit 20   PN complete on visit #9   PT Start Time 1000    PT Stop Time 1043    PT Time Calculation (min) 43 min    Activity Tolerance Patient tolerated treatment well    Behavior During Therapy [facility] for tasks assessed/performed             Past Medical History:  Diagnosis Date   Anemia    iron def   Aneurysm ([place])    Breast cancer ([place])    left, Status post chemotherapy and XRT   CHF (congestive heart failure) ([place])    DDD (degenerative disc disease), lumbar    Essential hypertension    Essential tremor    Genital warts    GERD (gastroesophageal reflux disease)    History of anemia    History of renal insufficiency    Hyperlipidemia    Hypertension    Insomnia    Mixed hyperlipidemia    Osteoarthritis    Otitis media    right   Parkinson's disease ([place])    Peripheral neuropathy    d/t chemo   Sinus infection 01/04/2014   Stroke [facility])     Past Surgical History:  Procedure Laterality Date   APPENDECTOMY  1966   BIOPSY  08/29/2020   Procedure: BIOPSY;  Surgeon: Harvel Quale, MD;  [facility];  Service: Gastroenterology;;   BREAST LUMPECTOMY WITH RADIOACTIVE SEED AND SENTINEL LYMPH NODE BIOPSY Left 05/06/2018   Procedure: LEFT BREAST LUMPECTOMY WITH RADIOACTIVE SEED AND LEFT DEEP AXILLARY SENTINEL LYMPH NODE BIOPSY AND BLUE DYE INJECTION;  Surgeon: Fanny Skates, MD;  Location: [facility];  Service: General;  Laterality: Left;   CATARACT  EXTRACTION W/PHACO Left 01/10/2014   Procedure: CATARACT EXTRACTION PHACO AND INTRAOCULAR LENS PLACEMENT ; CDE:  4.94;  Surgeon: Williams Che, MD;  [facility];  Service: Ophthalmology;  Laterality: Left;   CESAREAN SECTION  1986   CHOLECYSTECTOMY     COLONOSCOPY WITH PROPOFOL N/A 07/25/2020   Procedure: COLONOSCOPY WITH PROPOFOL;  Surgeon: Harvel Quale, MD;  [facility];  Service: Gastroenterology;  Laterality: N/A;  10:55   ESOPHAGOGASTRODUODENOSCOPY (EGD) WITH PROPOFOL N/A 08/29/2020   Procedure: ESOPHAGOGASTRODUODENOSCOPY (EGD) WITH PROPOFOL;  Surgeon: Harvel Quale, MD;  [facility];  Service: Gastroenterology;  Laterality: N/A;  12:00   MYRINGOTOMY WITH TUBE PLACEMENT Right 02/04/2017   Procedure: REVISION OF RIGHT MYRINGOTOMY WITH TUBE PLACEMENT, WITH EXAM OF LEFT EAR;  Surgeon: Leta Baptist, MD;  Location: [facility];  Service: ENT;  Laterality: Right;   NASAL SINUS SURGERY  2016   polypectomy   POLYPECTOMY  07/25/2020   Procedure: POLYPECTOMY;  Surgeon: Harvel Quale, MD;  [facility];  Service: Gastroenterology;;   POLYPECTOMY  08/29/2020   Procedure: POLYPECTOMY;  Surgeon: Harvel Quale, MD;  [facility];  Service: Gastroenterology;;   [facility] REMOVAL N/A  01/06/2019   Procedure: REMOVAL PORT-A-CATH;  Surgeon: Fanny Skates, MD;  Location: [place];  Service: General;  Laterality: N/A;   PORTACATH PLACEMENT Right 06/16/2018   Procedure: INSERTION PORT-A-CATH;  Surgeon: Fanny Skates, MD;  Location: [facility];  Service: General;  Laterality: Right;   SUBMUCOSAL LIFTING INJECTION  08/29/2020   Procedure: SUBMUCOSAL LIFTING INJECTION;  Surgeon: Harvel Quale, MD;  [facility];  Service: Gastroenterology;;   TONSILLECTOMY  1970    There were no vitals filed for this visit.   Subjective Assessment - 03/01/21 1005      Subjective Pt stated she is feeling good today, no reports of pain or recent fall.  Has began walking program in church parking lot.    Pertinent History HTN, Lt breast cancer 03/24/2018 , Parkinson    [facility]    Currently in Pain? No/denies                               [facility] Adult PT Treatment/Exercise - 03/01/21 0001       Ambulation/Gait   Ambulation Distance (Feet) 516 Feet    Assistive device None    Stair Management Technique No rails    Number of Stairs 12   3RT   Height of Stairs 7    Gait Comments 2MWT      Knee/Hip Exercises: Standing   Other Standing Knee Exercises Postural strengthening wiht GTB shoulder extension, rows, as well as alternating marching with GTB shoulder extension 10x each                 Balance Exercises - 03/01/21 0001       Balance Exercises: Standing   Tandem Stance Eyes open;Foam/compliant surface;3 reps;30 secs   3 sets: 1: static on foam, 2: head turns, 3: paloff with GTB 4x10   SLS with Vectors Solid surface;3 reps;10 secs    Wall Bumps Shoulder;Hip;10 reps    Balance Beam foward and sidestep with 12in hurdles 2RT    Marching Solid surface;Static;10 reps   alternating while complete static shoulder extension with GTB                 PT Short Term Goals - 02/06/21 1147       PT SHORT TERM GOAL #1   Title PT to be I in HEP to improve LT LE strength to allow LT knee pain to decrease to no greater than 5/10.    Time 3    Period Weeks    Status Achieved      PT SHORT TERM GOAL #2   Title PT to be walking in her home without the use of an assistive device    Time 3    Period Weeks    Status Achieved      PT SHORT TERM GOAL #3   Title PT to be able to single leg stance on her left LE for 20 seconds to reduce risk of falling.    Time 3    Period Weeks    Status On-going               PT Long Term Goals - 02/06/21 1147       PT LONG TERM GOAL #1   Title  PT to be I in an advance HEP to improve LT knee pain to no greater than 3/10.  Time 6    Period Weeks    Status Achieved    Target Date 02/20/21      PT LONG TERM GOAL #2   Title PT Lt LE strength to be at least 4/5 to allow pt to feel confident walking without an assistive device outside.    Time 6    Period Weeks    Status On-going      PT LONG TERM GOAL #3   Title Pt to have retturned to the [employer] at least 2x a week    Time 6    Period Weeks    Status On-going      PT LONG TERM GOAL #4   Title PT  to be able to single leg stance on her left leg for at least 40 seconds to reduce her risk of falling.    Time 6    Period Weeks    Status On-going                   Plan - 03/01/21 1426     Clinical Impression Statement Pt progressing well with functional strengthening and balance activities.  Noted posterior lean during tandem stance and sidestepping over hurdles.  Resumed wall bumps to address abdominal weakness to assist.  Pt with tendency to slouch, educated importance of posture to assist with balalnce.  Added GTB rows and shoulders wiht handout given to add to HEP.  Pt continues to fatigue quickly requiring seated rest breaks between exercises.  2MWT complete this session with increased cadence and no LOB.  Encouraged pt to begin walking program for activity tolerance.    Personal Factors and Comorbidities Comorbidity 3+    Comorbidities breast cancer, parkinson, HTN    Examination-Activity Limitations Carry;Lift;Locomotion Level;Reach Overhead;Squat;Stairs;Stand    Examination-Participation Restrictions Church;Cleaning;Community Activity;Laundry;Meal Prep;Shop;Yard Work    Stability/Clinical Decision Making Evolving/Moderate complexity    Clinical Decision Making Moderate    PT Frequency 2x / week    PT Duration 6 weeks    PT Treatment/Interventions Therapeutic activities;Therapeutic exercise;Balance training;Patient/family education    PT Next Visit Plan complete  MMT next to specify exercises towards weakness.  Continue balance training and functional strengthening.    PT Home Exercise Plan sit to stand, bridge, LT SLR and Lt sidelying abduction; 12/23 - prone hip extension, prone ham curl, sidelying clam, stand heel/toe raise; 12/27:  tandem stance and sidestep front of counter.; 1/3: quadruped UE then LE; 2/9: GTB row and shoulder extension and begin walking program.    Consulted and Agree with Plan of Care Patient             Patient will benefit from skilled therapeutic intervention in order to improve the following deficits and impairments:  Abnormal gait, Decreased activity tolerance, Decreased balance, Difficulty walking, Decreased strength, Pain  Visit Diagnosis: Muscle weakness (generalized)  Unsteadiness on feet  Other symptoms and signs involving the nervous system     Problem List Patient Active Problem List   Diagnosis Date Noted   History of herpes simplex infection 01/08/2021   Papanicolaou smear, as part of routine gynecological examination 01/08/2021   CVA (cerebral vascular accident) ([place]) 01/04/2021   Acute ischemic stroke ([place]) 01/03/2021   Hypokalemia 01/03/2021   Cerebral aneurysm 01/03/2021   Musculoskeletal pain 01/03/2021   Early satiety 11/27/2020   Chronic abdominal pain 07/13/2020   Port-A-Cath in place 06/23/2018   Malignant neoplasm of upper-inner quadrant of left breast in female, estrogen receptor positive ([place]) 04/16/2018   Essential  hypertension 03/21/2015   High cholesterol 03/21/2015   Genital herpes 03/21/2015   Fecal urgency 03/21/2015   GERD (gastroesophageal reflux disease) 03/21/2015   Ihor [place], LPTA/CLT; CBIS [phone]  Aldona Lento, PTA 03/01/2021, 2:34 PM  [facility] [place], [place], [address] Phone: [phone]   Fax:  [phone]  Name: Kimberly Mccormick MRN: [id number] Date of Birth: [birth year]

## 2021-03-05 ENCOUNTER — Telehealth: Payer: Self-pay | Admitting: Neurology

## 2021-03-05 NOTE — Telephone Encounter (Signed)
I spoke with the patient. She asked for a Monday or Wednesday after 9 AM. Her sons bring her and they are not available before 9, plus patient has appts on Tuesday and Thursday. At this time, there is nothing available sooner than April 13th that meets her wishes. Pt said she is going to call transportation to see if she can arrange to be here on 2/22 at her appt that was already scheduled for 8:15 AM. She will call back to reschedule if she cannot arrange to be here. Of note, the pt states this appt is a f/u to her stroke last December. She states she is doing well but she cannot drive until cleared by neurology, so that is why she is more concerned about being seen as soon as possible. Pt was appreciative of the call.

## 2021-03-05 NOTE — Telephone Encounter (Signed)
Pt would like a call from the nurse to discuss a sooner appt than April. Will not  be able to come on scheduled on 03/14/21 at 8:15 am. Son will not be able to bring me. But need to be seen early as possible

## 2021-03-06 ENCOUNTER — Encounter (HOSPITAL_COMMUNITY): Payer: Self-pay

## 2021-03-06 ENCOUNTER — Ambulatory Visit (HOSPITAL_COMMUNITY): Payer: Medicare Other

## 2021-03-06 ENCOUNTER — Other Ambulatory Visit: Payer: Self-pay

## 2021-03-06 DIAGNOSIS — R2681 Unsteadiness on feet: Secondary | ICD-10-CM | POA: Diagnosis not present

## 2021-03-06 DIAGNOSIS — R29818 Other symptoms and signs involving the nervous system: Secondary | ICD-10-CM

## 2021-03-06 DIAGNOSIS — M6281 Muscle weakness (generalized): Secondary | ICD-10-CM

## 2021-03-06 NOTE — Therapy (Signed)
[facility] [address] [place], [place], [address] Phone: [phone]   Fax:  [phone]  Physical Therapy Treatment  Patient Details  Name: Kimberly Mccormick MRN: [id number] Date of Birth: 29-Oct-1950 Referring Provider (PT): Eulogio Bear   Encounter Date: 03/06/2021   PT End of Session - 03/06/21 1129     Visit Number 14    Number of Visits 18    Date for PT Re-Evaluation 03/13/21    Authorization Type UHC medicare    Progress Note Due on Visit 20   PN complete on visit #9   PT Start Time 1115    PT Stop Time 1200    PT Time Calculation (min) 45 min    Activity Tolerance Patient tolerated treatment well    Behavior During Therapy [facility] for tasks assessed/performed             Past Medical History:  Diagnosis Date   Anemia    iron def   Aneurysm ([place])    Breast cancer ([place])    left, Status post chemotherapy and XRT   CHF (congestive heart failure) ([place])    DDD (degenerative disc disease), lumbar    Essential hypertension    Essential tremor    Genital warts    GERD (gastroesophageal reflux disease)    History of anemia    History of renal insufficiency    Hyperlipidemia    Hypertension    Insomnia    Mixed hyperlipidemia    Osteoarthritis    Otitis media    right   Parkinson's disease ([place])    Peripheral neuropathy    d/t chemo   Sinus infection 01/04/2014   Stroke [facility])     Past Surgical History:  Procedure Laterality Date   APPENDECTOMY  1966   BIOPSY  08/29/2020   Procedure: BIOPSY;  Surgeon: Harvel Quale, MD;  [facility];  Service: Gastroenterology;;   BREAST LUMPECTOMY WITH RADIOACTIVE SEED AND SENTINEL LYMPH NODE BIOPSY Left 05/06/2018   Procedure: LEFT BREAST LUMPECTOMY WITH RADIOACTIVE SEED AND LEFT DEEP AXILLARY SENTINEL LYMPH NODE BIOPSY AND BLUE DYE INJECTION;  Surgeon: Fanny Skates, MD;  Location: [facility];  Service: General;  Laterality: Left;    CATARACT EXTRACTION W/PHACO Left 01/10/2014   Procedure: CATARACT EXTRACTION PHACO AND INTRAOCULAR LENS PLACEMENT ; CDE:  4.94;  Surgeon: Williams Che, MD;  [facility];  Service: Ophthalmology;  Laterality: Left;   CESAREAN SECTION  1986   CHOLECYSTECTOMY     COLONOSCOPY WITH PROPOFOL N/A 07/25/2020   Procedure: COLONOSCOPY WITH PROPOFOL;  Surgeon: Harvel Quale, MD;  [facility];  Service: Gastroenterology;  Laterality: N/A;  10:55   ESOPHAGOGASTRODUODENOSCOPY (EGD) WITH PROPOFOL N/A 08/29/2020   Procedure: ESOPHAGOGASTRODUODENOSCOPY (EGD) WITH PROPOFOL;  Surgeon: Harvel Quale, MD;  [facility];  Service: Gastroenterology;  Laterality: N/A;  12:00   MYRINGOTOMY WITH TUBE PLACEMENT Right 02/04/2017   Procedure: REVISION OF RIGHT MYRINGOTOMY WITH TUBE PLACEMENT, WITH EXAM OF LEFT EAR;  Surgeon: Leta Baptist, MD;  Location: [place];  Service: ENT;  Laterality: Right;   NASAL SINUS SURGERY  2016   polypectomy   POLYPECTOMY  07/25/2020   Procedure: POLYPECTOMY;  Surgeon: Harvel Quale, MD;  [facility];  Service: Gastroenterology;;   POLYPECTOMY  08/29/2020   Procedure: POLYPECTOMY;  Surgeon: Harvel Quale, MD;  [facility];  Service: Gastroenterology;;   [facility] REMOVAL N/A  01/06/2019   Procedure: REMOVAL PORT-A-CATH;  Surgeon: Fanny Skates, MD;  Location: [place];  Service: General;  Laterality: N/A;   PORTACATH PLACEMENT Right 06/16/2018   Procedure: INSERTION PORT-A-CATH;  Surgeon: Fanny Skates, MD;  Location: [place];  Service: General;  Laterality: Right;   SUBMUCOSAL LIFTING INJECTION  08/29/2020   Procedure: SUBMUCOSAL LIFTING INJECTION;  Surgeon: Harvel Quale, MD;  [facility];  Service: Gastroenterology;;   TONSILLECTOMY  1970    There were no vitals filed for this visit.   Subjective Assessment - 03/06/21  1116     Subjective Pt arrives for today's treatment session denying any pain.  States that church walking program is going well.    Pertinent History HTN, Lt breast cancer 03/24/2018 , Parkinson    [facility]    Currently in Pain? No/denies                               [facility] Adult PT Treatment/Exercise - 03/06/21 0001       Neuro Re-ed    Neuro Re-ed Details  Cable walking 3PL (30#) x5RT each direction      Exercises   Exercises Knee/Hip      Knee/Hip Exercises: Aerobic   Nustep seat 6 spm >55; UE/LE 10 minutes at EOS   for endurance and activity tolerance at end or session     Knee/Hip Exercises: Standing   Hip Flexion Both;Knee bent    Hip Flexion Limitations 5#, marching, 2 mins    Hip Abduction Both;15 reps;Knee straight    Abduction Limitations 5#    Hip Extension Both;15 reps;Knee straight    Extension Limitations 5#                 Balance Exercises - 03/06/21 0001       Balance Exercises: Standing   Tandem Stance Eyes open;Foam/compliant surface;3 reps;30 secs   Alternating LE   SLS with Vectors Solid surface;3 reps;10 secs    Wall Bumps Shoulder;Hip;10 reps                  PT Short Term Goals - 02/06/21 1147       PT SHORT TERM GOAL #1   Title PT to be I in HEP to improve LT LE strength to allow LT knee pain to decrease to no greater than 5/10.    Time 3    Period Weeks    Status Achieved      PT SHORT TERM GOAL #2   Title PT to be walking in her home without the use of an assistive device    Time 3    Period Weeks    Status Achieved      PT SHORT TERM GOAL #3   Title PT to be able to single leg stance on her left LE for 20 seconds to reduce risk of falling.    Time 3    Period Weeks    Status On-going               PT Long Term Goals - 02/06/21 1147       PT LONG TERM GOAL #1   Title PT to be I in an advance HEP to improve LT knee pain to no greater than 3/10.    Time 6     Period Weeks    Status Achieved  Target Date 02/20/21      PT LONG TERM GOAL #2   Title PT Lt LE strength to be at least 4/5 to allow pt to feel confident walking without an assistive device outside.    Time 6    Period Weeks    Status On-going      PT LONG TERM GOAL #3   Title Pt to have retturned to the [employer] at least 2x a week    Time 6    Period Weeks    Status On-going      PT LONG TERM GOAL #4   Title PT  to be able to single leg stance on her left leg for at least 40 seconds to reduce her risk of falling.    Time 6    Period Weeks    Status On-going                   Plan - 03/06/21 1129     Clinical Impression Statement Pt arrives for today's treatment session denying any pain.  Pt requiring cues to avoid slouching.  Pt given seated rest breaks as needed for fatiuge throughout treatment session.  Pt encouraged to continue walking program at church and increase distance as tolerated.    Personal Factors and Comorbidities Comorbidity 3+    Comorbidities breast cancer, parkinson, HTN    Examination-Activity Limitations Carry;Lift;Locomotion Level;Reach Overhead;Squat;Stairs;Stand    Examination-Participation Restrictions Church;Cleaning;Community Activity;Laundry;Meal Prep;Shop;Yard Work    Stability/Clinical Decision Making Evolving/Moderate complexity    PT Frequency 2x / week    PT Duration 6 weeks    PT Treatment/Interventions Therapeutic activities;Therapeutic exercise;Balance training;Patient/family education    PT Next Visit Plan complete MMT next to specify exercises towards weakness.  Continue balance training and functional strengthening.    PT Home Exercise Plan sit to stand, bridge, LT SLR and Lt sidelying abduction; 12/23 - prone hip extension, prone ham curl, sidelying clam, stand heel/toe raise; 12/27:  tandem stance and sidestep front of counter.; 1/3: quadruped UE then LE; 2/9: GTB row and shoulder extension and begin walking program.     Consulted and Agree with Plan of Care Patient             Patient will benefit from skilled therapeutic intervention in order to improve the following deficits and impairments:  Abnormal gait, Decreased activity tolerance, Decreased balance, Difficulty walking, Decreased strength, Pain  Visit Diagnosis: Muscle weakness (generalized)  Unsteadiness on feet  Other symptoms and signs involving the nervous system     Problem List Patient Active Problem List   Diagnosis Date Noted   History of herpes simplex infection 01/08/2021   Papanicolaou smear, as part of routine gynecological examination 01/08/2021   CVA (cerebral vascular accident) ([place]) 01/04/2021   Acute ischemic stroke ([place]) 01/03/2021   Hypokalemia 01/03/2021   Cerebral aneurysm 01/03/2021   Musculoskeletal pain 01/03/2021   Early satiety 11/27/2020   Chronic abdominal pain 07/13/2020   Port-A-Cath in place 06/23/2018   Malignant neoplasm of upper-inner quadrant of left breast in female, estrogen receptor positive ([place]) 04/16/2018   Essential hypertension 03/21/2015   High cholesterol 03/21/2015   Genital herpes 03/21/2015   Fecal urgency 03/21/2015   GERD (gastroesophageal reflux disease) 03/21/2015    Kathrynn Ducking, PTA 03/06/2021, 12:01 PM  [facility], [place], [address] Phone: [phone]   Fax:  [phone]  Name: Kimberly Mccormick MRN: [id number] Date of Birth: May 07, 1950

## 2021-03-08 ENCOUNTER — Ambulatory Visit: Payer: Medicare Other | Admitting: Cardiology

## 2021-03-09 ENCOUNTER — Other Ambulatory Visit: Payer: Self-pay

## 2021-03-09 ENCOUNTER — Ambulatory Visit (HOSPITAL_COMMUNITY): Payer: Medicare Other | Admitting: Physical Therapy

## 2021-03-09 DIAGNOSIS — R29818 Other symptoms and signs involving the nervous system: Secondary | ICD-10-CM | POA: Diagnosis not present

## 2021-03-09 DIAGNOSIS — R2681 Unsteadiness on feet: Secondary | ICD-10-CM | POA: Diagnosis not present

## 2021-03-09 DIAGNOSIS — M6281 Muscle weakness (generalized): Secondary | ICD-10-CM

## 2021-03-09 NOTE — Therapy (Signed)
[facility] [address] [place], [place], [address] Phone: [phone]   Fax:  [phone]  Physical Therapy Treatment  Patient Details  Name: Kimberly Mccormick MRN: [id number] Date of Birth: [birth year] Referring Provider (PT): Eulogio Bear   Encounter Date: 03/09/2021   PT End of Session - 03/09/21 1031     Visit Number 15    Number of Visits 18    Date for PT Re-Evaluation 03/13/21    Authorization Type [employer] medicare    Progress Note Due on Visit 20   PN complete on visit #9   PT Start Time 1032    PT Stop Time 1114    PT Time Calculation (min) 42 min    Activity Tolerance Patient tolerated treatment well    Behavior During Therapy WFL for tasks assessed/performed             Past Medical History:  Diagnosis Date   Anemia    iron def   Aneurysm ([place])    Breast cancer ([place])    left, Status post chemotherapy and XRT   CHF (congestive heart failure) ([place])    DDD (degenerative disc disease), lumbar    Essential hypertension    Essential tremor    Genital warts    GERD (gastroesophageal reflux disease)    History of anemia    History of renal insufficiency    Hyperlipidemia    Hypertension    Insomnia    Mixed hyperlipidemia    Osteoarthritis    Otitis media    right   Parkinson's disease ([place])    Peripheral neuropathy    d/t chemo   Sinus infection 01/04/2014   Stroke [facility])     Past Surgical History:  Procedure Laterality Date   APPENDECTOMY  1966   BIOPSY  08/29/2020   Procedure: BIOPSY;  Surgeon: Harvel Quale, MD;  [facility];  Service: Gastroenterology;;   BREAST LUMPECTOMY WITH RADIOACTIVE SEED AND SENTINEL LYMPH NODE BIOPSY Left 05/06/2018   Procedure: LEFT BREAST LUMPECTOMY WITH RADIOACTIVE SEED AND LEFT DEEP AXILLARY SENTINEL LYMPH NODE BIOPSY AND BLUE DYE INJECTION;  Surgeon: Fanny Skates, MD;  Location: [facility];  Service: General;  Laterality: Left;    CATARACT EXTRACTION W/PHACO Left 01/10/2014   Procedure: CATARACT EXTRACTION PHACO AND INTRAOCULAR LENS PLACEMENT ; CDE:  4.94;  Surgeon: Williams Che, MD;  Location: [facility];  Service: Ophthalmology;  Laterality: Left;   CESAREAN SECTION  1986   CHOLECYSTECTOMY     COLONOSCOPY WITH PROPOFOL N/A 07/25/2020   Procedure: COLONOSCOPY WITH PROPOFOL;  Surgeon: Harvel Quale, MD;  [facility];  Service: Gastroenterology;  Laterality: N/A;  10:55   ESOPHAGOGASTRODUODENOSCOPY (EGD) WITH PROPOFOL N/A 08/29/2020   Procedure: ESOPHAGOGASTRODUODENOSCOPY (EGD) WITH PROPOFOL;  Surgeon: Harvel Quale, MD;  [facility];  Service: Gastroenterology;  Laterality: N/A;  12:00   MYRINGOTOMY WITH TUBE PLACEMENT Right 02/04/2017   Procedure: REVISION OF RIGHT MYRINGOTOMY WITH TUBE PLACEMENT, WITH EXAM OF LEFT EAR;  Surgeon: Leta Baptist, MD;  Location: [facility];  Service: ENT;  Laterality: Right;   NASAL SINUS SURGERY  2016   polypectomy   POLYPECTOMY  07/25/2020   Procedure: POLYPECTOMY;  Surgeon: Harvel Quale, MD;  [facility];  Service: Gastroenterology;;   POLYPECTOMY  08/29/2020   Procedure: POLYPECTOMY;  Surgeon: Harvel Quale, MD;  [facility];  Service: Gastroenterology;;   [facility] REMOVAL N/A  01/06/2019   Procedure: REMOVAL PORT-A-CATH;  Surgeon: Fanny Skates, MD;  Location: [place];  Service: General;  Laterality: N/A;   PORTACATH PLACEMENT Right 06/16/2018   Procedure: INSERTION PORT-A-CATH;  Surgeon: Fanny Skates, MD;  Location: [facility];  Service: General;  Laterality: Right;   SUBMUCOSAL LIFTING INJECTION  08/29/2020   Procedure: SUBMUCOSAL LIFTING INJECTION;  Surgeon: Harvel Quale, MD;  [facility];  Service: Gastroenterology;;   TONSILLECTOMY  1970    There were no vitals filed for this visit.   Subjective Assessment - 03/09/21  1034     Subjective Patient reports that she is doing well and is not having any aches today even with the rain.    Pertinent History HTN, Lt breast cancer 03/24/2018 , Parkinson    [facility]    Currently in Pain? No/denies                [facility] PT Assessment - 03/09/21 0001       Ambulation/Gait   Ambulation/Gait Yes    Ambulation Distance (Feet) 419 Feet   2MWT                          [facility] Adult PT Treatment/Exercise - 03/09/21 0001       Neuro Re-ed    Neuro Re-ed Details  Cable walking arcs #2 1x4RT forward and backward and #3 2x4 RT side/side      Knee/Hip Exercises: Aerobic   Nustep x26min(seat lvl9 and arms 10.5)                       PT Short Term Goals - 02/06/21 1147       PT SHORT TERM GOAL #1   Title PT to be I in HEP to improve LT LE strength to allow LT knee pain to decrease to no greater than 5/10.    Time 3    Period Weeks    Status Achieved      PT SHORT TERM GOAL #2   Title PT to be walking in her home without the use of an assistive device    Time 3    Period Weeks    Status Achieved      PT SHORT TERM GOAL #3   Title PT to be able to single leg stance on her left LE for 20 seconds to reduce risk of falling.    Time 3    Period Weeks    Status On-going               PT Long Term Goals - 02/06/21 1147       PT LONG TERM GOAL #1   Title PT to be I in an advance HEP to improve LT knee pain to no greater than 3/10.    Time 6    Period Weeks    Status Achieved    Target Date 02/20/21      PT LONG TERM GOAL #2   Title PT Lt LE strength to be at least 4/5 to allow pt to feel confident walking without an assistive device outside.    Time 6    Period Weeks    Status On-going      PT LONG TERM GOAL #3   Title Pt to have retturned to the [facility] at least 2x a week    Time 6  Period Weeks    Status On-going      PT LONG TERM GOAL #4   Title PT  to be able to single leg  stance on her left leg for at least 40 seconds to reduce her risk of falling.    Time 6    Period Weeks    Status On-going                   Plan - 03/09/21 1035     Clinical Impression Statement Patient tolerated treatment well during today's session and was challenged by the cable walking activity. There was a significant difference in balance and control during the side/side arcs with the Lt resisted pull being the worse of the two directions. During both sets she would lose her balance intermittently, but be able to self correct. Fatigue was noted following completion of the 2MWT. Of note, patient had a 3 minute restroom break during the session.    Personal Factors and Comorbidities Comorbidity 3+    Comorbidities breast cancer, parkinson, HTN    Examination-Activity Limitations Carry;Lift;Locomotion Level;Reach Overhead;Squat;Stairs;Stand    Examination-Participation Restrictions Church;Cleaning;Community Activity;Laundry;Meal Prep;Shop;Yard Work    Stability/Clinical Decision Making Evolving/Moderate complexity    PT Frequency 2x / week    PT Duration 6 weeks    PT Treatment/Interventions Therapeutic activities;Therapeutic exercise;Balance training;Patient/family education    PT Next Visit Plan complete MMT next to specify exercises towards weakness.  Continue balance training and functional strengthening.    PT Home Exercise Plan sit to stand, bridge, LT SLR and Lt sidelying abduction; 12/23 - prone hip extension, prone ham curl, sidelying clam, stand heel/toe raise; 12/27:  tandem stance and sidestep front of counter.; 1/3: quadruped UE then LE; 2/9: GTB row and shoulder extension and begin walking program.    Consulted and Agree with Plan of Care Patient             Patient will benefit from skilled therapeutic intervention in order to improve the following deficits and impairments:  Abnormal gait, Decreased activity tolerance, Decreased balance, Difficulty walking,  Decreased strength, Pain  Visit Diagnosis: Muscle weakness (generalized)  Unsteadiness on feet  Other symptoms and signs involving the nervous system     Problem List Patient Active Problem List   Diagnosis Date Noted   History of herpes simplex infection 01/08/2021   Papanicolaou smear, as part of routine gynecological examination 01/08/2021   CVA (cerebral vascular accident) ([place]) 01/04/2021   Acute ischemic stroke ([place]) 01/03/2021   Hypokalemia 01/03/2021   Cerebral aneurysm 01/03/2021   Musculoskeletal pain 01/03/2021   Early satiety 11/27/2020   Chronic abdominal pain 07/13/2020   Port-A-Cath in place 06/23/2018   Malignant neoplasm of upper-inner quadrant of left breast in female, estrogen receptor positive ([place]) 04/16/2018   Essential hypertension 03/21/2015   High cholesterol 03/21/2015   Genital herpes 03/21/2015   Fecal urgency 03/21/2015   GERD (gastroesophageal reflux disease) 03/21/2015    Adalberto Cole, PT 03/09/2021, 11:21 AM  [place], [place], [address] Phone: [phone]   Fax:  [phone]  Name: Kimberly Mccormick MRN: [id number] Date of Birth: 1950/08/31

## 2021-03-14 ENCOUNTER — Telehealth: Payer: Self-pay | Admitting: Neurology

## 2021-03-14 ENCOUNTER — Encounter: Payer: Self-pay | Admitting: Neurology

## 2021-03-14 ENCOUNTER — Ambulatory Visit (INDEPENDENT_AMBULATORY_CARE_PROVIDER_SITE_OTHER): Payer: Medicare Other | Admitting: Neurology

## 2021-03-14 VITALS — BP 136/91 | HR 56 | Ht 63.0 in | Wt 132.8 lb

## 2021-03-14 DIAGNOSIS — G20 Parkinson's disease: Secondary | ICD-10-CM | POA: Diagnosis not present

## 2021-03-14 DIAGNOSIS — I671 Cerebral aneurysm, nonruptured: Secondary | ICD-10-CM | POA: Diagnosis not present

## 2021-03-14 DIAGNOSIS — Z8673 Personal history of transient ischemic attack (TIA), and cerebral infarction without residual deficits: Secondary | ICD-10-CM | POA: Diagnosis not present

## 2021-03-14 NOTE — Telephone Encounter (Signed)
Thanks I already see this on her medication list.

## 2021-03-14 NOTE — Telephone Encounter (Signed)
Pt was told to call and give the name of her Cholesterol medication, the name of it is ezetimibe (ZETIA) 10 MG tablet

## 2021-03-14 NOTE — Patient Instructions (Addendum)
Please continue your Parkinson's medication.  I do not have a reason to restrict your driving.  Please monitor your driving skills carefully. I would like for you to keep to local roads, familiar routes, no nighttime and no highway driving.   Continue exercising regularly and take your medications as directed. As discussed, secondary prevention is key after a stroke. This means: taking care of blood sugar values or diabetes management (A1c goal of less than 7.0), good blood pressure (hypertension) control and optimizing cholesterol management (with LDL goal of less than 70), exercising daily or regularly within your own mobility limitations of course, and overall cardiovascular risk factor reduction, which includes screening for and treatment of obstructive sleep apnea (OSA) and weight management.   Please call us back with the name of your cholesterol medication.  You were found to have a small aneurysm on the left side.  I would like to refer you to a interventional radiologist - Dr. Estanislado Pandy - for additional evaluation and recommendation as well as potential treatment options.  Please follow up in 6 months to see one of our nurse practitioners.

## 2021-03-14 NOTE — Telephone Encounter (Signed)
Sent Via epic, also sent Anderson Malta a message to contact patient to schedule.

## 2021-03-14 NOTE — Progress Notes (Signed)
Subjective:    Patient ID: Kimberly Mccormick is a 71 y.o. female.  HPI    Interim history:   Ms. Kimberly Mccormick is a 71 year old right-handed woman with an underlying medical history of hypertension, hyperlipidemia, reflux disease, anemia, degenerative lumbar disc disease, breast cancer with status post lumpectomy in 2020, chemotherapy, XRT, history of neuropathy, and insomnia, who presents for follow up consultation of her parkinsonism As well as a new problem visit after recent hospitalization for stroke.  The patient is unaccompanied today. I last saw her on 12/07/2020, at which time she reported doing fairly well on pramipexole, she had noted benefit for her tremor.  She called in the interim on 01/02/2021 reporting new onset leg weakness.  She was advised to proceed to the emergency room.  She presented to the emergency room on 01/03/2021 and was admitted to the hospital, work-up revealed a small acute infarct of the right corona radiata.  She was started on aspirin and statin.  She was given an event monitor.   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  Her brain MRI without contrast on 01/03/2021 showed: IMPRESSION: Small acute infarct of the right corona radiata. Mild chronic microvascular ischemic changes. Chronic small vessel infarcts.  Echocardiogram on 01/04/2021 showed: EF of 55-60, no regional wall motion abnormalities, normal ventricular function bilaterally.    Carotid Doppler ultrasound from 01/04/2021 showed: IMPRESSION: No significant stenosis of internal carotid arteries.   Cardiac event monitor for 30 days showed sinus rhythm with rare PVCs with ventricular bigeminy.  A few brief bursts of NSVT were noted.  No sustained ventricular events and no atrial arrhythmias, particularly no atrial fibrillation was seen.  Today, 03/14/2021: She reports doing well, no residual symptoms, walks independently, could not take the atorvastatin  because the pill was too big and her primary care prescribed an alternative cholesterol medication but she does not recall the name.  She is willing to call us back with the name.  She is supposed to see the cardiologist and she is also supposed to see a urologist.  She presented to the emergency room last month with flank pain and was found to have a ureteral stone.  She does not currently have any urinary symptoms.  Her care advised her no longer to drive until cleared by neurology.  She feels that she is able to drive, she has not fallen.  She feels that the Parkinson's medication is still helping.  She has been taking a baby aspirin.  She denies any headaches.  The patient's allergies, current medications, family history, past medical history, past social history, past surgical history and problem list were reviewed and updated as appropriate.    Previously:       I saw her on 03/01/20, at which time we talked about her recent [place].  She was encouraged to start a dopamine agonist and we mutually agreed to start her on pramipexole low-dose with gradual titration.     I first met her on 12/20/2019 at the request of her primary care physician, at which time she reported a several year history of intermittent left hand tremors.  Examination showed evidence of mild left-sided parkinsonism.  We talked about potentially utilizing symptomatic treatments in the near future and she was encouraged to pursue a DaTscan.  She was agreeable.   She had an interim DaTscan on 02/23/2020 and I reviewed the results:  IMPRESSION: Significant decreased striatal Ioflupane activity most prominent  in the posterior striatum. This pattern can be seen in Parkinsonian syndromes. Of note, DaTSCAN is not diagnostic of Parkinsonian syndromes, which remains a clinical diagnosis. DaTscan is an adjuvant test to aid in the clinical diagnosis of Parkinsonian syndromes.   We called her with her test results and arrange for a  follow-up appointment.   12/20/19: (She) reports a 2 to 43-month history of intermittent left hand tremors. Per son, her tremor may have been ongoing for about 5 to 6 months as per his recollection. She notices the tremor while sitting at rest and also when writing with her right hand, her tremor exacerbates on the left side. She does not have much in the way of right hand tremor and is not particularly bothered by the tremor. She denies any balance issues or falls. Later on in our discussion she does admit to other symptoms including some posture changes with leaning over more than usual and she has noticed that her voice becomes weaker or softer as the day progresses. She has a paternal aunt who had Parkinson's disease. Both parents lived to be 73 years old. She lives with her son, she has another son. She is separated. 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 She is a non-smoker and does not utilize alcohol and does not drink caffeine on a daily basis. She tries to hydrate well but could do better by self-report. She tries to exercise, uses a bike for exercise on a fairly regular basis. I reviewed your office note from 10/06/2019.      Her Past Medical History Is Significant For: Past Medical History:  Diagnosis Date   Anemia    iron def   Aneurysm ([place])    Breast cancer ([place])    left, Status post chemotherapy and XRT   CHF (congestive heart failure) (H[facility])    DDD (degenerative disc disease), lumbar    Essential hypertension    Essential tremor    Genital warts    GERD (gastroesophageal reflux disease)    History of anemia    History of renal insufficiency    Hyperlipidemia    Hypertension    Insomnia    Mixed hyperlipidemia    Osteoarthritis    Otitis media    right   Parkinson's disease ([place])    Peripheral neuropathy    d/t chemo   Sinus infection 01/04/2014   Stroke ([place])     Her Past Surgical History Is Significant For: Past  Surgical History:  Procedure Laterality Date   APPENDECTOMY  1966   BIOPSY  08/29/2020   Procedure: BIOPSY;  Surgeon: Harvel Quale, MD;  [facility];  Service: Gastroenterology;;   BREAST LUMPECTOMY WITH RADIOACTIVE SEED AND SENTINEL LYMPH NODE BIOPSY Left 05/06/2018   Procedure: LEFT BREAST LUMPECTOMY WITH RADIOACTIVE SEED AND LEFT DEEP AXILLARY SENTINEL LYMPH NODE BIOPSY AND BLUE DYE INJECTION;  Surgeon: Fanny Skates, MD;  Location: [place];  Service: General;  Laterality: Left;   CATARACT EXTRACTION W/PHACO Left 01/10/2014   Procedure: CATARACT EXTRACTION PHACO AND INTRAOCULAR LENS PLACEMENT ; CDE:  4.94;  Surgeon: Williams Che, MD;  Location: [facility];  Service: Ophthalmology;  Laterality: Left;   CESAREAN SECTION  1986   CHOLECYSTECTOMY     COLONOSCOPY WITH PROPOFOL N/A 07/25/2020   Procedure: COLONOSCOPY WITH PROPOFOL;  Surgeon: Harvel Quale, MD;  [facility];  Service: Gastroenterology;  Laterality: N/A;  10:55   ESOPHAGOGASTRODUODENOSCOPY (  EGD) WITH PROPOFOL N/A 08/29/2020   Procedure: ESOPHAGOGASTRODUODENOSCOPY (EGD) WITH PROPOFOL;  Surgeon: Harvel Quale, MD;  [facility];  Service: Gastroenterology;  Laterality: N/A;  12:00   MYRINGOTOMY WITH TUBE PLACEMENT Right 02/04/2017   Procedure: REVISION OF RIGHT MYRINGOTOMY WITH TUBE PLACEMENT, WITH EXAM OF LEFT EAR;  Surgeon: Leta Baptist, MD;  Location: [place];  Service: ENT;  Laterality: Right;   NASAL SINUS SURGERY  2016   polypectomy   POLYPECTOMY  07/25/2020   Procedure: POLYPECTOMY;  Surgeon: Harvel Quale, MD;  [facility];  Service: Gastroenterology;;   POLYPECTOMY  08/29/2020   Procedure: POLYPECTOMY;  Surgeon: Harvel Quale, MD;  [facility];  Service: Gastroenterology;;   [facility] REMOVAL N/A 01/06/2019   Procedure: REMOVAL PORT-A-CATH;  Surgeon: Fanny Skates, MD;  Location:  [place];  Service: General;  Laterality: N/A;   PORTACATH PLACEMENT Right 06/16/2018   Procedure: INSERTION PORT-A-CATH;  Surgeon: Fanny Skates, MD;  Location: [place];  Service: General;  Laterality: Right;   SUBMUCOSAL LIFTING INJECTION  08/29/2020   Procedure: SUBMUCOSAL LIFTING INJECTION;  Surgeon: Harvel Quale, MD;  [facility];  Service: Gastroenterology;;   TONSILLECTOMY  1970    Her Family History Is Significant For: Family History  Problem Relation Age of Onset   Lupus Mother    Heart attack Father        age 37   Prostate cancer Father    Hypothyroidism Sister    Breast cancer Sister    Hypertension Sister    Breast cancer Sister    Hypertension Brother    Parkinson's disease Paternal Aunt    Stroke Neg Hx     Her Social History Is Significant For: Social History   Socioeconomic History   Marital status: Legally Separated    Spouse name: Not on file   Number of children: 2   Years of education: Not on file   Highest education level: Not on file  Occupational History    Comment: retired  Tobacco Use   Smoking status: Never   Smokeless tobacco: Never  Vaping Use   Vaping Use: Never used  Substance and Sexual Activity   Alcohol use: No   Drug use: No   Sexual activity: Not Currently    Birth control/protection: Post-menopausal  Other Topics Concern   Not on file  Social History Narrative   One son lives with her   Social Determinants of Health   Financial Resource Strain: Low Risk    Difficulty of Paying Living Expenses: Not hard at all  Food Insecurity: No Food Insecurity   Worried About Charity fundraiser in the Last Year: Never true   Arboriculturist in the Last Year: Never true  Transportation Needs: No Transportation Needs   Lack of Transportation (Medical): No   Lack of Transportation (Non-Medical): No  Physical Activity: Inactive   Days of Exercise per Week: 0 days   Minutes of  Exercise per Session: 0 min  Stress: No Stress Concern Present   Feeling of Stress : Not at all  Social Connections: Moderately Isolated   Frequency of Communication with Friends and Family: More than three times a week   Frequency of Social Gatherings with Friends and Family: More than three times a week   Attends Religious Services: More than 4 times per year   Active Member of Clubs or Organizations: No   Attends  Club or Organization Meetings: Never   Marital Status: Separated    Her Allergies Are:  Allergies  Allergen Reactions   Eggs Or Egg-Derived Products Nausea And Vomiting   Other Nausea And Vomiting    Dairy products\    Penicillins Hives    Has patient had a PCN reaction causing immediate rash, facial/tongue/throat swelling, SOB or lightheadedness with hypotension: No Has patient had a PCN reaction causing severe rash involving mucus membranes or skin necrosis: No Has patient had a PCN reaction that required hospitalization: No Has patient had a PCN reaction occurring within the last 10 years: No If all of the above answers are "NO", then may proceed with Cephalosporin use.   :   Her Current Medications Are:  Outpatient Encounter Medications as of 03/14/2021  Medication Sig   acyclovir (ZOVIRAX) 400 MG tablet Take 400 mg by mouth 2 (two) times daily.    ALPRAZolam (XANAX) 0.5 MG tablet Take 0.5 mg by mouth as needed.   aspirin EC 81 MG EC tablet Take 1 tablet (81 mg total) by mouth daily. Swallow whole.   cetirizine (ZYRTEC) 10 MG tablet Take 10 mg by mouth daily.   Cholecalciferol (VITAMIN D) 2000 UNITS tablet Take 2,000 Units by mouth daily.   diclofenac Sodium (VOLTAREN) 1 % GEL Apply 2 g topically 4 (four) times daily.   ezetimibe (ZETIA) 10 MG tablet Take 10 mg by mouth daily.   ferrous sulfate 325 (65 FE) MG tablet Take 325 mg by mouth daily with breakfast.   HYDROcodone-acetaminophen (NORCO/VICODIN) 5-325 MG tablet Take 1 tablet by mouth every 6 (six) hours as  needed for severe pain.   metoprolol succinate (TOPROL-XL) 25 MG 24 hr tablet Take 25 mg by mouth daily.   omeprazole (PRILOSEC) 40 MG capsule Take 1 capsule (40 mg total) by mouth daily.   ondansetron (ZOFRAN-ODT) 4 MG disintegrating tablet Take 1 tablet (4 mg total) by mouth every 8 (eight) hours as needed.   pramipexole (MIRAPEX) 0.5 MG tablet Take 1 tablet (0.5 mg total) by mouth 3 (three) times daily.   PROCTO-MED HC 2.5 % rectal cream Place rectally 2 (two) times daily as needed.   zolpidem (AMBIEN) 10 MG tablet Take 5 mg by mouth at bedtime as needed for sleep.   atorvastatin (LIPITOR) 80 MG tablet Take 1 tablet (80 mg total) by mouth daily.   Facility-Administered Encounter Medications as of 03/14/2021  Medication   sodium chloride flush (NS) 0.9 % injection 10 mL  :  Review of Systems:  Out of a complete 14 point review of systems, all are reviewed and negative with the exception of these symptoms as listed below:  Review of Systems  Neurological:        Pt is here for follow up hospital stay for CVA. Pt wants approval for her to drive again .   Objective:  Neurological Exam  Physical Exam Physical Examination:   Vitals:   03/14/21 0756  BP: (!) 136/91  Pulse: (!) 56    General Examination: The patient is a very pleasant 71 y.o. female in no acute distress. She appears well-developed and well-nourished and well groomed.   HEENT: Normocephalic, atraumatic, pupils are equal, round and reactive to light. Status post bilateral cataract repairs. Extraocular tracking is fairly well preserved, hearing is mildly impaired. She does not have hearing aids. Airway examination reveals mild mouth dryness, dentures in place, tongue protrudes centrally and palate elevates symmetrically. She has no lip, neck or jaw tremor, mild  nuchal rigidity is noted, mild to moderate facial masking is noted, stable. Normal facial sensation to light touch. She has mild hypophonia, no dysarthria, no voice  tremor noted.   Chest: Clear to auscultation without wheezing, rhonchi or crackles noted.   Heart: S1+S2+0, regular and normal without murmurs, rubs or gallops noted.    Abdomen: Soft, non-tender and non-distended.   Extremities: There is no pitting edema in the distal lower extremities bilaterally.   Skin: Warm and dry without trophic changes noted.   Musculoskeletal: exam reveals no obvious joint deformities.    Neurologically:  Mental status: The patient is awake, alert and oriented in all 4 spheres. Her immediate and remote memory, attention, language skills and fund of knowledge are appropriate. There is no evidence of aphasia, agnosia, apraxia or anomia. Speech is clear with normal prosody and enunciation. Thought process is linear. Mood is normal and affect is normal.  Cranial nerves II - XII are as described above under HEENT exam. In addition: shoulder shrug is normal with equal shoulder height noted. Motor exam: Normal bulk, global strength of 5 out of 5, she has a intermittent mild resting tremor in the left upper extremity only, no other resting tremor noted.   (On 12/20/2019: On Archimedes spiral drawing she has known difficulty with either hand, handwriting with her right hand, which is her dominant hand is legible, not tremulous, not particularly micrographic.)   She has a minimal postural tremor in the left upper extremity, no significant action tremor in either hand, no intention tremor. She has no lower extremity tremor. Fine motor skills and coordination: She has mild to moderate difficulty with finger taps on the left side, minimal difficulty or fairly normal in the right upper extremity. Foot taps and foot agility are fairly normal on the right, mildly impaired on the left. Hand movements and rapid alternating patting are slightly difficult with the left only. She has overall mild bradykinesia.   Cerebellar testing: No dysmetria or intention tremor on finger to nose  testing. Heel to shin is unremarkable bilaterally. There is no truncal or gait ataxia.  Sensory exam: intact to light touch in the upper and lower extremities.  Gait, station and balance: She stands without difficulty, posture is slightly stooped for age, stable. She walks with decreased arm swing on the left, slightly slow in her walking speed with no shuffling noted, no festination, no freezing, stable.  She has mild insecurity with turns.   Assessment and Plan:    In summary, KEILAH LEMIRE is a very pleasant 71 year old female with an underlying medical history of hypertension, hyperlipidemia, reflux disease, anemia, degenerative lumbar disc disease, breast cancer with status post lumpectomy in 2020, chemotherapy, XRT, history of neuropathy, and insomnia, and recent stroke who presents for follow-up consultation of her parkinsonism this is known corona radiata stroke in December 2022.  She presented with a left leg weakness.  She has recuperated, she has had appropriate work-up which we reviewed.  She had a Holter monitor for 30 days and has a cardiac follow-up pending, no obvious cardiac arrhythmias were noted, no significant carotid artery disease, no significant wall motion abnormality on echocardiogram.  She has been on baby aspirin and started a new cholesterol medication per PCP.  She is encouraged to call us for the name of her cholesterol medication as she did not take the atorvastatin.  She has been walking independently and has no recent fall history.  She is advised that she can resume driving  cautiously and as long as she monitors her driving skills and avoids any bad weather or nighttime driving, I would recommend she stay with familiar routes and local roads.  She is indicating that she really does not drive far and she feels comfortable driving.  She is advised to keep her follow-up appointment with the cardiologist and urologist.  She is advised to continue with her pramipexole at the  current dose.  Her Parkinson's symptoms and exam are stable.  Of note, her DaTscan results were supportive of an underlying parkinsonian syndrome.   She has been on pramipexole since February 2022 with good tolerance.  We increased to 0.5 mg 3 times daily in November 2022.  Angiogram of the head revealed a small supraclinoid aneurysm of the left ICA.  I would like to get input from interventional radiologist, Dr. Estanislado Pandy.  She is agreeable to a referral.  We talked about the importance of maintaining a healthy lifestyle, and secondary stroke prevention today. She is currently in therapy in [place] and is planning to join the [facility] locally again.  She is advised to follow-up routinely to see one of our nurse practitioners in 6 months, sooner if needed. I answered all her questions today and she was in agreement.  I spent 45 minutes in total face-to-face time and in reviewing records during pre-charting, more than 50% of which was spent in counseling and coordination of care, reviewing test results, reviewing medications and treatment regimen and/or in discussing or reviewing the diagnosis of PD, Stroke, the prognosis and treatment options. Pertinent laboratory and imaging test results that were available during this visit with the patient were reviewed by me and considered in my medical decision making (see chart for details).

## 2021-03-15 ENCOUNTER — Other Ambulatory Visit: Payer: Self-pay

## 2021-03-15 ENCOUNTER — Other Ambulatory Visit (HOSPITAL_COMMUNITY): Payer: Self-pay | Admitting: Interventional Radiology

## 2021-03-15 ENCOUNTER — Ambulatory Visit (HOSPITAL_COMMUNITY): Payer: Medicare Other | Admitting: Physical Therapy

## 2021-03-15 ENCOUNTER — Telehealth (HOSPITAL_COMMUNITY): Payer: Self-pay

## 2021-03-15 ENCOUNTER — Encounter (HOSPITAL_COMMUNITY): Payer: Self-pay | Admitting: Physical Therapy

## 2021-03-15 DIAGNOSIS — I671 Cerebral aneurysm, nonruptured: Secondary | ICD-10-CM

## 2021-03-15 DIAGNOSIS — R29818 Other symptoms and signs involving the nervous system: Secondary | ICD-10-CM

## 2021-03-15 DIAGNOSIS — M6281 Muscle weakness (generalized): Secondary | ICD-10-CM | POA: Diagnosis not present

## 2021-03-15 DIAGNOSIS — R2681 Unsteadiness on feet: Secondary | ICD-10-CM | POA: Diagnosis not present

## 2021-03-15 NOTE — Therapy (Signed)
[place] [address] [place], [place], [address] Phone: [phone]   Fax:  [phone]  Physical Therapy Treatment  Patient Details  Name: Kimberly Mccormick MRN: [id number] Date of Birth: 08/27/1950 Referring Provider (PT): Eulogio Bear  Progress Note Reporting Period 02/13/2021 to 03/15/2021  See note below for Objective Data and Assessment of Progress/Goals.     Encounter Date: 03/15/2021   PT End of Session - 03/15/21 1025     Visit Number 16    Number of Visits 24    Date for PT Re-Evaluation 04/14/21    Authorization Type [employer]HC medicare    Progress Note Due on Visit 24   PN complete on visit #9   PT Start Time 1005    PT Stop Time 1045    PT Time Calculation (min) 40 min    Activity Tolerance Patient tolerated treatment well    Behavior During Therapy WFL for tasks assessed/performed             Past Medical History:  Diagnosis Date   Anemia    iron def   Aneurysm ([place])    Breast cancer ([place])    left, Status post chemotherapy and XRT   CHF (congestive heart failure) (HCC)    DDD (degenerative disc disease), lumbar    Essential hypertension    Essential tremor    Genital warts    GERD (gastroesophageal reflux disease)    History of anemia    History of renal insufficiency    Hyperlipidemia    Hypertension    Insomnia    Mixed hyperlipidemia    Osteoarthritis    Otitis media    right   Parkinson's disease ([place])    Peripheral neuropathy    d/t chemo   Sinus infection 01/04/2014   Stroke [facility])     Past Surgical History:  Procedure Laterality Date   APPENDECTOMY  1966   BIOPSY  08/29/2020   Procedure: BIOPSY;  Surgeon: Harvel Quale, MD;  [facility];  Service: Gastroenterology;;   BREAST LUMPECTOMY WITH RADIOACTIVE SEED AND SENTINEL LYMPH NODE BIOPSY Left 05/06/2018   Procedure: LEFT BREAST LUMPECTOMY WITH RADIOACTIVE SEED AND LEFT DEEP AXILLARY SENTINEL LYMPH NODE BIOPSY AND BLUE  DYE INJECTION;  Surgeon: Fanny Skates, MD;  Location: [place];  Service: General;  Laterality: Left;   CATARACT EXTRACTION W/PHACO Left 01/10/2014   Procedure: CATARACT EXTRACTION PHACO AND INTRAOCULAR LENS PLACEMENT ; CDE:  4.94;  Surgeon: Williams Che, MD;  [facility];  Service: Ophthalmology;  Laterality: Left;   CESAREAN SECTION  1986   CHOLECYSTECTOMY     COLONOSCOPY WITH PROPOFOL N/A 07/25/2020   Procedure: COLONOSCOPY WITH PROPOFOL;  Surgeon: Harvel Quale, MD;  [facility];  Service: Gastroenterology;  Laterality: N/A;  10:55   ESOPHAGOGASTRODUODENOSCOPY (EGD) WITH PROPOFOL N/A 08/29/2020   Procedure: ESOPHAGOGASTRODUODENOSCOPY (EGD) WITH PROPOFOL;  Surgeon: Harvel Quale, MD;  [facility];  Service: Gastroenterology;  Laterality: N/A;  12:00   MYRINGOTOMY WITH TUBE PLACEMENT Right 02/04/2017   Procedure: REVISION OF RIGHT MYRINGOTOMY WITH TUBE PLACEMENT, WITH EXAM OF LEFT EAR;  Surgeon: Leta Baptist, MD;  Location: [place];  Service: ENT;  Laterality: Right;   NASAL SINUS SURGERY  2016   polypectomy   POLYPECTOMY  07/25/2020   Procedure: POLYPECTOMY;  Surgeon: Harvel Quale, MD;  [facility];  Service: Gastroenterology;;   POLYPECTOMY  08/29/2020  Procedure: POLYPECTOMY;  Surgeon: Harvel Quale, MD;  [facility];  Service: Gastroenterology;;   [facility] REMOVAL N/A 01/06/2019   Procedure: REMOVAL PORT-A-CATH;  Surgeon: Fanny Skates, MD;  Location: [place];  Service: General;  Laterality: N/A;   PORTACATH PLACEMENT Right 06/16/2018   Procedure: INSERTION PORT-A-CATH;  Surgeon: Fanny Skates, MD;  Location: [place];  Service: General;  Laterality: Right;   SUBMUCOSAL LIFTING INJECTION  08/29/2020   Procedure: SUBMUCOSAL LIFTING INJECTION;  Surgeon: Harvel Quale, MD;  [facility];  Service:  Gastroenterology;;   TONSILLECTOMY  1970    There were no vitals filed for this visit.   Subjective Assessment - 03/15/21 1008     Subjective Pt states she has no questions on the exercises.  PT is doing her exerises at home about three times a day. Pt states that she occasionally has difficulty standing up from a low couch.  She states that when she bends down to clean the tub she has difficulty getting up.    Pertinent History HTN, Lt breast cancer 03/24/2018 , Parkinson    Limitations Lifting;Standing;Walking;House hold activities    How long can you sit comfortably? no problem    How long can you stand comfortably? 20 to 30 minutes now    How long can you walk comfortably? walks with a quad cane now longest she has walked has been ten minutes.  Was using a cane and could only walk for 10 minutes    Patient Stated Goals get back to the [facility] PT Assessment - 03/15/21 0001       Functional Tests   Functional tests Single leg stance;Sit to Stand      Single Leg Stance   Comments Lt:40 was  9"; RT 60"      Sit to Stand   Comments 12 in 30 seconds was 11   12 is average for a healthy population.     Strength   Right Hip Flexion 5/5    Right Hip Extension 3/5   was 3+   Right Hip ABduction 5/5    Left Hip Flexion 4/5   last mm test on 1/24 was 4-   Left Hip Extension 3/5   last mm test 1/24 was 3+   Left Hip ABduction 4-/5   last mm test 1/24 was 3+   Right Knee Flexion 5/5    Right Knee Extension 5/5    Left Knee Extension 5/5   was 4/5   Right Ankle Dorsiflexion 5/5    Left Ankle Dorsiflexion 5/5                           [facility] Adult PT Treatment/Exercise - 03/15/21 0001       Exercises   Exercises Knee/Hip      Knee/Hip Exercises: Standing   Heel Raises Both;15 reps    Heel Raises Limitations no hands    SLS Rt:  40";  Lt:40"    Other Standing Knee Exercises standing with green tband hip extension x 15 B forearm supported at  counter B  hip extension    Other Standing Knee Exercises sidestepping x 3 green t band                       PT Short Term Goals -  03/15/21 1013       PT SHORT TERM GOAL #1   Title PT to be I in HEP to improve LT LE strength to allow LT knee pain to decrease to no greater than 5/10.    Time 3    Period Weeks    Status Achieved      PT SHORT TERM GOAL #2   Title PT to be walking in her home without the use of an assistive device    Time 3    Period Weeks    Status Achieved      PT SHORT TERM GOAL #3   Title PT to be able to single leg stance on her left LE for 20 seconds to reduce risk of falling.    Time 3    Period Weeks    Status Achieved               PT Long Term Goals - 03/15/21 1025       PT LONG TERM GOAL #1   Title PT to be I in an advance HEP to improve LT knee pain to no greater than 3/10.    Time 6    Period Weeks    Status Achieved    Target Date 02/20/21      PT LONG TERM GOAL #2   Title PT Lt LE strength to be at least 4/5 to allow pt to feel confident walking without an assistive device outside.    Baseline 2/23:  Pt gluteal mm are at  3 to 3+ making it difficult to come up from cleaning the tub, being on the floor or low lying couches.    Time 6   extend to 12 weeks as pt has not met goal and does not feel she is ready to be discharged.   Period Weeks    Status Partially Met      PT LONG TERM GOAL #3   Title Pt to have retturned to the [facility] at least 2x a week    Time 6    Period Weeks    Status Not Met      PT LONG TERM GOAL #4   Title PT  to be able to single leg stance on her left leg for at least 40 seconds to reduce her risk of falling.    Time 6    Period Weeks    Status Achieved                   Plan - 03/15/21 1049     Clinical Impression Statement Reassessed pt today with continued weakness in gluteal mm limiting functional ability although pt states she feels 80 % better.   PT will continute to benefit  from skilled PT for strengthening and balance activity at a high leve to return her to prior functional leve.    Personal Factors and Comorbidities Comorbidity 3+    Comorbidities breast cancer, parkinson, HTN    Examination-Activity Limitations Carry;Lift;Locomotion Level;Reach Overhead;Squat;Stairs;Stand    Examination-Participation Restrictions Church;Cleaning;Community Activity;Laundry;Meal Prep;Shop;Yard Work    Stability/Clinical Decision Making Evolving/Moderate complexity    PT Frequency 2x / week    PT Duration 6 weeks    PT Treatment/Interventions Therapeutic activities;Therapeutic exercise;Balance training;Patient/family education    PT Next Visit Plan .  Continue balance training and functional strengthening for gluteal mm.    PT Home Exercise Plan sit to stand, bridge, LT SLR and Lt sidelying abduction; 12/23 - prone hip extension, prone ham  curl, sidelying clam, stand heel/toe raise; 12/27:  tandem stance and sidestep front of counter.; 1/3: quadruped UE then LE; 2/9: GTB row and shoulder extension and begin walking program.    Consulted and Agree with Plan of Care Patient             Patient will benefit from skilled therapeutic intervention in order to improve the following deficits and impairments:  Abnormal gait, Decreased activity tolerance, Decreased balance, Difficulty walking, Decreased strength, Pain  Visit Diagnosis: Muscle weakness (generalized) - Plan: PT plan of care cert/re-cert  Other symptoms and signs involving the nervous system - Plan: PT plan of care cert/re-cert     Problem List Patient Active Problem List   Diagnosis Date Noted   History of herpes simplex infection 01/08/2021   Papanicolaou smear, as part of routine gynecological examination 01/08/2021   CVA (cerebral vascular accident) ([place]) 01/04/2021   Acute ischemic stroke ([place]) 01/03/2021   Hypokalemia 01/03/2021   Cerebral aneurysm 01/03/2021   Musculoskeletal pain 01/03/2021   Early  satiety 11/27/2020   Chronic abdominal pain 07/13/2020   Port-A-Cath in place 06/23/2018   Malignant neoplasm of upper-inner quadrant of left breast in female, estrogen receptor positive ([place]) 04/16/2018   Essential hypertension 03/21/2015   High cholesterol 03/21/2015   Genital herpes 03/21/2015   Fecal urgency 03/21/2015   GERD (gastroesophageal reflux disease) 03/21/2015   Rayetta Humphrey, PT CLT [phone]  03/15/2021, 10:53 AM  [place] [address] [place], [place], [address] Phone: [phone]   Fax:  [phone]  Name: CRYSTALE GIANNATTASIO MRN: [id number] Date of Birth: 1950/08/29

## 2021-03-15 NOTE — Telephone Encounter (Signed)
Called to schedule consult, no answer, left vm. AW  

## 2021-03-20 ENCOUNTER — Ambulatory Visit (HOSPITAL_COMMUNITY): Payer: Medicare Other | Admitting: Physical Therapy

## 2021-03-20 ENCOUNTER — Other Ambulatory Visit: Payer: Self-pay

## 2021-03-20 DIAGNOSIS — R29818 Other symptoms and signs involving the nervous system: Secondary | ICD-10-CM

## 2021-03-20 DIAGNOSIS — R2681 Unsteadiness on feet: Secondary | ICD-10-CM

## 2021-03-20 DIAGNOSIS — M6281 Muscle weakness (generalized): Secondary | ICD-10-CM

## 2021-03-20 NOTE — Therapy (Signed)
[place] [address] [place], [place], [address] Phone: [phone]   Fax:  [phone]  Physical Therapy Treatment  Patient Details  Name: Kimberly Mccormick MRN: [id number] Date of Birth: [birth year] Referring Provider (PT): Eulogio Bear   Encounter Date: 03/20/2021   PT End of Session - 03/20/21 1040     Visit Number 17    Number of Visits 24    Date for PT Re-Evaluation 04/14/21    Authorization Type [employer]HC medicare    Progress Note Due on Visit 24   PN complete on visit #9   PT Start Time 1037    PT Stop Time 1112    PT Time Calculation (min) 35 min    Activity Tolerance Patient tolerated treatment well    Behavior During Therapy [facility] for tasks assessed/performed             Past Medical History:  Diagnosis Date   Anemia    iron def   Aneurysm ([place])    Breast cancer ([place])    left, Status post chemotherapy and XRT   CHF (congestive heart failure) ([place])    DDD (degenerative disc disease), lumbar    Essential hypertension    Essential tremor    Genital warts    GERD (gastroesophageal reflux disease)    History of anemia    History of renal insufficiency    Hyperlipidemia    Hypertension    Insomnia    Mixed hyperlipidemia    Osteoarthritis    Otitis media    right   Parkinson's disease ([place])    Peripheral neuropathy    d/t chemo   Sinus infection 01/04/2014   Stroke [facility])     Past Surgical History:  Procedure Laterality Date   APPENDECTOMY  1966   BIOPSY  08/29/2020   Procedure: BIOPSY;  Surgeon: Harvel Quale, MD;  [facility];  Service: Gastroenterology;;   BREAST LUMPECTOMY WITH RADIOACTIVE SEED AND SENTINEL LYMPH NODE BIOPSY Left 05/06/2018   Procedure: LEFT BREAST LUMPECTOMY WITH RADIOACTIVE SEED AND LEFT DEEP AXILLARY SENTINEL LYMPH NODE BIOPSY AND BLUE DYE INJECTION;  Surgeon: Fanny Skates, MD;  Location: [place];  Service: General;  Laterality: Left;    CATARACT EXTRACTION W/PHACO Left 01/10/2014   Procedure: CATARACT EXTRACTION PHACO AND INTRAOCULAR LENS PLACEMENT ; CDE:  4.94;  Surgeon: Williams Che, MD;  [facility];  Service: Ophthalmology;  Laterality: Left;   CESAREAN SECTION  1986   CHOLECYSTECTOMY     COLONOSCOPY WITH PROPOFOL N/A 07/25/2020   Procedure: COLONOSCOPY WITH PROPOFOL;  Surgeon: Harvel Quale, MD;  [facility];  Service: Gastroenterology;  Laterality: N/A;  10:55   ESOPHAGOGASTRODUODENOSCOPY (EGD) WITH PROPOFOL N/A 08/29/2020   Procedure: ESOPHAGOGASTRODUODENOSCOPY (EGD) WITH PROPOFOL;  Surgeon: Harvel Quale, MD;  [facility];  Service: Gastroenterology;  Laterality: N/A;  12:00   MYRINGOTOMY WITH TUBE PLACEMENT Right 02/04/2017   Procedure: REVISION OF RIGHT MYRINGOTOMY WITH TUBE PLACEMENT, WITH EXAM OF LEFT EAR;  Surgeon: Leta Baptist, MD;  Location: [place];  Service: ENT;  Laterality: Right;   NASAL SINUS SURGERY  2016   polypectomy   POLYPECTOMY  07/25/2020   Procedure: POLYPECTOMY;  Surgeon: Harvel Quale, MD;  [facility];  Service: Gastroenterology;;   POLYPECTOMY  08/29/2020   Procedure: POLYPECTOMY;  Surgeon: Harvel Quale, MD;  [facility];  Service: Gastroenterology;;   [facility] REMOVAL N/A  01/06/2019   Procedure: REMOVAL PORT-A-CATH;  Surgeon: Fanny Skates, MD;  Location: [place];  Service: General;  Laterality: N/A;   PORTACATH PLACEMENT Right 06/16/2018   Procedure: INSERTION PORT-A-CATH;  Surgeon: Fanny Skates, MD;  Location: [place];  Service: General;  Laterality: Right;   SUBMUCOSAL LIFTING INJECTION  08/29/2020   Procedure: SUBMUCOSAL LIFTING INJECTION;  Surgeon: Harvel Quale, MD;  [facility];  Service: Gastroenterology;;   TONSILLECTOMY  1970    There were no vitals filed for this visit.   Subjective Assessment - 03/20/21  1042     Subjective Patient reports that there have been no issues at home and she has had no falls.    Pertinent History HTN, Lt breast cancer 03/24/2018 , Parkinson    Limitations Lifting;Standing;Walking;House hold activities    How long can you sit comfortably? no problem    How long can you stand comfortably? 20 to 30 minutes now    How long can you walk comfortably? walks with a quad cane now longest she has walked has been ten minutes.  Was using a cane and could only walk for 10 minutes    [facility]    Currently in Pain? No/denies                               [facility] Adult PT Treatment/Exercise - 03/20/21 0001       Knee/Hip Exercises: Aerobic   Nustep x74min(seat lvl9 and arms 10.5)      Knee/Hip Exercises: Seated   Other Seated Knee/Hip Exercises Leg press([employer]) #4 3x12, Hamstring curl(Cybex) #4 3x10, Leg extension #1 3x10                       PT Short Term Goals - 03/15/21 1013       PT SHORT TERM GOAL #1   Title PT to be I in HEP to improve LT LE strength to allow LT knee pain to decrease to no greater than 5/10.    Time 3    Period Weeks    Status Achieved      PT SHORT TERM GOAL #2   Title PT to be walking in her home without the use of an assistive device    Time 3    Period Weeks    Status Achieved      PT SHORT TERM GOAL #3   Title PT to be able to single leg stance on her left LE for 20 seconds to reduce risk of falling.    Time 3    Period Weeks    Status Achieved               PT Long Term Goals - 03/15/21 1025       PT LONG TERM GOAL #1   Title PT to be I in an advance HEP to improve LT knee pain to no greater than 3/10.    Time 6    Period Weeks    Status Achieved    Target Date 02/20/21      PT LONG TERM GOAL #2   Title PT Lt LE strength to be at least 4/5 to allow pt to feel confident walking without an assistive device outside.    Baseline 2/23:  Pt gluteal mm are  at  3 to 3+ making it difficult to  come up from cleaning the tub, being on the floor or low lying couches.    Time 6   extend to 12 weeks as pt has not met goal and does not feel she is ready to be discharged.   Period Weeks    Status Partially Met      PT LONG TERM GOAL #3   Title Pt to have retturned to the [facility] at least 2x a week    Time 6    Period Weeks    Status Not Met      PT LONG TERM GOAL #4   Title PT  to be able to single leg stance on her left leg for at least 40 seconds to reduce her risk of falling.    Time 6    Period Weeks    Status Achieved                   Plan - 03/20/21 1109     Clinical Impression Statement Patient tolerated today's session well and more of a strengthening approach was taken per patient request. The most fatiguing exercise was the seated hamstring curl although she was allowed to take extended rest breaks between sets. Gait was slower during departure from the session, but no joint pain was reported.    Personal Factors and Comorbidities Comorbidity 3+    Comorbidities breast cancer, parkinson, HTN    Examination-Activity Limitations Carry;Lift;Locomotion Level;Reach Overhead;Squat;Stairs;Stand    Examination-Participation Restrictions Church;Cleaning;Community Activity;Laundry;Meal Prep;Shop;Yard Work    Stability/Clinical Decision Making Evolving/Moderate complexity    PT Frequency 2x / week    PT Duration 6 weeks    PT Treatment/Interventions Therapeutic activities;Therapeutic exercise;Balance training;Patient/family education    PT Next Visit Plan .  Continue balance training and functional strengthening for gluteal mm.    PT Home Exercise Plan sit to stand, bridge, LT SLR and Lt sidelying abduction; 12/23 - prone hip extension, prone ham curl, sidelying clam, stand heel/toe raise; 12/27:  tandem stance and sidestep front of counter.; 1/3: quadruped UE then LE; 2/9: GTB row and shoulder extension and begin walking program.     Consulted and Agree with Plan of Care Patient             Patient will benefit from skilled therapeutic intervention in order to improve the following deficits and impairments:  Abnormal gait, Decreased activity tolerance, Decreased balance, Difficulty walking, Decreased strength, Pain  Visit Diagnosis: Muscle weakness (generalized)  Other symptoms and signs involving the nervous system  Unsteadiness on feet     Problem List Patient Active Problem List   Diagnosis Date Noted   History of herpes simplex infection 01/08/2021   Papanicolaou smear, as part of routine gynecological examination 01/08/2021   CVA (cerebral vascular accident) ([place]) 01/04/2021   Acute ischemic stroke ([place]) 01/03/2021   Hypokalemia 01/03/2021   Cerebral aneurysm 01/03/2021   Musculoskeletal pain 01/03/2021   Early satiety 11/27/2020   Chronic abdominal pain 07/13/2020   Port-A-Cath in place 06/23/2018   Malignant neoplasm of upper-inner quadrant of left breast in female, estrogen receptor positive ([place]) 04/16/2018   Essential hypertension 03/21/2015   High cholesterol 03/21/2015   Genital herpes 03/21/2015   Fecal urgency 03/21/2015   GERD (gastroesophageal reflux disease) 03/21/2015    Adalberto Cole, PT 03/20/2021, 11:13 AM  [place], [place], [address] Phone: [phone]   Fax:  [phone]  Name: Kimberly Mccormick MRN: [id number] Date of Birth:  11/15/1950 ° ° ° °

## 2021-03-21 ENCOUNTER — Encounter (HOSPITAL_COMMUNITY): Payer: Medicare Other | Admitting: Physical Therapy

## 2021-03-21 ENCOUNTER — Ambulatory Visit (HOSPITAL_COMMUNITY)
Admission: RE | Admit: 2021-03-21 | Discharge: 2021-03-21 | Disposition: A | Discharge status: Home / Self Care | Payer: Medicare Other | Source: Ambulatory Visit | Attending: Interventional Radiology | Admitting: Interventional Radiology

## 2021-03-21 DIAGNOSIS — I671 Cerebral aneurysm, nonruptured: Secondary | ICD-10-CM

## 2021-03-21 DIAGNOSIS — I720 Aneurysm of carotid artery: Secondary | ICD-10-CM | POA: Diagnosis not present

## 2021-03-22 HISTORY — PX: IR RADIOLOGIST EVAL & MGMT: IMG5224

## 2021-03-23 ENCOUNTER — Other Ambulatory Visit (HOSPITAL_COMMUNITY): Payer: Self-pay | Admitting: Interventional Radiology

## 2021-03-23 DIAGNOSIS — I671 Cerebral aneurysm, nonruptured: Secondary | ICD-10-CM

## 2021-03-26 ENCOUNTER — Encounter (HOSPITAL_COMMUNITY): Payer: Self-pay | Admitting: Emergency Medicine

## 2021-03-26 ENCOUNTER — Emergency Department (HOSPITAL_COMMUNITY): Payer: Medicare Other

## 2021-03-26 ENCOUNTER — Emergency Department (HOSPITAL_COMMUNITY)
Admission: EM | Admit: 2021-03-26 | Discharge: 2021-03-26 | Disposition: A | Discharge status: Home / Self Care | Payer: Medicare Other | Attending: Emergency Medicine | Admitting: Emergency Medicine

## 2021-03-26 DIAGNOSIS — I10 Essential (primary) hypertension: Secondary | ICD-10-CM | POA: Insufficient documentation

## 2021-03-26 DIAGNOSIS — Z8679 Personal history of other diseases of the circulatory system: Secondary | ICD-10-CM

## 2021-03-26 DIAGNOSIS — M542 Cervicalgia: Secondary | ICD-10-CM | POA: Insufficient documentation

## 2021-03-26 DIAGNOSIS — Z79899 Other long term (current) drug therapy: Secondary | ICD-10-CM | POA: Insufficient documentation

## 2021-03-26 DIAGNOSIS — Z7982 Long term (current) use of aspirin: Secondary | ICD-10-CM | POA: Diagnosis not present

## 2021-03-26 DIAGNOSIS — Z853 Personal history of malignant neoplasm of breast: Secondary | ICD-10-CM | POA: Diagnosis not present

## 2021-03-26 DIAGNOSIS — R519 Headache, unspecified: Secondary | ICD-10-CM | POA: Diagnosis not present

## 2021-03-26 DIAGNOSIS — I720 Aneurysm of carotid artery: Secondary | ICD-10-CM | POA: Diagnosis not present

## 2021-03-26 LAB — BASIC METABOLIC PANEL
Anion gap: 7 (ref 5–15)
BUN: 16 mg/dL (ref 8–23)
CO2: 25 mmol/L (ref 22–32)
Calcium: 8.8 mg/dL — ABNORMAL LOW (ref 8.9–10.3)
Chloride: 106 mmol/L (ref 98–111)
Creatinine, Ser: 0.78 mg/dL (ref 0.44–1.00)
GFR, Estimated: >60 mL/min (ref >60)
Glucose, Bld: 101 mg/dL — ABNORMAL HIGH (ref 70–99)
Potassium: 3.4 mmol/L — ABNORMAL LOW (ref 3.5–5.1)
Sodium: 138 mmol/L (ref 135–145)

## 2021-03-26 LAB — TROPONIN I (HIGH SENSITIVITY): Troponin I (High Sensitivity): 3 ng/L

## 2021-03-26 IMAGING — CT CT ANGIO HEAD-NECK (W OR W/O PERF)
1 of 12 series · 5 of 35 positions shown · non-contrast
Comparison: Brain MRI and intracranial MRA [DATE].

CLINICAL DATA: 71-year-old female left side head and neck pain.
Supraclinoid left ICA aneurysm on [facility] MRA.

EXAM:
CT ANGIOGRAPHY HEAD AND NECK
TECHNIQUE: Multidetector CT imaging of the head and neck was performed using
the standard protocol during bolus administration of intravenous
contrast. Multiplanar CT image reconstructions and MIPs were
obtained to evaluate the vascular anatomy. Carotid stenosis
measurements (when applicable) are obtained utilizing NASCET
criteria, using the distal internal carotid diameter as the
denominator.

[Series 12: ax thins · axial · 0.44mm/px · z∈[-396,-185]mm · 5 of 340 slices shown]
[im 57/340  soft-tissue]
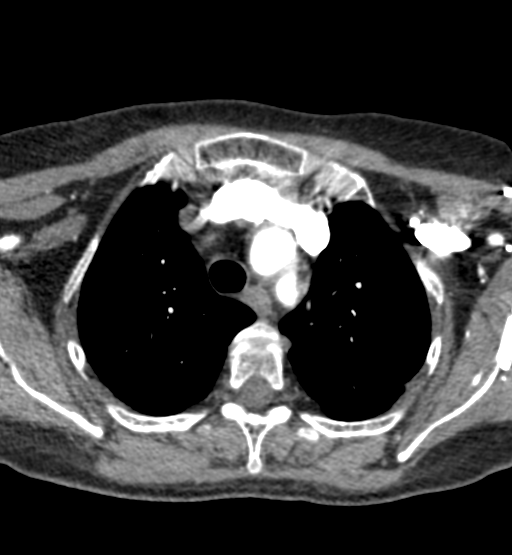
[im 114/340  bone]
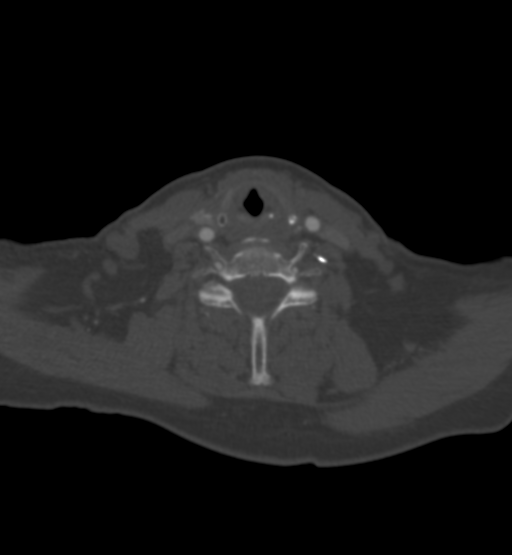
[im 170/340  soft-tissue]
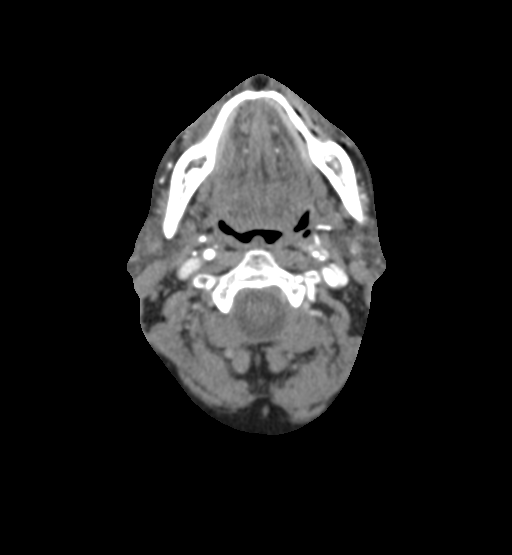
[im 227/340  bone]
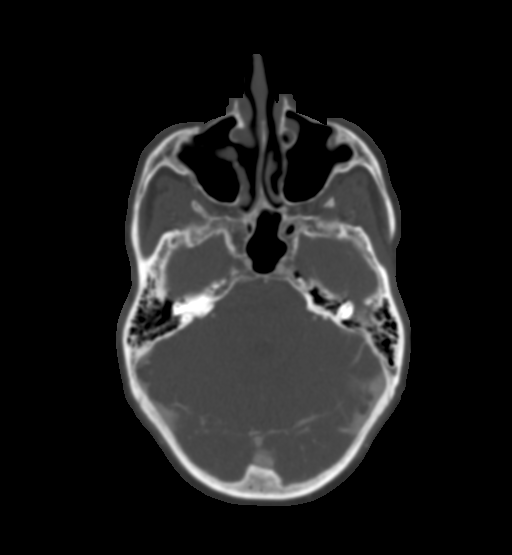
[im 283/340  soft-tissue]
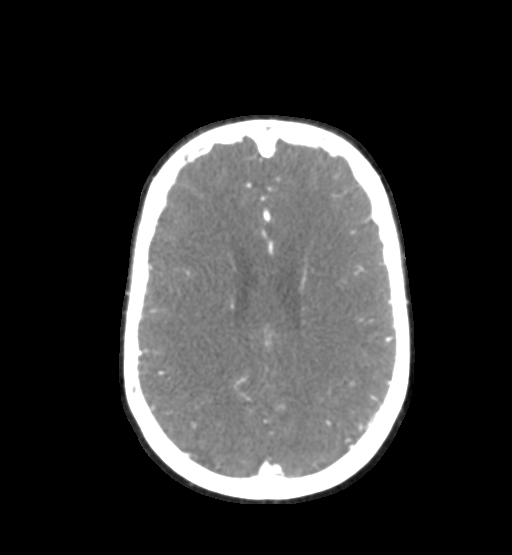

[5 of 35 positions shown; findings below may reference images not displayed]

RADIATION DOSE REDUCTION: This exam was performed according to the
departmental dose-optimization program which includes automated
exposure control, adjustment of the mA and/or kV according to
patient size and/or use of iterative reconstruction technique.

CONTRAST:  80mL OMNIPAQUE IOHEXOL 350 MG/ML SOLN
FINDINGS: CT HEAD

Brain: Expected evolution of the posterior right corona radiata
lacunar infarct seen in [REDACTED]. Chronic lacunar infarcts right
caudate nucleus, left internal capsule, and patchy additional
bilateral coronal radiata white matter hypodensity appears stable
since [REDACTED]. Questionable increased thalamic heterogeneity since
that time such as on series 3, image 14, although not well
correlated on the coronal and sagittal images.

No superimposed midline shift, ventriculomegaly, mass effect,
evidence of mass lesion, intracranial hemorrhage or evidence of
cortically based acute infarction. No cortical encephalomalacia
identified.

Calvarium and skull base: Negative.

Paranasal sinuses: Stable mild ethmoid sinus mucosal thickening
since [REDACTED]. No sinus fluid levels. Mastoids remain clear.

Orbits: No acute orbit or scalp soft tissue finding.

CTA NECK

Skeleton: Absent dentition. Normal for age visible spine. No acute
osseous abnormality identified.

Upper chest: Negative. The visible central pulmonary arteries are
enhancing and patent.

Other neck: Subcentimeter left thyroid nodule Not clinically
significant; no follow-up imaging recommended (ref: [HOSPITAL]. [DATE]): 143-50).Otherwise negative.

Aortic arch: Bovine arch configuration. Mildly tortuous arch.
Minimal arch atherosclerosis.

Right carotid system: Mildly tortuous brachiocephalic artery and
proximal right CCA without plaque or stenosis. Negative right
carotid bifurcation and cervical right ICA.

Left carotid system: Bovine, tortuous left CCA origin without plaque
or stenosis. Negative left carotid bifurcation and cervical left
ICA.

Vertebral arteries:
Normal proximal right subclavian artery and right vertebral artery
origin. Tortuous right V 2 segment. Patent right vertebral to the
skull base without plaque or stenosis.

Mildly tortuous proximal left subclavian artery without plaque or
stenosis. Normal left vertebral artery origin. Mildly non dominant
left vertebral artery with tortuous V2 segment is patent to the
skull base without plaque or stenosis.

CTA HEAD

Posterior circulation: Dominant right V4 segment. Normal distal
vertebral arteries and vertebrobasilar junction with no plaque or
stenosis. Patent bilateral PICA, AICA origins. Fetal type bilateral
PCA origins, and diminutive distal basilar artery but patent to the
SCA is and a small left P1 is present. Decreased distal [DATE] basilar
artery caliber more resembles anatomic variation than
atherosclerotic stenosis. Left PCA branches are within normal
limits. There is mild irregularity and stenosis of the right PCA P2
segment on series 17, image 19.

Anterior circulation: Both ICA siphons are patent. The right siphon
is normal with normal right posterior communicating artery.

Contralateral left siphon is patent without stenosis. There is a
saccular 5-6 mm aneurysm of the supraclinoid ICA directed medially
and slightly inferiorly with a broad aneurysm neck (series 12, image
95 and series 13, image 94. This is not associated with the left
posterior communicating artery origin.

Patent carotid termini, MCA and ACA origins. The left ACA is
dominant throughout. Anterior communicating artery is within normal
limits. A small branch or fenestration is suspected along the
lateral aspect of the proximal A2 on series 12, image 89. Bilateral
ACA branches are within normal limits.

Left MCA M1 segment is patent although with mild irregularity and
stenosis seen on series 15, image 18. Patent left MCA bifurcation
without convincing stenosis. Left MCA branches are within normal
limits. Contralateral right MCA M1 segment and bifurcation are
patent without stenosis. Right MCA branches are within normal
limits.

Venous sinuses: Patent.

Anatomic variants: Fetal type bilateral PCA origins with diminutive
basilar. Mildly dominant right vertebral artery. Dominant left ACA.

Review of the MIP images confirms the above findings
IMPRESSION: 1. Negative for large vessel occlusion. No atherosclerosis or
stenosis in the neck.

2. Positive for a 5-6 mm unruptured aneurysm of the distal Left ICA,
supraclinoid segment with broad aneurysm neck. Neuro Endovascular
(Neuro-Interventional Radiology, Endovascular Neurosurgery)
consultation is suggested to evaluate the appropriateness of
potential treatment.

3. Evidence of Intracranial Atherosclerosis, affecting the left MCA
M1 and right PCA P2 segments with mild stenosis.

4. Cerebral chronic small vessel disease with a stable CT appearance
since the [facility] MRI. No new intracranial abnormality.

## 2021-03-26 MED ORDER — SODIUM CHLORIDE 0.9 % IV BOLUS
500.0000 mL | Freq: Once | INTRAVENOUS | Status: AC
  Administered 2021-03-26: 500 mL via INTRAVENOUS

## 2021-03-26 MED ORDER — IBUPROFEN 400 MG PO TABS
600.0000 mg | ORAL_TABLET | Freq: Once | ORAL | Status: AC
  Administered 2021-03-26: 600 mg via ORAL
  Filled 2021-03-26: qty 2

## 2021-03-26 MED ORDER — IOHEXOL 350 MG/ML SOLN
80.0000 mL | Freq: Once | INTRAVENOUS | Status: AC | PRN
  Administered 2021-03-26: 80 mL via INTRAVENOUS

## 2021-03-26 NOTE — ED Provider Notes (Signed)
?[facility] ?Provider Note ? ? ?CSN: [id number] ?Arrival date & time: 03/26/21  0010 ? ?  ? ?History ? ?Chief Complaint  ?Patient presents with  ? Headache  ? Neck Pain  ? ? ?Kimberly Mccormick is a 71 y.o. female. ? ?Patient is a 71 year old female with past medical history of hypertension, GERD, breast cancer, hyperlipidemia, recent CVA and diagnosis of cerebral aneurysm.  Patient presenting today for evaluation of headache and neck pain.  This started earlier today in the absence of any injury or trauma.  She describes pain to the left temple and left upper neck with no associated weakness, visual disturbances, numbness.  She is scheduled to have her aneurysm repaired by interventional radiology in the next few days. ? ?The history is provided by the patient.  ? ?  ? ?Home Medications ?Prior to Admission medications   ?Medication Sig Start Date End Date Taking? Authorizing Provider  ?acyclovir (ZOVIRAX) 400 MG tablet Take 400 mg by mouth 2 (two) times daily.     [provider]  ?ALPRAZolam Duanne Moron) 0.5 MG tablet Take 0.5 mg by mouth as needed. 01/12/21   [provider]  ?aspirin EC 81 MG EC tablet Take 1 tablet (81 mg total) by mouth daily. Swallow whole. 01/05/21   Geradine Girt, DO  ?atorvastatin (LIPITOR) 80 MG tablet Take 1 tablet (80 mg total) by mouth daily. 01/05/21   Geradine Girt, DO  ?cetirizine (ZYRTEC) 10 MG tablet Take 10 mg by mouth daily.    [provider]  ?Cholecalciferol (VITAMIN D) 2000 UNITS tablet Take 2,000 Units by mouth daily.    [provider]  ?diclofenac Sodium (VOLTAREN) 1 % GEL Apply 2 g topically 4 (four) times daily. 01/05/21   Geradine Girt, DO  ?ezetimibe (ZETIA) 10 MG tablet Take 10 mg by mouth daily. 02/23/21   [provider]  ?ferrous sulfate 325 (65 FE) MG tablet Take 325 mg by mouth daily with breakfast.    [provider]  ?HYDROcodone-acetaminophen (NORCO/VICODIN) 5-325 MG tablet Take 1 tablet by  mouth every 6 (six) hours as needed for severe pain. 02/04/21   Ripley Fraise, MD  ?metoprolol succinate (TOPROL-XL) 25 MG 24 hr tablet Take 25 mg by mouth daily. 05/28/17   [provider]  ?omeprazole (PRILOSEC) 40 MG capsule Take 1 capsule (40 mg total) by mouth daily. 11/27/20   Harvel Quale, MD  ?ondansetron (ZOFRAN-ODT) 4 MG disintegrating tablet Take 1 tablet (4 mg total) by mouth every 8 (eight) hours as needed. 02/04/21   Ripley Fraise, MD  ?pramipexole (MIRAPEX) 0.5 MG tablet Take 1 tablet (0.5 mg total) by mouth 3 (three) times daily. 12/07/20   Star Age, MD  ?PROCTO-MED HC 2.5 % rectal cream Place rectally 2 (two) times daily as needed. 07/19/20   [provider]  ?zolpidem (AMBIEN) 10 MG tablet Take 5 mg by mouth at bedtime as needed for sleep.    [provider]  ?   ? ?Allergies    ?Eggs or egg-derived products, Other, and Penicillins   ? ?Review of Systems   ?Review of Systems  ?All other systems reviewed and are negative. ? ?Physical Exam ?Updated Vital Signs ?BP (!) 179/102 (BP Location: Left Arm)   Pulse 62   Temp 98 ?F (36.7 ?C) (Oral)   Resp 17   Ht '5\' 3"'$  (1.6 m)   Wt 60.8 kg   SpO2 100%   BMI 23.74 kg/m?  ?Physical Exam ?  Vitals and nursing note reviewed.  ?Constitutional:   ?   General: She is not in acute distress. ?   Appearance: She is well-developed. She is not diaphoretic.  ?HENT:  ?   Head: Normocephalic and atraumatic.  ?Eyes:  ?   General: No visual field deficit. ?   Extraocular Movements: Extraocular movements intact.  ?   Pupils: Pupils are equal, round, and reactive to light.  ?Neck:  ?   Comments: She has good range of motion of the neck.  There is no tenderness to palpation. ?Cardiovascular:  ?   Rate and Rhythm: Normal rate and regular rhythm.  ?   Heart sounds: No murmur heard. ?  No friction rub. No gallop.  ?Pulmonary:  ?   Effort: Pulmonary effort is normal. No respiratory distress.  ?   Breath sounds: Normal breath sounds.  No wheezing.  ?Abdominal:  ?   General: Bowel sounds are normal. There is no distension.  ?   Palpations: Abdomen is soft.  ?   Tenderness: There is no abdominal tenderness.  ?Musculoskeletal:     ?   General: Normal range of motion.  ?   Cervical back: Normal range of motion and neck supple.  ?Skin: ?   General: Skin is warm and dry.  ?Neurological:  ?   General: No focal deficit present.  ?   Mental Status: She is alert and oriented to person, place, and time. Mental status is at baseline.  ?   Cranial Nerves: No cranial nerve deficit or dysarthria.  ? ? ?ED Results / Procedures / Treatments   ?Labs ?(all labs ordered are listed, but only abnormal results are displayed) ?Labs Reviewed  ?BASIC METABOLIC PANEL  ?TROPONIN I (HIGH SENSITIVITY)  ? ? ?EKG ?None ? ?Radiology ?No results found. ? ?Procedures ?Procedures  ? ? ?Medications Ordered in ED ?Medications  ?sodium chloride 0.9 % bolus 500 mL (has no administration in time range)  ? ? ?ED Course/ Medical Decision Making/ A&P ? ?This patient presents to the ED for concern of headache and neck pain, this involves an extensive number of treatment options, and is a complaint that carries with it a high risk of complications and morbidity.  The differential diagnosis includes ruptured cerebral aneurysm, intracranial hemorrhage, migraine ? ? ?Co morbidities that complicate the patient evaluation ? ?Known cerebral aneurysm ? ? ?Additional history obtained: ? ?No additional history or external records obtained ? ? ?Lab Tests: ? ?I Ordered, and personally interpreted labs.  The pertinent results include: Unremarkable CBC and basic metabolic panel ? ? ?Imaging Studies ordered: ? ?I ordered imaging studies including CTA of the head and neck ?I independently visualized and interpreted imaging which showed no acute process.  She does have the stable 5 to 6 mm aneurysm ?I agree with the radiologist interpretation ? ? ?Cardiac Monitoring: ? ?Not performed ? ? ?Medicines ordered  and prescription drug management: ? ?I ordered medication including Motrin for pain ?Reevaluation of the patient after these medicines showed that the patient improved ?I have reviewed the patients home medicines and have made adjustments as needed ? ? ?Test Considered: ? ?No other test considered ? ? ?Critical Interventions: ? ?None ? ? ?Consultations Obtained: ? ?None obtained, but patient has appointment for tomorrow with interventional radiology as previously arranged. ? ? ?Problem List / ED Course: ? ?Patient presenting with headache and neck pain.  She has a history of recently diagnosed cerebral aneurysm and is due to have this repaired by  interventional radiology.  She is neurologically intact and CTA of the head and neck are negative for bleed or other acute abnormality.  Patient seems appropriate for discharge.  She has an appointment tomorrow with IR. ? ? ? ?Social Determinants of Health: ? ?None ? ? ? ? ?Final Clinical Impression(s) / ED Diagnoses ?Final diagnoses:  ?None  ? ? ?Rx / DC Orders ?ED Discharge Orders   ? ? None  ? ?  ? ? ?  ?Veryl Speak, MD ?03/26/21 [phone] ? ?

## 2021-03-26 NOTE — ED Triage Notes (Addendum)
Pt c/o headache and left sided neck pain since yesterday.  ? ?Pt states she has hx of left sided neck aneurysm, and is scheduled to see surgery about it Tuesday.   ?

## 2021-03-26 NOTE — Discharge Instructions (Signed)
Follow-up with interventional radiology as previously scheduled. ? ?Rotate Tylenol 1000 mg with ibuprofen 600 mg every 4 hours as needed for pain. ?

## 2021-03-27 ENCOUNTER — Other Ambulatory Visit: Payer: Self-pay

## 2021-03-27 ENCOUNTER — Ambulatory Visit (HOSPITAL_COMMUNITY)
Admission: RE | Admit: 2021-03-27 | Discharge: 2021-03-27 | Disposition: A | Discharge status: Home / Self Care | Payer: Medicare Other | Source: Ambulatory Visit | Attending: Interventional Radiology | Admitting: Interventional Radiology

## 2021-03-27 DIAGNOSIS — H905 Unspecified sensorineural hearing loss: Secondary | ICD-10-CM | POA: Diagnosis not present

## 2021-03-27 DIAGNOSIS — I671 Cerebral aneurysm, nonruptured: Secondary | ICD-10-CM

## 2021-03-27 DIAGNOSIS — R519 Headache, unspecified: Secondary | ICD-10-CM | POA: Diagnosis not present

## 2021-03-28 ENCOUNTER — Ambulatory Visit (HOSPITAL_COMMUNITY): Payer: Medicare Other | Attending: Internal Medicine | Admitting: Physical Therapy

## 2021-03-28 DIAGNOSIS — R2681 Unsteadiness on feet: Secondary | ICD-10-CM | POA: Insufficient documentation

## 2021-03-28 DIAGNOSIS — M6281 Muscle weakness (generalized): Secondary | ICD-10-CM | POA: Diagnosis not present

## 2021-03-28 DIAGNOSIS — R29818 Other symptoms and signs involving the nervous system: Secondary | ICD-10-CM | POA: Diagnosis not present

## 2021-03-28 HISTORY — PX: IR RADIOLOGIST EVAL & MGMT: IMG5224

## 2021-03-28 NOTE — Therapy (Signed)
[facility] [address] [place], [place], [address] Phone: [phone]   Fax:  [phone]  Physical Therapy Treatment  Patient Details  Name: Kimberly Mccormick MRN: [id number] Date of Birth: 05/02/1950 Referring Provider (PT): Eulogio Bear   Encounter Date: 03/28/2021   PT End of Session - 03/28/21 1650     Visit Number 18    Number of Visits 24    Date for PT Re-Evaluation 04/14/21    Authorization Type [employer] medicare    Progress Note Due on Visit 24   PN complete on visit #9   PT Start Time 4536    PT Stop Time 1726    PT Time Calculation (min) 39 min    Activity Tolerance Patient tolerated treatment well;Patient limited by fatigue    Behavior During Therapy [facility] for tasks assessed/performed             Past Medical History:  Diagnosis Date   Anemia    iron def   Aneurysm ([place])    Breast cancer ([place])    left, Status post chemotherapy and XRT   CHF (congestive heart failure) ([place])    DDD (degenerative disc disease), lumbar    Essential hypertension    Essential tremor    Genital warts    GERD (gastroesophageal reflux disease)    History of anemia    History of renal insufficiency    Hyperlipidemia    Hypertension    Insomnia    Mixed hyperlipidemia    Osteoarthritis    Otitis media    right   Parkinson's disease ([place])    Peripheral neuropathy    d/t chemo   Sinus infection 01/04/2014   Stroke [facility])     Past Surgical History:  Procedure Laterality Date   APPENDECTOMY  1966   BIOPSY  08/29/2020   Procedure: BIOPSY;  Surgeon: Harvel Quale, MD;  [facility];  Service: Gastroenterology;;   BREAST LUMPECTOMY WITH RADIOACTIVE SEED AND SENTINEL LYMPH NODE BIOPSY Left 05/06/2018   Procedure: LEFT BREAST LUMPECTOMY WITH RADIOACTIVE SEED AND LEFT DEEP AXILLARY SENTINEL LYMPH NODE BIOPSY AND BLUE DYE INJECTION;  Surgeon: Fanny Skates, MD;  Location: [facility];  Service: General;   Laterality: Left;   CATARACT EXTRACTION W/PHACO Left 01/10/2014   Procedure: CATARACT EXTRACTION PHACO AND INTRAOCULAR LENS PLACEMENT ; CDE:  4.94;  Surgeon: Williams Che, MD;  Location: [facility];  Service: Ophthalmology;  Laterality: Left;   CESAREAN SECTION  1986   CHOLECYSTECTOMY     COLONOSCOPY WITH PROPOFOL N/A 07/25/2020   Procedure: COLONOSCOPY WITH PROPOFOL;  Surgeon: Harvel Quale, MD;  [facility];  Service: Gastroenterology;  Laterality: N/A;  10:55   ESOPHAGOGASTRODUODENOSCOPY (EGD) WITH PROPOFOL N/A 08/29/2020   Procedure: ESOPHAGOGASTRODUODENOSCOPY (EGD) WITH PROPOFOL;  Surgeon: Harvel Quale, MD;  [facility];  Service: Gastroenterology;  Laterality: N/A;  12:00   IR RADIOLOGIST EVAL & MGMT  03/22/2021   IR RADIOLOGIST EVAL & MGMT  03/28/2021   MYRINGOTOMY WITH TUBE PLACEMENT Right 02/04/2017   Procedure: REVISION OF RIGHT MYRINGOTOMY WITH TUBE PLACEMENT, WITH EXAM OF LEFT EAR;  Surgeon: Leta Baptist, MD;  Location: [facility];  Service: ENT;  Laterality: Right;   NASAL SINUS SURGERY  2016   polypectomy   POLYPECTOMY  07/25/2020   Procedure: POLYPECTOMY;  Surgeon: Harvel Quale, MD;  [facility];  Service: Gastroenterology;;   POLYPECTOMY  08/29/2020  Procedure: POLYPECTOMY;  Surgeon: Harvel Quale, MD;  [facility];  Service: Gastroenterology;;   [facility] REMOVAL N/A 01/06/2019   Procedure: REMOVAL PORT-A-CATH;  Surgeon: Fanny Skates, MD;  Location: [facility];  Service: General;  Laterality: N/A;   PORTACATH PLACEMENT Right 06/16/2018   Procedure: INSERTION PORT-A-CATH;  Surgeon: Fanny Skates, MD;  Location: [facility];  Service: General;  Laterality: Right;   SUBMUCOSAL LIFTING INJECTION  08/29/2020   Procedure: SUBMUCOSAL LIFTING INJECTION;  Surgeon: Harvel Quale, MD;  [facility];  Service: Gastroenterology;;    TONSILLECTOMY  1970    There were no vitals filed for this visit.   Subjective Assessment - 03/28/21 1649     Subjective Patient says she is doing better. No falls, no pain. She was tired after last time.    Pertinent History HTN, Lt breast cancer 03/24/2018 , Parkinson    Limitations Lifting;Standing;Walking;House hold activities    How long can you sit comfortably? no problem    How long can you stand comfortably? 20 to 30 minutes now    How long can you walk comfortably? walks with a quad cane now longest she has walked has been ten minutes.  Was using a cane and could only walk for 10 minutes    [facility]    Currently in Pain? No/denies                               [facility] Adult PT Treatment/Exercise - 03/28/21 0001       Knee/Hip Exercises: Machines for Strengthening   Other Machine weighted walkouts 3plates 5 x each direction      Knee/Hip Exercises: Standing   Heel Raises Both;3 sets;10 reps    Knee Flexion Both;2 sets;15 reps    Knee Flexion Limitations 2lb    Hip Abduction Both;2 sets;15 reps    Abduction Limitations 2lb    Hip Extension Both;2 sets;15 reps    Extension Limitations 2lb    Forward Step Up Both;2 sets;10 reps;Hand Hold: 2;Step Height: 6"      Knee/Hip Exercises: Seated   Sit to Sand 2 sets;10 reps;without UE support                       PT Short Term Goals - 03/15/21 1013       PT SHORT TERM GOAL #1   Title PT to be I in HEP to improve LT LE strength to allow LT knee pain to decrease to no greater than 5/10.    Time 3    Period Weeks    Status Achieved      PT SHORT TERM GOAL #2   Title PT to be walking in her home without the use of an assistive device    Time 3    Period Weeks    Status Achieved      PT SHORT TERM GOAL #3   Title PT to be able to single leg stance on her left LE for 20 seconds to reduce risk of falling.    Time 3    Period Weeks    Status Achieved                PT Long Term Goals - 03/15/21 1025       PT LONG TERM GOAL #1   Title PT to be I  in an advance HEP to improve LT knee pain to no greater than 3/10.    Time 6    Period Weeks    Status Achieved    Target Date 02/20/21      PT LONG TERM GOAL #2   Title PT Lt LE strength to be at least 4/5 to allow pt to feel confident walking without an assistive device outside.    Baseline 2/23:  Pt gluteal mm are at  3 to 3+ making it difficult to come up from cleaning the tub, being on the floor or low lying couches.    Time 6   extend to 12 weeks as pt has not met goal and does not feel she is ready to be discharged.   Period Weeks    Status Partially Met      PT LONG TERM GOAL #3   Title Pt to have retturned to the [facility] at least 2x a week    Time 6    Period Weeks    Status Not Met      PT LONG TERM GOAL #4   Title PT  to be able to single leg stance on her left leg for at least 40 seconds to reduce her risk of falling.    Time 6    Period Weeks    Status Achieved                   Plan - 03/28/21 1725     Clinical Impression Statement Patient tolerated session well today. Progressed reps with LE strengthening activity with good response. Added weighted walkouts in multidirectional. Patient most challenged with forward direction with increased effort for balance. Patient noting increased fatigue end of session. Patient cued on footing during added weighted walkouts as well as spacing between feet during sit to stands. Patient will continue to benefit from skilled therapy services to reduce deficits and improve functional level.    Personal Factors and Comorbidities Comorbidity 3+    Comorbidities breast cancer, parkinson, HTN    Examination-Activity Limitations Carry;Lift;Locomotion Level;Reach Overhead;Squat;Stairs;Stand    Examination-Participation Restrictions Church;Cleaning;Community Activity;Laundry;Meal Prep;Shop;Yard Work    Stability/Clinical Decision Making  Evolving/Moderate complexity    PT Frequency 2x / week    PT Duration 6 weeks    PT Treatment/Interventions Therapeutic activities;Therapeutic exercise;Balance training;Patient/family education    PT Next Visit Plan Continue balance training and functional strengthening for gluteal mm.    PT Home Exercise Plan sit to stand, bridge, LT SLR and Lt sidelying abduction; 12/23 - prone hip extension, prone ham curl, sidelying clam, stand heel/toe raise; 12/27:  tandem stance and sidestep front of counter.; 1/3: quadruped UE then LE; 2/9: GTB row and shoulder extension and begin walking program.    Consulted and Agree with Plan of Care Patient             Patient will benefit from skilled therapeutic intervention in order to improve the following deficits and impairments:  Abnormal gait, Decreased activity tolerance, Decreased balance, Difficulty walking, Decreased strength, Pain  Visit Diagnosis: Muscle weakness (generalized)  Other symptoms and signs involving the nervous system  Unsteadiness on feet     Problem List Patient Active Problem List   Diagnosis Date Noted   History of herpes simplex infection 01/08/2021   Papanicolaou smear, as part of routine gynecological examination 01/08/2021   CVA (cerebral vascular accident) ([place]) 01/04/2021   Acute ischemic stroke ([place]) 01/03/2021   Hypokalemia 01/03/2021   Cerebral aneurysm 01/03/2021  Musculoskeletal pain 01/03/2021   Early satiety 11/27/2020   Chronic abdominal pain 07/13/2020   Port-A-Cath in place 06/23/2018   Malignant neoplasm of upper-inner quadrant of left breast in female, estrogen receptor positive ([place]) 04/16/2018   Essential hypertension 03/21/2015   High cholesterol 03/21/2015   Genital herpes 03/21/2015   Fecal urgency 03/21/2015   GERD (gastroesophageal reflux disease) 03/21/2015   5:28 PM, 03/28/21 Josue Hector PT DPT  Physical Therapist with [facility]  [phone]  [place], [place], [address] Phone: [phone]   Fax:  [phone]  Name: Kimberly Mccormick MRN: [id number] Date of Birth: 25-May-1950

## 2021-03-30 ENCOUNTER — Telehealth (HOSPITAL_COMMUNITY): Payer: Self-pay | Admitting: Radiology

## 2021-03-30 NOTE — Telephone Encounter (Signed)
Patient called to set up her brain aneurysm treatment with Dr. Estanislado Pandy. She needs cardiac clearance per Deveshwar from Dr. Domenic Polite prior to scheduling. She states she has an appt with Dr. Myles Gip office on March 29th. I did send Dr. Domenic Polite an in-basket message today to see if she could possibly be seen any earlier than that. Will call patient once I hear back from Dr. Domenic Polite. JM ?

## 2021-04-04 ENCOUNTER — Other Ambulatory Visit: Payer: Self-pay

## 2021-04-04 ENCOUNTER — Ambulatory Visit (HOSPITAL_COMMUNITY): Payer: Medicare Other | Admitting: Physical Therapy

## 2021-04-04 ENCOUNTER — Encounter (HOSPITAL_COMMUNITY): Payer: Self-pay | Admitting: Physical Therapy

## 2021-04-04 DIAGNOSIS — M6281 Muscle weakness (generalized): Secondary | ICD-10-CM

## 2021-04-04 DIAGNOSIS — R2681 Unsteadiness on feet: Secondary | ICD-10-CM | POA: Diagnosis not present

## 2021-04-04 DIAGNOSIS — R29818 Other symptoms and signs involving the nervous system: Secondary | ICD-10-CM

## 2021-04-04 NOTE — Therapy (Signed)
?OUTPATIENT PHYSICAL THERAPY TREATMENT NOTE ? ? ?Patient Name: Kimberly Mccormick ?MRN: [id number] ?DOB:Jun 14, 1950, 71 y.o., female ?Today's Date: 04/04/2021 ? ?PCP: Curlene Labrum, MD ?REFERRING PROVIDER: Eulogio Bear  ? ? PT End of Session - 04/04/21 1446   ? ? Visit Number 19   ? Number of Visits 24   ? Date for PT Re-Evaluation 04/14/21   ? Authorization Type [employer] medicare   ? Progress Note Due on Visit 24   PN complete on visit #9  ? PT Start Time [id number]   ? PT Stop Time 1530   ? PT Time Calculation (min) 44 min   ? Activity Tolerance Patient tolerated treatment well;Patient limited by fatigue   ? Behavior During Therapy [facility] for tasks assessed/performed   ? ?  ?  ? ?  ? ? ?Past Medical History:  ?Diagnosis Date  ? Anemia   ? iron def  ? Aneurysm ([place])   ? Breast cancer ([place])   ? left, Status post chemotherapy and XRT  ? CHF (congestive heart failure) ([place])   ? DDD (degenerative disc disease), lumbar   ? Essential hypertension   ? Essential tremor   ? Genital warts   ? GERD (gastroesophageal reflux disease)   ? History of anemia   ? History of renal insufficiency   ? Hyperlipidemia   ? Hypertension   ? Insomnia   ? Mixed hyperlipidemia   ? Osteoarthritis   ? Otitis media   ? right  ? Parkinson's disease ([place])   ? Peripheral neuropathy   ? d/t chemo  ? Sinus infection 01/04/2014  ? Stroke [facility])   ? ?Past Surgical History:  ?Procedure Laterality Date  ? APPENDECTOMY  1966  ? BIOPSY  08/29/2020  ? Procedure: BIOPSY;  Surgeon: Montez Morita, Quillian Quince, MD;  [facility];  Service: Gastroenterology;;  ? BREAST LUMPECTOMY WITH RADIOACTIVE SEED AND SENTINEL LYMPH NODE BIOPSY Left 05/06/2018  ? Procedure: LEFT BREAST LUMPECTOMY WITH RADIOACTIVE SEED AND LEFT DEEP AXILLARY SENTINEL LYMPH NODE BIOPSY AND BLUE DYE INJECTION;  Surgeon: Fanny Skates, MD;  Location: [place];  Service: General;  Laterality: Left;  ? CATARACT EXTRACTION W/PHACO Left 01/10/2014  ? Procedure: CATARACT EXTRACTION PHACO AND  INTRAOCULAR LENS PLACEMENT ; CDE:  4.94;  Surgeon: Williams Che, MD;  [facility];  Service: Ophthalmology;  Laterality: Left;  ? [place]  ? CHOLECYSTECTOMY    ? COLONOSCOPY WITH PROPOFOL N/A 07/25/2020  ? Procedure: COLONOSCOPY WITH PROPOFOL;  Surgeon: Harvel Quale, MD;  [facility];  Service: Gastroenterology;  Laterality: N/A;  10:55  ? ESOPHAGOGASTRODUODENOSCOPY (EGD) WITH PROPOFOL N/A 08/29/2020  ? Procedure: ESOPHAGOGASTRODUODENOSCOPY (EGD) WITH PROPOFOL;  Surgeon: Harvel Quale, MD;  [facility];  Service: Gastroenterology;  Laterality: N/A;  12:00  ? IR RADIOLOGIST EVAL & MGMT  03/22/2021  ? IR RADIOLOGIST EVAL & MGMT  03/28/2021  ? MYRINGOTOMY WITH TUBE PLACEMENT Right 02/04/2017  ? Procedure: REVISION OF RIGHT MYRINGOTOMY WITH TUBE PLACEMENT, WITH EXAM OF LEFT EAR;  Surgeon: Leta Baptist, MD;  Location: [place];  Service: ENT;  Laterality: Right;  ? NASAL SINUS SURGERY  2016  ? polypectomy  ? POLYPECTOMY  07/25/2020  ? Procedure: POLYPECTOMY;  Surgeon: Harvel Quale, MD;  [facility];  Service: Gastroenterology;;  ? POLYPECTOMY  08/29/2020  ? Procedure: POLYPECTOMY;  Surgeon: Harvel Quale, MD;  [facility];  Service: Gastroenterology;;  ?  PORT-A-CATH REMOVAL N/A 01/06/2019  ? Procedure: REMOVAL PORT-A-CATH;  Surgeon: Fanny Skates, MD;  Location: [place];  Service: General;  Laterality: N/A;  ? PORTACATH PLACEMENT Right 06/16/2018  ? Procedure: INSERTION PORT-A-CATH;  Surgeon: Fanny Skates, MD;  Location: [place];  Service: General;  Laterality: Right;  ? SUBMUCOSAL LIFTING INJECTION  08/29/2020  ? Procedure: SUBMUCOSAL LIFTING INJECTION;  Surgeon: Montez Morita, Quillian Quince, MD;  [facility];  Service: Gastroenterology;;  ? TONSILLECTOMY  1970  ? ?Patient Active Problem List  ? Diagnosis Date Noted  ? History of herpes simplex infection  01/08/2021  ? Papanicolaou smear, as part of routine gynecological examination 01/08/2021  ? CVA (cerebral vascular accident) ([place]) 01/04/2021  ? Acute ischemic stroke ([place]) 01/03/2021  ? Hypokalemia 01/03/2021  ? Cerebral aneurysm 01/03/2021  ? Musculoskeletal pain 01/03/2021  ? Early satiety 11/27/2020  ? Chronic abdominal pain 07/13/2020  ? Port-A-Cath in place 06/23/2018  ? Malignant neoplasm of upper-inner quadrant of left breast in female, estrogen receptor positive ([place]) 04/16/2018  ? Essential hypertension 03/21/2015  ? High cholesterol 03/21/2015  ? Genital herpes 03/21/2015  ? Fecal urgency 03/21/2015  ? GERD (gastroesophageal reflux disease) 03/21/2015  ? ? ?REFERRING DIAG: I63.9 (ICD-10-CM) - Stroke (cerebrum) (HCC) I63.9 (ICD-10-CM) - Cerebrovascular accident (CVA), unspecified mechanism ([place])  ? ?THERAPY DIAG:  ?Muscle weakness (generalized) ? ?Other symptoms and signs involving the nervous system ? ?PERTINENT HISTORY: HTN, Lt breast cancer 03/24/2018 , Parkinson  ? ?PRECAUTIONS: Fall ? ?SUBJECTIVE: No new issues. Doing well today. No pain currently.  ? ?PAIN:  ?Are you having pain? No ? ? ? ? ?TODAY'S TREATMENT:  ?04/04/21 ?Heel raises x 20 ?Marching 5lb x20 each  ?Standing hip abduction 5lb 2 x 10 each  ?Standing hip extension 5lb 2 x 10 each  ?Knee flexion 5lb 2 x 10 each  ?Powerups on 6 inch step 2 sec hold  2 x10 HHA x 1 ?  Weighted walkouts 3  plates 5 x each direction  ? ? ? ?PATIENT EDUCATION: ?Education details: on exercise form and function ?Person educated: Patient ?Education method: Explanation and Demonstration ?Education comprehension: verbalized understanding and returned demonstration ? ? ?HOME EXERCISE PROGRAM: ?sit to stand, bridge, LT SLR and Lt sidelying abduction; 12/23 - prone hip extension, prone ham curl, sidelying clam, stand heel/toe raise; 12/27:  tandem stance and sidestep front of counter.; 1/3: quadruped UE then LE; 2/9: GTB row and shoulder extension and begin walking  program.  ? ? PT Short Term Goals - 03/15/21 1013   ? ?  ? PT SHORT TERM GOAL #1  ? Title PT to be I in HEP to improve LT LE strength to allow LT knee pain to decrease to no greater than 5/10.   ? Time 3   ? Period Weeks   ? Status Achieved   ?  ? PT SHORT TERM GOAL #2  ? Title PT to be walking in her home without the use of an assistive device   ? Time 3   ? Period Weeks   ? Status Achieved   ?  ? PT SHORT TERM GOAL #3  ? Title PT to be able to single leg stance on her left LE for 20 seconds to reduce risk of falling.   ? Time 3   ? Period Weeks   ? Status Achieved   ? ?  ?  ? ?  ? ? ? PT Long Term Goals - 03/15/21 1025   ? ?  ?  PT LONG TERM GOAL #1  ? Title PT to be I in an advance HEP to improve LT knee pain to no greater than 3/10.   ? Time 6   ? Period Weeks   ? Status Achieved   ? Target Date 02/20/21   ?  ? PT LONG TERM GOAL #2  ? Title PT Lt LE strength to be at least 4/5 to allow pt to feel confident walking without an assistive device outside.   ? Baseline 2/23:  Pt gluteal mm are at  3 to 3+ making it difficult to come up from cleaning the tub, being on the floor or low lying couches.   ? Time 6   extend to 12 weeks as pt has not met goal and does not feel she is ready to be discharged.  ? Period Weeks   ? Status Partially Met   ?  ? PT LONG TERM GOAL #3  ? Title Pt to have retturned to the [facility] at least 2x a week   ? Time 6   ? Period Weeks   ? Status Not Met   ?  ? PT LONG TERM GOAL #4  ? Title PT  to be able to single leg stance on her left leg for at least 40 seconds to reduce her risk of falling.   ? Time 6   ? Period Weeks   ? Status Achieved   ? ?  ?  ? ?  ? ? ? ?Plan   ?  ?  Clinical Impression Statement Patient tolerated session well today. Added single leg power ups on 6 inch step for balance progression. Patient did very well with this. Requiring minimal cueing. Patient with some challenge during weighted walkouts toward LT with becoming slightly off balance but able to correct with therapist  assist. Increased resistance to 5lb ankle weights with standing LE strengthening. Patient will continue to benefit from skilled therapy services to reduce remaining deficits and improve functional ability.

## 2021-04-06 ENCOUNTER — Ambulatory Visit (INDEPENDENT_AMBULATORY_CARE_PROVIDER_SITE_OTHER): Payer: Medicare Other | Admitting: Cardiology

## 2021-04-06 ENCOUNTER — Encounter: Payer: Self-pay | Admitting: Cardiology

## 2021-04-06 ENCOUNTER — Other Ambulatory Visit: Payer: Self-pay

## 2021-04-06 ENCOUNTER — Ambulatory Visit (HOSPITAL_COMMUNITY): Payer: Medicare Other | Admitting: Physical Therapy

## 2021-04-06 VITALS — BP 132/84 | HR 62 | Ht 63.0 in | Wt 131.4 lb

## 2021-04-06 DIAGNOSIS — R2681 Unsteadiness on feet: Secondary | ICD-10-CM

## 2021-04-06 DIAGNOSIS — Z8673 Personal history of transient ischemic attack (TIA), and cerebral infarction without residual deficits: Secondary | ICD-10-CM | POA: Diagnosis not present

## 2021-04-06 DIAGNOSIS — M6281 Muscle weakness (generalized): Secondary | ICD-10-CM | POA: Diagnosis not present

## 2021-04-06 DIAGNOSIS — Z01810 Encounter for preprocedural cardiovascular examination: Secondary | ICD-10-CM

## 2021-04-06 DIAGNOSIS — R29818 Other symptoms and signs involving the nervous system: Secondary | ICD-10-CM | POA: Diagnosis not present

## 2021-04-06 DIAGNOSIS — I10 Essential (primary) hypertension: Secondary | ICD-10-CM | POA: Diagnosis not present

## 2021-04-06 DIAGNOSIS — E782 Mixed hyperlipidemia: Secondary | ICD-10-CM | POA: Diagnosis not present

## 2021-04-06 NOTE — Therapy (Signed)
?OUTPATIENT PHYSICAL THERAPY PROGRESS NOTE ? ? ?Patient Name: Kimberly Mccormick ?MRN: [id number] ?DOB:[birth year], 71 y.o., female ?Today's Date: 04/06/2021 ? ?PCP: Curlene Labrum, MD ?REFERRING PROVIDER: Curlene Labrum, MD ? ? PT End of Session - 04/06/21 1007   ? ? Visit Number 20   ? Number of Visits 24   ? Date for PT Re-Evaluation 04/14/21   ? Authorization Type [employer] medicare   ? Progress Note Due on Visit 24   PN complete on visit #9  ? PT Start Time [phone]   ? PT Stop Time 1026   ? PT Time Calculation (min) 35 min   ? Activity Tolerance Patient tolerated treatment well;Patient limited by fatigue   ? Behavior During Therapy [facility] for tasks assessed/performed   ? ?  ?  ? ?  ? ? ?Past Medical History:  ?Diagnosis Date  ? Aneurysm of left internal carotid artery   ? Small supraclinoid 5 mm  ? Breast cancer ([place])   ? Left s/p lumpectomy (05/06/2018) and adjuvant chemotherapy (06/23/2018) with Adriamycin and Cytoxan x4 followed by Taxol weekly x12  ? DDD (degenerative disc disease), lumbar   ? Essential hypertension   ? Genital warts   ? GERD (gastroesophageal reflux disease)   ? History of anemia   ? History of renal insufficiency   ? Hypertension   ? Insomnia   ? Iron deficiency anemia   ? Ischemic stroke ([place])   ? December 2022  ? Mixed hyperlipidemia   ? Osteoarthritis   ? Parkinson's disease ([place])   ? Peripheral neuropathy   ? Related to chemotherapy  ? ?Past Surgical History:  ?Procedure Laterality Date  ? APPENDECTOMY  1966  ? BIOPSY  08/29/2020  ? Procedure: BIOPSY;  Surgeon: Montez Morita, Quillian Quince, MD;  [facility];  Service: Gastroenterology;;  ? BREAST LUMPECTOMY WITH RADIOACTIVE SEED AND SENTINEL LYMPH NODE BIOPSY Left 05/06/2018  ? Procedure: LEFT BREAST LUMPECTOMY WITH RADIOACTIVE SEED AND LEFT DEEP AXILLARY SENTINEL LYMPH NODE BIOPSY AND BLUE DYE INJECTION;  Surgeon: Fanny Skates, MD;  Location: [place];  Service: General;  Laterality: Left;  ? CATARACT EXTRACTION W/PHACO  Left 01/10/2014  ? Procedure: CATARACT EXTRACTION PHACO AND INTRAOCULAR LENS PLACEMENT ; CDE:  4.94;  Surgeon: Williams Che, MD;  [facility];  Service: Ophthalmology;  Laterality: Left;  ? [place]  ? CHOLECYSTECTOMY    ? COLONOSCOPY WITH PROPOFOL N/A 07/25/2020  ? Procedure: COLONOSCOPY WITH PROPOFOL;  Surgeon: Harvel Quale, MD;  [facility];  Service: Gastroenterology;  Laterality: N/A;  10:55  ? ESOPHAGOGASTRODUODENOSCOPY (EGD) WITH PROPOFOL N/A 08/29/2020  ? Procedure: ESOPHAGOGASTRODUODENOSCOPY (EGD) WITH PROPOFOL;  Surgeon: Harvel Quale, MD;  [facility];  Service: Gastroenterology;  Laterality: N/A;  12:00  ? IR RADIOLOGIST EVAL & MGMT  03/22/2021  ? IR RADIOLOGIST EVAL & MGMT  03/28/2021  ? MYRINGOTOMY WITH TUBE PLACEMENT Right 02/04/2017  ? Procedure: REVISION OF RIGHT MYRINGOTOMY WITH TUBE PLACEMENT, WITH EXAM OF LEFT EAR;  Surgeon: Leta Baptist, MD;  Location: [place];  Service: ENT;  Laterality: Right;  ? NASAL SINUS SURGERY  2016  ? polypectomy  ? POLYPECTOMY  07/25/2020  ? Procedure: POLYPECTOMY;  Surgeon: Harvel Quale, MD;  [facility];  Service: Gastroenterology;;  ? POLYPECTOMY  08/29/2020  ? Procedure: POLYPECTOMY;  Surgeon: Harvel Quale, MD;  [facility];  Service: Gastroenterology;;  ? PORT-A-CATH REMOVAL N/A  01/06/2019  ? Procedure: REMOVAL PORT-A-CATH;  Surgeon: Fanny Skates, MD;  Location: [place];  Service: General;  Laterality: N/A;  ? PORTACATH PLACEMENT Right 06/16/2018  ? Procedure: INSERTION PORT-A-CATH;  Surgeon: Fanny Skates, MD;  Location: [place];  Service: General;  Laterality: Right;  ? SUBMUCOSAL LIFTING INJECTION  08/29/2020  ? Procedure: SUBMUCOSAL LIFTING INJECTION;  Surgeon: Montez Morita, Quillian Quince, MD;  [facility];  Service: Gastroenterology;;  ? TONSILLECTOMY  1970  ? ?Patient Active Problem List  ?  Diagnosis Date Noted  ? History of herpes simplex infection 01/08/2021  ? Papanicolaou smear, as part of routine gynecological examination 01/08/2021  ? CVA (cerebral vascular accident) ([place]) 01/04/2021  ? Acute ischemic stroke ([place]) 01/03/2021  ? Hypokalemia 01/03/2021  ? Cerebral aneurysm 01/03/2021  ? Musculoskeletal pain 01/03/2021  ? Early satiety 11/27/2020  ? Chronic abdominal pain 07/13/2020  ? Port-A-Cath in place 06/23/2018  ? Malignant neoplasm of upper-inner quadrant of left breast in female, estrogen receptor positive ([place]) 04/16/2018  ? Essential hypertension 03/21/2015  ? Hyperlipidemia 03/21/2015  ? Genital herpes 03/21/2015  ? Fecal urgency 03/21/2015  ? GERD (gastroesophageal reflux disease) 03/21/2015  ? ? ?REFERRING DIAG: I63.9 (ICD-10-CM) - Stroke (cerebrum) (HCC) I63.9 (ICD-10-CM) - Cerebrovascular accident (CVA), unspecified mechanism ([place]) ? ?THERAPY DIAG:  ?Muscle weakness (generalized) ? ?Other symptoms and signs involving the nervous system ? ?Unsteadiness on feet ? ?PERTINENT HISTORY: HTN, Lt breast cancer 03/24/2018 , Parkinson  ? ?PRECAUTIONS: Fall ? ?SUBJECTIVE: Patient estimates herself to be at about 90% of the level of function she would expect/like. ? ?PAIN:  ?Are you having pain? No ? ? ?OBJECTIVE: ? ? ?TODAY'S TREATMENT:  ?NuStep x8.74min ?  ? TESTS AND MEASURES: ?   ?FOTO: 74% ?Lt SLS: 7.41s ?30s STS: 13 ?2MWT: 242ft ? ? 04/06/21 0001  ?Strength  ?Right Hip Extension 4-/5  ?Right Hip ABduction 5/5  ?Left Hip Flexion 4/5  ?Left Hip Extension 3+/5  ?Left Hip ABduction 4+/5  ? ?  ?PATIENT EDUCATION: ?Education details: on exercise form and function ?Person educated: Patient ?Education method: Explanation and Demonstration ?Education comprehension: verbalized understanding and returned demonstration ?  ?  ?HOME EXERCISE PROGRAM: ?sit to stand, bridge, LT SLR and Lt sidelying abduction; 12/23 - prone hip extension, prone ham curl, sidelying clam, stand heel/toe raise; 12/27:  tandem  stance and sidestep front of counter.; 1/3: quadruped UE then LE; 2/9: GTB row and shoulder extension and begin walking program.  ?  ?  PT Short Term Goals - 03/15/21 1013   ?  ?    ?     ?  PT SHORT TERM GOAL #1  ?  Title PT to be I in HEP to improve LT LE strength to allow LT knee pain to decrease to no greater than 5/10.   ?  Time 3   ?  Period Weeks   ?  Status Achieved   ?     ?  PT SHORT TERM GOAL #2  ?  Title PT to be walking in her home without the use of an assistive device   ?  Time 3   ?  Period Weeks   ?  Status Achieved   ?     ?  PT SHORT TERM GOAL #3  ?  Title PT to be able to single leg stance on her left LE for 20 seconds to reduce risk of falling.   ?  Time 3   ?  Period Weeks   ?  Status Achieved   ?  ?   ?  ?  ?   ?  ?  ?  PT Long Term Goals - 03/15/21 1025   ?  ?    ?     ?  PT LONG TERM GOAL #1  ?  Title PT to be I in an advance HEP to improve LT knee pain to no greater than 3/10.   ?  Time 6   ?  Period Weeks   ?  Status Achieved   ?  Target Date 02/20/21   ?     ?  PT LONG TERM GOAL #2  ?  Title PT Lt LE strength to be at least 4/5 to allow pt to feel confident walking without an assistive device outside.   ?  Baseline 2/23:  Pt gluteal mm are at  3 to 3+ making it difficult to come up from cleaning the tub, being on the floor or low lying couches.   ?  Time 6   extend to 12 weeks as pt has not met goal and does not feel she is ready to be discharged.  ?  Period Weeks   ?  Status Partially Met   ?     ?  PT LONG TERM GOAL #3  ?  Title Pt to have retturned to the [facility] at least 2x a week   ?  Time 6   ?  Period Weeks   ?  Status Not Met   ?     ?  PT LONG TERM GOAL #4  ?  Title PT  to be able to single leg stance on her left leg for at least 40 seconds to reduce her risk of falling.   ?  Time 6   ?  Period Weeks   ?  Status Achieved   ?  ?   ?  ?  ?   ?  ?  ?  ?Plan   ?  ?  Clinical Impression Statement Progress note performed during today's session and demonstrated an improvement in nearly  every category evaluated. Gait speed has improve based on the results of the 2MWT even without the use of her cane. STS transfer speed has improved as well. BLE MMT indicated between low to moderate level imp

## 2021-04-06 NOTE — Patient Instructions (Addendum)
Medication Instructions:  ?Your physician recommends that you continue on your current medications as directed. Please refer to the Current Medication list given to you today. ? ?Labwork: ?Your physician recommends that you return for a FASTING lipid profile as soon as possible. Please do not eat or drink for at least 8 hours when you have this done. You may take your medications that morning with a sip of water. ?[facility] ? ?Testing/Procedures: ?none ? ?Follow-Up: ?Your physician recommends that you schedule a follow-up appointment in: 6 months ? ?Any Other Special Instructions Will Be Listed Below (If Applicable). ? ?If you need a refill on your cardiac medications before your next appointment, please call your pharmacy. ?

## 2021-04-06 NOTE — Progress Notes (Signed)
? ? ?Cardiology Office Note ? ?Date: 04/06/2021  ? ?ID: Kimberly Mccormick, DOB 10-28-1950, MRN [id number] ? ?PCP:  Curlene Labrum, MD  ?Cardiologist:  Rozann Lesches, MD ?Electrophysiologist:  None  ? ?Chief Complaint  ?Patient presents with  ? Cardiac follow-up  ? ? ?History of Present Illness: ?Kimberly Mccormick is a 71 y.o. female last seen in January 2021.  She is referred back to the office by Dr. Estanislado Pandy, I reviewed extensive interval records.  She was hospitalized in December 2022 with acute ischemic stroke involving the right corona radiata by brain MRI.  She had no cardiac arrhythmias documented by telemetry, LVEF was normal range by echocardiography, and she had no significant extracranial carotid artery disease.  She was also found to have a 5 mm aneurysm of the supraclinoid left ICA.  She was treated with aspirin and statin therapy.  Subsequent outpatient 30-day cardiac monitor did not show any atrial fibrillation. ? ?She continues to follow with neurology, saw Dr. Rexene Alberts in February, I reviewed the note.  She is also followed for Parkinson's disease.  She was also referred to see Dr. Estanislado Pandy for consideration of IR repair of the small supraclinoid aneurysm of the left ICA. ? ?She tells me that she has been gaining strength, working with PT.  Does not report any exertional chest pain and has NYHA class II dyspnea at baseline.  No palpitations or syncope. ? ?I went over her medications.  She states that she had to come off of Lipitor due to difficulty swallowing the pills.  She has been on Zetia and has not yet had her cholesterol rechecked since December at which point her LDL was 166. ? ?I reviewed her ECG from December 2022. ? ?Past Medical History:  ?Diagnosis Date  ? Aneurysm of left internal carotid artery   ? Small supraclinoid 5 mm  ? Breast cancer ([place])   ? Left s/p lumpectomy (05/06/2018) and adjuvant chemotherapy (06/23/2018) with Adriamycin and Cytoxan x4 followed by Taxol weekly x12  ? DDD  (degenerative disc disease), lumbar   ? Essential hypertension   ? Genital warts   ? GERD (gastroesophageal reflux disease)   ? History of anemia   ? History of renal insufficiency   ? Hypertension   ? Insomnia   ? Iron deficiency anemia   ? Ischemic stroke ([place])   ? December 2022  ? Mixed hyperlipidemia   ? Osteoarthritis   ? Parkinson's disease ([place])   ? Peripheral neuropathy   ? Related to chemotherapy  ? ? ?Past Surgical History:  ?Procedure Laterality Date  ? APPENDECTOMY  1966  ? BIOPSY  08/29/2020  ? Procedure: BIOPSY;  Surgeon: Montez Morita, Quillian Quince, MD;  [facility];  Service: Gastroenterology;;  ? BREAST LUMPECTOMY WITH RADIOACTIVE SEED AND SENTINEL LYMPH NODE BIOPSY Left 05/06/2018  ? Procedure: LEFT BREAST LUMPECTOMY WITH RADIOACTIVE SEED AND LEFT DEEP AXILLARY SENTINEL LYMPH NODE BIOPSY AND BLUE DYE INJECTION;  Surgeon: Fanny Skates, MD;  Location: [facility];  Service: General;  Laterality: Left;  ? CATARACT EXTRACTION W/PHACO Left 01/10/2014  ? Procedure: CATARACT EXTRACTION PHACO AND INTRAOCULAR LENS PLACEMENT ; CDE:  4.94;  Surgeon: Williams Che, MD;  [facility];  Service: Ophthalmology;  Laterality: Left;  ? [place]  ? CHOLECYSTECTOMY    ? COLONOSCOPY WITH PROPOFOL N/A 07/25/2020  ? Procedure: COLONOSCOPY WITH PROPOFOL;  Surgeon: Harvel Quale, MD;  [facility];  Service: Gastroenterology;  Laterality:  N/A;  10:55  ? ESOPHAGOGASTRODUODENOSCOPY (EGD) WITH PROPOFOL N/A 08/29/2020  ? Procedure: ESOPHAGOGASTRODUODENOSCOPY (EGD) WITH PROPOFOL;  Surgeon: Harvel Quale, MD;  [facility];  Service: Gastroenterology;  Laterality: N/A;  12:00  ? IR RADIOLOGIST EVAL & MGMT  03/22/2021  ? IR RADIOLOGIST EVAL & MGMT  03/28/2021  ? MYRINGOTOMY WITH TUBE PLACEMENT Right 02/04/2017  ? Procedure: REVISION OF RIGHT MYRINGOTOMY WITH TUBE PLACEMENT, WITH EXAM OF LEFT EAR;  Surgeon: Leta Baptist, MD;  Location: [facility];  Service: ENT;  Laterality: Right;  ? NASAL SINUS SURGERY  2016  ? polypectomy  ? POLYPECTOMY  07/25/2020  ? Procedure: POLYPECTOMY;  Surgeon: Harvel Quale, MD;  [facility];  Service: Gastroenterology;;  ? POLYPECTOMY  08/29/2020  ? Procedure: POLYPECTOMY;  Surgeon: Harvel Quale, MD;  [facility];  Service: Gastroenterology;;  ? PORT-A-CATH REMOVAL N/A 01/06/2019  ? Procedure: REMOVAL PORT-A-CATH;  Surgeon: Fanny Skates, MD;  Location: [facility];  Service: General;  Laterality: N/A;  ? PORTACATH PLACEMENT Right 06/16/2018  ? Procedure: INSERTION PORT-A-CATH;  Surgeon: Fanny Skates, MD;  Location: [place] [facility];  Service: General;  Laterality: Right;  ? SUBMUCOSAL LIFTING INJECTION  08/29/2020  ? Procedure: SUBMUCOSAL LIFTING INJECTION;  Surgeon: Montez Morita, Quillian Quince, MD;  [facility];  Service: Gastroenterology;;  ? TONSILLECTOMY  1970  ? ? ?Current Outpatient Medications  ?Medication Sig Dispense Refill  ? acyclovir (ZOVIRAX) 400 MG tablet Take 400 mg by mouth 2 (two) times daily.     ? ALPRAZolam (XANAX) 0.5 MG tablet Take 0.5 mg by mouth as needed.    ? aspirin EC 81 MG EC tablet Take 1 tablet (81 mg total) by mouth daily. Swallow whole. 30 tablet 11  ? cetirizine (ZYRTEC) 10 MG tablet Take 10 mg by mouth daily.    ? Cholecalciferol (VITAMIN D) 2000 UNITS tablet Take 2,000 Units by mouth daily.    ? diclofenac Sodium (VOLTAREN) 1 % GEL Apply 2 g topically 4 (four) times daily.    ? ezetimibe (ZETIA) 10 MG tablet Take 10 mg by mouth daily.    ? ferrous sulfate 325 (65 FE) MG tablet Take 325 mg by mouth daily with breakfast.    ? HYDROcodone-acetaminophen (NORCO/VICODIN) 5-325 MG tablet Take 1 tablet by mouth every 6 (six) hours as needed for severe pain. 10 tablet 0  ? metoprolol succinate (TOPROL-XL) 25 MG 24 hr tablet Take 25 mg by mouth daily.  3  ? omeprazole (PRILOSEC) 40 MG capsule Take 1 capsule (40 mg  total) by mouth daily. 90 capsule 3  ? pramipexole (MIRAPEX) 0.5 MG tablet Take 1 tablet (0.5 mg total) by mouth 3 (three) times daily. 270 tablet 3  ? PROCTO-MED HC 2.5 % rectal cream Place rectally 2 (two) times daily as needed.    ? zolpidem (AMBIEN) 10 MG tablet Take 5 mg by mouth at bedtime as needed for sleep.    ? ?No current facility-administered medications for this visit.  ? ?Facility-Administered Medications Ordered in Other Visits  ?Medication Dose Route Frequency Provider Last Rate Last Admin  ? sodium chloride flush (NS) 0.9 % injection 10 mL  10 mL Intracatheter PRN Nicholas Lose, MD   10 mL at 08/04/18 1518  ? ?Allergies:  Eggs or egg-derived products, Other, and Penicillins  ? ?Social History: The patient  reports that she has never smoked. She has never used smokeless tobacco. She reports that she  does not drink alcohol and does not use drugs.  ? ?Family History: The patient's family history includes Breast cancer in her sister and sister; Heart attack in her father; Hypertension in her brother and sister; Hypothyroidism in her sister; Lupus in her mother; Parkinson's disease in her paternal aunt; Prostate cancer in her father.  ? ?ROS: No orthopnea or PND. ? ?Physical Exam: ?VS:  BP 132/84   Pulse 62   Ht '5\' 3"'$  (1.6 m)   Wt 131 lb 6.4 oz (59.6 kg)   SpO2 100%   BMI 23.28 kg/m? , BMI Body mass index is 23.28 kg/m?. ? ?Wt Readings from Last 3 Encounters:  ?04/06/21 131 lb 6.4 oz (59.6 kg)  ?03/26/21 134 lb (60.8 kg)  ?03/14/21 132 lb 12.8 oz (60.2 kg)  ?  ?General: Patient appears comfortable at rest. ?HEENT: Conjunctiva and lids normal, wearing a mask. ?Neck: Supple, no elevated JVP or carotid bruits, no thyromegaly. ?Lungs: Clear to auscultation, nonlabored breathing at rest. ?Cardiac: Regular rate and rhythm, no S3 or significant systolic murmur, no pericardial rub. ?Abdomen: Soft, nontender, bowel sounds present. ?Extremities: No pitting edema, distal pulses 2+. ?Skin: Warm and  dry. ?Musculoskeletal: No kyphosis. ?Neuropsychiatric: Alert and oriented x3, affect grossly appropriate. ? ?ECG:  An ECG dated 01/03/2021 was personally reviewed today and demonstrated:  Sinus rhythm with frequent PVCs, nons

## 2021-04-06 NOTE — Progress Notes (Signed)
?   04/06/21 0001  ?Strength  ?Right Hip Extension 4-/5  ? ? ?

## 2021-04-09 ENCOUNTER — Encounter (HOSPITAL_COMMUNITY): Payer: Self-pay | Admitting: Physical Therapy

## 2021-04-09 ENCOUNTER — Other Ambulatory Visit: Payer: Self-pay

## 2021-04-09 ENCOUNTER — Ambulatory Visit (HOSPITAL_COMMUNITY): Payer: Medicare Other | Admitting: Physical Therapy

## 2021-04-09 DIAGNOSIS — R2681 Unsteadiness on feet: Secondary | ICD-10-CM

## 2021-04-09 DIAGNOSIS — M6281 Muscle weakness (generalized): Secondary | ICD-10-CM | POA: Diagnosis not present

## 2021-04-09 DIAGNOSIS — R29818 Other symptoms and signs involving the nervous system: Secondary | ICD-10-CM | POA: Diagnosis not present

## 2021-04-09 NOTE — Therapy (Signed)
?OUTPATIENT PHYSICAL THERAPY TREATMENT NOTE ? ? ?Patient Name: Kimberly Mccormick ?MRN: [id number] ?DOB:[birth year], 71 y.o., female ?Today's Date: 04/09/2021 ? ?PCP: Curlene Labrum, MD ?REFERRING PROVIDER: Curlene Labrum, MD ? ? PT End of Session - 04/09/21 1028   ? ? Visit Number 21   ? Number of Visits 24   ? Date for PT Re-Evaluation 04/14/21   ? Authorization Type [employer] medicare   ? Progress Note Due on Visit 24   PN complete on visit #9  ? PT Start Time 1032   ? PT Stop Time 3748   ? PT Time Calculation (min) 40 min   ? Activity Tolerance Patient tolerated treatment well;Patient limited by fatigue   ? Behavior During Therapy [facility] for tasks assessed/performed   ? ?  ?  ? ?  ? ? ?Past Medical History:  ?Diagnosis Date  ? Aneurysm of left internal carotid artery   ? Small supraclinoid 5 mm  ? Breast cancer ([place])   ? Left s/p lumpectomy (05/06/2018) and adjuvant chemotherapy (06/23/2018) with Adriamycin and Cytoxan x4 followed by Taxol weekly x12  ? DDD (degenerative disc disease), lumbar   ? Essential hypertension   ? Genital warts   ? GERD (gastroesophageal reflux disease)   ? History of anemia   ? History of renal insufficiency   ? Hypertension   ? Insomnia   ? Iron deficiency anemia   ? Ischemic stroke ([place])   ? December 2022  ? Mixed hyperlipidemia   ? Osteoarthritis   ? Parkinson's disease ([place])   ? Peripheral neuropathy   ? Related to chemotherapy  ? ?Past Surgical History:  ?Procedure Laterality Date  ? APPENDECTOMY  1966  ? BIOPSY  08/29/2020  ? Procedure: BIOPSY;  Surgeon: Montez Morita, Quillian Quince, MD;  [facility];  Service: Gastroenterology;;  ? BREAST LUMPECTOMY WITH RADIOACTIVE SEED AND SENTINEL LYMPH NODE BIOPSY Left 05/06/2018  ? Procedure: LEFT BREAST LUMPECTOMY WITH RADIOACTIVE SEED AND LEFT DEEP AXILLARY SENTINEL LYMPH NODE BIOPSY AND BLUE DYE INJECTION;  Surgeon: Fanny Skates, MD;  Location: [facility];  Service: General;  Laterality: Left;  ? CATARACT EXTRACTION W/PHACO  Left 01/10/2014  ? Procedure: CATARACT EXTRACTION PHACO AND INTRAOCULAR LENS PLACEMENT ; CDE:  4.94;  Surgeon: Williams Che, MD;  [facility];  Service: Ophthalmology;  Laterality: Left;  ? [place]  ? CHOLECYSTECTOMY    ? COLONOSCOPY WITH PROPOFOL N/A 07/25/2020  ? Procedure: COLONOSCOPY WITH PROPOFOL;  Surgeon: Harvel Quale, MD;  [facility];  Service: Gastroenterology;  Laterality: N/A;  10:55  ? ESOPHAGOGASTRODUODENOSCOPY (EGD) WITH PROPOFOL N/A 08/29/2020  ? Procedure: ESOPHAGOGASTRODUODENOSCOPY (EGD) WITH PROPOFOL;  Surgeon: Harvel Quale, MD;  [facility];  Service: Gastroenterology;  Laterality: N/A;  12:00  ? IR RADIOLOGIST EVAL & MGMT  03/22/2021  ? IR RADIOLOGIST EVAL & MGMT  03/28/2021  ? MYRINGOTOMY WITH TUBE PLACEMENT Right 02/04/2017  ? Procedure: REVISION OF RIGHT MYRINGOTOMY WITH TUBE PLACEMENT, WITH EXAM OF LEFT EAR;  Surgeon: Leta Baptist, MD;  Location: [place];  Service: ENT;  Laterality: Right;  ? NASAL SINUS SURGERY  2016  ? polypectomy  ? POLYPECTOMY  07/25/2020  ? Procedure: POLYPECTOMY;  Surgeon: Harvel Quale, MD;  [facility];  Service: Gastroenterology;;  ? POLYPECTOMY  08/29/2020  ? Procedure: POLYPECTOMY;  Surgeon: Harvel Quale, MD;  [facility];  Service: Gastroenterology;;  ? PORT-A-CATH REMOVAL N/A  01/06/2019  ? Procedure: REMOVAL PORT-A-CATH;  Surgeon: Fanny Skates, MD;  Location: [facility];  Service: General;  Laterality: N/A;  ? PORTACATH PLACEMENT Right 06/16/2018  ? Procedure: INSERTION PORT-A-CATH;  Surgeon: Fanny Skates, MD;  Location: [facility];  Service: General;  Laterality: Right;  ? SUBMUCOSAL LIFTING INJECTION  08/29/2020  ? Procedure: SUBMUCOSAL LIFTING INJECTION;  Surgeon: Montez Morita, Quillian Quince, MD;  [facility];  Service: Gastroenterology;;  ? TONSILLECTOMY  1970  ? ?Patient Active Problem List  ?  Diagnosis Date Noted  ? History of herpes simplex infection 01/08/2021  ? Papanicolaou smear, as part of routine gynecological examination 01/08/2021  ? CVA (cerebral vascular accident) ([place]) 01/04/2021  ? Acute ischemic stroke ([place]) 01/03/2021  ? Hypokalemia 01/03/2021  ? Cerebral aneurysm 01/03/2021  ? Musculoskeletal pain 01/03/2021  ? Early satiety 11/27/2020  ? Chronic abdominal pain 07/13/2020  ? Port-A-Cath in place 06/23/2018  ? Malignant neoplasm of upper-inner quadrant of left breast in female, estrogen receptor positive ([place]) 04/16/2018  ? Essential hypertension 03/21/2015  ? Hyperlipidemia 03/21/2015  ? Genital herpes 03/21/2015  ? Fecal urgency 03/21/2015  ? GERD (gastroesophageal reflux disease) 03/21/2015  ? ? ?REFERRING DIAG: I63.9 (ICD-10-CM) - Stroke (cerebrum) (HCC) I63.9 (ICD-10-CM) - Cerebrovascular accident (CVA), unspecified mechanism ([place]) ? ?THERAPY DIAG:  ?Muscle weakness (generalized) ? ?Other symptoms and signs involving the nervous system ? ?Unsteadiness on feet ? ?PERTINENT HISTORY: HTN, Lt breast cancer 03/24/2018 , Parkinson  ? ?PRECAUTIONS: Fall ? ?SUBJECTIVE: Patient reports that she is doing well and has not had any falls since the previous session. She says that she has not even had any close calls as she has removed some of the shag rugs that had been causing an issue before. ? ?PAIN:  ?Are you having pain? No ? ? ? ? ? ?OBJECTIVE: ?  ?  ?TODAY'S TREATMENT:  ?-NuStep x40mn ?-Normal ambulation 2226f?-Ambulation with cervical rotation w/ distant target 22044f-Ambulation with cervical flex/ext w/ distant target 220f48fAmbulation with cervical sidebending w/ distant target 220ft58fmbulation with cervical rotation w/ HH target 220ft 79fbulation with cervical lex/ext w/ HH target 220ft ?65fulation with cervical sidebending w/ HH target 220ft ? 32f balance on black foam 2x15s each ?-Normal stance on foam with EC and cervical rotation 1x45s ?-Normal stance on foam with EC and  cervical flex/ext 1x45s ?-Normal stance on foam with EC and cervical sidebending 1x45s ?  ?            TESTS AND MEASURES(04/06/2021): ?                         ?FOTO: 74% ?Lt SLS: 7.41s ?30s STS: 13 ?2MWT: 275ft ?  40f3/17/23 0001  ?Strength  ?Right Hip Extension 4-/5  ?Right Hip ABduction 5/5  ?Left Hip Flexion 4/5  ?Left Hip Extension 3+/5  ?Left Hip ABduction 4+/5  ?  ?  ?PATIENT EDUCATION: ?Education details: on exercise form and function ?Person educated: Patient ?Education method: Explanation and Demonstration ?Education comprehension: verbalized understanding and returned demonstration ?  ?  ?HOME EXERCISE PROGRAM: ?sit to stand, bridge, LT SLR and Lt sidelying abduction; 12/23 - prone hip extension, prone ham curl, sidelying clam, stand heel/toe raise; 12/27:  tandem stance and sidestep front of counter.; 1/3: quadruped UE then LE; 2/9: GTB row and shoulder extension and begin walking program.  ?  ?  PT Short Term Goals - 03/15/21 1013   ?  ?       ?       ?  PT SHORT TERM GOAL #1  ?  Title PT to be I in HEP to improve LT LE strength to allow LT knee pain to decrease to no greater than 5/10.   ?  Time 3   ?  Period Weeks   ?  Status Achieved   ?       ?  PT SHORT TERM GOAL #2  ?  Title PT to be walking in her home without the use of an assistive device   ?  Time 3   ?  Period Weeks   ?  Status Achieved   ?       ?  PT SHORT TERM GOAL #3  ?  Title PT to be able to single leg stance on her left LE for 20 seconds to reduce risk of falling.   ?  Time 3   ?  Period Weeks   ?  Status Achieved   ?  ?   ?  ?  ?   ?  ?  ?  PT Long Term Goals - 03/15/21 1025   ?  ?       ?       ?  PT LONG TERM GOAL #1  ?  Title PT to be I in an advance HEP to improve LT knee pain to no greater than 3/10.   ?  Time 6   ?  Period Weeks   ?  Status Achieved   ?  Target Date 02/20/21   ?       ?  PT LONG TERM GOAL #2  ?  Title PT Lt LE strength to be at least 4/5 to allow pt to feel confident walking without an assistive device outside.    ?  Baseline 2/23:  Pt gluteal mm are at  3 to 3+ making it difficult to come up from cleaning the tub, being on the floor or low lying couches.   ?  Time 6   extend to 12 weeks as pt has not met goal and

## 2021-04-10 DIAGNOSIS — E782 Mixed hyperlipidemia: Secondary | ICD-10-CM | POA: Diagnosis not present

## 2021-04-11 ENCOUNTER — Ambulatory Visit (HOSPITAL_COMMUNITY): Payer: Medicare Other | Admitting: Physical Therapy

## 2021-04-11 ENCOUNTER — Other Ambulatory Visit: Payer: Self-pay

## 2021-04-11 DIAGNOSIS — M6281 Muscle weakness (generalized): Secondary | ICD-10-CM

## 2021-04-11 DIAGNOSIS — R2681 Unsteadiness on feet: Secondary | ICD-10-CM | POA: Diagnosis not present

## 2021-04-11 DIAGNOSIS — R29818 Other symptoms and signs involving the nervous system: Secondary | ICD-10-CM | POA: Diagnosis not present

## 2021-04-11 NOTE — Therapy (Signed)
?OUTPATIENT PHYSICAL THERAPY TREATMENT NOTE ? ? ?Patient Name: Kimberly Mccormick ?MRN: [id number] ?DOB:09-01-1950, 71 y.o., female ?Today's Date: 04/11/2021 ? ?PCP: Curlene Labrum, MD ?REFERRING PROVIDER: Curlene Labrum, MD ? ? PT End of Session - 04/11/21 1119   ? ? Visit Number 22   ? Number of Visits 24   ? Date for PT Re-Evaluation 04/14/21   ? Authorization Type [employer] medicare   ? Progress Note Due on Visit 24   PN complete on visit #9  ? PT Start Time 1121   ? PT Stop Time 1154   ? PT Time Calculation (min) 33 min   ? Activity Tolerance Patient tolerated treatment well;Patient limited by fatigue   ? Behavior During Therapy [facility] for tasks assessed/performed   ? ?  ?  ? ?  ? ? ?Past Medical History:  ?Diagnosis Date  ? Aneurysm of left internal carotid artery   ? Small supraclinoid 5 mm  ? Breast cancer ([place])   ? Left s/p lumpectomy (05/06/2018) and adjuvant chemotherapy (06/23/2018) with Adriamycin and Cytoxan x4 followed by Taxol weekly x12  ? DDD (degenerative disc disease), lumbar   ? Essential hypertension   ? Genital warts   ? GERD (gastroesophageal reflux disease)   ? History of anemia   ? History of renal insufficiency   ? Hypertension   ? Insomnia   ? Iron deficiency anemia   ? Ischemic stroke ([place])   ? December 2022  ? Mixed hyperlipidemia   ? Osteoarthritis   ? Parkinson's disease ([place])   ? Peripheral neuropathy   ? Related to chemotherapy  ? ?Past Surgical History:  ?Procedure Laterality Date  ? APPENDECTOMY  1966  ? BIOPSY  08/29/2020  ? Procedure: BIOPSY;  Surgeon: Montez Morita, Quillian Quince, MD;  [facility];  Service: Gastroenterology;;  ? BREAST LUMPECTOMY WITH RADIOACTIVE SEED AND SENTINEL LYMPH NODE BIOPSY Left 05/06/2018  ? Procedure: LEFT BREAST LUMPECTOMY WITH RADIOACTIVE SEED AND LEFT DEEP AXILLARY SENTINEL LYMPH NODE BIOPSY AND BLUE DYE INJECTION;  Surgeon: Fanny Skates, MD;  Location: [place];  Service: General;  Laterality: Left;  ? CATARACT EXTRACTION W/PHACO  Left 01/10/2014  ? Procedure: CATARACT EXTRACTION PHACO AND INTRAOCULAR LENS PLACEMENT ; CDE:  4.94;  Surgeon: Williams Che, MD;  [facility];  Service: Ophthalmology;  Laterality: Left;  ? [place]  ? CHOLECYSTECTOMY    ? COLONOSCOPY WITH PROPOFOL N/A 07/25/2020  ? Procedure: COLONOSCOPY WITH PROPOFOL;  Surgeon: Harvel Quale, MD;  [facility];  Service: Gastroenterology;  Laterality: N/A;  10:55  ? ESOPHAGOGASTRODUODENOSCOPY (EGD) WITH PROPOFOL N/A 08/29/2020  ? Procedure: ESOPHAGOGASTRODUODENOSCOPY (EGD) WITH PROPOFOL;  Surgeon: Harvel Quale, MD;  [facility];  Service: Gastroenterology;  Laterality: N/A;  12:00  ? IR RADIOLOGIST EVAL & MGMT  03/22/2021  ? IR RADIOLOGIST EVAL & MGMT  03/28/2021  ? MYRINGOTOMY WITH TUBE PLACEMENT Right 02/04/2017  ? Procedure: REVISION OF RIGHT MYRINGOTOMY WITH TUBE PLACEMENT, WITH EXAM OF LEFT EAR;  Surgeon: Leta Baptist, MD;  Location: [place];  Service: ENT;  Laterality: Right;  ? NASAL SINUS SURGERY  2016  ? polypectomy  ? POLYPECTOMY  07/25/2020  ? Procedure: POLYPECTOMY;  Surgeon: Harvel Quale, MD;  [facility];  Service: Gastroenterology;;  ? POLYPECTOMY  08/29/2020  ? Procedure: POLYPECTOMY;  Surgeon: Harvel Quale, MD;  [facility];  Service: Gastroenterology;;  ? PORT-A-CATH REMOVAL N/A  01/06/2019  ? Procedure: REMOVAL PORT-A-CATH;  Surgeon: Fanny Skates, MD;  Location: [place];  Service: General;  Laterality: N/A;  ? PORTACATH PLACEMENT Right 06/16/2018  ? Procedure: INSERTION PORT-A-CATH;  Surgeon: Fanny Skates, MD;  Location: [place];  Service: General;  Laterality: Right;  ? SUBMUCOSAL LIFTING INJECTION  08/29/2020  ? Procedure: SUBMUCOSAL LIFTING INJECTION;  Surgeon: Montez Morita, Quillian Quince, MD;  [facility];  Service: Gastroenterology;;  ? TONSILLECTOMY  1970  ? ?Patient Active Problem List  ?  Diagnosis Date Noted  ? History of herpes simplex infection 01/08/2021  ? Papanicolaou smear, as part of routine gynecological examination 01/08/2021  ? CVA (cerebral vascular accident) ([place]) 01/04/2021  ? Acute ischemic stroke ([place]) 01/03/2021  ? Hypokalemia 01/03/2021  ? Cerebral aneurysm 01/03/2021  ? Musculoskeletal pain 01/03/2021  ? Early satiety 11/27/2020  ? Chronic abdominal pain 07/13/2020  ? Port-A-Cath in place 06/23/2018  ? Malignant neoplasm of upper-inner quadrant of left breast in female, estrogen receptor positive ([place]) 04/16/2018  ? Essential hypertension 03/21/2015  ? Hyperlipidemia 03/21/2015  ? Genital herpes 03/21/2015  ? Fecal urgency 03/21/2015  ? GERD (gastroesophageal reflux disease) 03/21/2015  ? ? ?REFERRING DIAG: I63.9 (ICD-10-CM) - Stroke (cerebrum) (HCC) I63.9 (ICD-10-CM) - Cerebrovascular accident (CVA), unspecified mechanism ([place]) ? ?THERAPY DIAG:  ?Muscle weakness (generalized) ? ?Other symptoms and signs involving the nervous system ? ?Unsteadiness on feet ? ?PERTINENT HISTORY: HTN, Lt breast cancer 03/24/2018 , Parkinson  ? ?PRECAUTIONS: Fall ? ?SUBJECTIVE: Patient reports that she is feeling tired this morning, but she is in no pain. Patient reports that she is sometimes off balance at home, but she has not had a fall since the previous visit. ? ?PAIN:  ?Are you having pain? No ? ?OBJECTIVE: ?  ?  ?TODAY'S TREATMENT:  ?04/11/2021 ?-Elliptical x23mn ?-Ambulation with cervical rotation w/ HH target 223f?-Ambulation with cervical flex/ext w/ HH target 22068f-Ambulation with cervical sidebending w/ HH target 220f26fAmbulation with cervical circles w/ HH target 220ft50fh ?-Ambulation with cervical Xs w/ HH target 220ft 25f?04/06/2021 ?-NuStep x5min ?59mrmal ambulation 220ft ?-53flation with cervical rotation w/ distant target 220ft ?-A28fation with cervical flex/ext w/ distant target 220ft ?-Am24ftion with cervical sidebending w/ distant target 220ft ?-Amb60fion with cervical  rotation w/ HH target 220ft ?-Ambu61fon with cervical lex/ext w/ HH target 220ft ?-Ambul20fn with cervical sidebending w/ HH target 220ft ?  ?-SL 40fnce on black foam 2x15s each ?-Normal stance on foam with EC and cervical rotation 1x45s ?-Normal stance on foam with EC and cervical flex/ext 1x45s ?-Normal stance on foam with EC and cervical sidebending 1x45s ?  ?            TESTS AND MEASURES(04/06/2021): ?                         ?FOTO: 74% ?Lt SLS: 7.41s ?30s STS: 13 ?2MWT: 275ft ?  ?  03/72f3 0001  ?Strength  ?Right Hip Extension 4-/5  ?Right Hip ABduction 5/5  ?Left Hip Flexion 4/5  ?Left Hip Extension 3+/5  ?Left Hip ABduction 4+/5  ?  ?  ?PATIENT EDUCATION: ?Education details: on exercise form and function ?Person educated: Patient ?Education method: Explanation and Demonstration ?Education comprehension: verbalized understanding and returned demonstration ?  ?  ?HOME EXERCISE PROGRAM: ?sit to stand, bridge, LT SLR and Lt sidelying abduction; 12/23 - prone hip extension, prone ham curl, sidelying clam, stand heel/toe raise; 12/27:  tandem stance  and sidestep front of counter.; 1/3: quadruped UE then LE; 2/9: GTB row and shoulder extension and begin walking program.  ?  ?  PT Short Term Goals - 03/15/21 1013   ?  ?       ?       ?  PT SHORT TERM GOAL #1  ?  Title PT to be I in HEP to improve LT LE strength to allow LT knee pain to decrease to no greater than 5/10.   ?  Time 3   ?  Period Weeks   ?  Status Achieved   ?       ?  PT SHORT TERM GOAL #2  ?  Title PT to be walking in her home without the use of an assistive device   ?  Time 3   ?  Period Weeks   ?  Status Achieved   ?       ?  PT SHORT TERM GOAL #3  ?  Title PT to be able to single leg stance on her left LE for 20 seconds to reduce risk of falling.   ?  Time 3   ?  Period Weeks   ?  Status Achieved   ?  ?   ?  ?  ?   ?  ?  ?  PT Long Term Goals - 03/15/21 1025   ?  ?       ?       ?  PT LONG TERM GOAL #1  ?  Title PT to be I in an advance HEP to  improve LT knee pain to no greater than 3/10.   ?  Time 6   ?  Period Weeks   ?  Status Achieved   ?  Target Date 02/20/21   ?       ?  PT LONG TERM GOAL #2  ?  Title PT Lt LE strength to be at least 4/5

## 2021-04-12 ENCOUNTER — Telehealth (HOSPITAL_COMMUNITY): Payer: Self-pay | Admitting: Radiology

## 2021-04-12 ENCOUNTER — Other Ambulatory Visit (HOSPITAL_COMMUNITY): Payer: Self-pay | Admitting: Interventional Radiology

## 2021-04-12 DIAGNOSIS — I671 Cerebral aneurysm, nonruptured: Secondary | ICD-10-CM

## 2021-04-12 NOTE — Telephone Encounter (Signed)
Tried to call both patient and her son to schedule brain aneurysm treatment with Dr. Estanislado Pandy. No answer and no voice mail on either phone. Will try again later. JM ?

## 2021-04-13 DIAGNOSIS — I10 Essential (primary) hypertension: Secondary | ICD-10-CM | POA: Diagnosis not present

## 2021-04-13 DIAGNOSIS — E559 Vitamin D deficiency, unspecified: Secondary | ICD-10-CM | POA: Diagnosis not present

## 2021-04-13 DIAGNOSIS — E7849 Other hyperlipidemia: Secondary | ICD-10-CM | POA: Diagnosis not present

## 2021-04-13 DIAGNOSIS — R5383 Other fatigue: Secondary | ICD-10-CM | POA: Diagnosis not present

## 2021-04-13 DIAGNOSIS — D519 Vitamin B12 deficiency anemia, unspecified: Secondary | ICD-10-CM | POA: Diagnosis not present

## 2021-04-13 DIAGNOSIS — K219 Gastro-esophageal reflux disease without esophagitis: Secondary | ICD-10-CM | POA: Diagnosis not present

## 2021-04-13 DIAGNOSIS — E782 Mixed hyperlipidemia: Secondary | ICD-10-CM | POA: Diagnosis not present

## 2021-04-16 ENCOUNTER — Encounter (HOSPITAL_COMMUNITY): Payer: Medicare Other | Admitting: Physical Therapy

## 2021-04-16 DIAGNOSIS — I10 Essential (primary) hypertension: Secondary | ICD-10-CM | POA: Diagnosis not present

## 2021-04-16 DIAGNOSIS — C50912 Malignant neoplasm of unspecified site of left female breast: Secondary | ICD-10-CM | POA: Diagnosis not present

## 2021-04-16 DIAGNOSIS — I6930 Unspecified sequelae of cerebral infarction: Secondary | ICD-10-CM | POA: Diagnosis not present

## 2021-04-16 DIAGNOSIS — G20 Parkinson's disease: Secondary | ICD-10-CM | POA: Diagnosis not present

## 2021-04-16 DIAGNOSIS — Z0001 Encounter for general adult medical examination with abnormal findings: Secondary | ICD-10-CM | POA: Diagnosis not present

## 2021-04-16 DIAGNOSIS — I671 Cerebral aneurysm, nonruptured: Secondary | ICD-10-CM | POA: Diagnosis not present

## 2021-04-16 DIAGNOSIS — D509 Iron deficiency anemia, unspecified: Secondary | ICD-10-CM | POA: Diagnosis not present

## 2021-04-17 ENCOUNTER — Other Ambulatory Visit: Payer: Self-pay | Admitting: *Deleted

## 2021-04-17 DIAGNOSIS — Z79899 Other long term (current) drug therapy: Secondary | ICD-10-CM

## 2021-04-17 DIAGNOSIS — E782 Mixed hyperlipidemia: Secondary | ICD-10-CM

## 2021-04-17 MED ORDER — ROSUVASTATIN CALCIUM 20 MG PO TABS: 20.0000 mg | ORAL_TABLET | Freq: Every day | ORAL | 3 refills | Status: DC

## 2021-04-18 ENCOUNTER — Ambulatory Visit: Payer: Medicare Other | Admitting: Cardiology

## 2021-04-18 ENCOUNTER — Encounter (HOSPITAL_COMMUNITY): Payer: Self-pay | Admitting: Physical Therapy

## 2021-04-18 ENCOUNTER — Other Ambulatory Visit: Payer: Self-pay

## 2021-04-18 ENCOUNTER — Ambulatory Visit (HOSPITAL_COMMUNITY): Payer: Medicare Other | Admitting: Physical Therapy

## 2021-04-18 ENCOUNTER — Telehealth (HOSPITAL_COMMUNITY): Payer: Self-pay | Admitting: Radiology

## 2021-04-18 DIAGNOSIS — M6281 Muscle weakness (generalized): Secondary | ICD-10-CM

## 2021-04-18 DIAGNOSIS — R29818 Other symptoms and signs involving the nervous system: Secondary | ICD-10-CM

## 2021-04-18 DIAGNOSIS — R2681 Unsteadiness on feet: Secondary | ICD-10-CM | POA: Diagnosis not present

## 2021-04-18 NOTE — Therapy (Signed)
?OUTPATIENT PHYSICAL THERAPY DISCHARGE NOTE ? ? ?Patient Name: Kimberly Mccormick ?MRN: [id number] ?DOB:07-08-1950, 71 y.o., female ?Today's Date: 04/18/2021 ? ?PCP: Curlene Labrum, MD ?REFERRING PROVIDER: Curlene Labrum, MD ? ? PT End of Session - 04/18/21 1049   ? ? Visit Number 23   ? Number of Visits 24   ? Date for PT Re-Evaluation 04/14/21   ? Authorization Type [employer] medicare   ? Progress Note Due on Visit 24   PN complete on visit #9  ? PT Start Time 1030   ? PT Stop Time 1108   ? PT Time Calculation (min) 38 min   ? Activity Tolerance Patient tolerated treatment well;Patient limited by fatigue   ? Behavior During Therapy [facility] for tasks assessed/performed   ? ?  ?  ? ?  ?PHYSICAL THERAPY DISCHARGE SUMMARY ? ?Visits from Start of Care: 23 ? ?Current functional level related to goals / functional outcomes: ?See note ?  ?Remaining deficits: ?See note ?  ?Education / Equipment: ?See note  ? ?Patient agrees to discharge. Patient goals were partially met. Patient is being discharged due to being pleased with the current functional level.  ? ?Past Medical History:  ?Diagnosis Date  ? Aneurysm of left internal carotid artery   ? Small supraclinoid 5 mm  ? Breast cancer ([place])   ? Left s/p lumpectomy (05/06/2018) and adjuvant chemotherapy (06/23/2018) with Adriamycin and Cytoxan x4 followed by Taxol weekly x12  ? DDD (degenerative disc disease), lumbar   ? Essential hypertension   ? Genital warts   ? GERD (gastroesophageal reflux disease)   ? History of anemia   ? History of renal insufficiency   ? Hypertension   ? Insomnia   ? Iron deficiency anemia   ? Ischemic stroke ([place])   ? December 2022  ? Mixed hyperlipidemia   ? Osteoarthritis   ? Parkinson's disease ([place])   ? Peripheral neuropathy   ? Related to chemotherapy  ? ?Past Surgical History:  ?Procedure Laterality Date  ? APPENDECTOMY  1966  ? BIOPSY  08/29/2020  ? Procedure: BIOPSY;  Surgeon: Montez Morita, Quillian Quince, MD;  [facility];  Service:  Gastroenterology;;  ? BREAST LUMPECTOMY WITH RADIOACTIVE SEED AND SENTINEL LYMPH NODE BIOPSY Left 05/06/2018  ? Procedure: LEFT BREAST LUMPECTOMY WITH RADIOACTIVE SEED AND LEFT DEEP AXILLARY SENTINEL LYMPH NODE BIOPSY AND BLUE DYE INJECTION;  Surgeon: Fanny Skates, MD;  Location: [place];  Service: General;  Laterality: Left;  ? CATARACT EXTRACTION W/PHACO Left 01/10/2014  ? Procedure: CATARACT EXTRACTION PHACO AND INTRAOCULAR LENS PLACEMENT ; CDE:  4.94;  Surgeon: Williams Che, MD;  [facility];  Service: Ophthalmology;  Laterality: Left;  ? [place]  ? CHOLECYSTECTOMY    ? COLONOSCOPY WITH PROPOFOL N/A 07/25/2020  ? Procedure: COLONOSCOPY WITH PROPOFOL;  Surgeon: Harvel Quale, MD;  [facility];  Service: Gastroenterology;  Laterality: N/A;  10:55  ? ESOPHAGOGASTRODUODENOSCOPY (EGD) WITH PROPOFOL N/A 08/29/2020  ? Procedure: ESOPHAGOGASTRODUODENOSCOPY (EGD) WITH PROPOFOL;  Surgeon: Harvel Quale, MD;  [facility];  Service: Gastroenterology;  Laterality: N/A;  12:00  ? IR RADIOLOGIST EVAL & MGMT  03/22/2021  ? IR RADIOLOGIST EVAL & MGMT  03/28/2021  ? MYRINGOTOMY WITH TUBE PLACEMENT Right 02/04/2017  ? Procedure: REVISION OF RIGHT MYRINGOTOMY WITH TUBE PLACEMENT, WITH EXAM OF LEFT EAR;  Surgeon: Leta Baptist, MD;  Location: [place];  Service: ENT;  Laterality: Right;  ?  NASAL SINUS SURGERY  2016  ? polypectomy  ? POLYPECTOMY  07/25/2020  ? Procedure: POLYPECTOMY;  Surgeon: Harvel Quale, MD;  [facility];  Service: Gastroenterology;;  ? POLYPECTOMY  08/29/2020  ? Procedure: POLYPECTOMY;  Surgeon: Harvel Quale, MD;  [facility];  Service: Gastroenterology;;  ? PORT-A-CATH REMOVAL N/A 01/06/2019  ? Procedure: REMOVAL PORT-A-CATH;  Surgeon: Fanny Skates, MD;  Location: [place];  Service: General;  Laterality: N/A;  ? PORTACATH PLACEMENT Right 06/16/2018  ?  Procedure: INSERTION PORT-A-CATH;  Surgeon: Fanny Skates, MD;  Location: [place];  Service: General;  Laterality: Right;  ? SUBMUCOSAL LIFTING INJECTION  08/29/2020  ? Procedure: SUBMUCOSAL LIFTING INJECTION;  Surgeon: Montez Morita, Quillian Quince, MD;  [facility];  Service: Gastroenterology;;  ? TONSILLECTOMY  1970  ? ?Patient Active Problem List  ? Diagnosis Date Noted  ? History of herpes simplex infection 01/08/2021  ? Papanicolaou smear, as part of routine gynecological examination 01/08/2021  ? CVA (cerebral vascular accident) ([place]) 01/04/2021  ? Acute ischemic stroke ([place]) 01/03/2021  ? Hypokalemia 01/03/2021  ? Cerebral aneurysm 01/03/2021  ? Musculoskeletal pain 01/03/2021  ? Early satiety 11/27/2020  ? Chronic abdominal pain 07/13/2020  ? Port-A-Cath in place 06/23/2018  ? Malignant neoplasm of upper-inner quadrant of left breast in female, estrogen receptor positive ([place]) 04/16/2018  ? Essential hypertension 03/21/2015  ? Hyperlipidemia 03/21/2015  ? Genital herpes 03/21/2015  ? Fecal urgency 03/21/2015  ? GERD (gastroesophageal reflux disease) 03/21/2015  ? ? ?REFERRING DIAG: I63.9 (ICD-10-CM) - Stroke (cerebrum) (HCC) I63.9 (ICD-10-CM) - Cerebrovascular accident (CVA), unspecified mechanism ([place]) ? ?THERAPY DIAG:  ?Muscle weakness (generalized) ? ?Other symptoms and signs involving the nervous system ? ?Unsteadiness on feet ? ?PERTINENT HISTORY: HTN, Lt breast cancer 03/24/2018 , Parkinson ? ?PRECAUTIONS: Fall ? ?SUBJECTIVE: Patient states that she knows she has improved in function and that walking related tasks have become easier since starting PT.  ? ?PAIN:  ?Are you having pain? No ? ? ?OBJECTIVE: ?  ?  ?TODAY'S TREATMENT:  ?04/18/2021: ?Aerobic: ?-NuStep x80min ? ?04/11/2021 ?-Elliptical x18min ?-Ambulation with cervical rotation w/ HH target 22ft ?-Ambulation with cervical flex/ext w/ HH target 273ft ?-Ambulation with cervical sidebending w/ HH target 215ft ?-Ambulation  with cervical circles w/ HH target 237ft each ?-Ambulation with cervical Xs w/ HH target 27ft ?  ?  ?04/06/2021 ?-NuStep x60min ?-Normal ambulation 279ft ?-Ambulation with cervical rotation w/ distant target 235ft ?-Ambulation with cervical flex/ext w/ distant target 248ft ?-Ambulation with cervical sidebending w/ distant target 271ft ?-Ambulation with cervical rotation w/ HH target 218ft ?-Ambulation with cervical lex/ext w/ HH target 262ft ?-Ambulation with cervical sidebending w/ HH target 221ft ?  ?-SL balance on black foam 2x15s each ?-Normal stance on foam with EC and cervical rotation 1x45s ?-Normal stance on foam with EC and cervical flex/ext 1x45s ?-Normal stance on foam with EC and cervical sidebending 1x45s ?  ?            TESTS AND MEASURES(04/18/2021): ?                         ?FOTO: 75% ?Lt SLS: 29.8s ?Rt SLS: >30s ?30s STS: 13 ?2MWT: 444ft ?  ?  04/18/21 0001  ?Strength  ?Right Hip Flexion 4+/5  ?Right Hip Extension 4/5  ?Right Hip ABduction 5/5  ?Left Hip Flexion 4+/5  ?Left Hip Extension 4-/5  ?Left Hip ABduction 5/5  ?  ?  ?  PATIENT EDUCATION: ?Education details: Weight gain and diet ?Person educated: Patient ?Education method: Explanation and Demonstration ?Education comprehension: verbalized understanding and returned demonstration ?  ?  ?HOME EXERCISE PROGRAM: ?sit to stand, bridge, LT SLR and Lt sidelying abduction; 12/23 - prone hip extension, prone ham curl, sidelying clam, stand heel/toe raise; 12/27:  tandem stance and sidestep front of counter.; 1/3: quadruped UE then LE; 2/9: GTB row and shoulder extension and begin walking program.  ?  ?  PT Short Term Goals - 03/15/21 1013   ?  ?       ?       ?  PT SHORT TERM GOAL #1  ?  Title PT to be I in HEP to improve LT LE strength to allow LT knee pain to decrease to no greater than 5/10.   ?  Time 3   ?  Period Weeks   ?  Status Achieved   ?       ?  PT SHORT TERM GOAL #2  ?  Title PT to be walking in her home without the use of an assistive  device   ?  Time 3   ?  Period Weeks   ?  Status Achieved   ?       ?  PT SHORT TERM GOAL #3  ?  Title PT to be able to single leg stance on her left LE for 20 seconds to reduce risk of falling.   ?  Time 3   ?  Period Week

## 2021-04-18 NOTE — Telephone Encounter (Signed)
Called pt, left message with her son,Chris for her to call me back to schedule aneurysm tx wit Deveshwar. JM ?

## 2021-04-20 ENCOUNTER — Telehealth (HOSPITAL_COMMUNITY): Payer: Self-pay | Admitting: Radiology

## 2021-04-20 ENCOUNTER — Other Ambulatory Visit (HOSPITAL_COMMUNITY): Payer: Self-pay

## 2021-04-20 ENCOUNTER — Telehealth: Payer: Self-pay | Admitting: Student

## 2021-04-20 NOTE — Progress Notes (Signed)
error 

## 2021-04-20 NOTE — Telephone Encounter (Signed)
Returned pt's call. Brilinta '90mg'$  1 BID was called in to [facility] in [place] with 6 refills per Deveshwar. JM ?

## 2021-04-24 ENCOUNTER — Other Ambulatory Visit: Payer: Self-pay | Admitting: Student

## 2021-04-24 ENCOUNTER — Encounter (HOSPITAL_COMMUNITY): Payer: Self-pay | Admitting: Interventional Radiology

## 2021-04-24 ENCOUNTER — Other Ambulatory Visit: Payer: Self-pay

## 2021-04-24 NOTE — H&P (Deleted)
  The note originally documented on this encounter has been moved the the encounter in which it belongs.  

## 2021-04-24 NOTE — Progress Notes (Signed)
PCP - Dr. Pleas Koch  ?Cardiologist - Dr. Domenic Polite ?EKG - 01/04/21 ?Chest x-ray -  ?ECHO - 01/04/21 ?Cardiac Cath -  ?CPAP -  ? ?Aspirin Instructions: per pt son Harrell Gave, pt was instructed to take ASA DOS ?ERAS Protcol -  ?COVID TEST- n/a ? ?Anesthesia review: n/a ? ?------------- ? ?SDW INSTRUCTIONS: ? ?Your procedure is scheduled on Wednesday 4/5. Please report to [facility] Main Entrance "A" at 06:30 A.M., and check in at the Admitting office. Call this number if you have problems the morning of surgery: [phone] ? ? ?Remember: Do not eat or drink after midnight the night before your surgery ?  ?Medications to take morning of surgery with a sip of water include: ?acyclovir (ZOVIRAX)  ?ALPRAZolam Duanne Moron) if needed ?cetirizine (ZYRTEC) if needed ?HYDROcodone-acetaminophen (NORCO/VICODIN) if needed ?metoprolol succinate (TOPROL-XL)  ?omeprazole (PRILOSEC) ?pramipexole (MIRAPEX)  ?rosuvastatin (CRESTOR)  ? ?Follow your surgeon's instructions on when to stop Aspirin.  If no instructions were given by your surgeon then you will need to call the office to get those instructions.   ? ?As of today, STOP taking any Aleve, Naproxen, Ibuprofen, Motrin, Advil, Goody's, BC's, all herbal medications, fish oil, and all vitamins. ? ?  ?The Morning of Surgery ?Do not wear jewelry, make-up or nail polish. ?Do not wear lotions, powders, or perfumes, or deodorant ?Do not bring valuables to the hospital. ?[employer] is not responsible for any belongings or valuables. ? ?If you are a smoker, DO NOT Smoke 24 hours prior to surgery ? ?If you wear a CPAP at night please bring your mask the morning of surgery  ? ?Remember that you must have someone to transport you home after your surgery, and remain with you for 24 hours if you are discharged the same day. ? ?Please bring cases for contacts, glasses, hearing aids, dentures or bridgework because it cannot be worn into surgery.  ? ?Patients discharged the day of surgery will not be  allowed to drive home.  ? ?Please shower the NIGHT BEFORE/MORNING OF SURGERY (use antibacterial soap like DIAL soap if possible). Wear comfortable clothes the morning of surgery. Oral Hygiene is also important to reduce your risk of infection.  Remember - BRUSH YOUR TEETH THE MORNING OF SURGERY WITH YOUR REGULAR TOOTHPASTE ? ?Patient denies shortness of breath, fever, cough and chest pain.  ? ? ?   ? ?

## 2021-04-24 NOTE — Progress Notes (Signed)
Anesthesia Chart Review: ?Same day workup ? ?Patient was hospitalized in December 2022 with acute ischemic stroke involving the right corona radiata by brain MRI.  She had no cardiac arrhythmias documented by telemetry, LVEF was normal range by echocardiography, and she had no significant extracranial carotid artery disease.  She was also found to have a 5 mm aneurysm of the supraclinoid left ICA.  She was treated with aspirin and statin therapy.  Subsequent outpatient 30-day cardiac monitor did not show any atrial fibrillation.  Seen by cardiologist Dr. Domenic Polite 04/06/21. Per note, "Patient with 5 mm aneurysm of the supraclinoid left ICA.  She is being considered for IR repair by Dr. Estanislado Pandy.  This would be a relatively low risk from a cardiac perspective.  LVEF is normal by her most recent echocardiogram, she has had no documented atrial fibrillation by cardiac monitoring, and is currently not on anticoagulant therapy.  She should be able to proceed without further cardiac testing." ? ?Follows with neurologist Dr. Rexene Alberts for Parkinson's disease.  She referred patient to Dr. Estanislado Pandy for consideration of IR repair of the small supraclinoid aneurysm of the left ICA. ? ?Patient will need day of surgery labs and evaluation. ? ?EKG 01/03/2021: Sinus rhythm with frequent Premature ventricular complexes.  Rate 80. Minimal voltage criteria for LVH, may be normal variant ( R in aVL ). ST & T wave abnormality, consider lateral ischemia ? ?CTA head neck 03/26/2021: ?IMPRESSION: ?1. Negative for large vessel occlusion. No atherosclerosis or ?stenosis in the neck. ?  ?2. Positive for a 5-6 mm unruptured aneurysm of the distal Left ICA, ?supraclinoid segment with broad aneurysm neck. Neuro Endovascular ?(Neuro-Interventional Radiology, Endovascular Neurosurgery) ?consultation is suggested to evaluate the appropriateness of ?potential treatment. ?  ?3. Evidence of Intracranial Atherosclerosis, affecting the left MCA ?M1 and right  PCA P2 segments with mild stenosis. ?  ?4. Cerebral chronic small vessel disease with a stable CT appearance ?since the December MRI. No new intracranial abnormality. ? ?Event monitor 02/16/2021: ?[employer] monitor reviewed.  30 days analyzed.  Predominant rhythm is sinus with heart rate ranging from 45 bpm up to 132 bpm and average heart rate 71 bpm. ?  ?There were overall rare PVCs,, intermittently more frequent and with ventricular bigeminy.  A few brief bursts of NSVT were noted, 4-5 beats.  No sustained ventricular events. ?  ?There were no significant atrial arrhythmias, importantly no atrial fibrillation documented. ? ?TTE 01/04/2021: ? 1. Left ventricular ejection fraction, by estimation, is 55 to 60%. The  ?left ventricle has normal function. The left ventricle has no regional  ?wall motion abnormalities. There is moderate asymmetric left ventricular  ?hypertrophy of the basal segment.  ?Left ventricular diastolic parameters are consistent with Grade I  ?diastolic dysfunction (impaired relaxation).  ? 2. Right ventricular systolic function is normal. The right ventricular  ?size is normal. There is normal pulmonary artery systolic pressure. The  ?estimated right ventricular systolic pressure is 76.2 mmHg.  ? 3. The mitral valve is grossly normal. Trivial mitral valve  ?regurgitation.  ? 4. The aortic valve is tricuspid. Aortic valve regurgitation is not  ?visualized. Aortic valve mean gradient measures 4.0 mmHg.  ? 5. The inferior vena cava is normal in size with greater than 50%  ?respiratory variability, suggesting right atrial pressure of 3 mmHg.  ? ?Carotid ultrasound 01/04/2021:  ?RIGHT CAROTID ARTERY: No significant atheromatous plaque. ?  ?RIGHT VERTEBRAL ARTERY:  Antegrade flow. ?  ?LEFT CAROTID ARTERY:  No significant atheromatous plaque. ?  ?  LEFT VERTEBRAL ARTERY:  Antegrade flow. ?  ?IMPRESSION: ?No significant stenosis of internal carotid arteries. ? ?Nuclear stress 01/29/2019: ?Impression: ?1.   No reversible ischemia or infarction. ?2.  Normal left ventricular wall motion. ?3.  Left ventricular ejection fraction 58% (previously 62%). ?4.  Noninvasive restratification: Low. ? ? ? ?Karoline Caldwell, PA-C ?[facility] Short Stay Center/Anesthesiology ?Phone [phone] ?04/24/2021 3:28 PM ? ?

## 2021-04-24 NOTE — H&P (Signed)
? ?Chief Complaint: ?Patient was seen in consultation today for cerebral angiogram with intervention for supraclinoid left ICA aneurysm at the request of Dr. Star Age  ? ?Referring Physician(s): Dr. Rexene Alberts, Eunice Blase  ? ?Supervising Physician: Luanne Bras ? ?Patient Status: [facility] - Out-pt ? ?History of Present Illness: ?Kimberly Mccormick is a 71 y.o. female with PMHs of hypertension, GERD, breast cancer, hyperlipidemia, CVA in December 2022, and recent diagnosis of supraclinoid left ICA aneurysm who is known to Dr. Estanislado Pandy.  ? ?She was referred to Dr. Estanislado Pandy by her neurologist and had a consultation visit on 03/22/21. Treatment options for the supraclinoid left ICA aneurysm was discussed in detail during the consultation, patient and her son stated that they will discuss about the procedure and will inform Dr. Estanislado Pandy their final decision.  ? ?Patient unfortunately presented to ED with severe headache associated with left-sided upper neck pain, without any other associated neurological symptoms of ?photophobia, loss of awareness, speech difficulties, paresthesias of the limbs, or weakness of the extremities, or of nausea or vomiting., CTA head and neck showed:  ? ?1. Negative for large vessel occlusion. No atherosclerosis or ?stenosis in the neck. ?  ?2. Positive for a 5-6 mm unruptured aneurysm of the distal Left ICA, ?supraclinoid segment with broad aneurysm neck. Neuro Endovascular ?(Neuro-Interventional Radiology, Endovascular Neurosurgery) ?consultation is suggested to evaluate the appropriateness of ?potential treatment. ?  ?3. Evidence of Intracranial Atherosclerosis, affecting the left MCA ?M1 and right PCA P2 segments with mild stenosis. ?  ?4. Cerebral chronic small vessel disease with a stable CT appearance ?since the December MRI. No new intracranial abnormality. ? ?Patient was seen by Dr. Estanislado Pandy again on 03/28/21, and after thorough discussion and shared decision making, patient decided to  proceed with cerebral angiogram with intervention.  ? ?Patient presents to [facility] today for the procedure.  ? ?Patient laying in bed, not in acute distress.  ?Reports no new neurological symptoms such as HA, weakness, double vision since 03/26/21.  ?Denise headache, fever, chills, shortness of breath, cough, chest pain, abdominal pain, nausea ,vomiting, and bleeding. ? ? ?Past Medical History:  ?Diagnosis Date  ? Aneurysm of left internal carotid artery   ? Small supraclinoid 5 mm  ? Breast cancer ([place])   ? Left s/p lumpectomy (05/06/2018) and adjuvant chemotherapy (06/23/2018) with Adriamycin and Cytoxan x4 followed by Taxol weekly x12  ? DDD (degenerative disc disease), lumbar   ? Essential hypertension   ? Genital warts   ? GERD (gastroesophageal reflux disease)   ? History of anemia   ? History of renal insufficiency   ? Hypertension   ? Insomnia   ? Iron deficiency anemia   ? Ischemic stroke ([employer])   ? December 2022  ? Mixed hyperlipidemia   ? Osteoarthritis   ? Parkinson's disease ([place])   ? Peripheral neuropathy   ? Related to chemotherapy  ? ? ?Past Surgical History:  ?Procedure Laterality Date  ? APPENDECTOMY  1966  ? BIOPSY  08/29/2020  ? Procedure: BIOPSY;  Surgeon: Montez Morita, Quillian Quince, MD;  [facility];  Service: Gastroenterology;;  ? BREAST LUMPECTOMY WITH RADIOACTIVE SEED AND SENTINEL LYMPH NODE BIOPSY Left 05/06/2018  ? Procedure: LEFT BREAST LUMPECTOMY WITH RADIOACTIVE SEED AND LEFT DEEP AXILLARY SENTINEL LYMPH NODE BIOPSY AND BLUE DYE INJECTION;  Surgeon: Fanny Skates, MD;  Location: [place];  Service: General;  Laterality: Left;  ? CATARACT EXTRACTION W/PHACO Left 01/10/2014  ? Procedure: CATARACT EXTRACTION PHACO AND INTRAOCULAR LENS  PLACEMENT ; CDE:  4.94;  Surgeon: Williams Che, MD;  Location: [facility];  Service: Ophthalmology;  Laterality: Left;  ? [place]  ? CHOLECYSTECTOMY    ? COLONOSCOPY WITH PROPOFOL N/A 07/25/2020  ? Procedure: COLONOSCOPY WITH  PROPOFOL;  Surgeon: Harvel Quale, MD;  [facility];  Service: Gastroenterology;  Laterality: N/A;  10:55  ? ESOPHAGOGASTRODUODENOSCOPY (EGD) WITH PROPOFOL N/A 08/29/2020  ? Procedure: ESOPHAGOGASTRODUODENOSCOPY (EGD) WITH PROPOFOL;  Surgeon: Harvel Quale, MD;  [facility];  Service: Gastroenterology;  Laterality: N/A;  12:00  ? IR RADIOLOGIST EVAL & MGMT  03/22/2021  ? IR RADIOLOGIST EVAL & MGMT  03/28/2021  ? MYRINGOTOMY WITH TUBE PLACEMENT Right 02/04/2017  ? Procedure: REVISION OF RIGHT MYRINGOTOMY WITH TUBE PLACEMENT, WITH EXAM OF LEFT EAR;  Surgeon: Leta Baptist, MD;  Location: [place];  Service: ENT;  Laterality: Right;  ? NASAL SINUS SURGERY  2016  ? polypectomy  ? POLYPECTOMY  07/25/2020  ? Procedure: POLYPECTOMY;  Surgeon: Harvel Quale, MD;  [facility];  Service: Gastroenterology;;  ? POLYPECTOMY  08/29/2020  ? Procedure: POLYPECTOMY;  Surgeon: Harvel Quale, MD;  [facility];  Service: Gastroenterology;;  ? PORT-A-CATH REMOVAL N/A 01/06/2019  ? Procedure: REMOVAL PORT-A-CATH;  Surgeon: Fanny Skates, MD;  Location: [place];  Service: General;  Laterality: N/A;  ? PORTACATH PLACEMENT Right 06/16/2018  ? Procedure: INSERTION PORT-A-CATH;  Surgeon: Fanny Skates, MD;  Location: [place];  Service: General;  Laterality: Right;  ? SUBMUCOSAL LIFTING INJECTION  08/29/2020  ? Procedure: SUBMUCOSAL LIFTING INJECTION;  Surgeon: Montez Morita, Quillian Quince, MD;  [facility];  Service: Gastroenterology;;  ? TONSILLECTOMY  1970  ? ? ?Allergies: ?Eggs or egg-derived products, Other, and Penicillins ? ?Medications: ?Prior to Admission medications   ?Medication Sig Start Date End Date Taking? Authorizing Provider  ?acyclovir (ZOVIRAX) 400 MG tablet Take 400 mg by mouth 2 (two) times daily.     [provider]  ?ALPRAZolam Duanne Moron) 0.5 MG tablet Take 0.5 mg by mouth  daily as needed for anxiety. 01/12/21   [provider]  ?aspirin EC 81 MG EC tablet Take 1 tablet (81 mg total) by mouth daily. Swallow whole. 01/05/21   Geradine Girt, DO  ?cetirizine (ZYRTEC) 10 MG tablet Take 10 mg by mouth daily as needed for allergies.    [provider]  ?Cholecalciferol (VITAMIN D) 2000 UNITS tablet Take 2,000 Units by mouth daily.    [provider]  ?diclofenac Sodium (VOLTAREN) 1 % GEL Apply 2 g topically 4 (four) times daily. ?Patient taking differently: Apply 2 g topically daily as needed (pain). 01/05/21   Geradine Girt, DO  ?ferrous sulfate 325 (65 FE) MG tablet Take 325 mg by mouth daily with breakfast.    [provider]  ?HYDROcodone-acetaminophen (NORCO/VICODIN) 5-325 MG tablet Take 1 tablet by mouth every 6 (six) hours as needed for severe pain. 02/04/21   Ripley Fraise, MD  ?metoprolol succinate (TOPROL-XL) 25 MG 24 hr tablet Take 25 mg by mouth daily. 05/28/17   [provider]  ?omeprazole (PRILOSEC) 40 MG capsule Take 1 capsule (40 mg total) by mouth daily. ?Patient taking differently: Take 40 mg by mouth daily as needed (Acid reflux). 11/27/20   Harvel Quale, MD  ?pramipexole (MIRAPEX) 0.5 MG tablet Take 1 tablet (0.5 mg total) by mouth 3 (three) times daily. 12/07/20   Star Age,  MD  ?PROCTO-MED HC 2.5 % rectal cream Place 1 application. rectally 2 (two) times daily as needed for hemorrhoids. 07/19/20   [provider]  ?rosuvastatin (CRESTOR) 20 MG tablet Take 1 tablet (20 mg total) by mouth daily. 04/17/21   Satira Sark, MD  ?zolpidem (AMBIEN) 10 MG tablet Take 5 mg by mouth at bedtime as needed for sleep.    [provider]  ?  ? ?Family History  ?Problem Relation Age of Onset  ? Lupus Mother   ? Heart attack Father   ?     age 93  ? Prostate cancer Father   ? Hypothyroidism Sister   ? Breast cancer Sister   ? Hypertension Sister   ? Breast cancer Sister   ? Hypertension Brother   ?  Parkinson's disease Paternal Aunt   ? Stroke Neg Hx   ? ? ?Social History  ? ?Socioeconomic History  ? Marital status: Legally Separated  ?  Spouse name: Not on file  ? Number of children: 2  ? Years of ed

## 2021-04-24 NOTE — Anesthesia Preprocedure Evaluation (Addendum)
Anesthesia Evaluation  ?Patient identified by MRN, date of birth, ID band ?Patient awake ? ? ? ?Reviewed: ?Allergy & Precautions, NPO status , Patient's Chart, lab work & pertinent test results, reviewed documented beta blocker date and time  ? ?Airway ?Mallampati: III ? ?TM Distance: >3 FB ?Neck ROM: Full ? ? ? Dental ? ?(+) Edentulous Upper, Edentulous Lower, Dental Advisory Given ?  ?Pulmonary ?neg pulmonary ROS,  ?  ?Pulmonary exam normal ?breath sounds clear to auscultation ? ? ? ? ? ? Cardiovascular ?hypertension, Pt. on home beta blockers and Pt. on medications ?Normal cardiovascular exam ?Rhythm:Regular Rate:Normal ? ?Event monitor 02/16/2021: ?Preventice monitor reviewed. ?30 days analyzed. ?Predominant rhythm is sinus with heart rate ranging from 45 bpm up to 132 bpm and average heart rate 71 bpm. ?? ?There were overall rare PVCs,, intermittently more frequent and with ventricular bigeminy. ?A few brief bursts of NSVT were noted, 4-5 beats. ?No sustained ventricular events. ?? ?There were no significant atrial arrhythmias, importantly no atrial fibrillation documented. ?? ?TTE 01/04/2021: ??1. Left ventricular ejection fraction, by estimation, is 55 to 60%. The  ?left ventricle has normal function. The left ventricle has no regional  ?wall motion abnormalities. There is moderate asymmetric left ventricular  ?hypertrophy of the basal segment.  ?Left ventricular diastolic parameters are consistent with Grade I  ?diastolic dysfunction (impaired relaxation).  ??2. Right ventricular systolic function is normal. The right ventricular  ?size is normal. There is normal pulmonary artery systolic pressure. The  ?estimated right ventricular systolic pressure is 47.6 mmHg.  ??3. The mitral valve is grossly normal. Trivial mitral valve  ?regurgitation.  ??4. The aortic valve is tricuspid. Aortic valve regurgitation is not  ?visualized. Aortic valve mean gradient measures 4.0 mmHg.  ??5.  The inferior vena cava is normal in size with greater than 50%  ?respiratory variability, suggesting right atrial pressure of 3 mmHg ?  ?Neuro/Psych ?PSYCHIATRIC DISORDERS Anxiety  Neuromuscular disease (Parkinsons) CVA, No Residual Symptoms   ? GI/Hepatic ?Neg liver ROS, GERD  ,  ?Endo/Other  ?negative endocrine ROS ? Renal/GU ?Renal InsufficiencyRenal disease  ?negative genitourinary ?  ?Musculoskeletal ? ?(+) Arthritis ,  ? Abdominal ?  ?Peds ? 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 Reproductive/Obstetrics ? ?  ? ? ? ? ? ? ? ? ? ? ? ? ? ?  ?  ? ? ? ? ? ?Anesthesia Physical ?Anesthesia Plan ? ?ASA: 3 ? ?Anesthesia Plan: General  ? ?Post-op Pain Management: Tylenol PO (pre-op)* and Minimal or no pain anticipated  ? ?Induction: Intravenous ? ?PONV Risk Score and Plan: 3 and Dexamethasone, Ondansetron and Treatment may vary due to age or medical condition ? ?Airway Management Planned: Oral ETT ? ?Additional Equipment: Arterial line ? ?Intra-op Plan:  ? ?Post-operative Plan: Extubation in OR ? ?Informed Consent: I have reviewed the patients History and Physical, chart, labs and discussed the procedure including the risks, benefits and alternatives for the proposed anesthesia with the patient or authorized representative who has indicated his/her understanding and acceptance.  ? ? ? ?Dental advisory given ? ?Plan Discussed with: CRNA ? ?Anesthesia Plan Comments:   ? ? ? ? ? ?Anesthesia Quick Evaluation ? ?

## 2021-04-25 ENCOUNTER — Encounter (HOSPITAL_COMMUNITY): Payer: Self-pay

## 2021-04-25 ENCOUNTER — Other Ambulatory Visit: Payer: Self-pay

## 2021-04-25 ENCOUNTER — Encounter (HOSPITAL_COMMUNITY)
Admission: RE | Disposition: A | Discharge status: Home / Self Care | Payer: Self-pay | Source: Home / Self Care | Attending: Interventional Radiology

## 2021-04-25 ENCOUNTER — Inpatient Hospital Stay (HOSPITAL_COMMUNITY)
Admission: RE | Admit: 2021-04-25 | Discharge: 2021-04-27 | DRG: 026 | Disposition: A | Discharge status: Home / Self Care | Payer: Medicare Other | Attending: Interventional Radiology | Admitting: Interventional Radiology

## 2021-04-25 ENCOUNTER — Inpatient Hospital Stay (HOSPITAL_COMMUNITY)
Admission: RE | Admit: 2021-04-25 | Discharge: 2021-04-25 | Disposition: A | Discharge status: Home / Self Care | Payer: Medicare Other | Source: Ambulatory Visit | Attending: Interventional Radiology | Admitting: Interventional Radiology

## 2021-04-25 ENCOUNTER — Inpatient Hospital Stay (HOSPITAL_COMMUNITY): Payer: Medicare Other | Admitting: Physician Assistant

## 2021-04-25 ENCOUNTER — Encounter (HOSPITAL_COMMUNITY): Payer: Self-pay | Admitting: Interventional Radiology

## 2021-04-25 DIAGNOSIS — Z8673 Personal history of transient ischemic attack (TIA), and cerebral infarction without residual deficits: Secondary | ICD-10-CM | POA: Diagnosis not present

## 2021-04-25 DIAGNOSIS — Z853 Personal history of malignant neoplasm of breast: Secondary | ICD-10-CM

## 2021-04-25 DIAGNOSIS — M5136 Other intervertebral disc degeneration, lumbar region: Secondary | ICD-10-CM | POA: Diagnosis present

## 2021-04-25 DIAGNOSIS — D509 Iron deficiency anemia, unspecified: Secondary | ICD-10-CM | POA: Diagnosis not present

## 2021-04-25 DIAGNOSIS — E782 Mixed hyperlipidemia: Secondary | ICD-10-CM | POA: Diagnosis not present

## 2021-04-25 DIAGNOSIS — I671 Cerebral aneurysm, nonruptured: Secondary | ICD-10-CM

## 2021-04-25 DIAGNOSIS — Z86010 Personal history of colonic polyps: Secondary | ICD-10-CM | POA: Diagnosis not present

## 2021-04-25 DIAGNOSIS — Z9221 Personal history of antineoplastic chemotherapy: Secondary | ICD-10-CM | POA: Diagnosis not present

## 2021-04-25 DIAGNOSIS — Z79899 Other long term (current) drug therapy: Secondary | ICD-10-CM | POA: Diagnosis not present

## 2021-04-25 DIAGNOSIS — Z8249 Family history of ischemic heart disease and other diseases of the circulatory system: Secondary | ICD-10-CM | POA: Diagnosis not present

## 2021-04-25 DIAGNOSIS — F419 Anxiety disorder, unspecified: Secondary | ICD-10-CM

## 2021-04-25 DIAGNOSIS — K219 Gastro-esophageal reflux disease without esophagitis: Secondary | ICD-10-CM | POA: Diagnosis present

## 2021-04-25 DIAGNOSIS — I672 Cerebral atherosclerosis: Secondary | ICD-10-CM | POA: Diagnosis present

## 2021-04-25 DIAGNOSIS — Y848 Other medical procedures as the cause of abnormal reaction of the patient, or of later complication, without mention of misadventure at the time of the procedure: Secondary | ICD-10-CM | POA: Diagnosis not present

## 2021-04-25 DIAGNOSIS — Z7902 Long term (current) use of antithrombotics/antiplatelets: Secondary | ICD-10-CM

## 2021-04-25 DIAGNOSIS — Z635 Disruption of family by separation and divorce: Secondary | ICD-10-CM | POA: Diagnosis not present

## 2021-04-25 DIAGNOSIS — Z832 Family history of diseases of the blood and blood-forming organs and certain disorders involving the immune mechanism: Secondary | ICD-10-CM

## 2021-04-25 DIAGNOSIS — L7632 Postprocedural hematoma of skin and subcutaneous tissue following other procedure: Secondary | ICD-10-CM | POA: Diagnosis not present

## 2021-04-25 DIAGNOSIS — Z8042 Family history of malignant neoplasm of prostate: Secondary | ICD-10-CM | POA: Diagnosis not present

## 2021-04-25 DIAGNOSIS — I10 Essential (primary) hypertension: Secondary | ICD-10-CM | POA: Diagnosis not present

## 2021-04-25 DIAGNOSIS — G20 Parkinson's disease: Secondary | ICD-10-CM | POA: Diagnosis present

## 2021-04-25 DIAGNOSIS — Z7982 Long term (current) use of aspirin: Secondary | ICD-10-CM

## 2021-04-25 DIAGNOSIS — Z820 Family history of epilepsy and other diseases of the nervous system: Secondary | ICD-10-CM | POA: Diagnosis not present

## 2021-04-25 DIAGNOSIS — Z880 Allergy status to penicillin: Secondary | ICD-10-CM

## 2021-04-25 DIAGNOSIS — I720 Aneurysm of carotid artery: Secondary | ICD-10-CM | POA: Diagnosis not present

## 2021-04-25 HISTORY — PX: IR ANGIO INTRA EXTRACRAN SEL COM CAROTID INNOMINATE UNI R MOD SED: IMG5359

## 2021-04-25 HISTORY — PX: RADIOLOGY WITH ANESTHESIA: SHX6223

## 2021-04-25 HISTORY — PX: IR ANGIO INTRA EXTRACRAN SEL INTERNAL CAROTID UNI L MOD SED: IMG5361

## 2021-04-25 HISTORY — PX: IR CT HEAD LTD: IMG2386

## 2021-04-25 HISTORY — PX: IR US GUIDE VASC ACCESS RIGHT: IMG2390

## 2021-04-25 HISTORY — PX: IR NEURO EACH ADD'L AFTER BASIC UNI LEFT (MS): IMG5373

## 2021-04-25 HISTORY — PX: IR ANGIOGRAM FOLLOW UP STUDY: IMG697

## 2021-04-25 HISTORY — PX: IR 3D INDEPENDENT WKST: IMG2385

## 2021-04-25 HISTORY — PX: IR TRANSCATH/EMBOLIZ: IMG695

## 2021-04-25 LAB — CBC
HCT: 35.4 % — ABNORMAL LOW (ref 36.0–46.0)
Hemoglobin: 11.8 g/dL — ABNORMAL LOW (ref 12.0–15.0)
MCH: 27.3 pg (ref 26.0–34.0)
MCHC: 33.3 g/dL (ref 30.0–36.0)
MCV: 81.9 fL (ref 80.0–100.0)
Platelets: 209 10*3/uL (ref 150–400)
RBC: 4.32 MIL/uL (ref 3.87–5.11)
RDW: 12.8 % (ref 11.5–15.5)
WBC: 8.2 10*3/uL (ref 4.0–10.5)
nRBC: 0 % (ref 0.0–0.2)

## 2021-04-25 LAB — CBC WITH DIFFERENTIAL/PLATELET
Abs Immature Granulocytes: 0.02 10*3/uL (ref 0.00–0.07)
Basophils Absolute: 0 10*3/uL (ref 0.0–0.1)
Basophils Relative: 1 %
Eosinophils Absolute: 0.3 10*3/uL (ref 0.0–0.5)
Eosinophils Relative: 5 %
HCT: 34.9 % — ABNORMAL LOW (ref 36.0–46.0)
Hemoglobin: 11.6 g/dL — ABNORMAL LOW (ref 12.0–15.0)
Immature Granulocytes: 0 %
Lymphocytes Relative: 37 %
Lymphs Abs: 2.4 10*3/uL (ref 0.7–4.0)
MCH: 27.7 pg (ref 26.0–34.0)
MCHC: 33.2 g/dL (ref 30.0–36.0)
MCV: 83.3 fL (ref 80.0–100.0)
Monocytes Absolute: 0.5 10*3/uL (ref 0.1–1.0)
Monocytes Relative: 7 %
Neutro Abs: 3.3 10*3/uL (ref 1.7–7.7)
Neutrophils Relative %: 50 %
Platelets: 201 10*3/uL (ref 150–400)
RBC: 4.19 MIL/uL (ref 3.87–5.11)
RDW: 13 % (ref 11.5–15.5)
WBC: 6.6 10*3/uL (ref 4.0–10.5)
nRBC: 0 % (ref 0.0–0.2)

## 2021-04-25 LAB — POCT ACTIVATED CLOTTING TIME
Activated Clotting Time: 239 seconds
Activated Clotting Time: 287 seconds
Activated Clotting Time: 323 seconds
Activated Clotting Time: 179 seconds

## 2021-04-25 LAB — GLUCOSE, CAPILLARY
Glucose-Capillary: 128 mg/dL — ABNORMAL HIGH (ref 70–99)
Glucose-Capillary: 152 mg/dL — ABNORMAL HIGH (ref 70–99)

## 2021-04-25 LAB — PROTIME-INR
INR: 1.1 (ref 0.8–1.2)
Prothrombin Time: 13.8 seconds (ref 11.4–15.2)

## 2021-04-25 LAB — BASIC METABOLIC PANEL
Anion gap: 7 (ref 5–15)
BUN: 18 mg/dL (ref 8–23)
CO2: 24 mmol/L (ref 22–32)
Calcium: 9.1 mg/dL (ref 8.9–10.3)
Chloride: 108 mmol/L (ref 98–111)
Creatinine, Ser: 1.01 mg/dL — ABNORMAL HIGH (ref 0.44–1.00)
GFR, Estimated: 59 mL/min — ABNORMAL LOW (ref >60)
Glucose, Bld: 98 mg/dL (ref 70–99)
Potassium: 3.6 mmol/L (ref 3.5–5.1)
Sodium: 139 mmol/L (ref 135–145)

## 2021-04-25 LAB — MRSA NEXT GEN BY PCR, NASAL: MRSA by PCR Next Gen: NOT DETECTED

## 2021-04-25 LAB — HEPARIN LEVEL (UNFRACTIONATED): Heparin Unfractionated: >1.1 IU/mL — ABNORMAL HIGH (ref 0.30–0.70)

## 2021-04-25 IMAGING — US IR TRANSCATH EMBOLIZATION
1 of 2 series · 9 of 24 positions shown · non-contrast
Comparison: CT angiogram of the head and neck [DATE].

CLINICAL DATA: History of new onset headaches. Discovery of a left
internal carotid artery paraclinoid aneurysm.

EXAM:
TRANSCATHETER THERAPY EMBOLIZATION
TECHNIQUE: Informed written consent was obtained from the patient after a
thorough discussion of the procedural risks, benefits and
alternatives. All questions were addressed. Maximal Sterile Barrier
Technique was utilized including caps, mask, sterile gowns, sterile
gloves, sterile drape, hand hygiene and skin antiseptic. A timeout
was performed prior to the initiation of the procedure.

[Series 300: dr. (person_name) · 9 of 437 slices shown]
[im 1/437]
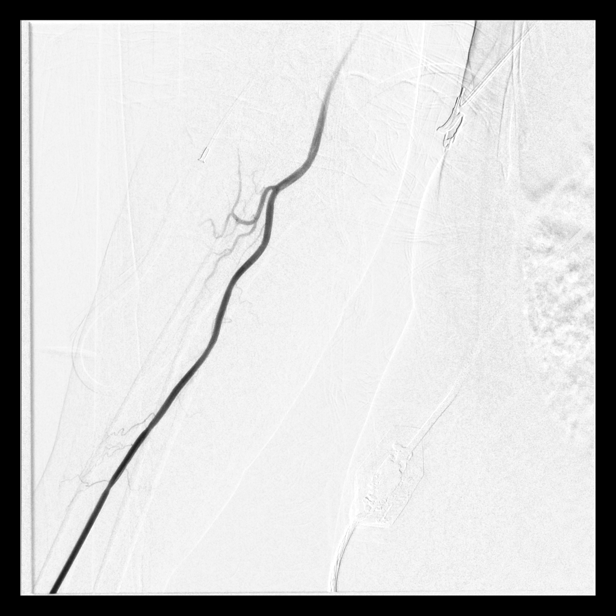
[im 60/437]
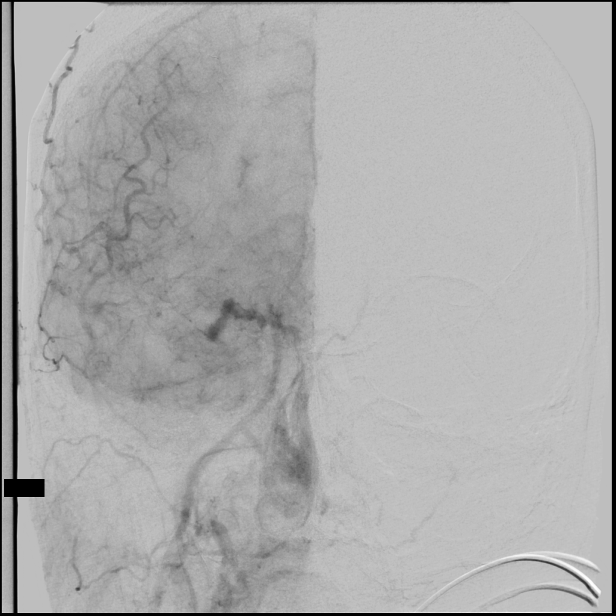
[im 100/437]
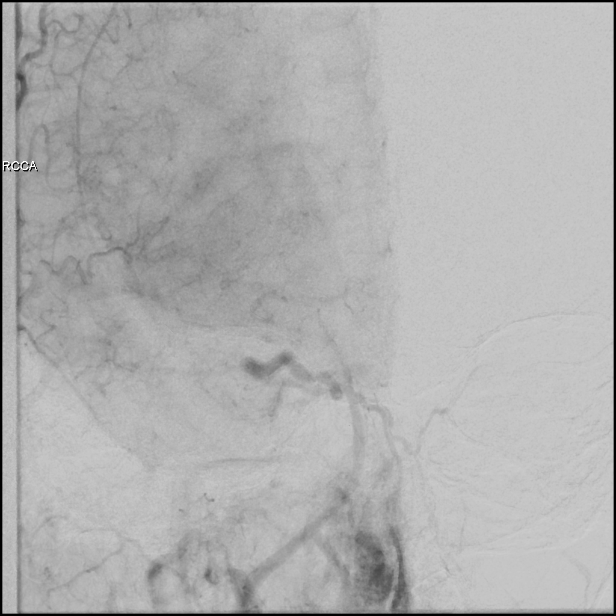
[im 159/437]
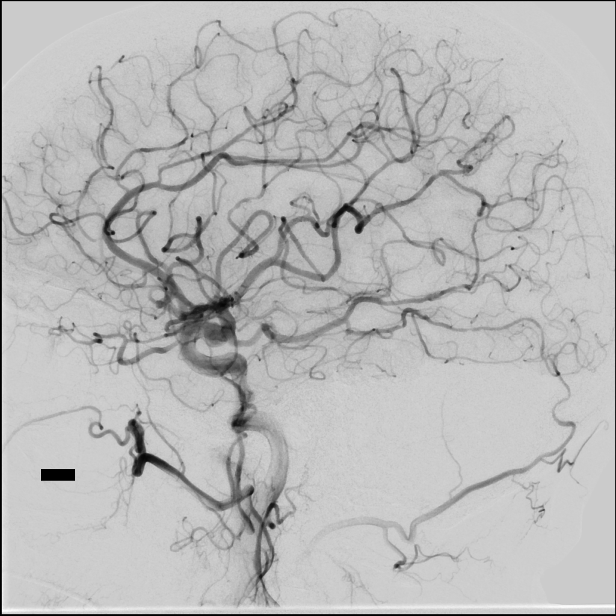
[im 218/437]
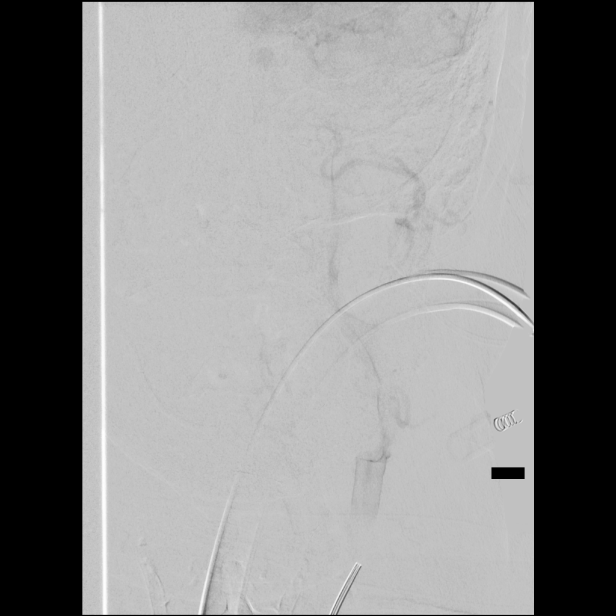
[im 258/437]
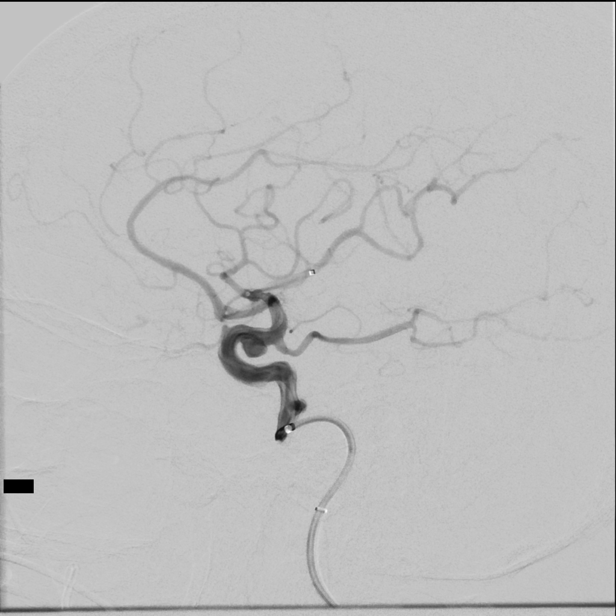
[im 318/437]
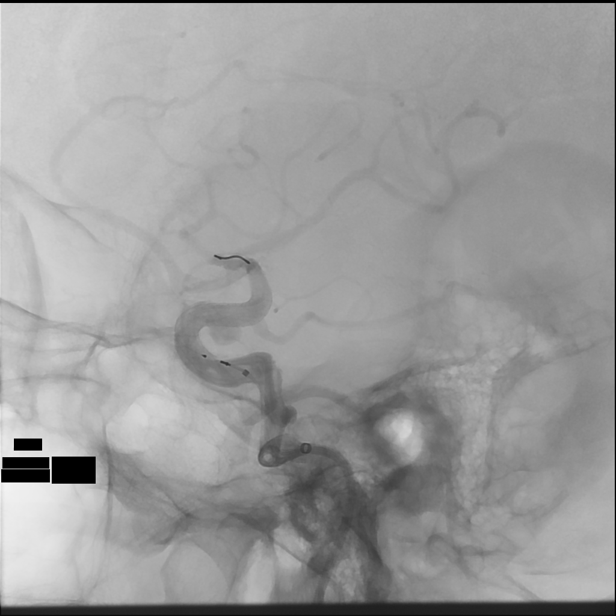
[im 377/437]
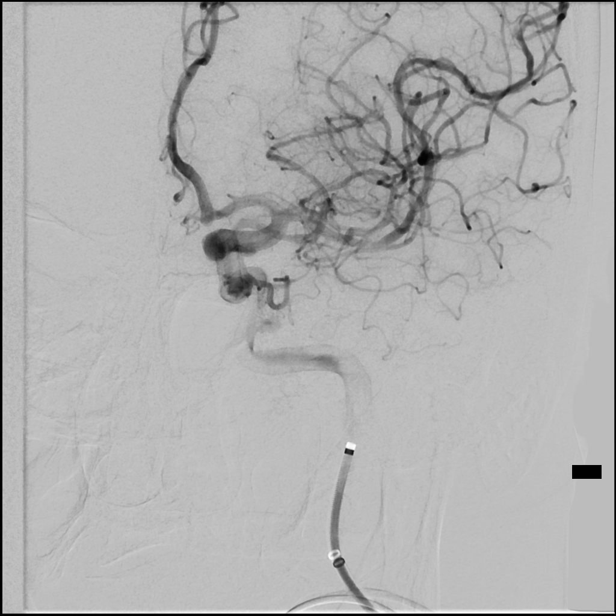
[im 417/437]
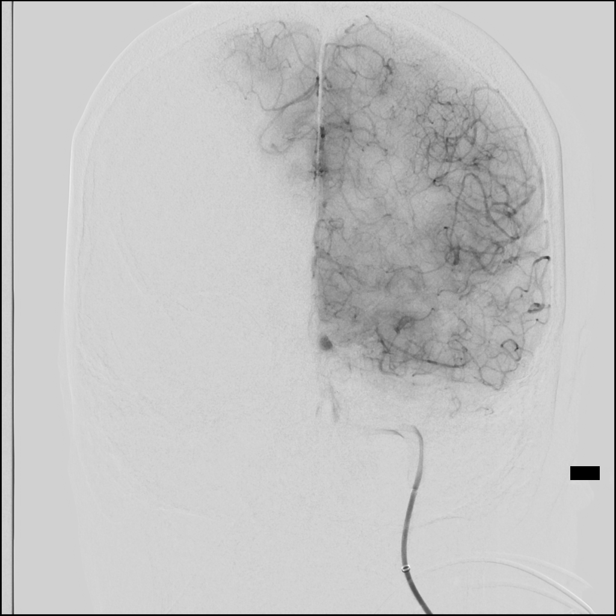

[9 of 24 positions shown; findings below may reference images not displayed]

MEDICATIONS:
Heparin 3,500 units IV. Vancomycin 1 g IV antibiotic was
administered within 1 hour of the procedure.

ANESTHESIA/SEDATION:
General anesthesia.

CONTRAST:  Omnipaque 300 approximately 100 mL.

FLUOROSCOPY TIME:  Fluoroscopy Time: 55 minutes 36 seconds ([VW]
mGy).

COMPLICATIONS:
None immediate.
The right forearm to the wrist was prepped and draped in the usual
sterile manner. Using ultrasound the right radial artery was
identified and its morphology documented in the radiology PACS
system. A dorsal palmar anastomosis was verified to be present.
Using ultrasound guidance and a micropuncture set, access into the
right radial artery was obtained. A [DATE] French radial sheath was
then inserted.

The micro guidewire, and obturator were removed. Good aspiration
obtained from the side port of the radial sheath. A cocktail of [VW]
units of heparin, 2.5 mg of verapamil and 200 units of nitroglycerin
was then infused in diluted form through the stent without
difficulty. A right radial arteriogram was then performed.

Over a 0.035 inch Roadrunner guidewire, MOOLMAN 2 diagnostic
catheter was advanced to the aortic arch region, and selectively
positioned in the right common carotid artery and the left common
carotid artery.

A 3D rotational arteriogram was performed of the left anterior
intracranial circulation via the left common carotid artery
injection. 3D reformations were then obtained on a separate
workstation.
FINDINGS: Right common arteriogram demonstrates the right external carotid
artery and its major branches to be widely patent.

The right internal carotid artery at the bulb demonstrates a smooth
shallow plaque along the posterior wall without evidence of
ulcerations or of intraluminal filling defects. The vessel is seen
to ascend to the cranial skull base. The petrous, the cavernous and
the supraclinoid segments demonstrate wide patency. A right
posterior communicating artery is seen opacifying the posterior
cerebral artery distribution. The right middle cerebral artery and
the right anterior cerebral artery opacify into the capillary and
venous phases. Scattered mild to moderate areas of focal narrowing
of the right anterior and the right middle cerebral artery
distribution probably signify intracranial arteriosclerosis.

Left common carotid arteriogram demonstrates the left external
carotid artery and its major branches to be widely patent.

The left internal carotid artery at the bulb to the cranial skull
base is widely patent.

The petrous, the cavernous and the supraclinoid segments demonstrate
wide patency.

Left posterior communicating artery is seen opacifying the left
posterior cerebral distribution.

The left middle cerebral artery in the proximal M1 segment
demonstrates approximately 50% stenosis. The trifurcation branches
demonstrate moderate narrowing of the inferior division of the left
middle cerebral artery.

The left anterior cerebral artery opacifies into the capillary and
venous phases. Arising in the paraclinoid region of the left
internal carotid artery is a wide neck saccular aneurysm measuring
approximately 5.5 mm x 5.3 mm. Also arising from the left internal
carotid artery. In the distal petrous segment is a 3.7 mm x 3.3 mm
laterally projecting saccular aneurysm.

ENDOVASCULAR TREATMENT OF THE WIDE NECK LEFT INTERNAL CAROTID ARTERY
PARACLINOID ANEURYSM USING THE PIPELINE SHIELD FLOW DIVERTER DEVICE

Over a 0.035 inch Rosen exchange guidewire, a combination of a 95 cm
7 French [REDACTED] sheath inside of which was a 6 MOOLMAN 2
support catheter was advanced and positioned in the distal cervical
left ICA.

The support catheter, and the guidewire were removed. Good
aspiration obtained from the hub of the 7 French [REDACTED] guide
catheter. Gentle control arteriogram demonstrated no evidence of
spasms, dissections or of intraluminal filling defects. Measurements
were then performed of the distal and the proximal landing zone of
the flow diverter device in the left internal carotid artery just
distal to the left anterior choroidal artery, and proximally the
ipsilateral ophthalmic artery.

A 4.75 x 14 pipeline shield flow diverter device was selected for
treatment.

A combination of an 027 150 cm Phenom microcatheter inside of a 5
French 115 cm Navien guide catheter was advanced over a 014 inch
Aristotle micro guidewire with a moderate J configuration to the
caval cavernous left ICA. Using a torque device, the micro guidewire
was then manipulated passed the aneurysm into the inferior division
of the left middle cerebral artery M2 M3 region followed by the
microcatheter. The guidewire was removed. Good aspiration obtained
from the hub of the microcatheter. A gentle control arteriogram
performed through the microcatheter demonstrated safe positioning of
the tip of the microcatheter.

The entire system was then straightened to release the tension. The
4.75 x 14 pipeline shield flow diverter device was then advanced to
the distal end of the microcatheter.

The O ring on the delivery microcatheter was loosened. With slight
forward gentle traction with the right hand on the delivery micro
guidewire, with the left hand the distal portion of the device was
then unsheathed to a cigar configuration. With further maneuvering,
the distal portion of the device was noted to be opened in the mid
M1 segment.

The combination was retrieved to the supraclinoid left ICA. From
there, using advancement of the micro guidewire, and fluffing
technique, the pipeline diverter was then deployed from distal to
proximal position.

Following fluid deployment, a control arteriogram performed through
Navien guide catheter in the left internal carotid artery
demonstrated excellent apposition without evidence of an endoleak.
Already stasis was noted within the aneurysm itself.

Under constant fluoroscopic guidance, the micro guidewire, and the
microcatheter were retrieved and removed without any change in the
movement of the device.

Control arteriograms were then performed at 10 and 30 minutes post
deployment of the device. These continued to demonstrate excellent
apposition with partial stasis within the left internal carotid
artery paraclinoid aneurysm.

Final control arteriogram performed through the 5 French Navien
guide catheter in the left ICA demonstrated excellent apposition
with stasis and without evidence of endoleak at the site of the
device.

More distally, no evidence of intraluminal filling defects or of
occlusions was evident.

The combination of the [REDACTED] sheath, and Navien guide were retrieved
and removed. A wrist band was then applied at the radial puncture
site. The distal radial pulse was verified to be present. Patient's
general anesthesia was reversed, and patient was extubated. Upon
recovery, the patient reported no evidence of headaches, nausea,
vomiting or visual aberrations. Her speech remained normal. She was
able to move all fours equally. She was then transferred to the PACU
and then neuro ICU. Later in the day, she was more awake, alert and
appropriate.

The wrist band at the right radial puncture site remained intact. No
evidence of hematoma was evident at the site of the wrist band.

Later in the evening, at about [DATE] p.m., I was asked to assess the
patient in view of developing right hand swelling and pain extending
to the radial puncture site.

Upon my examination, the right hand was seen to be significantly
swollen on both the palmar and dorsal surfaces and also extending
mildly into the fingers of the hand. Also noted was a palpable
firmness just proximal to the radial puncture site. The radial pulse
proximal and distal to the puncture site were dopplerable, as was
the right ulnar pulse. The right hand appeared slightly cooler inits
entirety. The patient was able to squeeze with the right hand
limited slightly due to the tightness due to the swelling. She
denied any paresthesias or weakness or constant significant pain.

Her IV heparin was stopped. Her arm was elevated after holding
pressure for about 15 minutes over the right radial puncture site
alternating with the ulnar artery compression. The patient had no
further pain thereafter. The following morning, the right hand and
wrist swelling was noted be appreciably decreased. Hand appeared
cooler than the contralateral left hand. Her radial and ulnar pulses
were intact. Patient reported mild decrease and tenderness overlying
the dorsal aspect of the right hand.

Consult was then initiated with Dr. MOOLMAN surgeon, to
evaluate the patient.

Neurologically, the patient remained stable with no new headaches,
nausea or vomiting. The patient was able to tolerate solids and
liquids without any difficulty.

Patient was then sat up in a chair with a sling to keep her right
arm upright.
IMPRESSION: Status post endovascular treatment of a right paraclinoid aneurysm
with a 4.75 x 14 pipeline shield flow diverter device with stasis.

PLAN:
Follow-up in the clinic 2 weeks post discharge.

## 2021-04-25 SURGERY — IR WITH ANESTHESIA
Anesthesia: General

## 2021-04-25 MED ORDER — FERROUS SULFATE 325 (65 FE) MG PO TABS
325.0000 mg | ORAL_TABLET | Freq: Every day | ORAL | Status: DC
  Administered 2021-04-26 – 2021-04-27 (×2): 325 mg via ORAL
  Filled 2021-04-25 (×2): qty 1

## 2021-04-25 MED ORDER — IOHEXOL 300 MG/ML  SOLN
100.0000 mL | Freq: Once | INTRAMUSCULAR | Status: AC | PRN
  Administered 2021-04-25: 40 mL via INTRA_ARTERIAL

## 2021-04-25 MED ORDER — PROTAMINE SULFATE 10 MG/ML IV SOLN
INTRAVENOUS | Status: DC | PRN
  Administered 2021-04-25: 5 mg via INTRAVENOUS

## 2021-04-25 MED ORDER — CHLORHEXIDINE GLUCONATE CLOTH 2 % EX PADS
6.0000 | MEDICATED_PAD | Freq: Every day | CUTANEOUS | Status: DC
  Administered 2021-04-25 – 2021-04-26 (×2): 6 via TOPICAL

## 2021-04-25 MED ORDER — CLEVIDIPINE BUTYRATE 0.5 MG/ML IV EMUL
0.0000 mg/h | INTRAVENOUS | Status: AC
  Administered 2021-04-25: 2 mg/h via INTRAVENOUS
  Administered 2021-04-25: 4 mg/h via INTRAVENOUS
  Administered 2021-04-25: 1 mg/h via INTRAVENOUS
  Filled 2021-04-25 (×2): qty 50

## 2021-04-25 MED ORDER — TICAGRELOR 90 MG PO TABS
90.0000 mg | ORAL_TABLET | Freq: Two times a day (BID) | ORAL | Status: DC
Start: 2021-04-25 — End: 2021-04-27

## 2021-04-25 MED ORDER — ASPIRIN 81 MG PO CHEW
81.0000 mg | CHEWABLE_TABLET | Freq: Every day | ORAL | Status: DC
  Administered 2021-04-26 – 2021-04-27 (×2): 81 mg via ORAL
  Filled 2021-04-25 (×2): qty 1

## 2021-04-25 MED ORDER — LIDOCAINE HCL 1 % IJ SOLN
INTRAMUSCULAR | Status: AC
  Filled 2021-04-25: qty 20

## 2021-04-25 MED ORDER — CHLORHEXIDINE GLUCONATE 0.12 % MT SOLN
15.0000 mL | Freq: Once | OROMUCOSAL | Status: AC
  Administered 2021-04-25: 15 mL via OROMUCOSAL
  Filled 2021-04-25: qty 15

## 2021-04-25 MED ORDER — ACYCLOVIR 400 MG PO TABS
400.0000 mg | ORAL_TABLET | Freq: Two times a day (BID) | ORAL | Status: DC
  Administered 2021-04-25 – 2021-04-27 (×4): 400 mg via ORAL
  Filled 2021-04-25 (×4): qty 1

## 2021-04-25 MED ORDER — VANCOMYCIN HCL IN DEXTROSE 1-5 GM/200ML-% IV SOLN
INTRAVENOUS | Status: AC
  Filled 2021-04-25: qty 200

## 2021-04-25 MED ORDER — DEXAMETHASONE SODIUM PHOSPHATE 10 MG/ML IJ SOLN
INTRAMUSCULAR | Status: DC | PRN
  Administered 2021-04-25: 5 mg via INTRAVENOUS

## 2021-04-25 MED ORDER — LIDOCAINE 2% (20 MG/ML) 5 ML SYRINGE
INTRAMUSCULAR | Status: DC | PRN
  Administered 2021-04-25: 50 mg via INTRAVENOUS

## 2021-04-25 MED ORDER — HYDROCODONE-ACETAMINOPHEN 5-325 MG PO TABS
1.0000 | ORAL_TABLET | Freq: Four times a day (QID) | ORAL | Status: DC | PRN
  Filled 2021-04-25: qty 1

## 2021-04-25 MED ORDER — VERAPAMIL HCL 2.5 MG/ML IV SOLN: INTRA_ARTERIAL | Status: AC | PRN

## 2021-04-25 MED ORDER — ACETAMINOPHEN 500 MG PO TABS
1000.0000 mg | ORAL_TABLET | Freq: Once | ORAL | Status: AC
  Administered 2021-04-25: 1000 mg via ORAL
  Filled 2021-04-25: qty 2

## 2021-04-25 MED ORDER — PRAMIPEXOLE DIHYDROCHLORIDE 0.25 MG PO TABS
0.5000 mg | ORAL_TABLET | Freq: Three times a day (TID) | ORAL | Status: DC
  Administered 2021-04-25 – 2021-04-27 (×5): 0.5 mg via ORAL
  Filled 2021-04-25 (×5): qty 2

## 2021-04-25 MED ORDER — SODIUM CHLORIDE 0.9 % IV SOLN
INTRAVENOUS | Status: DC
Start: 2021-04-25 — End: 2021-04-27

## 2021-04-25 MED ORDER — HEPARIN SODIUM (PORCINE) 1000 UNIT/ML IJ SOLN
INTRAMUSCULAR | Status: AC
  Filled 2021-04-25: qty 10

## 2021-04-25 MED ORDER — HEPARIN (PORCINE) 25000 UT/250ML-% IV SOLN
500.0000 [IU]/h | INTRAVENOUS | Status: DC
  Administered 2021-04-25: 500 [IU]/h via INTRAVENOUS

## 2021-04-25 MED ORDER — PHENYLEPHRINE HCL-NACL 20-0.9 MG/250ML-% IV SOLN
INTRAVENOUS | Status: DC | PRN
  Administered 2021-04-25: 20 ug/min via INTRAVENOUS

## 2021-04-25 MED ORDER — CLEVIDIPINE BUTYRATE 0.5 MG/ML IV EMUL
INTRAVENOUS | Status: DC | PRN
  Administered 2021-04-25: 1 mg/h via INTRAVENOUS

## 2021-04-25 MED ORDER — HEPARIN (PORCINE) 25000 UT/250ML-% IV SOLN
500.0000 [IU]/h | INTRAVENOUS | Status: DC
  Filled 2021-04-25: qty 250

## 2021-04-25 MED ORDER — ACETAMINOPHEN 325 MG PO TABS
650.0000 mg | ORAL_TABLET | ORAL | Status: DC | PRN
  Administered 2021-04-25 – 2021-04-26 (×2): 650 mg via ORAL
  Filled 2021-04-25 (×2): qty 2

## 2021-04-25 MED ORDER — TICAGRELOR 90 MG PO TABS
90.0000 mg | ORAL_TABLET | Freq: Two times a day (BID) | ORAL | Status: DC
  Administered 2021-04-25 – 2021-04-27 (×4): 90 mg via ORAL
  Filled 2021-04-25 (×4): qty 1

## 2021-04-25 MED ORDER — SODIUM CHLORIDE 0.9 % IV SOLN: INTRAVENOUS | Status: DC

## 2021-04-25 MED ORDER — ACETAMINOPHEN 650 MG RE SUPP: 650.0000 mg | RECTAL | Status: DC | PRN

## 2021-04-25 MED ORDER — FENTANYL CITRATE (PF) 100 MCG/2ML IJ SOLN
25.0000 ug | INTRAMUSCULAR | Status: DC | PRN
  Administered 2021-04-25: 25 ug via INTRAVENOUS

## 2021-04-25 MED ORDER — ALPRAZOLAM 0.5 MG PO TABS: 0.5000 mg | ORAL_TABLET | Freq: Every day | ORAL | Status: DC | PRN

## 2021-04-25 MED ORDER — SUGAMMADEX SODIUM 200 MG/2ML IV SOLN
INTRAVENOUS | Status: DC | PRN
  Administered 2021-04-25: 150 mg via INTRAVENOUS

## 2021-04-25 MED ORDER — HEPARIN SODIUM (PORCINE) 1000 UNIT/ML IJ SOLN
INTRAMUSCULAR | Status: DC | PRN
  Administered 2021-04-25: 1000 [IU] via INTRAVENOUS
  Administered 2021-04-25: 500 [IU] via INTRAVENOUS

## 2021-04-25 MED ORDER — FENTANYL CITRATE (PF) 100 MCG/2ML IJ SOLN
INTRAMUSCULAR | Status: AC
  Filled 2021-04-25: qty 2

## 2021-04-25 MED ORDER — HEPARIN (PORCINE) 25000 UT/250ML-% IV SOLN
INTRAVENOUS | Status: AC
  Filled 2021-04-25: qty 250

## 2021-04-25 MED ORDER — ZOLPIDEM TARTRATE 5 MG PO TABS
5.0000 mg | ORAL_TABLET | Freq: Every evening | ORAL | Status: DC | PRN
  Administered 2021-04-27: 5 mg via ORAL
  Filled 2021-04-25: qty 1

## 2021-04-25 MED ORDER — ASPIRIN 81 MG PO CHEW: 81.0000 mg | CHEWABLE_TABLET | Freq: Every day | ORAL | Status: DC

## 2021-04-25 MED ORDER — EPTIFIBATIDE 20 MG/10ML IV SOLN
INTRAVENOUS | Status: AC
  Filled 2021-04-25: qty 10

## 2021-04-25 MED ORDER — VITAMIN D 25 MCG (1000 UNIT) PO TABS
2000.0000 [IU] | ORAL_TABLET | Freq: Every day | ORAL | Status: DC
  Administered 2021-04-25 – 2021-04-27 (×3): 2000 [IU] via ORAL
  Filled 2021-04-25 (×3): qty 2

## 2021-04-25 MED ORDER — ONDANSETRON HCL 4 MG/2ML IJ SOLN
INTRAMUSCULAR | Status: DC | PRN
  Administered 2021-04-25: 4 mg via INTRAVENOUS

## 2021-04-25 MED ORDER — VERAPAMIL HCL 2.5 MG/ML IV SOLN
INTRAVENOUS | Status: AC
  Filled 2021-04-25: qty 2

## 2021-04-25 MED ORDER — VANCOMYCIN HCL 1000 MG IV SOLR
INTRAVENOUS | Status: DC | PRN
  Administered 2021-04-25: 1000 mg via INTRAVENOUS

## 2021-04-25 MED ORDER — PHENYLEPHRINE 40 MCG/ML (10ML) SYRINGE FOR IV PUSH (FOR BLOOD PRESSURE SUPPORT)
PREFILLED_SYRINGE | INTRAVENOUS | Status: DC | PRN
  Administered 2021-04-25: 120 ug via INTRAVENOUS

## 2021-04-25 MED ORDER — LACTATED RINGERS IV SOLN: INTRAVENOUS | Status: DC | PRN

## 2021-04-25 MED ORDER — ORAL CARE MOUTH RINSE: 15.0000 mL | Freq: Once | OROMUCOSAL | Status: AC

## 2021-04-25 MED ORDER — FENTANYL CITRATE (PF) 250 MCG/5ML IJ SOLN
INTRAMUSCULAR | Status: DC | PRN
  Administered 2021-04-25 (×2): 50 ug via INTRAVENOUS

## 2021-04-25 MED ORDER — NITROGLYCERIN 1 MG/10 ML FOR IR/CATH LAB
INTRA_ARTERIAL | Status: AC
  Filled 2021-04-25: qty 10

## 2021-04-25 MED ORDER — NITROGLYCERIN 1 MG/10 ML FOR IR/CATH LAB
INTRA_ARTERIAL | Status: AC | PRN
  Administered 2021-04-25: 200 ug via INTRA_ARTERIAL

## 2021-04-25 MED ORDER — CLEVIDIPINE BUTYRATE 0.5 MG/ML IV EMUL
INTRAVENOUS | Status: AC
  Filled 2021-04-25: qty 50

## 2021-04-25 MED ORDER — HYDROCODONE-ACETAMINOPHEN 5-325 MG PO TABS
1.0000 | ORAL_TABLET | Freq: Once | ORAL | Status: AC
  Administered 2021-04-25: 1 via ORAL

## 2021-04-25 MED ORDER — PROPOFOL 10 MG/ML IV BOLUS
INTRAVENOUS | Status: DC | PRN
  Administered 2021-04-25: 100 mg via INTRAVENOUS
  Administered 2021-04-25: 20 mg via INTRAVENOUS

## 2021-04-25 MED ORDER — ACETAMINOPHEN 160 MG/5ML PO SOLN: 650.0000 mg | ORAL | Status: DC | PRN

## 2021-04-25 MED ORDER — ROCURONIUM BROMIDE 10 MG/ML (PF) SYRINGE
PREFILLED_SYRINGE | INTRAVENOUS | Status: DC | PRN
  Administered 2021-04-25: 20 mg via INTRAVENOUS
  Administered 2021-04-25: 60 mg via INTRAVENOUS
  Administered 2021-04-25: 20 mg via INTRAVENOUS

## 2021-04-25 MED ORDER — ROSUVASTATIN CALCIUM 20 MG PO TABS
20.0000 mg | ORAL_TABLET | Freq: Every day | ORAL | Status: DC
  Administered 2021-04-26 – 2021-04-27 (×2): 20 mg via ORAL
  Filled 2021-04-25 (×2): qty 1

## 2021-04-25 MED ORDER — EPTIFIBATIDE 20 MG/10ML IV SOLN
INTRAVENOUS | Status: AC | PRN
  Administered 2021-04-25 (×2): 1.5 mg via INTRA_ARTERIAL

## 2021-04-25 NOTE — Progress Notes (Signed)
Paged Deveshwar regarding swelling in pt's R arm. R hand is cold to touch. R radial pulse can be heard by doppler. RN marked pt's arm, and elevated arm with pillows. Deveshwar ordered to elevate the arm, stop the Heparin drip, and get an ACT.  ?

## 2021-04-25 NOTE — Progress Notes (Signed)
Brief update note: ? ?Ms. Artist Pais evaluated in PACU.  Lying in recumbenent position in stretcher.  CNII-XII grossly intact.  Moves all extremities appropriately and with symmetric strength.  No acute distress.  TR band in place with 7cc air.   Right upper distal extremity cooler than left.  1cc air removed from TR band and pulse was easily identified with doppler.  Awaiting ICU bed ? ?Electronically Signed: ?Pasty Spillers, PA ?04/25/2021, 3:18 PM ? ? ?

## 2021-04-25 NOTE — Progress Notes (Signed)
Patient ID: Kimberly Mccormick, female   DOB: 1950-06-14, 71 y.o.   MRN: [id number] ?INR. ?Informed by patient's nurse of worsening right hand and right wrist swelling associated with significant pain especially over the dorsal in the palmar aspect of the hand. ?Patient also noted to have a moderate-sized firm swelling proximal to the area radial artery puncture site. ? ?On examination. ?Apart from the pain as described, the patient reports no paresthesias, muscle weakness, ?Patient reports pain mostly i in the hand region and less so  proximal to the radial puncture site. ? ?Vital signs. ? ?Blood pressure in the 120s to the 70s. ?Heart rate 80s sinus rhythm.  Good oxygen saturation on room air. ?Prominent swelling of the hand over the hypothenar thenar regions, and the dorsal aspect of the hand. ?Swollen hand very tender to palpation and to lesser degree over the lower forearm ?Well-defined firm tender area noted at both the radial puncture site probably representing a hematoma. ? ?Radial pulse dopplerable both the radial puncture site, and below the radial puncture site.  The right ulnar pulse is also dopplerable.  RT hand feels slightly cooler compared to the left.  Patient able to squeeze with equal power in both hands though feels tight on the right side because of the swelling. ?Manual compression held above the radial puncture site and intermittently over the ulnar artery for 15 minutes. ? ?Plan. ? ?Stop IV heparin.  Raise the patient's forearm.  Hydrocodone for pain relief x1. ?No blood pressure cuff or needle puncture in the right arm for 24 hours.  Watch for radial and ulnar pulses closely with Doppler and for worsening pain. ? ?Arlean Hopping MD ?

## 2021-04-25 NOTE — Sedation Documentation (Signed)
ACT 323 resulted reported to Dr Estanislado Pandy  ?

## 2021-04-25 NOTE — Sedation Documentation (Signed)
ACT result [id number] reported to Dr Estanislado Pandy ?

## 2021-04-25 NOTE — Progress Notes (Signed)
Pt arrived to unit with 2 bags of belongings including: ? ?Clothing ?2 cell phones ?Hearing aids ?Hearing Runner, broadcasting/film/video ?Glasses ?Dentures ? ?

## 2021-04-25 NOTE — Anesthesia Procedure Notes (Signed)
Arterial Line Insertion ?Start/End4/05/2021 8:25 AM, 04/25/2021 8:33 AM ?Performed by: Freddrick March, MD, Dorthea Cove, CRNA, CRNA ? Patient location: Pre-op. ?Preanesthetic checklist: patient identified, IV checked, site marked, risks and benefits discussed, surgical consent, monitors and equipment checked, pre-op evaluation, timeout performed and anesthesia consent ?Lidocaine 1% used for infiltration ?Left, radial was placed ?Catheter size: 20 G ?Hand hygiene performed  and maximum sterile barriers used  ? ?Attempts: 1 ?Procedure performed without using ultrasound guided technique. ?Following insertion, dressing applied. ?Post procedure assessment: normal and unchanged ? ?Patient tolerated the procedure well with no immediate complications. ? ? ?

## 2021-04-25 NOTE — Progress Notes (Signed)
Received call for swelling of the hand. ?Evaluation of patient reveals right hand cooler than left.  Unable to locate distal pulse with doppler.  1cc air removed from TR band and pulse was successfully identified.  Assisted RN in elevating arm position in hopes of decreasing swelling and potential for bleeding.  Questions of patient and son at bedside were addressed.  Will continue to follow. ? ?Overnight concerns can be addressed to Dr. Estanislado Pandy via pager: [phone] ? ?Electronically Signed: ?Pasty Spillers, PA ?04/25/2021, 4:39 PM ? ? ?

## 2021-04-25 NOTE — Progress Notes (Signed)
ANTICOAGULATION CONSULT NOTE - Initial Consult ? ?Pharmacy Consult for heparin infusion  ?Indication:  post-interventional neuroradiology procedure ? ?Allergies  ?Allergen Reactions  ? Eggs Or Egg-Derived Products Nausea And Vomiting  ? Other Nausea And Vomiting  ?  Dairy products\ ?  ? Penicillins Hives  ?  Has patient had a PCN reaction causing immediate rash, facial/tongue/throat swelling, SOB or lightheadedness with hypotension: No ?Has patient had a PCN reaction causing severe rash involving mucus membranes or skin necrosis: No ?Has patient had a PCN reaction that required hospitalization: No ?Has patient had a PCN reaction occurring within the last 10 years: No ?If all of the above answers are "NO", then may proceed with Cephalosporin use. ?  ? ? ?Patient Measurements: ?Height: '5\' 3"'$  (160 cm) ?Weight: 59 kg (130 lb) ?IBW/kg (Calculated) : 52.4 ?Heparin Dosing Weight: 59 kg ? ?Vital Signs: ?Temp: 97.6 ?F (36.4 ?C) (04/05 1226) ?Temp Source: Oral (04/05 8889) ?BP: 116/68 (04/05 1355) ?Pulse Rate: 58 (04/05 1355) ? ?Labs: ?Recent Labs  ?  04/25/21 ?1694  ?HGB 11.6*  ?HCT 34.9*  ?PLT 201  ?LABPROT 13.8  ?INR 1.1  ?CREATININE 1.01*  ? ? ?Estimated Creatinine Clearance: 41 mL/min (A) (by C-G formula based on SCr of 1.01 mg/dL (H)). ? ? ?Assessment: ?71 yo F admitted for supraclinoid L ICA aneurysm now s/p endovascular embolization on 4/5. Pharmacy has been consulted to dose and manage heparin infusion with reduced heparin level until 8AM on 04/26/21.  ? ?Goal of Therapy:  ?Heparin level 0.1-0.25 units/ml ?Monitor platelets by anticoagulation protocol: Yes ?  ?Plan:  ?Start heparin infusion at 500 units/hr, no bolus ?Check heparin level at 21:00  ?Check CBC and heparin level in AM.  ?Discontinue heparin infusion at 08:00 on 04/26/21 prior to sheath pull.  ? ?Kaleen Mask ?04/25/2021,2:52 PM ? ? ?

## 2021-04-25 NOTE — Procedures (Signed)
INR. ?Bilateral common carotid arteriograms. ?Right radial approach. ?Status post endovascular embolization of prominent wide-based left internal carotid artery paraclinoid aneurysm with a 4.5 x 14 mm pipeline Shield flow diversion device ?Postprocedural CT of the brain shows no hemorrhage. ?Extubated. ?Denies any headaches or nausea or visual difficulties. ?Pupils 3 mm round regular sluggishly reactive.  No facial asymmetry.  Tongue midline ?Moves all 4 extremities equally. ?Arlean Hopping MD. ? ?

## 2021-04-25 NOTE — Sedation Documentation (Signed)
ACT result 287 reported to Dr Estanislado Pandy ?

## 2021-04-25 NOTE — Transfer of Care (Signed)
Immediate Anesthesia Transfer of Care Note ? ?Patient: Kimberly Mccormick ? ?Procedure(s) Performed: IR WITH ANESTHESIA EMBOLIZATION ? ?Patient Location: PACU ? ?Anesthesia Type:General ? ?Level of Consciousness: awake, alert  and oriented ? ?Airway & Oxygen Therapy: Patient Spontanous Breathing and Patient connected to nasal cannula oxygen ? ?Post-op Assessment: Report given to RN and Post -op Vital signs reviewed and stable ? ?Post vital signs: Reviewed and stable ? ?Last Vitals:  ?Vitals Value Taken Time  ?BP 104/58 04/25/21 1225  ?Temp    ?Pulse 57 04/25/21 1237  ?Resp 15 04/25/21 1237  ?SpO2 100 % 04/25/21 1237  ?Vitals shown include unvalidated device data. ? ?Last Pain:  ?Vitals:  ? 04/25/21 0651  ?TempSrc:   ?PainSc: 0-No pain  ?   ? ?  ? ?Complications: No notable events documented. ?

## 2021-04-25 NOTE — Progress Notes (Signed)
Orthopedic Tech Progress Note ?Patient Details:  ?Kimberly Mccormick ?1950/11/21 ?[id number] ? ?Ortho Devices ?Type of Ortho Device: Sling arm elevator ?Ortho Device/Splint Location: RUE ?Ortho Device/Splint Interventions: Ordered, Application, Adjustment ?  ?Post Interventions ?Patient Tolerated: Well ?MD requested arm elevator to help reduce swell in pts forearm. ? ?[place] ?04/25/2021, 11:19 PM ? ?

## 2021-04-25 NOTE — Anesthesia Procedure Notes (Signed)
Procedure Name: Intubation ?Date/Time: 04/25/2021 9:09 AM ?Performed by: Dorthea Cove, CRNA ?Pre-anesthesia Checklist: Patient identified, Emergency Drugs available, Suction available and Patient being monitored ?Patient Re-evaluated:Patient Re-evaluated prior to induction ?Oxygen Delivery Method: Circle system utilized ?Preoxygenation: Pre-oxygenation with 100% oxygen ?Induction Type: IV induction ?Ventilation: Mask ventilation without difficulty ?Laryngoscope Size: Mac and 3 ?Grade View: Grade I ?Tube type: Oral ?Tube size: 7.0 mm ?Number of attempts: 1 ?Airway Equipment and Method: Stylet and Oral airway ?Placement Confirmation: ETT inserted through vocal cords under direct vision, positive ETCO2 and breath sounds checked- equal and bilateral ?Secured at: 22 cm ?Tube secured with: Tape ?Dental Injury: Teeth and Oropharynx as per pre-operative assessment  ? ? ? ? ?

## 2021-04-26 ENCOUNTER — Encounter (HOSPITAL_COMMUNITY): Payer: Self-pay | Admitting: Interventional Radiology

## 2021-04-26 LAB — CBC WITH DIFFERENTIAL/PLATELET
Abs Immature Granulocytes: 0.05 10*3/uL (ref 0.00–0.07)
Basophils Absolute: 0 10*3/uL (ref 0.0–0.1)
Basophils Relative: 0 %
Eosinophils Absolute: 0 10*3/uL (ref 0.0–0.5)
Eosinophils Relative: 0 %
HCT: 30.5 % — ABNORMAL LOW (ref 36.0–46.0)
Hemoglobin: 10.2 g/dL — ABNORMAL LOW (ref 12.0–15.0)
Immature Granulocytes: 1 %
Lymphocytes Relative: 16 %
Lymphs Abs: 1.7 10*3/uL (ref 0.7–4.0)
MCH: 27.4 pg (ref 26.0–34.0)
MCHC: 33.4 g/dL (ref 30.0–36.0)
MCV: 82 fL (ref 80.0–100.0)
Monocytes Absolute: 0.6 10*3/uL (ref 0.1–1.0)
Monocytes Relative: 6 %
Neutro Abs: 8.3 10*3/uL — ABNORMAL HIGH (ref 1.7–7.7)
Neutrophils Relative %: 77 %
Platelets: 194 10*3/uL (ref 150–400)
RBC: 3.72 MIL/uL — ABNORMAL LOW (ref 3.87–5.11)
RDW: 13 % (ref 11.5–15.5)
WBC: 10.6 10*3/uL — ABNORMAL HIGH (ref 4.0–10.5)
nRBC: 0 % (ref 0.0–0.2)

## 2021-04-26 LAB — BASIC METABOLIC PANEL
Anion gap: 5 (ref 5–15)
BUN: 12 mg/dL (ref 8–23)
CO2: 22 mmol/L (ref 22–32)
Calcium: 8.2 mg/dL — ABNORMAL LOW (ref 8.9–10.3)
Chloride: 113 mmol/L — ABNORMAL HIGH (ref 98–111)
Creatinine, Ser: 0.66 mg/dL (ref 0.44–1.00)
GFR, Estimated: >60 mL/min (ref >60)
Glucose, Bld: 114 mg/dL — ABNORMAL HIGH (ref 70–99)
Potassium: 3.4 mmol/L — ABNORMAL LOW (ref 3.5–5.1)
Sodium: 140 mmol/L (ref 135–145)

## 2021-04-26 LAB — GLUCOSE, CAPILLARY
Glucose-Capillary: 120 mg/dL — ABNORMAL HIGH (ref 70–99)
Glucose-Capillary: 92 mg/dL (ref 70–99)
Glucose-Capillary: 128 mg/dL — ABNORMAL HIGH (ref 70–99)

## 2021-04-26 MED ORDER — METOPROLOL SUCCINATE ER 25 MG PO TB24
25.0000 mg | ORAL_TABLET | Freq: Every day | ORAL | Status: DC
  Administered 2021-04-26 – 2021-04-27 (×2): 25 mg via ORAL
  Filled 2021-04-26 (×2): qty 1

## 2021-04-26 NOTE — Progress Notes (Signed)
?  Transition of Care (TOC) Screening Note ? ? ?Patient Details  ?Name: Kimberly Mccormick ?Date of Birth: 10-11-1950 ? ? ?Transition of Care [facility]) CM/SW Contact:    ?Ella Bodo, RN ?Phone Number: ?04/26/2021, 4:50 PM ? ? ? ?Transition of Care Department [facility]) has reviewed patient and no TOC needs have been identified at this time. We will continue to monitor patient advancement through interdisciplinary progression rounds. If new patient transition needs arise, please place a TOC consult. ? ?Reinaldo Raddle, RN, BSN  ?Trauma/Neuro ICU Case Manager ?[phone] ? ?

## 2021-04-26 NOTE — Progress Notes (Signed)
RN spoke with Dr. Estanislado Pandy and was instructed to hang patient's hand in sling for next few hours.  At lunch time the patient is to get up to the chair, at which time we will place a shoulder sling on the patient.  Will continue to assess and act accordingly.   ?

## 2021-04-26 NOTE — Progress Notes (Signed)
Patient ID: Kimberly Mccormick, female   DOB: [birth year], 71 y.o.   MRN: [id number] ?Interventional neuroradiology. ?Post procedure day 1. ? ?Patient complains of no headaches nausea vomiting.  Denies any speech or vision or motor symptoms. ? ?She reports no pain in her right hand and forearm at rest ? ?On palpation she reports an 8 out of 10 pain over the dorsal aspect of the hand.  She denies any paresthesias, or wrist pain. ?Vitals remain stable.  Blood pressure in the 140s over 70s by art line. ? ?Denies any chest pain shortness of breath palpitations, or wheezing or coughing. ? ?Patient able to tolerate oral and semisolids without difficulty. ? ?On examination the right hand palmar and dorsal surfaces appear appreciably less swollen with less tightening of the overlying skin.   ?Hand and fingers are cool to touch compared to the opposite side.  Near equal handgrip bilaterally.  Sensation to light touch Intact. ? ?The radial pulse proximal and distal to the puncture site is readily dopplerable.  The right ulnar pulse is also dopplerable unchanged. ?The oval hematoma proximal to the radial puncture site is less prominent and less tender. ?Plan ?Continue with the dual antiplatelets and home medications. ?Continue with elevation of the arm and hand. ?Will await evaluation and recommendations per Dr. Tempie Donning hand surgeon. ? ?Arlean Hopping MD. ?

## 2021-04-26 NOTE — Consult Note (Signed)
? ?HAND SURGERY CONSULTATION ? ?REQUESTING PHYSICIAN: Luanne Bras, MD ? ?Chief Complaint: Right hand swelling ? ?HPI: ?Kimberly Mccormick is a 71 y.o. female who presents with diffuse swelling of her right hand after percutaneous embolization of a left carotid aneurysm yesterday through a radial artery access.  She developed swelling and pain in this hand yesterday afternoon.  She initially had no complaints after the procedure and an inflatable cuff was applied to the wrist for access site hemostasis.  Her RUE was elevated overnight.  She previously complained of diffuse hand pain but this is improved this morning.  Most of her pain is at the dorsal aspect of the hand in area of diffuse swelling.  She denies any numbness or paresthesias in this hand.  She is able to make a full fist with minimal discomfort.  ? ? ?Past Medical History:  ?Diagnosis Date  ? Aneurysm of left internal carotid artery   ? Small supraclinoid 5 mm  ? Breast cancer ([place])   ? Left s/p lumpectomy (05/06/2018) and adjuvant chemotherapy (06/23/2018) with Adriamycin and Cytoxan x4 followed by Taxol weekly x12  ? DDD (degenerative disc disease), lumbar   ? Essential hypertension   ? Genital warts   ? GERD (gastroesophageal reflux disease)   ? History of anemia   ? History of renal insufficiency   ? Hypertension   ? Insomnia   ? Iron deficiency anemia   ? Ischemic stroke ([place])   ? December 2022  ? Mixed hyperlipidemia   ? Osteoarthritis   ? Parkinson's disease ([place])   ? Peripheral neuropathy   ? Related to chemotherapy  ? ?Past Surgical History:  ?Procedure Laterality Date  ? APPENDECTOMY  1966  ? BIOPSY  08/29/2020  ? Procedure: BIOPSY;  Surgeon: Montez Morita, Quillian Quince, MD;  [facility];  Service: Gastroenterology;;  ? BREAST LUMPECTOMY WITH RADIOACTIVE SEED AND SENTINEL LYMPH NODE BIOPSY Left 05/06/2018  ? Procedure: LEFT BREAST LUMPECTOMY WITH RADIOACTIVE SEED AND LEFT DEEP AXILLARY SENTINEL LYMPH NODE BIOPSY AND BLUE DYE INJECTION;   Surgeon: Fanny Skates, MD;  Location: [place];  Service: General;  Laterality: Left;  ? CATARACT EXTRACTION W/PHACO Left 01/10/2014  ? Procedure: CATARACT EXTRACTION PHACO AND INTRAOCULAR LENS PLACEMENT ; CDE:  4.94;  Surgeon: Williams Che, MD;  Location: [facility];  Service: Ophthalmology;  Laterality: Left;  ? [place]  ? CHOLECYSTECTOMY    ? COLONOSCOPY WITH PROPOFOL N/A 07/25/2020  ? Procedure: COLONOSCOPY WITH PROPOFOL;  Surgeon: Harvel Quale, MD;  [facility];  Service: Gastroenterology;  Laterality: N/A;  10:55  ? ESOPHAGOGASTRODUODENOSCOPY (EGD) WITH PROPOFOL N/A 08/29/2020  ? Procedure: ESOPHAGOGASTRODUODENOSCOPY (EGD) WITH PROPOFOL;  Surgeon: Harvel Quale, MD;  [facility];  Service: Gastroenterology;  Laterality: N/A;  12:00  ? IR RADIOLOGIST EVAL & MGMT  03/22/2021  ? IR RADIOLOGIST EVAL & MGMT  03/28/2021  ? MYRINGOTOMY WITH TUBE PLACEMENT Right 02/04/2017  ? Procedure: REVISION OF RIGHT MYRINGOTOMY WITH TUBE PLACEMENT, WITH EXAM OF LEFT EAR;  Surgeon: Leta Baptist, MD;  Location: [place];  Service: ENT;  Laterality: Right;  ? NASAL SINUS SURGERY  2016  ? polypectomy  ? POLYPECTOMY  07/25/2020  ? Procedure: POLYPECTOMY;  Surgeon: Harvel Quale, MD;  [facility];  Service: Gastroenterology;;  ? POLYPECTOMY  08/29/2020  ? Procedure: POLYPECTOMY;  Surgeon: Harvel Quale, MD;  [facility];  Service: Gastroenterology;;  ? [facility]  REMOVAL N/A 01/06/2019  ? Procedure: REMOVAL PORT-A-CATH;  Surgeon: Fanny Skates, MD;  Location: [place];  Service: General;  Laterality: N/A;  ? PORTACATH PLACEMENT Right 06/16/2018  ? Procedure: INSERTION PORT-A-CATH;  Surgeon: Fanny Skates, MD;  Location: [place];  Service: General;  Laterality: Right;  ? RADIOLOGY WITH ANESTHESIA N/A 04/25/2021  ? Procedure: IR WITH ANESTHESIA EMBOLIZATION;  Surgeon:  Luanne Bras, MD;  Location: [place];  Service: Radiology;  Laterality: N/A;  ? SUBMUCOSAL LIFTING INJECTION  08/29/2020  ? Procedure: SUBMUCOSAL LIFTING INJECTION;  Surgeon: Montez Morita, Quillian Quince, MD;  [facility];  Service: Gastroenterology;;  ? TONSILLECTOMY  1970  ? ?Social History  ? ?Socioeconomic History  ? Marital status: Legally Separated  ?  Spouse name: Not on file  ? Number of children: 2  ? Years of education: Not on file  ? Highest education level: Not on file  ?Occupational History  ?  Comment: retired  ?Tobacco Use  ? Smoking status: Never  ? Smokeless tobacco: Never  ?Vaping Use  ? Vaping Use: Never used  ?Substance and Sexual Activity  ? Alcohol use: No  ? Drug use: No  ? Sexual activity: Not Currently  ?  Birth control/protection: Post-menopausal  ?Other Topics Concern  ? Not on file  ?Social History Narrative  ? One son lives with her  ? ?Social Determinants of Health  ? ?Financial Resource Strain: Low Risk   ? Difficulty of Paying Living Expenses: Not hard at all  ?Food Insecurity: No Food Insecurity  ? Worried About Charity fundraiser in the Last Year: Never true  ? Ran Out of Food in the Last Year: Never true  ?Transportation Needs: No Transportation Needs  ? Lack of Transportation (Medical): No  ? Lack of Transportation (Non-Medical): No  ?Physical Activity: Inactive  ? Days of Exercise per Week: 0 days  ? Minutes of Exercise per Session: 0 min  ?Stress: No Stress Concern Present  ? Feeling of Stress : Not at all  ?Social Connections: Moderately Isolated  ? Frequency of Communication with Friends and Family: More than three times a week  ? Frequency of Social Gatherings with Friends and Family: More than three times a week  ? Attends Religious Services: More than 4 times per year  ? Active Member of Clubs or Organizations: No  ? Attends Archivist Meetings: Never  ? Marital Status: Separated  ? ?Family History  ?Problem Relation Age of Onset  ? Lupus Mother   ?  Heart attack Father   ?     age 22  ? Prostate cancer Father   ? Hypothyroidism Sister   ? Breast cancer Sister   ? Hypertension Sister   ? Breast cancer Sister   ? Hypertension Brother   ? Parkinson's disease Paternal Aunt   ? Stroke Neg Hx   ? ?- negative except otherwise stated in the family history section ?Allergies  ?Allergen Reactions  ? Eggs Or Egg-Derived Products Nausea And Vomiting  ? Other Nausea And Vomiting  ?  Dairy products\ ?  ? Penicillins Hives  ?  Has patient had a PCN reaction causing immediate rash, facial/tongue/throat swelling, SOB or lightheadedness with hypotension: No ?Has patient had a PCN reaction causing severe rash involving mucus membranes or skin necrosis: No ?Has patient had a PCN reaction that required hospitalization: No ?Has patient had a PCN reaction occurring within the last 10 years: No ?If all of the above  answers are "NO", then may proceed with Cephalosporin use. ?  ? ?Prior to Admission medications   ?Medication Sig Start Date End Date Taking? Authorizing Provider  ?acyclovir (ZOVIRAX) 400 MG tablet Take 400 mg by mouth 2 (two) times daily.    Yes [provider]  ?ALPRAZolam Duanne Moron) 0.5 MG tablet Take 0.5 mg by mouth daily as needed for anxiety. 01/12/21  Yes [provider]  ?aspirin EC 81 MG EC tablet Take 1 tablet (81 mg total) by mouth daily. Swallow whole. 01/05/21  Yes Geradine Girt, DO  ?cetirizine (ZYRTEC) 10 MG tablet Take 10 mg by mouth daily as needed for allergies.   Yes [provider]  ?Cholecalciferol (VITAMIN D) 2000 UNITS tablet Take 2,000 Units by mouth daily.   Yes [provider]  ?ferrous sulfate 325 (65 FE) MG tablet Take 325 mg by mouth daily with breakfast.   Yes [provider]  ?metoprolol succinate (TOPROL-XL) 25 MG 24 hr tablet Take 25 mg by mouth daily. 05/28/17  Yes [provider]  ?omeprazole (PRILOSEC) 40 MG capsule Take 1 capsule (40 mg total) by mouth daily. ?Patient taking  differently: Take 40 mg by mouth daily as needed (Acid reflux). 11/27/20  Yes Harvel Quale, MD  ?pramipexole (MIRAPEX) 0.5 MG tablet Take 1 tablet (0.5 mg total) by mouth 3 (three) times daily. 12/07/20

## 2021-04-26 NOTE — Progress Notes (Signed)
? ? ?Referring Physician(s): ?Deveshwar, Sanjeev ? ?Supervising Physician: Luanne Bras ? ?Patient Status:  [facility] - In-pt ? ?Chief Complaint: ? ?Status post endovascular embolization of prominent wide-based left internal carotid artery paraclinoid aneurysm with pipeline Shield flow diversion device 04/25/21 with Dr. Arlean Hopping. Pt had significant swelling overnight to R palmar and dorsal hand with hematoma proximal to R radial puncture site.  ? ?Subjective: ? ?Pt sitting upright in chair. She c/o mild tenderness to R hand. She denies decreased sensation in hand. She states that swelling has decreased.  ? ?Allergies: ?Eggs or egg-derived products, Other, and Penicillins ? ?Medications: ?Prior to Admission medications   ?Medication Sig Start Date End Date Taking? Authorizing Provider  ?acyclovir (ZOVIRAX) 400 MG tablet Take 400 mg by mouth 2 (two) times daily.    Yes [provider]  ?ALPRAZolam Duanne Moron) 0.5 MG tablet Take 0.5 mg by mouth daily as needed for anxiety. 01/12/21  Yes [provider]  ?aspirin EC 81 MG EC tablet Take 1 tablet (81 mg total) by mouth daily. Swallow whole. 01/05/21  Yes Geradine Girt, DO  ?cetirizine (ZYRTEC) 10 MG tablet Take 10 mg by mouth daily as needed for allergies.   Yes [provider]  ?Cholecalciferol (VITAMIN D) 2000 UNITS tablet Take 2,000 Units by mouth daily.   Yes [provider]  ?ferrous sulfate 325 (65 FE) MG tablet Take 325 mg by mouth daily with breakfast.   Yes [provider]  ?metoprolol succinate (TOPROL-XL) 25 MG 24 hr tablet Take 25 mg by mouth daily. 05/28/17  Yes [provider]  ?omeprazole (PRILOSEC) 40 MG capsule Take 1 capsule (40 mg total) by mouth daily. ?Patient taking differently: Take 40 mg by mouth daily as needed (Acid reflux). 11/27/20  Yes Harvel Quale, MD  ?pramipexole (MIRAPEX) 0.5 MG tablet Take 1 tablet (0.5 mg total) by mouth 3 (three) times daily. 12/07/20  Yes Star Age,  MD  ?rosuvastatin (CRESTOR) 20 MG tablet Take 1 tablet (20 mg total) by mouth daily. 04/17/21  Yes Satira Sark, MD  ?diclofenac Sodium (VOLTAREN) 1 % GEL Apply 2 g topically 4 (four) times daily. ?Patient taking differently: Apply 2 g topically daily as needed (pain). 01/05/21   Geradine Girt, DO  ?HYDROcodone-acetaminophen (NORCO/VICODIN) 5-325 MG tablet Take 1 tablet by mouth every 6 (six) hours as needed for severe pain. 02/04/21   Ripley Fraise, MD  ?PROCTO-MED [facility] 2.5 % rectal cream Place 1 application. rectally 2 (two) times daily as needed for hemorrhoids. 07/19/20   [provider]  ?zolpidem (AMBIEN) 10 MG tablet Take 5 mg by mouth at bedtime as needed for sleep.    [provider]  ? ? ? ?Vital Signs: ?BP 134/80   Pulse 65   Temp 97.6 ?F (36.4 ?C) (Oral)   Resp 17   Ht '5\' 3"'$  (1.6 m)   Wt 130 lb (59 kg)   SpO2 100%   BMI 23.03 kg/m?  ? ?Physical Exam ?Constitutional:   ?   Appearance: Normal appearance.  ?HENT:  ?   Head: Normocephalic and atraumatic.  ?Eyes:  ?   Extraocular Movements: Extraocular movements intact.  ?   Pupils: Pupils are equal, round, and reactive to light.  ?Pulmonary:  ?   Effort: Pulmonary effort is normal. No respiratory distress.  ?Musculoskeletal:     ?   General: Swelling and tenderness present.  ?   Comments: Swelling and tenderness to R palmar and dorsal hand. R hand  is cool to touch. Pulses palpable. Full ROM and sensation.   ?Skin: ?   General: Skin is warm and dry.  ?   Capillary Refill: Capillary refill takes 2 to 3 seconds.  ?Neurological:  ?   Mental Status: She is alert and oriented to person, place, and time.  ?   Comments: Alert, aware and oriented X 3 ?Speech and comprehension is intact.  ?PERRL bilaterally ?No facial droop noted ?Can spontaneously move all 4 extremities. ? ? ? ? ? ? ?  ?  ?Psychiatric:     ?   Mood and Affect: Mood normal.     ?   Behavior: Behavior normal.     ?   Thought Content: Thought content normal.     ?    Judgment: Judgment normal.  ? ? ?Imaging: ?No results found. ? ?Labs: ? ?CBC: ?Recent Labs  ?  02/04/21 ?0448 04/25/21 ?0624 04/25/21 ?2154 04/26/21 ?0641  ?WBC 5.9 6.6 8.2 10.6*  ?HGB 12.4 11.6* 11.8* 10.2*  ?HCT 37.5 34.9* 35.4* 30.5*  ?PLT 215 201 209 194  ? ? ?COAGS: ?Recent Labs  ?  04/25/21 ?0624  ?INR 1.1  ? ? ?BMP: ?Recent Labs  ?  02/04/21 ?0448 03/26/21 ?0300 04/25/21 ?0624 04/26/21 ?0641  ?NA 142 138 139 140  ?K 3.3* 3.4* 3.6 3.4*  ?CL 107 106 108 113*  ?CO2 '25 25 24 22  '$ ?GLUCOSE 119* 101* 98 114*  ?BUN '16 16 18 12  '$ ?CALCIUM 9.3 8.8* 9.1 8.2*  ?CREATININE 0.82 0.78 1.01* 0.66  ?GFRNONAA >60 >60 59* >60  ? ? ?LIVER FUNCTION TESTS: ?Recent Labs  ?  06/29/20 ?1052 01/04/21 ?0646  ?BILITOT 1.0 1.2  ?AST 18 28  ?ALT 13 29  ?ALKPHOS 96 85  ?PROT 6.9 6.8  ?ALBUMIN 3.6 3.6  ? ? ?Assessment and Plan: ? ?Status post endovascular embolization of prominent wide-based left internal carotid artery paraclinoid aneurysm with pipeline Shield flow diversion device 04/25/21 with Dr. Arlean Hopping. Pt had significant swelling overnight to R palmar and dorsal hand with hematoma proximal to R radial puncture site.  ? ?Hand surgery consult concluded no intervention needed at this time.  ?Pt has R arm/hand in shoulder sling to provide elevation.  ?Pt educated to keep R hand elevated. She will stay another night for observation.  ?Pt knows to report pain, loss of sensation or other changes to R hand immediately.  ?Plan to d/c 04/27/21 per Dr. Estanislado Pandy ? ?Electronically Signed: ?Tyson Alias, NP ?04/26/2021, 3:54 PM ? ? ?I spent a total of 15 Minutes at the the patient's bedside AND on the patient's hospital floor or unit, greater than 50% of which was counseling/coordinating care for Status post endovascular embolization of prominent wide-based left internal carotid artery paraclinoid aneurysm with pipeline Shield flow diversion device 04/25/21 with Dr. Arlean Hopping and significant hematoma to R radial puncture site.  ? ? ? ?  ?

## 2021-04-26 NOTE — Progress Notes (Signed)
Deveshwar on floor to see pt. Held pressure for 20 mins. Ordered to elevate pt's arm even more. Ordered ortho sling. Applied to pt.  ?

## 2021-04-26 NOTE — Anesthesia Postprocedure Evaluation (Signed)
Anesthesia Post Note ? ?Patient: Kimberly Mccormick ? ?Procedure(s) Performed: IR WITH ANESTHESIA EMBOLIZATION ? ?  ? ?Patient location during evaluation: PACU ?Anesthesia Type: General ?Level of consciousness: awake and alert ?Pain management: pain level controlled ?Vital Signs Assessment: post-procedure vital signs reviewed and stable ?Respiratory status: spontaneous breathing, nonlabored ventilation, respiratory function stable and patient connected to nasal cannula oxygen ?Cardiovascular status: blood pressure returned to baseline and stable ?Postop Assessment: no apparent nausea or vomiting ?Anesthetic complications: no ? ? ?No notable events documented. ? ?Last Vitals:  ?Vitals:  ? 04/26/21 1900 04/26/21 2000  ?BP: (!) 139/92   ?Pulse: 62   ?Resp: 18   ?Temp:  36.4 ?C  ?SpO2: 100%   ?  ?Last Pain:  ?Vitals:  ? 04/26/21 2000  ?TempSrc: Oral  ?PainSc:   ? ? ?  ?  ?  ?  ?  ?  ? ?[place] S ? ? ? ? ?

## 2021-04-26 NOTE — Progress Notes (Signed)
Orthopedic Tech Progress Note ?Patient Details:  ?Kimberly Mccormick ?1950-04-16 ?[id number] ? ?Ortho Devices ?Type of Ortho Device: Arm sling ?Ortho Device/Splint Location: RUE ?Ortho Device/Splint Interventions: Application, Ordered, Adjustment ?  ?Post Interventions ?Patient Tolerated: Well ? ?Kimberly Mccormick ?04/26/2021, 10:32 AM ? ?

## 2021-04-27 MED ORDER — ALUM & MAG HYDROXIDE-SIMETH 200-200-20 MG/5ML PO SUSP
30.0000 mL | ORAL | Status: DC | PRN
  Administered 2021-04-27: 30 mL via ORAL
  Filled 2021-04-27: qty 30

## 2021-04-27 MED ORDER — TICAGRELOR 90 MG PO TABS: 90.0000 mg | ORAL_TABLET | Freq: Two times a day (BID) | ORAL | 2 refills | Status: DC

## 2021-04-27 NOTE — Discharge Summary (Signed)
? ?Patient ID: ?Kimberly Mccormick ?MRN: [id number] ?DOB/AGE: 08-25-1950 71 y.o. ? ?Admit date: 04/25/2021 ?Discharge date: 04/27/2021 ? ?Supervising Physician: Luanne Bras ? ?Patient Status: [facility] - In-pt ? ?Admission Diagnoses: Left internal carotid artery emboliza ? ?Discharge Diagnoses:  ?Principal Problem: ?  Brain aneurysm ? ? ?Discharged Condition: good ? ?Hospital Course: L Internal carotid artery aneurysm embolization in NIR with Dr Estanislado Pandy 04/25/21 ?Procedure went well-- no complications ?After procedure-- Rt radial site hematoma developed. ?Was seen by Ortho Dr Tempie Donning 04/26/21--- recommendation rest/elevation/sling ?Has done well overnight ?Denies headache; denies N/V ?Denies speech or vision changes ?Rt radial site is much less tender; hematoma resolving ?Good movement and sensation in Rt wrist/hand ?UOP great/yellow ?Passing gas ?Plan for discharge today ?Dr Corena Pilgrim has seen and evaluated pt. ? ? ?Consults:  Ortho ? ?Significant Diagnostic Studies: Cerebral arteriogram with Intervention 04/25/21 ? ?Treatments: Status post endovascular treatment of a right paraclinoid aneurysm ?with a 4.75 x 14 pipeline shield flow diverter device with stasis. ?  ? ?Discharge Exam: ?Blood pressure 136/77, pulse (!) 53, temperature 97.7 ?F (36.5 ?C), temperature source Oral, resp. rate 19, height '5\' 3"'$  (1.6 m), weight 130 lb (59 kg), SpO2 99 %. ? ?A/O ?Appropriate ?Pleasant  ?Heart: RRR ?Lungs: CTA ?Abd soft NT no masses ?FROM ?Moving all 4s equally ?Rt forearm site is clean and dry ?Less tender daily ?Hematoma at Radial site is smaller today ?Rt hand swelling is less today ?Moving fingers and wrist well ?Rt foot: 2+ pulses ?UOP great ?Clear yellow ? ?Results for orders placed or performed during the hospital encounter of 04/25/21  ?MRSA Next Gen by PCR, Nasal  ? Specimen: Nasal Mucosa; Nasal Swab  ?Result Value Ref Range  ? MRSA by PCR Next Gen NOT DETECTED NOT DETECTED  ?CBC with Differential/Platelet  ?Result Value Ref  Range  ? WBC 6.6 4.0 - 10.5 K/uL  ? RBC 4.19 3.87 - 5.11 MIL/uL  ? Hemoglobin 11.6 (L) 12.0 - 15.0 g/dL  ? HCT 34.9 (L) 36.0 - 46.0 %  ? MCV 83.3 80.0 - 100.0 fL  ? MCH 27.7 26.0 - 34.0 pg  ? MCHC 33.2 30.0 - 36.0 g/dL  ? RDW 13.0 11.5 - 15.5 %  ? Platelets 201 150 - 400 K/uL  ? nRBC 0.0 0.0 - 0.2 %  ? Neutrophils Relative % 50 %  ? Neutro Abs 3.3 1.7 - 7.7 K/uL  ? Lymphocytes Relative 37 %  ? Lymphs Abs 2.4 0.7 - 4.0 K/uL  ? Monocytes Relative 7 %  ? Monocytes Absolute 0.5 0.1 - 1.0 K/uL  ? Eosinophils Relative 5 %  ? Eosinophils Absolute 0.3 0.0 - 0.5 K/uL  ? Basophils Relative 1 %  ? Basophils Absolute 0.0 0.0 - 0.1 K/uL  ? Immature Granulocytes 0 %  ? Abs Immature Granulocytes 0.02 0.00 - 0.07 K/uL  ?Basic metabolic panel  ?Result Value Ref Range  ? Sodium 139 135 - 145 mmol/L  ? Potassium 3.6 3.5 - 5.1 mmol/L  ? Chloride 108 98 - 111 mmol/L  ? CO2 24 22 - 32 mmol/L  ? Glucose, Bld 98 70 - 99 mg/dL  ? BUN 18 8 - 23 mg/dL  ? Creatinine, Ser 1.01 (H) 0.44 - 1.00 mg/dL  ? Calcium 9.1 8.9 - 10.3 mg/dL  ? GFR, Estimated 59 (L) >60 mL/min  ? Anion gap 7 5 - 15  ?Protime-INR  ?Result Value Ref Range  ? Prothrombin Time 13.8 11.4 - 15.2 seconds  ? INR 1.1 0.8 -  1.2  ?Heparin level (unfractionated)  ?Result Value Ref Range  ? Heparin Unfractionated >1.10 (H) 0.30 - 0.70 IU/mL  ?CBC  ?Result Value Ref Range  ? WBC 8.2 4.0 - 10.5 K/uL  ? RBC 4.32 3.87 - 5.11 MIL/uL  ? Hemoglobin 11.8 (L) 12.0 - 15.0 g/dL  ? HCT 35.4 (L) 36.0 - 46.0 %  ? MCV 81.9 80.0 - 100.0 fL  ? MCH 27.3 26.0 - 34.0 pg  ? MCHC 33.3 30.0 - 36.0 g/dL  ? RDW 12.8 11.5 - 15.5 %  ? Platelets 209 150 - 400 K/uL  ? nRBC 0.0 0.0 - 0.2 %  ?CBC with Differential/Platelet  ?Result Value Ref Range  ? WBC 10.6 (H) 4.0 - 10.5 K/uL  ? RBC 3.72 (L) 3.87 - 5.11 MIL/uL  ? Hemoglobin 10.2 (L) 12.0 - 15.0 g/dL  ? HCT 30.5 (L) 36.0 - 46.0 %  ? MCV 82.0 80.0 - 100.0 fL  ? MCH 27.4 26.0 - 34.0 pg  ? MCHC 33.4 30.0 - 36.0 g/dL  ? RDW 13.0 11.5 - 15.5 %  ? Platelets 194 150 -  400 K/uL  ? nRBC 0.0 0.0 - 0.2 %  ? Neutrophils Relative % 77 %  ? Neutro Abs 8.3 (H) 1.7 - 7.7 K/uL  ? Lymphocytes Relative 16 %  ? Lymphs Abs 1.7 0.7 - 4.0 K/uL  ? Monocytes Relative 6 %  ? Monocytes Absolute 0.6 0.1 - 1.0 K/uL  ? Eosinophils Relative 0 %  ? Eosinophils Absolute 0.0 0.0 - 0.5 K/uL  ? Basophils Relative 0 %  ? Basophils Absolute 0.0 0.0 - 0.1 K/uL  ? Immature Granulocytes 1 %  ? Abs Immature Granulocytes 0.05 0.00 - 0.07 K/uL  ?Basic metabolic panel  ?Result Value Ref Range  ? Sodium 140 135 - 145 mmol/L  ? Potassium 3.4 (L) 3.5 - 5.1 mmol/L  ? Chloride 113 (H) 98 - 111 mmol/L  ? CO2 22 22 - 32 mmol/L  ? Glucose, Bld 114 (H) 70 - 99 mg/dL  ? BUN 12 8 - 23 mg/dL  ? Creatinine, Ser 0.66 0.44 - 1.00 mg/dL  ? Calcium 8.2 (L) 8.9 - 10.3 mg/dL  ? GFR, Estimated >60 >60 mL/min  ? Anion gap 5 5 - 15  ?Glucose, capillary  ?Result Value Ref Range  ? Glucose-Capillary 128 (H) 70 - 99 mg/dL  ?Glucose, capillary  ?Result Value Ref Range  ? Glucose-Capillary 152 (H) 70 - 99 mg/dL  ?Glucose, capillary  ?Result Value Ref Range  ? Glucose-Capillary 120 (H) 70 - 99 mg/dL  ?Glucose, capillary  ?Result Value Ref Range  ? Glucose-Capillary 92 70 - 99 mg/dL  ?Glucose, capillary  ?Result Value Ref Range  ? Glucose-Capillary 128 (H) 70 - 99 mg/dL  ?POCT Activated clotting time  ?Result Value Ref Range  ? Activated Clotting Time 179 seconds  ?  ? ?Disposition: L ICA aneurysm embolization 04/25/21 with Dr Estanislado Pandy ?Doing well ?Rt radial procedure site is clean and dry- small hematoma resolving ?Good pulses ?Movement is better daily ?Plan for DC today ?Continue home meds and ASA/Brilinta ?2 week follow up with Dr Rob Hickman ?Pt has good understanding of DC planning ? ? ?Discharge Instructions   ? ? Call MD for:  difficulty breathing, headache or visual disturbances   Complete by: As directed ?  ? Call MD for:  extreme fatigue   Complete by: As directed ?  ? Call MD for:  hives   Complete by: As directed ?  ?  Call MD for:   persistant dizziness or light-headedness   Complete by: As directed ?  ? Call MD for:  persistant nausea and vomiting   Complete by: As directed ?  ? Call MD for:  redness, tenderness, or signs of infection (pain, swelling, redness, odor or green/yellow discharge around incision site)   Complete by: As directed ?  ? Call MD for:  severe uncontrolled pain   Complete by: As directed ?  ? Call MD for:  temperature >100.4   Complete by: As directed ?  ? Diet - low sodium heart healthy   Complete by: As directed ?  ? Discharge instructions   Complete by: As directed ?  ? Continue home meds--- continue ASA daily and Brilinta 2x/day; 2 week follow up with Dr Estanislado Pandy - pt will hear from scheduler for time and date; call [phone] if any needs/concerns  ? Discharge wound care:   Complete by: As directed ?  ? Keep clean bandaid on Rt radial site x 1 week; can use sling and keep t arm elevated as much as possible for 1 week  ? Driving Restrictions   Complete by: As directed ?  ? No driving x 2 weeks  ? Increase activity slowly   Complete by: As directed ?  ? No lifting over 10 lbs x 2 weeks; restful for weekend  ? ?  ? ?Allergies as of 04/27/2021   ? ?   Reactions  ? Eggs Or Egg-derived Products Nausea And Vomiting  ? Other Nausea And Vomiting  ? Dairy products\  ? Penicillins Hives  ? Has patient had a PCN reaction causing immediate rash, facial/tongue/throat swelling, SOB or lightheadedness with hypotension: No ?Has patient had a PCN reaction causing severe rash involving mucus membranes or skin necrosis: No ?Has patient had a PCN reaction that required hospitalization: No ?Has patient had a PCN reaction occurring within the last 10 years: No ?If all of the above answers are "NO", then may proceed with Cephalosporin use.  ? ?  ? ?  ?Medication List  ?  ? ?TAKE these medications   ? ?acyclovir 400 MG tablet ?Commonly known as: ZOVIRAX ?Take 400 mg by mouth 2 (two) times daily. ?  ?ALPRAZolam 0.5 MG tablet ?Commonly known  as: Duanne Moron ?Take 0.5 mg by mouth daily as needed for anxiety. ?  ?aspirin 81 MG EC tablet ?Take 1 tablet (81 mg total) by mouth daily. Swallow whole. ?  ?cetirizine 10 MG tablet ?Commonly known as: ZYRTEC ?Take 1

## 2021-04-27 NOTE — Plan of Care (Signed)
Pt progressing well. Discharge instructions reviewed with patient and her son. All questions answered.  ?

## 2021-05-11 ENCOUNTER — Ambulatory Visit (HOSPITAL_COMMUNITY)
Admission: RE | Admit: 2021-05-11 | Discharge: 2021-05-11 | Disposition: A | Discharge status: Home / Self Care | Payer: Medicare Other | Source: Ambulatory Visit | Attending: Radiology | Admitting: Radiology

## 2021-05-11 DIAGNOSIS — I671 Cerebral aneurysm, nonruptured: Secondary | ICD-10-CM | POA: Diagnosis not present

## 2021-05-15 HISTORY — PX: IR RADIOLOGIST EVAL & MGMT: IMG5224

## 2021-05-17 DIAGNOSIS — Z8679 Personal history of other diseases of the circulatory system: Secondary | ICD-10-CM | POA: Diagnosis not present

## 2021-05-17 DIAGNOSIS — D509 Iron deficiency anemia, unspecified: Secondary | ICD-10-CM | POA: Diagnosis not present

## 2021-05-17 DIAGNOSIS — Z6823 Body mass index (BMI) 23.0-23.9, adult: Secondary | ICD-10-CM | POA: Diagnosis not present

## 2021-05-17 DIAGNOSIS — G20 Parkinson's disease: Secondary | ICD-10-CM | POA: Diagnosis not present

## 2021-05-17 DIAGNOSIS — Z9889 Other specified postprocedural states: Secondary | ICD-10-CM | POA: Diagnosis not present

## 2021-05-17 DIAGNOSIS — I671 Cerebral aneurysm, nonruptured: Secondary | ICD-10-CM | POA: Diagnosis not present

## 2021-05-17 DIAGNOSIS — S60211A Contusion of right wrist, initial encounter: Secondary | ICD-10-CM | POA: Diagnosis not present

## 2021-05-17 DIAGNOSIS — C50912 Malignant neoplasm of unspecified site of left female breast: Secondary | ICD-10-CM | POA: Diagnosis not present

## 2021-05-28 ENCOUNTER — Ambulatory Visit (INDEPENDENT_AMBULATORY_CARE_PROVIDER_SITE_OTHER): Payer: Medicare Other | Admitting: Gastroenterology

## 2021-05-28 ENCOUNTER — Encounter (INDEPENDENT_AMBULATORY_CARE_PROVIDER_SITE_OTHER): Payer: Self-pay | Admitting: Gastroenterology

## 2021-05-28 DIAGNOSIS — R110 Nausea: Secondary | ICD-10-CM | POA: Diagnosis not present

## 2021-05-28 DIAGNOSIS — D132 Benign neoplasm of duodenum: Secondary | ICD-10-CM | POA: Diagnosis not present

## 2021-05-28 DIAGNOSIS — R112 Nausea with vomiting, unspecified: Secondary | ICD-10-CM | POA: Insufficient documentation

## 2021-05-28 MED ORDER — ONDANSETRON 4 MG PO TBDP: 4.0000 mg | ORAL_TABLET | Freq: Three times a day (TID) | ORAL | 0 refills | Status: DC | PRN

## 2021-05-28 NOTE — Progress Notes (Signed)
[name], M.D. ?Gastroenterology & Hepatology ?[facility] ?[address] ?[place], [place] [address] ? ?Primary Care Physician: ?Curlene Labrum, MD ?[address] ?[place] [address] ? ?I will communicate my assessment and recommendations to the referring MD via EMR. ? ?Problems: ?Chronic abdominal pain/nausea ?Duodenal adenoma ? ?History of Present Illness: ?Kimberly Mccormick is a 71 y.o. female with PMH breast cancer s/p , CHF, IBS, hyperlipidemia, stroke, L internal carotid aneurysm s/p embolization, hypertension, who presents for follow-up of nausea. ? ?The patient was last seen on 11/27/2020. At that time, the patient had a GES scheduled - no delayed emptying. Was started on omeprazole 40 qday, continued on Levsin as needed and advised to follow a low FODMAP diet. ? ?Had a normal CT abdomen and pelvis with IV contrast last year, only had some urinary stones. ? ?She states she has been through many medical issues recently and has presented recurrent nausea but not on a daily basis. Does not vomit when she presents nausea.  The patient denies having any fever, chills, hematochezia, melena, hematemesis, abdominal distention, abdominal pain, diarrhea, jaundice, pruritus. Believes she has lost weight compared to prior visit but weight was 1 lb higher in last visit. Has tried Boost once every day. ? ?Last EGD: 08/29/2020, nonobstructive Schatzki's ring at the GE junction, there was a 2 cm hiatal hernia, normal stomach, and 15 mm flat polyp was found in the duodenal bulb.  This was injected for endoscopic mucosal resection which was removed in a piecemeal fashion with) jumbo forceps without bleeding after resection.  Pathology was consistent with a duodenal adenoma.  Random biopsies of the small bowel were negative for any pathology.  Patient was advised to repeat EGD in 1 year. ? ?Path: ?A. DUODENUM, BULB, POLYPECTOMY:  ?- Multiple fragments of duodenal adenoma.  ?-  No high-grade dysplasia or malignancy.  ? ?B. SMALL BOWEL, BIOPSY:  ?- Benign small bowel mucosa.  ?- No villous blunting or increase in intraepithelial lymphocytes.  ?- No dysplasia or malignancy.  ? ?Recommended repeat EGD in 08/2021. ? ?Last Colonoscopy: 07/2020 ?- Hemorrhoids found on perianal exam. ?- Two 4 to 10 mm polyps in the ascending colon, removed with a cold snare. Resected and retrieved. ?- Non-bleeding internal hemorrhoids. ? ?Advised to repeat in 7 years. ? ?Past Medical History: ?Past Medical History:  ?Diagnosis Date  ? Aneurysm of left internal carotid artery   ? Small supraclinoid 5 mm  ? Breast cancer ([place])   ? Left s/p lumpectomy (05/06/2018) and adjuvant chemotherapy (06/23/2018) with Adriamycin and Cytoxan x4 followed by Taxol weekly x12  ? DDD (degenerative disc disease), lumbar   ? Essential hypertension   ? Genital warts   ? GERD (gastroesophageal reflux disease)   ? History of anemia   ? History of renal insufficiency   ? Hypertension   ? Insomnia   ? Iron deficiency anemia   ? Ischemic stroke ([place])   ? December 2022  ? Mixed hyperlipidemia   ? Osteoarthritis   ? Parkinson's disease ([place])   ? Peripheral neuropathy   ? Related to chemotherapy  ? ? ?Past Surgical History: ?Past Surgical History:  ?Procedure Laterality Date  ? APPENDECTOMY  1966  ? BIOPSY  08/29/2020  ? Procedure: BIOPSY;  Surgeon: Montez Morita, Quillian Quince, MD;  [facility];  Service: Gastroenterology;;  ? BREAST LUMPECTOMY WITH RADIOACTIVE SEED AND SENTINEL LYMPH NODE BIOPSY Left 05/06/2018  ? Procedure: LEFT BREAST LUMPECTOMY WITH  RADIOACTIVE SEED AND LEFT DEEP AXILLARY SENTINEL LYMPH NODE BIOPSY AND BLUE DYE INJECTION;  Surgeon: Fanny Skates, MD;  Location: [facility];  Service: General;  Laterality: Left;  ? CATARACT EXTRACTION W/PHACO Left 01/10/2014  ? Procedure: CATARACT EXTRACTION PHACO AND INTRAOCULAR LENS PLACEMENT ; CDE:  4.94;  Surgeon: Williams Che, MD;  Location: [facility];  Service:  Ophthalmology;  Laterality: Left;  ? [place]  ? CHOLECYSTECTOMY    ? COLONOSCOPY WITH PROPOFOL N/A 07/25/2020  ? Procedure: COLONOSCOPY WITH PROPOFOL;  Surgeon: Harvel Quale, MD;  [facility];  Service: Gastroenterology;  Laterality: N/A;  10:55  ? ESOPHAGOGASTRODUODENOSCOPY (EGD) WITH PROPOFOL N/A 08/29/2020  ? Procedure: ESOPHAGOGASTRODUODENOSCOPY (EGD) WITH PROPOFOL;  Surgeon: Harvel Quale, MD;  [facility];  Service: Gastroenterology;  Laterality: N/A;  12:00  ? IR 3D INDEPENDENT WKST  04/25/2021  ? IR ANGIO INTRA EXTRACRAN SEL COM CAROTID INNOMINATE UNI R MOD SED  04/25/2021  ? IR ANGIO INTRA EXTRACRAN SEL INTERNAL CAROTID UNI L MOD SED  04/25/2021  ? IR ANGIOGRAM FOLLOW UP STUDY  04/25/2021  ? IR CT HEAD LTD  04/25/2021  ? IR NEURO EACH ADD'L AFTER BASIC UNI LEFT (MS)  04/25/2021  ? IR RADIOLOGIST EVAL & MGMT  03/22/2021  ? IR RADIOLOGIST EVAL & MGMT  03/28/2021  ? IR RADIOLOGIST EVAL & MGMT  05/15/2021  ? IR TRANSCATH/EMBOLIZ  04/25/2021  ? IR US GUIDE VASC ACCESS RIGHT  04/25/2021  ? MYRINGOTOMY WITH TUBE PLACEMENT Right 02/04/2017  ? Procedure: REVISION OF RIGHT MYRINGOTOMY WITH TUBE PLACEMENT, WITH EXAM OF LEFT EAR;  Surgeon: Leta Baptist, MD;  Location: [facility];  Service: ENT;  Laterality: Right;  ? NASAL SINUS SURGERY  2016  ? polypectomy  ? POLYPECTOMY  07/25/2020  ? Procedure: POLYPECTOMY;  Surgeon: Harvel Quale, MD;  [facility];  Service: Gastroenterology;;  ? POLYPECTOMY  08/29/2020  ? Procedure: POLYPECTOMY;  Surgeon: Harvel Quale, MD;  [facility];  Service: Gastroenterology;;  ? PORT-A-CATH REMOVAL N/A 01/06/2019  ? Procedure: REMOVAL PORT-A-CATH;  Surgeon: Fanny Skates, MD;  Location: [facility];  Service: General;  Laterality: N/A;  ? PORTACATH PLACEMENT Right 06/16/2018  ? Procedure: INSERTION PORT-A-CATH;  Surgeon: Fanny Skates, MD;  Location: [facility];   Service: General;  Laterality: Right;  ? RADIOLOGY WITH ANESTHESIA N/A 04/25/2021  ? Procedure: IR WITH ANESTHESIA EMBOLIZATION;  Surgeon: Luanne Bras, MD;  Location: [facility];  Service: Radiology;  Laterality: N/A;  ? SUBMUCOSAL LIFTING INJECTION  08/29/2020  ? Procedure: SUBMUCOSAL LIFTING INJECTION;  Surgeon: Montez Morita, Quillian Quince, MD;  [facility];  Service: Gastroenterology;;  ? TONSILLECTOMY  1970  ? ? ?Family History: ?Family History  ?Problem Relation Age of Onset  ? Lupus Mother   ? Heart attack Father   ?     age 78  ? Prostate cancer Father   ? Hypothyroidism Sister   ? Breast cancer Sister   ? Hypertension Sister   ? Breast cancer Sister   ? Hypertension Brother   ? Parkinson's disease Paternal Aunt   ? Stroke Neg Hx   ? ? ?Social History: ?Social History  ? ?Tobacco Use  ?Smoking Status Never  ?Smokeless Tobacco Never  ? ?Social History  ? ?Substance and Sexual Activity  ?Alcohol Use No  ? ?Social History  ? ?Substance and Sexual Activity  ?Drug Use No  ? ? ?  Allergies: ?Allergies  ?Allergen Reactions  ? Eggs Or Egg-Derived Products Nausea And Vomiting  ? Other Nausea And Vomiting  ?  Dairy products\ ?  ? Penicillins Hives  ?  Has patient had a PCN reaction causing immediate rash, facial/tongue/throat swelling, SOB or lightheadedness with hypotension: No ?Has patient had a PCN reaction causing severe rash involving mucus membranes or skin necrosis: No ?Has patient had a PCN reaction that required hospitalization: No ?Has patient had a PCN reaction occurring within the last 10 years: No ?If all of the above answers are "NO", then may proceed with Cephalosporin use. ?  ? ? ?Medications: ?Current Outpatient Medications  ?Medication Sig Dispense Refill  ? acyclovir (ZOVIRAX) 400 MG tablet Take 400 mg by mouth 2 (two) times daily.     ? ALPRAZolam (XANAX) 0.5 MG tablet Take 0.5 mg by mouth daily as needed for anxiety.    ? aspirin EC 81 MG EC tablet Take 1 tablet (81 mg total) by mouth daily.  Swallow whole. 30 tablet 11  ? cetirizine (ZYRTEC) 10 MG tablet Take 10 mg by mouth daily as needed for allergies.    ? Cholecalciferol (VITAMIN D) 2000 UNITS tablet Take 2,000 Units by mouth daily.    ? di

## 2021-05-28 NOTE — Patient Instructions (Signed)
Can take Zofran as needed for nausea ?Proceed with EGD in 08/2021 ?Can take Boost or Ensure daily, up to 3 times a day ?

## 2021-06-07 ENCOUNTER — Ambulatory Visit: Payer: Medicare Other | Admitting: Neurology

## 2021-06-07 ENCOUNTER — Telehealth: Payer: Self-pay | Admitting: Hematology and Oncology

## 2021-06-07 NOTE — Telephone Encounter (Signed)
Rescheduled appointment per provider PAL. Patient is aware of the changes made to her upcoming appointment. 

## 2021-06-27 ENCOUNTER — Encounter (HOSPITAL_COMMUNITY): Payer: Self-pay | Admitting: Emergency Medicine

## 2021-06-27 ENCOUNTER — Other Ambulatory Visit: Payer: Self-pay

## 2021-06-27 ENCOUNTER — Emergency Department (HOSPITAL_COMMUNITY): Payer: Medicare Other

## 2021-06-27 ENCOUNTER — Emergency Department (HOSPITAL_COMMUNITY)
Admission: EM | Admit: 2021-06-27 | Discharge: 2021-06-27 | Disposition: A | Discharge status: Home / Self Care | Payer: Medicare Other | Attending: Emergency Medicine | Admitting: Emergency Medicine

## 2021-06-27 DIAGNOSIS — Z9049 Acquired absence of other specified parts of digestive tract: Secondary | ICD-10-CM | POA: Diagnosis not present

## 2021-06-27 DIAGNOSIS — Z7982 Long term (current) use of aspirin: Secondary | ICD-10-CM | POA: Diagnosis not present

## 2021-06-27 DIAGNOSIS — E876 Hypokalemia: Secondary | ICD-10-CM | POA: Diagnosis not present

## 2021-06-27 DIAGNOSIS — M545 Low back pain, unspecified: Secondary | ICD-10-CM | POA: Diagnosis not present

## 2021-06-27 DIAGNOSIS — R109 Unspecified abdominal pain: Secondary | ICD-10-CM | POA: Insufficient documentation

## 2021-06-27 DIAGNOSIS — M549 Dorsalgia, unspecified: Secondary | ICD-10-CM | POA: Diagnosis not present

## 2021-06-27 DIAGNOSIS — K8689 Other specified diseases of pancreas: Secondary | ICD-10-CM | POA: Diagnosis not present

## 2021-06-27 LAB — COMPREHENSIVE METABOLIC PANEL
ALT: 22 U/L (ref 0–44)
AST: 23 U/L (ref 15–41)
Albumin: 3.7 g/dL (ref 3.5–5.0)
Alkaline Phosphatase: 87 U/L (ref 38–126)
Anion gap: 6 (ref 5–15)
BUN: 14 mg/dL (ref 8–23)
CO2: 27 mmol/L (ref 22–32)
Calcium: 8.9 mg/dL (ref 8.9–10.3)
Chloride: 108 mmol/L (ref 98–111)
Creatinine, Ser: 0.98 mg/dL (ref 0.44–1.00)
GFR, Estimated: >60 mL/min (ref >60)
Glucose, Bld: 93 mg/dL (ref 70–99)
Potassium: 3.2 mmol/L — ABNORMAL LOW (ref 3.5–5.1)
Sodium: 141 mmol/L (ref 135–145)
Total Bilirubin: 1 mg/dL (ref 0.3–1.2)
Total Protein: 6.8 g/dL (ref 6.5–8.1)

## 2021-06-27 LAB — CBC
HCT: 35.5 % — ABNORMAL LOW (ref 36.0–46.0)
Hemoglobin: 11.6 g/dL — ABNORMAL LOW (ref 12.0–15.0)
MCH: 27.4 pg (ref 26.0–34.0)
MCHC: 32.7 g/dL (ref 30.0–36.0)
MCV: 83.9 fL (ref 80.0–100.0)
Platelets: 218 10*3/uL (ref 150–400)
RBC: 4.23 MIL/uL (ref 3.87–5.11)
RDW: 13.5 % (ref 11.5–15.5)
WBC: 5.3 10*3/uL (ref 4.0–10.5)
nRBC: 0 % (ref 0.0–0.2)

## 2021-06-27 LAB — URINALYSIS, ROUTINE W REFLEX MICROSCOPIC
Bilirubin Urine: NEGATIVE
Glucose, UA: NEGATIVE mg/dL
Hgb urine dipstick: NEGATIVE
Ketones, ur: NEGATIVE mg/dL
Leukocytes,Ua: NEGATIVE
Nitrite: NEGATIVE
Protein, ur: 30 mg/dL — AB
Specific Gravity, Urine: 1.009 (ref 1.005–1.030)
pH: 7 (ref 5.0–8.0)

## 2021-06-27 LAB — LIPASE, BLOOD: Lipase: 19 U/L (ref 11–51)

## 2021-06-27 IMAGING — CT CT RENAL STONE PROTOCOL
2 of 4 series · 16 of 46 positions shown, 18 images · non-contrast
Comparison: CT examination dated [DATE]

CLINICAL DATA: Flank pain, kidney stone suspected.



[Series 2: axial st · axial · 0.77mm/px · z∈[+1008,+1368]mm · 13 of 81 slices shown, 15 images]
[im 5/81  soft-tissue]
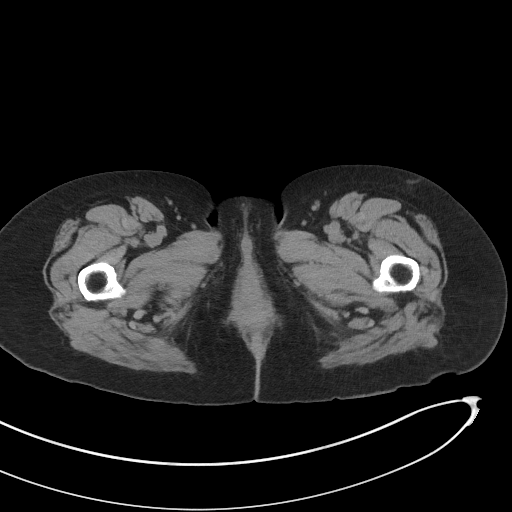
[im 5/81  bone]
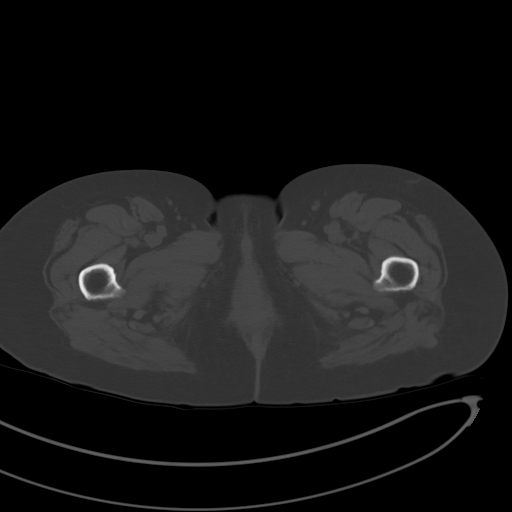
[im 13/81  soft-tissue]
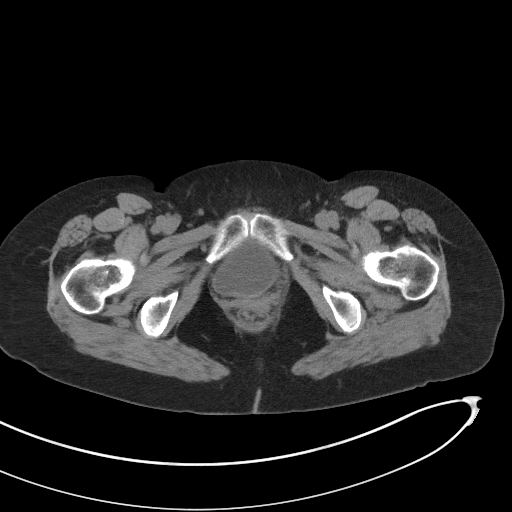
[im 17/81  soft-tissue]
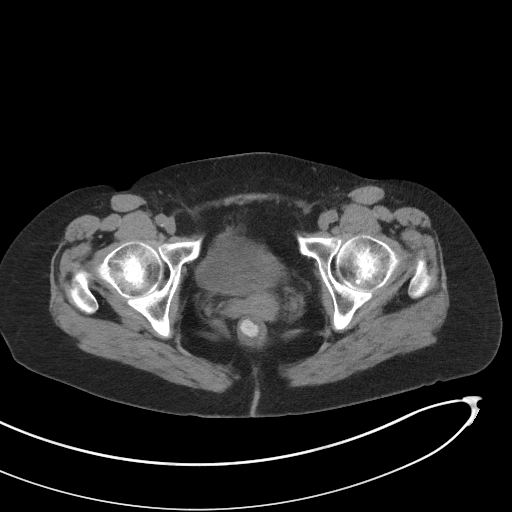
[im 25/81  soft-tissue]
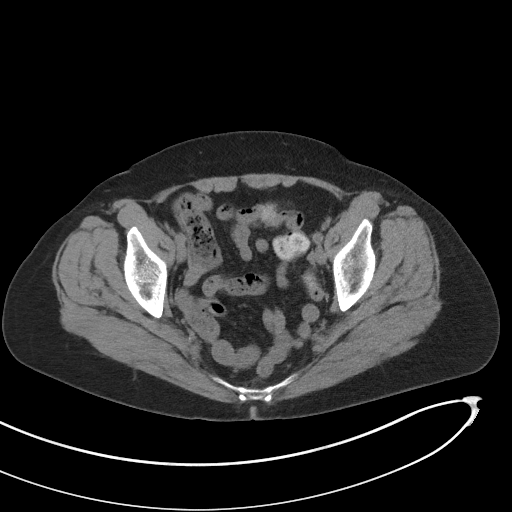
[im 29/81  soft-tissue]
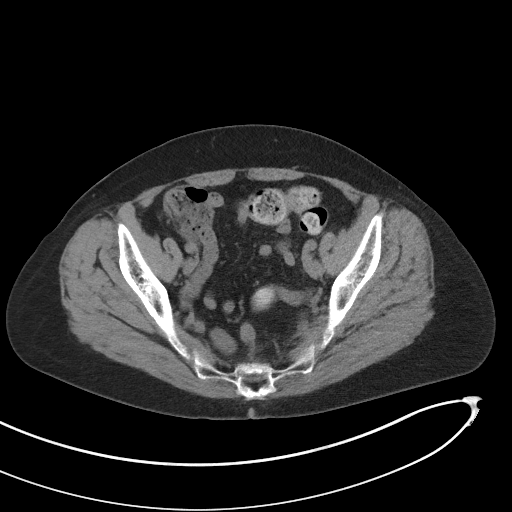
[im 37/81  soft-tissue]
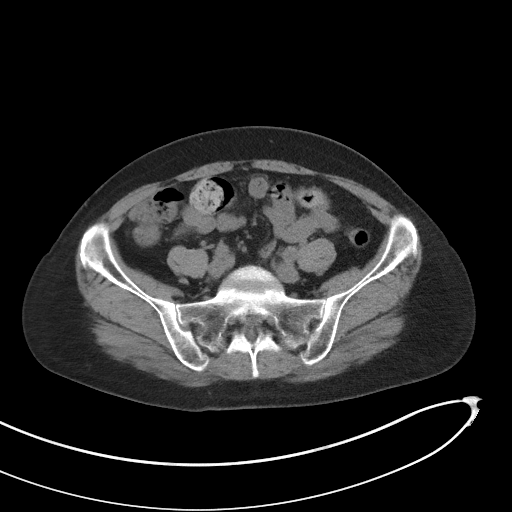
[im 41/81  soft-tissue]
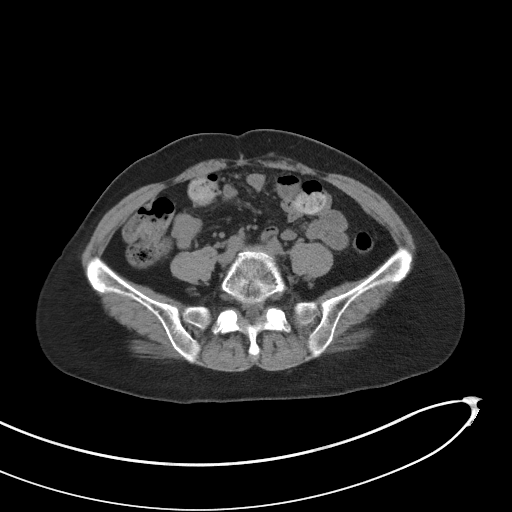
[im 45/81  soft-tissue]
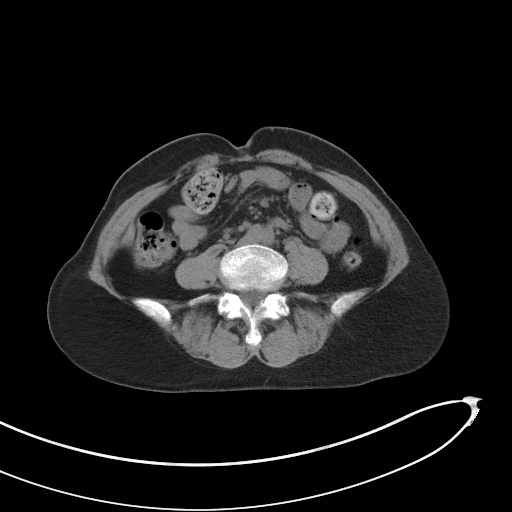
[im 53/81  soft-tissue]
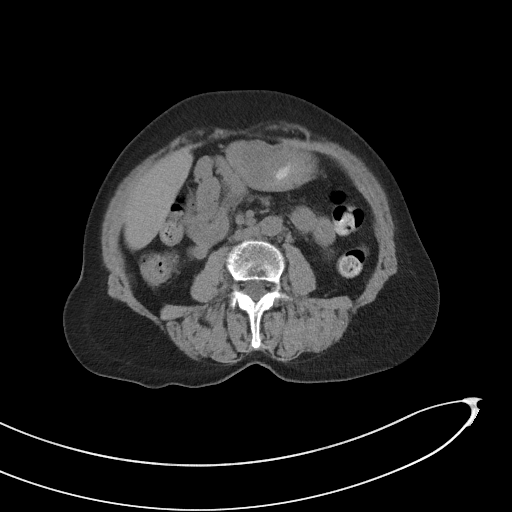
[im 53/81  bone]
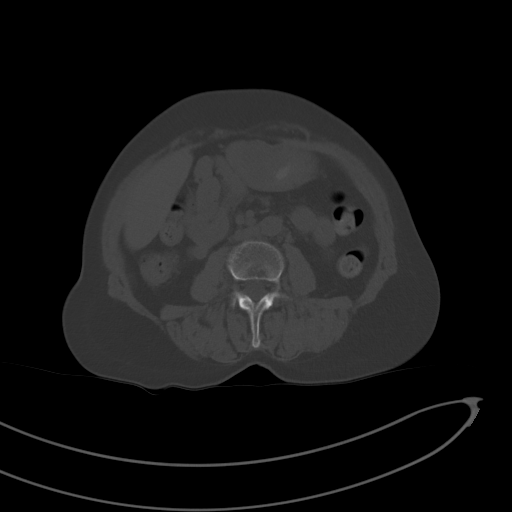
[im 57/81  soft-tissue]
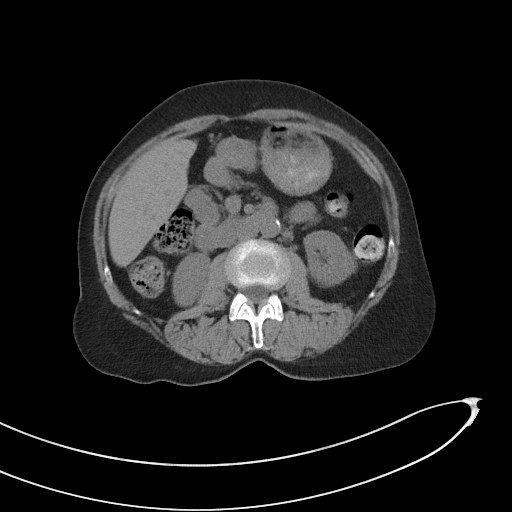
[im 65/81  soft-tissue]
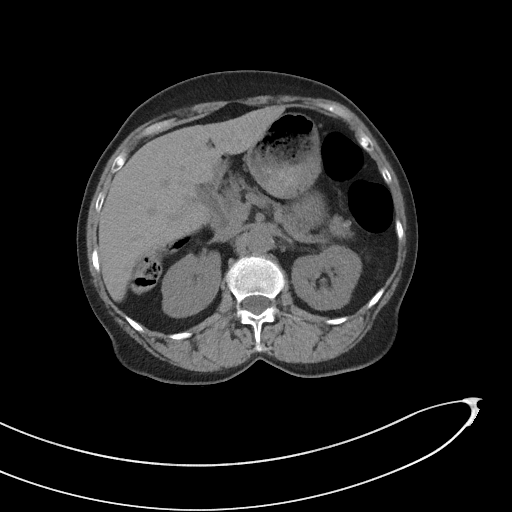
[im 69/81  soft-tissue]
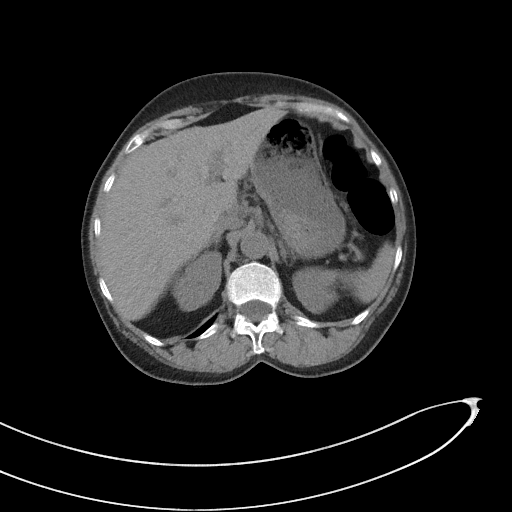
[im 77/81  soft-tissue]
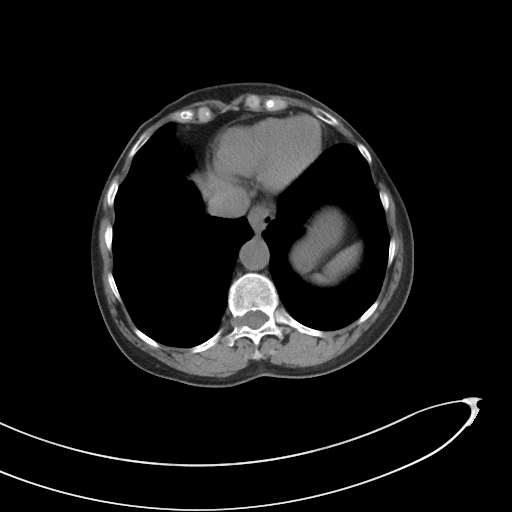

[Series 5: coronal st · coronal · 0.68mm/px · 3 of 95 slices shown]
[im 32/95  soft-tissue]
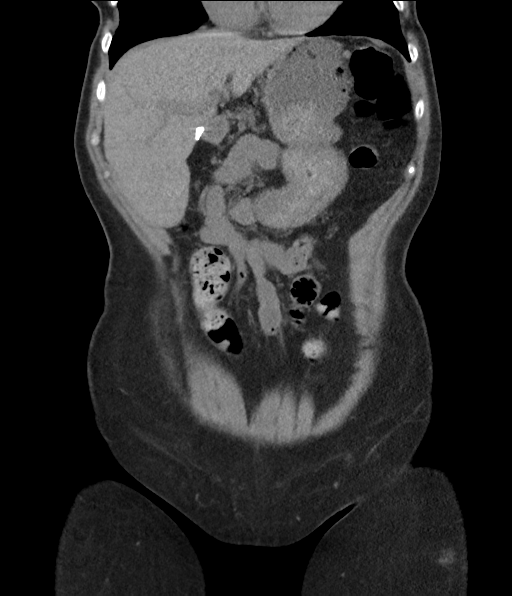
[im 42/95  soft-tissue]
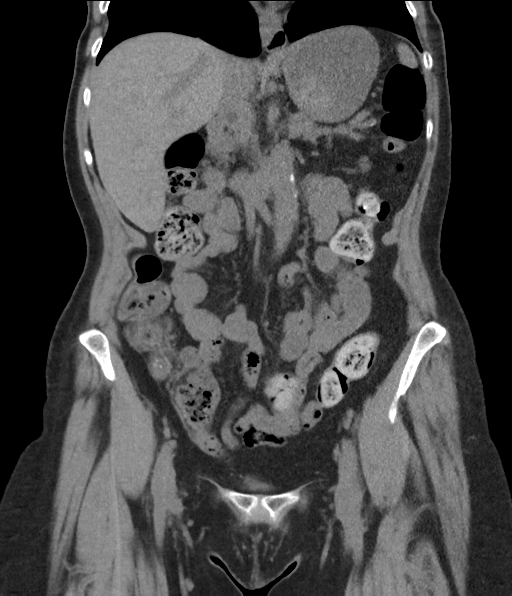
[im 53/95  soft-tissue]
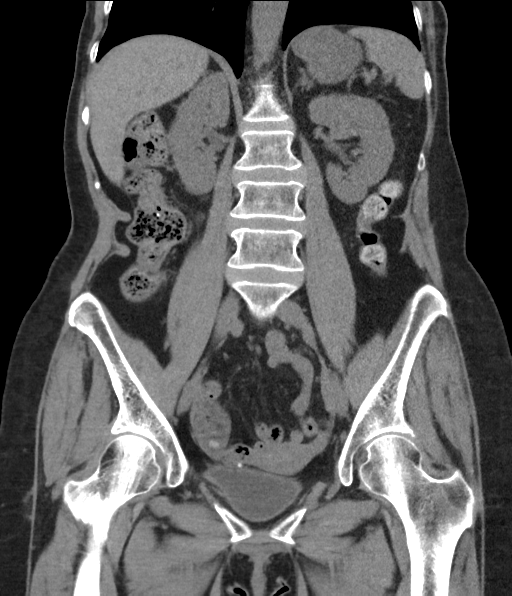

[16 of 46 positions shown; findings below may reference images not displayed]

FINDINGS: Lower chest: No acute abnormality.

Hepatobiliary: No focal liver abnormality is seen. Status post
cholecystectomy. No biliary dilatation.

Pancreas: Moderate pancreatic atrophy. No pancreatic ductal
dilatation or surrounding inflammatory changes.

Spleen: Normal in size without focal abnormality.

Adrenals/Urinary Tract: Adrenal glands are unremarkable. Kidneys are
normal, without renal calculi, focal lesion, or hydronephrosis.
Bladder is unremarkable.

Stomach/Bowel: Stomach is within normal limits. Appendix appears
normal. No evidence of bowel wall thickening, distention, or
inflammatory changes.

Vascular/Lymphatic: No significant vascular findings are present. No
enlarged abdominal or pelvic lymph nodes.

Reproductive: Small degenerated fibroid in the uterus. Bilateral
adnexa are unremarkable.

Other: No abdominal wall hernia or abnormality. No abdominopelvic
ascites.

Musculoskeletal: No acute or significant osseous findings.
IMPRESSION: 1.  No evidence of nephrolithiasis or hydronephrosis.

2. Bowel loops are normal in caliber. No evidence of colitis or
diverticulitis.

3.  Moderate pancreatic atrophy.

4.  No CT evidence of acute abdominal/pelvic process.

## 2021-06-27 MED ORDER — SODIUM CHLORIDE 0.9 % IV SOLN: INTRAVENOUS | Status: DC

## 2021-06-27 MED ORDER — POTASSIUM CHLORIDE CRYS ER 20 MEQ PO TBCR: 20.0000 meq | EXTENDED_RELEASE_TABLET | Freq: Every day | ORAL | 0 refills | Status: DC

## 2021-06-27 MED ORDER — POTASSIUM CHLORIDE CRYS ER 20 MEQ PO TBCR
40.0000 meq | EXTENDED_RELEASE_TABLET | Freq: Once | ORAL | Status: AC
Start: 2021-06-27 — End: 2021-06-27
  Administered 2021-06-27: 40 meq via ORAL
  Filled 2021-06-27: qty 2

## 2021-06-27 MED ORDER — FENTANYL CITRATE PF 50 MCG/ML IJ SOSY
50.0000 ug | PREFILLED_SYRINGE | Freq: Once | INTRAMUSCULAR | Status: AC
  Administered 2021-06-27: 50 ug via INTRAVENOUS
  Filled 2021-06-27: qty 1

## 2021-06-27 MED ORDER — ONDANSETRON HCL 4 MG/2ML IJ SOLN
4.0000 mg | Freq: Once | INTRAMUSCULAR | Status: AC
  Administered 2021-06-27: 4 mg via INTRAVENOUS
  Filled 2021-06-27: qty 2

## 2021-06-27 NOTE — ED Provider Notes (Signed)
[facility] EMERGENCY DEPARTMENT Provider Note   CSN: [id number] Arrival date & time: 06/27/21  1534     History  Chief Complaint  Patient presents with   Abdominal Pain   Back Pain    Kimberly Mccormick is a 71 y.o. female.  HPI Patient presenting for evaluation of ongoing back pain for 4 days, followed by abdominal pain which started today.  Is been no fever, chills, vomiting or dizziness.    Home Medications Prior to Admission medications   Medication Sig Start Date End Date Taking? Authorizing Provider  potassium chloride SA (KLOR-CON M) 20 MEQ tablet Take 1 tablet (20 mEq total) by mouth daily. 06/27/21  Yes Daleen Bo, MD  acyclovir (ZOVIRAX) 400 MG tablet Take 400 mg by mouth 2 (two) times daily.     [provider]  ALPRAZolam Duanne Moron) 0.5 MG tablet Take 0.5 mg by mouth daily as needed for anxiety. 01/12/21   [provider]  aspirin EC 81 MG EC tablet Take 1 tablet (81 mg total) by mouth daily. Swallow whole. 01/05/21   Geradine Girt, DO  cetirizine (ZYRTEC) 10 MG tablet Take 10 mg by mouth daily as needed for allergies.    [provider]  Cholecalciferol (VITAMIN D) 2000 UNITS tablet Take 2,000 Units by mouth daily.    [provider]  diclofenac Sodium (VOLTAREN) 1 % GEL Apply 2 g topically 4 (four) times daily. Patient taking differently: Apply 2 g topically daily as needed (pain). 01/05/21   Geradine Girt, DO  ferrous sulfate 325 (65 FE) MG tablet Take 325 mg by mouth daily with breakfast.    [provider]  HYDROcodone-acetaminophen (NORCO/VICODIN) 5-325 MG tablet Take 1 tablet by mouth every 6 (six) hours as needed for severe pain. 02/04/21   Ripley Fraise, MD  metoprolol succinate (TOPROL-XL) 25 MG 24 hr tablet Take 25 mg by mouth daily. 05/28/17   [provider]  omeprazole (PRILOSEC) 40 MG capsule Take 1 capsule (40 mg total) by mouth daily. Patient taking differently: Take 40 mg by mouth daily as needed  (Acid reflux). 11/27/20   Harvel Quale, MD  ondansetron (ZOFRAN-ODT) 4 MG disintegrating tablet Take 1 tablet (4 mg total) by mouth every 8 (eight) hours as needed for nausea or vomiting. 05/28/21   Harvel Quale, MD  pramipexole (MIRAPEX) 0.5 MG tablet Take 1 tablet (0.5 mg total) by mouth 3 (three) times daily. 12/07/20   Star Age, MD  PROCTO-MED [facility] 2.5 % rectal cream Place 1 application. rectally 2 (two) times daily as needed for hemorrhoids. 07/19/20   [provider]  rosuvastatin (CRESTOR) 20 MG tablet Take 1 tablet (20 mg total) by mouth daily. 04/17/21   Satira Sark, MD  ticagrelor (BRILINTA) 90 MG TABS tablet Take 1 tablet (90 mg total) by mouth 2 (two) times daily. 04/27/21   Monia Sabal, PA-C  zolpidem (AMBIEN) 10 MG tablet Take 5 mg by mouth at bedtime as needed for sleep.    [provider]      Allergies    Eggs or egg-derived products, Other, and Penicillins    Review of Systems   Review of Systems  Physical Exam Updated Vital Signs BP (!) 148/78   Pulse (!) 48   Temp 98 F (36.7 C)   Resp 18   Ht '5\' 4"'$  (1.626 m)   Wt 59 kg   SpO2 99%   BMI 22.31 kg/m  Physical Exam Vitals and nursing note  reviewed.  Constitutional:      General: She is not in acute distress.    Appearance: She is well-developed. She is not ill-appearing or diaphoretic.     Comments: Elderly, frail  HENT:     Head: Normocephalic and atraumatic.     Right Ear: External ear normal.     Left Ear: External ear normal.  Eyes:     Conjunctiva/sclera: Conjunctivae normal.     Pupils: Pupils are equal, round, and reactive to light.  Neck:     Trachea: Phonation normal.  Cardiovascular:     Rate and Rhythm: Normal rate and regular rhythm.     Heart sounds: Normal heart sounds.  Pulmonary:     Effort: Pulmonary effort is normal. No respiratory distress.     Breath sounds: Normal breath sounds. No stridor.  Abdominal:     General: There is no  distension.     Palpations: Abdomen is soft.     Tenderness: There is no abdominal tenderness.  Musculoskeletal:        General: Normal range of motion.     Cervical back: Normal range of motion and neck supple.  Skin:    General: Skin is warm and dry.  Neurological:     Mental Status: She is alert and oriented to person, place, and time.     Cranial Nerves: No cranial nerve deficit.     Sensory: No sensory deficit.     Motor: No abnormal muscle tone.     Coordination: Coordination normal.  Psychiatric:        Mood and Affect: Mood normal.        Behavior: Behavior normal.        Thought Content: Thought content normal.        Judgment: Judgment normal.     ED Results / Procedures / Treatments   Labs (all labs ordered are listed, but only abnormal results are displayed) Labs Reviewed  COMPREHENSIVE METABOLIC PANEL - Abnormal; Notable for the following components:      Result Value   Potassium 3.2 (*)    All other components within normal limits  CBC - Abnormal; Notable for the following components:   Hemoglobin 11.6 (*)    HCT 35.5 (*)    All other components within normal limits  URINALYSIS, ROUTINE W REFLEX MICROSCOPIC - Abnormal; Notable for the following components:   Protein, ur 30 (*)    Bacteria, UA RARE (*)    All other components within normal limits  LIPASE, BLOOD    EKG None  Radiology CT Renal Stone Study  Result Date: 06/27/2021 CLINICAL DATA:  Flank pain, kidney stone suspected. EXAM: CT ABDOMEN AND PELVIS WITHOUT CONTRAST TECHNIQUE: Multidetector CT imaging of the abdomen and pelvis was performed following the standard protocol without IV contrast. RADIATION DOSE REDUCTION: This exam was performed according to the departmental dose-optimization program which includes automated exposure control, adjustment of the mA and/or kV according to patient size and/or use of iterative reconstruction technique. COMPARISON:  CT examination dated February 04, 2021  FINDINGS: Lower chest: No acute abnormality. Hepatobiliary: No focal liver abnormality is seen. Status post cholecystectomy. No biliary dilatation. Pancreas: Moderate pancreatic atrophy. No pancreatic ductal dilatation or surrounding inflammatory changes. Spleen: Normal in size without focal abnormality. Adrenals/Urinary Tract: Adrenal glands are unremarkable. Kidneys are normal, without renal calculi, focal lesion, or hydronephrosis. Bladder is unremarkable. Stomach/Bowel: Stomach is within normal limits. Appendix appears normal. No evidence of bowel wall thickening, distention, or inflammatory changes.  Vascular/Lymphatic: No significant vascular findings are present. No enlarged abdominal or pelvic lymph nodes. Reproductive: Small degenerated fibroid in the uterus. Bilateral adnexa are unremarkable. Other: No abdominal wall hernia or abnormality. No abdominopelvic ascites. Musculoskeletal: No acute or significant osseous findings. IMPRESSION: 1.  No evidence of nephrolithiasis or hydronephrosis. 2. Bowel loops are normal in caliber. No evidence of colitis or diverticulitis. 3.  Moderate pancreatic atrophy. 4.  No CT evidence of acute abdominal/pelvic process. Electronically Signed   By: Keane Police D.O.   On: 06/27/2021 17:08    Procedures Procedures    Medications Ordered in ED Medications  fentaNYL (SUBLIMAZE) injection 50 mcg (50 mcg Intravenous Given 06/27/21 1704)  ondansetron (ZOFRAN) injection 4 mg (4 mg Intravenous Given 06/27/21 1704)  potassium chloride SA (KLOR-CON M) CR tablet 40 mEq (40 mEq Oral Given 06/27/21 1948)    ED Course/ Medical Decision Making/ A&P                           Medical Decision Making Patient presenting with nonspecific symptoms requiring evaluation for acute intra-abdominal abnormalities including kidney stone, colitis, bowel obstruction urinary tract infection.  Problems Addressed: Abdominal pain, unspecified abdominal location: acute illness or injury     Details: Reassuring findings on evaluation Back pain, unspecified back location, unspecified back pain laterality, unspecified chronicity: acute illness or injury    Details: Atraumatic pain, requiring evaluation. Hypokalemia: acute illness or injury    Details: Incidental finding not appear to be provoked.  Amount and/or Complexity of Data Reviewed Independent Historian:     Details: She is a cogent historian Labs: ordered.    Details: CBC, metabolic panel, urinalysis, lipase normal except potassium low Radiology: ordered and independent interpretation performed.    Details: CT abdomen pelvis-no acute intra-abdominal abnormality.  Evaluation of spine images, indicate very mild degenerative joint changes.  Risk Prescription drug management. Decision regarding hospitalization. Risk Details: Patient presenting with nonspecific symptoms and has reassuring evaluation.  No evidence for UTI, pyelonephritis, colitis, bowel obstruction, myelopathy or spinal radiculopathy.  She does not require further ED evaluation hospitalization.  Have recommended, for pain and PCP follow-up as needed.           Final Clinical Impression(s) / ED Diagnoses Final diagnoses:  Back pain, unspecified back location, unspecified back pain laterality, unspecified chronicity  Abdominal pain, unspecified abdominal location  Hypokalemia    Rx / DC Orders ED Discharge Orders          Ordered    potassium chloride SA (KLOR-CON M) 20 MEQ tablet  Daily        06/27/21 1931              Daleen Bo, MD 06/28/21 1236

## 2021-06-27 NOTE — ED Triage Notes (Signed)
Pt having abdominal pain and back pain x 1 day, history of IBS.

## 2021-06-27 NOTE — Discharge Instructions (Signed)
The testing did not show any severe problem with your back or abdomen.  Take Tylenol, 1000 mg, 4 times a day for pain.  We sent a prescription of potassium to your pharmacy.  Start taking it tomorrow.  Try to eat foods which contain potassium.  See your doctor for checkup next week.  Try using heating pad on the sore area 3-4 times a day.  Return here if needed

## 2021-06-27 NOTE — ED Notes (Signed)
Pt ambulated to the restroom at this time without incident. Bjorn Hallas

## 2021-06-29 ENCOUNTER — Ambulatory Visit: Payer: Medicare Other | Admitting: Hematology and Oncology

## 2021-07-03 DIAGNOSIS — Z853 Personal history of malignant neoplasm of breast: Secondary | ICD-10-CM | POA: Diagnosis not present

## 2021-07-03 DIAGNOSIS — E876 Hypokalemia: Secondary | ICD-10-CM | POA: Diagnosis not present

## 2021-07-03 DIAGNOSIS — R030 Elevated blood-pressure reading, without diagnosis of hypertension: Secondary | ICD-10-CM | POA: Diagnosis not present

## 2021-07-03 DIAGNOSIS — M545 Low back pain, unspecified: Secondary | ICD-10-CM | POA: Diagnosis not present

## 2021-07-03 DIAGNOSIS — Z6823 Body mass index (BMI) 23.0-23.9, adult: Secondary | ICD-10-CM | POA: Diagnosis not present

## 2021-07-03 DIAGNOSIS — M5136 Other intervertebral disc degeneration, lumbar region: Secondary | ICD-10-CM | POA: Diagnosis not present

## 2021-07-19 ENCOUNTER — Telehealth (HOSPITAL_COMMUNITY): Payer: Self-pay

## 2021-07-19 NOTE — Telephone Encounter (Signed)
Pt called to know when her next f/u is. She is due in August. I will call her once I get approval back from insurance. AW

## 2021-07-21 ENCOUNTER — Other Ambulatory Visit: Payer: Self-pay | Admitting: Nurse Practitioner

## 2021-07-25 ENCOUNTER — Ambulatory Visit: Payer: Medicare Other | Admitting: Neurology

## 2021-07-27 ENCOUNTER — Ambulatory Visit: Payer: Medicare Other | Admitting: Hematology and Oncology

## 2021-07-31 ENCOUNTER — Telehealth: Payer: Self-pay | Admitting: Hematology and Oncology

## 2021-07-31 NOTE — Telephone Encounter (Signed)
.  Called patient to schedule appointment per 7/10 inbasket, patient is aware of date and time.   

## 2021-08-01 ENCOUNTER — Other Ambulatory Visit (HOSPITAL_COMMUNITY): Payer: Self-pay | Admitting: Interventional Radiology

## 2021-08-01 DIAGNOSIS — I671 Cerebral aneurysm, nonruptured: Secondary | ICD-10-CM

## 2021-08-02 ENCOUNTER — Other Ambulatory Visit (HOSPITAL_COMMUNITY): Payer: Self-pay | Admitting: Interventional Radiology

## 2021-08-02 DIAGNOSIS — I671 Cerebral aneurysm, nonruptured: Secondary | ICD-10-CM

## 2021-08-04 ENCOUNTER — Encounter (HOSPITAL_COMMUNITY): Payer: Self-pay

## 2021-08-04 ENCOUNTER — Emergency Department (HOSPITAL_COMMUNITY): Payer: Medicare Other

## 2021-08-04 ENCOUNTER — Emergency Department (HOSPITAL_COMMUNITY)
Admission: EM | Admit: 2021-08-04 | Discharge: 2021-08-04 | Disposition: A | Discharge status: Home / Self Care | Payer: Medicare Other | Attending: Emergency Medicine | Admitting: Emergency Medicine

## 2021-08-04 ENCOUNTER — Other Ambulatory Visit: Payer: Self-pay

## 2021-08-04 DIAGNOSIS — Z7982 Long term (current) use of aspirin: Secondary | ICD-10-CM | POA: Diagnosis not present

## 2021-08-04 DIAGNOSIS — G20 Parkinson's disease: Secondary | ICD-10-CM | POA: Insufficient documentation

## 2021-08-04 DIAGNOSIS — M25552 Pain in left hip: Secondary | ICD-10-CM | POA: Insufficient documentation

## 2021-08-04 DIAGNOSIS — Z79899 Other long term (current) drug therapy: Secondary | ICD-10-CM | POA: Diagnosis not present

## 2021-08-04 MED ORDER — HYDROCODONE-ACETAMINOPHEN 5-325 MG PO TABS: 1.0000 | ORAL_TABLET | Freq: Four times a day (QID) | ORAL | 0 refills | Status: DC | PRN

## 2021-08-04 NOTE — Discharge Instructions (Signed)
Follow-up with your family doctor in 1 to 2 weeks for recheck 

## 2021-08-04 NOTE — ED Triage Notes (Signed)
Patient states all week her lift hip has been sore while walking, denies injury or falling

## 2021-08-04 NOTE — ED Provider Notes (Signed)
[facility] EMERGENCY DEPARTMENT Provider Note   CSN: [id number] Arrival date & time: 08/04/21  1015     History {Add pertinent medical, surgical, social history, OB history to HPI:1} Chief Complaint  Patient presents with   Hip Pain    Patient states all week her lift hip has been sore while walking, denies injury or falling    Kimberly Mccormick is a 71 y.o. female.  Patient has a history of hypertension, GERD and Parkinson's disease.  Patient complains of left hip   Hip Pain       Home Medications Prior to Admission medications   Medication Sig Start Date End Date Taking? Authorizing Provider  HYDROcodone-acetaminophen (NORCO/VICODIN) 5-325 MG tablet Take 1 tablet by mouth every 6 (six) hours as needed. 08/04/21  Yes Milton Ferguson, MD  acyclovir (ZOVIRAX) 400 MG tablet Take 400 mg by mouth 2 (two) times daily.     [provider]  ALPRAZolam Duanne Moron) 0.5 MG tablet Take 0.5 mg by mouth daily as needed for anxiety. 01/12/21   [provider]  aspirin EC 81 MG EC tablet Take 1 tablet (81 mg total) by mouth daily. Swallow whole. 01/05/21   Geradine Girt, DO  cetirizine (ZYRTEC) 10 MG tablet Take 10 mg by mouth daily as needed for allergies.    [provider]  Cholecalciferol (VITAMIN D) 2000 UNITS tablet Take 2,000 Units by mouth daily.    [provider]  diclofenac Sodium (VOLTAREN) 1 % GEL Apply 2 g topically 4 (four) times daily. Patient taking differently: Apply 2 g topically daily as needed (pain). 01/05/21   Geradine Girt, DO  ferrous sulfate 325 (65 FE) MG tablet Take 325 mg by mouth daily with breakfast.    [provider]  metoprolol succinate (TOPROL-XL) 25 MG 24 hr tablet Take 25 mg by mouth daily. 05/28/17   [provider]  omeprazole (PRILOSEC) 40 MG capsule Take 1 capsule (40 mg total) by mouth daily. Patient taking differently: Take 40 mg by mouth daily as needed (Acid reflux). 11/27/20   Harvel Quale, MD  ondansetron (ZOFRAN-ODT) 4 MG disintegrating tablet Take 1 tablet (4 mg total) by mouth every 8 (eight) hours as needed for nausea or vomiting. 05/28/21   Harvel Quale, MD  potassium chloride SA (KLOR-CON M) 20 MEQ tablet Take 1 tablet (20 mEq total) by mouth daily. 06/27/21   Daleen Bo, MD  pramipexole (MIRAPEX) 0.5 MG tablet Take 1 tablet (0.5 mg total) by mouth 3 (three) times daily. 12/07/20   Star Age, MD  PROCTO-MED [facility] 2.5 % rectal cream Place 1 application. rectally 2 (two) times daily as needed for hemorrhoids. 07/19/20   [provider]  rosuvastatin (CRESTOR) 20 MG tablet Take 1 tablet (20 mg total) by mouth daily. 04/17/21   Satira Sark, MD  ticagrelor (BRILINTA) 90 MG TABS tablet Take 1 tablet (90 mg total) by mouth 2 (two) times daily. 04/27/21   Monia Sabal, PA-C  zolpidem (AMBIEN) 10 MG tablet Take 5 mg by mouth at bedtime as needed for sleep.    [provider]      Allergies    Eggs or egg-derived products, Other, and Penicillins    Review of Systems   Review of Systems  Physical Exam Updated Vital Signs BP (!) 157/92   Pulse 60   Temp 97.8 F (36.6 C)   Resp 17   Ht '5\' 3"'$  (1.6 m)   Wt 58.1 kg  SpO2 100%   BMI 22.67 kg/m  Physical Exam  ED Results / Procedures / Treatments   Labs (all labs ordered are listed, but only abnormal results are displayed) Labs Reviewed - No data to display  EKG None  Radiology DG Hip Unilat W or Wo Pelvis 2-3 Views Left  Result Date: 08/04/2021 CLINICAL DATA:  Left hip pain while walking.  No reported injury. EXAM: DG HIP (WITH OR WITHOUT PELVIS) 2-3V LEFT COMPARISON:  None Available. FINDINGS: No pelvic fracture or diastasis. No left hip fracture or dislocation. Mild superior left hip osteoarthritis. No suspicious focal osseous lesions. No radiopaque foreign bodies. IMPRESSION: No fracture. No left hip malalignment. Mild superior left hip osteoarthritis. Electronically Signed    By: Ilona Sorrel M.D.   On: 08/04/2021 11:50    Procedures Procedures  {Document cardiac monitor, telemetry assessment procedure when appropriate:1}  Medications Ordered in ED Medications - No data to display  ED Course/ Medical Decision Making/ A&P                           Medical Decision Making Amount and/or Complexity of Data Reviewed Radiology: ordered.  Risk Prescription drug management.   Patient with osteoarthritis of left hip.  She is given hydrocodone and will follow-up with her PCP  {Document critical care time when appropriate:1} {Document review of labs and clinical decision tools ie heart score, Chads2Vasc2 etc:1}  {Document your independent review of radiology images, and any outside records:1} {Document your discussion with family members, caretakers, and with consultants:1} {Document social determinants of health affecting pt's care:1} {Document your decision making why or why not admission, treatments were needed:1} Final Clinical Impression(s) / ED Diagnoses Final diagnoses:  Left hip pain    Rx / DC Orders ED Discharge Orders          Ordered    HYDROcodone-acetaminophen (NORCO/VICODIN) 5-325 MG tablet  Every 6 hours PRN        08/04/21 1322

## 2021-08-10 ENCOUNTER — Encounter (INDEPENDENT_AMBULATORY_CARE_PROVIDER_SITE_OTHER): Payer: Self-pay | Admitting: *Deleted

## 2021-08-13 ENCOUNTER — Ambulatory Visit (HOSPITAL_COMMUNITY): Admission: RE | Admit: 2021-08-13 | Payer: Medicare Other | Source: Ambulatory Visit

## 2021-08-13 ENCOUNTER — Encounter (HOSPITAL_COMMUNITY): Payer: Self-pay

## 2021-08-13 ENCOUNTER — Telehealth: Payer: Self-pay | Admitting: Hematology and Oncology

## 2021-08-13 NOTE — Telephone Encounter (Signed)
Cancelled appointment per 7/24 scheduling message/per patients request. Patient states she tested postive for covid and will call back to make the appointment when she is feeling better.

## 2021-08-14 ENCOUNTER — Ambulatory Visit (HOSPITAL_COMMUNITY): Admission: RE | Admit: 2021-08-14 | Payer: Medicare Other | Source: Ambulatory Visit

## 2021-08-17 ENCOUNTER — Ambulatory Visit: Payer: Medicare Other | Admitting: Hematology and Oncology

## 2021-08-28 DIAGNOSIS — R030 Elevated blood-pressure reading, without diagnosis of hypertension: Secondary | ICD-10-CM | POA: Diagnosis not present

## 2021-08-28 DIAGNOSIS — Z6823 Body mass index (BMI) 23.0-23.9, adult: Secondary | ICD-10-CM | POA: Diagnosis not present

## 2021-08-28 DIAGNOSIS — M1612 Unilateral primary osteoarthritis, left hip: Secondary | ICD-10-CM | POA: Diagnosis not present

## 2021-08-28 DIAGNOSIS — M546 Pain in thoracic spine: Secondary | ICD-10-CM | POA: Diagnosis not present

## 2021-08-28 DIAGNOSIS — M5136 Other intervertebral disc degeneration, lumbar region: Secondary | ICD-10-CM | POA: Diagnosis not present

## 2021-09-10 ENCOUNTER — Telehealth: Payer: Self-pay | Admitting: Hematology and Oncology

## 2021-09-10 NOTE — Telephone Encounter (Signed)
Per 8/21 phone line pt called to schedule 1 yr f/u

## 2021-09-13 ENCOUNTER — Telehealth (HOSPITAL_COMMUNITY): Payer: Self-pay

## 2021-09-13 NOTE — Telephone Encounter (Signed)
Called pt to give appt info, no answer, left vm. AW

## 2021-09-19 ENCOUNTER — Ambulatory Visit (INDEPENDENT_AMBULATORY_CARE_PROVIDER_SITE_OTHER): Payer: Medicare Other | Admitting: Neurology

## 2021-09-19 ENCOUNTER — Encounter: Payer: Self-pay | Admitting: Neurology

## 2021-09-19 VITALS — BP 131/81 | HR 63 | Ht 63.0 in | Wt 129.4 lb

## 2021-09-19 DIAGNOSIS — G20 Parkinson's disease: Secondary | ICD-10-CM | POA: Diagnosis not present

## 2021-09-19 DIAGNOSIS — R4789 Other speech disturbances: Secondary | ICD-10-CM

## 2021-09-19 DIAGNOSIS — I671 Cerebral aneurysm, nonruptured: Secondary | ICD-10-CM | POA: Diagnosis not present

## 2021-09-19 DIAGNOSIS — Z8673 Personal history of transient ischemic attack (TIA), and cerebral infarction without residual deficits: Secondary | ICD-10-CM | POA: Diagnosis not present

## 2021-09-19 MED ORDER — PRAMIPEXOLE DIHYDROCHLORIDE 0.5 MG PO TABS: 0.5000 mg | ORAL_TABLET | Freq: Three times a day (TID) | ORAL | 3 refills | Status: DC

## 2021-09-19 NOTE — Patient Instructions (Addendum)
It was nice to see you again today.  We will keep you on the pramipexole 0.5 mg 3 times daily.    Please follow-up routinely to see one of our nurse practitioners in 6 months, sooner if needed.

## 2021-09-19 NOTE — Progress Notes (Signed)
Subjective:    Patient ID: Kimberly Mccormick is a 71 y.o. female.  HPI    Interim history:    Kimberly Mccormick is a 71 year old right-handed woman with an underlying medical history of hypertension, hyperlipidemia, reflux disease, anemia, degenerative lumbar disc disease, breast cancer with status post lumpectomy in 2020, chemotherapy, XRT, history of neuropathy, and insomnia, who presents for follow up consultation of Kimberly Mccormick parkinsonism. I last saw Kimberly Mccormick on 03/14/2021, at which time she felt better, we talked about Kimberly Mccormick stroke admission.  She was stable on Kimberly Mccormick pramipexole.  Given the finding of Kimberly Mccormick brain aneurysm I referred Kimberly Mccormick to Dr. Estanislado Pandy.   She had an interim consultation and intervention with therapeutic thrombosis of Kimberly Mccormick aneurysm. She had endovascular treatment with flow diverter device on 04/25/2021.  She is supposed to have a brain MRI and MR angiogram of the head next month.  Postprocedure, she did develop right hand and forearm swelling which improved prior to Kimberly Mccormick discharge on 04/27/2021.  Today, 09/19/2021: She reports feeling stable.  She has noticed easy bruising but she is on aspirin and Brilinta.  She has intermittent headaches, nothing worse from before, may take Tylenol as needed, not every time.  She has had no falls, intermittent constipation for which she takes Colace as needed.  She tries to stay active, she rides a bike when she can and also walks in the neighborhood.  She tries to hydrate well but admits that she needs to drink more water.  She likes to drink some soda, 1 or 2 servings per day and every other day she may have ice tea.  She estimates that she drinks about 2 bottles of water per day.  She tolerates the pramipexole, takes the first dose at 9 AM, second dose 3 PM and third dose at 9 PM.  When she gets nervous, Kimberly Mccormick tremor flares up particularly in the left hand.  Speech is softer, denies any swallowing difficulty.  Sometimes people have a hard time understanding Kimberly Mccormick.  She has some  word finding difficulty at times.  The patient's allergies, current medications, family history, past medical history, past social history, past surgical history and problem list were reviewed and updated as appropriate.    Previously:    I saw Kimberly Mccormick on 12/07/2020, at which time she reported doing fairly well on pramipexole, she had noted benefit for Kimberly Mccormick tremor.   She called in the interim on 01/02/2021 reporting new onset leg weakness.  She was advised to proceed to the emergency room.  She presented to the emergency room on 01/03/2021 and was admitted to the hospital, work-up revealed a small acute infarct of the right corona radiata.  She was started on aspirin and statin.  She was given an event monitor.   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  Kimberly Mccormick brain MRI without contrast on 01/03/2021 showed: IMPRESSION: Small acute infarct of the right corona radiata. Mild chronic microvascular ischemic changes. Chronic small vessel infarcts.   Echocardiogram on 01/04/2021 showed: EF of 55-60, no regional wall motion abnormalities, normal ventricular function bilaterally.     Carotid Doppler ultrasound from 01/04/2021 showed: IMPRESSION: No significant stenosis of internal carotid arteries.   Cardiac event monitor for 30 days showed sinus rhythm with rare PVCs with ventricular bigeminy.  A few brief bursts of NSVT were noted.  No sustained ventricular events and no atrial arrhythmias, particularly no atrial fibrillation was seen.  I saw Kimberly Mccormick on 03/01/20, at which time we talked about Kimberly Mccormick recent Naomi.  She was encouraged to start a dopamine agonist and we mutually agreed to start Kimberly Mccormick on pramipexole low-dose with gradual titration.     I first met Kimberly Mccormick on 12/20/2019 at the request of Kimberly Mccormick primary care physician, at which time she reported a several year history of intermittent left hand tremors.  Examination showed evidence of mild left-sided  parkinsonism.  We talked about potentially utilizing symptomatic treatments in the near future and she was encouraged to pursue a DaTscan.  She was agreeable.   She had an interim DaTscan on 02/23/2020 and I reviewed the results:  IMPRESSION: Significant decreased striatal Ioflupane activity most prominent in the posterior striatum. This pattern can be seen in Parkinsonian syndromes. Of note, DaTSCAN is not diagnostic of Parkinsonian syndromes, which remains a clinical diagnosis. DaTscan is an adjuvant test to aid in the clinical diagnosis of Parkinsonian syndromes.   We called Kimberly Mccormick with Kimberly Mccormick test results and arrange for a follow-up appointment.   12/20/19: (She) reports a 2 to 68-monthhistory of intermittent left hand tremors. Per son, Kimberly Mccormick tremor may have been ongoing for about 5 to 6 months as per his recollection. She notices the tremor while sitting at rest and also when writing with Kimberly Mccormick right hand, Kimberly Mccormick tremor exacerbates on the left side. She does not have much in the way of right hand tremor and is not particularly bothered by the tremor. She denies any balance issues or falls. Later on in our discussion she does admit to other symptoms including some posture changes with leaning over more than usual and she has noticed that Kimberly Mccormick voice becomes weaker or softer as the day progresses. She has a paternal aunt who had Parkinson's disease. Both parents lived to be 830years old. She lives with Kimberly Mccormick son, she has another son. She is separated. She retired from [employer] and also worked in childcare with the [employer] She is a non-smoker and does not utilize alcohol and does not drink caffeine on a daily basis. She tries to hydrate well but could do better by self-report. She tries to exercise, uses a bike for exercise on a fairly regular basis. I reviewed your office note from 10/06/2019.     Kimberly Mccormick Past Medical History Is Significant For: Past Medical History:  Diagnosis Date   Aneurysm of  left internal carotid artery    Small supraclinoid 5 mm   [place]    Left s/p lumpectomy (05/06/2018) and adjuvant chemotherapy (06/23/2018) with Adriamycin and Cytoxan x4 followed by Taxol weekly x12   DDD (degenerative disc disease), lumbar    Essential hypertension    Genital warts    GERD (gastroesophageal reflux disease)    History of anemia    History of renal insufficiency    Hypertension    Insomnia    Iron deficiency anemia    Ischemic stroke ([facility]    December 2022   Mixed hyperlipidemia    Osteoarthritis    Parkinson's disease ([place]    Peripheral neuropathy    Related to chemotherapy    Kimberly Mccormick Past Surgical History Is Significant For: Past Surgical History:  Procedure Laterality Date   APPENDECTOMY  1966   BIOPSY  08/29/2020   Procedure: BIOPSY;  Surgeon: CHarvel Quale MD;  [facility];  Service: Gastroenterology;;   BREAST LUMPECTOMY WITH RADIOACTIVE SEED AND SENTINEL LYMPH NODE BIOPSY Left 05/06/2018   Procedure:  LEFT BREAST LUMPECTOMY WITH RADIOACTIVE SEED AND LEFT DEEP AXILLARY SENTINEL LYMPH NODE BIOPSY AND BLUE DYE INJECTION;  Surgeon: Fanny Skates, MD;  Location: [place];  Service: General;  Laterality: Left;   CATARACT EXTRACTION W/PHACO Left 01/10/2014   Procedure: CATARACT EXTRACTION PHACO AND INTRAOCULAR LENS PLACEMENT ; CDE:  4.94;  Surgeon: Williams Che, MD;  [facility];  Service: Ophthalmology;  Laterality: Left;   CESAREAN SECTION  1986   CHOLECYSTECTOMY     COLONOSCOPY WITH PROPOFOL N/A 07/25/2020   Procedure: COLONOSCOPY WITH PROPOFOL;  Surgeon: Harvel Quale, MD;  [facility];  Service: Gastroenterology;  Laterality: N/A;  10:55   ESOPHAGOGASTRODUODENOSCOPY (EGD) WITH PROPOFOL N/A 08/29/2020   Procedure: ESOPHAGOGASTRODUODENOSCOPY (EGD) WITH PROPOFOL;  Surgeon: Harvel Quale, MD;  [facility];  Service: Gastroenterology;  Laterality: N/A;  12:00   IR  3D INDEPENDENT WKST  04/25/2021   IR ANGIO INTRA EXTRACRAN SEL COM CAROTID INNOMINATE UNI R MOD SED  04/25/2021   IR ANGIO INTRA EXTRACRAN SEL INTERNAL CAROTID UNI L MOD SED  04/25/2021   IR ANGIOGRAM FOLLOW UP STUDY  04/25/2021   IR CT HEAD LTD  04/25/2021   IR NEURO EACH ADD'L AFTER BASIC UNI LEFT (MS)  04/25/2021   IR RADIOLOGIST EVAL & MGMT  03/22/2021   IR RADIOLOGIST EVAL & MGMT  03/28/2021   IR RADIOLOGIST EVAL & MGMT  05/15/2021   IR TRANSCATH/EMBOLIZ  04/25/2021   IR US GUIDE VASC ACCESS RIGHT  04/25/2021   MYRINGOTOMY WITH TUBE PLACEMENT Right 02/04/2017   Procedure: REVISION OF RIGHT MYRINGOTOMY WITH TUBE PLACEMENT, WITH EXAM OF LEFT EAR;  Surgeon: Leta Baptist, MD;  Location: [place];  Service: ENT;  Laterality: Right;   NASAL SINUS SURGERY  2016   polypectomy   POLYPECTOMY  07/25/2020   Procedure: POLYPECTOMY;  Surgeon: Harvel Quale, MD;  [facility];  Service: Gastroenterology;;   POLYPECTOMY  08/29/2020   Procedure: POLYPECTOMY;  Surgeon: Harvel Quale, MD;  [facility];  Service: Gastroenterology;;   [facility] REMOVAL N/A 01/06/2019   Procedure: REMOVAL PORT-A-CATH;  Surgeon: Fanny Skates, MD;  Location: [place];  Service: General;  Laterality: N/A;   PORTACATH PLACEMENT Right 06/16/2018   Procedure: INSERTION PORT-A-CATH;  Surgeon: Fanny Skates, MD;  Location: [place];  Service: General;  Laterality: Right;   RADIOLOGY WITH ANESTHESIA N/A 04/25/2021   Procedure: IR WITH ANESTHESIA EMBOLIZATION;  Surgeon: Luanne Bras, MD;  Location: [place];  Service: Radiology;  Laterality: N/A;   SUBMUCOSAL LIFTING INJECTION  08/29/2020   Procedure: SUBMUCOSAL LIFTING INJECTION;  Surgeon: Montez Morita, Quillian Quince, MD;  [facility];  Service: Gastroenterology;;   TONSILLECTOMY  1970    Kimberly Mccormick Family History Is Significant For: Family History  Problem Relation Age of Onset   Lupus Mother     Heart attack Father        age 63   Prostate cancer Father    Hypothyroidism Sister    Breast cancer Sister    Hypertension Sister    Breast cancer Sister    Hypertension Brother    Parkinson's disease Paternal Aunt    Stroke Neg Hx     Kimberly Mccormick Social History Is Significant For: Social History   Socioeconomic History   Marital status: Legally Separated    Spouse name: Not on file   Number of children: 2   Years of education: Not on  file   Highest education level: Not on file  Occupational History    Comment: retired  Tobacco Use   Smoking status: Never   Smokeless tobacco: Never  Vaping Use   Vaping Use: Never used  Substance and Sexual Activity   Alcohol use: No   Drug use: No   Sexual activity: Not Currently    Birth control/protection: Post-menopausal  Other Topics Concern   Not on file  Social History Narrative   One son lives with Kimberly Mccormick   Social Determinants of Health   Financial Resource Strain: Low Risk  (01/08/2021)   Overall Financial Resource Strain (CARDIA)    Difficulty of Paying Living Expenses: Not hard at all  Food Insecurity: No Food Insecurity (01/08/2021)   Hunger Vital Sign    Worried About Running Out of Food in the Last Year: Never true    Ran Out of Food in the Last Year: Never true  Transportation Needs: No Transportation Needs (01/08/2021)   PRAPARE - Hydrologist (Medical): No    Lack of Transportation (Non-Medical): No  Physical Activity: Inactive (01/08/2021)   Exercise Vital Sign    Days of Exercise per Week: 0 days    Minutes of Exercise per Session: 0 min  Stress: No Stress Concern Present (01/08/2021)   [place]    Feeling of Stress : Not at all  Social Connections: Moderately Isolated (01/08/2021)   Social Connection and Isolation Panel [NHANES]    Frequency of Communication with Friends and Family: More than three times a week     Frequency of Social Gatherings with Friends and Family: More than three times a week    Attends Religious Services: More than 4 times per year    Active Member of [employer] or Organizations: No    Attends Music therapist: Never    Marital Status: Separated    Kimberly Mccormick Allergies Are:  Allergies  Allergen Reactions   Eggs Or Egg-Derived Products Nausea And Vomiting   Other Nausea And Vomiting    Dairy products\    Penicillins Hives    Has patient had a PCN reaction causing immediate rash, facial/tongue/throat swelling, SOB or lightheadedness with hypotension: No Has patient had a PCN reaction causing severe rash involving mucus membranes or skin necrosis: No Has patient had a PCN reaction that required hospitalization: No Has patient had a PCN reaction occurring within the last 10 years: No If all of the above answers are "NO", then may proceed with Cephalosporin use.   :   Kimberly Mccormick Current Medications Are:  Outpatient Encounter Medications as of 09/19/2021  Medication Sig   acyclovir (ZOVIRAX) 400 MG tablet Take 400 mg by mouth 2 (two) times daily.    ALPRAZolam (XANAX) 0.5 MG tablet Take 0.5 mg by mouth daily as needed for anxiety.   aspirin EC 81 MG EC tablet Take 1 tablet (81 mg total) by mouth daily. Swallow whole.   cetirizine (ZYRTEC) 10 MG tablet Take 10 mg by mouth daily as needed for allergies.   Cholecalciferol (VITAMIN D) 2000 UNITS tablet Take 2,000 Units by mouth daily.   diclofenac Sodium (VOLTAREN) 1 % GEL Apply 2 g topically 4 (four) times daily. (Patient taking differently: Apply 2 g topically daily as needed (pain).)   ferrous sulfate 325 (65 FE) MG tablet Take 325 mg by mouth daily with breakfast.   metoprolol succinate (TOPROL-XL) 25 MG 24 hr tablet Take 25 mg  by mouth daily.   omeprazole (PRILOSEC) 40 MG capsule Take 1 capsule (40 mg total) by mouth daily. (Patient taking differently: Take 40 mg by mouth daily as needed (Acid reflux).)   pramipexole (MIRAPEX) 0.5  MG tablet Take 1 tablet (0.5 mg total) by mouth 3 (three) times daily.   PROCTO-MED HC 2.5 % rectal cream Place 1 application. rectally 2 (two) times daily as needed for hemorrhoids.   rosuvastatin (CRESTOR) 20 MG tablet Take 1 tablet (20 mg total) by mouth daily.   ticagrelor (BRILINTA) 90 MG TABS tablet Take 1 tablet (90 mg total) by mouth 2 (two) times daily.   zolpidem (AMBIEN) 10 MG tablet Take 5 mg by mouth at bedtime as needed for sleep.   [DISCONTINUED] HYDROcodone-acetaminophen (NORCO/VICODIN) 5-325 MG tablet Take 1 tablet by mouth every 6 (six) hours as needed.   [DISCONTINUED] ondansetron (ZOFRAN-ODT) 4 MG disintegrating tablet Take 1 tablet (4 mg total) by mouth every 8 (eight) hours as needed for nausea or vomiting.   [DISCONTINUED] potassium chloride SA (KLOR-CON M) 20 MEQ tablet Take 1 tablet (20 mEq total) by mouth daily.   No facility-administered encounter medications on file as of 09/19/2021.  :  Review of Systems:  Out of a complete 14 point review of systems, all are reviewed and negative with the exception of these symptoms as listed below:  Review of Systems  Neurological:        Pt is doing ok,  pramipexole is working. Notices when anxious will tremor l hand.      Objective:  Neurological Exam  Physical Exam Physical Examination:   Vitals:   09/19/21 1029  BP: 131/81  Pulse: 63    General Examination: The patient is a very pleasant 71 y.o. female in no acute distress. She appears well-developed and well-nourished and well groomed.   HEENT: Normocephalic, atraumatic, pupils are equal, round and reactive to light. Status post bilateral cataract repairs. Extraocular tracking is fairly well preserved, hearing is mildly impaired. She has bilateral hearing aids. Airway examination reveals mild mouth dryness, dentures in place, tongue protrudes centrally and palate elevates symmetrically. She has no lip, neck or jaw tremor, mild nuchal rigidity is noted, mild to  moderate facial masking is noted, stable. Normal facial sensation to light touch. She has mild hypophonia, no dysarthria, no voice tremor noted.   Chest: Clear to auscultation without wheezing, rhonchi or crackles noted.   Heart: S1+S2+0, regular and normal without murmurs, rubs or gallops noted.    Abdomen: Soft, non-tender and non-distended.   Extremities: There is no pitting edema in the distal lower extremities bilaterally.   Skin: Warm and dry without trophic changes noted.   Musculoskeletal: exam reveals no obvious joint deformities.    Neurologically:  Mental status: The patient is awake, alert and oriented in all 4 spheres. Kimberly Mccormick immediate and remote memory, attention, language skills and fund of knowledge are appropriate. There is no evidence of aphasia, agnosia, apraxia or anomia. Speech with mild hypophonia.  No dysarthria.  Thought process is linear. Mood is normal and affect is normal.  Cranial nerves II - XII are as described above under HEENT exam. In addition: shoulder shrug is normal with equal shoulder height noted. Motor exam: Normal bulk, global strength of 5 out of 5, she has a mild resting tremor in the left upper extremity only, no other resting tremor noted.  Mild increase in tone in the left wrist.   (On 12/20/2019: On Archimedes spiral drawing she has known  difficulty with either hand, handwriting with Kimberly Mccormick right hand, which is Kimberly Mccormick dominant hand is legible, not tremulous, not particularly micrographic.)   She has a minimal postural tremor in the left upper extremity, no significant action tremor in either hand, no intention tremor. She has no lower extremity tremor. Fine motor skills and coordination: She has mild to moderate difficulty with finger taps on the left side, minimal difficulty or fairly normal in the right upper extremity. Foot taps and foot agility are fairly normal on the right, mildly impaired on the left. Hand movements and rapid alternating patting are  slightly difficult with the left only. She has overall mild bradykinesia.   Cerebellar testing: No dysmetria or intention tremor on finger to nose testing. Heel to shin is unremarkable bilaterally. There is no truncal or gait ataxia.  Sensory exam: intact to light touch in the upper and lower extremities.  Gait, station and balance: She stands without difficulty, posture is slightly stooped for age, but stable. She walks with decreased arm swing on the left, slightly slow in Kimberly Mccormick walking speed with no shuffling noted, no festination, no freezing, stable.  She has mild insecurity with turns, stable.   Assessment and Plan:    In summary, INOCENCIA MURTAUGH is a very pleasant 70 year old female with an underlying medical history of hypertension, hyperlipidemia, reflux disease, anemia, degenerative lumbar disc disease, breast cancer with status post lumpectomy in 2020, chemotherapy, XRT, history of neuropathy, and insomnia, brain aneurysm with status post intravascular intervention in April 2023, and corona radiata stroke in December 2022, who presents for follow-up consultation of Kimberly Mccormick left-sided predominant Parkinson's disease.  Kimberly Mccormick exam is quite stable.  She is advised to continue with Kimberly Mccormick pramipexole at the current dose.  Of note, Kimberly Mccormick DaTscan results were supportive of an underlying parkinsonian syndrome.  She has been on pramipexole since February 2022 with good tolerance.  We increased to 0.5 mg 3 times daily in November 2022.  Angiogram of the head revealed a small supraclinoid aneurysm of the left ICA.  She had intervention under Dr. Estanislado Pandy in April 2023 and has a follow-up appointment next month with brain MRI scheduled as well as MR angiogram of the head. We talked about the importance of maintaining a healthy lifestyle, and secondary stroke prevention.  She is furthermore advised to be proactive about constipation and encouraged to stay better hydrated with water.  She is encouraged to continue to stay  active physically as well.  She is advised to follow-up routinely to see one of our nurse practitioners in 6 months, sooner if needed. I answered all Kimberly Mccormick questions today and she was in agreement.  I renewed Kimberly Mccormick pramipexole prescription today. I spent 40 minutes in total face-to-face time and in reviewing records during pre-charting, more than 50% of which was spent in counseling and coordination of care, reviewing test results, reviewing medications and treatment regimen and/or in discussing or reviewing the diagnosis of PD, the prognosis and treatment options. Pertinent laboratory and imaging test results that were available during this visit with the patient were reviewed by me and considered in my medical decision making (see chart for details).

## 2021-09-26 ENCOUNTER — Ambulatory Visit (HOSPITAL_COMMUNITY)
Admission: RE | Admit: 2021-09-26 | Discharge: 2021-09-26 | Disposition: A | Discharge status: Home / Self Care | Payer: Medicare Other | Source: Ambulatory Visit | Attending: Interventional Radiology | Admitting: Interventional Radiology

## 2021-09-26 DIAGNOSIS — I671 Cerebral aneurysm, nonruptured: Secondary | ICD-10-CM | POA: Diagnosis not present

## 2021-09-26 DIAGNOSIS — I729 Aneurysm of unspecified site: Secondary | ICD-10-CM | POA: Diagnosis not present

## 2021-09-27 ENCOUNTER — Ambulatory Visit (HOSPITAL_COMMUNITY)
Admission: RE | Admit: 2021-09-27 | Discharge: 2021-09-27 | Disposition: A | Discharge status: Home / Self Care | Payer: Medicare Other | Source: Ambulatory Visit | Attending: Interventional Radiology | Admitting: Interventional Radiology

## 2021-09-27 DIAGNOSIS — I671 Cerebral aneurysm, nonruptured: Secondary | ICD-10-CM

## 2021-09-27 DIAGNOSIS — I729 Aneurysm of unspecified site: Secondary | ICD-10-CM | POA: Diagnosis not present

## 2021-09-28 HISTORY — PX: IR RADIOLOGIST EVAL & MGMT: IMG5224

## 2021-10-09 ENCOUNTER — Inpatient Hospital Stay: Payer: Medicare Other | Attending: Hematology and Oncology | Admitting: Hematology and Oncology

## 2021-10-09 ENCOUNTER — Other Ambulatory Visit: Payer: Self-pay

## 2021-10-09 DIAGNOSIS — Z170 Estrogen receptor positive status [ER+]: Secondary | ICD-10-CM | POA: Insufficient documentation

## 2021-10-09 DIAGNOSIS — Z923 Personal history of irradiation: Secondary | ICD-10-CM | POA: Diagnosis not present

## 2021-10-09 DIAGNOSIS — C50212 Malignant neoplasm of upper-inner quadrant of left female breast: Secondary | ICD-10-CM | POA: Diagnosis not present

## 2021-10-09 DIAGNOSIS — N6325 Unspecified lump in the left breast, overlapping quadrants: Secondary | ICD-10-CM | POA: Diagnosis not present

## 2021-10-09 DIAGNOSIS — Z9221 Personal history of antineoplastic chemotherapy: Secondary | ICD-10-CM | POA: Diagnosis not present

## 2021-10-09 NOTE — Assessment & Plan Note (Addendum)
05/08/2018 lumpectomy Kimberly Mccormick): IDC with DCIS, 1.8cm, grade 2, ER+ (95%), PR-, HER2 negative (1+, IHC), Ki67 20%, clear margins, 4 SLN negative. Oncotype DX score 30: Distant recurrence risk at 9 years 19% Treatment plan: 1. Adjuvant chemotherapy with Taxotere and Cytoxan x4(patient refused) 2. Adjuvant radiation therapyat [place] 3. Followed by adjuvant antiestrogen therapy: (Patient undecided) ----------------------------------------------------------------------------------------------------------------------------------  CT chest abdomen pelvis 02/11/2019: No evidence of metastatic disease.  December 2022: Stroke Hospitalization 04/25/2021-04/27/2021 left internal carotid artery aneurysm embolization  Breast cancer surveillance: 1. Mammogram 05/17/2020 at [facility]: Benign, density category C 2. Breast exam 10/09/2021: Scar tissue feels nodular in the left breast.  12 o'clock position there is a small palpable lesion that to exam feels like a tiny lipoma.  We will set her up for a left breast mammogram and ultrasound for further evaluation.  Return to clinic in1 year for checkup

## 2021-10-09 NOTE — Progress Notes (Signed)
Patient Care Team: Curlene Labrum, MD as PCP - General Domenic Polite Aloha Gell, MD as PCP - Cardiology (Cardiology) Mauro Kaufmann, RN as Oncology Nurse Navigator Rockwell Germany, RN as Oncology Nurse Navigator  DIAGNOSIS:  Encounter Diagnosis  Name Primary?   Malignant neoplasm of upper-inner quadrant of left breast in female, estrogen receptor positive ([place])     SUMMARY OF ONCOLOGIC HISTORY: Oncology History  Malignant neoplasm of upper-inner quadrant of left breast in female, estrogen receptor positive ([place])  03/24/2018 Initial Diagnosis   Screening detected left breast mass 8 mm upper inner quadrant biopsy revealed grade 2 IDC with DCIS ER 95%, PR 0%, Ki-67 20%, HER-2 -1+ by IHC, T1 BN 0 stage Ia clinical stage   05/06/2018 Surgery   Lumpectomy [facility] Batman): IDC with DCIS, 1.8cm, grade 2, ER+ (95%), PR-, HER2 negative (1+, IHC), Ki67 20%, clear margins, 4 SLN negative.    05/18/2018 Oncotype testing   Oncotype DX recurrence score 30: risk of distant recurrence at 9 years is 19%. Chemo benefit is >15%.    06/23/2018 -  Chemotherapy   Adjuvant chemotherapy with dose dense Adriamycin and Cytoxan x4 followed by Taxol weekly x12     CHIEF COMPLIANT:  Follow-up of left breast cancer surveillance  INTERVAL HISTORY: Kimberly Mccormick is a 71 y.o. with above-mentioned history of left breast cancer treated with lumpectomy, adjuvant chemotherapy, and radiation. She presents to the clinic for a follow-up. She states that she had a stroke in December and a aneurysm . Also she reports she had parkinson. She complains of a knot above the breast.   ALLERGIES:  is allergic to eggs or egg-derived products, other, and penicillins.  MEDICATIONS:  Current Outpatient Medications  Medication Sig Dispense Refill   acyclovir (ZOVIRAX) 400 MG tablet Take 400 mg by mouth 2 (two) times daily.      aspirin EC 81 MG EC tablet Take 1 tablet (81 mg total) by mouth daily. Swallow whole. 30 tablet 11   cetirizine  (ZYRTEC) 10 MG tablet Take 10 mg by mouth daily as needed for allergies.     Cholecalciferol (VITAMIN D) 2000 UNITS tablet Take 2,000 Units by mouth daily.     ezetimibe (ZETIA) 10 MG tablet Take 10 mg by mouth daily.     ferrous sulfate 325 (65 FE) MG tablet Take 325 mg by mouth daily with breakfast.     LAGEVRIO 200 MG CAPS capsule Take 4 capsules by mouth 2 (two) times daily.     metoprolol succinate (TOPROL-XL) 25 MG 24 hr tablet Take 25 mg by mouth daily.  3   omeprazole (PRILOSEC) 40 MG capsule Take 1 capsule (40 mg total) by mouth daily. (Patient taking differently: Take 40 mg by mouth daily as needed (Acid reflux).) 90 capsule 3   pramipexole (MIRAPEX) 0.5 MG tablet Take 1 tablet (0.5 mg total) by mouth 3 (three) times daily. 270 tablet 3   PROCTO-MED HC 2.5 % rectal cream Place 1 application. rectally 2 (two) times daily as needed for hemorrhoids.     rosuvastatin (CRESTOR) 20 MG tablet Take 1 tablet (20 mg total) by mouth daily. 90 tablet 3   ticagrelor (BRILINTA) 90 MG TABS tablet Take 1 tablet (90 mg total) by mouth 2 (two) times daily. 60 tablet 2   zolpidem (AMBIEN) 10 MG tablet Take 5 mg by mouth at bedtime as needed for sleep.     No current facility-administered medications for this visit.    PHYSICAL EXAMINATION: ECOG  PERFORMANCE STATUS: 1 - Symptomatic but completely ambulatory  Vitals:   10/09/21 0906  BP: (!) 155/82  Pulse: 77  Resp: 18  Temp: (!) 97.3 F (36.3 C)  SpO2: 98%   Filed Weights   10/09/21 0906  Weight: 131 lb 8 oz (59.6 kg)    BREAST: Palpable nodule in the 12 o'clock position left breast measuring less than half a centimeter in size freely movable and feels like a lipoma.. (exam performed in the presence of a chaperone)  LABORATORY DATA:  I have reviewed the data as listed    Latest Ref Rng & Units 06/27/2021    4:59 PM 04/26/2021    6:41 AM 04/25/2021    6:24 AM  CMP  Glucose 70 - 99 mg/dL 93  114  98   BUN 8 - 23 mg/dL _0 Creatinine 0.44 - 1.00 mg/dL 0.98  0.66  1.01   Sodium 135 - 145 mmol/L 141  140  139   Potassium 3.5 - 5.1 mmol/L 3.2  3.4  3.6   Chloride 98 - 111 mmol/L 108  113  108   CO2 22 - 32 mmol/L _1 Calcium 8.9 - 10.3 mg/dL 8.9  8.2  9.1   Total Protein 6.5 - 8.1 g/dL 6.8     Total Bilirubin 0.3 - 1.2 mg/dL 1.0     Alkaline Phos 38 - 126 U/L 87     AST 15 - 41 U/L 23     ALT 0 - 44 U/L 22       Lab Results  Component Value Date   WBC 5.3 06/27/2021   HGB 11.6 (L) 06/27/2021   HCT 35.5 (L) 06/27/2021   MCV 83.9 06/27/2021   PLT 218 06/27/2021   NEUTROABS 8.3 (H) 04/26/2021    ASSESSMENT & PLAN:  Malignant neoplasm of upper-inner quadrant of left breast in female, estrogen receptor positive ([place]) 05/08/2018 lumpectomy [facility] Batman): IDC with DCIS, 1.8cm, grade 2, ER+ (95%), PR-, HER2 negative (1+, IHC), Ki67 20%, clear margins, 4 SLN negative. Oncotype DX score 30: Distant recurrence risk at 9 years 19% Treatment plan: 1.  Adjuvant chemotherapy with Taxotere and Cytoxan x4 (patient refused) 2.  Adjuvant radiation therapy at [facility] 3.  Followed by adjuvant antiestrogen therapy: (Patient undecided) ----------------------------------------------------------------------------------------------------------------------------------  CT chest abdomen pelvis 02/11/2019: No evidence of metastatic disease.  December 2022: Stroke Hospitalization 04/25/2021-04/27/2021 left internal carotid artery aneurysm embolization  Breast cancer surveillance: M[facility]/17/[facility]: Benign, density category C 2. Breast exam 10/09/2021: Scar tissue feels nodular in the left breast.  12 o'clock position there is a small palpable lesion that to exam feels like a tiny lipoma.  We will set her up for a left breast mammogram and ultrasound for further evaluation.  Return to clinic in 1 year for checkup   Orders Placed This Encounter  Procedures   US BREAST LTD UNI LEFT INC AXILLA     Standing Status:   Future    Standing Expiration Date:   10/10/2022    Order Specific Question:   Reason for Exam (SYMPTOM  OR DIAGNOSIS REQUIRED)    Answer:   palpable nodule upper left breast    Order Specific Question:   Preferred imaging location?    Answer:   [facility]    Order Specific Question:   Release to patient    Answer:   Immediate   MM DIAG BREAST TOMO UNI  LEFT    Standing Status:   Future    Standing Expiration Date:   10/10/2022    Order Specific Question:   Reason for Exam (SYMPTOM  OR DIAGNOSIS REQUIRED)    Answer:   palpable lesion left breast    Order Specific Question:   Preferred imaging location?    Answer:   [facility]    Order Specific Question:   Release to patient    Answer:   Immediate   The patient has a good understanding of the overall plan. she agrees with it. she will call with any problems that may develop before the next visit here. Total time spent: 30 mins including face to face time and time spent for planning, charting and co-ordination of care   Harriette Ohara, MD 10/09/21    I Gardiner Coins am scribing for Dr. Lindi Adie  I have reviewed the above documentation for accuracy and completeness, and I agree with the above.

## 2021-10-10 ENCOUNTER — Ambulatory Visit: Payer: Medicare Other | Attending: Cardiology | Admitting: Cardiology

## 2021-10-10 ENCOUNTER — Encounter: Payer: Self-pay | Admitting: Cardiology

## 2021-10-10 VITALS — BP 118/78 | HR 70 | Ht 63.0 in | Wt 130.4 lb

## 2021-10-10 DIAGNOSIS — E782 Mixed hyperlipidemia: Secondary | ICD-10-CM | POA: Diagnosis not present

## 2021-10-10 DIAGNOSIS — Z8673 Personal history of transient ischemic attack (TIA), and cerebral infarction without residual deficits: Secondary | ICD-10-CM

## 2021-10-10 DIAGNOSIS — I10 Essential (primary) hypertension: Secondary | ICD-10-CM

## 2021-10-10 NOTE — Progress Notes (Signed)
Cardiology Office Note  Date: 10/10/2021   ID: [place], [place] 1950/12/02, MRN [id number]  PCP:  Curlene Labrum, MD  Cardiologist:  Rozann Lesches, MD Electrophysiologist:  None   Chief Complaint  Patient presents with   Cardiac follow-up    History of Present Illness: Kimberly Mccormick is a 71 y.o. female last seen in March.  She is here for a follow-up visit.  She did ultimately undergo left internal carotid artery aneurysm embolization with Dr. Estanislado Pandy back in April, tolerated this well.  She was started on Crestor since last encounter, LDL was 134 on Zetia at the time.  We discussed getting a follow-up lipid profile to see if she is closer to goal.  She also remains on Zetia.  Blood pressure today is very well controlled on current regimen.  Past Medical History:  Diagnosis Date   Aneurysm of left internal carotid artery    Small supraclinoid 5 mm   Breast cancer ([place])    Left s/p lumpectomy (05/06/2018) and adjuvant chemotherapy (06/23/2018) with Adriamycin and Cytoxan x4 followed by Taxol weekly x12   DDD (degenerative disc disease), lumbar    Essential hypertension    Genital warts    GERD (gastroesophageal reflux disease)    History of anemia    History of renal insufficiency    Hypertension    Insomnia    Iron deficiency anemia    Ischemic stroke [facility])    December 2022   Mixed hyperlipidemia    Osteoarthritis    Parkinson's disease ([place])    Peripheral neuropathy    Related to chemotherapy    Past Surgical History:  Procedure Laterality Date   APPENDECTOMY  1966   BIOPSY  08/29/2020   Procedure: BIOPSY;  Surgeon: Harvel Quale, MD;  [facility];  Service: Gastroenterology;;   BREAST LUMPECTOMY WITH RADIOACTIVE SEED AND SENTINEL LYMPH NODE BIOPSY Left 05/06/2018   Procedure: LEFT BREAST LUMPECTOMY WITH RADIOACTIVE SEED AND LEFT DEEP AXILLARY SENTINEL LYMPH NODE BIOPSY AND BLUE DYE INJECTION;  Surgeon: Fanny Skates, MD;   Location: [place];  Service: General;  Laterality: Left;   CATARACT EXTRACTION W/PHACO Left 01/10/2014   Procedure: CATARACT EXTRACTION PHACO AND INTRAOCULAR LENS PLACEMENT ; CDE:  4.94;  Surgeon: Williams Che, MD;  Location: [facility];  Service: Ophthalmology;  Laterality: Left;   CESAREAN SECTION  1986   CHOLECYSTECTOMY     COLONOSCOPY WITH PROPOFOL N/A 07/25/2020   Procedure: COLONOSCOPY WITH PROPOFOL;  Surgeon: Harvel Quale, MD;  [facility];  Service: Gastroenterology;  Laterality: N/A;  10:55   ESOPHAGOGASTRODUODENOSCOPY (EGD) WITH PROPOFOL N/A 08/29/2020   Procedure: ESOPHAGOGASTRODUODENOSCOPY (EGD) WITH PROPOFOL;  Surgeon: Harvel Quale, MD;  [facility];  Service: Gastroenterology;  Laterality: N/A;  12:00   IR 3D INDEPENDENT WKST  04/25/2021   IR ANGIO INTRA EXTRACRAN SEL COM CAROTID INNOMINATE UNI R MOD SED  04/25/2021   IR ANGIO INTRA EXTRACRAN SEL INTERNAL CAROTID UNI L MOD SED  04/25/2021   IR ANGIOGRAM FOLLOW UP STUDY  04/25/2021   IR CT HEAD LTD  04/25/2021   IR NEURO EACH ADD'L AFTER BASIC UNI LEFT (MS)  04/25/2021   IR RADIOLOGIST EVAL & MGMT  03/22/2021   IR RADIOLOGIST EVAL & MGMT  03/28/2021   IR RADIOLOGIST EVAL & MGMT  05/15/2021   IR RADIOLOGIST EVAL & MGMT  09/28/2021   IR TRANSCATH/EMBOLIZ  04/25/2021   IR US GUIDE VASC  ACCESS RIGHT  04/25/2021   MYRINGOTOMY WITH TUBE PLACEMENT Right 02/04/2017   Procedure: REVISION OF RIGHT MYRINGOTOMY WITH TUBE PLACEMENT, WITH EXAM OF LEFT EAR;  Surgeon: Leta Baptist, MD;  Location: [place];  Service: ENT;  Laterality: Right;   NASAL SINUS SURGERY  2016   polypectomy   POLYPECTOMY  07/25/2020   Procedure: POLYPECTOMY;  Surgeon: Harvel Quale, MD;  [facility];  Service: Gastroenterology;;   POLYPECTOMY  08/29/2020   Procedure: POLYPECTOMY;  Surgeon: Harvel Quale, MD;  [facility];  Service: Gastroenterology;;   [facility]  REMOVAL N/A 01/06/2019   Procedure: REMOVAL PORT-A-CATH;  Surgeon: Fanny Skates, MD;  Location: [place];  Service: General;  Laterality: N/A;   PORTACATH PLACEMENT Right 06/16/2018   Procedure: INSERTION PORT-A-CATH;  Surgeon: Fanny Skates, MD;  Location: [place];  Service: General;  Laterality: Right;   RADIOLOGY WITH ANESTHESIA N/A 04/25/2021   Procedure: IR WITH ANESTHESIA EMBOLIZATION;  Surgeon: Luanne Bras, MD;  Location: [employer];  Service: Radiology;  Laterality: N/A;   SUBMUCOSAL LIFTING INJECTION  08/29/2020   Procedure: SUBMUCOSAL LIFTING INJECTION;  Surgeon: Montez Morita, Quillian Quince, MD;  [facility];  Service: Gastroenterology;;   TONSILLECTOMY  1970    Current Outpatient Medications  Medication Sig Dispense Refill   acyclovir (ZOVIRAX) 400 MG tablet Take 400 mg by mouth 2 (two) times daily.      aspirin EC 81 MG EC tablet Take 1 tablet (81 mg total) by mouth daily. Swallow whole. 30 tablet 11   cetirizine (ZYRTEC) 10 MG tablet Take 10 mg by mouth daily as needed for allergies.     Cholecalciferol (VITAMIN D) 2000 UNITS tablet Take 2,000 Units by mouth daily.     ezetimibe (ZETIA) 10 MG tablet Take 10 mg by mouth daily.     ferrous sulfate 325 (65 FE) MG tablet Take 325 mg by mouth daily with breakfast.     metoprolol succinate (TOPROL-XL) 25 MG 24 hr tablet Take 25 mg by mouth daily.  3   omeprazole (PRILOSEC) 40 MG capsule Take 1 capsule (40 mg total) by mouth daily. (Patient taking differently: Take 40 mg by mouth daily as needed (Acid reflux).) 90 capsule 3   pramipexole (MIRAPEX) 0.5 MG tablet Take 1 tablet (0.5 mg total) by mouth 3 (three) times daily. 270 tablet 3   PROCTO-MED HC 2.5 % rectal cream Place 1 application. rectally 2 (two) times daily as needed for hemorrhoids.     rosuvastatin (CRESTOR) 20 MG tablet Take 1 tablet (20 mg total) by mouth daily. 90 tablet 3   ticagrelor (BRILINTA) 90 MG TABS tablet Take 1  tablet (90 mg total) by mouth 2 (two) times daily. 60 tablet 2   zolpidem (AMBIEN) 10 MG tablet Take 5 mg by mouth at bedtime as needed for sleep.     No current facility-administered medications for this visit.   Allergies:  Eggs or egg-derived products, Other, and Penicillins   ROS: No palpitations or syncope.  Physical Exam: VS:  BP 118/78   Pulse 70   Ht '5\' 3"'$  (1.6 m)   Wt 130 lb 6.4 oz (59.1 kg)   SpO2 96%   BMI 23.10 kg/m , BMI Body mass index is 23.1 kg/m.  Wt Readings from Last 3 Encounters:  10/10/21 130 lb 6.4 oz (59.1 kg)  10/09/21 131 lb 8 oz (59.6 kg)  09/19/21 129 lb 6.4 oz (58.7 kg)  General: Patient appears comfortable at rest. HEENT: Conjunctiva and lids normal. Neck: Supple, no elevated JVP or carotid bruits. Lungs: Clear to auscultation, nonlabored breathing at rest. Cardiac: Regular rate and rhythm, no S3 or significant systolic murmur. Extremities: No pitting edema.  ECG:  An ECG dated 01/03/2021 was personally reviewed today and demonstrated:  Sinus rhythm with frequent PVCs, nonspecific T wave changes.  Recent Labwork: 01/04/2021: Magnesium 2.1 06/27/2021: ALT 22; AST 23; BUN 14; Creatinine, Ser 0.98; Hemoglobin 11.6; Platelets 218; Potassium 3.2; Sodium 141     Component Value Date/Time   CHOL 244 (H) 01/04/2021 0646   TRIG 73 01/04/2021 0646   HDL 63 01/04/2021 0646   CHOLHDL 3.9 01/04/2021 0646   VLDL 15 01/04/2021 0646   LDLCALC 166 (H) 01/04/2021 0646  March 2023: Cholesterol 235, triglycerides 77, HDL 86, LDL 134  Other Studies Reviewed Today:  Echocardiogram 01/04/2021:  1. Left ventricular ejection fraction, by estimation, is 55 to 60%. The  left ventricle has normal function. The left ventricle has no regional  wall motion abnormalities. There is moderate asymmetric left ventricular  hypertrophy of the basal segment.  Left ventricular diastolic parameters are consistent with Grade I  diastolic dysfunction (impaired relaxation).    2. Right ventricular systolic function is normal. The right ventricular  size is normal. There is normal pulmonary artery systolic pressure. The  estimated right ventricular systolic pressure is 44.0 mmHg.   3. The mitral valve is grossly normal. Trivial mitral valve  regurgitation.   4. The aortic valve is tricuspid. Aortic valve regurgitation is not  visualized. Aortic valve mean gradient measures 4.0 mmHg.   5. The inferior vena cava is normal in size with greater than 50%  respiratory variability, suggesting right atrial pressure of 3 mmHg.    Carotid Dopplers 01/04/2021: IMPRESSION: No significant stenosis of internal carotid arteries.   Cardiac monitor January 2023: [employer] monitor reviewed.  30 days analyzed.  Predominant rhythm is sinus with heart rate ranging from 45 bpm up to 132 bpm and average heart rate 71 bpm.   There were overall rare PVCs,, intermittently more frequent and with ventricular bigeminy.  A few brief bursts of NSVT were noted, 4-5 beats.  No sustained ventricular events.   There were no significant atrial arrhythmias, importantly no atrial fibrillation documented.  Assessment and Plan:  1.  Essential hypertension, blood pressure is well controlled today.  Continue Toprol-XL.  2.  Mixed hyperlipidemia with history of ischemic stroke.  She is now on Crestor and Zetia, tolerating both.  Check FLP and LFTs to see if she is closer to goal.  Last LDL on Zetia alone was 134.  3.  Status post left internal carotid artery aneurysm embolization by Dr. Estanislado Pandy.  She is doing well at this point, continues on aspirin and Brilinta for now with plan to simplify to aspirin alone.  Medication Adjustments/Labs and Tests Ordered: Current medicines are reviewed at length with the patient today.  Concerns regarding medicines are outlined above.   Tests Ordered: Orders Placed This Encounter  Procedures   Hepatic function panel   Lipid panel    Medication Changes: No  orders of the defined types were placed in this encounter.   Disposition:  Follow up  1 year.  Signed, Satira Sark, MD, [facility] 10/10/2021 [place] [facility] [place], [place], [place] [address] Phone: [phone]; Fax: [phone]

## 2021-10-10 NOTE — Patient Instructions (Addendum)
Medication Instructions:  Your physician recommends that you continue on your current medications as directed. Please refer to the Current Medication list given to you today.   Labwork: Lipid Panel (fasting), Hepatic Function Panel [facility])  Testing/Procedures: none  Follow-Up:  Your physician recommends that you schedule a follow-up appointment in: 1 year  Any Other Special Instructions Will Be Listed Below (If Applicable).  You will receive a call in about 10 months reminding you to schedule your appointment. If you do not receive this call, please contact our office.  If you need a refill on your cardiac medications before your next appointment, please call your pharmacy.

## 2021-10-11 ENCOUNTER — Other Ambulatory Visit (HOSPITAL_COMMUNITY)
Admission: RE | Admit: 2021-10-11 | Discharge: 2021-10-11 | Disposition: A | Discharge status: Home / Self Care | Payer: Medicare Other | Source: Ambulatory Visit | Attending: Cardiology | Admitting: Cardiology

## 2021-10-11 DIAGNOSIS — E782 Mixed hyperlipidemia: Secondary | ICD-10-CM | POA: Diagnosis not present

## 2021-10-11 LAB — LIPID PANEL
Cholesterol: 131 mg/dL (ref 0–200)
HDL: 58 mg/dL (ref >40)
LDL Cholesterol: 58 mg/dL (ref 0–99)
Total CHOL/HDL Ratio: 2.3 RATIO
Triglycerides: 75 mg/dL (ref <150)
VLDL: 15 mg/dL (ref 0–40)

## 2021-10-11 LAB — HEPATIC FUNCTION PANEL
ALT: 29 U/L (ref 0–44)
AST: 31 U/L (ref 15–41)
Albumin: 4.1 g/dL (ref 3.5–5.0)
Alkaline Phosphatase: 78 U/L (ref 38–126)
Bilirubin, Direct: 0.2 mg/dL (ref 0.0–0.2)
Indirect Bilirubin: 1.6 mg/dL — ABNORMAL HIGH (ref 0.3–0.9)
Total Bilirubin: 1.8 mg/dL — ABNORMAL HIGH (ref 0.3–1.2)
Total Protein: 7.4 g/dL (ref 6.5–8.1)

## 2021-10-12 DIAGNOSIS — E782 Mixed hyperlipidemia: Secondary | ICD-10-CM | POA: Diagnosis not present

## 2021-10-12 DIAGNOSIS — E7849 Other hyperlipidemia: Secondary | ICD-10-CM | POA: Diagnosis not present

## 2021-10-12 DIAGNOSIS — N179 Acute kidney failure, unspecified: Secondary | ICD-10-CM | POA: Diagnosis not present

## 2021-10-17 DIAGNOSIS — G20 Parkinson's disease: Secondary | ICD-10-CM | POA: Diagnosis not present

## 2021-10-17 DIAGNOSIS — I671 Cerebral aneurysm, nonruptured: Secondary | ICD-10-CM | POA: Diagnosis not present

## 2021-10-17 DIAGNOSIS — I10 Essential (primary) hypertension: Secondary | ICD-10-CM | POA: Diagnosis not present

## 2021-10-17 DIAGNOSIS — M1612 Unilateral primary osteoarthritis, left hip: Secondary | ICD-10-CM | POA: Diagnosis not present

## 2021-10-17 DIAGNOSIS — D509 Iron deficiency anemia, unspecified: Secondary | ICD-10-CM | POA: Diagnosis not present

## 2021-10-17 DIAGNOSIS — Z6822 Body mass index (BMI) 22.0-22.9, adult: Secondary | ICD-10-CM | POA: Diagnosis not present

## 2021-10-17 DIAGNOSIS — M5136 Other intervertebral disc degeneration, lumbar region: Secondary | ICD-10-CM | POA: Diagnosis not present

## 2021-10-17 DIAGNOSIS — K219 Gastro-esophageal reflux disease without esophagitis: Secondary | ICD-10-CM | POA: Diagnosis not present

## 2021-10-17 DIAGNOSIS — E7849 Other hyperlipidemia: Secondary | ICD-10-CM | POA: Diagnosis not present

## 2021-10-17 DIAGNOSIS — I6930 Unspecified sequelae of cerebral infarction: Secondary | ICD-10-CM | POA: Diagnosis not present

## 2021-10-17 DIAGNOSIS — C50912 Malignant neoplasm of unspecified site of left female breast: Secondary | ICD-10-CM | POA: Diagnosis not present

## 2021-10-17 DIAGNOSIS — G4700 Insomnia, unspecified: Secondary | ICD-10-CM | POA: Diagnosis not present

## 2021-11-23 DIAGNOSIS — N390 Urinary tract infection, site not specified: Secondary | ICD-10-CM | POA: Diagnosis not present

## 2021-11-23 DIAGNOSIS — Z6823 Body mass index (BMI) 23.0-23.9, adult: Secondary | ICD-10-CM | POA: Diagnosis not present

## 2021-11-29 ENCOUNTER — Other Ambulatory Visit (HOSPITAL_COMMUNITY): Payer: Self-pay | Admitting: Hematology and Oncology

## 2021-11-29 DIAGNOSIS — Z170 Estrogen receptor positive status [ER+]: Secondary | ICD-10-CM | POA: Diagnosis not present

## 2021-11-29 DIAGNOSIS — N63 Unspecified lump in unspecified breast: Secondary | ICD-10-CM

## 2021-11-29 DIAGNOSIS — C50212 Malignant neoplasm of upper-inner quadrant of left female breast: Secondary | ICD-10-CM | POA: Diagnosis not present

## 2021-12-19 ENCOUNTER — Encounter (HOSPITAL_COMMUNITY): Payer: Self-pay

## 2021-12-19 ENCOUNTER — Ambulatory Visit (HOSPITAL_COMMUNITY)
Admission: RE | Admit: 2021-12-19 | Discharge: 2021-12-19 | Disposition: A | Discharge status: Home / Self Care | Payer: Medicare Other | Source: Ambulatory Visit | Attending: Hematology and Oncology | Admitting: Hematology and Oncology

## 2021-12-19 DIAGNOSIS — C50212 Malignant neoplasm of upper-inner quadrant of left female breast: Secondary | ICD-10-CM

## 2021-12-19 DIAGNOSIS — Z170 Estrogen receptor positive status [ER+]: Secondary | ICD-10-CM | POA: Insufficient documentation

## 2021-12-19 DIAGNOSIS — N63 Unspecified lump in unspecified breast: Secondary | ICD-10-CM | POA: Insufficient documentation

## 2021-12-19 DIAGNOSIS — N6322 Unspecified lump in the left breast, upper inner quadrant: Secondary | ICD-10-CM | POA: Diagnosis not present

## 2022-01-01 ENCOUNTER — Encounter (HOSPITAL_COMMUNITY): Payer: Self-pay | Admitting: Emergency Medicine

## 2022-01-01 ENCOUNTER — Other Ambulatory Visit: Payer: Self-pay

## 2022-01-01 ENCOUNTER — Emergency Department (HOSPITAL_COMMUNITY)
Admission: EM | Admit: 2022-01-01 | Discharge: 2022-01-02 | Disposition: A | Discharge status: Home / Self Care | Payer: Medicare Other | Attending: Emergency Medicine | Admitting: Emergency Medicine

## 2022-01-01 ENCOUNTER — Emergency Department (HOSPITAL_COMMUNITY): Payer: Medicare Other

## 2022-01-01 DIAGNOSIS — M79602 Pain in left arm: Secondary | ICD-10-CM

## 2022-01-01 DIAGNOSIS — I10 Essential (primary) hypertension: Secondary | ICD-10-CM | POA: Insufficient documentation

## 2022-01-01 DIAGNOSIS — R079 Chest pain, unspecified: Secondary | ICD-10-CM | POA: Diagnosis not present

## 2022-01-01 DIAGNOSIS — G20C Parkinsonism, unspecified: Secondary | ICD-10-CM | POA: Diagnosis not present

## 2022-01-01 LAB — CBC
HCT: 37.8 % (ref 36.0–46.0)
Hemoglobin: 12.3 g/dL (ref 12.0–15.0)
MCH: 26.4 pg (ref 26.0–34.0)
MCHC: 32.5 g/dL (ref 30.0–36.0)
MCV: 81.1 fL (ref 80.0–100.0)
Platelets: 193 10*3/uL (ref 150–400)
RBC: 4.66 MIL/uL (ref 3.87–5.11)
RDW: 14.2 % (ref 11.5–15.5)
WBC: 5.3 10*3/uL (ref 4.0–10.5)
nRBC: 0 % (ref 0.0–0.2)

## 2022-01-01 MED ORDER — HYDROCODONE-ACETAMINOPHEN 5-325 MG PO TABS
1.0000 | ORAL_TABLET | Freq: Once | ORAL | Status: AC
  Administered 2022-01-02: 1 via ORAL
  Filled 2022-01-01: qty 1

## 2022-01-01 NOTE — ED Triage Notes (Signed)
   Patient comes in with L arm pain that started about an hour ago. Patient states she was folding clothes and her L arm started to ache.  Patient endorses tightness in her L forearm and soreness in her shoulder.  Family was concerned due to her hx of Parkinson's and previous stroke.  No injury to arm.  Patient able to move it well.  Pulses strong.  Pain 9/10, aching in L arm.  No neurological symptoms at this time.

## 2022-01-02 DIAGNOSIS — M79602 Pain in left arm: Secondary | ICD-10-CM | POA: Diagnosis not present

## 2022-01-02 LAB — TROPONIN I (HIGH SENSITIVITY)
Troponin I (High Sensitivity): 4 ng/L (ref <18)
Troponin I (High Sensitivity): 5 ng/L (ref <18)

## 2022-01-02 LAB — BASIC METABOLIC PANEL
Anion gap: 9 (ref 5–15)
BUN: 14 mg/dL (ref 8–23)
CO2: 25 mmol/L (ref 22–32)
Calcium: 9 mg/dL (ref 8.9–10.3)
Chloride: 105 mmol/L (ref 98–111)
Creatinine, Ser: 0.86 mg/dL (ref 0.44–1.00)
GFR, Estimated: >60 mL/min (ref >60)
Glucose, Bld: 128 mg/dL — ABNORMAL HIGH (ref 70–99)
Potassium: 3.2 mmol/L — ABNORMAL LOW (ref 3.5–5.1)
Sodium: 139 mmol/L (ref 135–145)

## 2022-01-02 NOTE — Discharge Instructions (Signed)
You were evaluated in the Emergency Department and after careful evaluation, we did not find any emergent condition requiring admission or further testing in the hospital.  Your exam/testing today is overall reassuring.  No signs of heart damage.  Recommend Tylenol or Motrin for arm pain, suspect related to muscle strain or spasm.  Please return to the Emergency Department if you experience any worsening of your condition.   Thank you for allowing [place] to be a part of your care.

## 2022-01-02 NOTE — ED Provider Notes (Signed)
[facility] Emergency Department Provider Note MRN:  [id number]  Arrival date & time: 01/02/22     Chief Complaint   Arm Pain   History of Present Illness   Kimberly Mccormick is a 71 y.o. year-old female with a history of breast cancer, stroke presenting to the ED with chief complaint of arm pain.  Pain to the left trapezius and left arm, mostly the biceps region.  Started about 10 PM, constant, moderate in severity.  Denies any chest pain or shortness of breath, no dizziness or diaphoresis, no nausea vomiting, no recent leg pain or swelling.  Was raking leaves earlier today.  Review of Systems  A thorough review of systems was obtained and all systems are negative except as noted in the HPI and PMH.   Patient's Health History    Past Medical History:  Diagnosis Date   Aneurysm of left internal carotid artery    Small supraclinoid 5 mm   Breast cancer ([place])    Left s/p lumpectomy (05/06/2018) and adjuvant chemotherapy (06/23/2018) with Adriamycin and Cytoxan x4 followed by Taxol weekly x12   DDD (degenerative disc disease), lumbar    Essential hypertension    Genital warts    GERD (gastroesophageal reflux disease)    History of anemia    History of renal insufficiency    Hypertension    Insomnia    Iron deficiency anemia    Ischemic stroke [facility])    December 2022   Mixed hyperlipidemia    Osteoarthritis    Parkinson's disease    Peripheral neuropathy    Related to chemotherapy    Past Surgical History:  Procedure Laterality Date   APPENDECTOMY  1966   BIOPSY  08/29/2020   Procedure: BIOPSY;  Surgeon: Harvel Quale, MD;  [facility];  Service: Gastroenterology;;   BREAST LUMPECTOMY WITH RADIOACTIVE SEED AND SENTINEL LYMPH NODE BIOPSY Left 05/06/2018   Procedure: LEFT BREAST LUMPECTOMY WITH RADIOACTIVE SEED AND LEFT DEEP AXILLARY SENTINEL LYMPH NODE BIOPSY AND BLUE DYE INJECTION;  Surgeon: Fanny Skates, MD;  Location: [place];  Service: General;  Laterality: Left;   CATARACT EXTRACTION W/PHACO Left 01/10/2014   Procedure: CATARACT EXTRACTION PHACO AND INTRAOCULAR LENS PLACEMENT ; CDE:  4.94;  Surgeon: Williams Che, MD;  Location: [facility];  Service: Ophthalmology;  Laterality: Left;   CESAREAN SECTION  1986   CHOLECYSTECTOMY     COLONOSCOPY WITH PROPOFOL N/A 07/25/2020   Procedure: COLONOSCOPY WITH PROPOFOL;  Surgeon: Harvel Quale, MD;  [facility];  Service: Gastroenterology;  Laterality: N/A;  10:55   ESOPHAGOGASTRODUODENOSCOPY (EGD) WITH PROPOFOL N/A 08/29/2020   Procedure: ESOPHAGOGASTRODUODENOSCOPY (EGD) WITH PROPOFOL;  Surgeon: Harvel Quale, MD;  [facility];  Service: Gastroenterology;  Laterality: N/A;  12:00   IR 3D INDEPENDENT WKST  04/25/2021   IR ANGIO INTRA EXTRACRAN SEL COM CAROTID INNOMINATE UNI R MOD SED  04/25/2021   IR ANGIO INTRA EXTRACRAN SEL INTERNAL CAROTID UNI L MOD SED  04/25/2021   IR ANGIOGRAM FOLLOW UP STUDY  04/25/2021   IR CT HEAD LTD  04/25/2021   IR NEURO EACH ADD'L AFTER BASIC UNI LEFT (MS)  04/25/2021   IR RADIOLOGIST EVAL & MGMT  03/22/2021   IR RADIOLOGIST EVAL & MGMT  03/28/2021   IR RADIOLOGIST EVAL & MGMT  05/15/2021   IR RADIOLOGIST EVAL & MGMT  09/28/2021   IR TRANSCATH/EMBOLIZ  04/25/2021   IR US GUIDE VASC ACCESS  RIGHT  04/25/2021   MYRINGOTOMY WITH TUBE PLACEMENT Right 02/04/2017   Procedure: REVISION OF RIGHT MYRINGOTOMY WITH TUBE PLACEMENT, WITH EXAM OF LEFT EAR;  Surgeon: Leta Baptist, MD;  Location: [place];  Service: ENT;  Laterality: Right;   NASAL SINUS SURGERY  2016   polypectomy   POLYPECTOMY  07/25/2020   Procedure: POLYPECTOMY;  Surgeon: Harvel Quale, MD;  [facility];  Service: Gastroenterology;;   POLYPECTOMY  08/29/2020   Procedure: POLYPECTOMY;  Surgeon: Harvel Quale, MD;  [facility];  Service: Gastroenterology;;   [facility] REMOVAL N/A 01/06/2019    Procedure: REMOVAL PORT-A-CATH;  Surgeon: Fanny Skates, MD;  Location: [place];  Service: General;  Laterality: N/A;   PORTACATH PLACEMENT Right 06/16/2018   Procedure: INSERTION PORT-A-CATH;  Surgeon: Fanny Skates, MD;  Location: [place];  Service: General;  Laterality: Right;   RADIOLOGY WITH ANESTHESIA N/A 04/25/2021   Procedure: IR WITH ANESTHESIA EMBOLIZATION;  Surgeon: Luanne Bras, MD;  Location: [place];  Service: Radiology;  Laterality: N/A;   SUBMUCOSAL LIFTING INJECTION  08/29/2020   Procedure: SUBMUCOSAL LIFTING INJECTION;  Surgeon: Montez Morita, Quillian Quince, MD;  [facility];  Service: Gastroenterology;;   TONSILLECTOMY  1970    Family History  Problem Relation Age of Onset   Lupus Mother    Heart attack Father        age 56   Prostate cancer Father    Hypothyroidism Sister    Breast cancer Sister    Hypertension Sister    Breast cancer Sister    Hypertension Brother    Parkinson's disease Paternal Aunt    Stroke Neg Hx     Social History   Socioeconomic History   Marital status: Legally Separated    Spouse name: Not on file   Number of children: 2   Years of education: Not on file   Highest education level: Not on file  Occupational History    Comment: retired  Tobacco Use   Smoking status: Never   Smokeless tobacco: Never  Vaping Use   Vaping Use: Never used  Substance and Sexual Activity   Alcohol use: No   Drug use: No   Sexual activity: Not Currently    Birth control/protection: Post-menopausal  Other Topics Concern   Not on file  Social History Narrative   One son lives with her   Social Determinants of Health   Financial Resource Strain: Low Risk  (01/08/2021)   Overall Financial Resource Strain (CARDIA)    Difficulty of Paying Living Expenses: Not hard at all  Food Insecurity: No Food Insecurity (01/08/2021)   Hunger Vital Sign    Worried About Running Out of Food in the Last Year: Never  true    [place] in the Last Year: Never true  Transportation Needs: No Transportation Needs (01/08/2021)   PRAPARE - Hydrologist (Medical): No    Lack of Transportation (Non-Medical): No  Physical Activity: Inactive (01/08/2021)   Exercise Vital Sign    Days of Exercise per Week: 0 days    Minutes of Exercise per Session: 0 min  Stress: No Stress Concern Present (01/08/2021)   [place]    Feeling of Stress : Not at all  Social Connections: Moderately Isolated (01/08/2021)   Social Connection and Isolation Panel [NHANES]    Frequency of Communication with Friends and Family:  More than three times a week    Frequency of Social Gatherings with Friends and Family: More than three times a week    Attends Religious Services: More than 4 times per year    Active Member of Clubs or Organizations: No    Attends Archivist Meetings: Never    Marital Status: Separated  Intimate Partner Violence: Not At Risk (01/08/2021)   Humiliation, Afraid, Rape, and Kick questionnaire    Fear of Current or Ex-Partner: No    Emotionally Abused: No    Physically Abused: No    Sexually Abused: No     Physical Exam   Vitals:   01/02/22 0200 01/02/22 0230  BP: 126/72 110/60  Pulse: (!) 59 (!) 51  Resp: 15 (!) 22  Temp:  98 F (36.7 C)  SpO2: 97% 98%    CONSTITUTIONAL: Well-appearing, NAD NEURO/PSYCH:  Alert and oriented x 3, no focal deficits EYES:  eyes equal and reactive ENT/NECK:  no LAD, no JVD CARDIO: Regular rate, well-perfused, normal S1 and S2 PULM:  CTAB no wheezing or rhonchi GI/GU:  non-distended, non-tender MSK/SPINE:  No gross deformities, no edema SKIN:  no rash, atraumatic   *Additional and/or pertinent findings included in MDM below  Diagnostic and Interventional Summary    EKG Interpretation  Date/Time:  Tuesday January 01 2022 23:49:26 EST Ventricular Rate:   56 PR Interval:  138 QRS Duration: 79 QT Interval:  465 QTC Calculation: 449 R Axis:   17 Text Interpretation: Sinus rhythm LVH with secondary repolarization abnormality Confirmed by Gerlene Fee [phone]) on 01/02/2022 12:45:31 AM       Labs Reviewed  BASIC METABOLIC PANEL - Abnormal; Notable for the following components:      Result Value   Potassium 3.2 (*)    Glucose, Bld 128 (*)    All other components within normal limits  CBC  TROPONIN I (HIGH SENSITIVITY)  TROPONIN I (HIGH SENSITIVITY)    DG Chest Port 1 View  Final Result      Medications  HYDROcodone-acetaminophen (NORCO/VICODIN) 5-325 MG per tablet 1 tablet (1 tablet Oral Given 01/02/22 0017)     Procedures  /  Critical Care Procedures  ED Course and Medical Decision Making  Initial Impression and Ddx Isolated left arm pain, favoring MSK however pain is not elicited with range of motion of the arm and so referred cardiac pain is considered.  Will need 2 troponins.  Limb is neurovascularly intact  Past medical/surgical history that increases complexity of ED encounter: Stroke  Interpretation of Diagnostics I personally reviewed the EKG and my interpretation is as follows: Nonspecific findings, sinus rhythm  Labs reassuring with no significant blood count or electrolyte disturbance, troponin negative x 2, chest x-ray normal  Patient Reassessment and Ultimate Disposition/Management     Patient feels a lot better, she was raking leaves earlier today and so suspect MSK related pain.  Appropriate for discharge.  Patient management required discussion with the following services or consulting groups:  None  Complexity of Problems Addressed Acute illness or injury that poses threat of life of bodily function  Additional Data Reviewed and Analyzed Further history obtained from: Further history from spouse/family member  Additional Factors Impacting ED Encounter Risk None  Barth Kirks. [place] Small, [place] [email or web address]  Final Clinical Impressions(s) / ED Diagnoses     ICD-10-CM   1. Left arm pain  M79.602       ED Discharge Orders  None        Discharge Instructions Discussed with and Provided to Patient:     Discharge Instructions      You were evaluated in the Emergency Department and after careful evaluation, we did not find any emergent condition requiring admission or further testing in the hospital.  Your exam/testing today is overall reassuring.  No signs of heart damage.  Recommend Tylenol or Motrin for arm pain, suspect related to muscle strain or spasm.  Please return to the Emergency Department if you experience any worsening of your condition.   Thank you for allowing [place] to be a part of your care.       Maudie Flakes, MD 01/02/22 [phone]

## 2022-01-15 ENCOUNTER — Ambulatory Visit (INDEPENDENT_AMBULATORY_CARE_PROVIDER_SITE_OTHER): Payer: Medicare Other | Admitting: Internal Medicine

## 2022-01-15 ENCOUNTER — Encounter: Payer: Self-pay | Admitting: Internal Medicine

## 2022-01-15 VITALS — BP 120/68 | HR 62 | Temp 98.4°F | Ht 64.0 in | Wt 130.2 lb

## 2022-01-15 DIAGNOSIS — R0609 Other forms of dyspnea: Secondary | ICD-10-CM

## 2022-01-15 NOTE — Progress Notes (Signed)
CHRISTIEN BERTHELOT, female    DOB: May 24, 1950    MRN: [id number]   Brief patient profile:  51  yobf  never smoker  referred to pulmonary clinic in [place]  01/15/2022 by Dr Pleas Koch for fatigue > sob 1st noted at gym in 2022 p covid 19 x 2 but not clear to the cause / effect / timing and also ? around the time she was on chemo for breast ca   History of Present Illness  01/15/2022  Pulmonary/ 1st office eval/ Ryott Rafferty / [place] Office  Chief Complaint  Patient presents with   Consult    Sob and fatigue   Dyspnea:  sometimes doe on ex bike at gym twice weekly/  sometimes not - says no longer able to ride bike around neighborhood due to doe  Cough: none, no cp  Sleep: variable quality, flat/ 2 pillows / most of the time feels refreshed / not aware of resp cc waking or keeping  her up   SABA use: none  02: none   No obvious day to day or daytime pattern/variability or assoc excess/ purulent sputum or mucus plugs or hemoptysis or cp or chest tightness, subjective wheeze or overt sinus or hb symptoms.   Sleeping  without nocturnal  or early am exacerbation  of respiratory  c/o's or need for noct saba. Also denies any obvious fluctuation of symptoms with weather or environmental changes or other aggravating or alleviating factors except as outlined above   No unusual exposure hx or h/o childhood pna/ asthma or knowledge of premature birth.  Current Allergies, Complete Past Medical History, Past Surgical History, Family History, and Social History were reviewed in [employer] record.  ROS  The following are not active complaints unless bolded Hoarseness, sore throat, dysphagia, dental problems, itching, sneezing,  nasal congestion or discharge of excess mucus or purulent secretions, ear ache,   fever, chills, sweats, unintended wt loss or wt gain, classically pleuritic or exertional cp,  orthopnea pnd or arm/hand swelling  or leg swelling, presyncope, palpitations,  abdominal pain, anorexia, nausea, vomiting, diarrhea  or change in bowel habits or change in bladder habits, change in stools or change in urine, dysuria, hematuria,  rash, arthralgias, visual complaints, headache, numbness, weakness, generalized with fatigue fluctuating or ataxia or problems with walking or coordination,  change in mood or  memory.             Past Medical History:  Diagnosis Date   Aneurysm of left internal carotid artery    Small supraclinoid 5 mm   Breast cancer ([place])    Left s/p lumpectomy (05/06/2018) and adjuvant chemotherapy (06/23/2018) with Adriamycin and Cytoxan x4 followed by Taxol weekly x12   DDD (degenerative disc disease), lumbar    Essential hypertension    Genital warts    GERD (gastroesophageal reflux disease)    History of anemia    History of renal insufficiency    Hypertension    Insomnia    Iron deficiency anemia    Ischemic stroke [facility])    December 2022   Mixed hyperlipidemia    Osteoarthritis    Parkinson's disease    Peripheral neuropathy    Related to chemotherapy    Outpatient Medications Prior to Visit  Medication Sig Dispense Refill   acyclovir (ZOVIRAX) 400 MG tablet Take 400 mg by mouth 2 (two) times daily.      aspirin EC 81 MG EC tablet Take 1 tablet (81 mg total) by mouth  daily. Swallow whole. 30 tablet 11   cetirizine (ZYRTEC) 10 MG tablet Take 10 mg by mouth daily as needed for allergies.     Cholecalciferol (VITAMIN D) 2000 UNITS tablet Take 2,000 Units by mouth daily.     ferrous sulfate 325 (65 FE) MG tablet Take 325 mg by mouth daily with breakfast.     metoprolol succinate (TOPROL-XL) 25 MG 24 hr tablet Take 25 mg by mouth daily.  3   omeprazole (PRILOSEC) 40 MG capsule Take 1 capsule (40 mg total) by mouth daily. (Patient taking differently: Take 40 mg by mouth daily as needed (Acid reflux).) 90 capsule 3   pramipexole (MIRAPEX) 0.5 MG tablet Take 1 tablet (0.5 mg total) by mouth 3 (three) times daily. 270 tablet 3    PROCTO-MED HC 2.5 % rectal cream Place 1 application. rectally 2 (two) times daily as needed for hemorrhoids.     rosuvastatin (CRESTOR) 20 MG tablet Take 1 tablet (20 mg total) by mouth daily. 90 tablet 3   zolpidem (AMBIEN) 10 MG tablet Take 5 mg by mouth at bedtime as needed for sleep.     ezetimibe (ZETIA) 10 MG tablet Take 10 mg by mouth daily.     ticagrelor (BRILINTA) 90 MG TABS tablet Take 1 tablet (90 mg total) by mouth 2 (two) times daily. 60 tablet 2   No facility-administered medications prior to visit.     Objective:     BP 120/68   Pulse 62   Temp 98.4 F (36.9 C)   Ht _0  (1.626 m)   Wt 130 lb 3.2 oz (59.1 kg)   SpO2 100% Comment: ra  BMI 22.35 kg/m   SpO2: 100 % (ra)  Somber amb bf nad   HEENT : Oropharynx  clear       NECK :  without  apparent JVD/ palpable Nodes/TM    LUNGS: no acc muscle use,  Nl contour chest which is clear to A and P bilaterally without cough on insp or exp maneuvers   CV:  RRR  no s3 or murmur or increase in P2, and no edema   ABD:  soft and nontender with nl inspiratory excursion in the supine position. No bruits or organomegaly appreciated   MS:  Nl gait/ ext warm without deformities Or obvious joint restrictions  calf tenderness, cyanosis or clubbing    SKIN: warm and dry without lesions    NEURO:  alert, approp, nl sensorium with  no motor or cerebellar deficits apparent.     I personally reviewed images and agree with radiology impression as follows:  CXR:   portable  01/01/22 1. No evidence of acute chest disease. 2. Osteopenia and degenerative change of the spine. My review:  no CM   Labs ordered/ reviewed:      Chemistry      Component Value Date/Time   NA 139 01/01/2022 2342   NA 144 12/20/2019 1104   K 3.2 (L) 01/01/2022 2342   CL 105 01/01/2022 2342   CO2 25 01/01/2022 2342   BUN 14 01/01/2022 2342   BUN 9 12/20/2019 1104   CREATININE 0.86 01/01/2022 2342   CREATININE 0.82 06/29/2020 1052       Component Value Date/Time   CALCIUM 9.0 01/01/2022 2342   ALKPHOS 78 10/11/2021 0915   AST 31 10/11/2021 0915   AST 18 06/29/2020 1052   ALT 29 10/11/2021 0915   ALT 13 06/29/2020 1052   BILITOT 1.8 (H) 10/11/2021 0915  BILITOT 1.0 06/29/2020 1052        Lab Results  Component Value Date   WBC 5.3 01/01/2022   HGB 12.3 01/01/2022   HCT 37.8 01/01/2022   MCV 81.1 01/01/2022   PLT 193 01/01/2022     Labs 12/2921 : Hgb  12.1   MCV 79 D dimer 327 (nl < 500) ESR  17    TSH  0.397  BNP  158          Assessment   DOE (dyspnea on exertion) Onset ? 2022 (inconsistent hx)  Echocardiogram 01/04/2021:  1. Left ventricular ejection fraction, by estimation, is 55 to 60%. The  left ventricle has normal function. The left ventricle has no regional  wall motion abnormalities. There is moderate asymmetric left ventricular  hypertrophy of the basal segment with Grade 1  diastolic dysfunction    2. Right ventricular systolic function is normal. The right ventricular  size is normal. There is normal pulmonary artery systolic pressure. The  estimated right ventricular systolic pressure is 19.4 mmHg.   3. The mitral valve is grossly normal. Trivial mitral valve  regurgitation.   4. The aortic valve is tricuspid. Aortic valve regurgitation is not  visualized. Aortic valve mean gradient measures 4.0 mmHg.   5. The inferior vena cava is normal in size with greater than 50%  respiratory variability, suggesting right atrial pressure of 3 mmHg  - 01/15/2022   Walked on RA  x  3  lap(s) =  approx 450  ft  @ mod to fast pace, stopped due to end of study  with lowest 02 sats 98% with minimal sob last lap   Symptoms are   disproportionate to objective findings and not clear to what extent this is actually a pulmonary  problem but pt does appear to have difficult to sort out respiratory symptoms of unknown origin for which  DDX  = almost all start with A and  include Adherence, Ace  Inhibitors, Acid Reflux, Active Sinus Disease, Alpha 1 Antitripsin deficiency, Anxiety masquerading as Airways dz,  ABPA,  Allergy(esp in young), Aspiration (esp in elderly), Adverse effects of meds,  Active smoking or Vaping, A bunch of PE's/clot burden (a few small clots can't cause this syndrome unless there is already severe underlying pulm or vascular dz with poor reserve),  Anemia or thyroid disorder, plus two Bs  = Bronchiectasis and Beta blocker use..and one C= CHF    Adherence is always the initial "prime suspect" and is a multilayered concern that requires a "trust but verify" approach in every patient - starting with knowing how to use medications, especially inhalers, correctly, keeping up with refills and understanding the fundamental difference between maintenance and prns vs those medications only taken for a very short course and then stopped and not refilled.  - if needs to return advised with all meds in hand using a trust but verify approach to confirm accurate Medication  Reconciliation The principal here is that until we are certain that the  patients are doing what we've asked, it makes no sense to ask them to do more.   ? Anemia/ thyroid disorder:  both hgb and tsh are a bit low but I would not think enough to cause doe but need serial f/u by PCP long term in case there's a trend  ? Allergy /asthma  - variability supports this dx but no assoc rhinitis/ cough or noct events or prior h/o childhood asthma rules against   A bunch  of PEs > D dimer nl - while a normal  or high normal value (seen commonly in the elderly or chronically ill)  may miss small peripheral pe, the clot burden with sob is moderately high and the d dimer  has a very high neg pred value if used in this setting.    ? Anxiety/depression/ deconditioning  > usually at the bottom of this list of usual suspects but may interfere with adherence and also interpretation of response or lack thereof to symptom management which  can be quite subjective. The inconsistency in her hx suggests this may be factor Rec: sub  max ex at a minimum of 3 x weekly and f/u in this clinic if not improved  ? Adverse drug effects > none of the usual suspects listed  ? BB effects > this low a dose of lopressor unlikely to cause pulmonary problems   ? Chf > not grade 1 diastolic dysfunction > rx per cards    >>> f/u in pulmonary clinic can be prn.- CPST an option if not making progress with ex program above.   Each maintenance medication was reviewed in detail including emphasizing most importantly the difference between maintenance and prns and under what circumstances the prns are to be triggered using an action plan format where appropriate.  Total time for H and P, chart review, counseling,  directly observing portions of ambulatory 02 saturation study/ and generating customized AVS unique to this office visit / same day charting  > 45 min pt new to me                   Christinia Gully, MD 01/15/2022

## 2022-01-15 NOTE — Progress Notes (Signed)
Patient did not want to get labs directly after ov today since she had somewhere to be in [place] today. Advised her to get labs at any [facility] this week.

## 2022-01-15 NOTE — Patient Instructions (Signed)
To get the most out of exercise, you need to be continuously aware that you are short of breath, but never out of breath, for at least 30 minutes daily. As you improve, it will actually be easier for you to do the same amount of exercise  in  30 minutes so always push to the level where you are short of breath.     Make sure you check your oxygen saturations at highest level of activity   Please remember to go to the lab department   for your tests - we will call you with the results when they are available.

## 2022-01-17 DIAGNOSIS — R897 Abnormal histological findings in specimens from other organs, systems and tissues: Secondary | ICD-10-CM | POA: Diagnosis not present

## 2022-01-17 DIAGNOSIS — R0602 Shortness of breath: Secondary | ICD-10-CM | POA: Diagnosis not present

## 2022-01-17 DIAGNOSIS — R7303 Prediabetes: Secondary | ICD-10-CM | POA: Diagnosis not present

## 2022-01-19 NOTE — Assessment & Plan Note (Addendum)
Onset ? 2022 (inconsistent hx)  Echocardiogram 01/04/2021:  1. Left ventricular ejection fraction, by estimation, is 55 to 60%. The  left ventricle has normal function. The left ventricle has no regional  wall motion abnormalities. There is moderate asymmetric left ventricular  hypertrophy of the basal segment with Grade 1  diastolic dysfunction    2. Right ventricular systolic function is normal. The right ventricular  size is normal. There is normal pulmonary artery systolic pressure. The  estimated right ventricular systolic pressure is 09.7 mmHg.   3. The mitral valve is grossly normal. Trivial mitral valve  regurgitation.   4. The aortic valve is tricuspid. Aortic valve regurgitation is not  visualized. Aortic valve mean gradient measures 4.0 mmHg.   5. The inferior vena cava is normal in size with greater than 50%  respiratory variability, suggesting right atrial pressure of 3 mmHg  - 01/15/2022   Walked on RA  x  3  lap(s) =  approx 450  ft  @ mod to fast pace, stopped due to end of study  with lowest 02 sats 98% with minimal sob last lap   Symptoms are   disproportionate to objective findings and not clear to what extent this is actually a pulmonary  problem but pt does appear to have difficult to sort out respiratory symptoms of unknown origin for which  DDX  = almost all start with A and  include Adherence, Ace Inhibitors, Acid Reflux, Active Sinus Disease, Alpha 1 Antitripsin deficiency, Anxiety masquerading as Airways dz,  ABPA,  Allergy(esp in young), Aspiration (esp in elderly), Adverse effects of meds,  Active smoking or Vaping, A bunch of PE's/clot burden (a few small clots can't cause this syndrome unless there is already severe underlying pulm or vascular dz with poor reserve),  Anemia or thyroid disorder, plus two Bs  = Bronchiectasis and Beta blocker use..and one C= CHF    Adherence is always the initial "prime suspect" and is a multilayered concern that requires a "trust but  verify" approach in every patient - starting with knowing how to use medications, especially inhalers, correctly, keeping up with refills and understanding the fundamental difference between maintenance and prns vs those medications only taken for a very short course and then stopped and not refilled.  - if needs to return advised with all meds in hand using a trust but verify approach to confirm accurate Medication  Reconciliation The principal here is that until we are certain that the  patients are doing what we've asked, it makes no sense to ask them to do more.   ? Anemia/ thyroid disorder:  both hgb and tsh are a bit low but I would not think enough to cause doe but need serial f/u by PCP long term in case there's a trend  ? Allergy /asthma  - variability supports this dx but no assoc rhinitis/ cough or noct events or prior h/o childhood asthma rules against   A bunch of PEs > D dimer nl - while a normal  or high normal value (seen commonly in the elderly or chronically ill)  may miss small peripheral pe, the clot burden with sob is moderately high and the d dimer  has a very high neg pred value if used in this setting.    ? Anxiety/depression/ deconditioning  > usually at the bottom of this list of usual suspects but may interfere with adherence and also interpretation of response or lack thereof to symptom management which can be  quite subjective. The inconsistency in her hx suggests this may be factor Rec: sub  max ex at a minimum of 3 x weekly and f/u in this clinic if not improved  ? Adverse drug effects > none of the usual suspects listed  ? BB effects > this low a dose of lopressor unlikely to cause pulmonary problems   ? Chf > not grade 1 diastolic dysfunction > rx per cards    >>> f/u in pulmonary clinic can be prn.- CPST an option if not making progress with ex program above.   Each maintenance medication was reviewed in detail including emphasizing most importantly the  difference between maintenance and prns and under what circumstances the prns are to be triggered using an action plan format where appropriate.  Total time for H and P, chart review, counseling,  directly observing portions of ambulatory 02 saturation study/ and generating customized AVS unique to this office visit / same day charting  > 45 min pt new to me

## 2022-03-13 ENCOUNTER — Encounter (HOSPITAL_COMMUNITY): Payer: Self-pay | Admitting: *Deleted

## 2022-03-13 ENCOUNTER — Emergency Department (HOSPITAL_COMMUNITY): Payer: 59

## 2022-03-13 ENCOUNTER — Emergency Department (HOSPITAL_COMMUNITY)
Admission: EM | Admit: 2022-03-13 | Discharge: 2022-03-14 | Disposition: A | Discharge status: Home / Self Care | Payer: 59 | Attending: Emergency Medicine | Admitting: Emergency Medicine

## 2022-03-13 ENCOUNTER — Other Ambulatory Visit: Payer: Self-pay

## 2022-03-13 DIAGNOSIS — R918 Other nonspecific abnormal finding of lung field: Secondary | ICD-10-CM | POA: Diagnosis not present

## 2022-03-13 DIAGNOSIS — K449 Diaphragmatic hernia without obstruction or gangrene: Secondary | ICD-10-CM | POA: Diagnosis not present

## 2022-03-13 DIAGNOSIS — N2889 Other specified disorders of kidney and ureter: Secondary | ICD-10-CM | POA: Diagnosis not present

## 2022-03-13 DIAGNOSIS — R0789 Other chest pain: Secondary | ICD-10-CM | POA: Diagnosis not present

## 2022-03-13 DIAGNOSIS — Z7982 Long term (current) use of aspirin: Secondary | ICD-10-CM | POA: Insufficient documentation

## 2022-03-13 DIAGNOSIS — I700 Atherosclerosis of aorta: Secondary | ICD-10-CM | POA: Insufficient documentation

## 2022-03-13 DIAGNOSIS — Z79899 Other long term (current) drug therapy: Secondary | ICD-10-CM | POA: Insufficient documentation

## 2022-03-13 DIAGNOSIS — R079 Chest pain, unspecified: Secondary | ICD-10-CM | POA: Diagnosis not present

## 2022-03-13 DIAGNOSIS — Z853 Personal history of malignant neoplasm of breast: Secondary | ICD-10-CM | POA: Insufficient documentation

## 2022-03-13 DIAGNOSIS — I2510 Atherosclerotic heart disease of native coronary artery without angina pectoris: Secondary | ICD-10-CM | POA: Diagnosis not present

## 2022-03-13 DIAGNOSIS — R0602 Shortness of breath: Secondary | ICD-10-CM | POA: Diagnosis not present

## 2022-03-13 LAB — CBC
HCT: 36.6 % (ref 36.0–46.0)
Hemoglobin: 12.1 g/dL (ref 12.0–15.0)
MCH: 26.9 pg (ref 26.0–34.0)
MCHC: 33.1 g/dL (ref 30.0–36.0)
MCV: 81.3 fL (ref 80.0–100.0)
Platelets: 201 10*3/uL (ref 150–400)
RBC: 4.5 MIL/uL (ref 3.87–5.11)
RDW: 13.7 % (ref 11.5–15.5)
WBC: 8.1 10*3/uL (ref 4.0–10.5)
nRBC: 0 % (ref 0.0–0.2)

## 2022-03-13 LAB — BASIC METABOLIC PANEL
Anion gap: 7 (ref 5–15)
BUN: 15 mg/dL (ref 8–23)
CO2: 24 mmol/L (ref 22–32)
Calcium: 8.6 mg/dL — ABNORMAL LOW (ref 8.9–10.3)
Chloride: 107 mmol/L (ref 98–111)
Creatinine, Ser: 0.83 mg/dL (ref 0.44–1.00)
GFR, Estimated: >60 mL/min (ref >60)
Glucose, Bld: 113 mg/dL — ABNORMAL HIGH (ref 70–99)
Potassium: 3.4 mmol/L — ABNORMAL LOW (ref 3.5–5.1)
Sodium: 138 mmol/L (ref 135–145)

## 2022-03-13 LAB — TROPONIN I (HIGH SENSITIVITY)
Troponin I (High Sensitivity): 4 ng/L (ref <18)
Troponin I (High Sensitivity): 4 ng/L (ref <18)

## 2022-03-13 MED ORDER — PANTOPRAZOLE SODIUM 40 MG IV SOLR
40.0000 mg | Freq: Once | INTRAVENOUS | Status: AC
  Administered 2022-03-14: 40 mg via INTRAVENOUS
  Filled 2022-03-13: qty 10

## 2022-03-13 MED ORDER — ALUM & MAG HYDROXIDE-SIMETH 200-200-20 MG/5ML PO SUSP
30.0000 mL | Freq: Once | ORAL | Status: AC
  Administered 2022-03-14: 30 mL via ORAL
  Filled 2022-03-13: qty 30

## 2022-03-13 MED ORDER — IOHEXOL 300 MG/ML  SOLN
100.0000 mL | Freq: Once | INTRAMUSCULAR | Status: AC | PRN
  Administered 2022-03-13: 100 mL via INTRAVENOUS

## 2022-03-13 NOTE — ED Notes (Signed)
Patient transported to CT 

## 2022-03-13 NOTE — ED Triage Notes (Signed)
Pt with left sided CP that started around 1600 today with SOB.  No distress noted.  Denies any N/V

## 2022-03-13 NOTE — ED Provider Notes (Signed)
[facility] Provider Note   CSN: [id number] Arrival date & time: 03/13/22  2008     History {Add pertinent medical, surgical, social history, OB history to HPI:1} Chief Complaint  Patient presents with   Chest Pain    Kimberly Mccormick is a 72 y.o. female.  72 year old female who presents the ER today with a couple day for complaints.  The main reason for coming today is that she has left-sided chest pain that radiates towards her back and into her abdomen.  She states that this started after she drank some shake that somebody made for her with a bunch of fruits and vegetables.  She wonders if it could be indigestion but is concerned about heart or stroke.  She also relays that she has had about a month of decreased appetite and so she does not have any energy and that is why she tried to shake today.  She states that during that time she is lost probably about 12 pounds.  She has a history of breast cancer from a few years ago but had surgery, chemo and radiation and was given a clean bill of health.  She also had 2 aunts die within 4 days of each other about a couple months ago which is kind to hit her hard.  No fevers, chills, shortness of breath, leg swelling, blood in her stool, dark stools, emesis, urinary symptoms or other associated symptoms.   Chest Pain      Home Medications Prior to Admission medications   Medication Sig Start Date End Date Taking? Authorizing Provider  acyclovir (ZOVIRAX) 400 MG tablet Take 400 mg by mouth 2 (two) times daily.     [provider]  aspirin EC 81 MG EC tablet Take 1 tablet (81 mg total) by mouth daily. Swallow whole. 01/05/21   Geradine Girt, DO  cetirizine (ZYRTEC) 10 MG tablet Take 10 mg by mouth daily as needed for allergies.    [provider]  Cholecalciferol (VITAMIN D) 2000 UNITS tablet Take 2,000 Units by mouth daily.    [provider]  ferrous sulfate 325 (65  FE) MG tablet Take 325 mg by mouth daily with breakfast.    [provider]  metoprolol succinate (TOPROL-XL) 25 MG 24 hr tablet Take 25 mg by mouth daily. 05/28/17   [provider]  omeprazole (PRILOSEC) 40 MG capsule Take 1 capsule (40 mg total) by mouth daily. Patient taking differently: Take 40 mg by mouth daily as needed (Acid reflux). 11/27/20   Harvel Quale, MD  pramipexole (MIRAPEX) 0.5 MG tablet Take 1 tablet (0.5 mg total) by mouth 3 (three) times daily. 09/19/21   Star Age, MD  PROCTO-MED [facility] 2.5 % rectal cream Place 1 application. rectally 2 (two) times daily as needed for hemorrhoids. 07/19/20   [provider]  rosuvastatin (CRESTOR) 20 MG tablet Take 1 tablet (20 mg total) by mouth daily. 04/17/21   Satira Sark, MD  zolpidem (AMBIEN) 10 MG tablet Take 5 mg by mouth at bedtime as needed for sleep.    [provider]      Allergies    Eggs or egg-derived products, Other, and Penicillins    Review of Systems   Review of Systems  Cardiovascular:  Positive for chest pain.    Physical Exam Updated Vital Signs BP (!) 156/99   Pulse 62   Temp 98 F (36.7 C) (Oral)   Resp 17  Ht 5' 4"$  (1.626 m)   Wt 57.2 kg   SpO2 100%   BMI 21.63 kg/m  Physical Exam Vitals and nursing note reviewed.  Constitutional:      Appearance: She is well-developed.  HENT:     Head: Normocephalic and atraumatic.  Cardiovascular:     Rate and Rhythm: Normal rate and regular rhythm.  Pulmonary:     Effort: No respiratory distress.     Breath sounds: No stridor.  Chest:     Chest wall: No tenderness.  Abdominal:     General: There is no distension or abdominal bruit.     Palpations: Abdomen is soft.  Musculoskeletal:        General: Normal range of motion.     Cervical back: Normal range of motion.     Right lower leg: No edema.     Left lower leg: No edema.  Skin:    General: Skin is warm and dry.  Neurological:     Mental Status:  She is alert.     ED Results / Procedures / Treatments   Labs (all labs ordered are listed, but only abnormal results are displayed) Labs Reviewed  BASIC METABOLIC PANEL - Abnormal; Notable for the following components:      Result Value   Potassium 3.4 (*)    Glucose, Bld 113 (*)    Calcium 8.6 (*)    All other components within normal limits  CBC  TROPONIN I (HIGH SENSITIVITY)  TROPONIN I (HIGH SENSITIVITY)    EKG EKG Interpretation  Date/Time:  Wednesday March 13 2022 20:20:12 EST Ventricular Rate:  80 PR Interval:  130 QRS Duration: 74 QT Interval:  406 QTC Calculation: 468 R Axis:   21 Text Interpretation: Sinus rhythm with frequent Premature ventricular complexes Minimal voltage criteria for LVH, may be normal variant ( R in aVL ) Nonspecific T wave abnormality Abnormal ECG Confirmed by Godfrey Pick [phone]) on 03/13/2022 9:21:04 PM  Radiology DG Chest 2 View  Result Date: 03/13/2022 CLINICAL DATA:  Chest pain and shortness of breath. History of left breast cancer. EXAM: CHEST - 2 VIEW COMPARISON:  Chest radiographs 01/01/2022 and 02/14/2019 FINDINGS: Cardiac silhouette and mediastinal contours are within normal limits. Mild calcification within aortic arch. Flattening of the diaphragms and mild hyperinflation is similar to prior. The lungs are clear. No pleural effusion or pneumothorax. Mild-to-moderate multilevel degenerative disc changes of the thoracic spine. Increased moderate kyphotic angulation of the upper thoracic spine. Surgical clips again overlie the left breast. The left breast is again smaller than the right. Cholecystectomy clips. IMPRESSION: 1. No acute cardiopulmonary process. 2. Mild hyperinflation of the lungs suggesting COPD. 3. Moderate kyphotic angulation of the upper thoracic spine. Electronically Signed   By: Yvonne Kendall M.D.   On: 03/13/2022 21:11    Procedures Procedures    Medications Ordered in ED Medications  iohexol (OMNIPAQUE) 300 MG/ML  solution 100 mL (has no administration in time range)    ED Course/ Medical Decision Making/ A&P                             Medical Decision Making Amount and/or Complexity of Data Reviewed Labs: ordered. Radiology: ordered.  Risk Prescription drug management.   72 year old female here with left-sided symptoms likely related to GI system however with her cancer history recent weight loss also consider possible recurrent malignancy.  Seems like her nausea decreased appetite weight loss is probably  more likely related to the acute depressive episode related to multiple family members that she was close to dying.  Will get a CT scan to evaluate for same.  Troponins negative EKG reassuring doubt ACS.  {Document critical care time when appropriate:1} {Document review of labs and clinical decision tools ie heart score, Chads2Vasc2 etc:1}  {Document your independent review of radiology images, and any outside records:1} {Document your discussion with family members, caretakers, and with consultants:1} {Document social determinants of health affecting pt's care:1} {Document your decision making why or why not admission, treatments were needed:1} Final Clinical Impression(s) / ED Diagnoses Final diagnoses:  None    Rx / DC Orders ED Discharge Orders     None

## 2022-03-14 DIAGNOSIS — R079 Chest pain, unspecified: Secondary | ICD-10-CM | POA: Diagnosis not present

## 2022-03-14 DIAGNOSIS — N2889 Other specified disorders of kidney and ureter: Secondary | ICD-10-CM | POA: Diagnosis not present

## 2022-03-14 DIAGNOSIS — R918 Other nonspecific abnormal finding of lung field: Secondary | ICD-10-CM | POA: Diagnosis not present

## 2022-03-14 MED ORDER — FAMOTIDINE 20 MG PO TABS: 20.0000 mg | ORAL_TABLET | Freq: Two times a day (BID) | ORAL | 0 refills | Status: DC

## 2022-03-14 MED ORDER — ALUM & MAG HYDROXIDE-SIMETH 400-400-40 MG/5ML PO SUSP: 15.0000 mL | Freq: Four times a day (QID) | ORAL | 0 refills | Status: DC | PRN

## 2022-03-14 MED ORDER — HYDROMORPHONE HCL 1 MG/ML IJ SOLN
0.5000 mg | Freq: Once | INTRAMUSCULAR | Status: AC
  Administered 2022-03-14: 0.5 mg via INTRAVENOUS
  Filled 2022-03-14: qty 0.5

## 2022-03-21 ENCOUNTER — Ambulatory Visit: Payer: 59 | Admitting: Adult Health

## 2022-03-27 NOTE — Progress Notes (Unsigned)
[facility] [address] [place]. [place] [address] [phone]       OFFICE FOLLOW UP NOTE  Ms. XENIA GILGENBACH Date of Birth:  27-Oct-1950 Medical Record Number:  [id number]    Primary neurologist: Dr. Rexene Alberts Reason for visit: Parkinson's disease    SUBJECTIVE:   CHIEF COMPLAINT:  No chief complaint on file.   Brief HPI:   FRADY BETLER is a 72 y.o. female who is being followed for left-sided predominant Parkinson's disease. DaTscan supportive of diagnosis. Hx of right corona radiata infarct 12/2020 with incidental finding of cerebral aneurysm on workup s/p endovascular treatment with flow diverter device 04/2021 by Dr. Estanislado Pandy.  At prior visit on 09/19/2021 with Dr. Radford Pax, she continued on pramipexole 0.5 mg TID as stable at that time.    Interval history:        ROS:   14 system review of systems performed and negative with exception of ***  PMH:  Past Medical History:  Diagnosis Date   Aneurysm of left internal carotid artery    Small supraclinoid 5 mm   Breast cancer (HCC)    Left s/p lumpectomy (05/06/2018) and adjuvant chemotherapy (06/23/2018) with Adriamycin and Cytoxan x4 followed by Taxol weekly x12   DDD (degenerative disc disease), lumbar    Essential hypertension    Genital warts    GERD (gastroesophageal reflux disease)    History of anemia    History of renal insufficiency    Hypertension    Insomnia    Iron deficiency anemia    Ischemic stroke [facility])    December 2022   Mixed hyperlipidemia    Osteoarthritis    Parkinson's disease    Peripheral neuropathy    Related to chemotherapy    PSH:  Past Surgical History:  Procedure Laterality Date   APPENDECTOMY  1966   BIOPSY  08/29/2020   Procedure: BIOPSY;  Surgeon: Harvel Quale, MD;  [facility];  Service: Gastroenterology;;   BREAST LUMPECTOMY WITH RADIOACTIVE SEED AND SENTINEL LYMPH NODE BIOPSY Left 05/06/2018   Procedure: LEFT BREAST  LUMPECTOMY WITH RADIOACTIVE SEED AND LEFT DEEP AXILLARY SENTINEL LYMPH NODE BIOPSY AND BLUE DYE INJECTION;  Surgeon: Fanny Skates, MD;  Location: [facility];  Service: General;  Laterality: Left;   CATARACT EXTRACTION W/PHACO Left 01/10/2014   Procedure: CATARACT EXTRACTION PHACO AND INTRAOCULAR LENS PLACEMENT ; CDE:  4.94;  Surgeon: Williams Che, MD;  Location: [facility];  Service: Ophthalmology;  Laterality: Left;   CESAREAN SECTION  1986   CHOLECYSTECTOMY     COLONOSCOPY WITH PROPOFOL N/A 07/25/2020   Procedure: COLONOSCOPY WITH PROPOFOL;  Surgeon: Harvel Quale, MD;  [facility];  Service: Gastroenterology;  Laterality: N/A;  10:55   ESOPHAGOGASTRODUODENOSCOPY (EGD) WITH PROPOFOL N/A 08/29/2020   Procedure: ESOPHAGOGASTRODUODENOSCOPY (EGD) WITH PROPOFOL;  Surgeon: Harvel Quale, MD;  [facility];  Service: Gastroenterology;  Laterality: N/A;  12:00   IR 3D INDEPENDENT WKST  04/25/2021   IR ANGIO INTRA EXTRACRAN SEL COM CAROTID INNOMINATE UNI R MOD SED  04/25/2021   IR ANGIO INTRA EXTRACRAN SEL INTERNAL CAROTID UNI L MOD SED  04/25/2021   IR ANGIOGRAM FOLLOW UP STUDY  04/25/2021   IR CT HEAD LTD  04/25/2021   IR NEURO EACH ADD'L AFTER BASIC UNI LEFT (MS)  04/25/2021   IR RADIOLOGIST EVAL & MGMT  03/22/2021   IR RADIOLOGIST EVAL & MGMT  03/28/2021   IR RADIOLOGIST EVAL & MGMT  05/15/2021   IR RADIOLOGIST EVAL & MGMT  09/28/2021   IR TRANSCATH/EMBOLIZ  04/25/2021   IR US GUIDE VASC ACCESS RIGHT  04/25/2021   MYRINGOTOMY WITH TUBE PLACEMENT Right 02/04/2017   Procedure: REVISION OF RIGHT MYRINGOTOMY WITH TUBE PLACEMENT, WITH EXAM OF LEFT EAR;  Surgeon: Leta Baptist, MD;  Location: [facility];  Service: ENT;  Laterality: Right;   NASAL SINUS SURGERY  2016   polypectomy   POLYPECTOMY  07/25/2020   Procedure: POLYPECTOMY;  Surgeon: Harvel Quale, MD;  [facility];  Service: Gastroenterology;;   POLYPECTOMY  08/29/2020    Procedure: POLYPECTOMY;  Surgeon: Harvel Quale, MD;  [facility];  Service: Gastroenterology;;   [facility] REMOVAL N/A 01/06/2019   Procedure: REMOVAL PORT-A-CATH;  Surgeon: Fanny Skates, MD;  Location: [facility];  Service: General;  Laterality: N/A;   PORTACATH PLACEMENT Right 06/16/2018   Procedure: INSERTION PORT-A-CATH;  Surgeon: Fanny Skates, MD;  Location: [place];  Service: General;  Laterality: Right;   RADIOLOGY WITH ANESTHESIA N/A 04/25/2021   Procedure: IR WITH ANESTHESIA EMBOLIZATION;  Surgeon: Luanne Bras, MD;  Location: [facility];  Service: Radiology;  Laterality: N/A;   SUBMUCOSAL LIFTING INJECTION  08/29/2020   Procedure: SUBMUCOSAL LIFTING INJECTION;  Surgeon: Montez Morita, Quillian Quince, MD;  [facility];  Service: Gastroenterology;;   TONSILLECTOMY  1970    Social History:  Social History   Socioeconomic History   Marital status: Legally Separated    Spouse name: Not on file   Number of children: 2   Years of education: Not on file   Highest education level: Not on file  Occupational History    Comment: retired  Tobacco Use   Smoking status: Never   Smokeless tobacco: Never  Vaping Use   Vaping Use: Never used  Substance and Sexual Activity   Alcohol use: No   Drug use: No   Sexual activity: Not Currently    Birth control/protection: Post-menopausal  Other Topics Concern   Not on file  Social History Narrative   One son lives with her   Social Determinants of Health   Financial Resource Strain: Low Risk  (01/08/2021)   Overall Financial Resource Strain (CARDIA)    Difficulty of Paying Living Expenses: Not hard at all  Food Insecurity: No Food Insecurity (01/08/2021)   Hunger Vital Sign    Worried About Running Out of Food in the Last Year: Never true    Ran Out of Food in the Last Year: Never true  Transportation Needs: No Transportation Needs (01/08/2021)   PRAPARE -  Hydrologist (Medical): No    Lack of Transportation (Non-Medical): No  Physical Activity: Inactive (01/08/2021)   Exercise Vital Sign    Days of Exercise per Week: 0 days    Minutes of Exercise per Session: 0 min  Stress: No Stress Concern Present (01/08/2021)   Harmony    Feeling of Stress : Not at all  Social Connections: Moderately Isolated (01/08/2021)   Social Connection and Isolation Panel [NHANES]    Frequency of Communication with Friends and Family: More than three times a week    Frequency of Social Gatherings with Friends and Family: More than three times a week    Attends Religious Services: More than 4 times per year    Active Member of [employer] or Organizations: No    Attends [employer]  or Organization Meetings: Never    Marital Status: Separated  Intimate Partner Violence: Not At Risk (01/08/2021)   Humiliation, Afraid, Rape, and Kick questionnaire    Fear of Current or Ex-Partner: No    Emotionally Abused: No    Physically Abused: No    Sexually Abused: No    Family History:  Family History  Problem Relation Age of Onset   Lupus Mother    Heart attack Father        age 34   Prostate cancer Father    Hypothyroidism Sister    Breast cancer Sister    Hypertension Sister    Breast cancer Sister    Hypertension Brother    Parkinson's disease Paternal Aunt    Stroke Neg Hx     Medications:   Current Outpatient Medications on File Prior to Visit  Medication Sig Dispense Refill   acyclovir (ZOVIRAX) 400 MG tablet Take 400 mg by mouth 2 (two) times daily.      alum & mag hydroxide-simeth (MAALOX PLUS) 400-400-40 MG/5ML suspension Take 15 mLs by mouth every 6 (six) hours as needed for indigestion. 355 mL 0   aspirin EC 81 MG EC tablet Take 1 tablet (81 mg total) by mouth daily. Swallow whole. 30 tablet 11   cetirizine (ZYRTEC) 10 MG tablet Take 10 mg by mouth daily as needed  for allergies.     Cholecalciferol (VITAMIN D) 2000 UNITS tablet Take 2,000 Units by mouth daily.     famotidine (PEPCID) 20 MG tablet Take 1 tablet (20 mg total) by mouth 2 (two) times daily. 10 tablet 0   ferrous sulfate 325 (65 FE) MG tablet Take 325 mg by mouth daily with breakfast.     metoprolol succinate (TOPROL-XL) 25 MG 24 hr tablet Take 25 mg by mouth daily.  3   omeprazole (PRILOSEC) 40 MG capsule Take 1 capsule (40 mg total) by mouth daily. (Patient taking differently: Take 40 mg by mouth daily as needed (Acid reflux).) 90 capsule 3   pramipexole (MIRAPEX) 0.5 MG tablet Take 1 tablet (0.5 mg total) by mouth 3 (three) times daily. 270 tablet 3   PROCTO-MED [facility] 2.5 % rectal cream Place 1 application. rectally 2 (two) times daily as needed for hemorrhoids.     rosuvastatin (CRESTOR) 20 MG tablet Take 1 tablet (20 mg total) by mouth daily. 90 tablet 3   zolpidem (AMBIEN) 10 MG tablet Take 5 mg by mouth at bedtime as needed for sleep.     No current facility-administered medications on file prior to visit.    Allergies:   Allergies  Allergen Reactions   Eggs Or Egg-Derived Products Nausea And Vomiting   Other Nausea And Vomiting    Dairy products\    Penicillins Hives    Has patient had a PCN reaction causing immediate rash, facial/tongue/throat swelling, SOB or lightheadedness with hypotension: No Has patient had a PCN reaction causing severe rash involving mucus membranes or skin necrosis: No Has patient had a PCN reaction that required hospitalization: No Has patient had a PCN reaction occurring within the last 10 years: No If all of the above answers are "NO", then may proceed with Cephalosporin use.       OBJECTIVE:  Physical Exam  There were no vitals filed for this visit. There is no height or weight on file to calculate BMI. No results found.   General: well developed, well nourished, seated, in no evident distress Head: head normocephalic and atraumatic.  Neck: supple with no carotid or supraclavicular bruits Cardiovascular: regular rate and rhythm, no murmurs Musculoskeletal: no deformity Skin:  no rash/petichiae Vascular:  Normal pulses all extremities   Neurologic Exam Mental Status: Awake and fully alert. Oriented to place and time. Recent and remote memory intact. Attention span, concentration and fund of knowledge appropriate. Mood and affect appropriate.  Cranial Nerves: Pupils equal, briskly reactive to light. Extraocular movements full without nystagmus. Visual fields full to confrontation. Hearing intact. Facial sensation intact. Face, tongue, palate moves normally and symmetrically.  Motor: Normal bulk and tone. Normal strength in all tested extremity muscles Sensory.: intact to touch , pinprick , position and vibratory sensation.  Coordination: Rapid alternating movements normal in all extremities. Finger-to-nose and heel-to-shin performed accurately bilaterally. Gait and Station: Arises from chair without difficulty. Stance is normal. Gait demonstrates normal stride length and balance without use of AD. Tandem walk and heel toe without difficulty.  Reflexes: 1+ and symmetric. Toes downgoing.         ASSESSMENT/PLAN: EUFRACIA HERNDON is a 72 y.o. year old female with left-sided predominant Parkinson's disease since 2021. Hx of right corona radiata infarct 12/2020 with incidental finding of cerebral aneurysm s/p flow diverter device 04/2021 with Dr. Estanislado Pandy, routinely followed by Dr. Estanislado Pandy.    1.  Parkinson disease  -Continue pramipexole  2. Hx of stroke       Follow up in *** or call earlier if needed   CC:  PCP: Burdine, Virgina Evener, MD    I spent *** minutes of face-to-face and non-face-to-face time with patient.  This included previsit chart review, lab review, study review, order entry, electronic health record documentation, patient education regarding ***   Frann Rider, AGNP-BC  [facility] [address] [place], [place] [address]  Phone [phone] Fax [phone] Note: This document was prepared with digital dictation and possible smart phrase technology. Any transcriptional errors that result from this process are unintentional.

## 2022-03-28 ENCOUNTER — Ambulatory Visit (INDEPENDENT_AMBULATORY_CARE_PROVIDER_SITE_OTHER): Payer: 59 | Admitting: Adult Health

## 2022-03-28 ENCOUNTER — Encounter: Payer: Self-pay | Admitting: Adult Health

## 2022-03-28 ENCOUNTER — Other Ambulatory Visit (INDEPENDENT_AMBULATORY_CARE_PROVIDER_SITE_OTHER): Payer: Self-pay | Admitting: Gastroenterology

## 2022-03-28 VITALS — BP 136/78 | HR 60 | Ht 63.0 in | Wt 126.0 lb

## 2022-03-28 DIAGNOSIS — G20A1 Parkinson's disease without dyskinesia, without mention of fluctuations: Secondary | ICD-10-CM | POA: Diagnosis not present

## 2022-03-28 DIAGNOSIS — G8929 Other chronic pain: Secondary | ICD-10-CM

## 2022-03-28 NOTE — Patient Instructions (Addendum)
Your Plan:  Continue pramipexole 0.5 mg 3 times daily  Recommend trying melatonin to help with sleep - can start at 1-'2mg'$  nightly and gradually increase as needed up to '10mg'$  nightly  Continue to stay active and ensure adequate hydration     Follow-up in 6 months or call earlier if needed      Thank you for coming to see [place] at [facility]. I hope we have been able to provide you high quality care today.  You may receive a patient satisfaction survey over the next few weeks. We would appreciate your feedback and comments so that we may continue to improve ourselves and the health of our patients.

## 2022-04-02 ENCOUNTER — Other Ambulatory Visit (HOSPITAL_COMMUNITY): Payer: Self-pay | Admitting: Interventional Radiology

## 2022-04-02 DIAGNOSIS — I671 Cerebral aneurysm, nonruptured: Secondary | ICD-10-CM

## 2022-04-03 ENCOUNTER — Encounter (INDEPENDENT_AMBULATORY_CARE_PROVIDER_SITE_OTHER): Payer: Self-pay | Admitting: Gastroenterology

## 2022-04-11 ENCOUNTER — Ambulatory Visit (HOSPITAL_COMMUNITY)
Admission: RE | Admit: 2022-04-11 | Discharge: 2022-04-11 | Disposition: A | Discharge status: Home / Self Care | Payer: 59 | Source: Ambulatory Visit | Attending: Interventional Radiology | Admitting: Interventional Radiology

## 2022-04-11 DIAGNOSIS — I671 Cerebral aneurysm, nonruptured: Secondary | ICD-10-CM | POA: Insufficient documentation

## 2022-04-12 DIAGNOSIS — K219 Gastro-esophageal reflux disease without esophagitis: Secondary | ICD-10-CM | POA: Diagnosis not present

## 2022-04-12 DIAGNOSIS — D509 Iron deficiency anemia, unspecified: Secondary | ICD-10-CM | POA: Diagnosis not present

## 2022-04-12 DIAGNOSIS — C50912 Malignant neoplasm of unspecified site of left female breast: Secondary | ICD-10-CM | POA: Diagnosis not present

## 2022-04-12 DIAGNOSIS — G20A1 Parkinson's disease without dyskinesia, without mention of fluctuations: Secondary | ICD-10-CM | POA: Diagnosis not present

## 2022-04-12 DIAGNOSIS — E559 Vitamin D deficiency, unspecified: Secondary | ICD-10-CM | POA: Diagnosis not present

## 2022-04-12 DIAGNOSIS — E7849 Other hyperlipidemia: Secondary | ICD-10-CM | POA: Diagnosis not present

## 2022-04-12 DIAGNOSIS — I671 Cerebral aneurysm, nonruptured: Secondary | ICD-10-CM | POA: Diagnosis not present

## 2022-04-12 DIAGNOSIS — Z0001 Encounter for general adult medical examination with abnormal findings: Secondary | ICD-10-CM | POA: Diagnosis not present

## 2022-04-12 DIAGNOSIS — I6930 Unspecified sequelae of cerebral infarction: Secondary | ICD-10-CM | POA: Diagnosis not present

## 2022-04-12 DIAGNOSIS — Z1389 Encounter for screening for other disorder: Secondary | ICD-10-CM | POA: Diagnosis not present

## 2022-04-12 DIAGNOSIS — I10 Essential (primary) hypertension: Secondary | ICD-10-CM | POA: Diagnosis not present

## 2022-04-16 ENCOUNTER — Telehealth (HOSPITAL_COMMUNITY): Payer: Self-pay

## 2022-04-16 NOTE — Telephone Encounter (Signed)
Called pt regarding recent imaging, no answer, no vm. AB

## 2022-04-25 ENCOUNTER — Other Ambulatory Visit: Payer: Self-pay | Admitting: Cardiology

## 2022-05-07 DIAGNOSIS — Z6822 Body mass index (BMI) 22.0-22.9, adult: Secondary | ICD-10-CM | POA: Diagnosis not present

## 2022-05-07 DIAGNOSIS — R300 Dysuria: Secondary | ICD-10-CM | POA: Diagnosis not present

## 2022-05-09 ENCOUNTER — Telehealth (HOSPITAL_COMMUNITY): Payer: Self-pay

## 2022-05-09 NOTE — Telephone Encounter (Signed)
Pt agreed to f/u in 6 months with an mra. AB

## 2022-05-22 ENCOUNTER — Telehealth (HOSPITAL_COMMUNITY): Payer: Self-pay

## 2022-05-22 ENCOUNTER — Other Ambulatory Visit (HOSPITAL_COMMUNITY): Payer: Self-pay | Admitting: Interventional Radiology

## 2022-05-22 DIAGNOSIS — I671 Cerebral aneurysm, nonruptured: Secondary | ICD-10-CM

## 2022-05-22 DIAGNOSIS — R519 Headache, unspecified: Secondary | ICD-10-CM

## 2022-05-22 NOTE — Telephone Encounter (Signed)
Called to schedule consult, no answer, no vm. AB

## 2022-05-30 ENCOUNTER — Ambulatory Visit (INDEPENDENT_AMBULATORY_CARE_PROVIDER_SITE_OTHER): Payer: Medicare Other | Admitting: Gastroenterology

## 2022-05-31 ENCOUNTER — Ambulatory Visit (HOSPITAL_COMMUNITY)
Admission: RE | Admit: 2022-05-31 | Discharge: 2022-05-31 | Disposition: A | Discharge status: Home / Self Care | Payer: 59 | Source: Ambulatory Visit | Attending: Interventional Radiology | Admitting: Interventional Radiology

## 2022-06-10 ENCOUNTER — Telehealth (HOSPITAL_COMMUNITY): Payer: Self-pay

## 2022-06-10 NOTE — Telephone Encounter (Signed)
Called to reschedule consult, family answered and would like for me to try back tomorrow, will try back tomorrow. AB

## 2022-06-19 DIAGNOSIS — R053 Chronic cough: Secondary | ICD-10-CM | POA: Diagnosis not present

## 2022-06-19 DIAGNOSIS — Z6823 Body mass index (BMI) 23.0-23.9, adult: Secondary | ICD-10-CM | POA: Diagnosis not present

## 2022-06-19 DIAGNOSIS — J209 Acute bronchitis, unspecified: Secondary | ICD-10-CM | POA: Diagnosis not present

## 2022-06-20 ENCOUNTER — Telehealth (HOSPITAL_COMMUNITY): Payer: Self-pay

## 2022-06-20 NOTE — Telephone Encounter (Signed)
Called to reschedule consult, no answer, left vm. AB  

## 2022-07-01 ENCOUNTER — Encounter (INDEPENDENT_AMBULATORY_CARE_PROVIDER_SITE_OTHER): Payer: Self-pay | Admitting: Gastroenterology

## 2022-07-01 ENCOUNTER — Ambulatory Visit (INDEPENDENT_AMBULATORY_CARE_PROVIDER_SITE_OTHER): Payer: 59 | Admitting: Gastroenterology

## 2022-07-01 VITALS — BP 160/102 | HR 59 | Temp 97.1°F | Ht 64.0 in | Wt 131.0 lb

## 2022-07-01 DIAGNOSIS — D132 Benign neoplasm of duodenum: Secondary | ICD-10-CM

## 2022-07-01 DIAGNOSIS — K30 Functional dyspepsia: Secondary | ICD-10-CM | POA: Diagnosis not present

## 2022-07-01 NOTE — Progress Notes (Signed)
Katrinka Blazing, M.D. Gastroenterology & Hepatology [facility] Gastroenterology [address] [place], [place] [address]  Primary Care Physician: Juliette Alcide, MD [address] [place] [address]  I will communicate my assessment and recommendations to the referring MD via EMR.  Problems: Chronic abdominal pain/nausea Duodenal adenoma  History of Present Illness: Kimberly Mccormick is a 72 y.o. female with PMH breast cancer s/p , CHF, IBS, hyperlipidemia, stroke, L internal carotid aneurysm s/p embolization, hypertension, who presents for follow-up of RUQ pain.   The patient was last seen on 05/28/2021. At that time, the patient was given Zofran for management of nausea.  She was advised to have a repeat EGD in October 2023 for surveillance of duodenal adenoma but this was never scheduled.  Patient reports having " a regular" pain in the RUQ area every time she has drank any cold drink. The pain lasts for a few minutes. The pain is 7/10 in severity. She states that when she takes omeprazole, the pain gets better, but only takes it only if strictly necessary due to concerns of side effects with long term use. Sometimes she has some nausea.  The patient denies having any vomiting, fever, chills, hematochezia, melena, hematemesis, abdominal distention, diarrhea, jaundice, pruritus. Has gained a couple of pounds recently.  Last EGD: 08/29/2020, nonobstructive Schatzki's ring at the GE junction, there was a 2 cm hiatal hernia, normal stomach, and 15 mm flat polyp was found in the duodenal bulb.  This was injected for endoscopic mucosal resection which was removed in a piecemeal fashion with) jumbo forceps without bleeding after resection.  Pathology was consistent with a duodenal adenoma.  Random biopsies of the small bowel were negative for any pathology.  Patient was advised to repeat EGD in 1 year.   Path: A. DUODENUM, BULB, POLYPECTOMY:  - Multiple fragments of  duodenal adenoma.  - No high-grade dysplasia or malignancy.    B. SMALL BOWEL, BIOPSY:  - Benign small bowel mucosa.  - No villous blunting or increase in intraepithelial lymphocytes.  - No dysplasia or malignancy.    Recommended repeat EGD in 08/2021.   Last Colonoscopy: 07/2020 - Hemorrhoids found on perianal exam. - Two 4 to 10 mm polyps in the ascending colon, removed with a cold snare. Resected and retrieved. - Non-bleeding internal hemorrhoids.   Advised to repeat in 7 years.  Past Medical History: Past Medical History:  Diagnosis Date   Aneurysm of left internal carotid artery    Small supraclinoid 5 mm   Breast cancer (HCC)    Left s/p lumpectomy (05/06/2018) and adjuvant chemotherapy (06/23/2018) with Adriamycin and Cytoxan x4 followed by Taxol weekly x12   DDD (degenerative disc disease), lumbar    Essential hypertension    Genital warts    GERD (gastroesophageal reflux disease)    History of anemia    History of renal insufficiency    Hypertension    Insomnia    Iron deficiency anemia    Ischemic stroke [facility])    December 2022   Mixed hyperlipidemia    Osteoarthritis    Parkinson's disease    Peripheral neuropathy    Related to chemotherapy    Past Surgical History: Past Surgical History:  Procedure Laterality Date   APPENDECTOMY  1966   BIOPSY  08/29/2020   Procedure: BIOPSY;  Surgeon: Dolores Frame, MD;  [facility]: [facility];  Service: Gastroenterology;;   BREAST LUMPECTOMY WITH RADIOACTIVE SEED AND SENTINEL LYMPH NODE BIOPSY  Left 05/06/2018   Procedure: LEFT BREAST LUMPECTOMY WITH RADIOACTIVE SEED AND LEFT DEEP AXILLARY SENTINEL LYMPH NODE BIOPSY AND BLUE DYE INJECTION;  Surgeon: Claud Kelp, MD;  Location: [facility];  Service: General;  Laterality: Left;   CATARACT EXTRACTION W/PHACO Left 01/10/2014   Procedure: CATARACT EXTRACTION PHACO AND INTRAOCULAR LENS PLACEMENT ; CDE:  4.94;  Surgeon: Susa Simmonds, MD;  Location:  [facility];  Service: Ophthalmology;  Laterality: Left;   CESAREAN SECTION  1986   CHOLECYSTECTOMY     COLONOSCOPY WITH PROPOFOL N/A 07/25/2020   Procedure: COLONOSCOPY WITH PROPOFOL;  Surgeon: Dolores Frame, MD;  [facility];  Service: Gastroenterology;  Laterality: N/A;  10:55   ESOPHAGOGASTRODUODENOSCOPY (EGD) WITH PROPOFOL N/A 08/29/2020   Procedure: ESOPHAGOGASTRODUODENOSCOPY (EGD) WITH PROPOFOL;  Surgeon: Dolores Frame, MD;  [facility];  Service: Gastroenterology;  Laterality: N/A;  12:00   IR 3D INDEPENDENT WKST  04/25/2021   IR ANGIO INTRA EXTRACRAN SEL COM CAROTID INNOMINATE UNI R MOD SED  04/25/2021   IR ANGIO INTRA EXTRACRAN SEL INTERNAL CAROTID UNI L MOD SED  04/25/2021   IR ANGIOGRAM FOLLOW UP STUDY  04/25/2021   IR CT HEAD LTD  04/25/2021   IR NEURO EACH ADD'L AFTER BASIC UNI LEFT (MS)  04/25/2021   IR RADIOLOGIST EVAL & MGMT  03/22/2021   IR RADIOLOGIST EVAL & MGMT  03/28/2021   IR RADIOLOGIST EVAL & MGMT  05/15/2021   IR RADIOLOGIST EVAL & MGMT  09/28/2021   IR TRANSCATH/EMBOLIZ  04/25/2021   IR US GUIDE VASC ACCESS RIGHT  04/25/2021   MYRINGOTOMY WITH TUBE PLACEMENT Right 02/04/2017   Procedure: REVISION OF RIGHT MYRINGOTOMY WITH TUBE PLACEMENT, WITH EXAM OF LEFT EAR;  Surgeon: Newman Pies, MD;  Location: [facility];  Service: ENT;  Laterality: Right;   NASAL SINUS SURGERY  2016   polypectomy   POLYPECTOMY  07/25/2020   Procedure: POLYPECTOMY;  Surgeon: Dolores Frame, MD;  [facility];  Service: Gastroenterology;;   POLYPECTOMY  08/29/2020   Procedure: POLYPECTOMY;  Surgeon: Dolores Frame, MD;  [facility];  Service: Gastroenterology;;   [facility] REMOVAL N/A 01/06/2019   Procedure: REMOVAL PORT-A-CATH;  Surgeon: Claud Kelp, MD;  Location: [facility];  Service: General;  Laterality: N/A;   PORTACATH PLACEMENT Right 06/16/2018   Procedure: INSERTION PORT-A-CATH;  Surgeon:  Claud Kelp, MD;  Location:  SURGERY CENTER;  Service: General;  Laterality: Right;   RADIOLOGY WITH ANESTHESIA N/A 04/25/2021   Procedure: IR WITH ANESTHESIA EMBOLIZATION;  Surgeon: Julieanne Cotton, MD;  Location: [facility];  Service: Radiology;  Laterality: N/A;   SUBMUCOSAL LIFTING INJECTION  08/29/2020   Procedure: SUBMUCOSAL LIFTING INJECTION;  Surgeon: Marguerita Merles, Reuel Boom, MD;  Location: [facility];  Service: Gastroenterology;;   TONSILLECTOMY  1970    Family History: Family History  Problem Relation Age of Onset   Lupus Mother    Heart attack Father        age 76   Prostate cancer Father    Hypothyroidism Sister    Breast cancer Sister    Hypertension Sister    Breast cancer Sister    Hypertension Brother    Parkinson's disease Paternal Aunt    Stroke Neg Hx     Social History: Social History   Tobacco Use  Smoking Status Never  Smokeless Tobacco Never   Social History   Substance and Sexual Activity  Alcohol Use  No   Social History   Substance and Sexual Activity  Drug Use No    Allergies: Allergies  Allergen Reactions   Egg-Derived Products Nausea And Vomiting   Other Nausea And Vomiting    Dairy products\    Penicillins Hives    Has patient had a PCN reaction causing immediate rash, facial/tongue/throat swelling, SOB or lightheadedness with hypotension: No Has patient had a PCN reaction causing severe rash involving mucus membranes or skin necrosis: No Has patient had a PCN reaction that required hospitalization: No Has patient had a PCN reaction occurring within the last 10 years: No If all of the above answers are "NO", then may proceed with Cephalosporin use.     Medications: Current Outpatient Medications  Medication Sig Dispense Refill   acyclovir (ZOVIRAX) 400 MG tablet Take 400 mg by mouth 2 (two) times daily.      alum & mag hydroxide-simeth (MAALOX PLUS) 400-400-40 MG/5ML suspension Take 15 mLs by mouth every 6 (six)  hours as needed for indigestion. 355 mL 0   aspirin EC 81 MG EC tablet Take 1 tablet (81 mg total) by mouth daily. Swallow whole. 30 tablet 11   benzonatate (TESSALON) 100 MG capsule Take by mouth 3 (three) times daily.     cetirizine (ZYRTEC) 10 MG tablet Take 10 mg by mouth daily as needed for allergies.     metoprolol succinate (TOPROL-XL) 25 MG 24 hr tablet Take 25 mg by mouth daily.  3   omeprazole (PRILOSEC) 40 MG capsule TAKE 1 CAPSULE BY MOUTH DAILY 90 capsule 0   pramipexole (MIRAPEX) 0.5 MG tablet Take 1 tablet (0.5 mg total) by mouth 3 (three) times daily. 270 tablet 3   PROCTO-MED HC 2.5 % rectal cream Place 1 application. rectally 2 (two) times daily as needed for hemorrhoids.     zolpidem (AMBIEN) 10 MG tablet Take 5 mg by mouth at bedtime as needed for sleep.     ferrous sulfate 325 (65 FE) MG tablet Take 325 mg by mouth daily with breakfast. (Patient not taking: Reported on 07/01/2022)     No current facility-administered medications for this visit.    Review of Systems: GENERAL: negative for malaise, night sweats HEENT: No changes in hearing or vision, no nose bleeds or other nasal problems. NECK: Negative for lumps, goiter, pain and significant neck swelling RESPIRATORY: Negative for cough, wheezing CARDIOVASCULAR: Negative for chest pain, leg swelling, palpitations, orthopnea GI: SEE HPI MUSCULOSKELETAL: Negative for joint pain or swelling, back pain, and muscle pain. SKIN: Negative for lesions, rash PSYCH: Negative for sleep disturbance, mood disorder and recent psychosocial stressors. HEMATOLOGY Negative for prolonged bleeding, bruising easily, and swollen nodes. ENDOCRINE: Negative for cold or heat intolerance, polyuria, polydipsia and goiter. NEURO: negative for tremor, gait imbalance, syncope and seizures. The remainder of the review of systems is noncontributory.   Physical Exam: BP (!) 160/102 (BP Location: Left Arm, Patient Position: Sitting, Cuff Size:  Normal)   Pulse (!) 59   Temp (!) 97.1 F (36.2 C) (Temporal)   Ht 5\' 4"  (1.626 m)   Wt 131 lb (59.4 kg)   BMI 22.49 kg/m  GENERAL: The patient is AO x3, in no acute distress. HEENT: Head is normocephalic and atraumatic. EOMI are intact. Mouth is well hydrated and without lesions. NECK: Supple. No masses LUNGS: Clear to auscultation. No presence of rhonchi/wheezing/rales. Adequate chest expansion HEART: RRR, normal s1 and s2. ABDOMEN: mildly tender upon palpation of the right upper quadrant, no guarding, no  peritoneal signs, and nondistended. BS +. No masses. EXTREMITIES: Without any cyanosis, clubbing, rash, lesions or edema. NEUROLOGIC: AOx3, no focal motor deficit. SKIN: no jaundice, no rashes  Imaging/Labs: as above  I personally reviewed and interpreted the available labs, imaging and endoscopic files.  Impression and Plan: Kimberly Mccormick is a 72 y.o. female with PMH breast cancer s/p , CHF, IBS, hyperlipidemia, stroke, L internal carotid aneurysm s/p embolization, hypertension, who presents for follow-up of RUQ pain.  The patient has presented a chronic history of abdominal pain and nausea, for which she has undergone extensive investigations including cross-sectional abdominal imaging and gastric emptying study which have been unremarkable.  She had an EGD in 2022 that did not show any abnormalities explaining her pain.  I do consider that part of her symptoms are related to functional dyspepsia which has previously responded to the use of omeprazole.  Patient was re-assured regarding PPI use and safety of when appropriate indications in question. Most recent studies on PPI therapy that association of symptoms is not equivalent to causation and overall association with for example osteoporosis is weak and based on observational studies. When PPI use is indicated, it is safe to proceed with therapy and titrate dosing/use based on symptom response.  In her case, I have recommended to  continue with the current dose of omeprazole 40 mg every day but once she runs out of her medication will decrease the dosage to 20 mg daily.  She is agreeable to proceed with this.  The patient is due for surveillance of her duodenal adenoma.  An EGD will be scheduled, which will also help [place] evaluate her upper gastrointestinal tract given the fact that her pain has migrated from the epigastric area to the right upper quadrant recently.  - Restart omeprazole 40 mg qday, once you're almost done with current refill please notify us to decrease dosage to 20 mg qday - Schedule EGD  All questions were answered.      Katrinka Blazing, MD Gastroenterology and Hepatology [facility] Gastroenterology

## 2022-07-01 NOTE — Patient Instructions (Signed)
Restart omeprazole 40 mg qday, once you're almost done with current refill please notify us to decrease dosage to 20 mg qday Schedule EGD

## 2022-07-01 NOTE — H&P (View-Only) (Signed)
Elfreda Blanchet Castaneda, M.D. Gastroenterology & Hepatology [facility] [address] [place], [place] [address]  Primary Care Physician: Burdine, Steven E, MD [address] [place] [address]  I will communicate my assessment and recommendations to the referring MD via EMR.  Problems: Chronic abdominal pain/nausea Duodenal adenoma  History of Present Illness: Florinda W Polgar is a 72 y.o. female with PMH breast cancer s/p , CHF, IBS, hyperlipidemia, stroke, L internal carotid aneurysm s/p embolization, hypertension, who presents for follow-up of RUQ pain.   The patient was last seen on 05/28/2021. At that time, the patient was given Zofran for management of nausea.  She was advised to have a repeat EGD in October 2023 for surveillance of duodenal adenoma but this was never scheduled.  Patient reports having " a regular" pain in the RUQ area every time she has drank any cold drink. The pain lasts for a few minutes. The pain is 7/10 in severity. She states that when she takes omeprazole, the pain gets better, but only takes it only if strictly necessary due to concerns of side effects with long term use. Sometimes she has some nausea.  The patient denies having any vomiting, fever, chills, hematochezia, melena, hematemesis, abdominal distention, diarrhea, jaundice, pruritus. Has gained a couple of pounds recently.  Last EGD: 08/29/2020, nonobstructive Schatzki's ring at the GE junction, there was a 2 cm hiatal hernia, normal stomach, and 15 mm flat polyp was found in the duodenal bulb.  This was injected for endoscopic mucosal resection which was removed in a piecemeal fashion with) jumbo forceps without bleeding after resection.  Pathology was consistent with a duodenal adenoma.  Random biopsies of the small bowel were negative for any pathology.  Patient was advised to repeat EGD in 1 year.   Path: A. DUODENUM, BULB, POLYPECTOMY:  - Multiple fragments of  duodenal adenoma.  - No high-grade dysplasia or malignancy.    B. SMALL BOWEL, BIOPSY:  - Benign small bowel mucosa.  - No villous blunting or increase in intraepithelial lymphocytes.  - No dysplasia or malignancy.    Recommended repeat EGD in 08/2021.   Last Colonoscopy: 07/2020 - Hemorrhoids found on perianal exam. - Two 4 to 10 mm polyps in the ascending colon, removed with a cold snare. Resected and retrieved. - Non-bleeding internal hemorrhoids.   Advised to repeat in 7 years.  Past Medical History: Past Medical History:  Diagnosis Date   Aneurysm of left internal carotid artery    Small supraclinoid 5 mm   Breast cancer (HCC)    Left s/p lumpectomy (05/06/2018) and adjuvant chemotherapy (06/23/2018) with Adriamycin and Cytoxan x4 followed by Taxol weekly x12   DDD (degenerative disc disease), lumbar    Essential hypertension    Genital warts    GERD (gastroesophageal reflux disease)    History of anemia    History of renal insufficiency    Hypertension    Insomnia    Iron deficiency anemia    Ischemic stroke (HCC)    December 2022   Mixed hyperlipidemia    Osteoarthritis    Parkinson's disease    Peripheral neuropathy    Related to chemotherapy    Past Surgical History: Past Surgical History:  Procedure Laterality Date   APPENDECTOMY  1966   BIOPSY  08/29/2020   Procedure: BIOPSY;  Surgeon: Castaneda Mayorga, Jasa Dundon, MD;  Location: [facility];  Service: Gastroenterology;;   BREAST LUMPECTOMY WITH RADIOACTIVE SEED AND SENTINEL LYMPH NODE BIOPSY   Left 05/06/2018   Procedure: LEFT BREAST LUMPECTOMY WITH RADIOACTIVE SEED AND LEFT DEEP AXILLARY SENTINEL LYMPH NODE BIOPSY AND BLUE DYE INJECTION;  Surgeon: Ingram, Haywood, MD;  Location: [facility];  Service: General;  Laterality: Left;   CATARACT EXTRACTION W/PHACO Left 01/10/2014   Procedure: CATARACT EXTRACTION PHACO AND INTRAOCULAR LENS PLACEMENT ; CDE:  4.94;  Surgeon: Carroll F Haines, MD;  Location:  [facility];  Service: Ophthalmology;  Laterality: Left;   CESAREAN SECTION  1986   CHOLECYSTECTOMY     COLONOSCOPY WITH PROPOFOL N/A 07/25/2020   Procedure: COLONOSCOPY WITH PROPOFOL;  Surgeon: Castaneda Mayorga, Antonela Freiman, MD;  [facility]: [facility];  Service: Gastroenterology;  Laterality: N/A;  10:55   ESOPHAGOGASTRODUODENOSCOPY (EGD) WITH PROPOFOL N/A 08/29/2020   Procedure: ESOPHAGOGASTRODUODENOSCOPY (EGD) WITH PROPOFOL;  Surgeon: Castaneda Mayorga, Tyan Dy, MD;  [facility]: [facility];  Service: Gastroenterology;  Laterality: N/A;  12:00   IR 3D INDEPENDENT WKST  04/25/2021   IR ANGIO INTRA EXTRACRAN SEL COM CAROTID INNOMINATE UNI R MOD SED  04/25/2021   IR ANGIO INTRA EXTRACRAN SEL INTERNAL CAROTID UNI L MOD SED  04/25/2021   IR ANGIOGRAM FOLLOW UP STUDY  04/25/2021   IR CT HEAD LTD  04/25/2021   IR NEURO EACH ADD'L AFTER BASIC UNI LEFT (MS)  04/25/2021   IR RADIOLOGIST EVAL & MGMT  03/22/2021   IR RADIOLOGIST EVAL & MGMT  03/28/2021   IR RADIOLOGIST EVAL & MGMT  05/15/2021   IR RADIOLOGIST EVAL & MGMT  09/28/2021   IR TRANSCATH/EMBOLIZ  04/25/2021   IR US GUIDE VASC ACCESS RIGHT  04/25/2021   MYRINGOTOMY WITH TUBE PLACEMENT Right 02/04/2017   Procedure: REVISION OF RIGHT MYRINGOTOMY WITH TUBE PLACEMENT, WITH EXAM OF LEFT EAR;  Surgeon: Teoh, Su, MD;  Location: [facility];  Service: ENT;  Laterality: Right;   NASAL SINUS SURGERY  2016   polypectomy   POLYPECTOMY  07/25/2020   Procedure: POLYPECTOMY;  Surgeon: Castaneda Mayorga, Mahiya Kercheval, MD;  [facility]: [facility];  Service: Gastroenterology;;   POLYPECTOMY  08/29/2020   Procedure: POLYPECTOMY;  Surgeon: Castaneda Mayorga, Amanpreet Delmont, MD;  [facility]: [facility];  Service: Gastroenterology;;   PORT-A-CATH REMOVAL N/A 01/06/2019   Procedure: REMOVAL PORT-A-CATH;  Surgeon: Ingram, Haywood, MD;  Location: [facility];  Service: General;  Laterality: N/A;   PORTACATH PLACEMENT Right 06/16/2018   Procedure: INSERTION PORT-A-CATH;  Surgeon:  Ingram, Haywood, MD;  Location: [facility];  Service: General;  Laterality: Right;   RADIOLOGY WITH ANESTHESIA N/A 04/25/2021   Procedure: IR WITH ANESTHESIA EMBOLIZATION;  Surgeon: Deveshwar, Sanjeev, MD;  Location: [facility];  Service: Radiology;  Laterality: N/A;   SUBMUCOSAL LIFTING INJECTION  08/29/2020   Procedure: SUBMUCOSAL LIFTING INJECTION;  Surgeon: Castaneda Mayorga, Simcha Speir, MD;  Location: [facility];  Service: Gastroenterology;;   TONSILLECTOMY  1970    Family History: Family History  Problem Relation Age of Onset   Lupus Mother    Heart attack Father        age 81   Prostate cancer Father    Hypothyroidism Sister    Breast cancer Sister    Hypertension Sister    Breast cancer Sister    Hypertension Brother    Parkinson's disease Paternal Aunt    Stroke Neg Hx     Social History: Social History   Tobacco Use  Smoking Status Never  Smokeless Tobacco Never   Social History   Substance and Sexual Activity  Alcohol Use   No   Social History   Substance and Sexual Activity  Drug Use No    Allergies: Allergies  Allergen Reactions   Egg-Derived Products Nausea And Vomiting   Other Nausea And Vomiting    Dairy products\    Penicillins Hives    Has patient had a PCN reaction causing immediate rash, facial/tongue/throat swelling, SOB or lightheadedness with hypotension: No Has patient had a PCN reaction causing severe rash involving mucus membranes or skin necrosis: No Has patient had a PCN reaction that required hospitalization: No Has patient had a PCN reaction occurring within the last 10 years: No If all of the above answers are "NO", then may proceed with Cephalosporin use.     Medications: Current Outpatient Medications  Medication Sig Dispense Refill   acyclovir (ZOVIRAX) 400 MG tablet Take 400 mg by mouth 2 (two) times daily.      alum & mag hydroxide-simeth (MAALOX PLUS) 400-400-40 MG/5ML suspension Take 15 mLs by mouth every 6 (six)  hours as needed for indigestion. 355 mL 0   aspirin EC 81 MG EC tablet Take 1 tablet (81 mg total) by mouth daily. Swallow whole. 30 tablet 11   benzonatate (TESSALON) 100 MG capsule Take by mouth 3 (three) times daily.     cetirizine (ZYRTEC) 10 MG tablet Take 10 mg by mouth daily as needed for allergies.     metoprolol succinate (TOPROL-XL) 25 MG 24 hr tablet Take 25 mg by mouth daily.  3   omeprazole (PRILOSEC) 40 MG capsule TAKE 1 CAPSULE BY MOUTH DAILY 90 capsule 0   pramipexole (MIRAPEX) 0.5 MG tablet Take 1 tablet (0.5 mg total) by mouth 3 (three) times daily. 270 tablet 3   PROCTO-MED HC 2.5 % rectal cream Place 1 application. rectally 2 (two) times daily as needed for hemorrhoids.     zolpidem (AMBIEN) 10 MG tablet Take 5 mg by mouth at bedtime as needed for sleep.     ferrous sulfate 325 (65 FE) MG tablet Take 325 mg by mouth daily with breakfast. (Patient not taking: Reported on 07/01/2022)     No current facility-administered medications for this visit.    Review of Systems: GENERAL: negative for malaise, night sweats HEENT: No changes in hearing or vision, no nose bleeds or other nasal problems. NECK: Negative for lumps, goiter, pain and significant neck swelling RESPIRATORY: Negative for cough, wheezing CARDIOVASCULAR: Negative for chest pain, leg swelling, palpitations, orthopnea GI: SEE HPI MUSCULOSKELETAL: Negative for joint pain or swelling, back pain, and muscle pain. SKIN: Negative for lesions, rash PSYCH: Negative for sleep disturbance, mood disorder and recent psychosocial stressors. HEMATOLOGY Negative for prolonged bleeding, bruising easily, and swollen nodes. ENDOCRINE: Negative for cold or heat intolerance, polyuria, polydipsia and goiter. NEURO: negative for tremor, gait imbalance, syncope and seizures. The remainder of the review of systems is noncontributory.   Physical Exam: BP (!) 160/102 (BP Location: Left Arm, Patient Position: Sitting, Cuff Size:  Normal)   Pulse (!) 59   Temp (!) 97.1 F (36.2 C) (Temporal)   Ht 5' 4" (1.626 m)   Wt 131 lb (59.4 kg)   BMI 22.49 kg/m  GENERAL: The patient is AO x3, in no acute distress. HEENT: Head is normocephalic and atraumatic. EOMI are intact. Mouth is well hydrated and without lesions. NECK: Supple. No masses LUNGS: Clear to auscultation. No presence of rhonchi/wheezing/rales. Adequate chest expansion HEART: RRR, normal s1 and s2. ABDOMEN: mildly tender upon palpation of the right upper quadrant, no guarding, no   peritoneal signs, and nondistended. BS +. No masses. EXTREMITIES: Without any cyanosis, clubbing, rash, lesions or edema. NEUROLOGIC: AOx3, no focal motor deficit. SKIN: no jaundice, no rashes  Imaging/Labs: as above  I personally reviewed and interpreted the available labs, imaging and endoscopic files.  Impression and Plan: Marleta W Docter is a 72 y.o. female with PMH breast cancer s/p , CHF, IBS, hyperlipidemia, stroke, L internal carotid aneurysm s/p embolization, hypertension, who presents for follow-up of RUQ pain.  The patient has presented a chronic history of abdominal pain and nausea, for which she has undergone extensive investigations including cross-sectional abdominal imaging and gastric emptying study which have been unremarkable.  She had an EGD in 2022 that did not show any abnormalities explaining her pain.  I do consider that part of her symptoms are related to functional dyspepsia which has previously responded to the use of omeprazole.  Patient was re-assured regarding PPI use and safety of when appropriate indications in question. Most recent studies on PPI therapy that association of symptoms is not equivalent to causation and overall association with for example osteoporosis is weak and based on observational studies. When PPI use is indicated, it is safe to proceed with therapy and titrate dosing/use based on symptom response.  In her case, I have recommended to  continue with the current dose of omeprazole 40 mg every day but once she runs out of her medication will decrease the dosage to 20 mg daily.  She is agreeable to proceed with this.  The patient is due for surveillance of her duodenal adenoma.  An EGD will be scheduled, which will also help us evaluate her upper gastrointestinal tract given the fact that her pain has migrated from the epigastric area to the right upper quadrant recently.  - Restart omeprazole 40 mg qday, once you're almost done with current refill please notify us to decrease dosage to 20 mg qday - Schedule EGD  All questions were answered.      Kayelyn Lemon Castaneda, MD Gastroenterology and Hepatology [facility]  

## 2022-07-10 ENCOUNTER — Other Ambulatory Visit (INDEPENDENT_AMBULATORY_CARE_PROVIDER_SITE_OTHER): Payer: Self-pay | Admitting: Gastroenterology

## 2022-07-10 DIAGNOSIS — G8929 Other chronic pain: Secondary | ICD-10-CM

## 2022-07-16 DIAGNOSIS — T17920A Food in respiratory tract, part unspecified causing asphyxiation, initial encounter: Secondary | ICD-10-CM | POA: Diagnosis not present

## 2022-07-16 DIAGNOSIS — G20A1 Parkinson's disease without dyskinesia, without mention of fluctuations: Secondary | ICD-10-CM | POA: Diagnosis not present

## 2022-07-16 DIAGNOSIS — D509 Iron deficiency anemia, unspecified: Secondary | ICD-10-CM | POA: Diagnosis not present

## 2022-07-16 DIAGNOSIS — K219 Gastro-esophageal reflux disease without esophagitis: Secondary | ICD-10-CM | POA: Diagnosis not present

## 2022-07-16 DIAGNOSIS — C50912 Malignant neoplasm of unspecified site of left female breast: Secondary | ICD-10-CM | POA: Diagnosis not present

## 2022-07-16 DIAGNOSIS — I671 Cerebral aneurysm, nonruptured: Secondary | ICD-10-CM | POA: Diagnosis not present

## 2022-07-16 DIAGNOSIS — R300 Dysuria: Secondary | ICD-10-CM | POA: Diagnosis not present

## 2022-07-18 DIAGNOSIS — D2371 Other benign neoplasm of skin of right lower limb, including hip: Secondary | ICD-10-CM | POA: Diagnosis not present

## 2022-07-18 DIAGNOSIS — D2372 Other benign neoplasm of skin of left lower limb, including hip: Secondary | ICD-10-CM | POA: Diagnosis not present

## 2022-07-18 DIAGNOSIS — M79676 Pain in unspecified toe(s): Secondary | ICD-10-CM | POA: Diagnosis not present

## 2022-07-22 DIAGNOSIS — T63461A Toxic effect of venom of wasps, accidental (unintentional), initial encounter: Secondary | ICD-10-CM | POA: Diagnosis not present

## 2022-07-22 DIAGNOSIS — S61451A Open bite of right hand, initial encounter: Secondary | ICD-10-CM | POA: Diagnosis not present

## 2022-07-22 DIAGNOSIS — Z853 Personal history of malignant neoplasm of breast: Secondary | ICD-10-CM | POA: Diagnosis not present

## 2022-07-22 DIAGNOSIS — G20A1 Parkinson's disease without dyskinesia, without mention of fluctuations: Secondary | ICD-10-CM | POA: Diagnosis not present

## 2022-07-22 DIAGNOSIS — W57XXXA Bitten or stung by nonvenomous insect and other nonvenomous arthropods, initial encounter: Secondary | ICD-10-CM | POA: Diagnosis not present

## 2022-07-24 ENCOUNTER — Ambulatory Visit (HOSPITAL_COMMUNITY): Payer: 59 | Admitting: Anesthesiology

## 2022-07-24 ENCOUNTER — Telehealth (INDEPENDENT_AMBULATORY_CARE_PROVIDER_SITE_OTHER): Payer: Self-pay | Admitting: Gastroenterology

## 2022-07-24 ENCOUNTER — Ambulatory Visit (HOSPITAL_COMMUNITY)
Admission: RE | Admit: 2022-07-24 | Discharge: 2022-07-24 | Disposition: A | Discharge status: Home / Self Care | Payer: 59 | Attending: Gastroenterology | Admitting: Gastroenterology

## 2022-07-24 ENCOUNTER — Encounter (HOSPITAL_COMMUNITY)
Admission: RE | Disposition: A | Discharge status: Home / Self Care | Payer: Self-pay | Source: Home / Self Care | Attending: Gastroenterology

## 2022-07-24 ENCOUNTER — Other Ambulatory Visit: Payer: Self-pay

## 2022-07-24 ENCOUNTER — Encounter (HOSPITAL_COMMUNITY): Payer: Self-pay | Admitting: Gastroenterology

## 2022-07-24 DIAGNOSIS — I509 Heart failure, unspecified: Secondary | ICD-10-CM | POA: Diagnosis not present

## 2022-07-24 DIAGNOSIS — Z8673 Personal history of transient ischemic attack (TIA), and cerebral infarction without residual deficits: Secondary | ICD-10-CM | POA: Insufficient documentation

## 2022-07-24 DIAGNOSIS — Z853 Personal history of malignant neoplasm of breast: Secondary | ICD-10-CM | POA: Diagnosis not present

## 2022-07-24 DIAGNOSIS — R1011 Right upper quadrant pain: Secondary | ICD-10-CM | POA: Diagnosis not present

## 2022-07-24 DIAGNOSIS — Z79899 Other long term (current) drug therapy: Secondary | ICD-10-CM | POA: Insufficient documentation

## 2022-07-24 DIAGNOSIS — I671 Cerebral aneurysm, nonruptured: Secondary | ICD-10-CM | POA: Diagnosis not present

## 2022-07-24 DIAGNOSIS — K449 Diaphragmatic hernia without obstruction or gangrene: Secondary | ICD-10-CM

## 2022-07-24 DIAGNOSIS — D123 Benign neoplasm of transverse colon: Secondary | ICD-10-CM

## 2022-07-24 DIAGNOSIS — Z8719 Personal history of other diseases of the digestive system: Secondary | ICD-10-CM | POA: Diagnosis not present

## 2022-07-24 DIAGNOSIS — K317 Polyp of stomach and duodenum: Secondary | ICD-10-CM | POA: Diagnosis not present

## 2022-07-24 DIAGNOSIS — E785 Hyperlipidemia, unspecified: Secondary | ICD-10-CM | POA: Diagnosis not present

## 2022-07-24 DIAGNOSIS — I10 Essential (primary) hypertension: Secondary | ICD-10-CM | POA: Diagnosis not present

## 2022-07-24 DIAGNOSIS — I110 Hypertensive heart disease with heart failure: Secondary | ICD-10-CM | POA: Insufficient documentation

## 2022-07-24 DIAGNOSIS — Z86018 Personal history of other benign neoplasm: Secondary | ICD-10-CM | POA: Diagnosis not present

## 2022-07-24 DIAGNOSIS — R110 Nausea: Secondary | ICD-10-CM | POA: Diagnosis not present

## 2022-07-24 DIAGNOSIS — G8929 Other chronic pain: Secondary | ICD-10-CM | POA: Diagnosis not present

## 2022-07-24 DIAGNOSIS — Z09 Encounter for follow-up examination after completed treatment for conditions other than malignant neoplasm: Secondary | ICD-10-CM

## 2022-07-24 HISTORY — PX: SCLEROTHERAPY: SHX6841

## 2022-07-24 HISTORY — PX: BIOPSY: SHX5522

## 2022-07-24 HISTORY — PX: POLYPECTOMY: SHX5525

## 2022-07-24 HISTORY — PX: HOT HEMOSTASIS: SHX5433

## 2022-07-24 HISTORY — PX: SUBMUCOSAL LIFTING INJECTION: SHX6855

## 2022-07-24 HISTORY — PX: ESOPHAGOGASTRODUODENOSCOPY (EGD) WITH PROPOFOL: SHX5813

## 2022-07-24 SURGERY — ESOPHAGOGASTRODUODENOSCOPY (EGD) WITH PROPOFOL
Anesthesia: General

## 2022-07-24 MED ORDER — GLUCAGON HCL RDNA (DIAGNOSTIC) 1 MG IJ SOLR
INTRAMUSCULAR | Status: AC
  Filled 2022-07-24: qty 1

## 2022-07-24 MED ORDER — PROPOFOL 10 MG/ML IV BOLUS
INTRAVENOUS | Status: DC | PRN
  Administered 2022-07-24: 50 mg via INTRAVENOUS

## 2022-07-24 MED ORDER — PROPOFOL 500 MG/50ML IV EMUL
INTRAVENOUS | Status: DC | PRN
  Administered 2022-07-24: 150 ug/kg/min via INTRAVENOUS

## 2022-07-24 MED ORDER — SODIUM CHLORIDE (PF) 0.9 % IJ SOLN
PREFILLED_SYRINGE | INTRAMUSCULAR | Status: DC | PRN
  Administered 2022-07-24: .2 mL

## 2022-07-24 MED ORDER — OMEPRAZOLE 40 MG PO CPDR
40.0000 mg | DELAYED_RELEASE_CAPSULE | Freq: Two times a day (BID) | ORAL | 0 refills | Status: DC
Start: 2022-07-24 — End: 2022-11-25

## 2022-07-24 MED ORDER — EPINEPHRINE 1 MG/10ML IJ SOSY
PREFILLED_SYRINGE | INTRAMUSCULAR | Status: AC
  Filled 2022-07-24: qty 10

## 2022-07-24 MED ORDER — SUCRALFATE 1 GM/10ML PO SUSP: 1.0000 g | Freq: Two times a day (BID) | ORAL | 0 refills | Status: DC

## 2022-07-24 MED ORDER — LIDOCAINE 2% (20 MG/ML) 5 ML SYRINGE
INTRAMUSCULAR | Status: DC | PRN
  Administered 2022-07-24: 60 mg via INTRAVENOUS

## 2022-07-24 MED ORDER — LACTATED RINGERS IV SOLN
INTRAVENOUS | Status: DC
  Administered 2022-07-24: 1000 mL via INTRAVENOUS

## 2022-07-24 MED ORDER — GLUCAGON HCL RDNA (DIAGNOSTIC) 1 MG IJ SOLR
INTRAMUSCULAR | Status: DC | PRN
  Administered 2022-07-24: .5 mg via INTRAVENOUS

## 2022-07-24 NOTE — Addendum Note (Signed)
Addended by: Marlowe Shores on: 07/24/2022 01:58 PM   Modules accepted: Orders

## 2022-07-24 NOTE — Telephone Encounter (Signed)
[place] ordered and scheduled. Pt scheduled for 08/08/22 at 8:30 am; arrival time 8:15 am. NPO 8 hours prior.  Will contact patient on 07/26/22 to inform her of appt since pt had procedure done today.

## 2022-07-24 NOTE — Anesthesia Preprocedure Evaluation (Signed)
Anesthesia Evaluation  Patient identified by MRN, date of birth, ID band Patient awake    Reviewed: Allergy & Precautions, H&P , NPO status , Patient's Chart, lab work & pertinent test results, reviewed documented beta blocker date and time   Airway Mallampati: II  TM Distance: >3 FB Neck ROM: full    Dental no notable dental hx.    Pulmonary neg pulmonary ROS   Pulmonary exam normal breath sounds clear to auscultation       Cardiovascular Exercise Tolerance: Good hypertension, + DOE  negative cardio ROS  Rhythm:regular Rate:Normal     Neuro/Psych  Neuromuscular disease CVA negative neurological ROS  negative psych ROS   GI/Hepatic negative GI ROS, Neg liver ROS,GERD  ,,  Endo/Other  negative endocrine ROS    Renal/GU negative Renal ROS  negative genitourinary   Musculoskeletal   Abdominal   Peds  Hematology negative hematology ROS (+) Blood dyscrasia, anemia   Anesthesia Other Findings   Reproductive/Obstetrics negative OB ROS                             Anesthesia Physical Anesthesia Plan  ASA: 3  Anesthesia Plan: General   Post-op Pain Management:    Induction:   PONV Risk Score and Plan: Propofol infusion  Airway Management Planned:   Additional Equipment:   Intra-op Plan:   Post-operative Plan:   Informed Consent: I have reviewed the patients History and Physical, chart, labs and discussed the procedure including the risks, benefits and alternatives for the proposed anesthesia with the patient or authorized representative who has indicated his/her understanding and acceptance.     Dental Advisory Given  Plan Discussed with: CRNA  Anesthesia Plan Comments:        Anesthesia Quick Evaluation

## 2022-07-24 NOTE — Interval H&P Note (Signed)
History and Physical Interval Note:  07/24/2022 11:03 AM  Kimberly Mccormick  has presented today for surgery, with the diagnosis of RUQ PAIN; HX DUODENAL ADENOMA.  The various methods of treatment have been discussed with the patient and family. After consideration of risks, benefits and other options for treatment, the patient has consented to  Procedure(s) with comments: ESOPHAGOGASTRODUODENOSCOPY (EGD) WITH PROPOFOL (N/A) - 12:45;ASA 1-2 as a surgical intervention.  The patient's history has been reviewed, patient examined, no change in status, stable for surgery.  I have reviewed the patient's chart and labs.  Questions were answered to the patient's satisfaction.     Katrinka Blazing Mayorga

## 2022-07-24 NOTE — Transfer of Care (Signed)
Immediate Anesthesia Transfer of Care Note  Patient: Kimberly Mccormick  Procedure(s) Performed: ESOPHAGOGASTRODUODENOSCOPY (EGD) WITH PROPOFOL BIOPSY POLYPECTOMY SUBMUCOSAL LIFTING INJECTION HOT HEMOSTASIS (ARGON PLASMA COAGULATION/BICAP) SCLEROTHERAPY  Patient Location: Endoscopy Unit  Anesthesia Type:General  Level of Consciousness: awake  Airway & Oxygen Therapy: Patient Spontanous Breathing  Post-op Assessment: Report given to RN and Post -op Vital signs reviewed and stable  Post vital signs: Reviewed and stable  Last Vitals:  Vitals Value Taken Time  BP 138/78   Temp 98   Pulse 65   Resp 16   SpO2 95     Last Pain:  Vitals:   07/24/22 1135  TempSrc:   PainSc: 7       Patients Stated Pain Goal: 10 (07/24/22 1116)  Complications: No notable events documented.

## 2022-07-24 NOTE — Telephone Encounter (Signed)
Dolores Frame, MD  Marlowe Shores, LPN Hi Kenney Houseman,  Can you please schedule a RUQ [place]? Dx: RUQ pain.  Thanks,  Katrinka Blazing, MD Gastroenterology and Hepatology [facility]

## 2022-07-24 NOTE — Discharge Instructions (Addendum)
You are being discharged to home.  Resume your previous diet.  We are waiting for your pathology results.  Your physician has recommended a repeat upper endoscopy in one year for surveillance.  Take Prilosec (omeprazole) 40 mg by mouth twice a day for three months.  Take Carafate (sucralfate) suspension 1 gram by mouth twice a day for one month.

## 2022-07-24 NOTE — Op Note (Signed)
[facility] Patient Name: Kimberly Mccormick Procedure Date: 07/24/2022 11:24 AM MRN: [id number] Date of Birth: Mar 13, 1950 Attending MD: Katrinka Blazing , , [id number] CSN: [id number] Age: 72 Admit Type: Outpatient Procedure:                Upper GI endoscopy Indications:              Abdominal pain in the right upper quadrant,                            Follow-up of benign duodenal tumor Providers:                Katrinka Blazing, Angelica Ran, Zena Amos Referring MD:              Medicines:                Monitored Anesthesia Care Complications:            No immediate complications. Estimated Blood Loss:     Estimated blood loss: none. Procedure:                Pre-Anesthesia Assessment:                           - Prior to the procedure, a History and Physical                            was performed, and patient medications, allergies                            and sensitivities were reviewed. The patient's                            tolerance of previous anesthesia was reviewed.                           - The risks and benefits of the procedure and the                            sedation options and risks were discussed with the                            patient. All questions were answered and informed                            consent was obtained.                           - ASA Grade Assessment: II - A patient with mild                            systemic disease.                           After obtaining informed consent, the endoscope was                            passed under  direct vision. Throughout the                            procedure, the patient's blood pressure, pulse, and                            oxygen saturations were monitored continuously. The                            [id number]-H190 ([id number]) scope was introduced through the                            mouth, and advanced to the second part of duodenum.                            The upper GI  endoscopy was accomplished without                            difficulty. The patient tolerated the procedure                            well. Scope In: 11:40:14 AM Scope Out: 12:08:27 PM Total Procedure Duration: 0 hours 28 minutes 13 seconds  Findings:      A 3 cm hiatal hernia was present.      The stomach was normal.      A single 5 to 8 mm flat polyp was found in the duodenal bulb, located at       the previous site of polypectomy. Area was successfully injected with 4       mL Eleview for lesion assessment, but the lesion could not be lifted       adequately. Area was successfully injected with 0.2 mL of a 1:100,000       solution of epinephrine as well. Overall, it was felt that there was       significant resistance with injection due to possible fibrosis. Imaging       was performed using white light and narrow band imaging to visualize the       mucosa and demarcate the polyp site after injection for EMR purposes.       The polyp was removed with a piecemeal technique using a cold snare.       Polyp resection was partial as there was there was poor grasping of the       tissue with the snare - some tissue was shaved superficially, and the       resected tissue was not retrieved. Coagulation for destruction of       remaining portion of lesion using argon plasma at 0.3 liters/minute and       20 watts was successful.      The second portion of the duodenum was normal. Biopsies were taken with       a cold forceps for histology. Impression:               - 3 cm hiatal hernia.                           - Normal stomach.                           -  A single duodenal polyp. Partially resected via                            EMR. Resected tissue not retrieved. Remaining                            tissue was ablated with argon plasma coagulation                            ([facility]).                           - Normal second portion of the duodenum. Biopsied. Moderate Sedation:      Per  Anesthesia Care Recommendation:           - Discharge patient to home (ambulatory).                           - Resume previous diet.                           - Await pathology results.                           - Repeat upper endoscopy in 1 year for surveillance.                           - Proceed with RUQ [place].                           - Use Prilosec (omeprazole) 40 mg PO BID for 3                            months.                           - Use sucralfate suspension 1 gram PO BID for 1                            month. Procedure Code(s):        --- Professional ---                           [phone], 59, Esophagogastroduodenoscopy, flexible,                            transoral; with removal of tumor(s), polyp(s), or                            other lesion(s) by snare technique                           43239, 59, Esophagogastroduodenoscopy, flexible,                            transoral; with biopsy, single or multiple  43236, 59, Esophagogastroduodenoscopy, flexible,                            transoral; with directed submucosal injection(s),                            any substance Diagnosis Code(s):        --- Professional ---                           K44.9, Diaphragmatic hernia without obstruction or                            gangrene                           K31.7, Polyp of stomach and duodenum                           R10.11, Right upper quadrant pain                           D13.2, Benign neoplasm of duodenum CPT copyright 2022 American Medical Association. All rights reserved. The codes documented in this report are preliminary and upon coder review may  be revised to meet current compliance requirements. Katrinka Blazing, MD Katrinka Blazing,  07/24/2022 12:31:56 PM This report has been signed electronically. Number of Addenda: 0

## 2022-07-26 LAB — SURGICAL PATHOLOGY

## 2022-07-26 NOTE — Telephone Encounter (Signed)
Left message to return call 

## 2022-07-26 NOTE — Anesthesia Postprocedure Evaluation (Signed)
Anesthesia Post Note  Patient: Kimberly Mccormick  Procedure(s) Performed: ESOPHAGOGASTRODUODENOSCOPY (EGD) WITH PROPOFOL BIOPSY POLYPECTOMY SUBMUCOSAL LIFTING INJECTION HOT HEMOSTASIS (ARGON PLASMA COAGULATION/BICAP) SCLEROTHERAPY  Patient location during evaluation: Phase II Anesthesia Type: General Level of consciousness: awake Pain management: pain level controlled Vital Signs Assessment: post-procedure vital signs reviewed and stable Respiratory status: spontaneous breathing and respiratory function stable Cardiovascular status: blood pressure returned to baseline and stable Postop Assessment: no headache and no apparent nausea or vomiting Anesthetic complications: no Comments: Late entry   No notable events documented.   Last Vitals:  Vitals:   07/24/22 1116 07/24/22 1213  BP: (!) 186/91 128/69  Pulse: (!) 51 67  Resp: 15 (!) 21  Temp: (!) 36.3 C 36.5 C  SpO2: 98% 96%    Last Pain:  Vitals:   07/24/22 1213  TempSrc: Oral  PainSc: 0-No pain                 Windell Norfolk

## 2022-07-29 ENCOUNTER — Encounter (INDEPENDENT_AMBULATORY_CARE_PROVIDER_SITE_OTHER): Payer: Self-pay | Admitting: *Deleted

## 2022-07-29 NOTE — Telephone Encounter (Signed)
Left detailed message on home phone answering machine  (OK per DPR)

## 2022-08-07 NOTE — Telephone Encounter (Signed)
Pt left voicemail asking for return call.  Returned call to pt but voicemail is full. Left message on home number.

## 2022-08-08 ENCOUNTER — Ambulatory Visit (HOSPITAL_COMMUNITY)
Admission: RE | Admit: 2022-08-08 | Discharge: 2022-08-08 | Disposition: A | Discharge status: Home / Self Care | Payer: 59 | Source: Ambulatory Visit | Attending: Gastroenterology | Admitting: Gastroenterology

## 2022-08-08 DIAGNOSIS — R1011 Right upper quadrant pain: Secondary | ICD-10-CM | POA: Diagnosis not present

## 2022-08-08 DIAGNOSIS — R932 Abnormal findings on diagnostic imaging of liver and biliary tract: Secondary | ICD-10-CM | POA: Diagnosis not present

## 2022-08-12 ENCOUNTER — Encounter (HOSPITAL_COMMUNITY): Payer: Self-pay | Admitting: Gastroenterology

## 2022-08-12 DIAGNOSIS — E876 Hypokalemia: Secondary | ICD-10-CM | POA: Diagnosis not present

## 2022-08-12 DIAGNOSIS — N179 Acute kidney failure, unspecified: Secondary | ICD-10-CM | POA: Diagnosis not present

## 2022-08-12 DIAGNOSIS — K219 Gastro-esophageal reflux disease without esophagitis: Secondary | ICD-10-CM | POA: Diagnosis not present

## 2022-08-27 ENCOUNTER — Other Ambulatory Visit (INDEPENDENT_AMBULATORY_CARE_PROVIDER_SITE_OTHER): Payer: Self-pay | Admitting: Gastroenterology

## 2022-09-25 ENCOUNTER — Telehealth (HOSPITAL_COMMUNITY): Payer: Self-pay

## 2022-09-25 NOTE — Telephone Encounter (Signed)
Called to schedule mra, spoke to son and he gave her her cell to try, no answer, no vm. AB

## 2022-10-07 ENCOUNTER — Ambulatory Visit (HOSPITAL_COMMUNITY)
Admission: RE | Admit: 2022-10-07 | Discharge: 2022-10-07 | Disposition: A | Discharge status: Home / Self Care | Payer: 59 | Source: Ambulatory Visit | Attending: Interventional Radiology | Admitting: Interventional Radiology

## 2022-10-07 DIAGNOSIS — I672 Cerebral atherosclerosis: Secondary | ICD-10-CM | POA: Diagnosis not present

## 2022-10-07 DIAGNOSIS — I671 Cerebral aneurysm, nonruptured: Secondary | ICD-10-CM | POA: Diagnosis not present

## 2022-10-07 DIAGNOSIS — R519 Headache, unspecified: Secondary | ICD-10-CM | POA: Insufficient documentation

## 2022-10-09 NOTE — Assessment & Plan Note (Deleted)
05/08/2018 lumpectomy Kimberly Mccormick): IDC with DCIS, 1.8cm, grade 2, ER+ (95%), PR-, HER2 negative (1+, IHC), Ki67 20%, clear margins, 4 SLN negative. Oncotype DX score 30: Distant recurrence risk at 9 years 19% Treatment plan: 1.  Adjuvant chemotherapy with Taxotere and Cytoxan x4 (patient refused) 2.  Adjuvant radiation therapy at [facility] 3.  Followed by adjuvant antiestrogen therapy: (Patient undecided) ----------------------------------------------------------------------------------------------------------------------------------  CT chest abdomen pelvis 02/11/2019: No evidence of metastatic disease.   December 2022: Stroke Hospitalization 04/25/2021-04/27/2021 left internal carotid artery aneurysm embolization   Breast cancer surveillance: Mammogram 12/19/2021 at [facility]: Benign, density category C 2. Breast exam 10/10/2022: Scar tissue feels nodular in the left breast.  12 o'clock position there is a small palpable lesion that to exam feels like a tiny lipoma.   We will set her up for a left breast mammogram and ultrasound for further evaluation.   Return to clinic in 1 year for checkup

## 2022-10-10 ENCOUNTER — Inpatient Hospital Stay: Payer: 59 | Admitting: Hematology and Oncology

## 2022-10-10 DIAGNOSIS — E7849 Other hyperlipidemia: Secondary | ICD-10-CM | POA: Diagnosis not present

## 2022-10-10 DIAGNOSIS — E039 Hypothyroidism, unspecified: Secondary | ICD-10-CM | POA: Diagnosis not present

## 2022-10-10 DIAGNOSIS — E876 Hypokalemia: Secondary | ICD-10-CM | POA: Diagnosis not present

## 2022-10-10 DIAGNOSIS — C50212 Malignant neoplasm of upper-inner quadrant of left female breast: Secondary | ICD-10-CM

## 2022-10-10 DIAGNOSIS — K219 Gastro-esophageal reflux disease without esophagitis: Secondary | ICD-10-CM | POA: Diagnosis not present

## 2022-10-10 DIAGNOSIS — D649 Anemia, unspecified: Secondary | ICD-10-CM | POA: Diagnosis not present

## 2022-10-12 DIAGNOSIS — Z7982 Long term (current) use of aspirin: Secondary | ICD-10-CM | POA: Diagnosis not present

## 2022-10-12 DIAGNOSIS — Z1152 Encounter for screening for COVID-19: Secondary | ICD-10-CM | POA: Diagnosis not present

## 2022-10-12 DIAGNOSIS — N3000 Acute cystitis without hematuria: Secondary | ICD-10-CM | POA: Diagnosis not present

## 2022-10-12 DIAGNOSIS — Z5181 Encounter for therapeutic drug level monitoring: Secondary | ICD-10-CM | POA: Diagnosis not present

## 2022-10-12 DIAGNOSIS — I6381 Other cerebral infarction due to occlusion or stenosis of small artery: Secondary | ICD-10-CM | POA: Diagnosis not present

## 2022-10-12 DIAGNOSIS — Z880 Allergy status to penicillin: Secondary | ICD-10-CM | POA: Diagnosis not present

## 2022-10-12 DIAGNOSIS — Z20822 Contact with and (suspected) exposure to covid-19: Secondary | ICD-10-CM | POA: Diagnosis not present

## 2022-10-17 ENCOUNTER — Ambulatory Visit: Payer: 59 | Admitting: Adult Health

## 2022-10-17 DIAGNOSIS — I6930 Unspecified sequelae of cerebral infarction: Secondary | ICD-10-CM | POA: Diagnosis not present

## 2022-10-17 DIAGNOSIS — D509 Iron deficiency anemia, unspecified: Secondary | ICD-10-CM | POA: Diagnosis not present

## 2022-10-17 DIAGNOSIS — G20A1 Parkinson's disease without dyskinesia, without mention of fluctuations: Secondary | ICD-10-CM | POA: Diagnosis not present

## 2022-10-17 DIAGNOSIS — I10 Essential (primary) hypertension: Secondary | ICD-10-CM | POA: Diagnosis not present

## 2022-10-17 DIAGNOSIS — K219 Gastro-esophageal reflux disease without esophagitis: Secondary | ICD-10-CM | POA: Diagnosis not present

## 2022-10-17 DIAGNOSIS — C50912 Malignant neoplasm of unspecified site of left female breast: Secondary | ICD-10-CM | POA: Diagnosis not present

## 2022-10-17 DIAGNOSIS — E7849 Other hyperlipidemia: Secondary | ICD-10-CM | POA: Diagnosis not present

## 2022-10-17 DIAGNOSIS — Z6823 Body mass index (BMI) 23.0-23.9, adult: Secondary | ICD-10-CM | POA: Diagnosis not present

## 2022-10-17 DIAGNOSIS — I671 Cerebral aneurysm, nonruptured: Secondary | ICD-10-CM | POA: Diagnosis not present

## 2022-10-17 NOTE — Progress Notes (Deleted)
[facility] [address] [place]. [place] [address] [phone]       OFFICE FOLLOW UP NOTE  Ms. Kimberly Mccormick Date of Birth:  1950-11-14 Medical Record Number:  [id number]    Primary neurologist: Dr. Frances Furbish Reason for visit: Parkinson's disease    SUBJECTIVE:   CHIEF COMPLAINT:  No chief complaint on file.   Brief HPI:   Kimberly Mccormick is a 72 y.o. female who is being followed for left-sided predominant Parkinson's disease.  Initially seen by Dr. Frances Furbish 11/2019 for several year history of intermittent left hand tremors and exam showed evidence of mild left-sided parkinsonism.  DaTscan 02/2020 supportive of diagnosis. Hx of right corona radiata infarct 12/2020 with incidental finding of cerebral aneurysm on workup s/p endovascular treatment with flow diverter device 04/2021 by Dr. Corliss Skains.  At prior visit on 03/28/2022, she continued on pramipexole 0.5 mg TID as stable at that time.    Interval history:  Returns today for follow-up visit unaccompanied. Stable since prior visit.  Continues on pramipexole 0.5mg  TID, tolerates well, continues to see benefit in regards to her tremor. Can see some worsening of tremor when she is feeling anxious or under stress. Speech has been stable but at times is told people cannot understand her.  Denies any swallowing or constipation issues.  Ambulates without AD, denies any recent falls.  Does have some sleeping difficulty, currently prescribed Ambien by PCP but tries to not take this unless absolutely necessary.  Appetite has been poor recently, plans on scheduling follow-up visit with PCP to further discuss.  Recent ED visit 9/21 for AMS, dx'd with UTI, treated with Omnicef      ROS:   14 system review of systems performed and negative with exception of those listed in HPI  PMH:  Past Medical History:  Diagnosis Date   Aneurysm of left internal carotid artery    Small supraclinoid 5 mm   Breast cancer (HCC)     Left s/p lumpectomy (05/06/2018) and adjuvant chemotherapy (06/23/2018) with Adriamycin and Cytoxan x4 followed by Taxol weekly x12   DDD (degenerative disc disease), lumbar    Essential hypertension    Genital warts    GERD (gastroesophageal reflux disease)    History of anemia    History of renal insufficiency    Hypertension    Insomnia    Iron deficiency anemia    Ischemic stroke [facility])    December 2022   Mixed hyperlipidemia    Osteoarthritis    Parkinson's disease    Peripheral neuropathy    Related to chemotherapy    PSH:  Past Surgical History:  Procedure Laterality Date   APPENDECTOMY  1966   BIOPSY  08/29/2020   Procedure: BIOPSY;  Surgeon: Dolores Frame, MD;  [facility];  Service: Gastroenterology;;   BIOPSY  07/24/2022   Procedure: BIOPSY;  Surgeon: Dolores Frame, MD;  [facility];  Service: Gastroenterology;;   BREAST LUMPECTOMY WITH RADIOACTIVE SEED AND SENTINEL LYMPH NODE BIOPSY Left 05/06/2018   Procedure: LEFT BREAST LUMPECTOMY WITH RADIOACTIVE SEED AND LEFT DEEP AXILLARY SENTINEL LYMPH NODE BIOPSY AND BLUE DYE INJECTION;  Surgeon: Claud Kelp, MD;  Location: [facility];  Service: General;  Laterality: Left;   CATARACT EXTRACTION W/PHACO Left 01/10/2014   Procedure: CATARACT EXTRACTION PHACO AND INTRAOCULAR LENS PLACEMENT ; CDE:  4.94;  Surgeon: Susa Simmonds, MD;  Location: [facility];  Service: Ophthalmology;  Laterality: Left;   CESAREAN SECTION  1986   CHOLECYSTECTOMY     COLONOSCOPY WITH PROPOFOL N/A 07/25/2020   Procedure: COLONOSCOPY WITH PROPOFOL;  Surgeon: Dolores Frame, MD;  [facility];  Service: Gastroenterology;  Laterality: N/A;  10:55   ESOPHAGOGASTRODUODENOSCOPY (EGD) WITH PROPOFOL N/A 08/29/2020   Procedure: ESOPHAGOGASTRODUODENOSCOPY (EGD) WITH PROPOFOL;  Surgeon: Dolores Frame, MD;  [facility];  Service: Gastroenterology;  Laterality: N/A;   12:00   ESOPHAGOGASTRODUODENOSCOPY (EGD) WITH PROPOFOL N/A 07/24/2022   Procedure: ESOPHAGOGASTRODUODENOSCOPY (EGD) WITH PROPOFOL;  Surgeon: Dolores Frame, MD;  [facility];  Service: Gastroenterology;  Laterality: N/A;  12:45;ASA 1-2   HOT HEMOSTASIS  07/24/2022   Procedure: HOT HEMOSTASIS (ARGON PLASMA COAGULATION/BICAP);  Surgeon: Marguerita Merles, Reuel Boom, MD;  [facility];  Service: Gastroenterology;;   IR 3D INDEPENDENT WKST  04/25/2021   IR ANGIO INTRA EXTRACRAN SEL COM CAROTID INNOMINATE UNI R MOD SED  04/25/2021   IR ANGIO INTRA EXTRACRAN SEL INTERNAL CAROTID UNI L MOD SED  04/25/2021   IR ANGIOGRAM FOLLOW UP STUDY  04/25/2021   IR CT HEAD LTD  04/25/2021   IR NEURO EACH ADD'L AFTER BASIC UNI LEFT (MS)  04/25/2021   IR RADIOLOGIST EVAL & MGMT  03/22/2021   IR RADIOLOGIST EVAL & MGMT  03/28/2021   IR RADIOLOGIST EVAL & MGMT  05/15/2021   IR RADIOLOGIST EVAL & MGMT  09/28/2021   IR TRANSCATH/EMBOLIZ  04/25/2021   IR US GUIDE VASC ACCESS RIGHT  04/25/2021   MYRINGOTOMY WITH TUBE PLACEMENT Right 02/04/2017   Procedure: REVISION OF RIGHT MYRINGOTOMY WITH TUBE PLACEMENT, WITH EXAM OF LEFT EAR;  Surgeon: Newman Pies, MD;  Location: [facility];  Service: ENT;  Laterality: Right;   NASAL SINUS SURGERY  2016   polypectomy   POLYPECTOMY  07/25/2020   Procedure: POLYPECTOMY;  Surgeon: Dolores Frame, MD;  [facility];  Service: Gastroenterology;;   POLYPECTOMY  08/29/2020   Procedure: POLYPECTOMY;  Surgeon: Dolores Frame, MD;  [facility];  Service: Gastroenterology;;   POLYPECTOMY  07/24/2022   Procedure: POLYPECTOMY;  Surgeon: Dolores Frame, MD;  [facility];  Service: Gastroenterology;;   [facility] REMOVAL N/A 01/06/2019   Procedure: REMOVAL PORT-A-CATH;  Surgeon: Claud Kelp, MD;  Location: [facility];  Service: General;  Laterality: N/A;   PORTACATH PLACEMENT Right 06/16/2018    Procedure: INSERTION PORT-A-CATH;  Surgeon: Claud Kelp, MD;  Location: [facility];  Service: General;  Laterality: Right;   RADIOLOGY WITH ANESTHESIA N/A 04/25/2021   Procedure: IR WITH ANESTHESIA EMBOLIZATION;  Surgeon: Julieanne Cotton, MD;  Location: [facility];  Service: Radiology;  Laterality: N/A;   SCLEROTHERAPY  07/24/2022   Procedure: SCLEROTHERAPY;  Surgeon: Marguerita Merles, Reuel Boom, MD;  [facility];  Service: Gastroenterology;;   SUBMUCOSAL LIFTING INJECTION  08/29/2020   Procedure: SUBMUCOSAL LIFTING INJECTION;  Surgeon: Marguerita Merles, Reuel Boom, MD;  [facility];  Service: Gastroenterology;;   Sunnie Nielsen LIFTING INJECTION  07/24/2022   Procedure: SUBMUCOSAL LIFTING INJECTION;  Surgeon: Dolores Frame, MD;  [facility];  Service: Gastroenterology;;   TONSILLECTOMY  1970    Social History:  Social History   Socioeconomic History   Marital status: Legally Separated    Spouse name: Not on file   Number of children: 2   Years of education: Not on file   Highest education level: Not on file  Occupational History    Comment: retired  Tobacco Use   Smoking status: Never   Smokeless tobacco: Never  Vaping Use   Vaping status: Never Used  Substance and Sexual Activity   Alcohol use: No   Drug use: No   Sexual activity: Not Currently    Birth control/protection: Post-menopausal  Other Topics Concern   Not on file  Social History Narrative   One son lives with her   Social Determinants of Health   Financial Resource Strain: Low Risk  (01/08/2021)   Overall Financial Resource Strain (CARDIA)    Difficulty of Paying Living Expenses: Not hard at all  Food Insecurity: No Food Insecurity (01/08/2021)   Hunger Vital Sign    Worried About Running Out of Food in the Last Year: Never true    Ran Out of Food in the Last Year: Never true  Transportation Needs: No Transportation Needs (01/08/2021)   PRAPARE -  Administrator, Civil Service (Medical): No    Lack of Transportation (Non-Medical): No  Physical Activity: Inactive (01/08/2021)   Exercise Vital Sign    Days of Exercise per Week: 0 days    Minutes of Exercise per Session: 0 min  Stress: No Stress Concern Present (01/08/2021)   [employer] of Occupational Health - Occupational Stress Questionnaire    Feeling of Stress : Not at all  Social Connections: Moderately Isolated (01/08/2021)   Social Connection and Isolation Panel [NHANES]    Frequency of Communication with Friends and Family: More than three times a week    Frequency of Social Gatherings with Friends and Family: More than three times a week    Attends Religious Services: More than 4 times per year    Active Member of [employer] or Organizations: No    Attends Banker Meetings: Never    Marital Status: Separated  Intimate Partner Violence: Not At Risk (01/08/2021)   Humiliation, Afraid, Rape, and Kick questionnaire    Fear of Current or Ex-Partner: No    Emotionally Abused: No    Physically Abused: No    Sexually Abused: No    Family History:  Family History  Problem Relation Age of Onset   Lupus Mother    Heart attack Father        age 50   Prostate cancer Father    Hypothyroidism Sister    Breast cancer Sister    Hypertension Sister    Breast cancer Sister    Hypertension Brother    Parkinson's disease Paternal Aunt    Stroke Neg Hx     Medications:   Current Outpatient Medications on File Prior to Visit  Medication Sig Dispense Refill   acyclovir (ZOVIRAX) 400 MG tablet Take 400 mg by mouth 2 (two) times daily.      alum & mag hydroxide-simeth (MAALOX PLUS) 400-400-40 MG/5ML suspension Take 15 mLs by mouth every 6 (six) hours as needed for indigestion. (Patient not taking: Reported on 07/22/2022) 355 mL 0   aspirin EC 81 MG EC tablet Take 1 tablet (81 mg total) by mouth daily. Swallow whole. 30 tablet 11   benzonatate (TESSALON) 100  MG capsule Take 100 mg by mouth 2 (two) times daily as needed for cough.     cetirizine (ZYRTEC) 10 MG tablet Take 10 mg by mouth daily as needed for allergies.     hydrOXYzine (ATARAX) 10 MG/5ML syrup Take by mouth 3 (three) times daily as needed for itching.     metoprolol succinate (TOPROL-XL) 25 MG 24 hr tablet  Take 25 mg by mouth daily.  3   omeprazole (PRILOSEC) 40 MG capsule Take 1 capsule (40 mg total) by mouth 2 (two) times daily. 180 capsule 0   pramipexole (MIRAPEX) 0.5 MG tablet Take 1 tablet (0.5 mg total) by mouth 3 (three) times daily. 270 tablet 3   prednisoLONE (ORAPRED) 15 MG/5ML solution Take 15 mg by mouth daily before breakfast.     PROCTO-MED HC 2.5 % rectal cream Place 1 application. rectally 2 (two) times daily as needed for hemorrhoids.     sucralfate (CARAFATE) 1 GM/10ML suspension TAKE 10 ML BY MOUTH TWICE DAILY 600 mL 0   zolpidem (AMBIEN) 10 MG tablet Take 5 mg by mouth at bedtime as needed for sleep.     ZTLIDO 1.8 % PTCH Apply 1 patch topically every other day.     No current facility-administered medications on file prior to visit.    Allergies:   Allergies  Allergen Reactions   Egg-Derived Products Nausea And Vomiting   Other Nausea And Vomiting    Dairy products\    Penicillins Hives    Has patient had a PCN reaction causing immediate rash, facial/tongue/throat swelling, SOB or lightheadedness with hypotension: No Has patient had a PCN reaction causing severe rash involving mucus membranes or skin necrosis: No Has patient had a PCN reaction that required hospitalization: No Has patient had a PCN reaction occurring within the last 10 years: No If all of the above answers are "NO", then may proceed with Cephalosporin use.       OBJECTIVE:  Physical Exam  There were no vitals filed for this visit.  There is no height or weight on file to calculate BMI. No results found.   General: well developed, well nourished, very pleasant elderly female,  seated, in no evident distress Head: head normocephalic and atraumatic.   Neck: supple with no carotid or supraclavicular bruits Cardiovascular: regular rate and rhythm, no murmurs Musculoskeletal: no deformity Skin:  no rash/petichiae Vascular:  Normal pulses all extremities   Neurologic Exam Mental Status: Awake and fully alert. Oriented to place and time. Recent and remote memory intact. Attention span, concentration and fund of knowledge appropriate. Mood and affect appropriate.  Mild hypophonia, no dysarthria or voice tremor noted.  Mild to moderate facial masking Cranial Nerves: Pupils equal, briskly reactive to light. Extraocular movements full without nystagmus. Visual fields full to confrontation. Hearing mildly impaired. Facial sensation intact. Face, tongue, palate moves normally and symmetrically.  Motor: Normal strength in all tested extremity muscles.  Mild resting tremor of LUE and mildly increased tone of left wrist.  Minimal postural tremor of LUE.  Unable to appreciate action or intention tremor bilaterally.  No lower extremity tremor.  Mild to moderate difficulty with finger taps on left side, fairly normal on right side.  Mildly impaired foot tap on left, normal on right. Sensory.: intact to touch , pinprick , position and vibratory sensation.  Gait and Station: Arises from chair without difficulty. Stance is slightly hunched. Gait demonstrates adequate step height and stride length bilaterally although slightly slow walking speed and slightly decreased arm swing on left.  Ambulates without AD Reflexes: 1+ and symmetric. Toes downgoing.         ASSESSMENT/PLAN: KAM INCLAN is a 72 y.o. year old female with left-sided predominant Parkinson's disease since 2021. Hx of right corona radiata infarct 12/2020 with incidental finding of cerebral aneurysm s/p flow diverter device 04/2021 with Dr. Corliss Skains, routinely followed by Dr. Corliss Skains.  1.  Parkinson  disease  -overall stable since prior visit -Continue pramipexole 0.5 mg TID -Continue routine exercise and ensure adequate hydration -Discussed trialing melatonin to help with occasional insomnia and ensure adequate sleep hygiene    Follow up in 6 months or call earlier if needed     CC:  PCP: Burdine, Ananias Pilgrim, MD    I spent 21 minutes of face-to-face and non-face-to-face time with patient.  This included previsit chart review, lab review, study review, order entry, electronic health record documentation, patient education regarding above diagnoses and treatment plan and answered all other questions to patient's satisfaction   Ihor Austin, [facility]  [facility] [address] [place], [place] [address]  Phone [phone] Fax [phone] Note: This document was prepared with digital dictation and possible smart phrase technology. Any transcriptional errors that result from this process are unintentional.

## 2022-10-29 ENCOUNTER — Inpatient Hospital Stay: Payer: 59 | Attending: Hematology and Oncology | Admitting: Hematology and Oncology

## 2022-10-29 VITALS — BP 155/82 | HR 63 | Temp 97.7°F | Resp 18 | Ht 64.0 in | Wt 133.0 lb

## 2022-10-29 DIAGNOSIS — C50212 Malignant neoplasm of upper-inner quadrant of left female breast: Secondary | ICD-10-CM

## 2022-10-29 DIAGNOSIS — Z170 Estrogen receptor positive status [ER+]: Secondary | ICD-10-CM

## 2022-10-29 NOTE — Assessment & Plan Note (Addendum)
05/08/2018 lumpectomy Derrell Lolling): IDC with DCIS, 1.8cm, grade 2, ER+ (95%), PR-, HER2 negative (1+, IHC), Ki67 20%, clear margins, 4 SLN negative. Oncotype DX score 30: Distant recurrence risk at 9 years 19% Treatment plan: 1.  Adjuvant chemotherapy with Taxotere and Cytoxan x4 (patient refused) 2.  Adjuvant radiation therapy at [facility] 3.  Followed by adjuvant antiestrogen therapy: (Patient undecided) ----------------------------------------------------------------------------------------------------------------------------------  CT chest abdomen pelvis 02/11/2019: No evidence of metastatic disease.   December 2022: Stroke Hospitalization 04/25/2021-04/27/2021 left internal carotid artery aneurysm embolization   Breast cancer surveillance: Mammogram 12/19/2021: 1 cm benign cyst left breast representing fat necrosis density category C 2. Breast exam 10/09/2021: Benign   Patient gets mammograms at [facility] I ordered new mammograms for December 2024   Return to clinic in 1 year for checkup

## 2022-10-29 NOTE — Progress Notes (Signed)
Patient Care Team: Juliette Alcide, MD as PCP - General Diona Browner Illene Bolus, MD as PCP - Cardiology (Cardiology) Pershing Proud, RN as Oncology Nurse Navigator Donnelly Angelica, RN as Oncology Nurse Navigator  DIAGNOSIS:  Encounter Diagnosis  Name Primary?   Malignant neoplasm of upper-inner quadrant of left breast in female, estrogen receptor positive (HCC) Yes    SUMMARY OF ONCOLOGIC HISTORY: Oncology History  Malignant neoplasm of upper-inner quadrant of left breast in female, estrogen receptor positive (HCC)  03/24/2018 Initial Diagnosis   Screening detected left breast mass 8 mm upper inner quadrant biopsy revealed grade 2 IDC with DCIS ER 95%, PR 0%, Ki-67 20%, HER-2 -1+ by IHC, T1 BN 0 stage Ia clinical stage   05/06/2018 Surgery   Lumpectomy Derrell Lolling): IDC with DCIS, 1.8cm, grade 2, ER+ (95%), PR-, HER2 negative (1+, IHC), Ki67 20%, clear margins, 4 SLN negative.    05/18/2018 Oncotype testing   Oncotype DX recurrence score 30: risk of distant recurrence at 9 years is 19%. Chemo benefit is >15%.    06/23/2018 -  Chemotherapy   Adjuvant chemotherapy with dose dense Adriamycin and Cytoxan x4 followed by Taxol weekly x12     CHIEF COMPLIANT: Surveillance of breast cancer    History of Present Illness   The patient, a breast cancer survivor, presents for a routine follow-up, four and a half years post-diagnosis. She reports overall good health with no pain or discomfort. She notes a small knot in the breast, previously identified as fat necrosis on a mammogram performed in [place]. The patient denies any significant weight loss or gain over the past year.       ALLERGIES:  is allergic to egg-derived products, other, and penicillins.  MEDICATIONS:  Current Outpatient Medications  Medication Sig Dispense Refill   acyclovir (ZOVIRAX) 400 MG tablet Take 400 mg by mouth 2 (two) times daily.      aspirin EC 81 MG EC tablet Take 1 tablet (81 mg total) by mouth daily. Swallow  whole. 30 tablet 11   benzonatate (TESSALON) 100 MG capsule Take 100 mg by mouth 2 (two) times daily as needed for cough.     cetirizine (ZYRTEC) 10 MG tablet Take 10 mg by mouth daily as needed for allergies.     omeprazole (PRILOSEC) 40 MG capsule Take 1 capsule (40 mg total) by mouth 2 (two) times daily. 180 capsule 0   pramipexole (MIRAPEX) 0.5 MG tablet Take 1 tablet (0.5 mg total) by mouth 3 (three) times daily. 270 tablet 3   sucralfate (CARAFATE) 1 GM/10ML suspension TAKE 10 ML BY MOUTH TWICE DAILY 600 mL 0   zolpidem (AMBIEN) 10 MG tablet Take 5 mg by mouth at bedtime as needed for sleep.     ZTLIDO 1.8 % PTCH Apply 1 patch topically every other day.     alum & mag hydroxide-simeth (MAALOX PLUS) 400-400-40 MG/5ML suspension Take 15 mLs by mouth every 6 (six) hours as needed for indigestion. (Patient not taking: Reported on 07/22/2022) 355 mL 0   hydrOXYzine (ATARAX) 10 MG/5ML syrup Take by mouth 3 (three) times daily as needed for itching. (Patient not taking: Reported on 10/29/2022)     metoprolol succinate (TOPROL-XL) 25 MG 24 hr tablet Take 25 mg by mouth daily.  3   No current facility-administered medications for this visit.    PHYSICAL EXAMINATION: ECOG PERFORMANCE STATUS: 1 - Symptomatic but completely ambulatory  Vitals:   10/29/22 0821  BP: (!) 155/82  Pulse: 63  Resp: 18  Temp: 97.7 F (36.5 C)  SpO2: 100%   Filed Weights   10/29/22 0821  Weight: 133 lb (60.3 kg)    Physical Exam   MEASUREMENTS: WT- 133 pounds   No palpable lumps or nodules in bilateral breasts.  (exam performed in the presence of a chaperone)  LABORATORY DATA:  I have reviewed the data as listed    Latest Ref Rng & Units 03/13/2022    8:50 PM 01/01/2022   11:42 PM 10/11/2021    9:15 AM  CMP  Glucose 70 - 99 mg/dL 409  811    BUN 8 - 23 mg/dL 15  14    Creatinine 9.14 - 1.00 mg/dL 7.82  9.56    Sodium 213 - 145 mmol/L 138  139    Potassium 3.5 - 5.1 mmol/L 3.4  3.2    Chloride 98 - 111  mmol/L 107  105    CO2 22 - 32 mmol/L 24  25    Calcium 8.9 - 10.3 mg/dL 8.6  9.0    Total Protein 6.5 - 8.1 g/dL   7.4   Total Bilirubin 0.3 - 1.2 mg/dL   1.8   Alkaline Phos 38 - 126 U/L   78   AST 15 - 41 U/L   31   ALT 0 - 44 U/L   29     Lab Results  Component Value Date   WBC 8.1 03/13/2022   HGB 12.1 03/13/2022   HCT 36.6 03/13/2022   MCV 81.3 03/13/2022   PLT 201 03/13/2022   NEUTROABS 8.3 (H) 04/26/2021    ASSESSMENT & PLAN:  Malignant neoplasm of upper-inner quadrant of left breast in female, estrogen receptor positive (HCC) 05/08/2018 lumpectomy Derrell Lolling): IDC with DCIS, 1.8cm, grade 2, ER+ (95%), PR-, HER2 negative (1+, IHC), Ki67 20%, clear margins, 4 SLN negative. Oncotype DX score 30: Distant recurrence risk at 9 years 19% Treatment plan: 1.  Adjuvant chemotherapy with Taxotere and Cytoxan x4 (patient refused) 2.  Adjuvant radiation therapy at [facility] 3.  Followed by adjuvant antiestrogen therapy: (Patient undecided) ----------------------------------------------------------------------------------------------------------------------------------  CT chest abdomen pelvis 02/11/2019: No evidence of metastatic disease.   December 2022: Stroke Hospitalization 04/25/2021-04/27/2021 left internal carotid artery aneurysm embolization   Breast cancer surveillance: Mammogram 12/19/2021: 1 cm benign cyst left breast representing fat necrosis density category C 2. Breast exam 10/09/2021: Benign   Patient gets mammograms at [facility] I ordered new mammograms for December 2024 Several patients in the morning have her with me that she is    Breast Cancer, status post treatment No new complaints. Noted a small knot in the breast, identified as fat necrosis on recent mammogram. -Perform a breast exam today. -Order a follow-up mammogram for late November or early December to monitor the area of fat necrosis.  Return to clinic in 1 year for follow-up         Orders  Placed This Encounter  Procedures   MM DIAG BREAST TOMO BILATERAL    Standing Status:   Future    Standing Expiration Date:   10/29/2023    Order Specific Question:   Reason for Exam (SYMPTOM  OR DIAGNOSIS REQUIRED)    Answer:   ANnual mammograms with H/O breast cancer, palpable nodule    Order Specific Question:   Preferred imaging location?    Answer:   [facility]   The patient has a good understanding of the overall plan. she agrees with it. she will call  with any problems that may develop before the next visit here. Total time spent: 30 mins including face to face time and time spent for planning, charting and co-ordination of care   Tamsen Meek, MD 10/29/22

## 2022-11-04 ENCOUNTER — Telehealth (HOSPITAL_COMMUNITY): Payer: Self-pay

## 2022-11-04 NOTE — Telephone Encounter (Signed)
Pt agreed to f/u in 1 year with an MRA. AB

## 2022-11-06 ENCOUNTER — Encounter (INDEPENDENT_AMBULATORY_CARE_PROVIDER_SITE_OTHER): Payer: Self-pay | Admitting: Gastroenterology

## 2022-11-25 ENCOUNTER — Other Ambulatory Visit (HOSPITAL_COMMUNITY): Payer: Self-pay | Admitting: Hematology and Oncology

## 2022-11-25 ENCOUNTER — Other Ambulatory Visit (INDEPENDENT_AMBULATORY_CARE_PROVIDER_SITE_OTHER): Payer: Self-pay | Admitting: Gastroenterology

## 2022-11-25 DIAGNOSIS — G8929 Other chronic pain: Secondary | ICD-10-CM

## 2022-11-25 DIAGNOSIS — N63 Unspecified lump in unspecified breast: Secondary | ICD-10-CM

## 2022-11-25 DIAGNOSIS — K219 Gastro-esophageal reflux disease without esophagitis: Secondary | ICD-10-CM

## 2022-11-25 MED ORDER — OMEPRAZOLE 20 MG PO CPDR: 20.0000 mg | DELAYED_RELEASE_CAPSULE | Freq: Every day | ORAL | 3 refills | Status: DC

## 2022-11-25 NOTE — Telephone Encounter (Signed)
Prescription for 20mg  sent instead.

## 2022-12-11 NOTE — Telephone Encounter (Signed)
Telephone call  

## 2022-12-15 ENCOUNTER — Other Ambulatory Visit: Payer: Self-pay | Admitting: Cardiology

## 2022-12-16 ENCOUNTER — Ambulatory Visit (INDEPENDENT_AMBULATORY_CARE_PROVIDER_SITE_OTHER): Payer: 59 | Admitting: Adult Health

## 2022-12-16 ENCOUNTER — Encounter: Payer: Self-pay | Admitting: Adult Health

## 2022-12-16 VITALS — BP 157/83 | HR 62

## 2022-12-16 DIAGNOSIS — G20A1 Parkinson's disease without dyskinesia, without mention of fluctuations: Secondary | ICD-10-CM

## 2022-12-16 DIAGNOSIS — M545 Low back pain, unspecified: Secondary | ICD-10-CM | POA: Diagnosis not present

## 2022-12-16 DIAGNOSIS — G8929 Other chronic pain: Secondary | ICD-10-CM | POA: Diagnosis not present

## 2022-12-16 MED ORDER — PRAMIPEXOLE DIHYDROCHLORIDE 0.75 MG PO TABS: 0.7500 mg | ORAL_TABLET | Freq: Three times a day (TID) | ORAL | 5 refills | Status: DC

## 2022-12-16 NOTE — Progress Notes (Signed)
[facility] [address] [place]. [place] [address] [phone]       OFFICE FOLLOW UP NOTE  Kimberly Mccormick Date of Birth:  December 29, 1950 Medical Record Number:  [id number]    Primary neurologist: Dr. Frances Furbish Reason for visit: Parkinson's disease    SUBJECTIVE:   CHIEF COMPLAINT:  Chief Complaint  Patient presents with   Follow-up    Rm 3, here with son Gay Filler Pt is here for Parkinson's Disease follow up. Pt states her tremors have gotten worse on her hands. Pt stats she is having pain on her back and sides. Pt states she would like to know if a change in medication might help her symptoms better, also states she would like to get PT to help with her low back pain    Follow up visit:  Prior visit:03/28/2022  Brief HPI:   Kimberly Mccormick is a 72 y.o. female who is being followed for left-sided predominant Parkinson's disease.  Initially seen by Dr. Frances Furbish 11/2019 for several year history of intermittent left hand tremors and exam showed evidence of mild left-sided parkinsonism.  DaTscan 02/2020 supportive of diagnosis. Hx of right corona radiata infarct 12/2020 with incidental finding of cerebral aneurysm on workup s/p endovascular treatment with flow diverter device 04/2021 by Dr. Corliss Skains.  At prior visit, she continued on pramipexole 0.5 mg TID as stable at that time.    Interval history:  Returns today for follow-up visit accompanied by her son.  Reports gradual worsening of tremors since prior visit, can also worsen with anxiety or stress. She continues on pramipexole 0.5mg  TID, tolerating well.  Occasional speech difficulties more due to being soft spoken which is not new. She also c/o mild short term memory difficulties also not new. Ambulates without AD, no recent falls. C/o lower back pain, feels stiffness/tightness in lower back, no radiculopathy symptoms. Tries to keep active, participates at her local [employer] 2 days per week. She questions working with  therapies.      ROS:   14 system review of systems performed and negative with exception of those listed in HPI  PMH:  Past Medical History:  Diagnosis Date   Aneurysm of left internal carotid artery    Small supraclinoid 5 mm   Breast cancer (HCC)    Left s/p lumpectomy (05/06/2018) and adjuvant chemotherapy (06/23/2018) with Adriamycin and Cytoxan x4 followed by Taxol weekly x12   DDD (degenerative disc disease), lumbar    Essential hypertension    Genital warts    GERD (gastroesophageal reflux disease)    History of anemia    History of renal insufficiency    Hypertension    Insomnia    Iron deficiency anemia    Ischemic stroke [facility])    December 2022   Mixed hyperlipidemia    Osteoarthritis    Parkinson's disease (HCC)    Peripheral neuropathy    Related to chemotherapy    PSH:  Past Surgical History:  Procedure Laterality Date   APPENDECTOMY  1966   BIOPSY  08/29/2020   Procedure: BIOPSY;  Surgeon: Dolores Frame, MD;  [facility];  Service: Gastroenterology;;   BIOPSY  07/24/2022   Procedure: BIOPSY;  Surgeon: Dolores Frame, MD;  [facility];  Service: Gastroenterology;;   BREAST LUMPECTOMY WITH RADIOACTIVE SEED AND SENTINEL LYMPH NODE BIOPSY Left 05/06/2018   Procedure: LEFT BREAST LUMPECTOMY WITH RADIOACTIVE SEED AND LEFT DEEP AXILLARY SENTINEL LYMPH NODE BIOPSY AND BLUE DYE INJECTION;  Surgeon:  Claud Kelp, MD;  Location: [facility];  Service: General;  Laterality: Left;   CATARACT EXTRACTION W/PHACO Left 01/10/2014   Procedure: CATARACT EXTRACTION PHACO AND INTRAOCULAR LENS PLACEMENT ; CDE:  4.94;  Surgeon: Susa Simmonds, MD;  Location: [facility];  Service: Ophthalmology;  Laterality: Left;   CESAREAN SECTION  1986   CHOLECYSTECTOMY     COLONOSCOPY WITH PROPOFOL N/A 07/25/2020   Procedure: COLONOSCOPY WITH PROPOFOL;  Surgeon: Dolores Frame, MD;  [facility];  Service:  Gastroenterology;  Laterality: N/A;  10:55   ESOPHAGOGASTRODUODENOSCOPY (EGD) WITH PROPOFOL N/A 08/29/2020   Procedure: ESOPHAGOGASTRODUODENOSCOPY (EGD) WITH PROPOFOL;  Surgeon: Dolores Frame, MD;  [facility];  Service: Gastroenterology;  Laterality: N/A;  12:00   ESOPHAGOGASTRODUODENOSCOPY (EGD) WITH PROPOFOL N/A 07/24/2022   Procedure: ESOPHAGOGASTRODUODENOSCOPY (EGD) WITH PROPOFOL;  Surgeon: Dolores Frame, MD;  [facility];  Service: Gastroenterology;  Laterality: N/A;  12:45;ASA 1-2   HOT HEMOSTASIS  07/24/2022   Procedure: HOT HEMOSTASIS (ARGON PLASMA COAGULATION/BICAP);  Surgeon: Marguerita Merles, Reuel Boom, MD;  [facility];  Service: Gastroenterology;;   IR 3D INDEPENDENT WKST  04/25/2021   IR ANGIO INTRA EXTRACRAN SEL COM CAROTID INNOMINATE UNI R MOD SED  04/25/2021   IR ANGIO INTRA EXTRACRAN SEL INTERNAL CAROTID UNI L MOD SED  04/25/2021   IR ANGIOGRAM FOLLOW UP STUDY  04/25/2021   IR CT HEAD LTD  04/25/2021   IR NEURO EACH ADD'L AFTER BASIC UNI LEFT (MS)  04/25/2021   IR RADIOLOGIST EVAL & MGMT  03/22/2021   IR RADIOLOGIST EVAL & MGMT  03/28/2021   IR RADIOLOGIST EVAL & MGMT  05/15/2021   IR RADIOLOGIST EVAL & MGMT  09/28/2021   IR TRANSCATH/EMBOLIZ  04/25/2021   IR US GUIDE VASC ACCESS RIGHT  04/25/2021   MYRINGOTOMY WITH TUBE PLACEMENT Right 02/04/2017   Procedure: REVISION OF RIGHT MYRINGOTOMY WITH TUBE PLACEMENT, WITH EXAM OF LEFT EAR;  Surgeon: Newman Pies, MD;  Location: [facility];  Service: ENT;  Laterality: Right;   NASAL SINUS SURGERY  2016   polypectomy   POLYPECTOMY  07/25/2020   Procedure: POLYPECTOMY;  Surgeon: Dolores Frame, MD;  [facility];  Service: Gastroenterology;;   POLYPECTOMY  08/29/2020   Procedure: POLYPECTOMY;  Surgeon: Dolores Frame, MD;  [facility];  Service: Gastroenterology;;   POLYPECTOMY  07/24/2022   Procedure: POLYPECTOMY;  Surgeon: Dolores Frame,  MD;  [facility];  Service: Gastroenterology;;   [facility] REMOVAL N/A 01/06/2019   Procedure: REMOVAL PORT-A-CATH;  Surgeon: Claud Kelp, MD;  Location:  SUR[facility]ERY CENTER;  Service: General;  Laterality: N/A;   PORTACATH PLACEMENT Right 06/16/2018   Procedure: INSERTION PORT-A-CATH;  Surgeon: Claud Kelp, MD;  Location:  SURGERY CENTER;  Service: General;  Laterality: Right;   RADIOLOGY WITH ANESTHESIA N/A 04/25/2021   Procedure: IR WITH ANESTHESIA EMBOLIZATION;  Surgeon: Julieanne Cotton, MD;  Location: [facility];  Service: Radiology;  Laterality: N/A;   SCLEROTHERAPY  07/24/2022   Procedure: SCLEROTHERAPY;  Surgeon: Marguerita Merles, Reuel Boom, MD;  [facility];  Service: Gastroenterology;;   SUBMUCOSAL LIFTING INJECTION  08/29/2020   Procedure: SUBMUCOSAL LIFTING INJECTION;  Surgeon: Dolores Frame, MD;  [facility];  Service: Gastroenterology;;   SUBMUCOSAL LIFTING INJECTION  07/24/2022   Procedure: SUBMUCOSAL LIFTING INJECTION;  Surgeon: Dolores Frame, MD;  [facility];  Service: Gastroenterology;;   TONSILLECTOMY  1970    Social History:  Social History   Socioeconomic History   Marital status: Legally Separated    Spouse name: Not on file   Number of children: 2   Years of education: Not on file   Highest education level: Not on file  Occupational History    Comment: retired  Tobacco Use   Smoking status: Never   Smokeless tobacco: Never  Vaping Use   Vaping status: Never Used  Substance and Sexual Activity   Alcohol use: No   Drug use: No   Sexual activity: Not Currently    Birth control/protection: Post-menopausal  Other Topics Concern   Not on file  Social History Narrative   One son lives with her   Social Determinants of Health   Financial Resource Strain: Low Risk  (01/08/2021)   Overall Financial Resource Strain (CARDIA)    Difficulty of Paying Living Expenses: Not hard  at all  Food Insecurity: No Food Insecurity (01/08/2021)   Hunger Vital Sign    Worried About Running Out of Food in the Last Year: Never true    Ran Out of Food in the Last Year: Never true  Transportation Needs: No Transportation Needs (01/08/2021)   PRAPARE - Administrator, Civil Service (Medical): No    Lack of Transportation (Non-Medical): No  Physical Activity: Inactive (01/08/2021)   Exercise Vital Sign    Days of Exercise per Week: 0 days    Minutes of Exercise per Session: 0 min  Stress: No Stress Concern Present (01/08/2021)   [employer] of Occupational Health - Occupational Stress Questionnaire    Feeling of Stress : Not at all  Social Connections: Moderately Isolated (01/08/2021)   Social Connection and Isolation Panel [NHANES]    Frequency of Communication with Friends and Family: More than three times a week    Frequency of Social Gatherings with Friends and Family: More than three times a week    Attends Religious Services: More than 4 times per year    Active Member of [employer] or Organizations: No    Attends Banker Meetings: Never    Marital Status: Separated  Intimate Partner Violence: Not At Risk (01/08/2021)   Humiliation, Afraid, Rape, and Kick questionnaire    Fear of Current or Ex-Partner: No    Emotionally Abused: No    Physically Abused: No    Sexually Abused: No    Family History:  Family History  Problem Relation Age of Onset   Lupus Mother    Heart attack Father        age 44   Prostate cancer Father    Hypothyroidism Sister    Breast cancer Sister    Hypertension Sister    Breast cancer Sister    Hypertension Brother    Parkinson's disease Paternal Aunt    Stroke Neg Hx     Medications:   Current Outpatient Medications on File Prior to Visit  Medication Sig Dispense Refill   acyclovir (ZOVIRAX) 400 MG tablet Take 400 mg by mouth 2 (two) times daily.      aspirin EC 81 MG EC tablet Take 1 tablet (81 mg  total) by mouth daily. Swallow whole. 30 tablet 11   benzonatate (TESSALON) 100 MG capsule Take 100 mg by mouth 2 (two) times daily as needed for cough.     cetirizine (ZYRTEC) 10 MG tablet Take 10 mg by mouth daily as needed for allergies.     metoprolol succinate (TOPROL-XL)  25 MG 24 hr tablet Take 25 mg by mouth daily.  3   omeprazole (PRILOSEC) 20 MG capsule Take 1 capsule (20 mg total) by mouth daily. 90 capsule 3   PRESCRIPTION MEDICATION Pt states she takes a cholesterol medication, does not recall name/dosage     sucralfate (CARAFATE) 1 GM/10ML suspension TAKE 10 ML BY MOUTH TWICE DAILY 600 mL 0   zolpidem (AMBIEN) 10 MG tablet Take 5 mg by mouth at bedtime as needed for sleep.     ZTLIDO 1.8 % PTCH Apply 1 patch topically every other day.     No current facility-administered medications on file prior to visit.    Allergies:   Allergies  Allergen Reactions   Egg-Derived Products Nausea And Vomiting   Other Nausea And Vomiting    Dairy products\    Penicillins Hives    Has patient had a PCN reaction causing immediate rash, facial/tongue/throat swelling, SOB or lightheadedness with hypotension: No Has patient had a PCN reaction causing severe rash involving mucus membranes or skin necrosis: No Has patient had a PCN reaction that required hospitalization: No Has patient had a PCN reaction occurring within the last 10 years: No If all of the above answers are "NO", then may proceed with Cephalosporin use.       OBJECTIVE:  Physical Exam  Vitals:   12/16/22 1013  BP: (!) 157/83  Pulse: 62   There is no height or weight on file to calculate BMI. No results found.  General: well developed, well nourished, very pleasant elderly female, seated, in no evident distress  Neurologic Exam Mental Status: Awake and fully alert. Oriented to place and time. Recent memory mildly impaired and remote memory intact. Attention span, concentration and fund of knowledge appropriate. Mood  and affect appropriate.  Mild hypophonia, no dysarthria or voice tremor noted.  Mild to moderate facial masking Cranial Nerves: Pupils equal, briskly reactive to light. Extraocular movements full without nystagmus. Visual fields full to confrontation. Hearing mildly impaired. Facial sensation intact. Face, tongue, palate moves normally and symmetrically.  Motor: Normal strength in all tested extremity muscles.  Mild resting tremor of LUE and mildly increased tone of left wrist.  Minimal postural tremor of LUE.  Unable to appreciate action or intention tremor bilaterally.  No lower extremity tremor.  Mild to moderate difficulty with finger taps on left side, mildly impaired on right side.  Mildly impaired foot tap on left, normal on right. Sensory.: intact to touch , pinprick , position and vibratory sensation.  Gait and Station: Arises from chair without difficulty. Stance is slightly hunched. Gait demonstrates adequate step height and stride length bilaterally although slightly slow walking speed and decreased arm swing L>R  with slight LUE tremor.  Ambulates without AD Reflexes: 1+ and symmetric. Toes downgoing.         ASSESSMENT/PLAN: Kimberly Mccormick is a 72 y.o. year old female with left-sided predominant Parkinson's disease since 2021. Hx of right corona radiata infarct 12/2020 with incidental finding of cerebral aneurysm s/p flow diverter device 04/2021 with Dr. Corliss Skains, routinely followed by Dr. Corliss Skains.    1.  Parkinson disease  -gradual worsening of tremor, continued hypophonia, mild short term memory difficulties -Increase pramipexole to 0.75 mg TID (discussed with Dr. Frances Furbish). If no benefit, consider adding low dose carbidopa levodopa -Referral placed to PT/OT/SLP at [facility]: [facility] in [place] -Continue routine exercise and ensure adequate hydration   2. Low back pain  -referral placed to PT  -if no  improvement after PT, recommend evaluation with PCP for further evaluation  as unsure if PD related    Follow up in 4 months or call earlier if needed     CC:  PCP: Burdine, Ananias Pilgrim, MD    I spent 30 minutes of face-to-face and non-face-to-face time with patient and son.  This included previsit chart review, lab review, study review, order entry, electronic health record documentation, patient and son education regarding above diagnoses and treatment plan and answered all other questions to patient and son's satisfaction  Ihor Austin, [facility]  [facility] [address] [place], [place] [address]  Phone [phone] Fax [phone] Note: This document was prepared with digital dictation and possible smart phrase technology. Any transcriptional errors that result from this process are unintentional.

## 2022-12-16 NOTE — Patient Instructions (Signed)
Your Plan:  Increase pramipexole to 0.75mg  three times daily  You will be called to start physical, occupation and speech therapy    Follow up in 4 months or call earlier if needed     Thank you for coming to see [place] at [facility]. I hope we have been able to provide you high quality care today.  You may receive a patient satisfaction survey over the next few weeks. We would appreciate your feedback and comments so that we may continue to improve ourselves and the health of our patients.

## 2022-12-17 ENCOUNTER — Telehealth: Payer: Self-pay | Admitting: Adult Health

## 2022-12-17 NOTE — Telephone Encounter (Signed)
Referral  for physical therapy fax to [facility]. Phone: [phone], Fax: [phone]

## 2022-12-17 NOTE — Telephone Encounter (Signed)
Referral for occupational therapy fax to [facility]. Phone: [phone], Fax: [phone]

## 2022-12-23 ENCOUNTER — Encounter (INDEPENDENT_AMBULATORY_CARE_PROVIDER_SITE_OTHER): Payer: Self-pay | Admitting: Gastroenterology

## 2022-12-23 ENCOUNTER — Ambulatory Visit (INDEPENDENT_AMBULATORY_CARE_PROVIDER_SITE_OTHER): Payer: 59 | Admitting: Gastroenterology

## 2022-12-23 VITALS — BP 155/84 | HR 71 | Temp 97.3°F | Ht 63.0 in | Wt 132.5 lb

## 2022-12-23 DIAGNOSIS — R112 Nausea with vomiting, unspecified: Secondary | ICD-10-CM

## 2022-12-23 DIAGNOSIS — R197 Diarrhea, unspecified: Secondary | ICD-10-CM | POA: Diagnosis not present

## 2022-12-23 MED ORDER — SUCRALFATE 1 GM/10ML PO SUSP: 1.0000 g | Freq: Four times a day (QID) | ORAL | 0 refills | Status: DC | PRN

## 2022-12-23 NOTE — Addendum Note (Signed)
Addended by: Dolores Frame on: 12/23/2022 12:11 PM   Modules accepted: Level of Service

## 2022-12-23 NOTE — Patient Instructions (Signed)
Try to stay hydrated, increase intake of water during the next week Restart sucralfate every 6 hours as needed for nausea If presenting persistent diarrhea on Thursday, proceed with stool testing

## 2022-12-23 NOTE — Progress Notes (Signed)
Katrinka Blazing, M.D. Gastroenterology & Hepatology [facility] Gastroenterology [address] [place], [place] [address]  Primary Care Physician: Juliette Alcide, MD [address] [place] [address]  I will communicate my assessment and recommendations to the referring MD via EMR.  Problems: New onset diarrhea and woprsening nausea Chronic abdominal pain/nausea Duodenal adenoma   History of Present Illness: Kimberly Mccormick is a 72 y.o. female with PMH breast cancer s/p , CHF, IBS, hyperlipidemia, stroke, L internal carotid aneurysm s/p embolization, hypertension, who presents for follow-up of diarrhea and nausea with vomiting.  The patient was last seen on 07/01/2022. At that time, the patient was restarted on omeprazole 40 mg every day and she was scheduled for EGD with findings described below.  Patient reports that for the last 3 days she has presented recurrent episodes of nausea. States that a  a couple of days ago she had 2 episodes of vomiting. States she ran out of sucralfate, which she felt made her nausea good in the past. She would like to get a refill of this.  She reports that since Thanksgiving she has been having soft to watery Bms, multiple times per day.   She denies having other complaints, although sometimes has some discomfort in her LUQ. The patient denies having any nausea, vomiting, fever, chills, hematochezia, melena, hematemesis, abdominal distention, abdominal pain, jaundice, pruritus or weight loss. No sick contacts.  Previous imaging studies: -CT of the abdomen pelvis with IV contrast on 08/09/2020 showed right nephrolithiasis which was nonobstructive. -CT of the and pelvis without contrast on 06/27/2021 showed moderate pancreatic atrophy without other abnormalities. -Right upper quadrant ultrasound on 08/08/2022 which showed status postcholecystectomy and possible fatty liver   Last EGD:07/24/2022 - 3 cm hiatal hernia. - Normal  stomach. - A single duodenal polyp. Partially resected via EMR. Resected tissue not retrieved. Remaining tissue was ablated with argon plasma coagulation ( APC) . - Normal second portion of the duodenum. Biopsied.  Recommend a repeat EGD in 1 year  Last Colonoscopy: 07/2020 - Hemorrhoids found on perianal exam. - Two 4 to 10 mm polyps in the ascending colon, removed with a cold snare. Resected and retrieved. - Non-bleeding internal hemorrhoids.   Advised to repeat in 7 years.  Past Medical History: Past Medical History:  Diagnosis Date   Aneurysm of left internal carotid artery    Small supraclinoid 5 mm   Breast cancer (HCC)    Left s/p lumpectomy (05/06/2018) and adjuvant chemotherapy (06/23/2018) with Adriamycin and Cytoxan x4 followed by Taxol weekly x12   DDD (degenerative disc disease), lumbar    Essential hypertension    Genital warts    GERD (gastroesophageal reflux disease)    History of anemia    History of renal insufficiency    Hypertension    Insomnia    Iron deficiency anemia    Ischemic stroke [facility])    December 2022   Mixed hyperlipidemia    Osteoarthritis    Parkinson's disease (HCC)    Peripheral neuropathy    Related to chemotherapy    Past Surgical History: Past Surgical History:  Procedure Laterality Date   APPENDECTOMY  1966   BIOPSY  08/29/2020   Procedure: BIOPSY;  Surgeon: Dolores Frame, MD;  [facility];  Service: Gastroenterology;;   BIOPSY  07/24/2022   Procedure: BIOPSY;  Surgeon: Dolores Frame, MD;  [facility];  Service: Gastroenterology;;   BREAST LUMPECTOMY WITH RADIOACTIVE SEED AND  SENTINEL LYMPH NODE BIOPSY Left 05/06/2018   Procedure: LEFT BREAST LUMPECTOMY WITH RADIOACTIVE SEED AND LEFT DEEP AXILLARY SENTINEL LYMPH NODE BIOPSY AND BLUE DYE INJECTION;  Surgeon: Claud Kelp, MD;  Location: [facility];  Service: General;  Laterality: Left;   CATARACT EXTRACTION W/PHACO Left  01/10/2014   Procedure: CATARACT EXTRACTION PHACO AND INTRAOCULAR LENS PLACEMENT ; CDE:  4.94;  Surgeon: Susa Simmonds, MD;  Location: [facility];  Service: Ophthalmology;  Laterality: Left;   CESAREAN SECTION  1986   CHOLECYSTECTOMY     COLONOSCOPY WITH PROPOFOL N/A 07/25/2020   Procedure: COLONOSCOPY WITH PROPOFOL;  Surgeon: Dolores Frame, MD;  [facility];  Service: Gastroenterology;  Laterality: N/A;  10:55   ESOPHAGOGASTRODUODENOSCOPY (EGD) WITH PROPOFOL N/A 08/29/2020   Procedure: ESOPHAGOGASTRODUODENOSCOPY (EGD) WITH PROPOFOL;  Surgeon: Dolores Frame, MD;  [facility];  Service: Gastroenterology;  Laterality: N/A;  12:00   ESOPHAGOGASTRODUODENOSCOPY (EGD) WITH PROPOFOL N/A 07/24/2022   Procedure: ESOPHAGOGASTRODUODENOSCOPY (EGD) WITH PROPOFOL;  Surgeon: Dolores Frame, MD;  [facility];  Service: Gastroenterology;  Laterality: N/A;  12:45;ASA 1-2   HOT HEMOSTASIS  07/24/2022   Procedure: HOT HEMOSTASIS (ARGON PLASMA COAGULATION/BICAP);  Surgeon: Marguerita Merles, Reuel Boom, MD;  [facility];  Service: Gastroenterology;;   IR 3D INDEPENDENT WKST  04/25/2021   IR ANGIO INTRA EXTRACRAN SEL COM CAROTID INNOMINATE UNI R MOD SED  04/25/2021   IR ANGIO INTRA EXTRACRAN SEL INTERNAL CAROTID UNI L MOD SED  04/25/2021   IR ANGIOGRAM FOLLOW UP STUDY  04/25/2021   IR CT HEAD LTD  04/25/2021   IR NEURO EACH ADD'L AFTER BASIC UNI LEFT (MS)  04/25/2021   IR RADIOLOGIST EVAL & MGMT  03/22/2021   IR RADIOLOGIST EVAL & MGMT  03/28/2021   IR RADIOLOGIST EVAL & MGMT  05/15/2021   IR RADIOLOGIST EVAL & MGMT  09/28/2021   IR TRANSCATH/EMBOLIZ  04/25/2021   IR US GUIDE VASC ACCESS RIGHT  04/25/2021   MYRINGOTOMY WITH TUBE PLACEMENT Right 02/04/2017   Procedure: REVISION OF RIGHT MYRINGOTOMY WITH TUBE PLACEMENT, WITH EXAM OF LEFT EAR;  Surgeon: Newman Pies, MD;  Location: [facility];  Service: ENT;  Laterality: Right;   NASAL SINUS SURGERY  2016    polypectomy   POLYPECTOMY  07/25/2020   Procedure: POLYPECTOMY;  Surgeon: Dolores Frame, MD;  [facility];  Service: Gastroenterology;;   POLYPECTOMY  08/29/2020   Procedure: POLYPECTOMY;  Surgeon: Dolores Frame, MD;  [facility];  Service: Gastroenterology;;   POLYPECTOMY  07/24/2022   Procedure: POLYPECTOMY;  Surgeon: Dolores Frame, MD;  [facility];  Service: Gastroenterology;;   [facility] REMOVAL N/A 01/06/2019   Procedure: REMOVAL PORT-A-CATH;  Surgeon: Claud Kelp, MD;  Location: [facility];  Service: General;  Laterality: N/A;   PORTACATH PLACEMENT Right 06/16/2018   Procedure: INSERTION PORT-A-CATH;  Surgeon: Claud Kelp, MD;  Location:  SURGERY CENTER;  Service: General;  Laterality: Right;   RADIOLOGY WITH ANESTHESIA N/A 04/25/2021   Procedure: IR WITH ANESTHESIA EMBOLIZATION;  Surgeon: Julieanne Cotton, MD;  Location: [facility];  Service: Radiology;  Laterality: N/A;   SCLEROTHERAPY  07/24/2022   Procedure: SCLEROTHERAPY;  Surgeon: Marguerita Merles, Reuel Boom, MD;  [facility];  Service: Gastroenterology;;   SUBMUCOSAL LIFTING INJECTION  08/29/2020   Procedure: SUBMUCOSAL LIFTING INJECTION;  Surgeon: Dolores Frame, MD;  [facility];  Service: Gastroenterology;;  SUBMUCOSAL LIFTING INJECTION  07/24/2022   Procedure: SUBMUCOSAL LIFTING INJECTION;  Surgeon: Marguerita Merles, Reuel Boom, MD;  [facility];  Service: Gastroenterology;;   TONSILLECTOMY  1970    Family History: Family History  Problem Relation Age of Onset   Lupus Mother    Heart attack Father        age 36   Prostate cancer Father    Hypothyroidism Sister    Breast cancer Sister    Hypertension Sister    Breast cancer Sister    Hypertension Brother    Parkinson's disease Paternal Aunt    Stroke Neg Hx     Social History: Social History   Tobacco Use  Smoking Status Never   Smokeless Tobacco Never   Social History   Substance and Sexual Activity  Alcohol Use No   Social History   Substance and Sexual Activity  Drug Use No    Allergies: Allergies  Allergen Reactions   Egg-Derived Products Nausea And Vomiting   Other Nausea And Vomiting    Dairy products\    Penicillins Hives    Has patient had a PCN reaction causing immediate rash, facial/tongue/throat swelling, SOB or lightheadedness with hypotension: No Has patient had a PCN reaction causing severe rash involving mucus membranes or skin necrosis: No Has patient had a PCN reaction that required hospitalization: No Has patient had a PCN reaction occurring within the last 10 years: No If all of the above answers are "NO", then may proceed with Cephalosporin use.     Medications: Current Outpatient Medications  Medication Sig Dispense Refill   acyclovir (ZOVIRAX) 400 MG tablet Take 400 mg by mouth 2 (two) times daily.      aspirin EC 81 MG EC tablet Take 1 tablet (81 mg total) by mouth daily. Swallow whole. 30 tablet 11   benzonatate (TESSALON) 100 MG capsule Take 100 mg by mouth 2 (two) times daily as needed for cough.     cetirizine (ZYRTEC) 10 MG tablet Take 10 mg by mouth daily as needed for allergies.     metoprolol succinate (TOPROL-XL) 25 MG 24 hr tablet Take 25 mg by mouth daily.  3   omeprazole (PRILOSEC) 20 MG capsule Take 1 capsule (20 mg total) by mouth daily. 90 capsule 3   PRESCRIPTION MEDICATION Pt states she takes a cholesterol medication, does not recall name/dosage     zolpidem (AMBIEN) 10 MG tablet Take 5 mg by mouth at bedtime as needed for sleep.     ZTLIDO 1.8 % PTCH Apply 1 patch topically every other day.     pramipexole (MIRAPEX) 0.75 MG tablet Take 1 tablet (0.75 mg total) by mouth 3 (three) times daily. (Patient not taking: Reported on 12/23/2022) 90 tablet 5   sucralfate (CARAFATE) 1 GM/10ML suspension TAKE 10 ML BY MOUTH TWICE DAILY (Patient not taking: Reported on  12/23/2022) 600 mL 0   No current facility-administered medications for this visit.    Review of Systems: GENERAL: negative for malaise, night sweats HEENT: No changes in hearing or vision, no nose bleeds or other nasal problems. NECK: Negative for lumps, goiter, pain and significant neck swelling RESPIRATORY: Negative for cough, wheezing CARDIOVASCULAR: Negative for chest pain, leg swelling, palpitations, orthopnea GI: SEE HPI MUSCULOSKELETAL: Negative for joint pain or swelling, back pain, and muscle pain. SKIN: Negative for lesions, rash PSYCH: Negative for sleep disturbance, mood disorder and recent psychosocial stressors. HEMATOLOGY Negative for prolonged bleeding, bruising easily, and swollen nodes. ENDOCRINE: Negative for cold  or heat intolerance, polyuria, polydipsia and goiter. NEURO: negative for tremor, gait imbalance, syncope and seizures. The remainder of the review of systems is noncontributory.   Physical Exam: BP (!) 155/84 (BP Location: Left Arm, Patient Position: Sitting, Cuff Size: Normal)   Pulse 71   Temp (!) 97.3 F (36.3 C) (Temporal)   Ht 5\' 3"  (1.6 m)   Wt 132 lb 8 oz (60.1 kg)   BMI 23.47 kg/m  GENERAL: The patient is AO x3, in no acute distress. HEENT: Head is normocephalic and atraumatic. EOMI are intact. Mouth is well hydrated and without lesions. NECK: Supple. No masses LUNGS: Clear to auscultation. No presence of rhonchi/wheezing/rales. Adequate chest expansion HEART: RRR, normal s1 and s2. ABDOMEN: Soft, nontender, no guarding, no peritoneal signs, and nondistended. BS +. No masses. EXTREMITIES: Without any cyanosis, clubbing, rash, lesions or edema. NEUROLOGIC: AOx3, no focal motor deficit. SKIN: no jaundice, no rashes  Imaging/Labs: as above  I personally reviewed and interpreted the available labs, imaging and endoscopic files.  Impression and Plan: Kimberly Mccormick is a 72 y.o. female with PMH breast cancer s/p , CHF, IBS, hyperlipidemia,  stroke, L internal carotid aneurysm s/p embolization, hypertension, who presents for follow-up of diarrhea and nausea with vomiting.  The patient had new onset of symptoms recently.  This raises the concern for a possible gastroenteritis as she was doing relatively well prior to this.  We discussed the possibility of stool testing if her diarrhea were to persist, which she is agreeable to have checked if still having symptoms on Thursday.  She would like to get Carafate refill as this has provided relief in the past.  -Try to stay hydrated, increase intake of water during the next week -Restart sucralfate every 6 hours as needed for nausea -If presenting persistent diarrhea on Thursday, proceed with stool testing  All questions were answered.      Katrinka Blazing, MD Gastroenterology and Hepatology [facility]

## 2022-12-24 ENCOUNTER — Encounter (HOSPITAL_COMMUNITY): Payer: Self-pay

## 2022-12-24 ENCOUNTER — Ambulatory Visit (HOSPITAL_COMMUNITY)
Admission: RE | Admit: 2022-12-24 | Discharge: 2022-12-24 | Disposition: A | Discharge status: Home / Self Care | Payer: 59 | Source: Ambulatory Visit | Attending: Hematology and Oncology

## 2022-12-24 ENCOUNTER — Ambulatory Visit (HOSPITAL_COMMUNITY)
Admission: RE | Admit: 2022-12-24 | Discharge: 2022-12-24 | Disposition: A | Discharge status: Home / Self Care | Payer: 59 | Source: Ambulatory Visit | Attending: Hematology and Oncology | Admitting: Hematology and Oncology

## 2022-12-24 DIAGNOSIS — D2371 Other benign neoplasm of skin of right lower limb, including hip: Secondary | ICD-10-CM | POA: Diagnosis not present

## 2022-12-24 DIAGNOSIS — Z170 Estrogen receptor positive status [ER+]: Secondary | ICD-10-CM | POA: Insufficient documentation

## 2022-12-24 DIAGNOSIS — N63 Unspecified lump in unspecified breast: Secondary | ICD-10-CM

## 2022-12-24 DIAGNOSIS — M79676 Pain in unspecified toe(s): Secondary | ICD-10-CM | POA: Diagnosis not present

## 2022-12-24 DIAGNOSIS — C50212 Malignant neoplasm of upper-inner quadrant of left female breast: Secondary | ICD-10-CM

## 2022-12-24 DIAGNOSIS — R92323 Mammographic fibroglandular density, bilateral breasts: Secondary | ICD-10-CM | POA: Diagnosis not present

## 2022-12-24 DIAGNOSIS — D2372 Other benign neoplasm of skin of left lower limb, including hip: Secondary | ICD-10-CM | POA: Diagnosis not present

## 2022-12-24 DIAGNOSIS — N6002 Solitary cyst of left breast: Secondary | ICD-10-CM | POA: Diagnosis not present

## 2022-12-30 ENCOUNTER — Ambulatory Visit (INDEPENDENT_AMBULATORY_CARE_PROVIDER_SITE_OTHER): Payer: 59 | Admitting: Gastroenterology

## 2022-12-31 ENCOUNTER — Ambulatory Visit (HOSPITAL_COMMUNITY): Payer: 59 | Admitting: Occupational Therapy

## 2022-12-31 ENCOUNTER — Other Ambulatory Visit: Payer: Self-pay

## 2022-12-31 ENCOUNTER — Encounter (HOSPITAL_COMMUNITY): Payer: Self-pay

## 2022-12-31 ENCOUNTER — Ambulatory Visit (HOSPITAL_COMMUNITY): Payer: 59 | Attending: Adult Health

## 2022-12-31 ENCOUNTER — Encounter (HOSPITAL_COMMUNITY): Payer: Self-pay | Admitting: Occupational Therapy

## 2022-12-31 DIAGNOSIS — R278 Other lack of coordination: Secondary | ICD-10-CM

## 2022-12-31 DIAGNOSIS — R29818 Other symptoms and signs involving the nervous system: Secondary | ICD-10-CM | POA: Insufficient documentation

## 2022-12-31 DIAGNOSIS — R471 Dysarthria and anarthria: Secondary | ICD-10-CM | POA: Insufficient documentation

## 2022-12-31 DIAGNOSIS — R2681 Unsteadiness on feet: Secondary | ICD-10-CM | POA: Insufficient documentation

## 2022-12-31 DIAGNOSIS — M6281 Muscle weakness (generalized): Secondary | ICD-10-CM | POA: Diagnosis not present

## 2022-12-31 DIAGNOSIS — G20A1 Parkinson's disease without dyskinesia, without mention of fluctuations: Secondary | ICD-10-CM | POA: Insufficient documentation

## 2022-12-31 DIAGNOSIS — M5459 Other low back pain: Secondary | ICD-10-CM | POA: Insufficient documentation

## 2022-12-31 NOTE — Therapy (Addendum)
OUTPATIENT OCCUPATIONAL THERAPY ORTHO EVALUATION  Patient Name: Kimberly Mccormick MRN: [id number] DOB:[birth year], 72 y.o., female Today's Date: 12/31/2022  PCP: Quintin Alto, MD REFERRING PROVIDER: Ihor Austin, NP  END OF SESSION:   12/31/22 1025  OT Visits / Re-Eval  Visit Number 1  Number of Visits 8  Date for OT Re-Evaluation 01/31/23  Authorization  Authorization Type [employer] Dual Complete  OT Time Calculation  OT Start Time 0935  OT Stop Time 1008  OT Time Calculation (min) 33 min  End of Session  Activity Tolerance Patient tolerated treatment well  Behavior During Therapy WFL for tasks assessed/performed     Past Medical History:  Diagnosis Date   Aneurysm of left internal carotid artery    Small supraclinoid 5 mm   Breast cancer (HCC)    Left s/p lumpectomy (05/06/2018) and adjuvant chemotherapy (06/23/2018) with Adriamycin and Cytoxan x4 followed by Taxol weekly x12   DDD (degenerative disc disease), lumbar    Essential hypertension    Genital warts    GERD (gastroesophageal reflux disease)    History of anemia    History of renal insufficiency    Hypertension    Insomnia    Iron deficiency anemia    Ischemic stroke [facility])    December 2022   Mixed hyperlipidemia    Osteoarthritis    Parkinson's disease (HCC)    Peripheral neuropathy    Related to chemotherapy   Personal history of chemotherapy 2020   Past Surgical History:  Procedure Laterality Date   APPENDECTOMY  1966   BIOPSY  08/29/2020   Procedure: BIOPSY;  Surgeon: Dolores Frame, MD;  [facility]: [facility];  Service: Gastroenterology;;   BIOPSY  07/24/2022   Procedure: BIOPSY;  Surgeon: Dolores Frame, MD;  [facility]: [facility];  Service: Gastroenterology;;   BREAST LUMPECTOMY WITH RADIOACTIVE SEED AND SENTINEL LYMPH NODE BIOPSY Left 05/06/2018   Procedure: LEFT BREAST LUMPECTOMY WITH RADIOACTIVE SEED AND LEFT DEEP AXILLARY SENTINEL LYMPH NODE BIOPSY AND BLUE DYE INJECTION;   Surgeon: Claud Kelp, MD;  Location: [facility];  Service: General;  Laterality: Left;   CATARACT EXTRACTION W/PHACO Left 01/10/2014   Procedure: CATARACT EXTRACTION PHACO AND INTRAOCULAR LENS PLACEMENT ; CDE:  4.94;  Surgeon: Susa Simmonds, MD;  Location: [facility];  Service: Ophthalmology;  Laterality: Left;   CESAREAN SECTION  1986   CHOLECYSTECTOMY     COLONOSCOPY WITH PROPOFOL N/A 07/25/2020   Procedure: COLONOSCOPY WITH PROPOFOL;  Surgeon: Dolores Frame, MD;  [facility]: [facility];  Service: Gastroenterology;  Laterality: N/A;  10:55   ESOPHAGOGASTRODUODENOSCOPY (EGD) WITH PROPOFOL N/A 08/29/2020   Procedure: ESOPHAGOGASTRODUODENOSCOPY (EGD) WITH PROPOFOL;  Surgeon: Dolores Frame, MD;  [facility]: [facility];  Service: Gastroenterology;  Laterality: N/A;  12:00   ESOPHAGOGASTRODUODENOSCOPY (EGD) WITH PROPOFOL N/A 07/24/2022   Procedure: ESOPHAGOGASTRODUODENOSCOPY (EGD) WITH PROPOFOL;  Surgeon: Dolores Frame, MD;  [facility];  Service: Gastroenterology;  Laterality: N/A;  12:45;ASA 1-2   HOT HEMOSTASIS  07/24/2022   Procedure: HOT HEMOSTASIS (ARGON PLASMA COAGULATION/BICAP);  Surgeon: Marguerita Merles, Reuel Boom, MD;  [facility]: [facility];  Service: Gastroenterology;;   IR 3D INDEPENDENT WKST  04/25/2021   IR ANGIO INTRA EXTRACRAN SEL COM CAROTID INNOMINATE UNI R MOD SED  04/25/2021   IR ANGIO INTRA EXTRACRAN SEL INTERNAL CAROTID UNI L MOD SED  04/25/2021   IR ANGIOGRAM FOLLOW UP STUDY  04/25/2021   IR CT HEAD LTD  04/25/2021   IR  NEURO EACH ADD'L AFTER BASIC UNI LEFT (MS)  04/25/2021   IR RADIOLOGIST EVAL & MGMT  03/22/2021   IR RADIOLOGIST EVAL & MGMT  03/28/2021   IR RADIOLOGIST EVAL & MGMT  05/15/2021   IR RADIOLOGIST EVAL & MGMT  09/28/2021   IR TRANSCATH/EMBOLIZ  04/25/2021   IR US GUIDE VASC ACCESS RIGHT  04/25/2021   MYRINGOTOMY WITH TUBE PLACEMENT Right 02/04/2017   Procedure: REVISION OF RIGHT MYRINGOTOMY WITH TUBE PLACEMENT, WITH EXAM  OF LEFT EAR;  Surgeon: Newman Pies, MD;  Location: [facility];  Service: ENT;  Laterality: Right;   NASAL SINUS SURGERY  2016   polypectomy   POLYPECTOMY  07/25/2020   Procedure: POLYPECTOMY;  Surgeon: Dolores Frame, MD;  [facility]: [facility];  Service: Gastroenterology;;   POLYPECTOMY  08/29/2020   Procedure: POLYPECTOMY;  Surgeon: Dolores Frame, MD;  [facility]: [facility];  Service: Gastroenterology;;   POLYPECTOMY  07/24/2022   Procedure: POLYPECTOMY;  Surgeon: Dolores Frame, MD;  [facility]: [facility];  Service: Gastroenterology;;   [facility] REMOVAL N/A 01/06/2019   Procedure: REMOVAL PORT-A-CATH;  Surgeon: Claud Kelp, MD;  Location: [facility];  Service: General;  Laterality: N/A;   PORTACATH PLACEMENT Right 06/16/2018   Procedure: INSERTION PORT-A-CATH;  Surgeon: Claud Kelp, MD;  Location: [facility];  Service: General;  Laterality: Right;   RADIOLOGY WITH ANESTHESIA N/A 04/25/2021   Procedure: IR WITH ANESTHESIA EMBOLIZATION;  Surgeon: Julieanne Cotton, MD;  Location: [facility];  Service: Radiology;  Laterality: N/A;   SCLEROTHERAPY  07/24/2022   Procedure: SCLEROTHERAPY;  Surgeon: Marguerita Merles, Reuel Boom, MD;  Location: [facility];  Service: Gastroenterology;;   SUBMUCOSAL LIFTING INJECTION  08/29/2020   Procedure: SUBMUCOSAL LIFTING INJECTION;  Surgeon: Marguerita Merles, Reuel Boom, MD;  [facility]: [facility];  Service: Gastroenterology;;   SUBMUCOSAL LIFTING INJECTION  07/24/2022   Procedure: SUBMUCOSAL LIFTING INJECTION;  Surgeon: Dolores Frame, MD;  [facility]: [facility];  Service: Gastroenterology;;   TONSILLECTOMY  1970   Patient Active Problem List   Diagnosis Date Noted   Acute diarrhea 12/23/2022   Functional dyspepsia 07/01/2022   DOE (dyspnea on exertion) 01/15/2022   Nausea with vomiting 05/28/2021   Duodenal adenoma 05/28/2021   Brain aneurysm 04/25/2021   History of  herpes simplex infection 01/08/2021   Papanicolaou smear, as part of routine gynecological examination 01/08/2021   CVA (cerebral vascular accident) (HCC) 01/04/2021   Acute ischemic stroke (HCC) 01/03/2021   Hypokalemia 01/03/2021   Cerebral aneurysm 01/03/2021   Musculoskeletal pain 01/03/2021   Early satiety 11/27/2020   Chronic abdominal pain 07/13/2020   Port-A-Cath in place 06/23/2018   Malignant neoplasm of upper-inner quadrant of left breast in female, estrogen receptor positive (HCC) 04/16/2018   Essential hypertension 03/21/2015   Hyperlipidemia 03/21/2015   Genital herpes 03/21/2015   Fecal urgency 03/21/2015   GERD (gastroesophageal reflux disease) 03/21/2015    ONSET DATE: ~2 years  REFERRING DIAG: Parkinson's Disease  THERAPY DIAG:  Other lack of coordination  Other symptoms and signs involving the nervous system  Rationale for Evaluation and Treatment: Rehabilitation  SUBJECTIVE:   SUBJECTIVE STATEMENT: "Sometimes I have a hard time moving my leg on the left side" Pt accompanied by: self  PERTINENT HISTORY: 71 y.o. female who is being followed for left-sided predominant Parkinson's disease.  Initially seen by Dr. Frances Furbish 11/2019 for several year history of intermittent left hand tremors and exam showed evidence of mild left-sided parkinsonism.  DaTscan 02/2020 supportive of diagnosis. Hx of right corona radiata infarct 12/2020 with incidental finding of cerebral aneurysm on workup s/p endovascular treatment with flow diverter device 04/2021.  PRECAUTIONS: Fall  WEIGHT BEARING RESTRICTIONS: No  PAIN:  Are you having pain? No  FALLS: Has patient fallen in last 6 months? No  LIVING ENVIRONMENT: Lives with: lives with their son Lives in: House/apartment Stairs: Yes: External: 3-4 steps; on right going up Has following equipment at home: None  PLOF: Independent  PATIENT GOALS: "I want to get back to feeling more normal"  NEXT MD VISIT: March 19th,  2025  OBJECTIVE:  Note: Objective measures were completed at Evaluation unless otherwise noted.  HAND DOMINANCE: Right  ADLs: Overall ADLs: Pt requires min to mod assist with dressing and grooming due to tremors and weakness/motor planning. Pt also reports that her son has to assist with cooking and cleaning, due to inability to lift most pots and pans, as well as cleaning equipment.   FUNCTIONAL OUTCOME MEASURES: Upper Extremity Functional Scale (UEFS): 39/80 - 48.8%  UPPER EXTREMITY ROM:     BUE WFL BUE able to make full fists  UPPER EXTREMITY MMT:     MMT Right eval Left eval  Shoulder flexion 4+/5 4+/5  Shoulder abduction 4+/5 4/5  Shoulder internal rotation 5/5 5/5  Shoulder external rotation 5/5 5/5  Elbow flexion 5/5 4+/5  Elbow extension 4+/5 4/5  Wrist flexion 5/5 5/5  Wrist extension 5/5 5/5  Wrist ulnar deviation 5/5 5/5  Wrist radial deviation 5/5 5/5  Wrist pronation 5/5 5/5  Wrist supination 5/5 5/5  (Blank rows = not tested)  HAND FUNCTION: Grip strength: Right: 35 lbs; Left: 26 lbs, Lateral pinch: Right: 11 lbs, Left: 8 lbs, and 3 point pinch: Right: 8 lbs, Left: 6 lbs  COORDINATION: 9 Hole Peg test: Right: 22.14 sec; Left: 32.10 sec  SENSATION: WFL  EDEMA: No Swelling noted  OBSERVATIONS: Bilateral tremors noted, especially with increased effort.   TODAY'S TREATMENT:                                                                                                                              DATE: 12/31/22: Evaluation Only   PATIENT EDUCATION: Education details: Will start next session Person educated: Patient Education method: Explanation Education comprehension: verbalized understanding  HOME EXERCISE PROGRAM: Will start next sessoin  GOALS: Goals reviewed with patient? Yes  SHORT TERM GOALS: Target date: 01/31/23  Pt will be provided and educated on HEP for BUE strength and mobility for ADL completion.   Goal status:  INITIAL  2.  Pt will increase independence in mobility and meal preparation by performing a simple meal prep task with independence  Goal status: INITIAL  3.  Pt will increase BUE strength to 5/5 in order to lift and carry items during cooking and cleaning tasks, independently.  Goal status: INITIAL  4.  Pt will increase BUE grip strength by 10# and  pinch strength by 2# to improve ability to grasp items and use bilateral integration to perform ADLs.   Goal status: INITIAL  5.  Pt will be educated on AE and DME available for use during ADLs to improve safety and independence.   Goal status: INITIAL   ASSESSMENT:  CLINICAL IMPRESSION: Patient is a 72 y.o. female who was seen today for occupational therapy evaluation for increased weakness and tremors due to Parkinson's disease. Pt presents with mild coordination deficits, weakness, and decreased activity tolerance, limiting her ability to complete ADL's and IADL's independently and efficiently.    PERFORMANCE DEFICITS: in functional skills including ADLs, IADLs, coordination, ROM, strength, fascial restrictions, muscle spasms, Fine motor control, Gross motor control, balance, body mechanics, endurance, decreased knowledge of use of DME, and UE functional use, cognitive skills including attention, energy/drive, and safety awareness, and psychosocial skills including coping strategies and environmental adaptation.   IMPAIRMENTS: are limiting patient from ADLs, IADLs, rest and sleep, leisure, and social participation.   COMORBIDITIES: may have co-morbidities  that affects occupational performance. Patient will benefit from skilled OT to address above impairments and improve overall function.  MODIFICATION OR ASSISTANCE TO COMPLETE EVALUATION: No modification of tasks or assist necessary to complete an evaluation.  OT OCCUPATIONAL PROFILE AND HISTORY: Problem focused assessment: Including review of records relating to presenting  problem.  CLINICAL DECISION MAKING: LOW - limited treatment options, no task modification necessary  REHAB POTENTIAL: Good  EVALUATION COMPLEXITY: Low      PLAN:  OT FREQUENCY: 1-2x/week  OT DURATION: 4 weeks  PLANNED INTERVENTIONS: 97168 OT Re-evaluation, 97535 self care/ADL training, 16109 therapeutic exercise, 97530 therapeutic activity, 97112 neuromuscular re-education, 97140 manual therapy, 97035 ultrasound, 97010 moist heat, 97032 electrical stimulation (manual), passive range of motion, balance training, visual/perceptual remediation/compensation, energy conservation, coping strategies training, patient/family education, and DME and/or AE instructions  RECOMMENDED OTHER SERVICES: PT and Speech  CONSULTED AND AGREED WITH PLAN OF CARE: Patient  PLAN FOR NEXT SESSION: Strengthening, grip and pinch strengthening, endurance based tasks   Trish Mage, OTR/L [facility] [phone] Kennyth Arnold, OT 12/31/2022, 10:28 AM

## 2022-12-31 NOTE — Therapy (Signed)
OUTPATIENT PHYSICAL THERAPY EVALUATION   Patient Name: Kimberly Mccormick MRN: [id number] DOB:May 07, 1950, 72 y.o., female Today's Date: 12/31/2022  END OF SESSION:  PT End of Session - 12/31/22 1058     Visit Number 1    Date for PT Re-Evaluation 02/11/23    Authorization Type [employer] Dual Complete    Authorization Time Period no auth; no limit    Progress Note Due on Visit 10    PT Start Time 1015    PT Stop Time 1050    PT Time Calculation (min) 35 min    Equipment Utilized During Treatment Gait belt    Activity Tolerance Patient tolerated treatment well    Behavior During Therapy WFL for tasks assessed/performed             Past Medical History:  Diagnosis Date   Aneurysm of left internal carotid artery    Small supraclinoid 5 mm   Breast cancer (HCC)    Left s/p lumpectomy (05/06/2018) and adjuvant chemotherapy (06/23/2018) with Adriamycin and Cytoxan x4 followed by Taxol weekly x12   DDD (degenerative disc disease), lumbar    Essential hypertension    Genital warts    GERD (gastroesophageal reflux disease)    History of anemia    History of renal insufficiency    Hypertension    Insomnia    Iron deficiency anemia    Ischemic stroke [facility])    December 2022   Mixed hyperlipidemia    Osteoarthritis    Parkinson's disease (HCC)    Peripheral neuropathy    Related to chemotherapy   Personal history of chemotherapy 2020   Past Surgical History:  Procedure Laterality Date   APPENDECTOMY  1966   BIOPSY  08/29/2020   Procedure: BIOPSY;  Surgeon: Dolores Frame, MD;  [facility];  Service: Gastroenterology;;   BIOPSY  07/24/2022   Procedure: BIOPSY;  Surgeon: Dolores Frame, MD;  [facility];  Service: Gastroenterology;;   BREAST LUMPECTOMY WITH RADIOACTIVE SEED AND SENTINEL LYMPH NODE BIOPSY Left 05/06/2018   Procedure: LEFT BREAST LUMPECTOMY WITH RADIOACTIVE SEED AND LEFT DEEP AXILLARY SENTINEL LYMPH NODE BIOPSY AND BLUE DYE  INJECTION;  Surgeon: Claud Kelp, MD;  Location: [facility];  Service: General;  Laterality: Left;   CATARACT EXTRACTION W/PHACO Left 01/10/2014   Procedure: CATARACT EXTRACTION PHACO AND INTRAOCULAR LENS PLACEMENT ; CDE:  4.94;  Surgeon: Susa Simmonds, MD;  Location: [facility];  Service: Ophthalmology;  Laterality: Left;   CESAREAN SECTION  1986   CHOLECYSTECTOMY     COLONOSCOPY WITH PROPOFOL N/A 07/25/2020   Procedure: COLONOSCOPY WITH PROPOFOL;  Surgeon: Dolores Frame, MD;  [facility];  Service: Gastroenterology;  Laterality: N/A;  10:55   ESOPHAGOGASTRODUODENOSCOPY (EGD) WITH PROPOFOL N/A 08/29/2020   Procedure: ESOPHAGOGASTRODUODENOSCOPY (EGD) WITH PROPOFOL;  Surgeon: Dolores Frame, MD;  [facility];  Service: Gastroenterology;  Laterality: N/A;  12:00   ESOPHAGOGASTRODUODENOSCOPY (EGD) WITH PROPOFOL N/A 07/24/2022   Procedure: ESOPHAGOGASTRODUODENOSCOPY (EGD) WITH PROPOFOL;  Surgeon: Dolores Frame, MD;  [facility];  Service: Gastroenterology;  Laterality: N/A;  12:45;ASA 1-2   HOT HEMOSTASIS  07/24/2022   Procedure: HOT HEMOSTASIS (ARGON PLASMA COAGULATION/BICAP);  Surgeon: Marguerita Merles, Reuel Boom, MD;  [facility];  Service: Gastroenterology;;   IR 3D INDEPENDENT WKST  04/25/2021   IR ANGIO INTRA EXTRACRAN SEL COM CAROTID INNOMINATE UNI R MOD SED  04/25/2021   IR ANGIO INTRA EXTRACRAN SEL INTERNAL  CAROTID UNI L MOD SED  04/25/2021   IR ANGIOGRAM FOLLOW UP STUDY  04/25/2021   IR CT HEAD LTD  04/25/2021   IR NEURO EACH ADD'L AFTER BASIC UNI LEFT (MS)  04/25/2021   IR RADIOLOGIST EVAL & MGMT  03/22/2021   IR RADIOLOGIST EVAL & MGMT  03/28/2021   IR RADIOLOGIST EVAL & MGMT  05/15/2021   IR RADIOLOGIST EVAL & MGMT  09/28/2021   IR TRANSCATH/EMBOLIZ  04/25/2021   IR US GUIDE VASC ACCESS RIGHT  04/25/2021   MYRINGOTOMY WITH TUBE PLACEMENT Right 02/04/2017   Procedure: REVISION OF RIGHT MYRINGOTOMY WITH TUBE  PLACEMENT, WITH EXAM OF LEFT EAR;  Surgeon: Newman Pies, MD;  Location: [facility];  Service: ENT;  Laterality: Right;   NASAL SINUS SURGERY  2016   polypectomy   POLYPECTOMY  07/25/2020   Procedure: POLYPECTOMY;  Surgeon: Dolores Frame, MD;  [facility];  Service: Gastroenterology;;   POLYPECTOMY  08/29/2020   Procedure: POLYPECTOMY;  Surgeon: Dolores Frame, MD;  [facility];  Service: Gastroenterology;;   POLYPECTOMY  07/24/2022   Procedure: POLYPECTOMY;  Surgeon: Dolores Frame, MD;  [facility];  Service: Gastroenterology;;   [facility] REMOVAL N/A 01/06/2019   Procedure: REMOVAL PORT-A-CATH;  Surgeon: Claud Kelp, MD;  Location: [facility];  Service: General;  Laterality: N/A;   PORTACATH PLACEMENT Right 06/16/2018   Procedure: INSERTION PORT-A-CATH;  Surgeon: Claud Kelp, MD;  Location: [facility];  Service: General;  Laterality: Right;   RADIOLOGY WITH ANESTHESIA N/A 04/25/2021   Procedure: IR WITH ANESTHESIA EMBOLIZATION;  Surgeon: Julieanne Cotton, MD;  Location: [facility];  Service: Radiology;  Laterality: N/A;   SCLEROTHERAPY  07/24/2022   Procedure: SCLEROTHERAPY;  Surgeon: Marguerita Merles, Reuel Boom, MD;  [facility];  Service: Gastroenterology;;   SUBMUCOSAL LIFTING INJECTION  08/29/2020   Procedure: SUBMUCOSAL LIFTING INJECTION;  Surgeon: Marguerita Merles, Reuel Boom, MD;  [facility];  Service: Gastroenterology;;   SUBMUCOSAL LIFTING INJECTION  07/24/2022   Procedure: SUBMUCOSAL LIFTING INJECTION;  Surgeon: Dolores Frame, MD;  [facility];  Service: Gastroenterology;;   TONSILLECTOMY  1970   Patient Active Problem List   Diagnosis Date Noted   Acute diarrhea 12/23/2022   Functional dyspepsia 07/01/2022   DOE (dyspnea on exertion) 01/15/2022   Nausea with vomiting 05/28/2021   Duodenal adenoma 05/28/2021   Brain aneurysm  04/25/2021   History of herpes simplex infection 01/08/2021   Papanicolaou smear, as part of routine gynecological examination 01/08/2021   CVA (cerebral vascular accident) (HCC) 01/04/2021   Acute ischemic stroke (HCC) 01/03/2021   Hypokalemia 01/03/2021   Cerebral aneurysm 01/03/2021   Musculoskeletal pain 01/03/2021   Early satiety 11/27/2020   Chronic abdominal pain 07/13/2020   Port-A-Cath in place 06/23/2018   Malignant neoplasm of upper-inner quadrant of left breast in female, estrogen receptor positive (HCC) 04/16/2018   Essential hypertension 03/21/2015   Hyperlipidemia 03/21/2015   Genital herpes 03/21/2015   Fecal urgency 03/21/2015   GERD (gastroesophageal reflux disease) 03/21/2015    PCP: Juliette Alcide MD  REFERRING PROVIDER:   Ihor Austin, NP    REFERRING DIAG:  G20.A1 (ICD-10-CM) - Parkinson's disease without dyskinesia, without mention of fluctuations  M54.50 (ICD-10-CM) - Low back pain, unspecified  G89.29 (ICD-10-CM) - Other chronic pain    Rationale for Evaluation and Treatment: Rehabilitation  THERAPY DIAG:  Muscle weakness (generalized)  Parkinson's disease without dyskinesia or fluctuating  manifestations (HCC)  Other low back pain  ONSET DATE: "some years ago"  SUBJECTIVE:                                                                                                                                                                                           SUBJECTIVE STATEMENT: Pt reports that she started noticing issues with back when she was having trouble participating in church standing activities. She reports sharp pain that tires her out when standing and performing various forms of ADLs daily. Pt reports daily pain that is sometimes all days and sometimes intermittent in the day. Pt reports she keeps moving and does not like to sit down. Located just in the low back, no traveling. Pt enjoys dancing and wants to be able to dance at  upcoming birthday party in January.   PERTINENT HISTORY:  Parkinson's Disease Hx of Right Corona Radiata Infarct Cerebral Aneurysm  PAIN:  Are you having pain? Yes: NPRS scale: 7/10 Pain location: Low back Pain description: sharp and lingering occasionally Aggravating factors: Prolonged movement Relieving factors: Laying down and some OTC pain meds  PRECAUTIONS: None  RED FLAGS: None   WEIGHT BEARING RESTRICTIONS: No  FALLS:  Has patient fallen in last 6 months? No  PLOF: Independent  PATIENT GOALS: "I want my legs to get stronger"  NEXT MD VISIT: in 4 months  OBJECTIVE:  Note: Objective measures were completed at Evaluation unless otherwise noted.  DIAGNOSTIC FINDINGS:    PATIENT SURVEYS:  Modified Oswestry 10/50 or 20%   SCREENING FOR RED FLAGS: Bowel or bladder incontinence: No Spinal tumors: No Cauda equina syndrome: No Compression fracture: No Abdominal aneurysm: No  COGNITION: Overall cognitive status: Within functional limits for tasks assessed    SENSATION: WFL   POSTURE: rounded shoulders, forward head, decreased lumbar lordosis, increased thoracic kyphosis, posterior pelvic tilt, and flexed trunk   PALPATION: Non tender to palpate along lumbar paraspinals  LUMBAR ROM:   AROM eval  Flexion WFL  Extension WFL  Right lateral flexion WFL  Left lateral flexion WFL  Right rotation 25%  Left rotation 25%   (Blank rows = not tested)  LOWER EXTREMITY ROM:     Active  Right eval Left eval  Hip flexion    Hip extension    Hip abduction    Hip adduction    Hip internal rotation    Hip external rotation    Knee flexion    Knee extension    Ankle dorsiflexion    Ankle plantarflexion    Ankle inversion    Ankle eversion     (Blank rows = not  tested)  LOWER EXTREMITY MMT:    MMT Right eval Left eval  Hip flexion 3+ 3+  Hip extension 3- 3-  Hip abduction 3+ 3-  Hip adduction    Hip internal rotation    Hip external rotation     Knee flexion    Knee extension 4+ 4-  Ankle dorsiflexion    Ankle plantarflexion    Ankle inversion    Ankle eversion     (Blank rows = not tested)  LUMBAR SPECIAL TESTS:  Slump test: Negative  FUNCTIONAL TESTS:  30 seconds chair stand test: 10x- average DGI 1. Gait level surface (2) Mild Impairment: Walks 20', uses assistive devices, slower speed, mild gait deviations. 2. Change in gait speed (2) Mild Impairment: Is able to change speed but demonstrates mild gait deviations, or not gait deviations but unable to achieve a significant change in velocity, or uses an assistive device. 3. Gait with horizontal head turns (2) Mild Impairment: Performs head turns smoothly with slight change in gait velocity, i.e., minor disruption to smooth gait path or uses walking aid. 4. Gait with vertical head turns (2) Mild Impairment: Performs head turns smoothly with slight change in gait velocity, i.e., minor disruption to smooth gait path or uses walking aid. 5. Gait and pivot turn (3) Normal: Pivot turns safely within 3 seconds and stops quickly with no loss of balance. 6. Step over obstacle (2) Mild Impairment: Is able to step over box, but must slow down and adjust steps to clear box safely. 7. Step around obstacles (2) Mild Impairment: Is able to step around both cones, but must slow down and adjust steps to clear cones. 8. Stairs (2) Mild Impairment: Alternating feet, must use rail.  TOTAL SCORE: 17 / 24    GAIT: Distance walked: >50 ft Assistive device utilized: None Level of assistance: Complete Independence Comments: slow, guarded gait pattern with shuffling pattern, minor lateral sways during ambulation with independence to correct.   TODAY'S TREATMENT:                                                                                                                              DATE: 12/31/2022 PT Evaluation    PATIENT EDUCATION:  Education details: PT Evaluation, findings,  prognosis, frequency, attendance policy, and HEP if given.  Person educated: Patient Education method: Medical illustrator Education comprehension: verbalized understanding  HOME EXERCISE PROGRAM: Postural awareness. Official HEP to initiate next session.   ASSESSMENT:  CLINICAL IMPRESSION: Patient is a 72 y.o. female who was seen today for physical therapy evaluation and treatment for  G20.A1 (ICD-10-CM) - Parkinson's disease without dyskinesia, without mention of fluctuations  M54.50 (ICD-10-CM) - Low back pain, unspecified  G89.29 (ICD-10-CM) - Other chronic pain  Pt is demonstrating limitations in functional transfers, mobility, and safety during ambulation with increased fall risks due to generalized muscle weakness, reduced balance reactions, reduced postural awareness, and mild amounts of LBP. Pt will benefit from  skilled Physical Therapy services to address deficits/limitations in order to improve functional and QOL.    OBJECTIVE IMPAIRMENTS: Abnormal gait, decreased balance, decreased cognition, decreased mobility, difficulty walking, decreased strength, and pain.   ACTIVITY LIMITATIONS: carrying, lifting, bending, standing, transfers, and locomotion level  PARTICIPATION LIMITATIONS: community activity and church  PERSONAL FACTORS: Age and 3+ comorbidities: Parkinson's Disease, previous CVA and cerebral aneurysm  are also affecting patient's functional outcome.   REHAB POTENTIAL: Good  CLINICAL DECISION MAKING: Evolving/moderate complexity  EVALUATION COMPLEXITY: Moderate   GOALS: Goals reviewed with patient? No  SHORT TERM GOALS: Target date: 01/21/2023  Pt will be independent with HEP in order to demonstrate participation in Physical Therapy POC.  Baseline: Goal status: INITIAL  2.  Pt will report 4/10 pain with mobility in order to demonstrate improved pain with functional activities.  Baseline:  Goal status: INITIAL  LONG TERM GOALS: Target date:  02/11/2023  Pt will improve DGI by >4 in order to demonstrate improved functional strength to return to desired activities.  Baseline: See objective.  Goal status: INITIAL  2.  Pt will improve BLE MMT by > 1/2 grade in order to demonstrate improved functional functional capacity during ADLs and recreational activities.  Baseline: See objective.  Goal status: INITIAL  3.  Pt will improve Modified Owestry score by 3 points in order to demonstrate improved pain with functional goals and outcomes. Baseline: See objective.  Goal status: INITIAL  4.  Pt will report 1/10 pain with mobility in order to demonstrate reduced pain with overall functional status.  Baseline: See objective.  Goal status: INITIAL  PLAN:  PT FREQUENCY: 2x/week  PT DURATION: 6 weeks  PLANNED INTERVENTIONS: 97164- PT Re-evaluation, 97110-Therapeutic exercises, 97530- Therapeutic activity, 97112- Neuromuscular re-education, 97535- Self Care, 19147- Manual therapy, [phone]- Gait training, Balance training, and Joint mobilization.  PLAN FOR NEXT SESSION: BLE strengthening, balance, stepping reactions, turning.   Nelida Meuse PT, DPT [facility]- [facility] [phone] office   Nelida Meuse, PT 12/31/2022, 11:00 AM

## 2023-01-02 ENCOUNTER — Ambulatory Visit (HOSPITAL_COMMUNITY): Payer: 59 | Admitting: Speech Pathology

## 2023-01-02 ENCOUNTER — Encounter (HOSPITAL_COMMUNITY): Payer: Self-pay | Admitting: Speech Pathology

## 2023-01-02 DIAGNOSIS — M6281 Muscle weakness (generalized): Secondary | ICD-10-CM | POA: Diagnosis not present

## 2023-01-02 DIAGNOSIS — R2681 Unsteadiness on feet: Secondary | ICD-10-CM | POA: Diagnosis not present

## 2023-01-02 DIAGNOSIS — R278 Other lack of coordination: Secondary | ICD-10-CM | POA: Diagnosis not present

## 2023-01-02 DIAGNOSIS — R471 Dysarthria and anarthria: Secondary | ICD-10-CM

## 2023-01-02 DIAGNOSIS — M5459 Other low back pain: Secondary | ICD-10-CM | POA: Diagnosis not present

## 2023-01-02 DIAGNOSIS — R29818 Other symptoms and signs involving the nervous system: Secondary | ICD-10-CM | POA: Diagnosis not present

## 2023-01-02 DIAGNOSIS — G20A1 Parkinson's disease without dyskinesia, without mention of fluctuations: Secondary | ICD-10-CM | POA: Diagnosis not present

## 2023-01-02 NOTE — Therapy (Signed)
OUTPATIENT SPEECH LANGUAGE PATHOLOGY PARKINSON'S EVALUATION   Patient Name: Kimberly Mccormick MRN: [id number] DOB:06/20/1950, 72 y.o., female Today's Date: 01/02/2023  PCP: Juliette Alcide, MD REFERRING PROVIDER: Ihor Austin, NP  END OF SESSION:  End of Session - 01/02/23 1145     Visit Number 1    Number of Visits 10    Date for SLP Re-Evaluation 03/20/23    Authorization Type [employer] Dual Complete   [facility] eff: 01/21/22 ded: 240 met oop: 8850 coins 20% [facility] portal   SLP Start Time 1110    SLP Stop Time  1155    SLP Time Calculation (min) 45 min    Activity Tolerance Patient tolerated treatment well             Past Medical History:  Diagnosis Date   Aneurysm of left internal carotid artery    Small supraclinoid 5 mm   Breast cancer (HCC)    Left s/p lumpectomy (05/06/2018) and adjuvant chemotherapy (06/23/2018) with Adriamycin and Cytoxan x4 followed by Taxol weekly x12   DDD (degenerative disc disease), lumbar    Essential hypertension    Genital warts    GERD (gastroesophageal reflux disease)    History of anemia    History of renal insufficiency    Hypertension    Insomnia    Iron deficiency anemia    Ischemic stroke [facility])    December 2022   Mixed hyperlipidemia    Osteoarthritis    Parkinson's disease (HCC)    Peripheral neuropathy    Related to chemotherapy   Personal history of chemotherapy 2020   Past Surgical History:  Procedure Laterality Date   APPENDECTOMY  1966   BIOPSY  08/29/2020   Procedure: BIOPSY;  Surgeon: Dolores Frame, MD;  [facility];  Service: Gastroenterology;;   BIOPSY  07/24/2022   Procedure: BIOPSY;  Surgeon: Dolores Frame, MD;  [facility];  Service: Gastroenterology;;   BREAST LUMPECTOMY WITH RADIOACTIVE SEED AND SENTINEL LYMPH NODE BIOPSY Left 05/06/2018   Procedure: LEFT BREAST LUMPECTOMY WITH RADIOACTIVE SEED AND LEFT DEEP AXILLARY SENTINEL LYMPH NODE BIOPSY AND BLUE DYE  INJECTION;  Surgeon: Claud Kelp, MD;  Location[facility];  Service: General;  Laterality: Left;   CATARACT EXTRACTION W/PHACO Left 01/10/2014   Procedure: CATARACT EXTRACTION PHACO AND INTRAOCULAR LENS PLACEMENT ; CDE:  4.94;  Surgeon: Susa Simmonds, MD;  Location: [facility];  Service: Ophthalmology;  Laterality: Left;   CESAREAN SECTION  1986   CHOLECYSTECTOMY     COLONOSCOPY WITH PROPOFOL N/A 07/25/2020   Procedure: COLONOSCOPY WITH PROPOFOL;  Surgeon: Dolores Frame, MD;  [facility];  Service: Gastroenterology;  Laterality: N/A;  10:55   ESOPHAGOGASTRODUODENOSCOPY (EGD) WITH PROPOFOL N/A 08/29/2020   Procedure: ESOPHAGOGASTRODUODENOSCOPY (EGD) WITH PROPOFOL;  Surgeon: Dolores Frame, MD;  [facility];  Service: Gastroenterology;  Laterality: N/A;  12:00   ESOPHAGOGASTRODUODENOSCOPY (EGD) WITH PROPOFOL N/A 07/24/2022   Procedure: ESOPHAGOGASTRODUODENOSCOPY (EGD) WITH PROPOFOL;  Surgeon: Dolores Frame, MD;  [facility];  Service: Gastroenterology;  Laterality: N/A;  12:45;ASA 1-2   HOT HEMOSTASIS  07/24/2022   Procedure: HOT HEMOSTASIS (ARGON PLASMA COAGULATION/BICAP);  Surgeon: Marguerita Merles, Reuel Boom, MD;  [facility];  Service: Gastroenterology;;   IR 3D INDEPENDENT WKST  04/25/2021   IR ANGIO INTRA EXTRACRAN SEL COM CAROTID INNOMINATE UNI R MOD SED  04/25/2021   IR ANGIO INTRA EXTRACRAN SEL INTERNAL CAROTID UNI L MOD  SED  04/25/2021   IR ANGIOGRAM FOLLOW UP STUDY  04/25/2021   IR CT HEAD LTD  04/25/2021   IR NEURO EACH ADD'L AFTER BASIC UNI LEFT (MS)  04/25/2021   IR RADIOLOGIST EVAL & MGMT  03/22/2021   IR RADIOLOGIST EVAL & MGMT  03/28/2021   IR RADIOLOGIST EVAL & MGMT  05/15/2021   IR RADIOLOGIST EVAL & MGMT  09/28/2021   IR TRANSCATH/EMBOLIZ  04/25/2021   IR US GUIDE VASC ACCESS RIGHT  04/25/2021   MYRINGOTOMY WITH TUBE PLACEMENT Right 02/04/2017   Procedure: REVISION OF RIGHT MYRINGOTOMY WITH TUBE  PLACEMENT, WITH EXAM OF LEFT EAR;  Surgeon: Newman Pies, MD;  Location: [facility];  Service: ENT;  Laterality: Right;   NASAL SINUS SURGERY  2016   polypectomy   POLYPECTOMY  07/25/2020   Procedure: POLYPECTOMY;  Surgeon: Dolores Frame, MD;  [facility];  Service: Gastroenterology;;   POLYPECTOMY  08/29/2020   Procedure: POLYPECTOMY;  Surgeon: Dolores Frame, MD;  [facility];  Service: Gastroenterology;;   POLYPECTOMY  07/24/2022   Procedure: POLYPECTOMY;  Surgeon: Dolores Frame, MD;  [facility];  Service: Gastroenterology;;   [facility] REMOVAL N/A 01/06/2019   Procedure: REMOVAL PORT-A-CATH;  Surgeon: Claud Kelp, MD;  Location: [facility];  Service: General;  Laterality: N/A;   PORTACATH PLACEMENT Right 06/16/2018   Procedure: INSERTION PORT-A-CATH;  Surgeon: Claud Kelp, MD;  Location: [facility];  Service: General;  Laterality: Right;   RADIOLOGY WITH ANESTHESIA N/A 04/25/2021   Procedure: IR WITH ANESTHESIA EMBOLIZATION;  Surgeon: Julieanne Cotton, MD;  Location: [facility];  Service: Radiology;  Laterality: N/A;   SCLEROTHERAPY  07/24/2022   Procedure: SCLEROTHERAPY;  Surgeon: Marguerita Merles, Reuel Boom, MD;  [facility];  Service: Gastroenterology;;   SUBMUCOSAL LIFTING INJECTION  08/29/2020   Procedure: SUBMUCOSAL LIFTING INJECTION;  Surgeon: Marguerita Merles, Reuel Boom, MD;  [facility];  Service: Gastroenterology;;   Sunnie Nielsen LIFTING INJECTION  07/24/2022   Procedure: SUBMUCOSAL LIFTING INJECTION;  Surgeon: Dolores Frame, MD;  [facility];  Service: Gastroenterology;;   TONSILLECTOMY  1970   Patient Active Problem List   Diagnosis Date Noted   Acute diarrhea 12/23/2022   Functional dyspepsia 07/01/2022   DOE (dyspnea on exertion) 01/15/2022   Nausea with vomiting 05/28/2021   Duodenal adenoma 05/28/2021   Brain aneurysm  04/25/2021   History of herpes simplex infection 01/08/2021   Papanicolaou smear, as part of routine gynecological examination 01/08/2021   CVA (cerebral vascular accident) (HCC) 01/04/2021   Acute ischemic stroke (HCC) 01/03/2021   Hypokalemia 01/03/2021   Cerebral aneurysm 01/03/2021   Musculoskeletal pain 01/03/2021   Early satiety 11/27/2020   Chronic abdominal pain 07/13/2020   Port-A-Cath in place 06/23/2018   Malignant neoplasm of upper-inner quadrant of left breast in female, estrogen receptor positive (HCC) 04/16/2018   Essential hypertension 03/21/2015   Hyperlipidemia 03/21/2015   Genital herpes 03/21/2015   Fecal urgency 03/21/2015   GERD (gastroesophageal reflux disease) 03/21/2015    ONSET DATE: 11/2019  REFERRING DIAG: G20.A1 (ICD-10-CM) - Parkinson's disease without dyskinesia, without mention of fluctuations  THERAPY DIAG:  Dysarthria  Rationale for Evaluation and Treatment: Rehabilitation  SUBJECTIVE:   SUBJECTIVE STATEMENT: "I feel like I don't speak loud." Pt accompanied by: self  PERTINENT HISTORY: MELISAA VANDENAKKER is a 72 y.o. female who is referred by Ihor Austin, NP (neurology) for SLP evaluation and treatment in setting  of left-sided predominant Parkinson's disease.  Initially seen by Dr. Frances Furbish 11/2019 for several year history of intermittent left hand tremors and exam showed evidence of mild left-sided parkinsonism.  DaTscan 02/2020 supportive of diagnosis. Hx of right corona radiata infarct 12/2020 with incidental finding of cerebral aneurysm on workup s/p endovascular treatment with flow diverter device 04/2021 by Dr. Corliss Skains.  PAIN:  Are you having pain? No  FALLS: Has patient fallen in last 6 months?  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  DIAGNOSTIC FINDINGS:  04/15/2022 MRI HEAD FINDINGS IMPRESSION: 1. No acute brain finding. Moderate chronic small-vessel ischemic changes of the cerebral hemispheric white matter. 2. Left internal carotid artery flow diverting stent. No residual visualized flow in the region of the medially directed aneurysm. 3. 4 mm aneurysm projecting laterally from the petrous carotid on the left. No change.  02/23/2020 NUCLEAR MEDICINE BRAIN IMAGING WITH SPECT (DaTscan ) IMPRESSION: Significant decreased striatal Ioflupane activity most prominent in the posterior striatum. This pattern can be seen in Parkinsonian syndromes.   Of note, DaTSCAN is not diagnostic of Parkinsonian syndromes, which remains a clinical diagnosis. DaTscan is an adjuvant test to aid in the clinical diagnosis of Parkinsonian syndromes.  COGNITION: Overall cognitive status: Impaired Areas of impairment: Attention Comments: [facility] SLUMS below  MOTOR SPEECH: Overall motor speech: impaired Level of impairment: Sentence Respiration: speaking on residual capacity Phonation: hoarse, low vocal intensity, and fluctuations, glottal fry Resonance: WFL Articulation: Appears intact Intelligibility: Intelligibility reduced Motor planning: Appears intact Motor speech errors: aware and unaware Interfering components:  N/A Effective technique: increased vocal intensity and pacing  ORAL MOTOR EXAMINATION: Overall status: WFL Comments: N/A  OBJECTIVE VOICE ASSESSMENT: Sustained "ah" maximum phonation time: 18 seconds Sustained "ah" loudness average: 94 dB Oral reading (passage) loudness average: 71 dB Oral reading loudness range: N/A dB Conversational loudness average: 64 dB Conversational loudness range: 52-82 dB Voice quality: hoarse, strained, and low vocal intensity Stimulability trials: Given SLP modeling and occasional  mod cues, loudness average increased to 82 dB counting task level.  Comments:   Completed audio recording of patients baseline voice without cueing from SLP: Yes  Pt does not report difficulty with swallowing which does not warrant further evaluation.  PATIENT REPORTED OUTCOME MEASURES (PROM): N/A  [facility] SLUMS Examination Orientation  3/3  Numeric Problem Solving  1/3  Memory  4/5  Attention 2/2  Thought Organization 2/3 (14 animals in one minute)  Clock Drawing 2/4  Visuospatial Skills               2/2  Short Story Recall  6/8  Total  22/30     Scoring  High School Education  Less than High School Education   Normal  27-30 25-30  Mild Neurocognitive Disorder 21-26 20-24  Dementia  1-20 1-19     TODAY'S TREATMENT: Evaluation only completed this date with trial of SPEAKOUT! Therapy techniques  DATE: 01/02/23  PATIENT EDUCATION: Education details: Plan for SLP treatment 2x/week for 4 weeks and then a break for 4 weeks with reassessment Person educated: Patient Education method: Explanation Education comprehension: verbalized understanding  HOME EXERCISE PROGRAM: Pt will completed HEP as assigned to facilitate carryover of treatment strategies and techniques in home and community environment with written cues.  GOALS: Goals reviewed with patient? Yes  SHORT TERM GOALS: Target date: 03/13/2023  Pt will coordinate vocal and articulatory subsystems in hierarchical speech tasks by producing sounds with intention with min assistance. Baseline: introduced this date, min/mod Goal status: INITIAL  2.  Generalize intentional speech to cognitive-linguistic exercises and conversational speech with improved vocal quality, loudness, articulatory precision, and endurance while maintaining a minimum of 85 dB with min assistance. Baseline: 64 dB Goal  status: INITIAL  3.  Read phrases, sentences, and paragraphs with intention, yielding improved vocal quality, loudness, articulatory precision, and endurance while maintaining a minimum of 85 dB with min assistance. Baseline: 71 dB Goal status: INITIAL  LONG TERM GOALS: Target date: 03/2023  The patient will increase intensity in conversational speech allowing them to converse in the community, increase voice use, slow progression of vocal deterioration, and improve their QOL with indirect cues prn. Baseline: min/mod cues Goal status: INITIAL  ASSESSMENT:  CLINICAL IMPRESSION: Patient is a 72 y.o. female who was seen today for a cognitive linguistic evaluation in setting of Parkinson's disease. Pt presents with mild cognitive linguistic deficits [facility] 22/30) and mild hypokinetic dysarthria characterized by attention and working memory deficits, hoarse/strained vocal quality with glottal fry and fluctuations in loudness and pitch. Pt reports feeling as if she is "not talking loud enough" and has to make an effort to speak louder. While she sustained /a/ for 18 seconds at a level of 94 dB, her volume in conversation was sub optimal at ~64 dB. Pt counted from 1-10 with average of 68 dB and when cued for increased vocal intensity and intent, this improved to 82 dB. Pt is motivated to improve her speech and showed improvement when cues provided by SLP. Recommend SPEAKOUT! Therapy for 9 sessions and plan for discharge to a group setting to continue and maintain gains. Pt is in agreement with plan of care.   OBJECTIVE IMPAIRMENTS: Objective impairments include executive functioning and dysarthria. These impairments are limiting patient from effectively communicating at home and in community.Factors affecting potential to achieve goals and functional outcome are  N/A . Patient will benefit from skilled SLP services to address above impairments and improve overall function.  REHAB POTENTIAL:  Excellent  PLAN:  SLP FREQUENCY: 2x/week  SLP DURATION: 4 weeks  PLANNED INTERVENTIONS: [phone]- [address], Artic, Phon, Eval Compre, Express, [phone] Treatment of speech (30 or 45 min) , Cueing hierachy, SLP instruction and feedback, Compensatory strategies, and Patient/family education   Thank you,  Havery Moros, CCC-SLP [phone]  Cola Gane, CCC-SLP 01/02/2023, 4:04 PM

## 2023-01-07 ENCOUNTER — Ambulatory Visit (HOSPITAL_COMMUNITY): Payer: 59

## 2023-01-07 ENCOUNTER — Encounter (HOSPITAL_COMMUNITY): Payer: Self-pay

## 2023-01-07 ENCOUNTER — Ambulatory Visit (HOSPITAL_COMMUNITY): Payer: 59 | Admitting: Occupational Therapy

## 2023-01-07 DIAGNOSIS — M5459 Other low back pain: Secondary | ICD-10-CM

## 2023-01-07 DIAGNOSIS — G20A1 Parkinson's disease without dyskinesia, without mention of fluctuations: Secondary | ICD-10-CM | POA: Diagnosis not present

## 2023-01-07 DIAGNOSIS — R278 Other lack of coordination: Secondary | ICD-10-CM | POA: Diagnosis not present

## 2023-01-07 DIAGNOSIS — R2681 Unsteadiness on feet: Secondary | ICD-10-CM | POA: Diagnosis not present

## 2023-01-07 DIAGNOSIS — R29818 Other symptoms and signs involving the nervous system: Secondary | ICD-10-CM | POA: Diagnosis not present

## 2023-01-07 DIAGNOSIS — M6281 Muscle weakness (generalized): Secondary | ICD-10-CM | POA: Diagnosis not present

## 2023-01-07 DIAGNOSIS — R471 Dysarthria and anarthria: Secondary | ICD-10-CM | POA: Diagnosis not present

## 2023-01-07 NOTE — Therapy (Signed)
OUTPATIENT PHYSICAL THERAPY TREATMENT   Patient Name: Kimberly Mccormick MRN: [id number] DOB:12-08-1950, 71 y.o., female Today's Date: 01/07/2023  END OF SESSION:  PT End of Session - 01/07/23 1440     Visit Number 2    Date for PT Re-Evaluation 02/11/23    Authorization Type [employer] Dual Complete    Authorization Time Period no auth; no limit    Progress Note Due on Visit 10    PT Start Time 1430    PT Stop Time 1510    PT Time Calculation (min) 40 min    Activity Tolerance Patient tolerated treatment well    Behavior During Therapy WFL for tasks assessed/performed            Past Medical History:  Diagnosis Date   Aneurysm of left internal carotid artery    Small supraclinoid 5 mm   Breast cancer (HCC)    Left s/p lumpectomy (05/06/2018) and adjuvant chemotherapy (06/23/2018) with Adriamycin and Cytoxan x4 followed by Taxol weekly x12   DDD (degenerative disc disease), lumbar    Essential hypertension    Genital warts    GERD (gastroesophageal reflux disease)    History of anemia    History of renal insufficiency    Hypertension    Insomnia    Iron deficiency anemia    Ischemic stroke [facility])    December 2022   Mixed hyperlipidemia    Osteoarthritis    Parkinson's disease (HCC)    Peripheral neuropathy    Related to chemotherapy   Personal history of chemotherapy 2020   Past Surgical History:  Procedure Laterality Date   APPENDECTOMY  1966   BIOPSY  08/29/2020   Procedure: BIOPSY;  Surgeon: Dolores Frame, MD;  [facility];  Service: Gastroenterology;;   BIOPSY  07/24/2022   Procedure: BIOPSY;  Surgeon: Dolores Frame, MD;  [facility];  Service: Gastroenterology;;   BREAST LUMPECTOMY WITH RADIOACTIVE SEED AND SENTINEL LYMPH NODE BIOPSY Left 05/06/2018   Procedure: LEFT BREAST LUMPECTOMY WITH RADIOACTIVE SEED AND LEFT DEEP AXILLARY SENTINEL LYMPH NODE BIOPSY AND BLUE DYE INJECTION;  Surgeon: Claud Kelp, MD;  Location: [facility]  [place];  Service: General;  Laterality: Left;   CATARACT EXTRACTION W/PHACO Left 01/10/2014   Procedure: CATARACT EXTRACTION PHACO AND INTRAOCULAR LENS PLACEMENT ; CDE:  4.94;  Surgeon: Susa Simmonds, MD;  Location: [facility];  Service: Ophthalmology;  Laterality: Left;   CESAREAN SECTION  1986   CHOLECYSTECTOMY     COLONOSCOPY WITH PROPOFOL N/A 07/25/2020   Procedure: COLONOSCOPY WITH PROPOFOL;  Surgeon: Dolores Frame, MD;  [facility];  Service: Gastroenterology;  Laterality: N/A;  10:55   ESOPHAGOGASTRODUODENOSCOPY (EGD) WITH PROPOFOL N/A 08/29/2020   Procedure: ESOPHAGOGASTRODUODENOSCOPY (EGD) WITH PROPOFOL;  Surgeon: Dolores Frame, MD;  [facility];  Service: Gastroenterology;  Laterality: N/A;  12:00   ESOPHAGOGASTRODUODENOSCOPY (EGD) WITH PROPOFOL N/A 07/24/2022   Procedure: ESOPHAGOGASTRODUODENOSCOPY (EGD) WITH PROPOFOL;  Surgeon: Dolores Frame, MD;  [facility];  Service: Gastroenterology;  Laterality: N/A;  12:45;ASA 1-2   HOT HEMOSTASIS  07/24/2022   Procedure: HOT HEMOSTASIS (ARGON PLASMA COAGULATION/BICAP);  Surgeon: Marguerita Merles, Reuel Boom, MD;  [facility];  Service: Gastroenterology;;   IR 3D INDEPENDENT WKST  04/25/2021   IR ANGIO INTRA EXTRACRAN SEL COM CAROTID INNOMINATE UNI R MOD SED  04/25/2021   IR ANGIO INTRA EXTRACRAN SEL INTERNAL CAROTID UNI L MOD SED  04/25/2021   IR  ANGIOGRAM FOLLOW UP STUDY  04/25/2021   IR CT HEAD LTD  04/25/2021   IR NEURO EACH ADD'L AFTER BASIC UNI LEFT (MS)  04/25/2021   IR RADIOLOGIST EVAL & MGMT  03/22/2021   IR RADIOLOGIST EVAL & MGMT  03/28/2021   IR RADIOLOGIST EVAL & MGMT  05/15/2021   IR RADIOLOGIST EVAL & MGMT  09/28/2021   IR TRANSCATH/EMBOLIZ  04/25/2021   IR US GUIDE VASC ACCESS RIGHT  04/25/2021   MYRINGOTOMY WITH TUBE PLACEMENT Right 02/04/2017   Procedure: REVISION OF RIGHT MYRINGOTOMY WITH TUBE PLACEMENT, WITH EXAM OF LEFT EAR;  Surgeon: Newman Pies, MD;  Location:  [facility];  Service: ENT;  Laterality: Right;   NASAL SINUS SURGERY  2016   polypectomy   POLYPECTOMY  07/25/2020   Procedure: POLYPECTOMY;  Surgeon: Dolores Frame, MD;  [facility];  Service: Gastroenterology;;   POLYPECTOMY  08/29/2020   Procedure: POLYPECTOMY;  Surgeon: Dolores Frame, MD;  [facility];  Service: Gastroenterology;;   POLYPECTOMY  07/24/2022   Procedure: POLYPECTOMY;  Surgeon: Dolores Frame, MD;  [facility];  Service: Gastroenterology;;   [facility] REMOVAL N/A 01/06/2019   Procedure: REMOVAL PORT-A-CATH;  Surgeon: Claud Kelp, MD;  Location: [facility];  Service: General;  Laterality: N/A;   PORTACATH PLACEMENT Right 06/16/2018   Procedure: INSERTION PORT-A-CATH;  Surgeon: Claud Kelp, MD;  Location: [facility];  Service: General;  Laterality: Right;   RADIOLOGY WITH ANESTHESIA N/A 04/25/2021   Procedure: IR WITH ANESTHESIA EMBOLIZATION;  Surgeon: Julieanne Cotton, MD;  Location: [facility];  Service: Radiology;  Laterality: N/A;   SCLEROTHERAPY  07/24/2022   Procedure: SCLEROTHERAPY;  Surgeon: Marguerita Merles, Reuel Boom, MD;  [facility];  Service: Gastroenterology;;   SUBMUCOSAL LIFTING INJECTION  08/29/2020   Procedure: SUBMUCOSAL LIFTING INJECTION;  Surgeon: Marguerita Merles, Reuel Boom, MD;  [facility];  Service: Gastroenterology;;   SUBMUCOSAL LIFTING INJECTION  07/24/2022   Procedure: SUBMUCOSAL LIFTING INJECTION;  Surgeon: Dolores Frame, MD;  [facility];  Service: Gastroenterology;;   TONSILLECTOMY  1970   Patient Active Problem List   Diagnosis Date Noted   Acute diarrhea 12/23/2022   Functional dyspepsia 07/01/2022   DOE (dyspnea on exertion) 01/15/2022   Nausea with vomiting 05/28/2021   Duodenal adenoma 05/28/2021   Brain aneurysm 04/25/2021   History of herpes simplex infection 01/08/2021    Papanicolaou smear, as part of routine gynecological examination 01/08/2021   CVA (cerebral vascular accident) (HCC) 01/04/2021   Acute ischemic stroke (HCC) 01/03/2021   Hypokalemia 01/03/2021   Cerebral aneurysm 01/03/2021   Musculoskeletal pain 01/03/2021   Early satiety 11/27/2020   Chronic abdominal pain 07/13/2020   Port-A-Cath in place 06/23/2018   Malignant neoplasm of upper-inner quadrant of left breast in female, estrogen receptor positive (HCC) 04/16/2018   Essential hypertension 03/21/2015   Hyperlipidemia 03/21/2015   Genital herpes 03/21/2015   Fecal urgency 03/21/2015   GERD (gastroesophageal reflux disease) 03/21/2015    PCP: Juliette Alcide MD  REFERRING PROVIDER:   Ihor Austin, NP    REFERRING DIAG:  G20.A1 (ICD-10-CM) - Parkinson's disease without dyskinesia, without mention of fluctuations  M54.50 (ICD-10-CM) - Low back pain, unspecified  G89.29 (ICD-10-CM) - Other chronic pain    Rationale for Evaluation and Treatment: Rehabilitation  THERAPY DIAG:  Muscle weakness (generalized)  Other symptoms and signs involving the nervous system  Parkinson's disease without dyskinesia or fluctuating manifestations (  HCC)  Unsteadiness on feet  Other low back pain  ONSET DATE: "some years ago"  SUBJECTIVE:                                                                                                                                                                                           SUBJECTIVE STATEMENT: Currently reports of low back pain = 5/10.  EVAL: Pt reports that she started noticing issues with back when she was having trouble participating in church standing activities. She reports sharp pain that tires her out when standing and performing various forms of ADLs daily. Pt reports daily pain that is sometimes all days and sometimes intermittent in the day. Pt reports she keeps moving and does not like to sit down. Located just in the low back,  no traveling. Pt enjoys dancing and wants to be able to dance at upcoming birthday party in January.   PERTINENT HISTORY:  Parkinson's Disease Hx of Right Corona Radiata Infarct Cerebral Aneurysm  PAIN:  Are you having pain? Yes: NPRS scale: 7/10 Pain location: Low back Pain description: sharp and lingering occasionally Aggravating factors: Prolonged movement Relieving factors: Laying down and some OTC pain meds  PRECAUTIONS: None  RED FLAGS: None   WEIGHT BEARING RESTRICTIONS: No  FALLS:  Has patient fallen in last 6 months? No  PLOF: Independent  PATIENT GOALS: "I want my legs to get stronger"  NEXT MD VISIT: in 4 months  OBJECTIVE:  Note: Objective measures were completed at Evaluation unless otherwise noted.  DIAGNOSTIC FINDINGS:    PATIENT SURVEYS:  Modified Oswestry 10/50 or 20%   SCREENING FOR RED FLAGS: Bowel or bladder incontinence: No Spinal tumors: No Cauda equina syndrome: No Compression fracture: No Abdominal aneurysm: No  COGNITION: Overall cognitive status: Within functional limits for tasks assessed    SENSATION: WFL   POSTURE: rounded shoulders, forward head, decreased lumbar lordosis, increased thoracic kyphosis, posterior pelvic tilt, and flexed trunk   PALPATION: Non tender to palpate along lumbar paraspinals  LUMBAR ROM:   AROM eval  Flexion WFL  Extension WFL  Right lateral flexion WFL  Left lateral flexion WFL  Right rotation 25%  Left rotation 25%   (Blank rows = not tested)  LOWER EXTREMITY ROM:     Active  Right eval Left eval  Hip flexion    Hip extension    Hip abduction    Hip adduction    Hip internal rotation    Hip external rotation    Knee flexion    Knee extension    Ankle dorsiflexion    Ankle plantarflexion    Ankle inversion  Ankle eversion     (Blank rows = not tested)  LOWER EXTREMITY MMT:    MMT Right eval Left eval  Hip flexion 3+ 3+  Hip extension 3- 3-  Hip abduction 3+ 3-  Hip  adduction    Hip internal rotation    Hip external rotation    Knee flexion    Knee extension 4+ 4-  Ankle dorsiflexion    Ankle plantarflexion    Ankle inversion    Ankle eversion     (Blank rows = not tested)  LUMBAR SPECIAL TESTS:  Slump test: Negative  FUNCTIONAL TESTS:  30 seconds chair stand test: 10x- average DGI 1. Gait level surface (2) Mild Impairment: Walks 20', uses assistive devices, slower speed, mild gait deviations. 2. Change in gait speed (2) Mild Impairment: Is able to change speed but demonstrates mild gait deviations, or not gait deviations but unable to achieve a significant change in velocity, or uses an assistive device. 3. Gait with horizontal head turns (2) Mild Impairment: Performs head turns smoothly with slight change in gait velocity, i.e., minor disruption to smooth gait path or uses walking aid. 4. Gait with vertical head turns (2) Mild Impairment: Performs head turns smoothly with slight change in gait velocity, i.e., minor disruption to smooth gait path or uses walking aid. 5. Gait and pivot turn (3) Normal: Pivot turns safely within 3 seconds and stops quickly with no loss of balance. 6. Step over obstacle (2) Mild Impairment: Is able to step over box, but must slow down and adjust steps to clear box safely. 7. Step around obstacles (2) Mild Impairment: Is able to step around both cones, but must slow down and adjust steps to clear cones. 8. Stairs (2) Mild Impairment: Alternating feet, must use rail.  TOTAL SCORE: 17 / 24    GAIT: Distance walked: >50 ft Assistive device utilized: None Level of assistance: Complete Independence Comments: slow, guarded gait pattern with shuffling pattern, minor lateral sways during ambulation with independence to correct.   TODAY'S TREATMENT:                                                                                                                              DATE:  01/07/23 NuStep, seat 8, level  1, 5' Seated hamstring stretch x 30" x 3 Hip vectors, YTB x 10 x 2 on each Scapular retraction with ER, YTB x 10 x 2 Shoulder ext, YTB x 3" x 10 x 2 Pallof press, YTB x 10   12/31/2022 PT Evaluation    PATIENT EDUCATION:  Education details: PT Evaluation, findings, prognosis, frequency, attendance policy, and HEP if given.  Person educated: Patient Education method: Explanation and Demonstration Education comprehension: verbalized understanding  HOME EXERCISE PROGRAM: Access Code: [email or web address] URL: [email or web address] Date: 01/07/2023 Prepared by: Krystal Clark  Exercises - Seated Hamstring Stretch  - 1-2 x daily - 5 x weekly - 3 reps - 30 hold - Standing 3-Way Leg  Reach with Resistance at Ankles and Counter Support  - 1-2 x daily - 5 x weekly - 2 sets - 10 reps - Shoulder External Rotation and Scapular Retraction with Resistance  - 1-2 x daily - 5 x weekly - 2 sets - 10 reps - 3 hold  EVAL: Postural awareness. Official HEP to initiate next session.   ASSESSMENT:  CLINICAL IMPRESSION: Interventions today were geared towards LE strengthening and back ext strengthening. Tolerated all activities without worsening of symptoms. Demonstrated appropriate levels of fatigue. Rest periods provided. Pacing of activities was slow. Provided slight amount of cueing to ensure correct execution of activity with good carry-over. Reported complete relief of pain at the end of the session. To date, skilled PT is required to address the impairments and improve function.  EVAL: Patient is a 72 y.o. female who was seen today for physical therapy evaluation and treatment for  G20.A1 (ICD-10-CM) - Parkinson's disease without dyskinesia, without mention of fluctuations  M54.50 (ICD-10-CM) - Low back pain, unspecified  G89.29 (ICD-10-CM) - Other chronic pain  Pt is demonstrating limitations in functional transfers, mobility, and safety during ambulation with increased fall risks due to  generalized muscle weakness, reduced balance reactions, reduced postural awareness, and mild amounts of LBP. Pt will benefit from skilled Physical Therapy services to address deficits/limitations in order to improve functional and QOL.    OBJECTIVE IMPAIRMENTS: Abnormal gait, decreased balance, decreased cognition, decreased mobility, difficulty walking, decreased strength, and pain.   ACTIVITY LIMITATIONS: carrying, lifting, bending, standing, transfers, and locomotion level  PARTICIPATION LIMITATIONS: community activity and church  PERSONAL FACTORS: Age and 3+ comorbidities: Parkinson's Disease, previous CVA and cerebral aneurysm  are also affecting patient's functional outcome.   REHAB POTENTIAL: Good  CLINICAL DECISION MAKING: Evolving/moderate complexity  EVALUATION COMPLEXITY: Moderate   GOALS: Goals reviewed with patient? No  SHORT TERM GOALS: Target date: 01/21/2023  Pt will be independent with HEP in order to demonstrate participation in Physical Therapy POC.  Baseline: Goal status: INITIAL  2.  Pt will report 4/10 pain with mobility in order to demonstrate improved pain with functional activities.  Baseline:  Goal status: INITIAL  LONG TERM GOALS: Target date: 02/11/2023  Pt will improve DGI by >4 in order to demonstrate improved functional strength to return to desired activities.  Baseline: See objective.  Goal status: INITIAL  2.  Pt will improve BLE MMT by > 1/2 grade in order to demonstrate improved functional functional capacity during ADLs and recreational activities.  Baseline: See objective.  Goal status: INITIAL  3.  Pt will improve Modified Owestry score by 3 points in order to demonstrate improved pain with functional goals and outcomes. Baseline: See objective.  Goal status: INITIAL  4.  Pt will report 1/10 pain with mobility in order to demonstrate reduced pain with overall functional status.  Baseline: See objective.  Goal status:  INITIAL  PLAN:  PT FREQUENCY: 2x/week  PT DURATION: 6 weeks  PLANNED INTERVENTIONS: 97164- PT Re-evaluation, 97110-Therapeutic exercises, 97530- Therapeutic activity, 97112- Neuromuscular re-education, 97535- Self Care, 98119- Manual therapy, [phone]- Gait training, Balance training, and Joint mobilization.  PLAN FOR NEXT SESSION: Continue POC and may progress as tolerated with emphasis on BLE strengthening, balance, stepping reactions, turning.   Tish Frederickson. Zadrian Mccauley, PT, DPT, OCS Board-Certified Clinical Specialist in Orthopedic PT [facility] [place] [phone] office 01/07/2023, 2:41 PM

## 2023-01-09 ENCOUNTER — Encounter (HOSPITAL_COMMUNITY): Payer: Self-pay

## 2023-01-09 ENCOUNTER — Ambulatory Visit (HOSPITAL_COMMUNITY): Payer: 59

## 2023-01-09 DIAGNOSIS — M5459 Other low back pain: Secondary | ICD-10-CM | POA: Diagnosis not present

## 2023-01-09 DIAGNOSIS — R2681 Unsteadiness on feet: Secondary | ICD-10-CM | POA: Diagnosis not present

## 2023-01-09 DIAGNOSIS — M6281 Muscle weakness (generalized): Secondary | ICD-10-CM

## 2023-01-09 DIAGNOSIS — R278 Other lack of coordination: Secondary | ICD-10-CM | POA: Diagnosis not present

## 2023-01-09 DIAGNOSIS — G20A1 Parkinson's disease without dyskinesia, without mention of fluctuations: Secondary | ICD-10-CM

## 2023-01-09 DIAGNOSIS — R29818 Other symptoms and signs involving the nervous system: Secondary | ICD-10-CM | POA: Diagnosis not present

## 2023-01-09 DIAGNOSIS — R471 Dysarthria and anarthria: Secondary | ICD-10-CM | POA: Diagnosis not present

## 2023-01-09 NOTE — Therapy (Signed)
OUTPATIENT PHYSICAL THERAPY TREATMENT   Patient Name: Kimberly Mccormick MRN: [id number] DOB:[birth year], 72 y.o., female Today's Date: 01/09/2023  END OF SESSION:  PT End of Session - 01/09/23 1516     Visit Number 3    Date for PT Re-Evaluation 02/11/23    Authorization Type [employer] Dual Complete    Authorization Time Period no auth; no limit    Progress Note Due on Visit 10    PT Start Time 1434    PT Stop Time 1512    PT Time Calculation (min) 38 min    Activity Tolerance Patient tolerated treatment well    Behavior During Therapy WFL for tasks assessed/performed             Past Medical History:  Diagnosis Date   Aneurysm of left internal carotid artery    Small supraclinoid 5 mm   Breast cancer (HCC)    Left s/p lumpectomy (05/06/2018) and adjuvant chemotherapy (06/23/2018) with Adriamycin and Cytoxan x4 followed by Taxol weekly x12   DDD (degenerative disc disease), lumbar    Essential hypertension    Genital warts    GERD (gastroesophageal reflux disease)    History of anemia    History of renal insufficiency    Hypertension    Insomnia    Iron deficiency anemia    Ischemic stroke [facility])    December 2022   Mixed hyperlipidemia    Osteoarthritis    Parkinson's disease (HCC)    Peripheral neuropathy    Related to chemotherapy   Personal history of chemotherapy 2020   Past Surgical History:  Procedure Laterality Date   APPENDECTOMY  1966   BIOPSY  08/29/2020   Procedure: BIOPSY;  Surgeon: Dolores Frame, MD;  [facility];  Service: Gastroenterology;;   BIOPSY  07/24/2022   Procedure: BIOPSY;  Surgeon: Dolores Frame, MD;  [facility];  Service: Gastroenterology;;   BREAST LUMPECTOMY WITH RADIOACTIVE SEED AND SENTINEL LYMPH NODE BIOPSY Left 05/06/2018   Procedure: LEFT BREAST LUMPECTOMY WITH RADIOACTIVE SEED AND LEFT DEEP AXILLARY SENTINEL LYMPH NODE BIOPSY AND BLUE DYE INJECTION;  Surgeon: Claud Kelp, MD;  Location:  [facility];  Service: General;  Laterality: Left;   CATARACT EXTRACTION W/PHACO Left 01/10/2014   Procedure: CATARACT EXTRACTION PHACO AND INTRAOCULAR LENS PLACEMENT ; CDE:  4.94;  Surgeon: Susa Simmonds, MD;  Location: [facility];  Service: Ophthalmology;  Laterality: Left;   CESAREAN SECTION  1986   CHOLECYSTECTOMY     COLONOSCOPY WITH PROPOFOL N/A 07/25/2020   Procedure: COLONOSCOPY WITH PROPOFOL;  Surgeon: Dolores Frame, MD;  [facility];  Service: Gastroenterology;  Laterality: N/A;  10:55   ESOPHAGOGASTRODUODENOSCOPY (EGD) WITH PROPOFOL N/A 08/29/2020   Procedure: ESOPHAGOGASTRODUODENOSCOPY (EGD) WITH PROPOFOL;  Surgeon: Dolores Frame, MD;  [facility];  Service: Gastroenterology;  Laterality: N/A;  12:00   ESOPHAGOGASTRODUODENOSCOPY (EGD) WITH PROPOFOL N/A 07/24/2022   Procedure: ESOPHAGOGASTRODUODENOSCOPY (EGD) WITH PROPOFOL;  Surgeon: Dolores Frame, MD;  [facility];  Service: Gastroenterology;  Laterality: N/A;  12:45;ASA 1-2   HOT HEMOSTASIS  07/24/2022   Procedure: HOT HEMOSTASIS (ARGON PLASMA COAGULATION/BICAP);  Surgeon: Marguerita Merles, Reuel Boom, MD;  [facility];  Service: Gastroenterology;;   IR 3D INDEPENDENT WKST  04/25/2021   IR ANGIO INTRA EXTRACRAN SEL COM CAROTID INNOMINATE UNI R MOD SED  04/25/2021   IR ANGIO INTRA EXTRACRAN SEL INTERNAL CAROTID UNI L MOD SED  04/25/2021  IR ANGIOGRAM FOLLOW UP STUDY  04/25/2021   IR CT HEAD LTD  04/25/2021   IR NEURO EACH ADD'L AFTER BASIC UNI LEFT (MS)  04/25/2021   IR RADIOLOGIST EVAL & MGMT  03/22/2021   IR RADIOLOGIST EVAL & MGMT  03/28/2021   IR RADIOLOGIST EVAL & MGMT  05/15/2021   IR RADIOLOGIST EVAL & MGMT  09/28/2021   IR TRANSCATH/EMBOLIZ  04/25/2021   IR US GUIDE VASC ACCESS RIGHT  04/25/2021   MYRINGOTOMY WITH TUBE PLACEMENT Right 02/04/2017   Procedure: REVISION OF RIGHT MYRINGOTOMY WITH TUBE PLACEMENT, WITH EXAM OF LEFT EAR;  Surgeon: Newman Pies, MD;   Location: [facility];  Service: ENT;  Laterality: Right;   NASAL SINUS SURGERY  2016   polypectomy   POLYPECTOMY  07/25/2020   Procedure: POLYPECTOMY;  Surgeon: Dolores Frame, MD;  [facility];  Service: Gastroenterology;;   POLYPECTOMY  08/29/2020   Procedure: POLYPECTOMY;  Surgeon: Dolores Frame, MD;  [facility];  Service: Gastroenterology;;   POLYPECTOMY  07/24/2022   Procedure: POLYPECTOMY;  Surgeon: Dolores Frame, MD;  [facility];  Service: Gastroenterology;;   [facility] REMOVAL N/A 01/06/2019   Procedure: REMOVAL PORT-A-CATH;  Surgeon: Claud Kelp, MD;  Location: [facility];  Service: General;  Laterality: N/A;   PORTACATH PLACEMENT Right 06/16/2018   Procedure: INSERTION PORT-A-CATH;  Surgeon: Claud Kelp, MD;  Location: [facility];  Service: General;  Laterality: Right;   RADIOLOGY WITH ANESTHESIA N/A 04/25/2021   Procedure: IR WITH ANESTHESIA EMBOLIZATION;  Surgeon: Julieanne Cotton, MD;  Location: [facility];  Service: Radiology;  Laterality: N/A;   SCLEROTHERAPY  07/24/2022   Procedure: SCLEROTHERAPY;  Surgeon: Marguerita Merles, Reuel Boom, MD;  [facility];  Service: Gastroenterology;;   SUBMUCOSAL LIFTING INJECTION  08/29/2020   Procedure: SUBMUCOSAL LIFTING INJECTION;  Surgeon: Marguerita Merles, Reuel Boom, MD;  [facility];  Service: Gastroenterology;;   SUBMUCOSAL LIFTING INJECTION  07/24/2022   Procedure: SUBMUCOSAL LIFTING INJECTION;  Surgeon: Dolores Frame, MD;  [facility];  Service: Gastroenterology;;   TONSILLECTOMY  1970   Patient Active Problem List   Diagnosis Date Noted   Acute diarrhea 12/23/2022   Functional dyspepsia 07/01/2022   DOE (dyspnea on exertion) 01/15/2022   Nausea with vomiting 05/28/2021   Duodenal adenoma 05/28/2021   Brain aneurysm 04/25/2021   History of herpes simplex infection 01/08/2021    Papanicolaou smear, as part of routine gynecological examination 01/08/2021   CVA (cerebral vascular accident) (HCC) 01/04/2021   Acute ischemic stroke (HCC) 01/03/2021   Hypokalemia 01/03/2021   Cerebral aneurysm 01/03/2021   Musculoskeletal pain 01/03/2021   Early satiety 11/27/2020   Chronic abdominal pain 07/13/2020   Port-A-Cath in place 06/23/2018   Malignant neoplasm of upper-inner quadrant of left breast in female, estrogen receptor positive (HCC) 04/16/2018   Essential hypertension 03/21/2015   Hyperlipidemia 03/21/2015   Genital herpes 03/21/2015   Fecal urgency 03/21/2015   GERD (gastroesophageal reflux disease) 03/21/2015    PCP: Juliette Alcide MD  REFERRING PROVIDER:   Ihor Austin, NP    REFERRING DIAG:  G20.A1 (ICD-10-CM) - Parkinson's disease without dyskinesia, without mention of fluctuations  M54.50 (ICD-10-CM) - Low back pain, unspecified  G89.29 (ICD-10-CM) - Other chronic pain    Rationale for Evaluation and Treatment: Rehabilitation  THERAPY DIAG:  Muscle weakness (generalized)  Other symptoms and signs involving the nervous system  Parkinson's disease without dyskinesia or fluctuating  manifestations (HCC)  Unsteadiness on feet  Other low back pain  ONSET DATE: "some years ago"  SUBJECTIVE:                                                                                                                                                                                           SUBJECTIVE STATEMENT: Reports she is feeling good today, no reports of pain today.  Has been compliance with HEP daily.  EVAL: Pt reports that she started noticing issues with back when she was having trouble participating in church standing activities. She reports sharp pain that tires her out when standing and performing various forms of ADLs daily. Pt reports daily pain that is sometimes all days and sometimes intermittent in the day. Pt reports she keeps moving and  does not like to sit down. Located just in the low back, no traveling. Pt enjoys dancing and wants to be able to dance at upcoming birthday party in January.   PERTINENT HISTORY:  Parkinson's Disease Hx of Right Corona Radiata Infarct Cerebral Aneurysm  PAIN:  Are you having pain? Yes: NPRS scale: 7/10 Pain location: Low back Pain description: sharp and lingering occasionally Aggravating factors: Prolonged movement Relieving factors: Laying down and some OTC pain meds  PRECAUTIONS: None  RED FLAGS: None   WEIGHT BEARING RESTRICTIONS: No  FALLS:  Has patient fallen in last 6 months? No  PLOF: Independent  PATIENT GOALS: "I want my legs to get stronger"  NEXT MD VISIT: in 4 months  OBJECTIVE:  Note: Objective measures were completed at Evaluation unless otherwise noted.  DIAGNOSTIC FINDINGS:    PATIENT SURVEYS:  Modified Oswestry 10/50 or 20%   SCREENING FOR RED FLAGS: Bowel or bladder incontinence: No Spinal tumors: No Cauda equina syndrome: No Compression fracture: No Abdominal aneurysm: No  COGNITION: Overall cognitive status: Within functional limits for tasks assessed    SENSATION: WFL   POSTURE: rounded shoulders, forward head, decreased lumbar lordosis, increased thoracic kyphosis, posterior pelvic tilt, and flexed trunk   PALPATION: Non tender to palpate along lumbar paraspinals  LUMBAR ROM:   AROM eval  Flexion WFL  Extension WFL  Right lateral flexion WFL  Left lateral flexion WFL  Right rotation 25%  Left rotation 25%   (Blank rows = not tested)  LOWER EXTREMITY ROM:     Active  Right eval Left eval  Hip flexion    Hip extension    Hip abduction    Hip adduction    Hip internal rotation    Hip external rotation    Knee flexion    Knee extension    Ankle  dorsiflexion    Ankle plantarflexion    Ankle inversion    Ankle eversion     (Blank rows = not tested)  LOWER EXTREMITY MMT:    MMT Right eval Left eval  Hip flexion  3+ 3+  Hip extension 3- 3-  Hip abduction 3+ 3-  Hip adduction    Hip internal rotation    Hip external rotation    Knee flexion    Knee extension 4+ 4-  Ankle dorsiflexion    Ankle plantarflexion    Ankle inversion    Ankle eversion     (Blank rows = not tested)  LUMBAR SPECIAL TESTS:  Slump test: Negative  FUNCTIONAL TESTS:  30 seconds chair stand test: 10x- average DGI 1. Gait level surface (2) Mild Impairment: Walks 20', uses assistive devices, slower speed, mild gait deviations. 2. Change in gait speed (2) Mild Impairment: Is able to change speed but demonstrates mild gait deviations, or not gait deviations but unable to achieve a significant change in velocity, or uses an assistive device. 3. Gait with horizontal head turns (2) Mild Impairment: Performs head turns smoothly with slight change in gait velocity, i.e., minor disruption to smooth gait path or uses walking aid. 4. Gait with vertical head turns (2) Mild Impairment: Performs head turns smoothly with slight change in gait velocity, i.e., minor disruption to smooth gait path or uses walking aid. 5. Gait and pivot turn (3) Normal: Pivot turns safely within 3 seconds and stops quickly with no loss of balance. 6. Step over obstacle (2) Mild Impairment: Is able to step over box, but must slow down and adjust steps to clear box safely. 7. Step around obstacles (2) Mild Impairment: Is able to step around both cones, but must slow down and adjust steps to clear cones. 8. Stairs (2) Mild Impairment: Alternating feet, must use rail.  TOTAL SCORE: 17 / 24    GAIT: Distance walked: >50 ft Assistive device utilized: None Level of assistance: Complete Independence Comments: slow, guarded gait pattern with shuffling pattern, minor lateral sways during ambulation with independence to correct.   TODAY'S TREATMENT:                                                                                                                               DATE:  01/09/23: Quadruped UE/LE 5x 5"  Standing: PWR Up 10x PWR rock 10x PWR twist RTB shoulder extension 2x 10 RTB rows 2x 10 Vector stance 3x 5" with 1 HHA   Seated Wback 10x   01/07/23 NuStep, seat 8, level 1, 5' Seated hamstring stretch x 30" x 3 Hip vectors, YTB x 10 x 2 on each Scapular retraction with ER, YTB x 10 x 2 Shoulder ext, YTB x 3" x 10 x 2 Pallof press, YTB x 10   12/31/2022 PT Evaluation    PATIENT EDUCATION:  Education details: PT Evaluation, findings, prognosis, frequency, attendance policy, and HEP if given.  Person educated: Patient Education method: Explanation and Demonstration Education comprehension: verbalized understanding  HOME EXERCISE PROGRAM: Access Code: [email or web address] URL: [email or web address] Date: 01/07/2023 Prepared by: Krystal Clark  Exercises - Seated Hamstring Stretch  - 1-2 x daily - 5 x weekly - 3 reps - 30 hold - Standing 3-Way Leg Reach with Resistance at Ankles and Counter Support  - 1-2 x daily - 5 x weekly - 2 sets - 10 reps - Shoulder External Rotation and Scapular Retraction with Resistance  - 1-2 x daily - 5 x weekly - 2 sets - 10 reps - 3 hold  EVAL: Postural awareness. Official HEP to initiate next session.   ASSESSMENT:  CLINICAL IMPRESSION: Session focus with PWR balance activities, LE strengthening and postural strengthening.  Added PWR activities to improve reaching beyond BOS, core/proximal strengthening and improve rotation with gait with min guard for safety.  Quadruped activities complete for core and proximal strengthening with some difficulty due to weakness and balance.  Pt tolerated well this session.  EVAL: Patient is a 72 y.o. female who was seen today for physical therapy evaluation and treatment for  G20.A1 (ICD-10-CM) - Parkinson's disease without dyskinesia, without mention of fluctuations  M54.50 (ICD-10-CM) - Low back pain, unspecified  G89.29 (ICD-10-CM) - Other  chronic pain  Pt is demonstrating limitations in functional transfers, mobility, and safety during ambulation with increased fall risks due to generalized muscle weakness, reduced balance reactions, reduced postural awareness, and mild amounts of LBP. Pt will benefit from skilled Physical Therapy services to address deficits/limitations in order to improve functional and QOL.    OBJECTIVE IMPAIRMENTS: Abnormal gait, decreased balance, decreased cognition, decreased mobility, difficulty walking, decreased strength, and pain.   ACTIVITY LIMITATIONS: carrying, lifting, bending, standing, transfers, and locomotion level  PARTICIPATION LIMITATIONS: community activity and church  PERSONAL FACTORS: Age and 3+ comorbidities: Parkinson's Disease, previous CVA and cerebral aneurysm  are also affecting patient's functional outcome.   REHAB POTENTIAL: Good  CLINICAL DECISION MAKING: Evolving/moderate complexity  EVALUATION COMPLEXITY: Moderate   GOALS: Goals reviewed with patient? No  SHORT TERM GOALS: Target date: 01/21/2023  Pt will be independent with HEP in order to demonstrate participation in Physical Therapy POC.  Baseline: Goal status: INITIAL  2.  Pt will report 4/10 pain with mobility in order to demonstrate improved pain with functional activities.  Baseline:  Goal status: INITIAL  LONG TERM GOALS: Target date: 02/11/2023  Pt will improve DGI by >4 in order to demonstrate improved functional strength to return to desired activities.  Baseline: See objective.  Goal status: INITIAL  2.  Pt will improve BLE MMT by > 1/2 grade in order to demonstrate improved functional functional capacity during ADLs and recreational activities.  Baseline: See objective.  Goal status: INITIAL  3.  Pt will improve Modified Owestry score by 3 points in order to demonstrate improved pain with functional goals and outcomes. Baseline: See objective.  Goal status: INITIAL  4.  Pt will report 1/10  pain with mobility in order to demonstrate reduced pain with overall functional status.  Baseline: See objective.  Goal status: INITIAL  PLAN:  PT FREQUENCY: 2x/week  PT DURATION: 6 weeks  PLANNED INTERVENTIONS: 97164- PT Re-evaluation, 97110-Therapeutic exercises, 97530- Therapeutic activity, 97112- Neuromuscular re-education, 97535- Self Care, 32440- Manual therapy, [phone]- Gait training, Balance training, and Joint mobilization.  PLAN FOR NEXT SESSION: Continue POC and may progress as tolerated with emphasis on BLE strengthening, balance, stepping reactions, turning.   Tish Frederickson. Adivino, PT, DPT, OCS  Board-Certified Clinical Specialist in Orthopedic PT [facility] [place] [phone] office 01/09/2023, 4:33 PM

## 2023-01-13 ENCOUNTER — Ambulatory Visit (HOSPITAL_COMMUNITY): Payer: 59 | Admitting: Occupational Therapy

## 2023-01-13 ENCOUNTER — Encounter (HOSPITAL_COMMUNITY): Payer: Self-pay | Admitting: Occupational Therapy

## 2023-01-13 ENCOUNTER — Encounter (HOSPITAL_COMMUNITY): Payer: 59 | Admitting: Occupational Therapy

## 2023-01-13 DIAGNOSIS — M6281 Muscle weakness (generalized): Secondary | ICD-10-CM | POA: Diagnosis not present

## 2023-01-13 DIAGNOSIS — R29818 Other symptoms and signs involving the nervous system: Secondary | ICD-10-CM

## 2023-01-13 DIAGNOSIS — G20A1 Parkinson's disease without dyskinesia, without mention of fluctuations: Secondary | ICD-10-CM | POA: Diagnosis not present

## 2023-01-13 DIAGNOSIS — R278 Other lack of coordination: Secondary | ICD-10-CM

## 2023-01-13 DIAGNOSIS — M5459 Other low back pain: Secondary | ICD-10-CM | POA: Diagnosis not present

## 2023-01-13 DIAGNOSIS — R2681 Unsteadiness on feet: Secondary | ICD-10-CM | POA: Diagnosis not present

## 2023-01-13 DIAGNOSIS — R471 Dysarthria and anarthria: Secondary | ICD-10-CM | POA: Diagnosis not present

## 2023-01-13 NOTE — Therapy (Signed)
OUTPATIENT OCCUPATIONAL THERAPY ORTHO EVALUATION  Patient Name: Kimberly Mccormick MRN: [id number] DOB:1951/01/06, 72 y.o., female Today's Date: 01/13/2023  PCP: Quintin Alto, MD REFERRING PROVIDER: Ihor Austin, NP  END OF SESSION: END OF SESSION:  OT End of Session - 01/13/23 1613     Visit Number 2    Number of Visits 8    Date for OT Re-Evaluation 01/31/23    Authorization Type [employer] Dual Complete    OT Start Time 1520    OT Stop Time 1602    OT Time Calculation (min) 42 min    Activity Tolerance Patient tolerated treatment well    Behavior During Therapy WFL for tasks assessed/performed               Past Medical History:  Diagnosis Date   Aneurysm of left internal carotid artery    Small supraclinoid 5 mm   Breast cancer (HCC)    Left s/p lumpectomy (05/06/2018) and adjuvant chemotherapy (06/23/2018) with Adriamycin and Cytoxan x4 followed by Taxol weekly x12   DDD (degenerative disc disease), lumbar    Essential hypertension    Genital warts    GERD (gastroesophageal reflux disease)    History of anemia    History of renal insufficiency    Hypertension    Insomnia    Iron deficiency anemia    Ischemic stroke [facility])    December 2022   Mixed hyperlipidemia    Osteoarthritis    Parkinson's disease (HCC)    Peripheral neuropathy    Related to chemotherapy   Personal history of chemotherapy 2020   Past Surgical History:  Procedure Laterality Date   APPENDECTOMY  1966   BIOPSY  08/29/2020   Procedure: BIOPSY;  Surgeon: Dolores Frame, MD;  [facility]: [facility];  Service: Gastroenterology;;   BIOPSY  07/24/2022   Procedure: BIOPSY;  Surgeon: Dolores Frame, MD;  [facility]: [facility];  Service: Gastroenterology;;   BREAST LUMPECTOMY WITH RADIOACTIVE SEED AND SENTINEL LYMPH NODE BIOPSY Left 05/06/2018   Procedure: LEFT BREAST LUMPECTOMY WITH RADIOACTIVE SEED AND LEFT DEEP AXILLARY SENTINEL LYMPH NODE BIOPSY AND BLUE DYE INJECTION;   Surgeon: Claud Kelp, MD;  Location: [facility];  Service: General;  Laterality: Left;   CATARACT EXTRACTION W/PHACO Left 01/10/2014   Procedure: CATARACT EXTRACTION PHACO AND INTRAOCULAR LENS PLACEMENT ; CDE:  4.94;  Surgeon: Susa Simmonds, MD;  Location: [facility];  Service: Ophthalmology;  Laterality: Left;   CESAREAN SECTION  1986   CHOLECYSTECTOMY     COLONOSCOPY WITH PROPOFOL N/A 07/25/2020   Procedure: COLONOSCOPY WITH PROPOFOL;  Surgeon: Dolores Frame, MD;  [facility]: [facility];  Service: Gastroenterology;  Laterality: N/A;  10:55   ESOPHAGOGASTRODUODENOSCOPY (EGD) WITH PROPOFOL N/A 08/29/2020   Procedure: ESOPHAGOGASTRODUODENOSCOPY (EGD) WITH PROPOFOL;  Surgeon: Dolores Frame, MD;  [facility];  Service: Gastroenterology;  Laterality: N/A;  12:00   ESOPHAGOGASTRODUODENOSCOPY (EGD) WITH PROPOFOL N/A 07/24/2022   Procedure: ESOPHAGOGASTRODUODENOSCOPY (EGD) WITH PROPOFOL;  Surgeon: Dolores Frame, MD;  [facility];  Service: Gastroenterology;  Laterality: N/A;  12:45;ASA 1-2   HOT HEMOSTASIS  07/24/2022   Procedure: HOT HEMOSTASIS (ARGON PLASMA COAGULATION/BICAP);  Surgeon: Marguerita Merles, Reuel Boom, MD;  [facility]: [facility];  Service: Gastroenterology;;   IR 3D INDEPENDENT WKST  04/25/2021   IR ANGIO INTRA EXTRACRAN SEL COM CAROTID INNOMINATE UNI R MOD SED  04/25/2021   IR ANGIO INTRA EXTRACRAN SEL INTERNAL CAROTID UNI L MOD SED  04/25/2021   IR ANGIOGRAM FOLLOW UP STUDY  04/25/2021   IR CT HEAD LTD  04/25/2021   IR NEURO EACH ADD'L AFTER BASIC UNI LEFT (MS)  04/25/2021   IR RADIOLOGIST EVAL & MGMT  03/22/2021   IR RADIOLOGIST EVAL & MGMT  03/28/2021   IR RADIOLOGIST EVAL & MGMT  05/15/2021   IR RADIOLOGIST EVAL & MGMT  09/28/2021   IR TRANSCATH/EMBOLIZ  04/25/2021   IR US GUIDE VASC ACCESS RIGHT  04/25/2021   MYRINGOTOMY WITH TUBE PLACEMENT Right 02/04/2017   Procedure: REVISION OF RIGHT MYRINGOTOMY WITH TUBE PLACEMENT, WITH EXAM  OF LEFT EAR;  Surgeon: Newman Pies, MD;  Location: [facility];  Service: ENT;  Laterality: Right;   NASAL SINUS SURGERY  2016   polypectomy   POLYPECTOMY  07/25/2020   Procedure: POLYPECTOMY;  Surgeon: Dolores Frame, MD;  [facility]: [facility];  Service: Gastroenterology;;   POLYPECTOMY  08/29/2020   Procedure: POLYPECTOMY;  Surgeon: Dolores Frame, MD;  [facility]: [facility];  Service: Gastroenterology;;   POLYPECTOMY  07/24/2022   Procedure: POLYPECTOMY;  Surgeon: Dolores Frame, MD;  [facility];  Service: Gastroenterology;;   [facility] REMOVAL N/A 01/06/2019   Procedure: REMOVAL PORT-A-CATH;  Surgeon: Claud Kelp, MD;  Location: [facility];  Service: General;  Laterality: N/A;   PORTACATH PLACEMENT Right 06/16/2018   Procedure: INSERTION PORT-A-CATH;  Surgeon: Claud Kelp, MD;  Location: [facility];  Service: General;  Laterality: Right;   RADIOLOGY WITH ANESTHESIA N/A 04/25/2021   Procedure: IR WITH ANESTHESIA EMBOLIZATION;  Surgeon: Julieanne Cotton, MD;  Location: [facility];  Service: Radiology;  Laterality: N/A;   SCLEROTHERAPY  07/24/2022   Procedure: SCLEROTHERAPY;  Surgeon: Marguerita Merles, Reuel Boom, MD;  [facility]: [facility];  Service: Gastroenterology;;   SUBMUCOSAL LIFTING INJECTION  08/29/2020   Procedure: SUBMUCOSAL LIFTING INJECTION;  Surgeon: Marguerita Merles, Reuel Boom, MD;  [facility];  Service: Gastroenterology;;   SUBMUCOSAL LIFTING INJECTION  07/24/2022   Procedure: SUBMUCOSAL LIFTING INJECTION;  Surgeon: Dolores Frame, MD;  [facility];  Service: Gastroenterology;;   TONSILLECTOMY  1970   Patient Active Problem List   Diagnosis Date Noted   Acute diarrhea 12/23/2022   Functional dyspepsia 07/01/2022   DOE (dyspnea on exertion) 01/15/2022   Nausea with vomiting 05/28/2021   Duodenal adenoma 05/28/2021   Brain aneurysm 04/25/2021   History of  herpes simplex infection 01/08/2021   Papanicolaou smear, as part of routine gynecological examination 01/08/2021   CVA (cerebral vascular accident) (HCC) 01/04/2021   Acute ischemic stroke (HCC) 01/03/2021   Hypokalemia 01/03/2021   Cerebral aneurysm 01/03/2021   Musculoskeletal pain 01/03/2021   Early satiety 11/27/2020   Chronic abdominal pain 07/13/2020   Port-A-Cath in place 06/23/2018   Malignant neoplasm of upper-inner quadrant of left breast in female, estrogen receptor positive (HCC) 04/16/2018   Essential hypertension 03/21/2015   Hyperlipidemia 03/21/2015   Genital herpes 03/21/2015   Fecal urgency 03/21/2015   GERD (gastroesophageal reflux disease) 03/21/2015    ONSET DATE: ~2 years  REFERRING DIAG: Parkinson's Disease  THERAPY DIAG:  Other lack of coordination  Other symptoms and signs involving the nervous system  Rationale for Evaluation and Treatment: Rehabilitation  SUBJECTIVE:   SUBJECTIVE STATEMENT: "Sometimes I have a hard time moving my leg on the left side" Pt accompanied by: self  PERTINENT HISTORY: 72 y.o. female who is being followed for left-sided predominant Parkinson's disease.  Initially seen  by Dr. Frances Furbish 11/2019 for several year history of intermittent left hand tremors and exam showed evidence of mild left-sided parkinsonism.  DaTscan 02/2020 supportive of diagnosis. Hx of right corona radiata infarct 12/2020 with incidental finding of cerebral aneurysm on workup s/p endovascular treatment with flow diverter device 04/2021.  PRECAUTIONS: Fall  WEIGHT BEARING RESTRICTIONS: No  PAIN:  Are you having pain? No  FALLS: Has patient fallen in last 6 months? No  LIVING ENVIRONMENT: Lives with: lives with their son Lives in: House/apartment Stairs: Yes: External: 3-4 steps; on right going up Has following equipment at home: None  PLOF: Independent  PATIENT GOALS: "I want to get back to feeling more normal"  NEXT MD VISIT: March 19th,  2025  OBJECTIVE:  Note: Objective measures were completed at Evaluation unless otherwise noted.  HAND DOMINANCE: Right  ADLs: Overall ADLs: Pt requires min to mod assist with dressing and grooming due to tremors and weakness/motor planning. Pt also reports that her son has to assist with cooking and cleaning, due to inability to lift most pots and pans, as well as cleaning equipment.   FUNCTIONAL OUTCOME MEASURES: Upper Extremity Functional Scale (UEFS): 39/80 - 48.8%  UPPER EXTREMITY ROM:     BUE WFL BUE able to make full fists  UPPER EXTREMITY MMT:     MMT Right eval Left eval  Shoulder flexion 4+/5 4+/5  Shoulder abduction 4+/5 4/5  Shoulder internal rotation 5/5 5/5  Shoulder external rotation 5/5 5/5  Elbow flexion 5/5 4+/5  Elbow extension 4+/5 4/5  Wrist flexion 5/5 5/5  Wrist extension 5/5 5/5  Wrist ulnar deviation 5/5 5/5  Wrist radial deviation 5/5 5/5  Wrist pronation 5/5 5/5  Wrist supination 5/5 5/5  (Blank rows = not tested)  HAND FUNCTION: Grip strength: Right: 35 lbs; Left: 26 lbs, Lateral pinch: Right: 11 lbs, Left: 8 lbs, and 3 point pinch: Right: 8 lbs, Left: 6 lbs  COORDINATION: 9 Hole Peg test: Right: 22.14 sec; Left: 32.10 sec  SENSATION: WFL  EDEMA: No Swelling noted  OBSERVATIONS: Bilateral tremors noted, especially with increased effort.   TODAY'S TREATMENT:                                                                                                                              DATE:   01/13/23: -A/ROM: flexion, abduction, protraction, horizontal abduction, er/IR, x10 -Shoulder Strengthening: 2#, flexion, abduction, protraction, horizontal abduction, er/IR, x10 -Wrist ROM: flexion, extension, ulnar/radial deviation, supination/pronation, x10 -Wrist Strengthening: 2#, flexion, extension, ulnar/radial deviation, supination/pronation, x10 -grooved peg board - 12 pegs per hand, attempted to hold 4 at a time for in hand translation,  unable to complete   PATIENT EDUCATION: Education details: Shoulder and wrist strengthening Person educated: Patient Education method: Explanation Education comprehension: verbalized understanding  HOME EXERCISE PROGRAM: 12/23: Shoulder and wrist strengthening  GOALS: Goals reviewed with patient? Yes  SHORT TERM GOALS: Target date: 01/31/23  Pt will be provided and  educated on HEP for BUE strength and mobility for ADL completion.   Goal status: IN PROGRESS  2.  Pt will increase independence in mobility and meal preparation by performing a simple meal prep task with independence  Goal status: IN PROGRESS  3.  Pt will increase BUE strength to 5/5 in order to lift and carry items during cooking and cleaning tasks, independently.  Goal status: IN PROGRESS  4.  Pt will increase BUE grip strength by 10# and pinch strength by 2# to improve ability to grasp items and use bilateral integration to perform ADLs.   Goal status: IN PROGRESS  5.  Pt will be educated on AE and DME available for use during ADLs to improve safety and independence.   Goal status: IN PROGRESS   ASSESSMENT:  CLINICAL IMPRESSION: This session, pt presenting to therapy with increased fatigue. She required multiple breaks throughout all exercises, especially with weights due to increased tiredness and muscle fatigue. Pt working on in hand manipulation and coordination this session, demonstrating poor in hand manipulation, only able to pick up 1 at a time with difficulty placing the pegs. Verbal and tactile cuing provided for positioning and technique throughout session.   PERFORMANCE DEFICITS: in functional skills including ADLs, IADLs, coordination, ROM, strength, fascial restrictions, muscle spasms, Fine motor control, Gross motor control, balance, body mechanics, endurance, decreased knowledge of use of DME, and UE functional use, cognitive skills including attention, energy/drive, and safety awareness, and  psychosocial skills including coping strategies and environmental adaptation.    PLAN:  OT FREQUENCY: 1-2x/week  OT DURATION: 4 weeks  PLANNED INTERVENTIONS: 97168 OT Re-evaluation, 97535 self care/ADL training, 47829 therapeutic exercise, 97530 therapeutic activity, 97112 neuromuscular re-education, 97140 manual therapy, 97035 ultrasound, 97010 moist heat, 97032 electrical stimulation (manual), passive range of motion, balance training, visual/perceptual remediation/compensation, energy conservation, coping strategies training, patient/family education, and DME and/or AE instructions  RECOMMENDED OTHER SERVICES: PT and Speech  CONSULTED AND AGREED WITH PLAN OF CARE: Patient  PLAN FOR NEXT SESSION: Strengthening, grip and pinch strengthening, endurance based tasks   Aurelia Gras Bing Plume, OTR/L [facility] [phone] Kennyth Arnold, OT 01/13/2023, 4:14 PM

## 2023-01-13 NOTE — Patient Instructions (Signed)
AROM Exercises   1) Wrist Flexion  Start with wrist at edge of table, palm facing up. With wrist hanging slightly off table, curl wrist upward, and back down.      2) Wrist Extension  Start with wrist at edge of table, palm facing down. With wrist slightly off the edge of the table, curl wrist up and back down.      3) Radial Deviations  Start with forearm flat against a table, wrist hanging slightly off the edge, and palm facing the wall. Bending at the wrist only, and keeping palm facing the wall, bend wrist so fist is pointing towards the floor, back up to start position, and up towards the ceiling. Return to start.        4) WRIST PRONATION  Turn your forearm towards palm face down.  Keep your elbow bent and by the side of your  Body.      5) WRIST SUPINATION  Turn your forearm towards palm face up.  Keep your elbow bent and by the side of your  Body.      *Complete exercises ______ times each, _______ times per day*      Strengthening Exercises  1) WRIST EXTENSION CURLS - TABLE  Hold a small free weight, rest your forearm on a table and bend your wrist up and down with your palm face down as shown.      2) WRIST FLEXION CURLS - TABLE  Hold a small free weight, rest your forearm on a table and bend your wrist up and down with your palm face up as shown.     3) FREE WEIGHT RADIAL/ULNAR DEVIATION - TABLE  Hold a small free weight, rest your forearm on a table and bend your wrist up and down with your palm facing towards the side as shown.     4) Pronation  Forearm supported on table with wrist in neutral position. Using a weight, roll wrist so that palm faces downward. Hold for 2 seconds and return to starting position.     5) Supination  Forearm supported on table with wrist in neutral position. Using a weight, roll wrist so that palm is now facing upward. Hold for 2 seconds and return to starting position.      *Complete  exercises using 2 pound weight, 10-15 times each, 2 times per day*

## 2023-01-16 ENCOUNTER — Ambulatory Visit (HOSPITAL_COMMUNITY): Payer: 59

## 2023-01-16 DIAGNOSIS — R29818 Other symptoms and signs involving the nervous system: Secondary | ICD-10-CM

## 2023-01-16 DIAGNOSIS — R2681 Unsteadiness on feet: Secondary | ICD-10-CM | POA: Diagnosis not present

## 2023-01-16 DIAGNOSIS — R278 Other lack of coordination: Secondary | ICD-10-CM | POA: Diagnosis not present

## 2023-01-16 DIAGNOSIS — M6281 Muscle weakness (generalized): Secondary | ICD-10-CM

## 2023-01-16 DIAGNOSIS — R471 Dysarthria and anarthria: Secondary | ICD-10-CM | POA: Diagnosis not present

## 2023-01-16 DIAGNOSIS — M5459 Other low back pain: Secondary | ICD-10-CM

## 2023-01-16 DIAGNOSIS — G20A1 Parkinson's disease without dyskinesia, without mention of fluctuations: Secondary | ICD-10-CM | POA: Diagnosis not present

## 2023-01-16 NOTE — Therapy (Signed)
OUTPATIENT PHYSICAL THERAPY TREATMENT   Patient Name: Kimberly Mccormick MRN: [id number] DOB:Dec 13, 1950, 72 y.o., female Today's Date: 01/16/2023  END OF SESSION:  PT End of Session - 01/16/23 1303     Visit Number 4    Date for PT Re-Evaluation 02/11/23    Authorization Type [employer] Dual Complete    Authorization Time Period no auth; no limit    Progress Note Due on Visit 10    PT Start Time 1300    PT Stop Time 1340    PT Time Calculation (min) 40 min    Equipment Utilized During Treatment Gait belt    Activity Tolerance Patient tolerated treatment well    Behavior During Therapy WFL for tasks assessed/performed              Past Medical History:  Diagnosis Date   Aneurysm of left internal carotid artery    Small supraclinoid 5 mm   Breast cancer (HCC)    Left s/p lumpectomy (05/06/2018) and adjuvant chemotherapy (06/23/2018) with Adriamycin and Cytoxan x4 followed by Taxol weekly x12   DDD (degenerative disc disease), lumbar    Essential hypertension    Genital warts    GERD (gastroesophageal reflux disease)    History of anemia    History of renal insufficiency    Hypertension    Insomnia    Iron deficiency anemia    Ischemic stroke [facility])    December 2022   Mixed hyperlipidemia    Osteoarthritis    Parkinson's disease (HCC)    Peripheral neuropathy    Related to chemotherapy   Personal history of chemotherapy 2020   Past Surgical History:  Procedure Laterality Date   APPENDECTOMY  1966   BIOPSY  08/29/2020   Procedure: BIOPSY;  Surgeon: Dolores Frame, MD;  [facility];  Service: Gastroenterology;;   BIOPSY  07/24/2022   Procedure: BIOPSY;  Surgeon: Dolores Frame, MD;  [facility];  Service: Gastroenterology;;   BREAST LUMPECTOMY WITH RADIOACTIVE SEED AND SENTINEL LYMPH NODE BIOPSY Left 05/06/2018   Procedure: LEFT BREAST LUMPECTOMY WITH RADIOACTIVE SEED AND LEFT DEEP AXILLARY SENTINEL LYMPH NODE BIOPSY AND BLUE DYE  INJECTION;  Surgeon: Claud Kelp, MD;  Location: [facility];  Service: General;  Laterality: Left;   CATARACT EXTRACTION W/PHACO Left 01/10/2014   Procedure: CATARACT EXTRACTION PHACO AND INTRAOCULAR LENS PLACEMENT ; CDE:  4.94;  Surgeon: Susa Simmonds, MD;  Location: [facility];  Service: Ophthalmology;  Laterality: Left;   CESAREAN SECTION  1986   CHOLECYSTECTOMY     COLONOSCOPY WITH PROPOFOL N/A 07/25/2020   Procedure: COLONOSCOPY WITH PROPOFOL;  Surgeon: Dolores Frame, MD;  [facility];  Service: Gastroenterology;  Laterality: N/A;  10:55   ESOPHAGOGASTRODUODENOSCOPY (EGD) WITH PROPOFOL N/A 08/29/2020   Procedure: ESOPHAGOGASTRODUODENOSCOPY (EGD) WITH PROPOFOL;  Surgeon: Dolores Frame, MD;  [facility];  Service: Gastroenterology;  Laterality: N/A;  12:00   ESOPHAGOGASTRODUODENOSCOPY (EGD) WITH PROPOFOL N/A 07/24/2022   Procedure: ESOPHAGOGASTRODUODENOSCOPY (EGD) WITH PROPOFOL;  Surgeon: Dolores Frame, MD;  [facility];  Service: Gastroenterology;  Laterality: N/A;  12:45;ASA 1-2   HOT HEMOSTASIS  07/24/2022   Procedure: HOT HEMOSTASIS (ARGON PLASMA COAGULATION/BICAP);  Surgeon: Marguerita Merles, Reuel Boom, MD;  [facility];  Service: Gastroenterology;;   IR 3D INDEPENDENT WKST  04/25/2021   IR ANGIO INTRA EXTRACRAN SEL COM CAROTID INNOMINATE UNI R MOD SED  04/25/2021   IR ANGIO INTRA EXTRACRAN SEL  INTERNAL CAROTID UNI L MOD SED  04/25/2021   IR ANGIOGRAM FOLLOW UP STUDY  04/25/2021   IR CT HEAD LTD  04/25/2021   IR NEURO EACH ADD'L AFTER BASIC UNI LEFT (MS)  04/25/2021   IR RADIOLOGIST EVAL & MGMT  03/22/2021   IR RADIOLOGIST EVAL & MGMT  03/28/2021   IR RADIOLOGIST EVAL & MGMT  05/15/2021   IR RADIOLOGIST EVAL & MGMT  09/28/2021   IR TRANSCATH/EMBOLIZ  04/25/2021   IR US GUIDE VASC ACCESS RIGHT  04/25/2021   MYRINGOTOMY WITH TUBE PLACEMENT Right 02/04/2017   Procedure: REVISION OF RIGHT MYRINGOTOMY WITH TUBE  PLACEMENT, WITH EXAM OF LEFT EAR;  Surgeon: Newman Pies, MD;  Location: [facility];  Service: ENT;  Laterality: Right;   NASAL SINUS SURGERY  2016   polypectomy   POLYPECTOMY  07/25/2020   Procedure: POLYPECTOMY;  Surgeon: Dolores Frame, MD;  [facility];  Service: Gastroenterology;;   POLYPECTOMY  08/29/2020   Procedure: POLYPECTOMY;  Surgeon: Dolores Frame, MD;  [facility];  Service: Gastroenterology;;   POLYPECTOMY  07/24/2022   Procedure: POLYPECTOMY;  Surgeon: Dolores Frame, MD;  [facility];  Service: Gastroenterology;;   [facility] REMOVAL N/A 01/06/2019   Procedure: REMOVAL PORT-A-CATH;  Surgeon: Claud Kelp, MD;  Location: [facility];  Service: General;  Laterality: N/A;   PORTACATH PLACEMENT Right 06/16/2018   Procedure: INSERTION PORT-A-CATH;  Surgeon: Claud Kelp, MD;  Location: [facility];  Service: General;  Laterality: Right;   RADIOLOGY WITH ANESTHESIA N/A 04/25/2021   Procedure: IR WITH ANESTHESIA EMBOLIZATION;  Surgeon: Julieanne Cotton, MD;  Location: [facility];  Service: Radiology;  Laterality: N/A;   SCLEROTHERAPY  07/24/2022   Procedure: SCLEROTHERAPY;  Surgeon: Marguerita Merles, Reuel Boom, MD;  [facility];  Service: Gastroenterology;;   SUBMUCOSAL LIFTING INJECTION  08/29/2020   Procedure: SUBMUCOSAL LIFTING INJECTION;  Surgeon: Marguerita Merles, Reuel Boom, MD;  [facility];  Service: Gastroenterology;;   SUBMUCOSAL LIFTING INJECTION  07/24/2022   Procedure: SUBMUCOSAL LIFTING INJECTION;  Surgeon: Dolores Frame, MD;  [facility];  Service: Gastroenterology;;   TONSILLECTOMY  1970   Patient Active Problem List   Diagnosis Date Noted   Acute diarrhea 12/23/2022   Functional dyspepsia 07/01/2022   DOE (dyspnea on exertion) 01/15/2022   Nausea with vomiting 05/28/2021   Duodenal adenoma 05/28/2021   Brain aneurysm  04/25/2021   History of herpes simplex infection 01/08/2021   Papanicolaou smear, as part of routine gynecological examination 01/08/2021   CVA (cerebral vascular accident) (HCC) 01/04/2021   Acute ischemic stroke (HCC) 01/03/2021   Hypokalemia 01/03/2021   Cerebral aneurysm 01/03/2021   Musculoskeletal pain 01/03/2021   Early satiety 11/27/2020   Chronic abdominal pain 07/13/2020   Port-A-Cath in place 06/23/2018   Malignant neoplasm of upper-inner quadrant of left breast in female, estrogen receptor positive (HCC) 04/16/2018   Essential hypertension 03/21/2015   Hyperlipidemia 03/21/2015   Genital herpes 03/21/2015   Fecal urgency 03/21/2015   GERD (gastroesophageal reflux disease) 03/21/2015    PCP: Juliette Alcide MD  REFERRING PROVIDER:   Ihor Austin, NP    REFERRING DIAG:  G20.A1 (ICD-10-CM) - Parkinson's disease without dyskinesia, without mention of fluctuations  M54.50 (ICD-10-CM) - Low back pain, unspecified  G89.29 (ICD-10-CM) - Other chronic pain    Rationale for Evaluation and Treatment: Rehabilitation  THERAPY DIAG:  Other symptoms and signs involving the nervous system  Muscle weakness (generalized)  Other low back pain  Unsteadiness on feet  ONSET DATE: "some years ago"  SUBJECTIVE:                                                                                                                                                                                           SUBJECTIVE STATEMENT: Doing well today and denies pain. Reports of having no issues with her HEP.  EVAL: Pt reports that she started noticing issues with back when she was having trouble participating in church standing activities. She reports sharp pain that tires her out when standing and performing various forms of ADLs daily. Pt reports daily pain that is sometimes all days and sometimes intermittent in the day. Pt reports she keeps moving and does not like to sit down. Located  just in the low back, no traveling. Pt enjoys dancing and wants to be able to dance at upcoming birthday party in January.   PERTINENT HISTORY:  Parkinson's Disease Hx of Right Corona Radiata Infarct Cerebral Aneurysm  PAIN:  Are you having pain? Yes: NPRS scale: 7/10 Pain location: Low back Pain description: sharp and lingering occasionally Aggravating factors: Prolonged movement Relieving factors: Laying down and some OTC pain meds  PRECAUTIONS: None  RED FLAGS: None   WEIGHT BEARING RESTRICTIONS: No  FALLS:  Has patient fallen in last 6 months? No  PLOF: Independent  PATIENT GOALS: "I want my legs to get stronger"  NEXT MD VISIT: in 4 months  OBJECTIVE:  Note: Objective measures were completed at Evaluation unless otherwise noted.  DIAGNOSTIC FINDINGS:    PATIENT SURVEYS:  Modified Oswestry 10/50 or 20%   SCREENING FOR RED FLAGS: Bowel or bladder incontinence: No Spinal tumors: No Cauda equina syndrome: No Compression fracture: No Abdominal aneurysm: No  COGNITION: Overall cognitive status: Within functional limits for tasks assessed    SENSATION: WFL   POSTURE: rounded shoulders, forward head, decreased lumbar lordosis, increased thoracic kyphosis, posterior pelvic tilt, and flexed trunk   PALPATION: Non tender to palpate along lumbar paraspinals  LUMBAR ROM:   AROM eval  Flexion WFL  Extension WFL  Right lateral flexion WFL  Left lateral flexion WFL  Right rotation 25%  Left rotation 25%   (Blank rows = not tested)  LOWER EXTREMITY ROM:     Active  Right eval Left eval  Hip flexion    Hip extension    Hip abduction    Hip adduction    Hip internal rotation    Hip external rotation    Knee flexion    Knee extension    Ankle dorsiflexion  Ankle plantarflexion    Ankle inversion    Ankle eversion     (Blank rows = not tested)  LOWER EXTREMITY MMT:    MMT Right eval Left eval  Hip flexion 3+ 3+  Hip extension 3- 3-  Hip  abduction 3+ 3-  Hip adduction    Hip internal rotation    Hip external rotation    Knee flexion    Knee extension 4+ 4-  Ankle dorsiflexion    Ankle plantarflexion    Ankle inversion    Ankle eversion     (Blank rows = not tested)  LUMBAR SPECIAL TESTS:  Slump test: Negative  FUNCTIONAL TESTS:  30 seconds chair stand test: 10x- average DGI 1. Gait level surface (2) Mild Impairment: Walks 20', uses assistive devices, slower speed, mild gait deviations. 2. Change in gait speed (2) Mild Impairment: Is able to change speed but demonstrates mild gait deviations, or not gait deviations but unable to achieve a significant change in velocity, or uses an assistive device. 3. Gait with horizontal head turns (2) Mild Impairment: Performs head turns smoothly with slight change in gait velocity, i.e., minor disruption to smooth gait path or uses walking aid. 4. Gait with vertical head turns (2) Mild Impairment: Performs head turns smoothly with slight change in gait velocity, i.e., minor disruption to smooth gait path or uses walking aid. 5. Gait and pivot turn (3) Normal: Pivot turns safely within 3 seconds and stops quickly with no loss of balance. 6. Step over obstacle (2) Mild Impairment: Is able to step over box, but must slow down and adjust steps to clear box safely. 7. Step around obstacles (2) Mild Impairment: Is able to step around both cones, but must slow down and adjust steps to clear cones. 8. Stairs (2) Mild Impairment: Alternating feet, must use rail.  TOTAL SCORE: 17 / 24    GAIT: Distance walked: >50 ft Assistive device utilized: None Level of assistance: Complete Independence Comments: slow, guarded gait pattern with shuffling pattern, minor lateral sways during ambulation with independence to correct.   TODAY'S TREATMENT:                                                                                                                              DATE:   01/16/23 NuStep, seat 8, level 2, 5' Seated hamstring stretch x 30" x 3 Hip vectors, RTB x 10 x 2 on each Standing abdominal press x 3" x 10 x 2 Doorway pec stretch 60 abd, 30" x 3 3-way horizontal shoulder abd, RTB x 10   01/09/23: Quadruped UE/LE 5x 5"  Standing: PWR Up 10x PWR rock 10x PWR twist RTB shoulder extension 2x 10 RTB rows 2x 10 Vector stance 3x 5" with 1 HHA   Seated Wback 10x   01/07/23 NuStep, seat 8, level 1, 5' Seated hamstring stretch x 30" x 3 Hip vectors, YTB x 10 x 2 on each Scapular retraction with  ER, YTB x 10 x 2 Shoulder ext, YTB x 3" x 10 x 2 Pallof press, YTB x 10   12/31/2022 PT Evaluation    PATIENT EDUCATION:  Education details: PT Evaluation, findings, prognosis, frequency, attendance policy, and HEP if given.  Person educated: Patient Education method: Explanation and Demonstration Education comprehension: verbalized understanding  HOME EXERCISE PROGRAM: Access Code: [email or web address] URL: [email or web address] 01/16/23 - progressed band exercises to RTB - Doorway Pec Stretch at 60 Degrees Abduction with Arm Straight  - 1-2 x daily - 5 x weekly - 3 reps - 30 hold  Date: 01/07/2023 Prepared by: Krystal Clark  Exercises - Seated Hamstring Stretch  - 1-2 x daily - 5 x weekly - 3 reps - 30 hold - Standing 3-Way Leg Reach with Resistance at Ankles and Counter Support  - 1-2 x daily - 5 x weekly - 2 sets - 10 reps - Shoulder External Rotation and Scapular Retraction with Resistance  - 1-2 x daily - 5 x weekly - 2 sets - 10 reps - 3 hold  EVAL: Postural awareness. Official HEP to initiate next session.   ASSESSMENT:  CLINICAL IMPRESSION: Interventions today were geared towards LE strengthening and back ext strengthening. Tolerated all activities without worsening of symptoms. Demonstrated mild levels of fatigue. Rest periods provided. Pacing of activities was slow. Provided slight amount of cueing to ensure correct execution  of activity with good carry-over. Reported complete relief of pain at the end of the session. To date, skilled PT is required to address the impairments and improve function.   EVAL: Patient is a 72 y.o. female who was seen today for physical therapy evaluation and treatment for  G20.A1 (ICD-10-CM) - Parkinson's disease without dyskinesia, without mention of fluctuations  M54.50 (ICD-10-CM) - Low back pain, unspecified  G89.29 (ICD-10-CM) - Other chronic pain  Pt is demonstrating limitations in functional transfers, mobility, and safety during ambulation with increased fall risks due to generalized muscle weakness, reduced balance reactions, reduced postural awareness, and mild amounts of LBP. Pt will benefit from skilled Physical Therapy services to address deficits/limitations in order to improve functional and QOL.    OBJECTIVE IMPAIRMENTS: Abnormal gait, decreased balance, decreased cognition, decreased mobility, difficulty walking, decreased strength, and pain.   ACTIVITY LIMITATIONS: carrying, lifting, bending, standing, transfers, and locomotion level  PARTICIPATION LIMITATIONS: community activity and church  PERSONAL FACTORS: Age and 3+ comorbidities: Parkinson's Disease, previous CVA and cerebral aneurysm  are also affecting patient's functional outcome.   REHAB POTENTIAL: Good  CLINICAL DECISION MAKING: Evolving/moderate complexity  EVALUATION COMPLEXITY: Moderate   GOALS: Goals reviewed with patient? No  SHORT TERM GOALS: Target date: 01/21/2023  Pt will be independent with HEP in order to demonstrate participation in Physical Therapy POC.  Baseline: Goal status: INITIAL  2.  Pt will report 4/10 pain with mobility in order to demonstrate improved pain with functional activities.  Baseline:  Goal status: INITIAL  LONG TERM GOALS: Target date: 02/11/2023  Pt will improve DGI by >4 in order to demonstrate improved functional strength to return to desired activities.   Baseline: See objective.  Goal status: INITIAL  2.  Pt will improve BLE MMT by > 1/2 grade in order to demonstrate improved functional functional capacity during ADLs and recreational activities.  Baseline: See objective.  Goal status: INITIAL  3.  Pt will improve Modified Owestry score by 3 points in order to demonstrate improved pain with functional goals and outcomes. Baseline: See objective.  Goal status: INITIAL  4.  Pt will report 1/10 pain with mobility in order to demonstrate reduced pain with overall functional status.  Baseline: See objective.  Goal status: INITIAL  PLAN:  PT FREQUENCY: 2x/week  PT DURATION: 6 weeks  PLANNED INTERVENTIONS: 97164- PT Re-evaluation, 97110-Therapeutic exercises, 97530- Therapeutic activity, 97112- Neuromuscular re-education, 97535- Self Care, 16109- Manual therapy, [phone]- Gait training, Balance training, and Joint mobilization.  PLAN FOR NEXT SESSION: Continue POC and may progress as tolerated with emphasis on BLE strengthening, postural awareness, balance, stepping reactions, turning.   Tish Frederickson. Geraldine Sandberg, PT, DPT, OCS Board-Certified Clinical Specialist in Orthopedic PT [facility] [place] [phone] office 01/16/2023, 1:04 PM

## 2023-01-21 ENCOUNTER — Ambulatory Visit (HOSPITAL_COMMUNITY): Payer: 59

## 2023-01-21 ENCOUNTER — Encounter (HOSPITAL_COMMUNITY): Payer: Self-pay

## 2023-01-21 ENCOUNTER — Encounter (HOSPITAL_COMMUNITY): Payer: Self-pay | Admitting: Occupational Therapy

## 2023-01-21 ENCOUNTER — Encounter (HOSPITAL_COMMUNITY): Payer: 59 | Admitting: Occupational Therapy

## 2023-01-21 ENCOUNTER — Ambulatory Visit (HOSPITAL_COMMUNITY): Payer: 59 | Admitting: Occupational Therapy

## 2023-01-21 DIAGNOSIS — R29818 Other symptoms and signs involving the nervous system: Secondary | ICD-10-CM | POA: Diagnosis not present

## 2023-01-21 DIAGNOSIS — R471 Dysarthria and anarthria: Secondary | ICD-10-CM | POA: Diagnosis not present

## 2023-01-21 DIAGNOSIS — M5459 Other low back pain: Secondary | ICD-10-CM

## 2023-01-21 DIAGNOSIS — G20A1 Parkinson's disease without dyskinesia, without mention of fluctuations: Secondary | ICD-10-CM | POA: Diagnosis not present

## 2023-01-21 DIAGNOSIS — R278 Other lack of coordination: Secondary | ICD-10-CM

## 2023-01-21 DIAGNOSIS — M6281 Muscle weakness (generalized): Secondary | ICD-10-CM | POA: Diagnosis not present

## 2023-01-21 DIAGNOSIS — R2681 Unsteadiness on feet: Secondary | ICD-10-CM

## 2023-01-21 NOTE — Therapy (Signed)
 OUTPATIENT PHYSICAL THERAPY TREATMENT   Patient Name: Kimberly Mccormick MRN: [id number] DOB:January 09, 1951, 72 y.o., female Today's Date: 01/21/2023  END OF SESSION:  PT End of Session - 01/21/23 1346     Visit Number 5    Number of Visits 12    Date for PT Re-Evaluation 02/11/23    Authorization Type [employer] Dual Complete    Authorization Time Period no auth; no limit    Progress Note Due on Visit 10    PT Start Time 1346    PT Stop Time 1426    PT Time Calculation (min) 40 min    Equipment Utilized During Treatment Gait belt    Activity Tolerance Patient tolerated treatment well    Behavior During Therapy WFL for tasks assessed/performed              Past Medical History:  Diagnosis Date   Aneurysm of left internal carotid artery    Small supraclinoid 5 mm   Breast cancer (HCC)    Left s/p lumpectomy (05/06/2018) and adjuvant chemotherapy (06/23/2018) with Adriamycin and Cytoxan  x4 followed by Taxol weekly x12   DDD (degenerative disc disease), lumbar    Essential hypertension    Genital warts    GERD (gastroesophageal reflux disease)    History of anemia    History of renal insufficiency    Hypertension    Insomnia    Iron deficiency anemia    Ischemic stroke [facility])    December 2022   Mixed hyperlipidemia    Osteoarthritis    Parkinson's disease (HCC)    Peripheral neuropathy    Related to chemotherapy   Personal history of chemotherapy 2020   Past Surgical History:  Procedure Laterality Date   APPENDECTOMY  1966   BIOPSY  08/29/2020   Procedure: BIOPSY;  Surgeon: Eartha Angelia Sieving, MD;  [facility];  Service: Gastroenterology;;   BIOPSY  07/24/2022   Procedure: BIOPSY;  Surgeon: Eartha Angelia Sieving, MD;  [facility]: [facility];  Service: Gastroenterology;;   BREAST LUMPECTOMY WITH RADIOACTIVE SEED AND SENTINEL LYMPH NODE BIOPSY Left 05/06/2018   Procedure: LEFT BREAST LUMPECTOMY WITH RADIOACTIVE SEED AND LEFT DEEP AXILLARY SENTINEL LYMPH NODE  BIOPSY AND BLUE DYE INJECTION;  Surgeon: Gail Favorite, MD;  Location: [facility];  Service: General;  Laterality: Left;   CATARACT EXTRACTION W/PHACO Left 01/10/2014   Procedure: CATARACT EXTRACTION PHACO AND INTRAOCULAR LENS PLACEMENT ; CDE:  4.94;  Surgeon: Dow JULIANNA Burke, MD;  Location: [facility];  Service: Ophthalmology;  Laterality: Left;   CESAREAN SECTION  1986   CHOLECYSTECTOMY     COLONOSCOPY WITH PROPOFOL  N/A 07/25/2020   Procedure: COLONOSCOPY WITH PROPOFOL ;  Surgeon: Eartha Angelia Sieving, MD;  [facility];  Service: Gastroenterology;  Laterality: N/A;  10:55   ESOPHAGOGASTRODUODENOSCOPY (EGD) WITH PROPOFOL  N/A 08/29/2020   Procedure: ESOPHAGOGASTRODUODENOSCOPY (EGD) WITH PROPOFOL ;  Surgeon: Eartha Angelia Sieving, MD;  [facility];  Service: Gastroenterology;  Laterality: N/A;  12:00   ESOPHAGOGASTRODUODENOSCOPY (EGD) WITH PROPOFOL  N/A 07/24/2022   Procedure: ESOPHAGOGASTRODUODENOSCOPY (EGD) WITH PROPOFOL ;  Surgeon: Eartha Angelia Sieving, MD;  [facility];  Service: Gastroenterology;  Laterality: N/A;  12:45;ASA 1-2   HOT HEMOSTASIS  07/24/2022   Procedure: HOT HEMOSTASIS (ARGON PLASMA COAGULATION/BICAP);  Surgeon: Eartha Angelia, Sieving, MD;  [facility];  Service: Gastroenterology;;   IR 3D INDEPENDENT WKST  04/25/2021   IR ANGIO INTRA EXTRACRAN SEL COM CAROTID INNOMINATE UNI R MOD SED  04/25/2021  IR ANGIO INTRA EXTRACRAN SEL INTERNAL CAROTID UNI L MOD SED  04/25/2021   IR ANGIOGRAM FOLLOW UP STUDY  04/25/2021   IR CT HEAD LTD  04/25/2021   IR NEURO EACH ADD'L AFTER BASIC UNI LEFT (MS)  04/25/2021   IR RADIOLOGIST EVAL & MGMT  03/22/2021   IR RADIOLOGIST EVAL & MGMT  03/28/2021   IR RADIOLOGIST EVAL & MGMT  05/15/2021   IR RADIOLOGIST EVAL & MGMT  09/28/2021   IR TRANSCATH/EMBOLIZ  04/25/2021   IR US  GUIDE VASC ACCESS RIGHT  04/25/2021   MYRINGOTOMY WITH TUBE PLACEMENT Right 02/04/2017   Procedure: REVISION OF RIGHT MYRINGOTOMY  WITH TUBE PLACEMENT, WITH EXAM OF LEFT EAR;  Surgeon: Karis Clunes, MD;  Location: [facility];  Service: ENT;  Laterality: Right;   NASAL SINUS SURGERY  2016   polypectomy   POLYPECTOMY  07/25/2020   Procedure: POLYPECTOMY;  Surgeon: Eartha Angelia Sieving, MD;  [facility];  Service: Gastroenterology;;   POLYPECTOMY  08/29/2020   Procedure: POLYPECTOMY;  Surgeon: Eartha Angelia Sieving, MD;  [facility];  Service: Gastroenterology;;   POLYPECTOMY  07/24/2022   Procedure: POLYPECTOMY;  Surgeon: Eartha Angelia Sieving, MD;  [facility];  Service: Gastroenterology;;   [facility] REMOVAL N/A 01/06/2019   Procedure: REMOVAL PORT-A-CATH;  Surgeon: Gail Favorite, MD;  Location: [facility];  Service: General;  Laterality: N/A;   PORTACATH PLACEMENT Right 06/16/2018   Procedure: INSERTION PORT-A-CATH;  Surgeon: Gail Favorite, MD;  Location: [facility];  Service: General;  Laterality: Right;   RADIOLOGY WITH ANESTHESIA N/A 04/25/2021   Procedure: IR WITH ANESTHESIA EMBOLIZATION;  Surgeon: Dolphus Carrion, MD;  Location: [facility];  Service: Radiology;  Laterality: N/A;   SCLEROTHERAPY  07/24/2022   Procedure: SCLEROTHERAPY;  Surgeon: Eartha Angelia, Sieving, MD;  [facility]: [facility];  Service: Gastroenterology;;   SUBMUCOSAL LIFTING INJECTION  08/29/2020   Procedure: SUBMUCOSAL LIFTING INJECTION;  Surgeon: Eartha Angelia, Sieving, MD;  [facility];  Service: Gastroenterology;;   SUBMUCOSAL LIFTING INJECTION  07/24/2022   Procedure: SUBMUCOSAL LIFTING INJECTION;  Surgeon: Eartha Angelia Sieving, MD;  [facility];  Service: Gastroenterology;;   TONSILLECTOMY  1970   Patient Active Problem List   Diagnosis Date Noted   Acute diarrhea 12/23/2022   Functional dyspepsia 07/01/2022   DOE (dyspnea on exertion) 01/15/2022   Nausea with vomiting 05/28/2021   Duodenal adenoma 05/28/2021   Brain  aneurysm 04/25/2021   History of herpes simplex infection 01/08/2021   Papanicolaou smear, as part of routine gynecological examination 01/08/2021   CVA (cerebral vascular accident) (HCC) 01/04/2021   Acute ischemic stroke (HCC) 01/03/2021   Hypokalemia 01/03/2021   Cerebral aneurysm 01/03/2021   Musculoskeletal pain 01/03/2021   Early satiety 11/27/2020   Chronic abdominal pain 07/13/2020   Port-A-Cath in place 06/23/2018   Malignant neoplasm of upper-inner quadrant of left breast in female, estrogen receptor positive (HCC) 04/16/2018   Essential hypertension 03/21/2015   Hyperlipidemia 03/21/2015   Genital herpes 03/21/2015   Fecal urgency 03/21/2015   GERD (gastroesophageal reflux disease) 03/21/2015    PCP: [name] MD  REFERRING PROVIDER:   Whitfield Raisin, NP    REFERRING DIAG:  G20.A1 (ICD-10-CM) - Parkinson's disease without dyskinesia, without mention of fluctuations  M54.50 (ICD-10-CM) - Low back pain, unspecified  G89.29 (ICD-10-CM) - Other chronic pain    Rationale for Evaluation and Treatment: Rehabilitation  THERAPY DIAG:  Other symptoms and signs  involving the nervous system  Muscle weakness (generalized)  Other low back pain  Unsteadiness on feet  ONSET DATE: some years ago  SUBJECTIVE:                                                                                                                                                                                           SUBJECTIVE STATEMENT: Doing well today and denies pain. Reports of having no issues with her HEP.  EVAL: Pt reports that she started noticing issues with back when she was having trouble participating in church standing activities. She reports sharp pain that tires her out when standing and performing various forms of ADLs daily. Pt reports daily pain that is sometimes all days and sometimes intermittent in the day. Pt reports she keeps moving and does not like to sit down.  Located just in the low back, no traveling. Pt enjoys dancing and wants to be able to dance at upcoming birthday party in January.   PERTINENT HISTORY:  Parkinson's Disease Hx of Right Corona Radiata Infarct Cerebral Aneurysm  PAIN:  Are you having pain? Yes: NPRS scale: 7/10 Pain location: Low back Pain description: sharp and lingering occasionally Aggravating factors: Prolonged movement Relieving factors: Laying down and some OTC pain meds  PRECAUTIONS: None  RED FLAGS: None   WEIGHT BEARING RESTRICTIONS: No  FALLS:  Has patient fallen in last 6 months? No  PLOF: Independent  PATIENT GOALS: I want my legs to get stronger  NEXT MD VISIT: in 4 months  OBJECTIVE:  Note: Objective measures were completed at Evaluation unless otherwise noted.  DIAGNOSTIC FINDINGS:    PATIENT SURVEYS:  Modified Oswestry 10/50 or 20%   SCREENING FOR RED FLAGS: Bowel or bladder incontinence: No Spinal tumors: No Cauda equina syndrome: No Compression fracture: No Abdominal aneurysm: No  COGNITION: Overall cognitive status: Within functional limits for tasks assessed    SENSATION: WFL   POSTURE: rounded shoulders, forward head, decreased lumbar lordosis, increased thoracic kyphosis, posterior pelvic tilt, and flexed trunk   PALPATION: Non tender to palpate along lumbar paraspinals  LUMBAR ROM:   AROM eval  Flexion WFL  Extension WFL  Right lateral flexion WFL  Left lateral flexion WFL  Right rotation 25%  Left rotation 25%   (Blank rows = not tested)  LOWER EXTREMITY ROM:     Active  Right eval Left eval  Hip flexion    Hip extension    Hip abduction    Hip adduction    Hip internal rotation    Hip external rotation    Knee flexion    Knee extension  Ankle dorsiflexion    Ankle plantarflexion    Ankle inversion    Ankle eversion     (Blank rows = not tested)  LOWER EXTREMITY MMT:    MMT Right eval Left eval  Hip flexion 3+ 3+  Hip extension 3-  3-  Hip abduction 3+ 3-  Hip adduction    Hip internal rotation    Hip external rotation    Knee flexion    Knee extension 4+ 4-  Ankle dorsiflexion    Ankle plantarflexion    Ankle inversion    Ankle eversion     (Blank rows = not tested)  LUMBAR SPECIAL TESTS:  Slump test: Negative  FUNCTIONAL TESTS:  30 seconds chair stand test: 10x- average DGI 1. Gait level surface (2) Mild Impairment: Walks 20', uses assistive devices, slower speed, mild gait deviations. 2. Change in gait speed (2) Mild Impairment: Is able to change speed but demonstrates mild gait deviations, or not gait deviations but unable to achieve a significant change in velocity, or uses an assistive device. 3. Gait with horizontal head turns (2) Mild Impairment: Performs head turns smoothly with slight change in gait velocity, i.e., minor disruption to smooth gait path or uses walking aid. 4. Gait with vertical head turns (2) Mild Impairment: Performs head turns smoothly with slight change in gait velocity, i.e., minor disruption to smooth gait path or uses walking aid. 5. Gait and pivot turn (3) Normal: Pivot turns safely within 3 seconds and stops quickly with no loss of balance. 6. Step over obstacle (2) Mild Impairment: Is able to step over box, but must slow down and adjust steps to clear box safely. 7. Step around obstacles (2) Mild Impairment: Is able to step around both cones, but must slow down and adjust steps to clear cones. 8. Stairs (2) Mild Impairment: Alternating feet, must use rail.  TOTAL SCORE: 17 / 24    GAIT: Distance walked: >50 ft Assistive device utilized: None Level of assistance: Complete Independence Comments: slow, guarded gait pattern with shuffling pattern, minor lateral sways during ambulation with independence to correct.   TODAY'S TREATMENT:                                                                                                                              DATE:   01/21/23: NuStep, seat 8, level 2, 5' Bodycraft walkout 2Pl 3RT retro and sidestep both directions Wall arch 10x 3 STS holding tidal tank 8-10# 10x keeping level Toe tapping 6in UE with opposite LE 10x each  01/16/23 NuStep, seat 8, level 2, 5' Seated hamstring stretch x 30 x 3 Hip vectors, RTB x 10 x 2 on each Standing abdominal press x 3 x 10 x 2 Doorway pec stretch 60 abd, 30 x 3 3-way horizontal shoulder abd, RTB x 10   01/09/23: Quadruped UE/LE 5x 5  Standing: PWR Up 10x PWR rock 10x PWR twist RTB shoulder extension 2x 10 RTB  rows 2x 10 Vector stance 3x 5 with 1 HHA   Seated Wback 10x   01/07/23 NuStep, seat 8, level 1, 5' Seated hamstring stretch x 30 x 3 Hip vectors, YTB x 10 x 2 on each Scapular retraction with ER, YTB x 10 x 2 Shoulder ext, YTB x 3 x 10 x 2 Pallof press, YTB x 10   12/31/2022 PT Evaluation    PATIENT EDUCATION:  Education details: PT Evaluation, findings, prognosis, frequency, attendance policy, and HEP if given.  Person educated: Patient Education method: Explanation and Demonstration Education comprehension: verbalized understanding  HOME EXERCISE PROGRAM: Access Code: [email or web address] URL: [email or web address] 01/16/23 - progressed band exercises to RTB - Doorway Pec Stretch at 60 Degrees Abduction with Arm Straight  - 1-2 x daily - 5 x weekly - 3 reps - 30 hold  Date: 01/07/2023 Prepared by: Vinie Haver  Exercises - Seated Hamstring Stretch  - 1-2 x daily - 5 x weekly - 3 reps - 30 hold - Standing 3-Way Leg Reach with Resistance at Ankles and Counter Support  - 1-2 x daily - 5 x weekly - 2 sets - 10 reps - Shoulder External Rotation and Scapular Retraction with Resistance  - 1-2 x daily - 5 x weekly - 2 sets - 10 reps - 3 hold  EVAL: Postural awareness. Official HEP to initiate next session.   ASSESSMENT:  CLINICAL IMPRESSION: Session focus with coordination, postural and LE strengthening and balance  training.  Added several new activities that were tolerated well, did require SBA for safety with bodycraft walkout.  Pt limited by fatigue with 2 seated rest breaks required through session.  Cueing for posture awareness and to look up to improve spacial awareness.     EVAL: Patient is a 72 y.o. female who was seen today for physical therapy evaluation and treatment for  G20.A1 (ICD-10-CM) - Parkinson's disease without dyskinesia, without mention of fluctuations  M54.50 (ICD-10-CM) - Low back pain, unspecified  G89.29 (ICD-10-CM) - Other chronic pain  Pt is demonstrating limitations in functional transfers, mobility, and safety during ambulation with increased fall risks due to generalized muscle weakness, reduced balance reactions, reduced postural awareness, and mild amounts of LBP. Pt will benefit from skilled Physical Therapy services to address deficits/limitations in order to improve functional and QOL.    OBJECTIVE IMPAIRMENTS: Abnormal gait, decreased balance, decreased cognition, decreased mobility, difficulty walking, decreased strength, and pain.   ACTIVITY LIMITATIONS: carrying, lifting, bending, standing, transfers, and locomotion level  PARTICIPATION LIMITATIONS: community activity and church  PERSONAL FACTORS: Age and 3+ comorbidities: Parkinson's Disease, previous CVA and cerebral aneurysm  are also affecting patient's functional outcome.   REHAB POTENTIAL: Good  CLINICAL DECISION MAKING: Evolving/moderate complexity  EVALUATION COMPLEXITY: Moderate   GOALS: Goals reviewed with patient? No  SHORT TERM GOALS: Target date: 01/21/2023  Pt will be independent with HEP in order to demonstrate participation in Physical Therapy POC.  Baseline: Goal status: INITIAL  2.  Pt will report 4/10 pain with mobility in order to demonstrate improved pain with functional activities.  Baseline:  Goal status: INITIAL  LONG TERM GOALS: Target date: 02/11/2023  Pt will improve DGI by  >4 in order to demonstrate improved functional strength to return to desired activities.  Baseline: See objective.  Goal status: INITIAL  2.  Pt will improve BLE MMT by > 1/2 grade in order to demonstrate improved functional functional capacity during ADLs and recreational activities.  Baseline: See objective.  Goal status: INITIAL  3.  Pt will improve Modified Owestry score by 3 points in order to demonstrate improved pain with functional goals and outcomes. Baseline: See objective.  Goal status: INITIAL  4.  Pt will report 1/10 pain with mobility in order to demonstrate reduced pain with overall functional status.  Baseline: See objective.  Goal status: INITIAL  PLAN:  PT FREQUENCY: 2x/week  PT DURATION: 6 weeks  PLANNED INTERVENTIONS: 97164- PT Re-evaluation, 97110-Therapeutic exercises, 97530- Therapeutic activity, 97112- Neuromuscular re-education, 97535- Self Care, [id number]- Manual therapy, [id number]- Gait training, Balance training, and Joint mobilization.  PLAN FOR NEXT SESSION: Continue POC and may progress as tolerated with emphasis on BLE strengthening, postural awareness, balance, stepping reactions, turning.   Augustin Mclean, LPTA/CLT; CBIS [phone]  Mclean Augustin Amble, PTA 01/21/2023, 2:43 PM  01/21/2023, 2:43 PM

## 2023-01-21 NOTE — Therapy (Signed)
 OUTPATIENT OCCUPATIONAL THERAPY ORTHO TREATMENT NOTE  Patient Name: Kimberly Mccormick MRN: [id number] DOB:05-05-1950, 72 y.o., female Today's Date: 01/21/2023  PCP: Lari Standing, MD REFERRING PROVIDER: Whitfield Raisin, NP  END OF SESSION: END OF SESSION:  OT End of Session - 01/21/23 1517     Visit Number 3    Number of Visits 8    Date for OT Re-Evaluation 01/31/23    Authorization Type [employer] Dual Complete    OT Start Time 1434    OT Stop Time 1514    OT Time Calculation (min) 40 min    Activity Tolerance Patient tolerated treatment well    Behavior During Therapy WFL for tasks assessed/performed              Past Medical History:  Diagnosis Date   Aneurysm of left internal carotid artery    Small supraclinoid 5 mm   Breast cancer (HCC)    Left s/p lumpectomy (05/06/2018) and adjuvant chemotherapy (06/23/2018) with Adriamycin and Cytoxan  x4 followed by Taxol weekly x12   DDD (degenerative disc disease), lumbar    Essential hypertension    Genital warts    GERD (gastroesophageal reflux disease)    History of anemia    History of renal insufficiency    Hypertension    Insomnia    Iron deficiency anemia    Ischemic stroke [facility])    December 2022   Mixed hyperlipidemia    Osteoarthritis    Parkinson's disease (HCC)    Peripheral neuropathy    Related to chemotherapy   Personal history of chemotherapy 2020   Past Surgical History:  Procedure Laterality Date   APPENDECTOMY  1966   BIOPSY  08/29/2020   Procedure: BIOPSY;  Surgeon: Eartha Angelia Sieving, MD;  [facility];  Service: Gastroenterology;;   BIOPSY  07/24/2022   Procedure: BIOPSY;  Surgeon: Eartha Angelia Sieving, MD;  [facility]: [facility];  Service: Gastroenterology;;   BREAST LUMPECTOMY WITH RADIOACTIVE SEED AND SENTINEL LYMPH NODE BIOPSY Left 05/06/2018   Procedure: LEFT BREAST LUMPECTOMY WITH RADIOACTIVE SEED AND LEFT DEEP AXILLARY SENTINEL LYMPH NODE BIOPSY AND BLUE DYE INJECTION;   Surgeon: Gail Favorite, MD;  Location: [facility];  Service: General;  Laterality: Left;   CATARACT EXTRACTION W/PHACO Left 01/10/2014   Procedure: CATARACT EXTRACTION PHACO AND INTRAOCULAR LENS PLACEMENT ; CDE:  4.94;  Surgeon: Dow JULIANNA Burke, MD;  Location: [facility];  Service: Ophthalmology;  Laterality: Left;   CESAREAN SECTION  1986   CHOLECYSTECTOMY     COLONOSCOPY WITH PROPOFOL  N/A 07/25/2020   Procedure: COLONOSCOPY WITH PROPOFOL ;  Surgeon: Eartha Angelia Sieving, MD;  [facility];  Service: Gastroenterology;  Laterality: N/A;  10:55   ESOPHAGOGASTRODUODENOSCOPY (EGD) WITH PROPOFOL  N/A 08/29/2020   Procedure: ESOPHAGOGASTRODUODENOSCOPY (EGD) WITH PROPOFOL ;  Surgeon: Eartha Angelia Sieving, MD;  [facility];  Service: Gastroenterology;  Laterality: N/A;  12:00   ESOPHAGOGASTRODUODENOSCOPY (EGD) WITH PROPOFOL  N/A 07/24/2022   Procedure: ESOPHAGOGASTRODUODENOSCOPY (EGD) WITH PROPOFOL ;  Surgeon: Eartha Angelia Sieving, MD;  [facility];  Service: Gastroenterology;  Laterality: N/A;  12:45;ASA 1-2   HOT HEMOSTASIS  07/24/2022   Procedure: HOT HEMOSTASIS (ARGON PLASMA COAGULATION/BICAP);  Surgeon: Eartha Angelia, Sieving, MD;  [facility];  Service: Gastroenterology;;   IR 3D INDEPENDENT WKST  04/25/2021   IR ANGIO INTRA EXTRACRAN SEL COM CAROTID INNOMINATE UNI R MOD SED  04/25/2021   IR ANGIO INTRA EXTRACRAN SEL INTERNAL CAROTID UNI L MOD SED  04/25/2021   IR ANGIOGRAM FOLLOW UP STUDY  04/25/2021   IR CT HEAD LTD  04/25/2021   IR NEURO EACH ADD'L AFTER BASIC UNI LEFT (MS)  04/25/2021   IR RADIOLOGIST EVAL & MGMT  03/22/2021   IR RADIOLOGIST EVAL & MGMT  03/28/2021   IR RADIOLOGIST EVAL & MGMT  05/15/2021   IR RADIOLOGIST EVAL & MGMT  09/28/2021   IR TRANSCATH/EMBOLIZ  04/25/2021   IR US  GUIDE VASC ACCESS RIGHT  04/25/2021   MYRINGOTOMY WITH TUBE PLACEMENT Right 02/04/2017   Procedure: REVISION OF RIGHT MYRINGOTOMY WITH TUBE PLACEMENT, WITH EXAM  OF LEFT EAR;  Surgeon: Karis Clunes, MD;  Location: [facility];  Service: ENT;  Laterality: Right;   NASAL SINUS SURGERY  2016   polypectomy   POLYPECTOMY  07/25/2020   Procedure: POLYPECTOMY;  Surgeon: Eartha Angelia Sieving, MD;  [facility];  Service: Gastroenterology;;   POLYPECTOMY  08/29/2020   Procedure: POLYPECTOMY;  Surgeon: Eartha Angelia Sieving, MD;  [facility];  Service: Gastroenterology;;   POLYPECTOMY  07/24/2022   Procedure: POLYPECTOMY;  Surgeon: Eartha Angelia Sieving, MD;  [facility];  Service: Gastroenterology;;   [facility] REMOVAL N/A 01/06/2019   Procedure: REMOVAL PORT-A-CATH;  Surgeon: Gail Favorite, MD;  Location: [facility];  Service: General;  Laterality: N/A;   PORTACATH PLACEMENT Right 06/16/2018   Procedure: INSERTION PORT-A-CATH;  Surgeon: Gail Favorite, MD;  Location: [facility];  Service: General;  Laterality: Right;   RADIOLOGY WITH ANESTHESIA N/A 04/25/2021   Procedure: IR WITH ANESTHESIA EMBOLIZATION;  Surgeon: Dolphus Carrion, MD;  Location: [facility];  Service: Radiology;  Laterality: N/A;   SCLEROTHERAPY  07/24/2022   Procedure: SCLEROTHERAPY;  Surgeon: Eartha Angelia, Sieving, MD;  Location: [facility];  Service: Gastroenterology;;   SUBMUCOSAL LIFTING INJECTION  08/29/2020   Procedure: SUBMUCOSAL LIFTING INJECTION;  Surgeon: Eartha Angelia, Sieving, MD;  [facility];  Service: Gastroenterology;;   SUBMUCOSAL LIFTING INJECTION  07/24/2022   Procedure: SUBMUCOSAL LIFTING INJECTION;  Surgeon: Eartha Angelia Sieving, MD;  [facility]: [facility];  Service: Gastroenterology;;   TONSILLECTOMY  1970   Patient Active Problem List   Diagnosis Date Noted   Acute diarrhea 12/23/2022   Functional dyspepsia 07/01/2022   DOE (dyspnea on exertion) 01/15/2022   Nausea with vomiting 05/28/2021   Duodenal adenoma 05/28/2021   Brain aneurysm 04/25/2021   History of  herpes simplex infection 01/08/2021   Papanicolaou smear, as part of routine gynecological examination 01/08/2021   CVA (cerebral vascular accident) (HCC) 01/04/2021   Acute ischemic stroke (HCC) 01/03/2021   Hypokalemia 01/03/2021   Cerebral aneurysm 01/03/2021   Musculoskeletal pain 01/03/2021   Early satiety 11/27/2020   Chronic abdominal pain 07/13/2020   Port-A-Cath in place 06/23/2018   Malignant neoplasm of upper-inner quadrant of left breast in female, estrogen receptor positive (HCC) 04/16/2018   Essential hypertension 03/21/2015   Hyperlipidemia 03/21/2015   Genital herpes 03/21/2015   Fecal urgency 03/21/2015   GERD (gastroesophageal reflux disease) 03/21/2015    ONSET DATE: ~2 years  REFERRING DIAG: Parkinson's Disease  THERAPY DIAG:  Other symptoms and signs involving the nervous system  Other lack of coordination  Rationale for Evaluation and Treatment: Rehabilitation  SUBJECTIVE:   SUBJECTIVE STATEMENT: I'm glad to be here Pt accompanied by: self  PERTINENT HISTORY: 72 y.o. female who is being followed for left-sided predominant Parkinson's disease.  Initially seen by Dr. Buck 11/2019 for several year history  of intermittent left hand tremors and exam showed evidence of mild left-sided parkinsonism.  DaTscan  02/2020 supportive of diagnosis. Hx of right corona radiata infarct 12/2020 with incidental finding of cerebral aneurysm on workup s/p endovascular treatment with flow diverter device 04/2021.  PRECAUTIONS: Fall  WEIGHT BEARING RESTRICTIONS: No  PAIN:  Are you having pain? No  FALLS: Has patient fallen in last 6 months? No  LIVING ENVIRONMENT: Lives with: lives with their son Lives in: House/apartment Stairs: Yes: External: 3-4 steps; on right going up Has following equipment at home: None  PLOF: Independent  PATIENT GOALS: I want to get back to feeling more normal  NEXT MD VISIT: March 19th, 2025  OBJECTIVE:  Note: Objective measures  were completed at Evaluation unless otherwise noted.  HAND DOMINANCE: Right  ADLs: Overall ADLs: Pt requires min to mod assist with dressing and grooming due to tremors and weakness/motor planning. Pt also reports that her son has to assist with cooking and cleaning, due to inability to lift most pots and pans, as well as cleaning equipment.   FUNCTIONAL OUTCOME MEASURES: Upper Extremity Functional Scale (UEFS): 39/80 - 48.8%  UPPER EXTREMITY ROM:     BUE WFL BUE able to make full fists  UPPER EXTREMITY MMT:     MMT Right eval Left eval  Shoulder flexion 4+/5 4+/5  Shoulder abduction 4+/5 4/5  Shoulder internal rotation 5/5 5/5  Shoulder external rotation 5/5 5/5  Elbow flexion 5/5 4+/5  Elbow extension 4+/5 4/5  Wrist flexion 5/5 5/5  Wrist extension 5/5 5/5  Wrist ulnar deviation 5/5 5/5  Wrist radial deviation 5/5 5/5  Wrist pronation 5/5 5/5  Wrist supination 5/5 5/5  (Blank rows = not tested)  HAND FUNCTION: Grip strength: Right: 35 lbs; Left: 26 lbs, Lateral pinch: Right: 11 lbs, Left: 8 lbs, and 3 point pinch: Right: 8 lbs, Left: 6 lbs  COORDINATION: 9 Hole Peg test: Right: 22.14 sec; Left: 32.10 sec  SENSATION: WFL  EDEMA: No Swelling noted  OBSERVATIONS: Bilateral tremors noted, especially with increased effort.   TODAY'S TREATMENT:                                                                                                                              DATE:   01/21/23 -Shoulder Strengthening: 2#, flexion, abduction, protraction, horizontal abduction, er/IR, x12 -Strengthening: 2#, hammer curl, bicep curl, tricep kick backs, x12 -Wrist Strengthening: 2#, flexion, extension, ulnar/radial deviation, supination/pronation, x12 -Theraputty: red putty, roll into a ball, flatten into a pancake, roll into a log, tripod pinch x10 BUE, lateral pinch x10 BUE, grasping x10 BUE  01/13/23: -A/ROM: flexion, abduction, protraction, horizontal abduction, er/IR,  x10 -Shoulder Strengthening: 2#, flexion, abduction, protraction, horizontal abduction, er/IR, x10 -Wrist ROM: flexion, extension, ulnar/radial deviation, supination/pronation, x10 -Wrist Strengthening: 2#, flexion, extension, ulnar/radial deviation, supination/pronation, x10 -grooved peg board - 12 pegs per hand, attempted to hold 4 at a time for in hand translation, unable to complete  PATIENT EDUCATION: Education details: Continue HEP Person educated: Patient Education method: Explanation Education comprehension: verbalized understanding  HOME EXERCISE PROGRAM: 12/23: Shoulder and wrist strengthening  GOALS: Goals reviewed with patient? Yes  SHORT TERM GOALS: Target date: 01/31/23  Pt will be provided and educated on HEP for BUE strength and mobility for ADL completion.   Goal status: IN PROGRESS  2.  Pt will increase independence in mobility and meal preparation by performing a simple meal prep task with independence  Goal status: IN PROGRESS  3.  Pt will increase BUE strength to 5/5 in order to lift and carry items during cooking and cleaning tasks, independently.  Goal status: IN PROGRESS  4.  Pt will increase BUE grip strength by 10# and pinch strength by 2# to improve ability to grasp items and use bilateral integration to perform ADLs.   Goal status: IN PROGRESS  5.  Pt will be educated on AE and DME available for use during ADLs to improve safety and independence.   Goal status: IN PROGRESS   ASSESSMENT:  CLINICAL IMPRESSION: Pt having continued fatigue this session, falling asleep multiple times through session, reporting that it is due to sinus medications. She required frequent cuing for participation, as well as frequent rest breaks due to muscle fatigue and overall fatigue. Session focusing on improving strength of BUE and completing large smooth movements. Verbal and tactile cuing for positioning, technique, and participation.   PERFORMANCE DEFICITS: in  functional skills including ADLs, IADLs, coordination, ROM, strength, fascial restrictions, muscle spasms, Fine motor control, Gross motor control, balance, body mechanics, endurance, decreased knowledge of use of DME, and UE functional use, cognitive skills including attention, energy/drive, and safety awareness, and psychosocial skills including coping strategies and environmental adaptation.    PLAN:  OT FREQUENCY: 1-2x/week  OT DURATION: 4 weeks  PLANNED INTERVENTIONS: 97168 OT Re-evaluation, 97535 self care/ADL training, 02889 therapeutic exercise, 97530 therapeutic activity, 97112 neuromuscular re-education, 97140 manual therapy, 97035 ultrasound, 97010 moist heat, 97032 electrical stimulation (manual), passive range of motion, balance training, visual/perceptual remediation/compensation, energy conservation, coping strategies training, patient/family education, and DME and/or AE instructions  RECOMMENDED OTHER SERVICES: PT and Speech  CONSULTED AND AGREED WITH PLAN OF CARE: Patient  PLAN FOR NEXT SESSION: Strengthening, grip and pinch strengthening, endurance based tasks   Valentin Nightingale, OTR/L [facility] [phone] Valentin Jillyn Nightingale, OT 01/21/2023, 3:18 PM

## 2023-01-23 ENCOUNTER — Ambulatory Visit (HOSPITAL_COMMUNITY): Payer: 59

## 2023-01-27 ENCOUNTER — Encounter (HOSPITAL_COMMUNITY): Payer: 59

## 2023-01-27 ENCOUNTER — Encounter (HOSPITAL_COMMUNITY): Payer: 59 | Admitting: Occupational Therapy

## 2023-01-30 ENCOUNTER — Encounter (HOSPITAL_COMMUNITY): Payer: 59

## 2023-01-30 ENCOUNTER — Encounter (HOSPITAL_COMMUNITY): Payer: Self-pay | Admitting: Speech Pathology

## 2023-01-30 ENCOUNTER — Ambulatory Visit (HOSPITAL_COMMUNITY): Payer: 59 | Attending: Adult Health | Admitting: Speech Pathology

## 2023-01-30 DIAGNOSIS — S82832A Other fracture of upper and lower end of left fibula, initial encounter for closed fracture: Secondary | ICD-10-CM | POA: Diagnosis not present

## 2023-01-30 DIAGNOSIS — Z8249 Family history of ischemic heart disease and other diseases of the circulatory system: Secondary | ICD-10-CM | POA: Diagnosis not present

## 2023-01-30 DIAGNOSIS — E876 Hypokalemia: Secondary | ICD-10-CM | POA: Diagnosis not present

## 2023-01-30 DIAGNOSIS — Z8269 Family history of other diseases of the musculoskeletal system and connective tissue: Secondary | ICD-10-CM | POA: Diagnosis not present

## 2023-01-30 DIAGNOSIS — R079 Chest pain, unspecified: Secondary | ICD-10-CM | POA: Diagnosis not present

## 2023-01-30 DIAGNOSIS — S82202A Unspecified fracture of shaft of left tibia, initial encounter for closed fracture: Secondary | ICD-10-CM | POA: Diagnosis not present

## 2023-01-30 DIAGNOSIS — S3993XA Unspecified injury of pelvis, initial encounter: Secondary | ICD-10-CM | POA: Diagnosis not present

## 2023-01-30 DIAGNOSIS — K219 Gastro-esophageal reflux disease without esophagitis: Secondary | ICD-10-CM | POA: Diagnosis not present

## 2023-01-30 DIAGNOSIS — Z635 Disruption of family by separation and divorce: Secondary | ICD-10-CM | POA: Diagnosis not present

## 2023-01-30 DIAGNOSIS — M25572 Pain in left ankle and joints of left foot: Secondary | ICD-10-CM | POA: Diagnosis not present

## 2023-01-30 DIAGNOSIS — Z803 Family history of malignant neoplasm of breast: Secondary | ICD-10-CM | POA: Diagnosis not present

## 2023-01-30 DIAGNOSIS — I10 Essential (primary) hypertension: Secondary | ICD-10-CM | POA: Diagnosis not present

## 2023-01-30 DIAGNOSIS — S82302A Unspecified fracture of lower end of left tibia, initial encounter for closed fracture: Secondary | ICD-10-CM | POA: Diagnosis not present

## 2023-01-30 DIAGNOSIS — S82452A Displaced comminuted fracture of shaft of left fibula, initial encounter for closed fracture: Secondary | ICD-10-CM | POA: Diagnosis not present

## 2023-01-30 DIAGNOSIS — G20A1 Parkinson's disease without dyskinesia, without mention of fluctuations: Secondary | ICD-10-CM | POA: Diagnosis not present

## 2023-01-30 DIAGNOSIS — S82392A Other fracture of lower end of left tibia, initial encounter for closed fracture: Secondary | ICD-10-CM | POA: Diagnosis not present

## 2023-01-30 DIAGNOSIS — Z853 Personal history of malignant neoplasm of breast: Secondary | ICD-10-CM | POA: Diagnosis not present

## 2023-01-30 DIAGNOSIS — R471 Dysarthria and anarthria: Secondary | ICD-10-CM | POA: Insufficient documentation

## 2023-01-30 DIAGNOSIS — E785 Hyperlipidemia, unspecified: Secondary | ICD-10-CM | POA: Diagnosis not present

## 2023-01-30 NOTE — Therapy (Signed)
 OUTPATIENT SPEECH LANGUAGE PATHOLOGY PARKINSON'S TREATMENT   Patient Name: Kimberly Mccormick MRN: [id number] DOB:1951/01/09, 73 y.o., female Today's Date: 01/30/2023  PCP: [name], MD REFERRING PROVIDER: Whitfield Raisin, NP  END OF SESSION:  End of Session - 01/30/23 1031     Visit Number 2    Number of Visits 10    Date for SLP Re-Evaluation 03/20/23    Authorization Type [employer] Dual Complete   [facility] eff: 01/21/22 ded: 240 met oop: 8850 coins 20% uhc portal   SLP Start Time 1025    SLP Stop Time  1105    SLP Time Calculation (min) 40 min    Activity Tolerance Patient tolerated treatment well             Past Medical History:  Diagnosis Date   Aneurysm of left internal carotid artery    Small supraclinoid 5 mm   Breast cancer (HCC)    Left s/p lumpectomy (05/06/2018) and adjuvant chemotherapy (06/23/2018) with Adriamycin and Cytoxan  x4 followed by Taxol weekly x12   DDD (degenerative disc disease), lumbar    Essential hypertension    Genital warts    GERD (gastroesophageal reflux disease)    History of anemia    History of renal insufficiency    Hypertension    Insomnia    Iron deficiency anemia    Ischemic stroke [facility])    December 2022   Mixed hyperlipidemia    Osteoarthritis    Parkinson's disease (HCC)    Peripheral neuropathy    Related to chemotherapy   Personal history of chemotherapy 2020   Past Surgical History:  Procedure Laterality Date   APPENDECTOMY  1966   BIOPSY  08/29/2020   Procedure: BIOPSY;  Surgeon: Eartha Angelia Sieving, MD;  [facility];  Service: Gastroenterology;;   BIOPSY  07/24/2022   Procedure: BIOPSY;  Surgeon: Eartha Angelia Sieving, MD;  [facility];  Service: Gastroenterology;;   BREAST LUMPECTOMY WITH RADIOACTIVE SEED AND SENTINEL LYMPH NODE BIOPSY Left 05/06/2018   Procedure: LEFT BREAST LUMPECTOMY WITH RADIOACTIVE SEED AND LEFT DEEP AXILLARY SENTINEL LYMPH NODE BIOPSY AND BLUE DYE  INJECTION;  Surgeon: Gail Favorite, MD;  Location: [facility];  Service: General;  Laterality: Left;   CATARACT EXTRACTION W/PHACO Left 01/10/2014   Procedure: CATARACT EXTRACTION PHACO AND INTRAOCULAR LENS PLACEMENT ; CDE:  4.94;  Surgeon: Dow JULIANNA Burke, MD;  Location: [facility];  Service: Ophthalmology;  Laterality: Left;   CESAREAN SECTION  1986   CHOLECYSTECTOMY     COLONOSCOPY WITH PROPOFOL  N/A 07/25/2020   Procedure: COLONOSCOPY WITH PROPOFOL ;  Surgeon: Eartha Angelia Sieving, MD;  [facility];  Service: Gastroenterology;  Laterality: N/A;  10:55   ESOPHAGOGASTRODUODENOSCOPY (EGD) WITH PROPOFOL  N/A 08/29/2020   Procedure: ESOPHAGOGASTRODUODENOSCOPY (EGD) WITH PROPOFOL ;  Surgeon: Eartha Angelia Sieving, MD;  [facility];  Service: Gastroenterology;  Laterality: N/A;  12:00   ESOPHAGOGASTRODUODENOSCOPY (EGD) WITH PROPOFOL  N/A 07/24/2022   Procedure: ESOPHAGOGASTRODUODENOSCOPY (EGD) WITH PROPOFOL ;  Surgeon: Eartha Angelia Sieving, MD;  [facility];  Service: Gastroenterology;  Laterality: N/A;  12:45;ASA 1-2   HOT HEMOSTASIS  07/24/2022   Procedure: HOT HEMOSTASIS (ARGON PLASMA COAGULATION/BICAP);  Surgeon: Eartha Angelia, Sieving, MD;  [facility];  Service: Gastroenterology;;   IR 3D INDEPENDENT WKST  04/25/2021   IR ANGIO INTRA EXTRACRAN SEL COM CAROTID INNOMINATE UNI R MOD SED  04/25/2021   IR ANGIO INTRA EXTRACRAN SEL INTERNAL CAROTID UNI L MOD  SED  04/25/2021   IR ANGIOGRAM FOLLOW UP STUDY  04/25/2021   IR CT HEAD LTD  04/25/2021   IR NEURO EACH ADD'L AFTER BASIC UNI LEFT (MS)  04/25/2021   IR RADIOLOGIST EVAL & MGMT  03/22/2021   IR RADIOLOGIST EVAL & MGMT  03/28/2021   IR RADIOLOGIST EVAL & MGMT  05/15/2021   IR RADIOLOGIST EVAL & MGMT  09/28/2021   IR TRANSCATH/EMBOLIZ  04/25/2021   IR US  GUIDE VASC ACCESS RIGHT  04/25/2021   MYRINGOTOMY WITH TUBE PLACEMENT Right 02/04/2017   Procedure: REVISION OF RIGHT MYRINGOTOMY WITH TUBE  PLACEMENT, WITH EXAM OF LEFT EAR;  Surgeon: Karis Clunes, MD;  Location: [facility];  Service: ENT;  Laterality: Right;   NASAL SINUS SURGERY  2016   polypectomy   POLYPECTOMY  07/25/2020   Procedure: POLYPECTOMY;  Surgeon: Eartha Angelia Sieving, MD;  [facility];  Service: Gastroenterology;;   POLYPECTOMY  08/29/2020   Procedure: POLYPECTOMY;  Surgeon: Eartha Angelia Sieving, MD;  [facility];  Service: Gastroenterology;;   POLYPECTOMY  07/24/2022   Procedure: POLYPECTOMY;  Surgeon: Eartha Angelia Sieving, MD;  [facility];  Service: Gastroenterology;;   [facility] REMOVAL N/A 01/06/2019   Procedure: REMOVAL PORT-A-CATH;  Surgeon: Gail Favorite, MD;  Location: [facility];  Service: General;  Laterality: N/A;   PORTACATH PLACEMENT Right 06/16/2018   Procedure: INSERTION PORT-A-CATH;  Surgeon: Gail Favorite, MD;  Location: [facility];  Service: General;  Laterality: Right;   RADIOLOGY WITH ANESTHESIA N/A 04/25/2021   Procedure: IR WITH ANESTHESIA EMBOLIZATION;  Surgeon: Dolphus Carrion, MD;  Location: [facility];  Service: Radiology;  Laterality: N/A;   SCLEROTHERAPY  07/24/2022   Procedure: SCLEROTHERAPY;  Surgeon: Eartha Angelia, Sieving, MD;  [facility];  Service: Gastroenterology;;   SUBMUCOSAL LIFTING INJECTION  08/29/2020   Procedure: SUBMUCOSAL LIFTING INJECTION;  Surgeon: Eartha Angelia, Sieving, MD;  [facility];  Service: Gastroenterology;;   ROBLEY LIFTING INJECTION  07/24/2022   Procedure: SUBMUCOSAL LIFTING INJECTION;  Surgeon: Eartha Angelia Sieving, MD;  [facility];  Service: Gastroenterology;;   TONSILLECTOMY  1970   Patient Active Problem List   Diagnosis Date Noted   Acute diarrhea 12/23/2022   Functional dyspepsia 07/01/2022   DOE (dyspnea on exertion) 01/15/2022   Nausea with vomiting 05/28/2021   Duodenal adenoma 05/28/2021   Brain aneurysm  04/25/2021   History of herpes simplex infection 01/08/2021   Papanicolaou smear, as part of routine gynecological examination 01/08/2021   CVA (cerebral vascular accident) (HCC) 01/04/2021   Acute ischemic stroke (HCC) 01/03/2021   Hypokalemia 01/03/2021   Cerebral aneurysm 01/03/2021   Musculoskeletal pain 01/03/2021   Early satiety 11/27/2020   Chronic abdominal pain 07/13/2020   Port-A-Cath in place 06/23/2018   Malignant neoplasm of upper-inner quadrant of left breast in female, estrogen receptor positive (HCC) 04/16/2018   Essential hypertension 03/21/2015   Hyperlipidemia 03/21/2015   Genital herpes 03/21/2015   Fecal urgency 03/21/2015   GERD (gastroesophageal reflux disease) 03/21/2015    ONSET DATE: 11/2019  REFERRING DIAG: G20.A1 (ICD-10-CM) - Parkinson's disease without dyskinesia, without mention of fluctuations  THERAPY DIAG:  Dysarthria  Rationale for Evaluation and Treatment: Rehabilitation  SUBJECTIVE:   SUBJECTIVE STATEMENT: My voice is different when I sing. Pt accompanied by: self  PERTINENT HISTORY: SENNIE BORDEN is a 73 y.o. female who is referred by Harlene Bogaert, NP (neurology) for SLP evaluation and treatment in setting  of left-sided predominant Parkinson's disease.  Initially seen by Dr. Buck 11/2019 for several year history of intermittent left hand tremors and exam showed evidence of mild left-sided parkinsonism.  DaTscan  02/2020 supportive of diagnosis. Hx of right corona radiata infarct 12/2020 with incidental finding of cerebral aneurysm on workup s/p endovascular treatment with flow diverter device 04/2021 by Dr. Dolphus.  PAIN:  Are you having pain? No  FALLS: Has patient fallen in last 6 months?  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  DIAGNOSTIC FINDINGS:  04/15/2022 MRI HEAD FINDINGS IMPRESSION: 1. No acute brain finding. Moderate chronic small-vessel ischemic changes of the cerebral hemispheric white matter. 2. Left internal carotid artery flow diverting stent. No residual visualized flow in the region of the medially directed aneurysm. 3. 4 mm aneurysm projecting laterally from the petrous carotid on the left. No change.  02/23/2020 NUCLEAR MEDICINE BRAIN IMAGING WITH SPECT (DaTscan  ) IMPRESSION: Significant decreased striatal Ioflupane activity most prominent in the posterior striatum. This pattern can be seen in Parkinsonian syndromes.   Of note, DaTSCAN  is not diagnostic of Parkinsonian syndromes, which remains a clinical diagnosis. DaTscan  is an adjuvant test to aid in the clinical diagnosis of Parkinsonian syndromes.  OBJECTIVE VOICE ASSESSMENT: Sustained ah maximum phonation time: 18 seconds Sustained ah loudness average: 94 dB Oral reading (passage) loudness average: 71 dB Oral reading loudness range: N/A dB Conversational loudness average: 64 dB Conversational loudness range: 52-82 dB Voice quality: hoarse, strained, and low vocal intensity Stimulability trials: Given SLP modeling and occasional mod cues, loudness average increased to 82 dB counting task level.  Comments:   Completed audio recording of patients baseline voice without cueing from SLP: Yes  Pt does not report difficulty with swallowing which does not warrant further evaluation.  PATIENT REPORTED OUTCOME MEASURES (PROM): N/A  TODAY'S TREATMENT: Pt has not been seen since her initial evaluation on 01/02/23 (unsure why she was not scheduled sooner). She received her SPEAKOUT! Therapy book, but didn't realize she had SLP today, so she didn't bring it in. SLP reviewed goals for therapy and answered Pt's questions. She reported that  she has difficulty singing now and sang a song in session. Pt exhibits difficulty with pitch change and has glottal fry. SLP facilitated SPEAKOUT! Therapy hierarchical speech tasks and Pt returned demonstrated with variable min to mi/mod cues. She benefited from cues to open mouth for sustained /a/ and glides in order to reduce glottal fry. She did an excellent job of speaking with intent, but does exhibit some straining. SLP also guided Pt through vocal fold unloading tasks (yaya, hello, mmm). Pt encouraged to complete exercises 2x daily and increase water  intake. SLP opened the What is Parkinson's video on her phone so that she could watch at home with her son. Continue plan of care with focus on goals below.  DATE: 01/30/23  PATIENT EDUCATION: Education details: Plan for SLP treatment 2x/week for 4 weeks and then a break for 4 weeks with reassessment Person educated: Patient Education method: Explanation Education comprehension: verbalized understanding  HOME EXERCISE PROGRAM: Pt will completed HEP as assigned to facilitate carryover of treatment strategies and techniques in home and community environment with written cues.  GOALS: Goals reviewed with patient? Yes  SHORT TERM GOALS: Target date: 03/13/2023  Pt will coordinate vocal and articulatory subsystems in hierarchical speech tasks by producing sounds with intention with min assistance. Baseline: introduced this date, min/mod Goal status: ONGOING  2.  Generalize intentional speech to cognitive-linguistic exercises and conversational speech with improved vocal quality, loudness, articulatory precision, and endurance while maintaining a minimum of 85 dB with min assistance. Baseline: 64 dB Goal status: ONGOING  3.  Read phrases, sentences, and paragraphs with intention, yielding improved vocal quality,  loudness, articulatory precision, and endurance while maintaining a minimum of 85 dB with min assistance. Baseline: 71 dB Goal status: ONGOING  LONG TERM GOALS: Target date: 03/2023  The patient will increase intensity in conversational speech allowing them to converse in the community, increase voice use, slow progression of vocal deterioration, and improve their QOL with indirect cues prn. Baseline: min/mod cues Goal status: ONGOING  ASSESSMENT:  CLINICAL IMPRESSION: Patient is a 73 y.o. female who was seen today for a cognitive linguistic evaluation in setting of Parkinson's disease. Pt presents with mild cognitive linguistic deficits [facility] 22/30) and mild hypokinetic dysarthria characterized by attention and working memory deficits, hoarse/strained vocal quality with glottal fry and fluctuations in loudness and pitch. Pt reports feeling as if she is not talking loud enough and has to make an effort to speak louder. While she sustained /a/ for 18 seconds at a level of 94 dB, her volume in conversation was sub optimal at ~64 dB. Pt counted from 1-10 with average of 68 dB and when cued for increased vocal intensity and intent, this improved to 82 dB. Pt is motivated to improve her speech and showed improvement when cues provided by SLP. Recommend SPEAKOUT! Therapy for 9 sessions and plan for discharge to a group setting to continue and maintain gains. Pt is in agreement with plan of care.   OBJECTIVE IMPAIRMENTS: Objective impairments include executive functioning and dysarthria. These impairments are limiting patient from effectively communicating at home and in community.Factors affecting potential to achieve goals and functional outcome are  N/A . Patient will benefit from skilled SLP services to address above impairments and improve overall function.  REHAB POTENTIAL: Excellent  PLAN:  SLP FREQUENCY: 2x/week  SLP DURATION: 4 weeks  PLANNED INTERVENTIONS: [phone] [place], Phon, Eval Compre, Express, [phone] Treatment of speech (30 or 45 min) , Cueing hierachy, SLP instruction and feedback, Compensatory strategies, and Patient/family education   Thank you,  Lamar Candy, CCC-SLP [phone]  Damarie Schoolfield, CCC-SLP 01/30/2023, 10:32 AM

## 2023-02-02 ENCOUNTER — Other Ambulatory Visit: Payer: Self-pay

## 2023-02-02 ENCOUNTER — Emergency Department (HOSPITAL_COMMUNITY): Payer: 59

## 2023-02-02 ENCOUNTER — Encounter (HOSPITAL_COMMUNITY): Payer: Self-pay | Admitting: Emergency Medicine

## 2023-02-02 ENCOUNTER — Inpatient Hospital Stay (HOSPITAL_COMMUNITY): Payer: 59

## 2023-02-02 ENCOUNTER — Encounter (HOSPITAL_COMMUNITY)
Admission: EM | Disposition: A | Discharge status: Home Health | Payer: Self-pay | Source: Home / Self Care | Attending: Orthopedic Surgery

## 2023-02-02 ENCOUNTER — Inpatient Hospital Stay (HOSPITAL_COMMUNITY)
Admission: EM | Admit: 2023-02-02 | Discharge: 2023-02-05 | DRG: 494 | Disposition: A | Discharge status: Home Health | Payer: 59 | Attending: Orthopedic Surgery | Admitting: Orthopedic Surgery

## 2023-02-02 ENCOUNTER — Inpatient Hospital Stay (HOSPITAL_COMMUNITY): Payer: 59 | Admitting: Anesthesiology

## 2023-02-02 DIAGNOSIS — R0689 Other abnormalities of breathing: Secondary | ICD-10-CM | POA: Diagnosis not present

## 2023-02-02 DIAGNOSIS — R6889 Other general symptoms and signs: Secondary | ICD-10-CM | POA: Diagnosis not present

## 2023-02-02 DIAGNOSIS — Z471 Aftercare following joint replacement surgery: Secondary | ICD-10-CM | POA: Diagnosis not present

## 2023-02-02 DIAGNOSIS — Z820 Family history of epilepsy and other diseases of the nervous system: Secondary | ICD-10-CM

## 2023-02-02 DIAGNOSIS — S82302A Unspecified fracture of lower end of left tibia, initial encounter for closed fracture: Secondary | ICD-10-CM

## 2023-02-02 DIAGNOSIS — S82892A Other fracture of left lower leg, initial encounter for closed fracture: Secondary | ICD-10-CM | POA: Diagnosis not present

## 2023-02-02 DIAGNOSIS — S82402A Unspecified fracture of shaft of left fibula, initial encounter for closed fracture: Secondary | ICD-10-CM

## 2023-02-02 DIAGNOSIS — E785 Hyperlipidemia, unspecified: Secondary | ICD-10-CM | POA: Diagnosis present

## 2023-02-02 DIAGNOSIS — W000XXA Fall on same level due to ice and snow, initial encounter: Secondary | ICD-10-CM | POA: Diagnosis present

## 2023-02-02 DIAGNOSIS — Z635 Disruption of family by separation and divorce: Secondary | ICD-10-CM | POA: Diagnosis not present

## 2023-02-02 DIAGNOSIS — Z803 Family history of malignant neoplasm of breast: Secondary | ICD-10-CM

## 2023-02-02 DIAGNOSIS — E876 Hypokalemia: Secondary | ICD-10-CM | POA: Diagnosis present

## 2023-02-02 DIAGNOSIS — J449 Chronic obstructive pulmonary disease, unspecified: Secondary | ICD-10-CM | POA: Diagnosis not present

## 2023-02-02 DIAGNOSIS — M25572 Pain in left ankle and joints of left foot: Secondary | ICD-10-CM | POA: Diagnosis present

## 2023-02-02 DIAGNOSIS — G20A1 Parkinson's disease without dyskinesia, without mention of fluctuations: Secondary | ICD-10-CM | POA: Diagnosis present

## 2023-02-02 DIAGNOSIS — W1781XA Fall down embankment (hill), initial encounter: Secondary | ICD-10-CM | POA: Diagnosis not present

## 2023-02-02 DIAGNOSIS — D132 Benign neoplasm of duodenum: Secondary | ICD-10-CM | POA: Diagnosis not present

## 2023-02-02 DIAGNOSIS — Z743 Need for continuous supervision: Secondary | ICD-10-CM | POA: Diagnosis not present

## 2023-02-02 DIAGNOSIS — Z9889 Other specified postprocedural states: Secondary | ICD-10-CM | POA: Diagnosis not present

## 2023-02-02 DIAGNOSIS — S82392A Other fracture of lower end of left tibia, initial encounter for closed fracture: Secondary | ICD-10-CM | POA: Diagnosis present

## 2023-02-02 DIAGNOSIS — K30 Functional dyspepsia: Secondary | ICD-10-CM | POA: Diagnosis not present

## 2023-02-02 DIAGNOSIS — S82832A Other fracture of upper and lower end of left fibula, initial encounter for closed fracture: Secondary | ICD-10-CM | POA: Diagnosis not present

## 2023-02-02 DIAGNOSIS — Z853 Personal history of malignant neoplasm of breast: Secondary | ICD-10-CM | POA: Diagnosis not present

## 2023-02-02 DIAGNOSIS — S0990XA Unspecified injury of head, initial encounter: Secondary | ICD-10-CM | POA: Diagnosis not present

## 2023-02-02 DIAGNOSIS — K219 Gastro-esophageal reflux disease without esophagitis: Secondary | ICD-10-CM | POA: Diagnosis present

## 2023-02-02 DIAGNOSIS — S8990XA Unspecified injury of unspecified lower leg, initial encounter: Secondary | ICD-10-CM | POA: Diagnosis not present

## 2023-02-02 DIAGNOSIS — Z8042 Family history of malignant neoplasm of prostate: Secondary | ICD-10-CM | POA: Diagnosis not present

## 2023-02-02 DIAGNOSIS — Z8249 Family history of ischemic heart disease and other diseases of the circulatory system: Secondary | ICD-10-CM

## 2023-02-02 DIAGNOSIS — R079 Chest pain, unspecified: Secondary | ICD-10-CM | POA: Diagnosis not present

## 2023-02-02 DIAGNOSIS — Z8269 Family history of other diseases of the musculoskeletal system and connective tissue: Secondary | ICD-10-CM | POA: Diagnosis not present

## 2023-02-02 DIAGNOSIS — S82202A Unspecified fracture of shaft of left tibia, initial encounter for closed fracture: Principal | ICD-10-CM | POA: Diagnosis present

## 2023-02-02 DIAGNOSIS — I10 Essential (primary) hypertension: Secondary | ICD-10-CM

## 2023-02-02 DIAGNOSIS — W19XXXA Unspecified fall, initial encounter: Secondary | ICD-10-CM | POA: Diagnosis not present

## 2023-02-02 DIAGNOSIS — S3993XA Unspecified injury of pelvis, initial encounter: Secondary | ICD-10-CM | POA: Diagnosis not present

## 2023-02-02 DIAGNOSIS — S82452A Displaced comminuted fracture of shaft of left fibula, initial encounter for closed fracture: Secondary | ICD-10-CM | POA: Diagnosis not present

## 2023-02-02 DIAGNOSIS — E079 Disorder of thyroid, unspecified: Secondary | ICD-10-CM | POA: Diagnosis not present

## 2023-02-02 HISTORY — PX: OPEN REDUCTION INTERNAL FIXATION (ORIF) TIBIA/FIBULA FRACTURE: SHX5992

## 2023-02-02 LAB — CBC WITH DIFFERENTIAL/PLATELET
Abs Immature Granulocytes: 0.02 10*3/uL (ref 0.00–0.07)
Basophils Absolute: 0.1 10*3/uL (ref 0.0–0.1)
Basophils Relative: 1 %
Eosinophils Absolute: 0.2 10*3/uL (ref 0.0–0.5)
Eosinophils Relative: 3 %
HCT: 40.1 % (ref 36.0–46.0)
Hemoglobin: 12.5 g/dL (ref 12.0–15.0)
Immature Granulocytes: 0 %
Lymphocytes Relative: 39 %
Lymphs Abs: 2 10*3/uL (ref 0.7–4.0)
MCH: 26.4 pg (ref 26.0–34.0)
MCHC: 31.2 g/dL (ref 30.0–36.0)
MCV: 84.6 fL (ref 80.0–100.0)
Monocytes Absolute: 0.3 10*3/uL (ref 0.1–1.0)
Monocytes Relative: 6 %
Neutro Abs: 2.6 10*3/uL (ref 1.7–7.7)
Neutrophils Relative %: 51 %
Platelets: 238 10*3/uL (ref 150–400)
RBC: 4.74 MIL/uL (ref 3.87–5.11)
RDW: 13 % (ref 11.5–15.5)
WBC: 5.1 10*3/uL (ref 4.0–10.5)
nRBC: 0 % (ref 0.0–0.2)

## 2023-02-02 LAB — COMPREHENSIVE METABOLIC PANEL
ALT: 11 U/L (ref 0–44)
AST: 20 U/L (ref 15–41)
Albumin: 3.3 g/dL — ABNORMAL LOW (ref 3.5–5.0)
Alkaline Phosphatase: 74 U/L (ref 38–126)
Anion gap: 13 (ref 5–15)
BUN: 12 mg/dL (ref 8–23)
CO2: 20 mmol/L — ABNORMAL LOW (ref 22–32)
Calcium: 8.5 mg/dL — ABNORMAL LOW (ref 8.9–10.3)
Chloride: 110 mmol/L (ref 98–111)
Creatinine, Ser: 0.7 mg/dL (ref 0.44–1.00)
GFR, Estimated: >60 mL/min (ref >60)
Glucose, Bld: 75 mg/dL (ref 70–99)
Potassium: 3.6 mmol/L (ref 3.5–5.1)
Sodium: 143 mmol/L (ref 135–145)
Total Bilirubin: 0.7 mg/dL (ref 0.0–1.2)
Total Protein: 6.1 g/dL — ABNORMAL LOW (ref 6.5–8.1)

## 2023-02-02 SURGERY — OPEN REDUCTION INTERNAL FIXATION (ORIF) TIBIA/FIBULA FRACTURE
Anesthesia: General | Site: Leg Lower | Laterality: Left

## 2023-02-02 MED ORDER — LABETALOL HCL 5 MG/ML IV SOLN
INTRAVENOUS | Status: DC | PRN
  Administered 2023-02-02: 5 mg via INTRAVENOUS
  Administered 2023-02-02: 2.5 mg via INTRAVENOUS

## 2023-02-02 MED ORDER — PHENYLEPHRINE HCL-NACL 20-0.9 MG/250ML-% IV SOLN
INTRAVENOUS | Status: DC | PRN
  Administered 2023-02-02: 50 ug/min via INTRAVENOUS

## 2023-02-02 MED ORDER — OXYCODONE HCL 5 MG PO TABS
5.0000 mg | ORAL_TABLET | ORAL | Status: DC | PRN
  Administered 2023-02-03: 10 mg via ORAL
  Administered 2023-02-03 – 2023-02-04 (×2): 5 mg via ORAL
  Administered 2023-02-04: 10 mg via ORAL
  Administered 2023-02-04: 5 mg via ORAL
  Administered 2023-02-04 – 2023-02-05 (×2): 10 mg via ORAL
  Filled 2023-02-02: qty 2
  Filled 2023-02-02: qty 1
  Filled 2023-02-02: qty 2
  Filled 2023-02-02: qty 1
  Filled 2023-02-02 (×3): qty 2

## 2023-02-02 MED ORDER — FENTANYL CITRATE (PF) 250 MCG/5ML IJ SOLN
INTRAMUSCULAR | Status: AC
  Filled 2023-02-02: qty 5

## 2023-02-02 MED ORDER — LACTATED RINGERS IV SOLN: INTRAVENOUS | Status: DC | PRN

## 2023-02-02 MED ORDER — ONDANSETRON HCL 4 MG/2ML IJ SOLN
INTRAMUSCULAR | Status: DC | PRN
  Administered 2023-02-02: 4 mg via INTRAVENOUS

## 2023-02-02 MED ORDER — TRANEXAMIC ACID-NACL 1000-0.7 MG/100ML-% IV SOLN
INTRAVENOUS | Status: AC
  Filled 2023-02-02: qty 100

## 2023-02-02 MED ORDER — HYDRALAZINE HCL 20 MG/ML IJ SOLN
INTRAMUSCULAR | Status: AC
  Filled 2023-02-02: qty 1

## 2023-02-02 MED ORDER — MIDAZOLAM HCL 2 MG/2ML IJ SOLN
INTRAMUSCULAR | Status: AC
Start: 2023-02-02 — End: ?
  Filled 2023-02-02: qty 2

## 2023-02-02 MED ORDER — FENTANYL CITRATE (PF) 100 MCG/2ML IJ SOLN
INTRAMUSCULAR | Status: AC
  Filled 2023-02-02: qty 2

## 2023-02-02 MED ORDER — TRANEXAMIC ACID-NACL 1000-0.7 MG/100ML-% IV SOLN
1000.0000 mg | INTRAVENOUS | Status: AC
  Administered 2023-02-02: 1000 mg via INTRAVENOUS

## 2023-02-02 MED ORDER — ACETAMINOPHEN 10 MG/ML IV SOLN
INTRAVENOUS | Status: AC
  Filled 2023-02-02: qty 100

## 2023-02-02 MED ORDER — 0.9 % SODIUM CHLORIDE (POUR BTL) OPTIME
TOPICAL | Status: DC | PRN
  Administered 2023-02-02: 100 mL

## 2023-02-02 MED ORDER — FENTANYL CITRATE (PF) 250 MCG/5ML IJ SOLN
INTRAMUSCULAR | Status: DC | PRN
  Administered 2023-02-02 (×4): 50 ug via INTRAVENOUS

## 2023-02-02 MED ORDER — LIDOCAINE HCL (CARDIAC) PF 50 MG/5ML IV SOSY
PREFILLED_SYRINGE | INTRAVENOUS | Status: DC | PRN
  Administered 2023-02-02: 60 mg via INTRAVENOUS

## 2023-02-02 MED ORDER — ACETAMINOPHEN 500 MG PO TABS
1000.0000 mg | ORAL_TABLET | Freq: Three times a day (TID) | ORAL | Status: DC
  Administered 2023-02-02 – 2023-02-05 (×8): 1000 mg via ORAL
  Filled 2023-02-02 (×7): qty 2

## 2023-02-02 MED ORDER — ACETAMINOPHEN 10 MG/ML IV SOLN
1000.0000 mg | Freq: Once | INTRAVENOUS | Status: DC | PRN
  Administered 2023-02-03: 1000 mg via INTRAVENOUS

## 2023-02-02 MED ORDER — CEFAZOLIN SODIUM-DEXTROSE 2-4 GM/100ML-% IV SOLN
INTRAVENOUS | Status: AC
  Filled 2023-02-02: qty 100

## 2023-02-02 MED ORDER — AMISULPRIDE (ANTIEMETIC) 5 MG/2ML IV SOLN: 10.0000 mg | Freq: Once | INTRAVENOUS | Status: DC | PRN

## 2023-02-02 MED ORDER — MIDAZOLAM HCL 5 MG/5ML IJ SOLN
INTRAMUSCULAR | Status: DC | PRN
  Administered 2023-02-02: .5 mg via INTRAVENOUS

## 2023-02-02 MED ORDER — DEXAMETHASONE SODIUM PHOSPHATE 4 MG/ML IJ SOLN
INTRAMUSCULAR | Status: DC | PRN
  Administered 2023-02-02: 10 mg via INTRAVENOUS

## 2023-02-02 MED ORDER — FENTANYL CITRATE (PF) 100 MCG/2ML IJ SOLN
25.0000 ug | INTRAMUSCULAR | Status: DC | PRN
  Administered 2023-02-02 (×2): 25 ug via INTRAVENOUS
  Administered 2023-02-03: 50 ug via INTRAVENOUS

## 2023-02-02 MED ORDER — HYDROMORPHONE HCL 1 MG/ML IJ SOLN
0.5000 mg | INTRAMUSCULAR | Status: AC | PRN
  Administered 2023-02-03: 0.5 mg via INTRAVENOUS
  Filled 2023-02-02: qty 0.5

## 2023-02-02 MED ORDER — ROCURONIUM BROMIDE 100 MG/10ML IV SOLN
INTRAVENOUS | Status: DC | PRN
  Administered 2023-02-02: 60 mg via INTRAVENOUS

## 2023-02-02 MED ORDER — FENTANYL CITRATE PF 50 MCG/ML IJ SOSY
75.0000 ug | PREFILLED_SYRINGE | Freq: Once | INTRAMUSCULAR | Status: AC
  Administered 2023-02-02: 75 ug via INTRAVENOUS
  Filled 2023-02-02: qty 2

## 2023-02-02 MED ORDER — ONDANSETRON HCL 4 MG/2ML IJ SOLN: 4.0000 mg | Freq: Once | INTRAMUSCULAR | Status: DC | PRN

## 2023-02-02 MED ORDER — PROPOFOL 10 MG/ML IV BOLUS
INTRAVENOUS | Status: DC | PRN
  Administered 2023-02-02: 100 mg via INTRAVENOUS
  Administered 2023-02-02: 20 mg via INTRAVENOUS

## 2023-02-02 MED ORDER — CEFAZOLIN SODIUM-DEXTROSE 2-4 GM/100ML-% IV SOLN
2.0000 g | INTRAVENOUS | Status: AC
  Administered 2023-02-02: 2 g via INTRAVENOUS

## 2023-02-02 MED ORDER — SUGAMMADEX SODIUM 200 MG/2ML IV SOLN
INTRAVENOUS | Status: DC | PRN
  Administered 2023-02-02: 200 mg via INTRAVENOUS

## 2023-02-02 MED ORDER — HYDRALAZINE HCL 20 MG/ML IJ SOLN
5.0000 mg | Freq: Once | INTRAMUSCULAR | Status: AC
  Administered 2023-02-02: 5 mg via INTRAVENOUS

## 2023-02-02 SURGICAL SUPPLY — 49 items
BANDAGE ESMARK 6X9 LF (GAUZE/BANDAGES/DRESSINGS) ×1 IMPLANT
BIT DRILL QC 2.0 SHORT EVOS SM (DRILL) IMPLANT
BIT DRILL QC 2.5MM SHRT EVO SM (DRILL) IMPLANT
BNDG COHESIVE 4X5 TAN STRL (GAUZE/BANDAGES/DRESSINGS) IMPLANT
BNDG ESMARK 6X9 LF (GAUZE/BANDAGES/DRESSINGS) ×1
BRUSH SCRUB EZ PLAIN DRY (MISCELLANEOUS) ×2 IMPLANT
CATH FOLEY 2WAY SLVR 5CC 14FR (CATHETERS) IMPLANT
DRAPE C-ARM 42X72 X-RAY (DRAPES) ×1 IMPLANT
DRAPE C-ARMOR (DRAPES) ×1 IMPLANT
DRAPE U-SHAPE 47X51 STRL (DRAPES) ×1 IMPLANT
DRILL QC 2.0 SHORT EVOS SM (DRILL) ×1
DRILL QC 2.5MM SHORT EVOS SM (DRILL) ×1
DRSG TEGADERM 4X10 (GAUZE/BANDAGES/DRESSINGS) IMPLANT
ELECT REM PT RETURN 9FT ADLT (ELECTROSURGICAL) ×1
ELECTRODE REM PT RTRN 9FT ADLT (ELECTROSURGICAL) ×1 IMPLANT
GAUZE SPONGE 4X4 12PLY STRL (GAUZE/BANDAGES/DRESSINGS) ×1 IMPLANT
GAUZE XEROFORM 5X9 LF (GAUZE/BANDAGES/DRESSINGS) IMPLANT
GLOVE BIOGEL PI IND STRL 7.5 (GLOVE) ×1 IMPLANT
GLOVE SURG ORTHO LTX SZ7.5 (GLOVE) ×2 IMPLANT
GOWN STRL REUS W/ TWL XL LVL3 (GOWN DISPOSABLE) ×1 IMPLANT
KIT BASIN OR (CUSTOM PROCEDURE TRAY) ×1 IMPLANT
KIT TURNOVER KIT B (KITS) ×1 IMPLANT
MANIFOLD NEPTUNE II (INSTRUMENTS) ×1 IMPLANT
NS IRRIG 1000ML POUR BTL (IV SOLUTION) ×1 IMPLANT
PACK ORTHO EXTREMITY (CUSTOM PROCEDURE TRAY) ×1 IMPLANT
PAD ARMBOARD 7.5X6 YLW CONV (MISCELLANEOUS) ×2 IMPLANT
PLATE FIB EVOS 11H L 2.7X147 (Plate) IMPLANT
PLATE TIB EVOS 15H L 2.7X195 (Plate) IMPLANT
SCREW CORT 2.7X12 EVOS (Screw) IMPLANT
SCREW CORT 2.7X14 T8 EVOS (Screw) IMPLANT
SCREW CORT 2.7X15 T8 ST EVOS (Screw) IMPLANT
SCREW CORT 2.7X40 STAR T8 EVOS (Screw) IMPLANT
SCREW CORT 3.5X10MM ST EVOS (Screw) IMPLANT
SCREW CORT 3.5X11 ST EVOS (Screw) IMPLANT
SCREW CORT 3.5X13MM (Screw) IMPLANT
SCREW CORT 3.5X20 ST EVOS (Screw) IMPLANT
SCREW CTX 3.5X36MM EVOS (Screw) IMPLANT
SCREW LOCK 3.5X14MM EVOS (Screw) IMPLANT
SCREW LOCK CORTICAL 3.5X42 (Screw) IMPLANT
SCREW LOCK EVOS ST 3.5X34 (Screw) IMPLANT
SCREW LOCK ST EVOS 2.7X36 (Screw) IMPLANT
SCREW LOCK T8 38X2.7XST (Screw) IMPLANT
SUCTION TUBE FRAZIER 10FR DISP (SUCTIONS) ×1 IMPLANT
SUT ETHILON 2 0 FS 18 (SUTURE) IMPLANT
SUT VIC AB 2-0 CT2 18 VCP726D (SUTURE) IMPLANT
TOWEL GREEN STERILE (TOWEL DISPOSABLE) ×2 IMPLANT
TRAY FOLEY MTR SLVR 16FR STAT (SET/KITS/TRAYS/PACK) IMPLANT
TUBE CONNECTING 12X1/4 (SUCTIONS) ×1 IMPLANT
YANKAUER SUCT BULB TIP NO VENT (SUCTIONS) ×1 IMPLANT

## 2023-02-02 NOTE — Discharge Instructions (Addendum)
 Orthopedic Surgery Discharge Instructions  Patient name: Kimberly Mccormick Fracture: left tibia and fibula fracture Procedure Performed: left tibia and fibula fracture open reduction internal fixation Date of Surgery: 02/02/2023 Surgeon: Ozell Ada, MD  Activity: You should not put any weight on your left lower extremity. The plates and screws are not designed to place weight through them. Putting weight through the leg can cause complications and result in further surgery. Swelling occurs around the ankle after this kind of surgery. In order to help with healing, reduce swelling, and decrease pain, please elevate your leg frequently. If you can, elevating your leg above the level of your heart is the best to reduce swelling and you should try to sleep with it in that position.   Incision Care: Your incision site has a dressing over it. That dressing should remain in place and dry at all times for a total of one week after surgery. After one week, you can remove the dressing. Underneath the dressing, you will find sutures. You should leave these sutures in place. They will be taken out in the office when the wound has healed. Do not pick, rub, or scrub at them. Do not put cream or lotion over the surgical area. After one week and once the dressing is off, it is okay to let soap and water  run over your incision. Again, do not pick, scrub, or rub at the sutures when bathing. Do not submerge (e.g., take a bath, swim, go in a hot tub, etc.) until six weeks after surgery. There may be some bloody drainage from the incision into the dressing after surgery. This is normal. You do not need to replace the dressing. Continue to leave it in place for the one week as instructed above. Should the dressing become saturated with blood or drainage, please call the office for further instructions.   Medications: You have been prescribed oxycodone . This is a narcotic pain medication and should only be taken as prescribed.  You should not drink alcohol or operate heavy machinery (including driving) while taking this medication. The oxycodone  can cause constipation as a side effect. For that reason, you have been prescribed senna and miralax . These are both laxatives. You do not need to take this medication if you develop diarrhea. Should you remain constipated even while taking the senna and miralax , please use the miralax  twice daily. Tylenol  has been prescribed to be taken every 8 hours, which will give you additional pain relief. Do not take your home ambien  with oxycodone . If you want to take the ambien , then you need to take your last dose of oxycodone  4 hours before taking the ambien .   You have been prescribed aspirin  as a blood thinner. This medication is to be taken to prevent blood clots. Take 81 milligrams twice daily. You should refrain from using other blood thinners (warfarin, apixaban, plavix, xarelto, etc.) while using the aspirin . You will need to take this medication for a total of 6 weeks after your surgery.   You should not use over-the-counter NSAIDs (ibuprofen , Aleve , Celebrex , naproxen , meloxicam, etc.) for pain relief because aspirin  is a similar medication. There can be side effects including but not limited to kidney injury and ulcers if you take these type of medications with the aspirin .  In order to set expectations for opioid prescriptions, you will only be prescribed opioids for a total of six weeks after surgery and, at two-weeks after surgery, your opioid prescription will start to tapered (decreased dosage and number  of pills). If you have ongoing need for opioid medication six weeks after surgery, you will be referred to pain management. If you are already established with a provider that is giving you opioid medications, you should schedule an appointment with them for six weeks after surgery if you feel you are going to need another prescription. State law only allows for opioid prescriptions  one week at a time. If you are running out of opioid medication near the end of the week, please call the office during business hours before running out so I can send you another prescription.   Diet: You are safe to resume your regular diet after surgery.   Reasons to Call the Office After Surgery: You should feel free to call the office with any concerns or questions you have in the post-operative period, but you should definitely notify the office if you develop: -shortness of breath, chest pain, or trouble breathing -excessive bleeding, drainage, redness, or swelling around the surgical site -fevers, chills, or pain that is getting worse with each passing day -persistent nausea or vomiting -new weakness in the left leg, new or worsening numbness or tingling in the left leg -other concerns about your surgery  Follow Up Appointments: You have a follow up appointment scheduled with Dr. Georgina on 02/20/2023 at 1:45pm. Please arrive on time to this appointment. The office location and phone number are listed below.   Office Information:  -Ozell Georgina, MD -Phone number: [phone] -Address: [address]       [place], [place] [address]

## 2023-02-02 NOTE — Progress Notes (Signed)
 Orthopedic Surgery Progress Note   Assessment: Patient is a 73 y.o. female with left tibia and fibula fractures status post ORIF   Plan: -Operative plans: complete -Diet: regular -DVT ppx: aspirin  81mg  BID -Antibiotics: ancef  x2 post-op doses -Weight bearing status: NWB LLE, okay for active range of motion at the ankle -PT/OT evaluate and treat -Pain control -Dispo: to floor from PACU  ___________________________________________________________________________  Subjective: No acute events since surgery. Recovering in PACU. Pain adequately controlled. Denies paresthesias and numbness.    Physical Exam:  General: no acute distress, appears stated age Neurologic: alert, answering questions appropriately, following commands Respiratory: unlabored breathing on nasal canula  MSK:   -Left lower extremity  Dressings c/d/i EHL/TA/GSC intact Plantarflexes and dorsiflexes toes Sensation intact to light touch in sural, saphenous, tibial, deep peroneal, and superficial peroneal nerve distributions Foot warm and well perfused, palpable DP pulse   Patient name: Kimberly Mccormick Patient MRN: [id number] Date: 02/02/23

## 2023-02-02 NOTE — Brief Op Note (Signed)
 02/02/2023  11:45 PM  PATIENT:  Kimberly Mccormick  73 y.o. female  PRE-OPERATIVE DIAGNOSIS:  left distal tibia/fibula fracture  POST-OPERATIVE DIAGNOSIS:  left distal tibia/fibula fracture  PROCEDURE:  Procedure(s): OPEN REDUCTION INTERNAL FIXATION (ORIF) LEFT TIBIA/FIBULA FRACTURE (Left)  SURGEON:  Surgeons and Role:    DEWAINE Georgina Ozell DELENA, MD - Primary  PHYSICIAN ASSISTANT: none  ASSISTANTS: none   ANESTHESIA:   general  EBL:  75 mL   BLOOD ADMINISTERED:none  DRAINS: none   LOCAL MEDICATIONS USED:  NONE  SPECIMEN:  No Specimen  DISPOSITION OF SPECIMEN:  N/A  COUNTS:  YES  TOURNIQUET:  NONE  DICTATION: .Note written in EPIC  PLAN OF CARE: Admit to inpatient   PATIENT DISPOSITION:  PACU - hemodynamically stable.   Delay start of Pharmacological VTE agent (>24hrs) due to surgical blood loss or risk of bleeding: no

## 2023-02-02 NOTE — Progress Notes (Signed)
 Orthopedic Tech Progress Note Patient Details:  Kimberly Mccormick [birth year] [id number]  Ortho Devices Type of Ortho Device: Post (short leg) splint, Stirrup splint Ortho Device/Splint Location: LLE Ortho Device/Splint Interventions: Ordered, Application, Adjustment   Post Interventions Patient Tolerated: Fair  Addi Pak A Floy Riegler 02/02/2023, 5:56 PM

## 2023-02-02 NOTE — Transfer of Care (Signed)
 Immediate Anesthesia Transfer of Care Note  Patient: Kimberly Mccormick  Procedure(s) Performed: OPEN REDUCTION INTERNAL FIXATION (ORIF) LEFT TIBIA/FIBULA FRACTURE (Left: Leg Lower)  Patient Location: PACU  Anesthesia Type:General  Level of Consciousness: awake, alert , and oriented  Airway & Oxygen  Therapy: Patient Spontanous Breathing and Patient connected to nasal cannula oxygen   Post-op Assessment: Report given to RN and Post -op Vital signs reviewed and stable  Post vital signs: Reviewed and stable  Last Vitals:  Vitals Value Taken Time  BP 164/100 02/02/23 2331  Temp    Pulse 72 02/02/23 2335  Resp 15 02/02/23 2335  SpO2 100 % 02/02/23 2335  Vitals shown include unfiled device data.  Last Pain:  Vitals:   02/02/23 1822  TempSrc: Oral  PainSc:          Complications: No notable events documented.

## 2023-02-02 NOTE — Anesthesia Procedure Notes (Signed)
 Procedure Name: Intubation Date/Time: 02/02/2023 8:19 PM  Performed by: Celia Alan HERO, CRNAPre-anesthesia Checklist: Patient identified, Emergency Drugs available, Suction available, Patient being monitored and Timeout performed Patient Re-evaluated:Patient Re-evaluated prior to induction Oxygen  Delivery Method: Circle system utilized Preoxygenation: Pre-oxygenation with 100% oxygen  Induction Type: IV induction Ventilation: Mask ventilation without difficulty Laryngoscope Size: Miller and 2 Grade View: Grade I Tube type: Oral Tube size: 7.0 mm Number of attempts: 1 Airway Equipment and Method: Stylet Placement Confirmation: ETT inserted through vocal cords under direct vision, positive ETCO2 and breath sounds checked- equal and bilateral Secured at: 22 cm Tube secured with: Tape Dental Injury: Teeth and Oropharynx as per pre-operative assessment

## 2023-02-02 NOTE — ED Triage Notes (Signed)
 Pt arrives via EMS where pt fell on ice when getting into car. Deformity to left lower leg. EMS gave 10 mg morphine and 50 benadryl after rash began. Denies hitting head or LOC.

## 2023-02-02 NOTE — ED Provider Notes (Signed)
 [place] [facility]ERGENC[facility] DEPARTMENT AT [facility] Provider Note  HPI   Kimberly Mccormick is a 73 y.o. female patient with a PMHx of breast cancer s/p resection, hyperlipidemia, on aspirin  baby who is here today after a fall.  Patient was just prior to arrival, she slipped on ice, and mechanical fall with no preceding symptoms, landed on her left leg, denies any her head lost consciousness she was not able to get up by herself, she is here today EMS believes that she has a left lower extremity injury, gave her 10 mg of morphine , she had a very faint rash on her arm reportedly by EMS, and she got 50 mg of IV Benadryl     ROS Negative except as per HPI   Medical Decision Making   Upon presentation, the patient is afebrile however she is hypertensive to 190s, otherwise she appears well, she has distal pulses of the left lower extremity 2+ DP and PT, however she has an obvious deformity of the lower extremity along the mid tib, is able to wiggle toes, and feel distal sensation.  There is no other bony point tenderness in arms or legs   Patient is here after mechanical fall, I do not think laboratory workup is indicated at this point, will obtain left lower extremity images, as well as a screening chest x-ray and pelvis x-ray given the trauma as well as a CT head and CT cervical spine, given the fact that she is an elderly female, had a very significant fall.  Right now her pain is under control, this is not an open fracture, no antibiotics needed no tetanus is needed, we will keep a close eye on this and redose her pain medications as needed   Distal commuted fracture of tibia and fibula, spoke to orthopedics, they will be admitting the patient, they had requested a CT ankle which is ordered as well we will follow-up results CT head CT cervical spine  CT head and CT cervical spine are negative, basic blood work largely unremarkable no actionable items.  Orthopedics will assume care of the  patient at this time   @DISPOSITION @  Rx / DC Orders ED Discharge Orders     None        Past Medical History:  Diagnosis Date   Aneurysm of left internal carotid artery    Small supraclinoid 5 mm   Breast cancer (HCC)    Left s/p lumpectomy (05/06/2018) and adjuvant chemotherapy (06/23/2018) with Adriamycin and Cytoxan  x4 followed by Taxol weekly x12   DDD (degenerative disc disease), lumbar    Essential hypertension    Genital warts    GERD (gastroesophageal reflux disease)    History of anemia    History of renal insufficiency    Hypertension    Insomnia    Iron deficiency anemia    Ischemic stroke [facility])    December 2022   Mixed hyperlipidemia    Osteoarthritis    Parkinson's disease (HCC)    Peripheral neuropathy    Related to chemotherapy   Personal history of chemotherapy 2020   Past Surgical History:  Procedure Laterality Date   APPENDECTOMY  1966   BIOPSY  08/29/2020   Procedure: BIOPSY;  Surgeon: Eartha Angelia Sieving, MD;  [facility];  Service: Gastroenterology;;   BIOPSY  07/24/2022   Procedure: BIOPSY;  Surgeon: Eartha Angelia Sieving, MD;  [facility];  Service: Gastroenterology;;   BREAST LUMPECTOMY WITH RADIOACTIVE SEED AND SENTINEL LYMPH NODE  BIOPSY Left 05/06/2018   Procedure: LEFT BREAST LUMPECTOMY WITH RADIOACTIVE SEED AND LEFT DEEP AXILLARY SENTINEL LYMPH NODE BIOPSY AND BLUE DYE INJECTION;  Surgeon: Gail Favorite, MD;  Location: [facility];  Service: General;  Laterality: Left;   CATARACT EXTRACTION W/PHACO Left 01/10/2014   Procedure: CATARACT EXTRACTION PHACO AND INTRAOCULAR LENS PLACEMENT ; CDE:  4.94;  Surgeon: Dow JULIANNA Burke, MD;  Location: [facility];  Service: Ophthalmology;  Laterality: Left;   CESAREAN SECTION  1986   CHOLECYSTECTOMY     COLONOSCOPY WITH PROPOFOL  N/A 07/25/2020   Procedure: COLONOSCOPY WITH PROPOFOL ;  Surgeon: Eartha Angelia Sieving, MD;  [facility];  Service:  Gastroenterology;  Laterality: N/A;  10:55   ESOPHAGOGASTRODUODENOSCOPY (EGD) WITH PROPOFOL  N/A 08/29/2020   Procedure: ESOPHAGOGASTRODUODENOSCOPY (EGD) WITH PROPOFOL ;  Surgeon: Eartha Angelia Sieving, MD;  [facility];  Service: Gastroenterology;  Laterality: N/A;  12:00   ESOPHAGOGASTRODUODENOSCOPY (EGD) WITH PROPOFOL  N/A 07/24/2022   Procedure: ESOPHAGOGASTRODUODENOSCOPY (EGD) WITH PROPOFOL ;  Surgeon: Eartha Angelia Sieving, MD;  [facility];  Service: Gastroenterology;  Laterality: N/A;  12:45;ASA 1-2   HOT HEMOSTASIS  07/24/2022   Procedure: HOT HEMOSTASIS (ARGON PLASMA COAGULATION/BICAP);  Surgeon: Eartha Angelia, Sieving, MD;  [facility];  Service: Gastroenterology;;   IR 3D INDEPENDENT WKST  04/25/2021   IR ANGIO INTRA EXTRACRAN SEL COM CAROTID INNOMINATE UNI R MOD SED  04/25/2021   IR ANGIO INTRA EXTRACRAN SEL INTERNAL CAROTID UNI L MOD SED  04/25/2021   IR ANGIOGRAM FOLLOW UP STUDY  04/25/2021   IR CT HEAD LTD  04/25/2021   IR NEURO EACH ADD'L AFTER BASIC UNI LEFT (MS)  04/25/2021   IR RADIOLOGIST EVAL & MGMT  03/22/2021   IR RADIOLOGIST EVAL & MGMT  03/28/2021   IR RADIOLOGIST EVAL & MGMT  05/15/2021   IR RADIOLOGIST EVAL & MGMT  09/28/2021   IR TRANSCATH/EMBOLIZ  04/25/2021   IR US  GUIDE VASC ACCESS RIGHT  04/25/2021   MYRINGOTOMY WITH TUBE PLACEMENT Right 02/04/2017   Procedure: REVISION OF RIGHT MYRINGOTOMY WITH TUBE PLACEMENT, WITH EXAM OF LEFT EAR;  Surgeon: Karis Clunes, MD;  Location: [facility];  Service: ENT;  Laterality: Right;   NASAL SINUS SURGERY  2016   polypectomy   POLYPECTOMY  07/25/2020   Procedure: POLYPECTOMY;  Surgeon: Eartha Angelia Sieving, MD;  [facility];  Service: Gastroenterology;;   POLYPECTOMY  08/29/2020   Procedure: POLYPECTOMY;  Surgeon: Eartha Angelia Sieving, MD;  [facility];  Service: Gastroenterology;;   POLYPECTOMY  07/24/2022   Procedure: POLYPECTOMY;  Surgeon: Eartha Angelia Sieving,  MD;  [facility];  Service: Gastroenterology;;   [facility] REMOVAL N/A 01/06/2019   Procedure: REMOVAL PORT-A-CATH;  Surgeon: Gail Favorite, MD;  Location: [facility];  Service: General;  Laterality: N/A;   PORTACATH PLACEMENT Right 06/16/2018   Procedure: INSERTION PORT-A-CATH;  Surgeon: Gail Favorite, MD;  Location: [facility];  Service: General;  Laterality: Right;   RADIOLOGY WITH ANESTHESIA N/A 04/25/2021   Procedure: IR WITH ANESTHESIA EMBOLIZATION;  Surgeon: Dolphus Carrion, MD;  Location: [facility];  Service: Radiology;  Laterality: N/A;   SCLEROTHERAPY  07/24/2022   Procedure: SCLEROTHERAPY;  Surgeon: Eartha Angelia, Sieving, MD;  [facility];  Service: Gastroenterology;;   SUBMUCOSAL LIFTING INJECTION  08/29/2020   Procedure: SUBMUCOSAL LIFTING INJECTION;  Surgeon: Eartha Angelia Sieving, MD;  [facility];  Service: Gastroenterology;;   SUBMUCOSAL LIFTING INJECTION  07/24/2022   Procedure: SUBMUCOSAL LIFTING INJECTION;  Surgeon: Eartha Flavors, Toribio, MD;  [facility];  Service: Gastroenterology;;   TONSILLECTOMY  1970   Family History  Problem Relation Age of Onset   Lupus Mother    Heart attack Father        age 30   Prostate cancer Father    Hypothyroidism Sister    Breast cancer Sister    Hypertension Sister    Breast cancer Sister    Hypertension Brother    Parkinson's disease Paternal Aunt    Stroke Neg Hx    Social History   Socioeconomic History   Marital status: Legally Separated    Spouse name: Not on file   Number of children: 2   Years of education: Not on file   Highest education level: Not on file  Occupational History    Comment: retired  Tobacco Use   Smoking status: Never   Smokeless tobacco: Never  Vaping Use   Vaping status: Never Used  Substance and Sexual Activity   Alcohol use: No   Drug use: No   Sexual activity: Not Currently    Birth control/protection:  Post-menopausal  Other Topics Concern   Not on file  Social History Narrative   One son lives with her   Social Drivers of Health   Financial Resource Strain: Low Risk  (01/08/2021)   Overall Financial Resource Strain (CARDIA)    Difficulty of Paying Living Expenses: Not hard at all  Food Insecurity: No Food Insecurity (02/02/2023)   Hunger Vital Sign    Worried About Running Out of Food in the Last Year: Never true    Ran Out of Food in the Last Year: Never true  Transportation Needs: No Transportation Needs (02/02/2023)   PRAPARE - Administrator, Civil Service (Medical): No    Lack of Transportation (Non-Medical): No  Physical Activity: Inactive (01/08/2021)   Exercise Vital Sign    Days of Exercise per Week: 0 days    Minutes of Exercise per Session: 0 min  Stress: No Stress Concern Present (01/08/2021)   [employer] of Occupational Health - Occupational Stress Questionnaire    Feeling of Stress : Not at all  Social Connections: Moderately Integrated (02/02/2023)   Social Connection and Isolation Panel [NHANES]    Frequency of Communication with Friends and Family: More than three times a week    Frequency of Social Gatherings with Friends and Family: More than three times a week    Attends Religious Services: More than 4 times per year    Active Member of Clubs or Organizations: Yes    Attends Banker Meetings: More than 4 times per year    Marital Status: Divorced  Intimate Partner Violence: Not At Risk (02/02/2023)   Humiliation, Afraid, Rape, and Kick questionnaire    Fear of Current or Ex-Partner: No    Emotionally Abused: No    Physically Abused: No    Sexually Abused: No     Physical Exam   Vitals:   02/02/23 1645 02/02/23 1700 02/02/23 1715 02/02/23 1822  BP: (!) 181/74 (!) 153/74 (!) 160/87 117/82  Pulse: (!) 57 (!) 57 65 (!) 59  Resp: 19 15 16 15   Temp:    (!) 97.5 F (36.4 C)  TempSrc:    Oral  SpO2: 100% 100% 99% 100%   Weight:      Height:        Physical Exam Vitals and  nursing note reviewed.  Constitutional:      General: She is not in acute distress.    Appearance: She is well-developed.  HENT:     Head: Normocephalic and atraumatic.     Right Ear: External ear normal.     Left Ear: External ear normal.     Nose: Nose normal.     Mouth/Throat:     Mouth: Mucous membranes are moist.  Eyes:     Conjunctiva/sclera: Conjunctivae normal.  Neck:     Comments: There is no cervical thoracic or lumbar midline tenderness Cardiovascular:     Rate and Rhythm: Normal rate and regular rhythm.     Heart sounds: No murmur heard. Pulmonary:     Effort: Pulmonary effort is normal. No respiratory distress.     Breath sounds: Normal breath sounds.  Abdominal:     Palpations: Abdomen is soft.     Tenderness: There is no abdominal tenderness.  Musculoskeletal:     Cervical back: Neck supple.     Comments: she has distal pulses of the left lower extremity 2+ DP and PT, however she has an obvious deformity of the lower extremity along the mid tib, is able to wiggle toes, and feel distal sensation.  There is no other bony point tenderness in arms or legs   Skin:    General: Skin is warm and dry.     Capillary Refill: Capillary refill takes less than 2 seconds.  Neurological:     General: No focal deficit present.     Mental Status: She is alert and oriented to person, place, and time.     Comments: Moving all fours except left lower extremity secondary to pain sensation diffusely intact  Psychiatric:        Mood and Affect: Mood normal.      Procedures   If procedures were preformed on this patient, they are listed below:  Procedures  The patient was seen, evaluated, and treated in conjunction with the attending physician, who voiced agreement in the care provided.  Note generated using Dragon voice dictation software and may contain dictation errors. Please contact me for any clarification or with  any questions.   Electronically signed by:  Fairy Kerby Revere, M.D. (PGY-2)    Revere Fairy, MD 02/02/23 1910    Pamella Ozell LABOR, DO 02/06/23 1624

## 2023-02-02 NOTE — H&P (Addendum)
 Orthopedic Surgery H&P Note  Assessment: Patient is a 73 y.o. female with left distal tibia and fibula fractures   Plan: -These fractures will need operative fixation -Diet: NPO for procedure -DVT ppx: aspirin  81mg  BID post-operatively -Ancef  and TXA on call to OR -Weight bearing status: NWB LLE -PT evaluate and treat post-operatively -Pain control -Dispo: pending completion of operative plans   Discussed recommendation for operative intervention in the form of left distal tibia and fibula open reduction and internal fixation. Explained the risks of this procedure included, but were not limited to: nonunion, malunion, hardware failure/malposition, infection, bleeding, stiffness, need for additional procedures, deep vein thrombosis, pulmonary embolism, and death. The benefits of this procedure would be to promote fracture healing by providing stability and to heal the fracture in the appropriate alignment. The alternatives of this surgery would be to treat the fracture with immobilization in a splint/brace/cast or to do no intervention. The patient's questions were answered to her satisfaction. After this discussion, patient elected to proceed with surgery. Informed consent was obtained.   ___________________________________________________________________________   Chief complaint: left ankle pain  History:  Patient is a 73 y.o. female who had a fall earlier today on the ice. She fell down a hill. She noted immediate onset of left leg pain and was unable to ambulate. She was brought to [facility] ER after the fall. She was found to have a distal tibia fracture. Orthopedics was consulted. She is having left ankle pain but no other extremity pain. Denies paresthesias and numbness.   Review of systems: General: denies fevers and chills, myalgias Neurologic: denies recent changes in vision, slurred speech Abdomen: denies nausea, vomiting, hematemesis Respiratory: denies cough, shortness of  breath  Past medical history:  Breast cancer history HSV GERD HTN Parkinson's disease  Allergies: penicillin   Past surgical history:  Appendectomy Cholecystectomy Polypectomy Breast lumpectomy Nasal sinus surgery  Social history: Denies use of nicotine-containing products (cigarettes, vaping, smokeless, etc.) Alcohol use: denies Denies use of recreational drugs  Family history: -reviewed and not pertinent to distal tibia fracture   Physical Exam:  BMI of 23.4  General: no acute distress, appears stated age Neurologic: alert, answering questions appropriately, following commands Cardiovascular: regular rate, no cyanosis Respiratory: unlabored breathing on room air, symmetric chest rise Psychiatric: appropriate affect, normal cadence to speech  MSK:   -Bilateral upper extremities  No tenderness to palpation over extremity, no gross deformity, no open wounds Fires deltoid, biceps, triceps, wrist extensors, wrist flexors, finger extensors, finger flexors  AIN/PIN/IO intact  Palpable radial pulse  Sensation intact to light touch in median/ulnar/radial/axillary nerve distributions  Hand warm and well perfused  -Right lower extremity  No tenderness to palpation over extremity, no gross deformity, no open wounds, no pain with log roll Fires hip flexors, quadriceps, hamstrings, tibialis anterior, gastrocnemius and soleus, extensor hallucis longus Plantarflexes and dorsiflexes toes Sensation intact to light touch in sural, saphenous, tibial, deep peroneal, and superficial peroneal nerve distributions Foot warm and well perfused, palpable DP pulse  -Bilateral lower extremities  No tenderness to palpation over extremity, except over the tibia and fibula. Swelling seen medially at the distal tibia. Varus alignment to the distal limb. No open wounds. No groin pain with log roll.  Does not fire hip flexors, quadriceps, hamstrings due to pain. Fires tibialis anterior,  gastrocnemius and soleus, extensor hallucis longus Plantarflexes and dorsiflexes toes Sensation intact to light touch in sural, saphenous, tibial, deep peroneal, and superficial peroneal nerve distributions Foot warm and well  perfused, palpable DP pulse  Imaging: XRs of the left tibia from 02/02/2023 was independently reviewed and interpreted, showing displaced comminuted spiral fracture of the distal tibia with varus alignment. Comminuted fibula fracture seen as well.    Patient name: Kimberly Mccormick Patient MRN: [id number] Date: 02/02/23  Pre-operative Scores  VAS leg: 9/10

## 2023-02-02 NOTE — Anesthesia Preprocedure Evaluation (Addendum)
 Anesthesia Evaluation  Patient identified by MRN, date of birth, ID band Patient awake    Reviewed: Allergy & Precautions, NPO status , Patient's Chart, lab work & pertinent test results  Airway Mallampati: II  TM Distance: >3 FB Neck ROM: Full    Dental  (+) Upper Dentures, Lower Dentures   Pulmonary neg pulmonary ROS   Pulmonary exam normal        Cardiovascular hypertension, Pt. on home beta blockers Normal cardiovascular exam     Neuro/Psych Parkinson's disease  Neuromuscular disease CVA  negative psych ROS   GI/Hepatic Neg liver ROS,GERD  Medicated and Controlled,,  Endo/Other  negative endocrine ROS    Renal/GU Renal disease     Musculoskeletal  (+) Arthritis ,    Abdominal   Peds  Hematology negative hematology ROS (+)   Anesthesia Other Findings left distal tibia/fibula fracture  Reproductive/Obstetrics                             Anesthesia Physical Anesthesia Plan  ASA: 3 and emergent  Anesthesia Plan: General   Post-op Pain Management:    Induction: Intravenous  PONV Risk Score and Plan: 3 and Ondansetron , Dexamethasone  and Treatment may vary due to age or medical condition  Airway Management Planned: Oral ETT  Additional Equipment:   Intra-op Plan:   Post-operative Plan: Extubation in OR  Informed Consent: I have reviewed the patients History and Physical, chart, labs and discussed the procedure including the risks, benefits and alternatives for the proposed anesthesia with the patient or authorized representative who has indicated his/her understanding and acceptance.     Dental advisory given  Plan Discussed with: CRNA  Anesthesia Plan Comments:        Anesthesia Quick Evaluation

## 2023-02-03 ENCOUNTER — Encounter (HOSPITAL_COMMUNITY): Payer: 59

## 2023-02-03 ENCOUNTER — Encounter (HOSPITAL_COMMUNITY): Payer: Self-pay | Admitting: Orthopedic Surgery

## 2023-02-03 ENCOUNTER — Ambulatory Visit (HOSPITAL_COMMUNITY): Payer: 59 | Admitting: Speech Pathology

## 2023-02-03 ENCOUNTER — Inpatient Hospital Stay (HOSPITAL_COMMUNITY): Payer: 59

## 2023-02-03 LAB — CBC
HCT: 39.6 % (ref 36.0–46.0)
Hemoglobin: 12.9 g/dL (ref 12.0–15.0)
MCH: 26.7 pg (ref 26.0–34.0)
MCHC: 32.6 g/dL (ref 30.0–36.0)
MCV: 82 fL (ref 80.0–100.0)
Platelets: 237 10*3/uL (ref 150–400)
RBC: 4.83 MIL/uL (ref 3.87–5.11)
RDW: 13.1 % (ref 11.5–15.5)
WBC: 11.3 10*3/uL — ABNORMAL HIGH (ref 4.0–10.5)
nRBC: 0 % (ref 0.0–0.2)

## 2023-02-03 LAB — BASIC METABOLIC PANEL
Anion gap: 16 — ABNORMAL HIGH (ref 5–15)
BUN: 11 mg/dL (ref 8–23)
CO2: 21 mmol/L — ABNORMAL LOW (ref 22–32)
Calcium: 8.7 mg/dL — ABNORMAL LOW (ref 8.9–10.3)
Chloride: 100 mmol/L (ref 98–111)
Creatinine, Ser: 1.09 mg/dL — ABNORMAL HIGH (ref 0.44–1.00)
GFR, Estimated: 53 mL/min — ABNORMAL LOW (ref >60)
Glucose, Bld: 181 mg/dL — ABNORMAL HIGH (ref 70–99)
Potassium: 3 mmol/L — ABNORMAL LOW (ref 3.5–5.1)
Sodium: 137 mmol/L (ref 135–145)

## 2023-02-03 LAB — TYPE AND SCREEN
ABO/RH(D): O POS
Antibody Screen: NEGATIVE

## 2023-02-03 LAB — ABO/RH: ABO/RH(D): O POS

## 2023-02-03 MED ORDER — ASPIRIN 81 MG PO CHEW
81.0000 mg | CHEWABLE_TABLET | Freq: Two times a day (BID) | ORAL | Status: DC
  Administered 2023-02-03 – 2023-02-05 (×5): 81 mg via ORAL
  Filled 2023-02-03 (×5): qty 1

## 2023-02-03 MED ORDER — POTASSIUM CHLORIDE 20 MEQ PO PACK
20.0000 meq | PACK | Freq: Once | ORAL | Status: AC
  Administered 2023-02-03: 20 meq via ORAL
  Filled 2023-02-03: qty 1

## 2023-02-03 MED ORDER — LIDOCAINE 5 % EX PTCH
1.0000 | MEDICATED_PATCH | CUTANEOUS | Status: DC
  Administered 2023-02-05: 1 via TRANSDERMAL
  Filled 2023-02-03 (×3): qty 1

## 2023-02-03 MED ORDER — METOPROLOL SUCCINATE ER 25 MG PO TB24
25.0000 mg | ORAL_TABLET | Freq: Every day | ORAL | Status: DC
  Administered 2023-02-03 – 2023-02-05 (×3): 25 mg via ORAL
  Filled 2023-02-03 (×3): qty 1

## 2023-02-03 MED ORDER — PRAMIPEXOLE DIHYDROCHLORIDE 0.25 MG PO TABS
0.7500 mg | ORAL_TABLET | Freq: Three times a day (TID) | ORAL | Status: DC
Start: 2023-02-03 — End: 2023-02-05
  Administered 2023-02-03 – 2023-02-05 (×7): 0.75 mg via ORAL
  Filled 2023-02-03 (×9): qty 3

## 2023-02-03 MED ORDER — OXYCODONE HCL 5 MG PO TABS
ORAL_TABLET | ORAL | Status: AC
  Filled 2023-02-03: qty 2

## 2023-02-03 MED ORDER — HYDRALAZINE HCL 20 MG/ML IJ SOLN
5.0000 mg | INTRAMUSCULAR | Status: DC | PRN
  Administered 2023-02-03: 5 mg via INTRAVENOUS

## 2023-02-03 MED ORDER — ACYCLOVIR 400 MG PO TABS
400.0000 mg | ORAL_TABLET | Freq: Two times a day (BID) | ORAL | Status: DC
  Administered 2023-02-03 – 2023-02-05 (×5): 400 mg via ORAL
  Filled 2023-02-03 (×6): qty 1

## 2023-02-03 MED ORDER — POTASSIUM CHLORIDE 20 MEQ PO PACK
40.0000 meq | PACK | Freq: Once | ORAL | Status: AC
  Administered 2023-02-03: 40 meq via ORAL
  Filled 2023-02-03: qty 2

## 2023-02-03 MED ORDER — PANTOPRAZOLE SODIUM 40 MG PO TBEC
40.0000 mg | DELAYED_RELEASE_TABLET | Freq: Every day | ORAL | Status: DC
  Administered 2023-02-03 – 2023-02-05 (×3): 40 mg via ORAL
  Filled 2023-02-03 (×3): qty 1

## 2023-02-03 MED ORDER — OXYCODONE HCL 5 MG PO TABS
10.0000 mg | ORAL_TABLET | Freq: Once | ORAL | Status: AC
  Administered 2023-02-03: 10 mg via ORAL

## 2023-02-03 MED ORDER — HYDROMORPHONE HCL 1 MG/ML IJ SOLN
0.2500 mg | INTRAMUSCULAR | Status: DC | PRN
  Administered 2023-02-03 (×2): 0.5 mg via INTRAVENOUS

## 2023-02-03 MED ORDER — HYDROMORPHONE HCL 1 MG/ML IJ SOLN
INTRAMUSCULAR | Status: AC
  Filled 2023-02-03: qty 1

## 2023-02-03 MED ORDER — CEFAZOLIN SODIUM-DEXTROSE 2-4 GM/100ML-% IV SOLN
2.0000 g | Freq: Four times a day (QID) | INTRAVENOUS | Status: AC
  Administered 2023-02-03 (×3): 2 g via INTRAVENOUS
  Filled 2023-02-03 (×3): qty 100

## 2023-02-03 MED ORDER — TRANEXAMIC ACID-NACL 1000-0.7 MG/100ML-% IV SOLN
1000.0000 mg | Freq: Once | INTRAVENOUS | Status: AC
  Administered 2023-02-03: 1000 mg via INTRAVENOUS
  Filled 2023-02-03: qty 100

## 2023-02-03 NOTE — Anesthesia Postprocedure Evaluation (Signed)
 Anesthesia Post Note  Patient: Kimberly Mccormick  Procedure(s) Performed: OPEN REDUCTION INTERNAL FIXATION (ORIF) LEFT TIBIA/FIBULA FRACTURE (Left: Leg Lower)     Patient location during evaluation: PACU Anesthesia Type: General Level of consciousness: awake Pain management: pain level controlled Vital Signs Assessment: post-procedure vital signs reviewed and stable Respiratory status: spontaneous breathing, nonlabored ventilation and respiratory function stable Cardiovascular status: blood pressure returned to baseline and stable Postop Assessment: no apparent nausea or vomiting Anesthetic complications: no   No notable events documented.  Last Vitals:  Vitals:   02/03/23 0117 02/03/23 0524  BP: (!) 145/68 126/70  Pulse: 79 70  Resp: 17 17  Temp: 36.4 C 36.8 C  SpO2: 100% 100%    Last Pain:  Vitals:   02/03/23 0531  TempSrc:   PainSc: 4                  Airis Barbee P Fedra Lanter

## 2023-02-03 NOTE — TOC CAGE-AID Note (Signed)
 Transition of Care [facility]) - CAGE-AID Screening   Patient Details  Name: Kimberly Mccormick MRN: [id number] Date of Birth: 30-Oct-1950  Transition [facility]) CM/SW Contact:    LEBRON ROCKIE LELON, RN Phone Number: [phone] 02/03/2023, 4:53 PM   Clinical Narrative: Pt in hospital after sustaining a left tib fib fx.  Pt denies alcohol or drug use.  Screening complete.    CAGE-AID Screening:    Have You Ever Felt You Ought to Cut Down on Your Drinking or Drug Use?: No Have People Annoyed You By Critizing Your Drinking Or Drug Use?: No Have You Felt Bad Or Guilty About Your Drinking Or Drug Use?: No Have You Ever Had a Drink or Used Drugs First Thing In The Morning to Steady Your Nerves or to Get Rid of a Hangover?: No CAGE-AID Score: 0  Substance Abuse Education Offered: No

## 2023-02-03 NOTE — Op Note (Addendum)
 Orthopedic Surgery Operative Report   Procedure: Left distal tibia and fibula shaft fractures open reduction with internal fixation   Modifier: none   Date of procedure: 02/02/2023   Patient name: Kimberly Mccormick   MRN: [id number]  DOB: 12/12/1950   Surgeon: Ozell Ada, MD Assistant: None Pre-operative diagnosis: closed left distal tibia and fibula fractures Post-operative diagnosis: same as above Findings: displaced, comminuted distal tibia and fibula fractures   Specimens: none Anesthesia: general EBL: 75cc Complications: none Pre-incision antibiotic: ancef  TXA was given prior to incision as well   Implants:  Implant Name Type Inv. Item Serial No. Manufacturer Lot No. LRB No. Used Action  PLATE TIB EVOS 84Y L 2.7X195 - [id number] Plate PLATE TIB EVOS 84Y L 2.7X195  [employer]  Left 1 Implanted  SCREW CTX 3.5X36MM EVOS - [id number] Screw SCREW CTX 3.5X36MM EVOS  [employer]  Left 1 Implanted and Explanted  SCREW LOCK CORTICAL 3.5X42 - [id number] Screw SCREW LOCK CORTICAL 3.5X42  [employer]  Left 1 Implanted  SCREW CORT 3.5X20 ST EVOS - [id number] Screw SCREW CORT 3.5X20 ST EVOS  [employer]  Left 4 Implanted  SCREW LOCK EVOS ST 3.5X34 - [id number] Screw SCREW LOCK EVOS ST 3.5X34  [employer]  Left 1 Implanted  SCREW CORT 2.7X40 STAR T8 EVOS - [id number] Screw SCREW CORT 2.7X40 STAR T8 EVOS  [employer]  Left 1 Implanted  SCREW LOCK ST EVOS 2.7X36 - [id number] Screw SCREW LOCK ST EVOS 2.7X36  [employer]  Left 1 Implanted  SCREW LOCK T8 38X2.7XST - [id number] Screw SCREW LOCK T8 38X2.7XST  [employer]  Left 2 Implanted  SCREW CORT 2.7X40 STAR T8 EVOS - [id number] Screw SCREW CORT 2.7X40 STAR T8 EVOS  [employer]  Left 1 Implanted and Explanted  PLATE FIB EVOS 88Y L 2.7X147 - [id number] Plate PLATE FIB EVOS 88Y L 2.7X147  [employer]  Left 1 Implanted  SCREW CORT 3.5X13MM - [id number] Screw SCREW CORT 3.5X13MM  [employer]  Left 1 Implanted  SCREW CORT 2.7X15 T8 ST EVOS - [id number] Screw SCREW CORT 2.7X15 T8 ST EVOS  [employer]  Left 1 Implanted  SCREW CORT 3.5X10MM ST EVOS - [id number] Screw SCREW CORT 3.5X10MM ST EVOS  [employer]  Left 1 Implanted  SCREW CORT 3.5X11 ST EVOS - [id number] Screw SCREW CORT 3.5X11 ST EVOS  [employer]  Left 1 Implanted  SCREW CORT 2.7X12 EVOS - [id number] Screw SCREW CORT 2.7X12 EVOS  [employer]  Left 1 Implanted  SCREW CORT 2.7X14 T8 EVOS - [id number] Screw SCREW CORT 2.7X14 T8 EVOS  [employer]  Left 1 Implanted  SCREW LOCK 3.5X14MM EVOS - [id number] Screw SCREW LOCK 3.5X14MM EVOS  [employer]  Left 1 Implanted       Indication for procedure: Patient is a 73 y.o. female who presented to the ER after a ground level fall. She slipped on ice and fell down a hill. She noted immediate onset of left ankle pain. She was unable to ambulate and was brought to [facility] ER. She was found to have a displaced distal tibia and fibula fracture. Orthopedic was consulted. I met the patient and discussed the fractures. I recommended operative management in the form of  open reduction internal fixation to stabilize the fracture and allow  for healing in good alignment. Explained the risks of this procedure included, but were not limited to: nonunion, malunion, fixation failure, infection, bleeding, stiffness, need for additional procedures, deep vein thrombosis, pulmonary embolism, and death. The alternatives of this surgery would be to treat the fracture with immobilization or to perform no intervention. After our discussion, patient elected to proceed with surgery.    Procedure Description: The patient was met in the pre-operative holding area. The patient's identity and  consent were verified. The operative site was marked by myself. The patient's remaining questions about the surgery were answered. The patient was brought back to the operating room. General anesthesia was induced. The patient was transferred to the operating table in the supine position. All bony prominences were well padded. The leg was cleansed with a scrub brush and alcohol. Ancef  and TXA were administered by anesthesia. The patient's skin was then prepped and draped in a standard, sterile fashion. A time out was performed that identified the patient, the procedure, and the operative site. All team members agreed with what was stated in the time out.    A longitudinal incision was made over the medial malleolus. Sharp dissection was taken down to the level of the bone. A wood handle elevator was used to develop a plane along the tibia going proximally. Fluoroscopy was brought in to identify a site just proximal to the fracture. Another longitudinal incision was made just proximal to the fracture. It was taken sharply down through skin and dermis. Blunt dissection was performed with scissors to the level of the tibia. A wood handle elevator was used to develop a plane along the tibia going distally. A plate was selected and placed along the skin of the leg. An AP image was taken to determine if this plate would be good length. A 15 hole plate was selected. The plate was placed along the medial malleolus and then slid along the tibia proximally. The plate was then seen at the more proximal incision. The plate was placed in a desirable location along the distal aspect of the tibia. A clamp was then used proximally to secure the plate to the proximal tibia. A second clamp was then placed around the tibia and around the distal aspect of the plate to reduce the tibia. AP fluoroscopy was used to check the reduction and make adjustments with the clamp. Once satisfactory reduction was obtained, the clamp was secured in  place. A lateral image was obtained which showed some displacement. Then, a pointed reduction clamp was then used from anterior to posterior over the distal tibia to obtain a satisfactory reduction in the sagittal plane. AP and lateral fluoroscopy was then used to check the plate placement which confirmed satisfactory placement. A drill was used just distal to the fracture site. The tibia was drilled bicortically. A depth gauge was used to estimate the screw length. That length cortical 3.14mm screw was then inserted. Attention was then turned to the distal locking cluster of screws. The locking guide was placed onto the screw hole. The hole was drilled unicortically. A depth gauge was used to estimate the screw length. That length locking screw was then inserted. This process was repeated to insert all the distal locking screws. Next, the proximal screws were inserted. A drill was used to drill bicortically. A depth gauge was used to estimate the screw length. That length 3.44mm cortical screw was then inserted until their was good purchase. This process was repeated to insert a total of 4  cortical screws proximal to the fracture. The clamps were taken off. AP and lateral fluoroscopic images showed satisfactory fixation placement and reduction of the tibia.   Attention was then turned to the fibula. A longitudinal incision was made over the lateral malleolus and extended up to the shaft of the fibula. At the distal tip of the malleolus, the incision was taken sharply down to bone. More proximal to that, scissors were used to bluntly dissect down to bone. The scissors were also used to dissect down to bone a the proximal most aspect of the incision. In the middle of the incision, dissection was taken down to the level of the periosteum. All the overlying soft tissue over the bone was preserved. A lateral malleolus plate was then selected. It was checked under fluoroscopy. A 11 hole plate was selected. The plate was  placed over the lateral aspect of the fibula. One of the distal locking holes was drilled unicortically. A depth gauge was used to estimate the screw length. That length screw was inserted but was not tightened all the way down. The plate was then rotated to lay along the lateral aspect of the fibula more proximally. A clamp was used to hold it in place. A drill was used to drill one of the holes proximal to the fracture bicortically. A depth gauge was used to measure the screw length. That length 3.61mm cortical screw was the inserted and advanced until their was good purchase. This same process was repeated to insert 2 more cortical screws proximal to the fracture. Then, attention was turned to the distal locking holes. The locking guide was placed onto the screw hole. A drill was used to drill the distal malleolus unicortically. A depth gauge was used to estimate the screw length. That length locking screw was then inserted until it locked into the plate. This same process was repeated to insert all this distal locking screws. The first screw that was placed that had not been tightened all the way, was tightened and locked into the plate at this point. The clamp proximally was removed. Final AP and lateral fluoroscopic images showed satisfactory position of the fixation and reduction of both the tibia and fibula fractures.   The wounds were copiously irrigated with sterile saline. The deep dermal layer was closed with 2-0 vicryl. The skin was closed with 2-0 nylon suture. Dressings were applied. All counts were correct at the end of the case. Patient was transferred back to a hospital bed. The patient was awakened from anesthesia and brought back to the post-anesthesia care unit in stable condition.     Post-operative plan: The patient will recover in the post-anesthesia care unit and then go to the floor. The patient will receive two post-operative doses of ancef . She will get another dose of TXA as well. The  patient will be non weight bearing on the left lower extremity. The patient will work with physical therapy. The patient's disposition will be determined based off how she does with PT.    Ozell Ada, MD Orthopedic Surgeon

## 2023-02-03 NOTE — Progress Notes (Signed)
 Orthopedic Surgery Progress Note   Assessment: Patient is a 73 y.o. female with left tibia and fibula fractures status post ORIF   Plan: -Operative plans: complete -Diet: regular -DVT ppx: aspirin  81mg  BID -Antibiotics: ancef  x2 post-op doses -Weight bearing status: NWB LLE, okay for active range of motion at the ankle -PT/OT evaluate and treat -Pain control -Dispo: remain floor status  ___________________________________________________________________________  Subjective: No acute events overnight. Was able to get some sleep after surgery. Pain well controlled with current medications. Denies paresthesias and numbness in the left leg. No complaints this morning.    Physical Exam:  General: no acute distress, appears stated age, laying in bed Neurologic: alert, answering questions appropriately, following commands Respiratory: unlabored breathing on room air  MSK:   -Left lower extremity  Dressings c/d/i EHL/TA/GSC intact Plantarflexes and dorsiflexes toes Sensation intact to light touch in sural, saphenous, tibial, deep peroneal, and superficial peroneal nerve distributions Foot warm and well perfused, palpable DP pulse   Patient name: Kimberly Mccormick Patient MRN: [id number] Date: 02/03/23

## 2023-02-03 NOTE — Evaluation (Signed)
 Occupational Therapy Evaluation Patient Details Name: Kimberly Mccormick MRN: [id number] DOB: 1950-08-06 Today's Date: 02/03/2023   History of Present Illness 73 y/o female presenting with left distal tibia and fibula fracture s/p ORIF on 1/12. PMH includes breast cancer hx, HSV, GERD, HTN, and Parkinson's disease.   Clinical Impression   Pt c/o no pain at rest, feeling good. Pt lives in 1 story home with 4 STE, son lives with her and works 3rd shift, Pt states grandson can come by to assist as needed as well. Pt PLOF fully independent. Pt currently NWB to LLE, able to use RW to ambulate ~20 feet at CGA. Pt able to complete ADLs with set up sitting. Pt would benefit from use of shower chair and RW for return home. Pt doing well and has ample support at home, no further OT needs or follow up needed.       If plan is discharge home, recommend the following: A little help with walking and/or transfers;A little help with bathing/dressing/bathroom;Assistance with cooking/housework;Help with stairs or ramp for entrance;Assist for transportation    Functional Status Assessment  Patient has had a recent decline in their functional status and demonstrates the ability to make significant improvements in function in a reasonable and predictable amount of time.  Equipment Recommendations  Tub/shower seat;Other (comment) (RW)    Recommendations for Other Services       Precautions / Restrictions Precautions Precautions: Fall Restrictions Weight Bearing Restrictions Per Provider Order: Yes LLE Weight Bearing Per Provider Order: Non weight bearing      Mobility Bed Mobility Overal bed mobility: Modified Independent                  Transfers Overall transfer level: Needs assistance Equipment used: Rolling walker (2 wheels) Transfers: Sit to/from Stand, Bed to chair/wheelchair/BSC Sit to Stand: Modified independent (Device/Increase time)     Step pivot transfers: Contact guard assist      General transfer comment: CGA for taking steps with RW      Balance Overall balance assessment: Mild deficits observed, not formally tested                                         ADL either performed or assessed with clinical judgement   ADL Overall ADL's : Needs assistance/impaired Eating/Feeding: Independent   Grooming: Set up   Upper Body Bathing: Set up   Lower Body Bathing: Set up   Upper Body Dressing : Set up   Lower Body Dressing: Set up   Toilet Transfer: Contact guard assist;Rolling walker (2 wheels)   Toileting- Clothing Manipulation and Hygiene: Set up   Tub/ Shower Transfer: Contact guard assist;Rolling walker (2 wheels);Shower seat   Functional mobility during ADLs: Contact guard assist;Rolling walker (2 wheels) General ADL Comments: set up/CGA for ADLs     Vision Baseline Vision/History: 0 No visual deficits Ability to See in Adequate Light: 0 Adequate Patient Visual Report: No change from baseline       Perception         Praxis         Pertinent Vitals/Pain Pain Assessment Pain Assessment: No/denies pain     Extremity/Trunk Assessment Upper Extremity Assessment Upper Extremity Assessment: Overall WFL for tasks assessed           Communication Communication Communication: Hearing impairment   Cognition Arousal: Alert Behavior During Therapy: Kimberly Mccormick for  tasks assessed/performed Overall Cognitive Status: Within Functional Limits for tasks assessed                                       General Comments       Exercises     Shoulder Instructions      Home Living Family/patient expects to be discharged to:: Private residence Living Arrangements: Children Available Help at Discharge: Family;Available PRN/intermittently Type of Home: House Home Access: Stairs to enter [employer] of Steps: 4 Entrance Stairs-Rails: Left Home Layout: One level     Bathroom Shower/Tub: Retail Banker: Standard Bathroom Accessibility: Yes   Home Equipment: Grab bars - tub/shower;Standard Walker;BSC/3in1   Additional Comments: Pt's son lives with her, works at night      Prior Functioning/Environment Prior Level of Function : Independent/Modified Independent                        OT Problem List: Decreased strength;Decreased range of motion;Impaired balance (sitting and/or standing);Decreased activity tolerance      OT Treatment/Interventions:      OT Goals(Current goals can be found in the care plan section) Acute Rehab OT Goals Patient Stated Goal: to return home OT Goal Formulation: With patient/family Time For Goal Achievement: 02/17/23 Potential to Achieve Goals: Good  OT Frequency:      Co-evaluation              AM-PAC OT 6 Clicks Daily Activity     Outcome Measure Help from another person eating meals?: None Help from another person taking care of personal grooming?: A Little Help from another person toileting, which includes using toliet, bedpan, or urinal?: A Little Help from another person bathing (including washing, rinsing, drying)?: A Little Help from another person to put on and taking off regular upper body clothing?: A Little Help from another person to put on and taking off regular lower body clothing?: A Little 6 Click Score: 19   End of Session Equipment Utilized During Treatment: Gait belt;Rolling walker (2 wheels) Nurse Communication: Mobility status  Activity Tolerance: Patient tolerated treatment well Patient left: in chair;with call bell/phone within reach;with nursing/sitter in room;with family/visitor present  OT Visit Diagnosis: Other abnormalities of gait and mobility (R26.89);Muscle weakness (generalized) (M62.81)                Time: 8874-8840 OT Time Calculation (min): 34 min Charges:  OT General Charges $OT Visit: 1 Visit OT Evaluation $OT Eval Low Complexity: 1 Low OT Treatments $Self  Care/Home Management : 8-22 mins  [address], OTR/L   Elouise JONELLE Bott 02/03/2023, 12:09 PM

## 2023-02-03 NOTE — Evaluation (Signed)
 Physical Therapy Evaluation Patient Details Name: Kimberly Mccormick MRN: [id number] DOB: May 04, 1950 Today's Date: 02/03/2023  History of Present Illness  73 y/o female presenting with left distal tibia and fibula fracture s/p ORIF on 1/12. PMH includes breast cancer hx, HSV, GERD, HTN, and Parkinson's disease.  Clinical Impression  Patient lives independently at home with her son and was frequently attending OPPT for her Parkinson's, before her ORIF procedure. Patient is currently CGA +1 with ambulation using a RW and requires rest breaks. The patient demonstrated good technique with bed mobility and transfers (STS and chair) independently, using UE and RLE to support. Patient demonstrated good understanding and technique with w/c mobility, utilizing bilateral UE support needed for propulsion. The patient is aware and compliant with her NWB status. During the assessment, the patient was not experiencing any pain and is willing to  participate in therapy. Patient would benefit from continued rehabilitation to ensure safety and independence for stair negotiation and RW mobility.       If plan is discharge home, recommend the following: Help with stairs or ramp for entrance   Can travel by private vehicle        Equipment Recommendations Rolling walker (2 wheels);BSC/3in1;Wheelchair (measurements PT)  Recommendations for Other Services       Functional Status Assessment Patient has had a recent decline in their functional status and demonstrates the ability to make significant improvements in function in a reasonable and predictable amount of time.     Precautions / Restrictions Precautions Precautions: Fall Restrictions Weight Bearing Restrictions Per Provider Order: Yes LLE Weight Bearing Per Provider Order: Non weight bearing      Mobility  Bed Mobility Overal bed mobility: Modified Independent             General bed mobility comments: Patient demonstrated good technique  transferring to EOB, uses UE to her advantage while also maintaining NWB LLE precautions    Transfers Overall transfer level: Modified independent Equipment used: Rolling walker (2 wheels)                    Ambulation/Gait Ambulation/Gait assistance: Contact guard assist Gait Distance (Feet): 5 Feet Assistive device: Rolling walker (2 wheels) Gait Pattern/deviations: Step-to pattern   Gait velocity interpretation: <1.31 ft/sec, indicative of household ambulator   General Gait Details: Patient was able to ambulate independently using a RW in her room  Administrator mobility: Yes Wheelchair propulsion: Both upper extremities Wheelchair parts: Needs assistance (MIN A) Distance: 68 Wheelchair Assistance Details (indicate cue type and reason): Propelled W/C in the hallway with good technique and hand placement, did not require any rest breaks   Tilt Bed    Modified Rankin (Stroke Patients Only)       Balance Overall balance assessment: Mild deficits observed, not formally tested                                           Pertinent Vitals/Pain Pain Assessment Pain Assessment: No/denies pain    Home Living Family/patient expects to be discharged to:: Private residence Living Arrangements: Children Available Help at Discharge: Family Type of Home: House Home Access: Stairs to enter Entrance Stairs-Rails: Left Entrance Stairs-Number of Steps: 4   Home Layout: One level Home Equipment: Grab bars - tub/shower;Standard Environmental Consultant Additional Comments:  Reports approximately 3 steps towards the back of the house with one railing on the right    Prior Function Prior Level of Function : Independent/Modified Independent             Mobility Comments: Was able to attend PT sessions for Parkinson's prior to injury ADLs Comments: Reports being independent and ambulatory      Extremity/Trunk Assessment   Upper Extremity Assessment Upper Extremity Assessment: Overall WFL for tasks assessed    Lower Extremity Assessment Lower Extremity Assessment: LLE deficits/detail LLE Deficits / Details: Increased edema distally, able to lift LLE against gravity LLE:  (Unable to fully assess due to weightbearing status) LLE Sensation: WNL    Cervical / Trunk Assessment Cervical / Trunk Assessment: Normal  Communication   Communication Communication: Hearing impairment Cueing Techniques: Verbal cues  Cognition Arousal: Alert Behavior During Therapy: WFL for tasks assessed/performed Overall Cognitive Status: Within Functional Limits for tasks assessed                                          General Comments      Exercises     Assessment/Plan    PT Assessment Patient needs continued PT services  PT Problem List Decreased mobility;Decreased balance;Decreased strength       PT Treatment Interventions DME instruction;Gait training;Stair training;Therapeutic activities;Therapeutic exercise;Balance training;Neuromuscular re-education;Wheelchair mobility training;Functional mobility training    PT Goals (Current goals can be found in the Care Plan section)  Acute Rehab PT Goals PT Goal Formulation: With patient Time For Goal Achievement: 02/17/23 Potential to Achieve Goals: Good    Frequency Min 1X/week     Co-evaluation               AM-PAC PT 6 Clicks Mobility  Outcome Measure Help needed turning from your back to your side while in a flat bed without using bedrails?: None Help needed moving from lying on your back to sitting on the side of a flat bed without using bedrails?: None Help needed moving to and from a bed to a chair (including a wheelchair)?: None Help needed standing up from a chair using your arms (e.g., wheelchair or bedside chair)?: None Help needed to walk in hospital room?: A Little Help needed climbing  3-5 steps with a railing? : A Little 6 Click Score: 22    End of Session Equipment Utilized During Treatment: Gait belt Activity Tolerance: Patient tolerated treatment well;Patient limited by fatigue (Fatigue with RW mobility) Patient left: in chair;with nursing/sitter in room Nurse Communication: Mobility status PT Visit Diagnosis: Muscle weakness (generalized) (M62.81);Difficulty in walking, not elsewhere classified (R26.2);Other abnormalities of gait and mobility (R26.89)    Time: 9184-9156 PT Time Calculation (min) (ACUTE ONLY): 28 min   Charges:   PT Evaluation $PT Eval Low Complexity: 1 Low PT Treatments $Therapeutic Activity: 8-22 mins PT General Charges $$ ACUTE PT VISIT: 1 Visit         Deanie DOROTHA Harms, SPT  Ricardo Kayes 02/03/2023, 10:25 AM

## 2023-02-03 NOTE — Progress Notes (Signed)
 Transition of Care [facility]) - Inpatient Brief Assessment   Patient Details  Name: Kimberly Mccormick MRN: [id number] Date of Birth: 1950-01-30  Transition of Care [facility]) CM/SW Contact:    Robynn Eileen Hoose, RN Phone Number: 02/03/2023, 3:24 PM   Clinical Narrative:  Patient from home, lives with her son and has a grandson nearby in the area. Patient had ORIF of left leg done on 02/02/2023. PT recommendations in [facility] PT and rolling walker needed. Will need orders for [facility] and DME. TOC to continue to follow for discharge needs.  Transition of Care Asessment: Insurance and Status: (P) Insurance coverage has been reviewed Patient has primary care physician: (P) Yes Home environment has been reviewed: (P) Home with Son Prior level of function:: (P) Independent Prior/Current Home Services: (P) No current home services Social Drivers of Health Review: (P) SDOH reviewed no interventions necessary Readmission risk has been reviewed: (P) Yes Transition of care needs: (P) transition of care needs identified, TOC will continue to follow (RW needed, HHPT need possible)

## 2023-02-04 ENCOUNTER — Ambulatory Visit (HOSPITAL_COMMUNITY): Payer: 59 | Admitting: Speech Pathology

## 2023-02-04 LAB — BASIC METABOLIC PANEL
Anion gap: 6 (ref 5–15)
BUN: 19 mg/dL (ref 8–23)
CO2: 26 mmol/L (ref 22–32)
Calcium: 8.6 mg/dL — ABNORMAL LOW (ref 8.9–10.3)
Chloride: 108 mmol/L (ref 98–111)
Creatinine, Ser: 0.9 mg/dL (ref 0.44–1.00)
GFR, Estimated: >60 mL/min (ref >60)
Glucose, Bld: 111 mg/dL — ABNORMAL HIGH (ref 70–99)
Potassium: 4.2 mmol/L (ref 3.5–5.1)
Sodium: 140 mmol/L (ref 135–145)

## 2023-02-04 MED ORDER — POLYETHYLENE GLYCOL 3350 17 G PO PACK: 17.0000 g | PACK | Freq: Every day | ORAL | 0 refills | Status: AC

## 2023-02-04 MED ORDER — ASPIRIN 81 MG PO CHEW: 81.0000 mg | CHEWABLE_TABLET | Freq: Two times a day (BID) | ORAL | 0 refills | Status: AC

## 2023-02-04 MED ORDER — ACETAMINOPHEN 500 MG PO TABS: 1000.0000 mg | ORAL_TABLET | Freq: Three times a day (TID) | ORAL | 0 refills | Status: AC

## 2023-02-04 MED ORDER — SENNOSIDES-DOCUSATE SODIUM 8.6-50 MG PO TABS
1.0000 | ORAL_TABLET | Freq: Two times a day (BID) | ORAL | Status: DC
  Administered 2023-02-04 – 2023-02-05 (×3): 1 via ORAL
  Filled 2023-02-04 (×3): qty 1

## 2023-02-04 MED ORDER — POLYETHYLENE GLYCOL 3350 17 G PO PACK
17.0000 g | PACK | Freq: Every day | ORAL | Status: DC
  Administered 2023-02-04 – 2023-02-05 (×2): 17 g via ORAL
  Filled 2023-02-04 (×2): qty 1

## 2023-02-04 MED ORDER — OXYCODONE HCL 5 MG PO TABS: 2.5000 mg | ORAL_TABLET | ORAL | 0 refills | Status: AC | PRN

## 2023-02-04 MED ORDER — SENNA 8.6 MG PO TABS: 1.0000 | ORAL_TABLET | Freq: Two times a day (BID) | ORAL | 0 refills | Status: AC

## 2023-02-04 NOTE — Plan of Care (Signed)

## 2023-02-04 NOTE — Progress Notes (Signed)
    Durable Medical Equipment  (From admission, onward)           Start     Ordered   02/04/23 0802  For home use only DME Other see comment  Once       Comments: Tub/ shower seat  Question:  Length of Need  Answer:  12 Months   02/04/23 0802   02/04/23 0801  For home use only DME Walker rolling  Once       Question Answer Comment  Walker: With 5 Inch Wheels   Patient needs a walker to treat with the following condition Gait instability      02/04/23 0801   02/04/23 0801  For home use only DME Bedside commode  Once       Comments: Pt confine to one room  Question:  Patient needs a bedside commode to treat with the following condition  Answer:  Gait instability   02/04/23 0801

## 2023-02-04 NOTE — Discharge Summary (Signed)
 Orthopedic Surgery Discharge Summary  Patient name: Kimberly Mccormick Patient MRN: [id number] Admit date: 02/02/2023 Discharge date: 02/04/2022  Attending physician: Colette Davies, MD Final diagnosis: left distal tibia and fibula fractures Findings: displaced, comminuted distal tibia and fibula fractures   Hospital course: Patient is a 73 y.o. female who was sustained a left distal tibia and fibula fracture after a fall down a hill due to slipping on ice. The patient was admitted to the orthopedic service for planned operative intervention. The patient underwent left tibia and fibula fracture open reduction internal fixation on 02/02/2023. The patient had significant pain immediately after surgery, but pain eventually was controlled with a multimodal regimen including oxycodone . Labs during the hospitalization revealed hypokalemia with a potassium of 3.0. This was correct with oral supplementation and her potassium was 4.2 on recheck the next day. The patient worked with physical therapy who recommended discharge to home. The patient was tolerating an oral diet without issue and was voiding spontaneously after surgery. The patient's vitals were stable on the day of discharge. The patient was medically ready for discharge and was discharge to home on post-operative day three.  Instructions:   Orthopedic Surgery Discharge Instructions  Patient name: Kimberly Mccormick Fracture: left tibia and fibula fracture Procedure Performed: left tibia and fibula fracture open reduction internal fixation Date of Surgery: 02/02/2023 Surgeon: Colette Davies, MD  Activity: You should not put any weight on your left lower extremity. The plates and screws are not designed to place weight through them. Putting weight through the leg can cause complications and result in further surgery. Swelling occurs around the ankle after this kind of surgery. In order to help with healing, reduce swelling, and decrease pain, please elevate  your leg frequently. If you can, elevating your leg above the level of your heart is the best to reduce swelling and you should try to sleep with it in that position.   Incision Care: Your incision site has a dressing over it. That dressing should remain in place and dry at all times for a total of one week after surgery. After one week, you can remove the dressing. Underneath the dressing, you will find sutures. You should leave these sutures in place. They will be taken out in the office when the wound has healed. Do not pick, rub, or scrub at them. Do not put cream or lotion over the surgical area. After one week and once the dressing is off, it is okay to let soap and water  run over your incision. Again, do not pick, scrub, or rub at the sutures when bathing. Do not submerge (e.g., take a bath, swim, go in a hot tub, etc.) until six weeks after surgery. There may be some bloody drainage from the incision into the dressing after surgery. This is normal. You do not need to replace the dressing. Continue to leave it in place for the one week as instructed above. Should the dressing become saturated with blood or drainage, please call the office for further instructions.   Medications: You have been prescribed oxycodone . This is a narcotic pain medication and should only be taken as prescribed. You should not drink alcohol or operate heavy machinery (including driving) while taking this medication. The oxycodone  can cause constipation as a side effect. For that reason, you have been prescribed senna and miralax . These are both laxatives. You do not need to take this medication if you develop diarrhea. Should you remain constipated even while taking the senna and  miralax , please use the miralax  twice daily. Tylenol  has been prescribed to be taken every 8 hours, which will give you additional pain relief. Do not take your home ambien  with oxycodone . If you want to take the ambien , then you need to take your last  dose of oxycodone  4 hours before taking the ambien .   You have been prescribed aspirin  as a blood thinner. This medication is to be taken to prevent blood clots. Take 81 milligrams twice daily. You should refrain from using other blood thinners (warfarin, apixaban, plavix, xarelto, etc.) while using the aspirin . You will need to take this medication for a total of 6 weeks after your surgery.   You should not use over-the-counter NSAIDs (ibuprofen , Aleve , Celebrex , naproxen , meloxicam, etc.) for pain relief because aspirin  is a similar medication. There can be side effects including but not limited to kidney injury and ulcers if you take these type of medications with the aspirin .  In order to set expectations for opioid prescriptions, you will only be prescribed opioids for a total of six weeks after surgery and, at two-weeks after surgery, your opioid prescription will start to tapered (decreased dosage and number of pills). If you have ongoing need for opioid medication six weeks after surgery, you will be referred to pain management. If you are already established with a provider that is giving you opioid medications, you should schedule an appointment with them for six weeks after surgery if you feel you are going to need another prescription. State law only allows for opioid prescriptions one week at a time. If you are running out of opioid medication near the end of the week, please call the office during business hours before running out so I can send you another prescription.   Diet: You are safe to resume your regular diet after surgery.   Reasons to Call the Office After Surgery: You should feel free to call the office with any concerns or questions you have in the post-operative period, but you should definitely notify the office if you develop: -shortness of breath, chest pain, or trouble breathing -excessive bleeding, drainage, redness, or swelling around the surgical site -fevers, chills,  or pain that is getting worse with each passing day -persistent nausea or vomiting -new weakness in the left leg, new or worsening numbness or tingling in the left leg -other concerns about your surgery  Follow Up Appointments: You have a follow up appointment scheduled with Dr. Sulema Endo on 02/20/2023 at 1:45pm. Please arrive on time to this appointment. The office location and phone number are listed below.   Office Information:  -Colette Davies, MD -Phone number: [phone] -Address: [address]       [place], [place] [address]

## 2023-02-04 NOTE — Progress Notes (Signed)
    Durable Medical Equipment  (From admission, onward)           Start     Ordered   02/04/23 1414  For home use only DME lightweight manual wheelchair with seat cushion  Once       Comments: Patient suffers from  left tibia and fibula fractures  which impairs their ability to perform daily activities like bathing in the home.  A walker will not resolve  issue with performing activities of daily living. A wheelchair will allow patient to safely perform daily activities. Patient is not able to propel themselves in the home using a standard weight wheelchair due to general weakness. Patient can self propel in the lightweight wheelchair. Length of need 12 months . Accessories: elevating leg rests (ELRs), wheel locks, extensions and anti-tippers.   02/04/23 1414   02/04/23 0802  For home use only DME Other see comment  Once       Comments: Tub/ shower seat  Question:  Length of Need  Answer:  12 Months   02/04/23 0802   02/04/23 0801  For home use only DME Walker rolling  Once       Question Answer Comment  Walker: With 5 Inch Wheels   Patient needs a walker to treat with the following condition Gait instability      02/04/23 0801   02/04/23 0801  For home use only DME Bedside commode  Once       Comments: Pt confine to one room  Question:  Patient needs a bedside commode to treat with the following condition  Answer:  Gait instability   02/04/23 0801

## 2023-02-04 NOTE — Progress Notes (Signed)
 Orthopedic Surgery Progress Note   Assessment: Patient is a 73 y.o. female with left tibia and fibula fractures status post ORIF   Plan: -Operative plans: complete -Diet: regular -DVT ppx: aspirin  81mg  BID -Weight bearing status: NWB LLE, okay for active range of motion at the ankle -PT/OT evaluate and treat -Pain control -Anticipate discharge to home tomorrow  ___________________________________________________________________________  Subjective: No acute events overnight. Felt PT went well yesterday. Says she is having minimal pain in the ankle even without pain medication. Denies paresthesias and numbness.    Physical Exam:  General: no acute distress, appears stated age, laying in bed Neurologic: alert, answering questions appropriately, following commands Respiratory: unlabored breathing on room air  MSK:   -Left lower extremity  Dressings c/d/i EHL/TA/GSC intact Plantarflexes and dorsiflexes toes Sensation intact to light touch in sural, saphenous, tibial, deep peroneal, and superficial peroneal nerve distributions Foot warm and well perfused, palpable DP pulse   Patient name: Kimberly Mccormick Patient MRN: [id number] Date: 02/04/23

## 2023-02-04 NOTE — Progress Notes (Signed)
 Physical Therapy Treatment Patient Details Name: Kimberly Mccormick MRN: [id number] DOB: 11/25/1950 Today's Date: 02/04/2023   History of Present Illness 73 y/o female presenting with left distal tibia and fibula fracture s/p ORIF on 1/12. PMH includes breast cancer hx, HSV, GERD, HTN, and Parkinson's disease.    PT Comments  Patient is progressing toward PT goals and tolerated well to treatment today. The patient continues to demonstrate good technique with bed mobility and transfers (sit > stand) independently. The patient is compliant with her weightbearing precautions and uses UE and RLE support for all transfers. Continued understanding and technique  with W/C mobility, utilizing bilateral UE support needed for propulsion. Patient used the RW and performed a step-to pattern when ambulating from her room into the hallway. Introduced museum/gallery curator in order to improve stair negotiation at home. Patient prefers family member assist for bumping up steps when seated in the w/c. Recommend follow up with HHPT to maximize functional level of independence and stair negotiation.    If plan is discharge home, recommend the following: Help with stairs or ramp for entrance   Can travel by private vehicle        Equipment Recommendations  Rolling walker (2 wheels);BSC/3in1;Wheelchair (measurements PT)    Recommendations for Other Services       Precautions / Restrictions Precautions Precautions: Fall Restrictions Weight Bearing Restrictions Per Provider Order: Yes LLE Weight Bearing Per Provider Order: Non weight bearing     Mobility  Bed Mobility Overal bed mobility: Independent             General bed mobility comments: Patient demonstrated good technique transferring to EOB, uses UE to her advantage while also maintaining NWB LLE precautions    Transfers Overall transfer level: Modified independent Equipment used: Rolling walker (2 wheels) Transfers: Sit to/from Stand, Bed to  chair/wheelchair/BSC Sit to Stand: Modified independent (Device/Increase time) Stand pivot transfers:  (using a walker, independent)         General transfer comment: Patient was able to take the neccessary steps from w/c to bed with RW    Ambulation/Gait Ambulation/Gait assistance: Contact guard assist Gait Distance (Feet): 20 Feet Assistive device: Rolling walker (2 wheels) Gait Pattern/deviations: Step-to pattern       General Gait Details: Patient was able to ambulate independently using a RW in her room while maintain weightbearing precuations   Stairs Stairs: Yes Stairs assistance: Min assist Stair Management: One rail Left, Sideways, Seated/boosting Number of Stairs: 3 General stair comments: Patient was educated on stair negotiation using a shower chair and utilization of an extra helper to help lift w/c when ascending/descending steps   Wheelchair Mobility Wheelchair Mobility Wheelchair mobility: Yes Wheelchair propulsion: Both upper extremities, Right lower extremity Wheelchair parts: Independent Distance: 176 Wheelchair Assistance Details (indicate cue type and reason): Patient self-propelled w/c in her room and within the unit without difficulty   Tilt Bed    Modified Rankin (Stroke Patients Only)       Balance Overall balance assessment: Mild deficits observed, not formally tested                                          Cognition Arousal: Alert Behavior During Therapy: WFL for tasks assessed/performed Overall Cognitive Status: Within Functional Limits for tasks assessed  Exercises      General Comments        Pertinent Vitals/Pain Pain Assessment Pain Assessment: Faces Faces Pain Scale: Hurts little more Pain Location: LLE    Home Living                          Prior Function            PT Goals (current goals can now be found in the care plan  section) Acute Rehab PT Goals PT Goal Formulation: With patient Time For Goal Achievement: 02/17/23 Potential to Achieve Goals: Good Progress towards PT goals: Progressing toward goals    Frequency    Min 1X/week      PT Plan      Co-evaluation              AM-PAC PT 6 Clicks Mobility   Outcome Measure  Help needed turning from your back to your side while in a flat bed without using bedrails?: None Help needed moving from lying on your back to sitting on the side of a flat bed without using bedrails?: None Help needed moving to and from a bed to a chair (including a wheelchair)?: None Help needed standing up from a chair using your arms (e.g., wheelchair or bedside chair)?: None Help needed to walk in hospital room?: A Little Help needed climbing 3-5 steps with a railing? : None 6 Click Score: 23    End of Session Equipment Utilized During Treatment: Gait belt Activity Tolerance: Patient tolerated treatment well Patient left: in bed;with call bell/phone within reach   PT Visit Diagnosis: Muscle weakness (generalized) (M62.81);Difficulty in walking, not elsewhere classified (R26.2);Other abnormalities of gait and mobility (R26.89)     Time: 8653-8581 PT Time Calculation (min) (ACUTE ONLY): 32 min  Charges:    $Gait Training: 8-22 mins PT General Charges $$ ACUTE PT VISIT: 1 Visit                     Kimberly Mccormick, SPT   Kimberly Mccormick 02/04/2023, 4:35 PM

## 2023-02-05 DIAGNOSIS — K30 Functional dyspepsia: Secondary | ICD-10-CM | POA: Diagnosis not present

## 2023-02-05 DIAGNOSIS — S82202A Unspecified fracture of shaft of left tibia, initial encounter for closed fracture: Secondary | ICD-10-CM | POA: Diagnosis not present

## 2023-02-05 DIAGNOSIS — D132 Benign neoplasm of duodenum: Secondary | ICD-10-CM | POA: Diagnosis not present

## 2023-02-05 NOTE — Progress Notes (Signed)
 Orthopedic Surgery Progress Note   Assessment: Patient is a 73 y.o. female with left tibia and fibula fractures status post ORIF   Plan: -Operative plans: complete -Diet: regular -DVT ppx: aspirin  81mg  BID -Weight bearing status: NWB LLE, okay for active range of motion at the ankle -PT/OT evaluate and treat -Pain control -Anticipate discharge to home today  ___________________________________________________________________________  Subjective: No acute events overnight. Pain well controlled. Wants to go home today. No complaints this morning.    Physical Exam:  General: no acute distress, appears stated age, laying in bed Neurologic: alert, answering questions appropriately, following commands Respiratory: unlabored breathing on room air  MSK:   -Left lower extremity  Dressings c/d/i EHL/TA/GSC intact Plantarflexes and dorsiflexes toes Sensation intact to light touch in sural, saphenous, tibial, deep peroneal, and superficial peroneal nerve distributions Foot warm and well perfused, palpable DP pulse   Patient name: Kimberly Mccormick Patient MRN: [id number] Date: 02/05/23

## 2023-02-05 NOTE — Plan of Care (Signed)

## 2023-02-05 NOTE — TOC Transition Note (Addendum)
 Transition of Care [facility]) - Discharge Note   Patient Details  Name: Kimberly Mccormick MRN: [id number] Date of Birth: 29-Nov-1950  Transition of Care [facility]) CM/SW Contact:  Alisa App, RN Phone Number: 02/05/2023, 8:26 AM   Clinical Narrative:    Patient will DC to: home Anticipated DC date: 02/05/2023 Family notified: yes Transport by:car      -  left tibia and fibula fractures status post ORIF   Per MD patient ready for DC today. RN, patient, and patient's family aware of DC.   Pt agreeable to home health PT services per PT's recommendations pending MD's orders. Pt without provider preference. Referral made with [facility] and accepted. Referral made with [employer] for DME : RW, W/C , tub/shower stool and BSC. Equipment will be delivered to bedside prior to d/c except for RW. Pt has RW @ home. Pt without RX meds. Pt to pick up meds from local pharmacy. Pt with transportation to home.  Post hospital f/u noted on AVS.  02/04/2022 @ 11:22am  DME equipment ( BSC, W/C) delivered to pt's bedside by [facility] to f/u with tub/shower stool order and deliver equipment to pt's home.  RNCM will sign off for now as intervention is no longer needed. Please consult us  again if new needs arise.     Barriers to Discharge: No Barriers Identified   Patient Goals and CMS Choice     Choice offered to / list presented to : Patient      Discharge Placement                       Discharge Plan and Services Additional resources added to the After Visit Summary for                  DME Arranged: Bedside commode, Lightweight manual wheelchair with seat cushion, Tub bench, Walker rolling   Date DME Agency Contacted: 02/05/23 Time DME Agency Contacted: [phone] Representative spoke with at DME Agency: Gladys Lamp HH Arranged: PT HH Agency: [facility] Date [facility] Agency Contacted: 02/05/23 Time HH Agency Contacted: [phone] Representative spoke with at [facility] Agency:  Loetta Ringer  Social Drivers of Health (SDOH) Interventions SDOH Screenings   Food Insecurity: No Food Insecurity (02/02/2023)  Housing: Low Risk  (02/02/2023)  Transportation Needs: No Transportation Needs (02/02/2023)  Utilities: Not At Risk (02/02/2023)  Alcohol Screen: Low Risk  (01/08/2021)  Depression (PHQ2-9): Low Risk  (01/08/2021)  Financial Resource Strain: Low Risk  (01/08/2021)  Physical Activity: Inactive (01/08/2021)  Social Connections: Moderately Integrated (02/02/2023)  Stress: No Stress Concern Present (01/08/2021)  Tobacco Use: Low Risk  (02/02/2023)     Readmission Risk Interventions     No data to display

## 2023-02-05 NOTE — Plan of Care (Signed)

## 2023-02-05 NOTE — Progress Notes (Signed)
 AVS reviewed.  IV removed.  All questions answered.  WC and BSC delivered to room.  Tub bench to be delivered home.

## 2023-02-05 NOTE — Progress Notes (Signed)
 Physical Therapy Treatment Patient Details Name: Kimberly Mccormick MRN: [id number] DOB: July 15, 1950 Today's Date: 02/05/2023   History of Present Illness 73 y/o female presenting with left distal tibia and fibula fracture s/p ORIF on 1/12. PMH includes breast cancer hx, HSV, GERD, HTN, and Parkinson's disease.    PT Comments  Pt made good progress towards her physical therapy goals during her inpatient stay. Hopping 40 ft with a walker, utilizing good adherence to weightbearing precautions. Reviewed gastroc stretch with strap and ankle ROM. Recommend HHPT at d/c.    If plan is discharge home, recommend the following: Help with stairs or ramp for entrance   Can travel by private vehicle        Equipment Recommendations  Rolling walker (2 wheels);BSC/3in1;Wheelchair (measurements PT)    Recommendations for Other Services       Precautions / Restrictions Precautions Precautions: Fall Restrictions Weight Bearing Restrictions Per Provider Order: Yes LLE Weight Bearing Per Provider Order: Non weight bearing     Mobility  Bed Mobility Overal bed mobility: Modified Independent                  Transfers Overall transfer level: Modified independent Equipment used: Rolling walker (2 wheels) Transfers: Sit to/from Stand                  Ambulation/Gait Ambulation/Gait assistance: Contact guard assist Gait Distance (Feet): 40 Feet Assistive device: Rolling walker (2 wheels) Gait Pattern/deviations: Step-to pattern       General Gait Details: Hop to pattern   Stairs   Stairs assistance: Min assist Stair Management: One rail Left, Sideways, Seated/boosting   General stair comments: Patient was educated on stair negotiation using a shower chair and utilization of an extra helper to help lift w/c when ascending/descending steps   Corporate treasurer Wheelchair propulsion: Both upper extremities, Right lower extremity Wheelchair Assistance  Details (indicate cue type and reason): Patient self-propelled w/c in her room and within the unit without difficulty   Tilt Bed    Modified Rankin (Stroke Patients Only)       Balance Overall balance assessment: Mild deficits observed, not formally tested                                          Cognition Arousal: Alert Behavior During Therapy: WFL for tasks assessed/performed Overall Cognitive Status: Within Functional Limits for tasks assessed                                          Exercises Other Exercises Other Exercises: Supine: L gastroc stretch with strap    General Comments        Pertinent Vitals/Pain Pain Assessment Pain Assessment: Faces Faces Pain Scale: Hurts a little bit Pain Location: LLE Pain Descriptors / Indicators: Operative site guarding, Grimacing Pain Intervention(s): Monitored during session    Home Living                          Prior Function            PT Goals (current goals can now be found in the care plan section) Acute Rehab PT Goals PT Goal Formulation: With patient Time For Goal Achievement: 02/17/23 Potential to Achieve Goals: Good  Progress towards PT goals: Progressing toward goals    Frequency    Min 1X/week      PT Plan      Co-evaluation              AM-PAC PT "6 Clicks" Mobility   Outcome Measure  Help needed turning from your back to your side while in a flat bed without using bedrails?: None Help needed moving from lying on your back to sitting on the side of a flat bed without using bedrails?: None Help needed moving to and from a bed to a chair (including a wheelchair)?: None Help needed standing up from a chair using your arms (e.g., wheelchair or bedside chair)?: None Help needed to walk in hospital room?: A Little Help needed climbing 3-5 steps with a railing? : None 6 Click Score: 23    End of Session Equipment Utilized During Treatment: Gait  belt Activity Tolerance: Patient tolerated treatment well Patient left: in bed;with call bell/phone within reach   PT Visit Diagnosis: Muscle weakness (generalized) (M62.81);Difficulty in walking, not elsewhere classified (R26.2);Other abnormalities of gait and mobility (R26.89)     Time: 1610-9604 PT Time Calculation (min) (ACUTE ONLY): 31 min  Charges:    $Therapeutic Activity: 23-37 mins PT General Charges $$ ACUTE PT VISIT: 1 Visit                     Verdia Glad, PT, DPT Acute Rehabilitation Services Office [phone]    Claria Crofts 02/05/2023, 4:00 PM

## 2023-02-05 NOTE — Care Management Important Message (Signed)
 Important Message  Patient Details  Name: SAORY VERONA MRN: [id number] Date of Birth: 10-02-1950   Important Message Given:      Patient left prior to IM delivery will mail a copy to the patient home address  Wynonia Hedges 02/05/2023, 1:45 PM

## 2023-02-06 ENCOUNTER — Encounter (HOSPITAL_COMMUNITY): Payer: 59 | Admitting: Occupational Therapy

## 2023-02-06 ENCOUNTER — Telehealth: Payer: Self-pay | Admitting: Orthopedic Surgery

## 2023-02-06 ENCOUNTER — Encounter (HOSPITAL_COMMUNITY): Payer: 59

## 2023-02-06 DIAGNOSIS — Z7982 Long term (current) use of aspirin: Secondary | ICD-10-CM | POA: Diagnosis not present

## 2023-02-06 DIAGNOSIS — I10 Essential (primary) hypertension: Secondary | ICD-10-CM | POA: Diagnosis not present

## 2023-02-06 DIAGNOSIS — G20A1 Parkinson's disease without dyskinesia, without mention of fluctuations: Secondary | ICD-10-CM | POA: Diagnosis not present

## 2023-02-06 DIAGNOSIS — Z9181 History of falling: Secondary | ICD-10-CM | POA: Diagnosis not present

## 2023-02-06 DIAGNOSIS — I671 Cerebral aneurysm, nonruptured: Secondary | ICD-10-CM | POA: Diagnosis not present

## 2023-02-06 DIAGNOSIS — S82452D Displaced comminuted fracture of shaft of left fibula, subsequent encounter for closed fracture with routine healing: Secondary | ICD-10-CM | POA: Diagnosis not present

## 2023-02-06 DIAGNOSIS — Z9049 Acquired absence of other specified parts of digestive tract: Secondary | ICD-10-CM | POA: Diagnosis not present

## 2023-02-06 DIAGNOSIS — S82252D Displaced comminuted fracture of shaft of left tibia, subsequent encounter for closed fracture with routine healing: Secondary | ICD-10-CM | POA: Diagnosis not present

## 2023-02-06 DIAGNOSIS — Z853 Personal history of malignant neoplasm of breast: Secondary | ICD-10-CM | POA: Diagnosis not present

## 2023-02-06 DIAGNOSIS — Z8673 Personal history of transient ischemic attack (TIA), and cerebral infarction without residual deficits: Secondary | ICD-10-CM | POA: Diagnosis not present

## 2023-02-06 DIAGNOSIS — K219 Gastro-esophageal reflux disease without esophagitis: Secondary | ICD-10-CM | POA: Diagnosis not present

## 2023-02-06 NOTE — Telephone Encounter (Signed)
Stacy (PT) from [facility] called requesting verbal orders for 1 wk 9. Stay secure number is [phone].

## 2023-02-07 NOTE — Telephone Encounter (Signed)
I called and gave verbal auth for 1 wk 9

## 2023-02-10 ENCOUNTER — Encounter (HOSPITAL_COMMUNITY): Payer: 59

## 2023-02-10 ENCOUNTER — Telehealth: Payer: Self-pay | Admitting: *Deleted

## 2023-02-10 NOTE — Telephone Encounter (Signed)
I called and advised that she can take off dressing, and she needs to elevate her leg. [place] will let the patient know.

## 2023-02-10 NOTE — Telephone Encounter (Signed)
Received call from [place] with [facility] stating pt called wanting a nurse to come out stating she has swelling with blisters and bandage to tight- orders were only for PT but wants to know if you wanted her to have nurse visit as well. Please advise.   Call back number [phone]

## 2023-02-11 ENCOUNTER — Ambulatory Visit (HOSPITAL_COMMUNITY): Payer: 59 | Admitting: Speech Pathology

## 2023-02-12 ENCOUNTER — Ambulatory Visit (INDEPENDENT_AMBULATORY_CARE_PROVIDER_SITE_OTHER): Payer: 59 | Admitting: Orthopedic Surgery

## 2023-02-12 DIAGNOSIS — G20A1 Parkinson's disease without dyskinesia, without mention of fluctuations: Secondary | ICD-10-CM | POA: Diagnosis not present

## 2023-02-12 DIAGNOSIS — Z853 Personal history of malignant neoplasm of breast: Secondary | ICD-10-CM | POA: Diagnosis not present

## 2023-02-12 DIAGNOSIS — K219 Gastro-esophageal reflux disease without esophagitis: Secondary | ICD-10-CM | POA: Diagnosis not present

## 2023-02-12 DIAGNOSIS — S82252D Displaced comminuted fracture of shaft of left tibia, subsequent encounter for closed fracture with routine healing: Secondary | ICD-10-CM | POA: Diagnosis not present

## 2023-02-12 DIAGNOSIS — S82452D Displaced comminuted fracture of shaft of left fibula, subsequent encounter for closed fracture with routine healing: Secondary | ICD-10-CM | POA: Diagnosis not present

## 2023-02-12 DIAGNOSIS — Z7982 Long term (current) use of aspirin: Secondary | ICD-10-CM | POA: Diagnosis not present

## 2023-02-12 DIAGNOSIS — Z9181 History of falling: Secondary | ICD-10-CM | POA: Diagnosis not present

## 2023-02-12 DIAGNOSIS — S82202D Unspecified fracture of shaft of left tibia, subsequent encounter for closed fracture with routine healing: Secondary | ICD-10-CM

## 2023-02-12 DIAGNOSIS — I10 Essential (primary) hypertension: Secondary | ICD-10-CM | POA: Diagnosis not present

## 2023-02-12 DIAGNOSIS — S82402D Unspecified fracture of shaft of left fibula, subsequent encounter for closed fracture with routine healing: Secondary | ICD-10-CM

## 2023-02-12 DIAGNOSIS — I671 Cerebral aneurysm, nonruptured: Secondary | ICD-10-CM | POA: Diagnosis not present

## 2023-02-12 DIAGNOSIS — Z8673 Personal history of transient ischemic attack (TIA), and cerebral infarction without residual deficits: Secondary | ICD-10-CM | POA: Diagnosis not present

## 2023-02-12 DIAGNOSIS — Z9049 Acquired absence of other specified parts of digestive tract: Secondary | ICD-10-CM | POA: Diagnosis not present

## 2023-02-12 NOTE — Progress Notes (Signed)
Orthopedic Surgery Post-operative Office Visit  Procedure: left distal tibia and fibula fractures open reduction internal fixation Date of Surgery: 02/02/2023 (~10 days post-op)  Assessment: Patient is a 73 y.o. who is doing well after surgery   Plan: -Operative plans complete -Nonweightbearing left lower extremity -Will plan to remove sutures at our next visit -Can leave incisions open to the air and allow soap/water run over the incisions, do not submerge -I explained that the blisters are fracture blisters and will resolve with time.  Encouraged her to continue to elevate the leg regularly -Pain management: weaning oxycodone -Return to office in 1 week, x-rays needed at next visit: left AP and lateral tibia  ___________________________________________________________________________   Subjective: Patient has been doing well since discharge from the hospital.  She has been at home with help from family.  Her pain has been getting better with time.  She has been elevating her leg regularly.  She has not noticed any redness or drainage around her dressings.  She has left her dressings on until now.  The reason she comes in today as she has noticed a blister around the anterior aspect of the distal ankle and 1 along the posterior aspect of the lateral ankle and she was concerned about these blisters.  Objective:  General: no acute distress, appropriate affect Neurologic: alert, answering questions appropriately, following commands Respiratory: unlabored breathing on room air Skin: incisions are well-approximated with nylon sutures in place, no erythema seen, no active/expressible drainage, no induration  MSK (LLE): Ankle range of motion from 0-30, sensation intact to light touch in sural/saphenous/deep peroneal/superficial peroneal/tibial nerve distributions, foot warm and well-perfused, palpable DP pulse, EHL/TA/GSC intact.  There is a fracture blister anterior and distal to the medial  incision with appearance of serous fluid in it.  There is a similar-appearing fracture blister posterior and distal to the lateral incision  Imaging: None obtained at today's visit   Patient name: Kimberly Mccormick Patient MRN: [id number] Date of visit: 02/12/23

## 2023-02-13 ENCOUNTER — Encounter (HOSPITAL_COMMUNITY): Payer: 59 | Admitting: Occupational Therapy

## 2023-02-13 ENCOUNTER — Encounter (HOSPITAL_COMMUNITY): Payer: 59

## 2023-02-17 ENCOUNTER — Ambulatory Visit (HOSPITAL_COMMUNITY): Payer: 59 | Admitting: Speech Pathology

## 2023-02-17 DIAGNOSIS — Z853 Personal history of malignant neoplasm of breast: Secondary | ICD-10-CM | POA: Diagnosis not present

## 2023-02-17 DIAGNOSIS — S82252D Displaced comminuted fracture of shaft of left tibia, subsequent encounter for closed fracture with routine healing: Secondary | ICD-10-CM | POA: Diagnosis not present

## 2023-02-17 DIAGNOSIS — R5383 Other fatigue: Secondary | ICD-10-CM | POA: Diagnosis not present

## 2023-02-17 DIAGNOSIS — S82452D Displaced comminuted fracture of shaft of left fibula, subsequent encounter for closed fracture with routine healing: Secondary | ICD-10-CM | POA: Diagnosis not present

## 2023-02-17 DIAGNOSIS — Z7982 Long term (current) use of aspirin: Secondary | ICD-10-CM | POA: Diagnosis not present

## 2023-02-17 DIAGNOSIS — I671 Cerebral aneurysm, nonruptured: Secondary | ICD-10-CM | POA: Diagnosis not present

## 2023-02-17 DIAGNOSIS — G20A1 Parkinson's disease without dyskinesia, without mention of fluctuations: Secondary | ICD-10-CM | POA: Diagnosis not present

## 2023-02-17 DIAGNOSIS — I10 Essential (primary) hypertension: Secondary | ICD-10-CM | POA: Diagnosis not present

## 2023-02-17 DIAGNOSIS — D649 Anemia, unspecified: Secondary | ICD-10-CM | POA: Diagnosis not present

## 2023-02-17 DIAGNOSIS — Z9049 Acquired absence of other specified parts of digestive tract: Secondary | ICD-10-CM | POA: Diagnosis not present

## 2023-02-17 DIAGNOSIS — Z8673 Personal history of transient ischemic attack (TIA), and cerebral infarction without residual deficits: Secondary | ICD-10-CM | POA: Diagnosis not present

## 2023-02-17 DIAGNOSIS — S82202S Unspecified fracture of shaft of left tibia, sequela: Secondary | ICD-10-CM | POA: Diagnosis not present

## 2023-02-17 DIAGNOSIS — E876 Hypokalemia: Secondary | ICD-10-CM | POA: Diagnosis not present

## 2023-02-17 DIAGNOSIS — Z9181 History of falling: Secondary | ICD-10-CM | POA: Diagnosis not present

## 2023-02-17 DIAGNOSIS — K219 Gastro-esophageal reflux disease without esophagitis: Secondary | ICD-10-CM | POA: Diagnosis not present

## 2023-02-19 ENCOUNTER — Ambulatory Visit (INDEPENDENT_AMBULATORY_CARE_PROVIDER_SITE_OTHER): Payer: 59 | Admitting: Orthopedic Surgery

## 2023-02-19 ENCOUNTER — Other Ambulatory Visit (INDEPENDENT_AMBULATORY_CARE_PROVIDER_SITE_OTHER): Payer: 59

## 2023-02-19 DIAGNOSIS — S82402D Unspecified fracture of shaft of left fibula, subsequent encounter for closed fracture with routine healing: Secondary | ICD-10-CM

## 2023-02-19 DIAGNOSIS — S82202D Unspecified fracture of shaft of left tibia, subsequent encounter for closed fracture with routine healing: Secondary | ICD-10-CM | POA: Diagnosis not present

## 2023-02-19 NOTE — Progress Notes (Signed)
Orthopedic Surgery Post-operative Office Visit   Procedure: left distal tibia and fibula fractures open reduction internal fixation Date of Surgery: 02/02/2023 (~2 weeks post-op)   Assessment: Patient is a 73 y.o. who is doing well after surgery     Plan: -Operative plans complete -Nonweightbearing left lower extremity -Sutures removed at today's appointment -Still has swelling around the ankle, encouraged her to elevate higher than the level of the heart as much as she can -Can leave incisions open to the air and allow soap/water run over the incisions, do not submerge -DVT ppx: ASA 81mg  BID -Pain management: OTC medications -Return to office in 4 week2, x-rays needed at next visit: left AP and lateral tibia   ___________________________________________________________________________     Subjective: Patient's pain continues to improve.  She is no longer taking oxycodone.  She has not noticed any redness or drainage around her incisions.  She has been nonweightbearing.  She has not noticed any new listers around her foot or ankle.  She has not noticed any drainage around those former fracture blister sites.   Objective:   General: no acute distress, appropriate affect Neurologic: alert, answering questions appropriately, following commands Respiratory: unlabored breathing on room air Skin: incisions are well-approximated with nylon sutures in place, no erythema seen, no active/expressible drainage, no induration   MSK (LLE): Ankle range of motion from 0-30, sensation intact to light touch in sural/saphenous/deep peroneal/superficial peroneal/tibial nerve distributions, foot warm and well-perfused, palpable DP pulse, EHL/TA/GSC intact.  Prior fracture blister sites with underlying healthy appearing red tissue.  No new fracture blisters seen around the foot or ankle.    Imaging: XRs of the left tibia from 02/19/2023 were independently reviewed and interpreted, showing medial tibial  fixation with no lucency around the screws.  No screws backed out. The lateral fixation appears in appropriate position with no lucency around those screws. Fractures appear well reduced with slight step off noted on the lateral view for both the tibia and fibula fractures.      Patient name: Kimberly Mccormick Patient MRN: [id number] Date of visit: 02/19/23

## 2023-02-20 ENCOUNTER — Encounter: Payer: 59 | Admitting: Orthopedic Surgery

## 2023-02-20 ENCOUNTER — Ambulatory Visit (HOSPITAL_COMMUNITY): Payer: 59 | Admitting: Speech Pathology

## 2023-02-24 DIAGNOSIS — K219 Gastro-esophageal reflux disease without esophagitis: Secondary | ICD-10-CM | POA: Diagnosis not present

## 2023-02-24 DIAGNOSIS — Z7982 Long term (current) use of aspirin: Secondary | ICD-10-CM | POA: Diagnosis not present

## 2023-02-24 DIAGNOSIS — S82252D Displaced comminuted fracture of shaft of left tibia, subsequent encounter for closed fracture with routine healing: Secondary | ICD-10-CM | POA: Diagnosis not present

## 2023-02-24 DIAGNOSIS — S82452D Displaced comminuted fracture of shaft of left fibula, subsequent encounter for closed fracture with routine healing: Secondary | ICD-10-CM | POA: Diagnosis not present

## 2023-02-24 DIAGNOSIS — Z9049 Acquired absence of other specified parts of digestive tract: Secondary | ICD-10-CM | POA: Diagnosis not present

## 2023-02-24 DIAGNOSIS — I10 Essential (primary) hypertension: Secondary | ICD-10-CM | POA: Diagnosis not present

## 2023-02-24 DIAGNOSIS — G20A1 Parkinson's disease without dyskinesia, without mention of fluctuations: Secondary | ICD-10-CM | POA: Diagnosis not present

## 2023-02-24 DIAGNOSIS — Z853 Personal history of malignant neoplasm of breast: Secondary | ICD-10-CM | POA: Diagnosis not present

## 2023-02-24 DIAGNOSIS — Z8673 Personal history of transient ischemic attack (TIA), and cerebral infarction without residual deficits: Secondary | ICD-10-CM | POA: Diagnosis not present

## 2023-02-24 DIAGNOSIS — Z9181 History of falling: Secondary | ICD-10-CM | POA: Diagnosis not present

## 2023-02-24 DIAGNOSIS — I671 Cerebral aneurysm, nonruptured: Secondary | ICD-10-CM | POA: Diagnosis not present

## 2023-02-25 ENCOUNTER — Encounter (HOSPITAL_COMMUNITY): Payer: 59 | Admitting: Occupational Therapy

## 2023-02-25 ENCOUNTER — Ambulatory Visit (HOSPITAL_COMMUNITY): Payer: 59 | Admitting: Speech Pathology

## 2023-02-27 ENCOUNTER — Ambulatory Visit (HOSPITAL_COMMUNITY): Payer: 59 | Admitting: Speech Pathology

## 2023-02-27 ENCOUNTER — Encounter (HOSPITAL_COMMUNITY): Payer: 59 | Admitting: Occupational Therapy

## 2023-03-04 ENCOUNTER — Ambulatory Visit (HOSPITAL_COMMUNITY): Payer: 59 | Admitting: Speech Pathology

## 2023-03-04 ENCOUNTER — Other Ambulatory Visit: Payer: Self-pay | Admitting: Cardiology

## 2023-03-04 ENCOUNTER — Encounter (HOSPITAL_COMMUNITY): Payer: 59 | Admitting: Occupational Therapy

## 2023-03-05 DIAGNOSIS — K219 Gastro-esophageal reflux disease without esophagitis: Secondary | ICD-10-CM | POA: Diagnosis not present

## 2023-03-05 DIAGNOSIS — Z7982 Long term (current) use of aspirin: Secondary | ICD-10-CM | POA: Diagnosis not present

## 2023-03-05 DIAGNOSIS — I10 Essential (primary) hypertension: Secondary | ICD-10-CM | POA: Diagnosis not present

## 2023-03-05 DIAGNOSIS — Z8673 Personal history of transient ischemic attack (TIA), and cerebral infarction without residual deficits: Secondary | ICD-10-CM | POA: Diagnosis not present

## 2023-03-05 DIAGNOSIS — Z853 Personal history of malignant neoplasm of breast: Secondary | ICD-10-CM | POA: Diagnosis not present

## 2023-03-05 DIAGNOSIS — Z9181 History of falling: Secondary | ICD-10-CM | POA: Diagnosis not present

## 2023-03-05 DIAGNOSIS — S82252D Displaced comminuted fracture of shaft of left tibia, subsequent encounter for closed fracture with routine healing: Secondary | ICD-10-CM | POA: Diagnosis not present

## 2023-03-05 DIAGNOSIS — I671 Cerebral aneurysm, nonruptured: Secondary | ICD-10-CM | POA: Diagnosis not present

## 2023-03-05 DIAGNOSIS — G20A1 Parkinson's disease without dyskinesia, without mention of fluctuations: Secondary | ICD-10-CM | POA: Diagnosis not present

## 2023-03-05 DIAGNOSIS — S82452D Displaced comminuted fracture of shaft of left fibula, subsequent encounter for closed fracture with routine healing: Secondary | ICD-10-CM | POA: Diagnosis not present

## 2023-03-05 DIAGNOSIS — Z9049 Acquired absence of other specified parts of digestive tract: Secondary | ICD-10-CM | POA: Diagnosis not present

## 2023-03-07 DIAGNOSIS — S82202S Unspecified fracture of shaft of left tibia, sequela: Secondary | ICD-10-CM | POA: Diagnosis not present

## 2023-03-07 DIAGNOSIS — I6930 Unspecified sequelae of cerebral infarction: Secondary | ICD-10-CM | POA: Diagnosis not present

## 2023-03-07 DIAGNOSIS — I10 Essential (primary) hypertension: Secondary | ICD-10-CM | POA: Diagnosis not present

## 2023-03-07 DIAGNOSIS — E876 Hypokalemia: Secondary | ICD-10-CM | POA: Diagnosis not present

## 2023-03-10 ENCOUNTER — Ambulatory Visit (INDEPENDENT_AMBULATORY_CARE_PROVIDER_SITE_OTHER): Payer: 59 | Admitting: Orthopedic Surgery

## 2023-03-10 ENCOUNTER — Telehealth: Payer: Self-pay | Admitting: Orthopedic Surgery

## 2023-03-10 ENCOUNTER — Other Ambulatory Visit (INDEPENDENT_AMBULATORY_CARE_PROVIDER_SITE_OTHER): Payer: 59

## 2023-03-10 DIAGNOSIS — S82402D Unspecified fracture of shaft of left fibula, subsequent encounter for closed fracture with routine healing: Secondary | ICD-10-CM

## 2023-03-10 DIAGNOSIS — S82202D Unspecified fracture of shaft of left tibia, subsequent encounter for closed fracture with routine healing: Secondary | ICD-10-CM

## 2023-03-10 MED ORDER — OXYCODONE HCL 5 MG PO TABS: 5.0000 mg | ORAL_TABLET | ORAL | 0 refills | Status: AC | PRN

## 2023-03-10 NOTE — Telephone Encounter (Signed)
Patient called asked if she can get a handicap placard. Patient said she is right down the street at [facility].  Patient said she can come back and pick up the placard. The number to contact patient is   [phone]

## 2023-03-10 NOTE — Telephone Encounter (Signed)
Form placed at front desk down stairs for her to pick up--pt is aware

## 2023-03-10 NOTE — Progress Notes (Signed)
Orthopedic Surgery Post-operative Office Visit   Procedure: left distal tibia and fibula fractures open reduction internal fixation Date of Surgery: 02/02/2023 (~1 month post-op)   Assessment: Patient is a 73 y.o. whose pain is slowly improving     Plan: -Operative plans complete -Nonweightbearing left lower extremity -Can leave incisions open to the air and allow soap/water run over the incisions, do not submerge -Encouraged her to work on ankle range of motion -DVT ppx: ASA 81mg  BID -Pain management: OTC medications -Return to office in 6 weeks, x-rays needed at next visit: left AP and lateral tibia   ___________________________________________________________________________     Subjective: Patient's pain has slowly been improving since she was last seen.  She still having pain mostly at the end of the day when trying to sleep at night.  When she gets asleep though, she does not wake up in pain.  She has not noticed any redness or drainage around her incisions.  She has continued to be nonweightbearing.  She has been working with physical therapy on ankle range of motion.  Objective:   General: no acute distress, appropriate affect Neurologic: alert, answering questions appropriately, following commands Respiratory: unlabored breathing on room air Skin: incisions are well approximated with no erythema seen, no no induration, no active/expressible drainage   MSK (LLE): Ankle range of motion from 0-30, sensation intact to light touch in sural/saphenous/deep peroneal/superficial peroneal/tibial nerve distributions, foot warm and well-perfused, palpable DP pulse, EHL/TA/GSC intact   Imaging: XRs of the left tibia from 03/10/2023 were independently reviewed and interpreted, showing distal tibia and fibula fracture fixation.  No lucency seen around either the medial or lateral fixation.  Fracture alignment appears maintained since last films.  Oblique fracture seen at the distal tibia  with a comminuted distal fibula fracture.  Step-off seen on the lateral view which is minimal on the tibia side but is larger on the fibula side.  No new fracture seen.  No interval callus formation seen.    Patient name: Kimberly Mccormick Patient MRN: [id number] Date of visit: 03/10/23

## 2023-03-13 DIAGNOSIS — Z9049 Acquired absence of other specified parts of digestive tract: Secondary | ICD-10-CM | POA: Diagnosis not present

## 2023-03-13 DIAGNOSIS — I671 Cerebral aneurysm, nonruptured: Secondary | ICD-10-CM | POA: Diagnosis not present

## 2023-03-13 DIAGNOSIS — I10 Essential (primary) hypertension: Secondary | ICD-10-CM | POA: Diagnosis not present

## 2023-03-13 DIAGNOSIS — Z9181 History of falling: Secondary | ICD-10-CM | POA: Diagnosis not present

## 2023-03-13 DIAGNOSIS — Z8673 Personal history of transient ischemic attack (TIA), and cerebral infarction without residual deficits: Secondary | ICD-10-CM | POA: Diagnosis not present

## 2023-03-13 DIAGNOSIS — S82452D Displaced comminuted fracture of shaft of left fibula, subsequent encounter for closed fracture with routine healing: Secondary | ICD-10-CM | POA: Diagnosis not present

## 2023-03-13 DIAGNOSIS — Z7982 Long term (current) use of aspirin: Secondary | ICD-10-CM | POA: Diagnosis not present

## 2023-03-13 DIAGNOSIS — Z853 Personal history of malignant neoplasm of breast: Secondary | ICD-10-CM | POA: Diagnosis not present

## 2023-03-13 DIAGNOSIS — K219 Gastro-esophageal reflux disease without esophagitis: Secondary | ICD-10-CM | POA: Diagnosis not present

## 2023-03-13 DIAGNOSIS — G20A1 Parkinson's disease without dyskinesia, without mention of fluctuations: Secondary | ICD-10-CM | POA: Diagnosis not present

## 2023-03-13 DIAGNOSIS — S82252D Displaced comminuted fracture of shaft of left tibia, subsequent encounter for closed fracture with routine healing: Secondary | ICD-10-CM | POA: Diagnosis not present

## 2023-03-22 DIAGNOSIS — S82202A Unspecified fracture of shaft of left tibia, initial encounter for closed fracture: Secondary | ICD-10-CM | POA: Diagnosis not present

## 2023-03-22 DIAGNOSIS — D132 Benign neoplasm of duodenum: Secondary | ICD-10-CM | POA: Diagnosis not present

## 2023-03-22 DIAGNOSIS — K30 Functional dyspepsia: Secondary | ICD-10-CM | POA: Diagnosis not present

## 2023-03-25 DIAGNOSIS — I671 Cerebral aneurysm, nonruptured: Secondary | ICD-10-CM | POA: Diagnosis not present

## 2023-03-25 DIAGNOSIS — S82252D Displaced comminuted fracture of shaft of left tibia, subsequent encounter for closed fracture with routine healing: Secondary | ICD-10-CM | POA: Diagnosis not present

## 2023-03-25 DIAGNOSIS — Z9049 Acquired absence of other specified parts of digestive tract: Secondary | ICD-10-CM | POA: Diagnosis not present

## 2023-03-25 DIAGNOSIS — Z8673 Personal history of transient ischemic attack (TIA), and cerebral infarction without residual deficits: Secondary | ICD-10-CM | POA: Diagnosis not present

## 2023-03-25 DIAGNOSIS — I10 Essential (primary) hypertension: Secondary | ICD-10-CM | POA: Diagnosis not present

## 2023-03-25 DIAGNOSIS — Z7982 Long term (current) use of aspirin: Secondary | ICD-10-CM | POA: Diagnosis not present

## 2023-03-25 DIAGNOSIS — K219 Gastro-esophageal reflux disease without esophagitis: Secondary | ICD-10-CM | POA: Diagnosis not present

## 2023-03-25 DIAGNOSIS — Z9181 History of falling: Secondary | ICD-10-CM | POA: Diagnosis not present

## 2023-03-25 DIAGNOSIS — S82452D Displaced comminuted fracture of shaft of left fibula, subsequent encounter for closed fracture with routine healing: Secondary | ICD-10-CM | POA: Diagnosis not present

## 2023-03-25 DIAGNOSIS — G20A1 Parkinson's disease without dyskinesia, without mention of fluctuations: Secondary | ICD-10-CM | POA: Diagnosis not present

## 2023-03-25 DIAGNOSIS — Z853 Personal history of malignant neoplasm of breast: Secondary | ICD-10-CM | POA: Diagnosis not present

## 2023-03-31 DIAGNOSIS — Z853 Personal history of malignant neoplasm of breast: Secondary | ICD-10-CM | POA: Diagnosis not present

## 2023-03-31 DIAGNOSIS — I671 Cerebral aneurysm, nonruptured: Secondary | ICD-10-CM | POA: Diagnosis not present

## 2023-03-31 DIAGNOSIS — G20A1 Parkinson's disease without dyskinesia, without mention of fluctuations: Secondary | ICD-10-CM | POA: Diagnosis not present

## 2023-03-31 DIAGNOSIS — Z8673 Personal history of transient ischemic attack (TIA), and cerebral infarction without residual deficits: Secondary | ICD-10-CM | POA: Diagnosis not present

## 2023-03-31 DIAGNOSIS — Z9049 Acquired absence of other specified parts of digestive tract: Secondary | ICD-10-CM | POA: Diagnosis not present

## 2023-03-31 DIAGNOSIS — Z9181 History of falling: Secondary | ICD-10-CM | POA: Diagnosis not present

## 2023-03-31 DIAGNOSIS — I10 Essential (primary) hypertension: Secondary | ICD-10-CM | POA: Diagnosis not present

## 2023-03-31 DIAGNOSIS — S82452D Displaced comminuted fracture of shaft of left fibula, subsequent encounter for closed fracture with routine healing: Secondary | ICD-10-CM | POA: Diagnosis not present

## 2023-03-31 DIAGNOSIS — S82252D Displaced comminuted fracture of shaft of left tibia, subsequent encounter for closed fracture with routine healing: Secondary | ICD-10-CM | POA: Diagnosis not present

## 2023-03-31 DIAGNOSIS — K219 Gastro-esophageal reflux disease without esophagitis: Secondary | ICD-10-CM | POA: Diagnosis not present

## 2023-03-31 DIAGNOSIS — Z7982 Long term (current) use of aspirin: Secondary | ICD-10-CM | POA: Diagnosis not present

## 2023-04-09 ENCOUNTER — Encounter: Payer: Self-pay | Admitting: Adult Health

## 2023-04-09 ENCOUNTER — Ambulatory Visit (INDEPENDENT_AMBULATORY_CARE_PROVIDER_SITE_OTHER): Payer: 59 | Admitting: Adult Health

## 2023-04-09 VITALS — BP 131/76 | HR 64

## 2023-04-09 DIAGNOSIS — G20A1 Parkinson's disease without dyskinesia, without mention of fluctuations: Secondary | ICD-10-CM | POA: Diagnosis not present

## 2023-04-09 DIAGNOSIS — S82202D Unspecified fracture of shaft of left tibia, subsequent encounter for closed fracture with routine healing: Secondary | ICD-10-CM | POA: Diagnosis not present

## 2023-04-09 DIAGNOSIS — S82402D Unspecified fracture of shaft of left fibula, subsequent encounter for closed fracture with routine healing: Secondary | ICD-10-CM

## 2023-04-09 MED ORDER — PRAMIPEXOLE DIHYDROCHLORIDE 0.75 MG PO TABS
0.7500 mg | ORAL_TABLET | Freq: Three times a day (TID) | ORAL | 3 refills | Status: DC
Start: 2023-04-09 — End: 2023-12-10

## 2023-04-09 NOTE — Progress Notes (Signed)
 [facility] [address] [place]. [place] [address] [phone]       OFFICE FOLLOW UP NOTE  Ms. Kimberly Mccormick Date of Birth:  06-08-1950 Medical Record Number:  [id number]    Primary neurologist: Dr. Frances Furbish Reason for visit: Parkinson's disease    SUBJECTIVE:   CHIEF COMPLAINT:  Chief Complaint  Patient presents with   Tremors    RM 8 with son Thayer Ohm Pt is well and stable. Reports things are about the same. No new concerns.     Brief HPI:   Kimberly Mccormick is a 73 y.o. female who is being followed for left-sided predominant Parkinson's disease.  Initially seen by Dr. Frances Furbish 11/2019 for several year history of intermittent left hand tremors and exam showed evidence of mild left-sided parkinsonism.  DaTscan 02/2020 supportive of diagnosis. Hx of right corona radiata infarct 12/2020 with incidental finding of cerebral aneurysm on workup s/p endovascular treatment with flow diverter device 04/2021 by Dr. Corliss Skains.   At prior visit, pramiprexol increased to 0.75mg  TID for c/o worsening tremor and referral placed to therapies.     Interval history:  Returns today for follow-up visit.  Reports overall symptoms stable on pramipexole.  Denies any significant improvement of tremors with higher dose but denies any progression since that time.  Tremor can worsen when she is upset or anxious.  She did suffer a left distal tibia and fibula fracture post fall in 01/2023 (slipped on ice/snow) requiring ORIF, she is currently nonweightbearing, has follow-up with Ortho on 3/31 with plans on obtaining a boot and then restarting therapies.  Denies any issues with constipation or swallowing.     ROS:   14 system review of systems performed and negative with exception of those listed in HPI  PMH:  Past Medical History:  Diagnosis Date   Aneurysm of left internal carotid artery    Small supraclinoid 5 mm   Breast cancer (HCC)    Left s/p lumpectomy (05/06/2018) and  adjuvant chemotherapy (06/23/2018) with Adriamycin and Cytoxan x4 followed by Taxol weekly x12   DDD (degenerative disc disease), lumbar    Essential hypertension    Genital warts    GERD (gastroesophageal reflux disease)    History of anemia    History of renal insufficiency    Hypertension    Insomnia    Iron deficiency anemia    Ischemic stroke [facility])    December 2022   Mixed hyperlipidemia    Osteoarthritis    Parkinson's disease (HCC)    Peripheral neuropathy    Related to chemotherapy   Personal history of chemotherapy 2020    PSH:  Past Surgical History:  Procedure Laterality Date   APPENDECTOMY  1966   BIOPSY  08/29/2020   Procedure: BIOPSY;  Surgeon: Dolores Frame, MD;  [facility];  Service: Gastroenterology;;   BIOPSY  07/24/2022   Procedure: BIOPSY;  Surgeon: Dolores Frame, MD;  [facility];  Service: Gastroenterology;;   BREAST LUMPECTOMY WITH RADIOACTIVE SEED AND SENTINEL LYMPH NODE BIOPSY Left 05/06/2018   Procedure: LEFT BREAST LUMPECTOMY WITH RADIOACTIVE SEED AND LEFT DEEP AXILLARY SENTINEL LYMPH NODE BIOPSY AND BLUE DYE INJECTION;  Surgeon: Claud Kelp, MD;  Location: [facility];  Service: General;  Laterality: Left;   CATARACT EXTRACTION W/PHACO Left 01/10/2014   Procedure: CATARACT EXTRACTION PHACO AND INTRAOCULAR LENS PLACEMENT ; CDE:  4.94;  Surgeon: Susa Simmonds, MD;  Location: [facility];  Service: Ophthalmology;  Laterality:  Left;   CESAREAN SECTION  1986   CHOLECYSTECTOMY     COLONOSCOPY WITH PROPOFOL N/A 07/25/2020   Procedure: COLONOSCOPY WITH PROPOFOL;  Surgeon: Dolores Frame, MD;  [facility];  Service: Gastroenterology;  Laterality: N/A;  10:55   ESOPHAGOGASTRODUODENOSCOPY (EGD) WITH PROPOFOL N/A 08/29/2020   Procedure: ESOPHAGOGASTRODUODENOSCOPY (EGD) WITH PROPOFOL;  Surgeon: Dolores Frame, MD;  [facility];  Service: Gastroenterology;   Laterality: N/A;  12:00   ESOPHAGOGASTRODUODENOSCOPY (EGD) WITH PROPOFOL N/A 07/24/2022   Procedure: ESOPHAGOGASTRODUODENOSCOPY (EGD) WITH PROPOFOL;  Surgeon: Dolores Frame, MD;  [facility];  Service: Gastroenterology;  Laterality: N/A;  12:45;ASA 1-2   HOT HEMOSTASIS  07/24/2022   Procedure: HOT HEMOSTASIS (ARGON PLASMA COAGULATION/BICAP);  Surgeon: Marguerita Merles, Reuel Boom, MD;  [facility];  Service: Gastroenterology;;   IR 3D INDEPENDENT WKST  04/25/2021   IR ANGIO INTRA EXTRACRAN SEL COM CAROTID INNOMINATE UNI R MOD SED  04/25/2021   IR ANGIO INTRA EXTRACRAN SEL INTERNAL CAROTID UNI L MOD SED  04/25/2021   IR ANGIOGRAM FOLLOW UP STUDY  04/25/2021   IR CT HEAD LTD  04/25/2021   IR NEURO EACH ADD'L AFTER BASIC UNI LEFT (MS)  04/25/2021   IR RADIOLOGIST EVAL & MGMT  03/22/2021   IR RADIOLOGIST EVAL & MGMT  03/28/2021   IR RADIOLOGIST EVAL & MGMT  05/15/2021   IR RADIOLOGIST EVAL & MGMT  09/28/2021   IR TRANSCATH/EMBOLIZ  04/25/2021   IR US GUIDE VASC ACCESS RIGHT  04/25/2021   MYRINGOTOMY WITH TUBE PLACEMENT Right 02/04/2017   Procedure: REVISION OF RIGHT MYRINGOTOMY WITH TUBE PLACEMENT, WITH EXAM OF LEFT EAR;  Surgeon: Newman Pies, MD;  Location: [facility];  Service: ENT;  Laterality: Right;   NASAL SINUS SURGERY  2016   polypectomy   OPEN REDUCTION INTERNAL FIXATION (ORIF) TIBIA/FIBULA FRACTURE Left 02/02/2023   Procedure: OPEN REDUCTION INTERNAL FIXATION (ORIF) LEFT TIBIA/FIBULA FRACTURE;  Surgeon: London Sheer, MD;  [facility];  Service: Orthopedics;  Laterality: Left;   POLYPECTOMY  07/25/2020   Procedure: POLYPECTOMY;  Surgeon: Dolores Frame, MD;  [facility];  Service: Gastroenterology;;   POLYPECTOMY  08/29/2020   Procedure: POLYPECTOMY;  Surgeon: Dolores Frame, MD;  [facility];  Service: Gastroenterology;;   POLYPECTOMY  07/24/2022   Procedure: POLYPECTOMY;  Surgeon: Dolores Frame, MD;   [facility];  Service: Gastroenterology;;   [facility] REMOVAL N/A 01/06/2019   Procedure: REMOVAL PORT-A-CATH;  Surgeon: Claud Kelp, MD;  Location: [facility];  Service: General;  Laterality: N/A;   PORTACATH PLACEMENT Right 06/16/2018   Procedure: INSERTION PORT-A-CATH;  Surgeon: Claud Kelp, MD;  Location: [facility];  Service: General;  Laterality: Right;   RADIOLOGY WITH ANESTHESIA N/A 04/25/2021   Procedure: IR WITH ANESTHESIA EMBOLIZATION;  Surgeon: Julieanne Cotton, MD;  Location: [facility];  Service: Radiology;  Laterality: N/A;   SCLEROTHERAPY  07/24/2022   Procedure: SCLEROTHERAPY;  Surgeon: Marguerita Merles, Reuel Boom, MD;  [facility];  Service: Gastroenterology;;   SUBMUCOSAL LIFTING INJECTION  08/29/2020   Procedure: SUBMUCOSAL LIFTING INJECTION;  Surgeon: Dolores Frame, MD;  [facility];  Service: Gastroenterology;;   Sunnie Nielsen LIFTING INJECTION  07/24/2022   Procedure: SUBMUCOSAL LIFTING INJECTION;  Surgeon: Dolores Frame, MD;  [facility];  Service: Gastroenterology;;   TONSILLECTOMY  1970    Social History:  Social History   Socioeconomic History  Marital status: Legally Separated    Spouse name: Not on file   Number of children: 2   Years of education: Not on file   Highest education level: Not on file  Occupational History    Comment: retired  Tobacco Use   Smoking status: Never   Smokeless tobacco: Never  Vaping Use   Vaping status: Never Used  Substance and Sexual Activity   Alcohol use: No   Drug use: No   Sexual activity: Not Currently    Birth control/protection: Post-menopausal  Other Topics Concern   Not on file  Social History Narrative   One son lives with her   Social Drivers of Health   Financial Resource Strain: Low Risk  (01/08/2021)   Overall Financial Resource Strain (CARDIA)    Difficulty of Paying Living Expenses: Not hard at all   Food Insecurity: No Food Insecurity (02/02/2023)   Hunger Vital Sign    Worried About Running Out of Food in the Last Year: Never true    Ran Out of Food in the Last Year: Never true  Transportation Needs: No Transportation Needs (02/02/2023)   PRAPARE - Administrator, Civil Service (Medical): No    Lack of Transportation (Non-Medical): No  Physical Activity: Inactive (01/08/2021)   Exercise Vital Sign    Days of Exercise per Week: 0 days    Minutes of Exercise per Session: 0 min  Stress: No Stress Concern Present (01/08/2021)   [employer] of Occupational Health - Occupational Stress Questionnaire    Feeling of Stress : Not at all  Social Connections: Moderately Integrated (02/02/2023)   Social Connection and Isolation Panel [NHANES]    Frequency of Communication with Friends and Family: More than three times a week    Frequency of Social Gatherings with Friends and Family: More than three times a week    Attends Religious Services: More than 4 times per year    Active Member of [employer] or Organizations: Yes    Attends Engineer, structural: More than 4 times per year    Marital Status: Divorced  Intimate Partner Violence: Not At Risk (02/02/2023)   Humiliation, Afraid, Rape, and Kick questionnaire    Fear of Current or Ex-Partner: No    Emotionally Abused: No    Physically Abused: No    Sexually Abused: No    Family History:  Family History  Problem Relation Age of Onset   Lupus Mother    Heart attack Father        age 72   Prostate cancer Father    Hypothyroidism Sister    Breast cancer Sister    Hypertension Sister    Breast cancer Sister    Hypertension Brother    Parkinson's disease Paternal Aunt    Stroke Neg Hx     Medications:   Current Outpatient Medications on File Prior to Visit  Medication Sig Dispense Refill   acyclovir (ZOVIRAX) 400 MG tablet Take 400 mg by mouth 2 (two) times daily.      aspirin 81 MG chewable tablet Chew 1  tablet (81 mg total) by mouth 2 (two) times daily with a meal. 180 tablet 0   cetirizine (ZYRTEC) 10 MG tablet Take 10 mg by mouth daily as needed for allergies.     metoprolol succinate (TOPROL-XL) 25 MG 24 hr tablet Take 25 mg by mouth daily.  3   omeprazole (PRILOSEC) 20 MG capsule Take 1 capsule (20 mg total) by mouth daily.  90 capsule 3   pramipexole (MIRAPEX) 0.75 MG tablet Take 1 tablet (0.75 mg total) by mouth 3 (three) times daily. 90 tablet 5   PRESCRIPTION MEDICATION Pt states she takes a cholesterol medication, does not recall name/dosage     sucralfate (CARAFATE) 1 GM/10ML suspension Take 10 mLs (1 g total) by mouth every 6 (six) hours as needed. 600 mL 0   zolpidem (AMBIEN) 10 MG tablet Take 5 mg by mouth at bedtime as needed for sleep.     ZTLIDO 1.8 % PTCH Apply 1 patch topically every other day.     No current facility-administered medications on file prior to visit.    Allergies:   Allergies  Allergen Reactions   Egg-Derived Products Nausea And Vomiting   Other Nausea And Vomiting    Dairy products\    Penicillins Hives    Has patient had a PCN reaction causing immediate rash, facial/tongue/throat swelling, SOB or lightheadedness with hypotension: No Has patient had a PCN reaction causing severe rash involving mucus membranes or skin necrosis: No Has patient had a PCN reaction that required hospitalization: No Has patient had a PCN reaction occurring within the last 10 years: No If all of the above answers are "NO", then may proceed with Cephalosporin use.       OBJECTIVE:  Physical Exam  Vitals:   04/09/23 1435  BP: 131/76  Pulse: 64    There is no height or weight on file to calculate BMI. No results found.   General: well developed, well nourished, very pleasant elderly female, seated, in no evident distress  Neurologic Exam Mental Status: Awake and fully alert. Oriented to place and time. Recent and remote memory intact. Attention span,  concentration and fund of knowledge appropriate. Mood and affect appropriate.  Mild hypophonia, no dysarthria or voice tremor noted.  Mild to moderate facial masking Cranial Nerves: Pupils equal, briskly reactive to light. Extraocular movements full without nystagmus. Visual fields full to confrontation. Hearing mildly impaired. Facial sensation intact. Face, tongue, palate moves normally and symmetrically.  Motor: Normal strength in all tested extremity muscles except limited testing LLE d/t recent fracture.  Mild resting tremor of LUE and mildly increased tone of left wrist.  Minimal postural tremor of LUE.  Unable to appreciate action or intention tremor bilaterally.  No lower extremity tremor.  Mild to moderate difficulty with finger taps on left side, fairly normal on right side.  Mildly impaired foot tap on left, normal on right. Sensory.: intact to touch , pinprick , position and vibratory sensation.  Gait and Station: deferred, currently nonweightbearing 2/2 recent LLE fracture Reflexes: 1+ and symmetric. Toes downgoing.         ASSESSMENT/PLAN: AMAURIA YOUNTS is a 73 y.o. year old female with left-sided predominant Parkinson's disease since 2021. Hx of right corona radiata infarct 12/2020 with incidental finding of cerebral aneurysm s/p flow diverter device 04/2021 with Dr. Corliss Skains, routinely followed by Dr. Corliss Skains.    1.  Parkinson disease  -overall stable since prior visit -Continue pramipexole 0.75mg  BID -Next step would be initiating carbidopa levodopa therapy but she declines interest at this time -Continue routine exercise and ensure adequate hydration  2. LLE fracture  -Tibia and fibula fracture s/p ORIF 01/2023, remains nonweightbearing, is scheduled for Ortho follow-up 3/31 with hopes of being put in a walking boot and restart therapies     Follow up in 6 months or call earlier if needed     CC:  PCP: Quintin Alto  E, MD    I spent 20 minutes of  face-to-face and non-face-to-face time with patient and son.  This included previsit chart review, lab review, study review, order entry, electronic health record documentation, patient education regarding above diagnoses and treatment plan and answered all other questions to patient's satisfaction  Ihor Austin, [facility]  [facility] [address] [place], [place] [address]  Phone [phone] Fax [phone] Note: This document was prepared with digital dictation and possible smart phrase technology. Any transcriptional errors that result from this process are unintentional.

## 2023-04-09 NOTE — Patient Instructions (Addendum)
 Your Plan:  Continue Mirapex 0.75mg  three times daily   Can try magnesium oxide, vitamin B2 and CoQ10 for headaches   Please call with any worsening symptoms or worsening headaches      Follow up in 6 months or call earlier if needed     Thank you for coming to see [place] at [facility]. I hope we have been able to provide you high quality care today.  You may receive a patient satisfaction survey over the next few weeks. We would appreciate your feedback and comments so that we may continue to improve ourselves and the health of our patients.

## 2023-04-15 ENCOUNTER — Other Ambulatory Visit: Payer: Self-pay | Admitting: Cardiology

## 2023-04-17 ENCOUNTER — Other Ambulatory Visit: Payer: Self-pay | Admitting: Cardiology

## 2023-04-21 ENCOUNTER — Encounter: Payer: Self-pay | Admitting: Orthopedic Surgery

## 2023-04-21 ENCOUNTER — Other Ambulatory Visit (INDEPENDENT_AMBULATORY_CARE_PROVIDER_SITE_OTHER)

## 2023-04-21 ENCOUNTER — Ambulatory Visit (INDEPENDENT_AMBULATORY_CARE_PROVIDER_SITE_OTHER): Payer: 59 | Admitting: Orthopedic Surgery

## 2023-04-21 ENCOUNTER — Telehealth: Payer: Self-pay | Admitting: Orthopedic Surgery

## 2023-04-21 DIAGNOSIS — S82402D Unspecified fracture of shaft of left fibula, subsequent encounter for closed fracture with routine healing: Secondary | ICD-10-CM | POA: Diagnosis not present

## 2023-04-21 DIAGNOSIS — S82202D Unspecified fracture of shaft of left tibia, subsequent encounter for closed fracture with routine healing: Secondary | ICD-10-CM | POA: Diagnosis not present

## 2023-04-21 DIAGNOSIS — S82402A Unspecified fracture of shaft of left fibula, initial encounter for closed fracture: Secondary | ICD-10-CM | POA: Diagnosis not present

## 2023-04-21 NOTE — Progress Notes (Signed)
 Orthopedic Surgery Post-operative Office Visit   Procedure: left distal tibia and fibula fractures open reduction internal fixation Date of Surgery: 02/02/2023 (~3 months post-op)   Assessment: Patient is a 73 y.o. who is not having any leg pain. Some lucency seen around a couple of the screws but alignment appears maintained     Plan: -Operative plans complete -Weight bearing as tolerated in boot on the left lower extremity -Can transition to regular shoes after a month in the boot -Okay to submerge wounds at this time -Outpatient PT referral provided to her to work on gait training and mobilization -DVT ppx: none -Pain management: tylenol -Return to office in 3 months, x-rays needed at next visit: left AP and lateral tibia   ___________________________________________________________________________     Subjective: Patient is not having any pain in her leg at this point. She has been non-weight bearing. She has not been working with PT recently. She has not noticed any redness or drainage around her incisions. Denies paresthesias and numbness.    Objective:   General: no acute distress, appropriate affect Neurologic: alert, answering questions appropriately, following commands Respiratory: unlabored breathing on room air Skin: incisions are well healed with no erythema, induration, active/expressible drainage   MSK (LLE): Ankle range of motion from 0-30, sensation intact to light touch in sural/saphenous/deep peroneal/superficial peroneal/tibial nerve distributions, foot warm and well-perfused, palpable DP pulse, EHL/TA/GSC intact   Imaging: XRs of the left tibia from 04/21/2023 were independently reviewed and interpreted, showing subtle lucency around the distal locking screws in the fibula. No lucency seen at the proximal screws. Lucency seen around on the of distal most screws in the tibia. No other lucency seen around the tibia screws. There is an oblique fracture at the distal  tibia and a comminuted distal fibula fracture. There is a step off sign on the lateral at the fibula. Fracture alignment appears maintained since films on 03/10/2023. No callus formation seen.      Patient name: Kimberly Mccormick Patient MRN: [id number] Date of visit: 04/21/23

## 2023-04-21 NOTE — Telephone Encounter (Signed)
 Patient called and wanted to know if she could change her PT location because it doesn't take her insurance. [phone]

## 2023-04-21 NOTE — Addendum Note (Signed)
 Addended by: Willia Craze on: 04/21/2023 03:08 PM   Modules accepted: Orders

## 2023-04-21 NOTE — Telephone Encounter (Signed)
 Talked with patient and advised her of message below.  Patient stated that she had just received a phone call from [facility] PT.

## 2023-04-23 ENCOUNTER — Encounter: Payer: Self-pay | Admitting: Cardiology

## 2023-04-23 ENCOUNTER — Ambulatory Visit: Payer: 59 | Attending: Cardiology | Admitting: Cardiology

## 2023-04-29 DIAGNOSIS — R5383 Other fatigue: Secondary | ICD-10-CM | POA: Diagnosis not present

## 2023-04-29 DIAGNOSIS — E782 Mixed hyperlipidemia: Secondary | ICD-10-CM | POA: Diagnosis not present

## 2023-04-29 DIAGNOSIS — I10 Essential (primary) hypertension: Secondary | ICD-10-CM | POA: Diagnosis not present

## 2023-04-29 DIAGNOSIS — Z1329 Encounter for screening for other suspected endocrine disorder: Secondary | ICD-10-CM | POA: Diagnosis not present

## 2023-04-29 DIAGNOSIS — D529 Folate deficiency anemia, unspecified: Secondary | ICD-10-CM | POA: Diagnosis not present

## 2023-05-06 DIAGNOSIS — S82202A Unspecified fracture of shaft of left tibia, initial encounter for closed fracture: Secondary | ICD-10-CM | POA: Diagnosis not present

## 2023-05-06 DIAGNOSIS — K30 Functional dyspepsia: Secondary | ICD-10-CM | POA: Diagnosis not present

## 2023-05-06 DIAGNOSIS — D132 Benign neoplasm of duodenum: Secondary | ICD-10-CM | POA: Diagnosis not present

## 2023-05-08 DIAGNOSIS — E782 Mixed hyperlipidemia: Secondary | ICD-10-CM | POA: Diagnosis not present

## 2023-05-08 DIAGNOSIS — I10 Essential (primary) hypertension: Secondary | ICD-10-CM | POA: Diagnosis not present

## 2023-05-08 DIAGNOSIS — I6930 Unspecified sequelae of cerebral infarction: Secondary | ICD-10-CM | POA: Diagnosis not present

## 2023-05-08 DIAGNOSIS — G620 Drug-induced polyneuropathy: Secondary | ICD-10-CM | POA: Diagnosis not present

## 2023-05-16 ENCOUNTER — Encounter: Payer: Self-pay | Admitting: Cardiology

## 2023-05-16 ENCOUNTER — Encounter (HOSPITAL_COMMUNITY): Payer: Self-pay

## 2023-05-16 ENCOUNTER — Ambulatory Visit (HOSPITAL_COMMUNITY): Attending: Orthopedic Surgery

## 2023-05-16 ENCOUNTER — Other Ambulatory Visit: Payer: Self-pay

## 2023-05-16 ENCOUNTER — Ambulatory Visit: Attending: Cardiology | Admitting: Cardiology

## 2023-05-16 VITALS — BP 118/80 | HR 67 | Ht 63.0 in | Wt 140.8 lb

## 2023-05-16 DIAGNOSIS — E782 Mixed hyperlipidemia: Secondary | ICD-10-CM

## 2023-05-16 DIAGNOSIS — M6281 Muscle weakness (generalized): Secondary | ICD-10-CM

## 2023-05-16 DIAGNOSIS — I10 Essential (primary) hypertension: Secondary | ICD-10-CM

## 2023-05-16 DIAGNOSIS — Z7409 Other reduced mobility: Secondary | ICD-10-CM

## 2023-05-16 DIAGNOSIS — S82402D Unspecified fracture of shaft of left fibula, subsequent encounter for closed fracture with routine healing: Secondary | ICD-10-CM | POA: Diagnosis not present

## 2023-05-16 DIAGNOSIS — S82202D Unspecified fracture of shaft of left tibia, subsequent encounter for closed fracture with routine healing: Secondary | ICD-10-CM | POA: Insufficient documentation

## 2023-05-16 NOTE — Patient Instructions (Addendum)

## 2023-05-16 NOTE — Therapy (Signed)
 OUTPATIENT PHYSICAL THERAPY LOWER EXTREMITY EVALUATION   Patient Name: Kimberly Mccormick MRN: [id number] DOB:[birth year], 73 y.o., female Today's Date: 05/16/2023  END OF SESSION:  PT End of Session - 05/16/23 1227     Visit Number 1    Date for PT Re-Evaluation 06/27/23    Authorization Type [employer] Dual Complete    Authorization Time Period no auth; no limit    Progress Note Due on Visit 10    PT Start Time 1151    PT Stop Time 1226    PT Time Calculation (min) 35 min    Activity Tolerance Patient tolerated treatment well    Behavior During Therapy WFL for tasks assessed/performed             Past Medical History:  Diagnosis Date   Aneurysm of left internal carotid artery    Small supraclinoid 5 mm   Breast cancer (HCC)    Left s/p lumpectomy (05/06/2018) and adjuvant chemotherapy (06/23/2018) with Adriamycin and Cytoxan  x4 followed by Taxol weekly x12   DDD (degenerative disc disease), lumbar    Essential hypertension    Genital warts    GERD (gastroesophageal reflux disease)    History of anemia    History of renal insufficiency    Hypertension    Insomnia    Iron deficiency anemia    Ischemic stroke [facility])    December 2022   Mixed hyperlipidemia    Osteoarthritis    Parkinson's disease (HCC)    Peripheral neuropathy    Related to chemotherapy   Personal history of chemotherapy 2020   Past Surgical History:  Procedure Laterality Date   APPENDECTOMY  1966   BIOPSY  08/29/2020   Procedure: BIOPSY;  Surgeon: Urban Garden, MD;  [facility];  Service: Gastroenterology;;   BIOPSY  07/24/2022   Procedure: BIOPSY;  Surgeon: Urban Garden, MD;  [facility];  Service: Gastroenterology;;   BREAST LUMPECTOMY WITH RADIOACTIVE SEED AND SENTINEL LYMPH NODE BIOPSY Left 05/06/2018   Procedure: LEFT BREAST LUMPECTOMY WITH RADIOACTIVE SEED AND LEFT DEEP AXILLARY SENTINEL LYMPH NODE BIOPSY AND BLUE DYE INJECTION;  Surgeon: Boyce Byes,  MD;  Location: [facility];  Service: General;  Laterality: Left;   CATARACT EXTRACTION W/PHACO Left 01/10/2014   Procedure: CATARACT EXTRACTION PHACO AND INTRAOCULAR LENS PLACEMENT ; CDE:  4.94;  Surgeon: Clay Cummins, MD;  Location: [facility];  Service: Ophthalmology;  Laterality: Left;   CESAREAN SECTION  1986   CHOLECYSTECTOMY     COLONOSCOPY WITH PROPOFOL  N/A 07/25/2020   Procedure: COLONOSCOPY WITH PROPOFOL ;  Surgeon: Urban Garden, MD;  [facility];  Service: Gastroenterology;  Laterality: N/A;  10:55   ESOPHAGOGASTRODUODENOSCOPY (EGD) WITH PROPOFOL  N/A 08/29/2020   Procedure: ESOPHAGOGASTRODUODENOSCOPY (EGD) WITH PROPOFOL ;  Surgeon: Urban Garden, MD;  [facility];  Service: Gastroenterology;  Laterality: N/A;  12:00   ESOPHAGOGASTRODUODENOSCOPY (EGD) WITH PROPOFOL  N/A 07/24/2022   Procedure: ESOPHAGOGASTRODUODENOSCOPY (EGD) WITH PROPOFOL ;  Surgeon: Urban Garden, MD;  [facility];  Service: Gastroenterology;  Laterality: N/A;  12:45;ASA 1-2   HOT HEMOSTASIS  07/24/2022   Procedure: HOT HEMOSTASIS (ARGON PLASMA COAGULATION/BICAP);  Surgeon: Umberto Ganong, Bearl Limes, MD;  [facility];  Service: Gastroenterology;;   IR 3D INDEPENDENT WKST  04/25/2021   IR ANGIO INTRA EXTRACRAN SEL COM CAROTID INNOMINATE UNI R MOD SED  04/25/2021   IR ANGIO INTRA EXTRACRAN SEL INTERNAL CAROTID UNI L MOD SED  04/25/2021  IR ANGIOGRAM FOLLOW UP STUDY  04/25/2021   IR CT HEAD LTD  04/25/2021   IR NEURO EACH ADD'L AFTER BASIC UNI LEFT (MS)  04/25/2021   IR RADIOLOGIST EVAL & MGMT  03/22/2021   IR RADIOLOGIST EVAL & MGMT  03/28/2021   IR RADIOLOGIST EVAL & MGMT  05/15/2021   IR RADIOLOGIST EVAL & MGMT  09/28/2021   IR TRANSCATH/EMBOLIZ  04/25/2021   IR US  GUIDE VASC ACCESS RIGHT  04/25/2021   MYRINGOTOMY WITH TUBE PLACEMENT Right 02/04/2017   Procedure: REVISION OF RIGHT MYRINGOTOMY WITH TUBE PLACEMENT, WITH EXAM OF LEFT EAR;  Surgeon:  Reynold Caves, MD;  Location: [facility];  Service: ENT;  Laterality: Right;   NASAL SINUS SURGERY  2016   polypectomy   OPEN REDUCTION INTERNAL FIXATION (ORIF) TIBIA/FIBULA FRACTURE Left 02/02/2023   Procedure: OPEN REDUCTION INTERNAL FIXATION (ORIF) LEFT TIBIA/FIBULA FRACTURE;  Surgeon: Diedra Fowler, MD;  Location: [facility];  Service: Orthopedics;  Laterality: Left;   POLYPECTOMY  07/25/2020   Procedure: POLYPECTOMY;  Surgeon: Urban Garden, MD;  [facility];  Service: Gastroenterology;;   POLYPECTOMY  08/29/2020   Procedure: POLYPECTOMY;  Surgeon: Urban Garden, MD;  [facility];  Service: Gastroenterology;;   POLYPECTOMY  07/24/2022   Procedure: POLYPECTOMY;  Surgeon: Urban Garden, MD;  [facility];  Service: Gastroenterology;;   [facility] REMOVAL N/A 01/06/2019   Procedure: REMOVAL PORT-A-CATH;  Surgeon: Boyce Byes, MD;  Location: [facility];  Service: General;  Laterality: N/A;   PORTACATH PLACEMENT Right 06/16/2018   Procedure: INSERTION PORT-A-CATH;  Surgeon: Boyce Byes, MD;  Location: [facility];  Service: General;  Laterality: Right;   RADIOLOGY WITH ANESTHESIA N/A 04/25/2021   Procedure: IR WITH ANESTHESIA EMBOLIZATION;  Surgeon: Luellen Sages, MD;  Location: [facility];  Service: Radiology;  Laterality: N/A;   SCLEROTHERAPY  07/24/2022   Procedure: SCLEROTHERAPY;  Surgeon: Umberto Ganong, Bearl Limes, MD;  [facility];  Service: Gastroenterology;;   [place] LIFTING INJECTION  08/29/2020   Procedure: SUBMUCOSAL LIFTING INJECTION;  Surgeon: Umberto Ganong, Bearl Limes, MD;  [facility];  Service: Gastroenterology;;   Jona [place] INJECTION  07/24/2022   Procedure: SUBMUCOSAL LIFTING INJECTION;  Surgeon: Urban Garden, MD;  [facility];  Service: Gastroenterology;;   TONSILLECTOMY  1970   Patient Active Problem List    Diagnosis Date Noted   Closed fracture of left fibula and tibia 02/02/2023   Acute diarrhea 12/23/2022   Functional dyspepsia 07/01/2022   DOE (dyspnea on exertion) 01/15/2022   Nausea with vomiting 05/28/2021   Duodenal adenoma 05/28/2021   Brain aneurysm 04/25/2021   History of herpes simplex infection 01/08/2021   Papanicolaou smear, as part of routine gynecological examination 01/08/2021   CVA (cerebral vascular accident) (HCC) 01/04/2021   Acute ischemic stroke (HCC) 01/03/2021   Hypokalemia 01/03/2021   Cerebral aneurysm 01/03/2021   Musculoskeletal pain 01/03/2021   Early satiety 11/27/2020   Chronic abdominal pain 07/13/2020   Port-A-Cath in place 06/23/2018   Malignant neoplasm of upper-inner quadrant of left breast in female, estrogen receptor positive (HCC) 04/16/2018   Essential hypertension 03/21/2015   Hyperlipidemia 03/21/2015   Genital herpes 03/21/2015   Fecal urgency 03/21/2015   GERD (gastroesophageal reflux disease) 03/21/2015    PCP: Alston Jerry, MD  REFERRING PROVIDER: Diedra Fowler, MD  REFERRING DIAG: S82.202D,S82.402D (ICD-10-CM) - Closed fracture of left tibia and fibula with routine healing, subsequent  encounter   THERAPY DIAG:  Muscle weakness (generalized)  Closed fracture of left tibia and fibula with routine healing, subsequent encounter  Impaired functional mobility, balance, gait, and endurance  Rationale for Evaluation and Treatment: Rehabilitation  ONSET DATE: 02/02/2023  SUBJECTIVE:   SUBJECTIVE STATEMENT: Pt had fall on the ice in Jan of 2025 and had ORIF in L tibia fibula. Had Home Health PT since then. With activity pt's pain gets 5/10.   PERTINENT HISTORY: Closed Fx of Left tibia and fibula Parkinson's Disease without Dyskinesia, without mentation of fluctioantios Cerebral Aneurysm PAIN:  Are you having pain? No  PRECAUTIONS: Left CAM Boot-can transition to regular after a month in the boot  RED  FLAGS: None   WEIGHT BEARING RESTRICTIONS:  WBAT on LLE  FALLS:  Has patient fallen in last 6 months?  Fall is MOI   PATIENT GOALS: "To be able to walk on my own with AD"  NEXT MD VISIT: in 3 months  OBJECTIVE:  Note: Objective measures were completed at Evaluation unless otherwise noted.  DIAGNOSTIC FINDINGS:   PATIENT SURVEYS:  LEFS 21/80 = 26.3%  COGNITION: Overall cognitive status: Within functional limits for tasks assessed     SENSATION: WFL  POSTURE: rounded shoulders and forward head  LOWER EXTREMITY ROM:  Active ROM Right eval Left eval  Hip flexion    Hip extension    Hip abduction    Hip adduction    Hip internal rotation    Hip external rotation    Knee flexion    Knee extension    Ankle dorsiflexion 15 A5  P8  Ankle plantarflexion    Ankle inversion WFL A2  P15  Ankle eversion WFL A10 20   (Blank rows = not tested)  LOWER EXTREMITY MMT:  MMT Right eval Left eval  Hip flexion    Hip extension    Hip abduction    Hip adduction    Hip internal rotation    Hip external rotation    Knee flexion    Knee extension 3+ 4+  Ankle dorsiflexion 2 4  Ankle plantarflexion 2+ 4  Ankle inversion 2+ 4  Ankle eversion 2+ 4   (Blank rows = not tested)   FUNCTIONAL TESTS:  30 Second Chair Stand Test: 5x  Norms:   Age 13-64 5-69 70-74 75-79 80-84 85-89 90-94  Women 15 15 14 13 12 11 9   Men 17 16 15 14 13 11 9    TUG: 47.96 seconds  GAIT: Distance walked: 48ft Assistive device utilized: Environmental consultant - 4 wheeled Level of assistance: SBA Comments: stepto pattern with RLE leading and then LLE                                                                                                                                TREATMENT DATE:   05/16/23 Evaluation  Seated ankle DF stretch Seated ankle pumps x 20 Seated ankle ABCs x 26  PATIENT EDUCATION:  Education details: PT Evaluation, findings, prognosis, frequency, attendance policy, and  HEP. Person educated: Patient Education method: Medical illustrator Education comprehension: verbalized understanding  HOME EXERCISE PROGRAM: Access Code: [email or web address] URL: [email or web address] Date: 05/16/2023 Prepared by: Irene Mannheim  Exercises - Seated Ankle Dorsiflexion Stretch  - 1 x daily - 7 x weekly - 3 sets - 10 reps - Seated Ankle Pumps  - 1 x daily - 7 x weekly - 3 sets - 20 reps - Seated Ankle Circles  - 1 x daily - 7 x weekly - 3 sets - 26 reps - Sit to Stand with Counter Support  - 1 x daily - 7 x weekly - 3 sets - 10 reps  ASSESSMENT:  CLINICAL IMPRESSION: Patient is a 73 y.o. female who was seen today for physical therapy evaluation and treatment for S82.202D,S82.402D (ICD-10-CM) - Closed fracture of left tibia and fibula with routine healing, subsequent encounter. Pt known to this clinic from previous PT POC for balance in setting of Parkinson's. Pt with recent ORIF in L ankle, is WBAT in boot and can wean off after 4/28. Pt with noticeable functional mobility deficits and limitations in ADLs and functional mobility due ot muscle weakness, ROM deficits, pain and outcome measures above. Pt will benefit from skilled Physical Therapy services to address deficits/limitations in order to improve functional and QOL.    OBJECTIVE IMPAIRMENTS: Abnormal gait, decreased activity tolerance, decreased balance, decreased mobility, difficulty walking, decreased ROM, decreased strength, hypomobility, postural dysfunction, and pain.   ACTIVITY LIMITATIONS: carrying, lifting, bending, standing, squatting, stairs, transfers, bed mobility, and locomotion level  PARTICIPATION LIMITATIONS: meal prep, driving, community activity, occupation, and yard work  PERSONAL FACTORS: Age and 1-2 comorbidities: Parkinson's  are also affecting patient's functional outcome.   REHAB POTENTIAL: Good  CLINICAL DECISION MAKING: Stable/uncomplicated  EVALUATION COMPLEXITY:  Low   GOALS: Goals reviewed with patient? No  SHORT TERM GOALS: Target date: 06/06/23  Pt will be independent with HEP in order to demonstrate participation in Physical Therapy POC.  Baseline: Goal status: INITIAL  2.  Pt will 3/10 pain during mobility in order to demonstrate improved pain while performing ADLs.  Baseline:  Goal status: INITIAL  LONG TERM GOALS: Target date: 06/27/23  Pt will improve 30 Second Chair Stand Test by at least 2 reps in order to demonstrate improved functional strength to return to desired activities.  Baseline: see objective.  Goal status: INITIAL  2.  Pt will improve TUG by at least 15 seconds in order to demonstrate improved functional ambulatory capacity in community setting.  Baseline: see objective.  Goal status: INITIAL  3.  Pt will improve LEFS score by 20% in order to demonstrate improved pain with functional goals and outcomes. Baseline: see objective.  Goal status: INITIAL  4.  Pt will improve LLE ankle ROM by  at least 5 degrees in order to improve symmetry during functional activities.. Baseline: see objective.  Goal status: INITIAL  5.  Pt will improve BLE MMT at least 1/2 grade  in order to improve endurance, balance and tolerance during functional activities.. Baseline: see objective.  Goal status: INITIAL   PLAN:  PT FREQUENCY: 2x/week  PT DURATION: 6 weeks  PLANNED INTERVENTIONS: 97164- PT Re-evaluation, 97750- Physical Performance Testing, 97110-Therapeutic exercises, 97530- Therapeutic activity, 97112- Neuromuscular re-education, 97535- Self Care, 70623- Manual therapy, [phone]- Gait training, [phone]- Electrical stimulation (unattended), [phone]- Electrical stimulation (manual), Patient/Family education, Balance training, Stair training, Joint mobilization, Joint manipulation, Spinal manipulation, Spinal mobilization,  DME instructions, Cryotherapy, and Moist heat  PLAN FOR NEXT SESSION: Left ankle ROM, strengthening, functional  strengthening  Astrid Lay, DPT [facility] [place] [address] [phone] office   Gatha Kaska, PT 05/16/2023, 12:28 PM

## 2023-05-16 NOTE — Progress Notes (Signed)
    Cardiology Office Note  Date: 05/16/2023   ID: Kimberly, Mccormick September 29, 1950, MRN [id number]  History of Present Illness: Kimberly Mccormick is a 73 y.o. female last seen in September 2023.  She is here today for a follow-up visit with family member.  Reports no new exertional symptoms, did have a fall in the snow/ice back in January with resulting left distal tibia/fibula fractures.  Still wearing a protective boot.  We reviewed her medications.  She is now off Brilinta , takes an aspirin  daily.  Also on Crestor  20 mg daily with good lipid control.  Her blood pressure is well-controlled today also.  I reviewed her ECG today which shows sinus rhythm with increased voltage and nonspecific T wave changes.  Physical Exam: VS:  BP 118/80   Pulse 67   Ht 5\' 3"  (1.6 m)   Wt 140 lb 12.8 oz (63.9 kg)   SpO2 99%   BMI 24.94 kg/m , BMI Body mass index is 24.94 kg/m.  Wt Readings from Last 3 Encounters:  05/16/23 140 lb 12.8 oz (63.9 kg)  02/02/23 132 lb 4.4 oz (60 kg)  12/23/22 132 lb 8 oz (60.1 kg)    General: Patient appears comfortable at rest. HEENT: Conjunctiva and lids normal. Neck: Supple, no elevated JVP or carotid bruits. Lungs: Clear to auscultation, nonlabored breathing at rest. Cardiac: Regular rate and rhythm, no S3 or significant systolic murmur, no pericardial rub. Extremities: Protective boot on left.  ECG:  An ECG dated 03/13/2022 was personally reviewed today and demonstrated:  Sinus rhythm with frequent PVCs, nonspecific T wave changes.  Labwork: March 2024: Cholesterol 141, triglycerides 112, HDL 62, LDL 59 02/02/2023: ALT 11; AST 20 02/03/2023: Hemoglobin 12.9; Platelets 237 02/04/2023: BUN 19; Creatinine, Ser 0.90; Potassium 4.2; Sodium 140   Other Studies Reviewed Today:  No interval cardiac testing for review today.  Assessment and Plan:  1.  Primary hypertension.  Blood pressure well-controlled today.  Continue Toprol -XL 25 mg daily.   2.  Mixed  hyperlipidemia with history of ischemic stroke.  LDL 59 and HDL 62 in March 2024.  She is doing well on Crestor  20 mg daily.   3.  Status post left internal carotid artery aneurysm embolization by Dr. Alvira Josephs.  She continues on aspirin  81 mg daily.  Disposition:  Follow up  1 year.  Signed, Gerard Knight, M.D., F.A.C.C. [facility]

## 2023-05-21 ENCOUNTER — Telehealth: Payer: Self-pay | Admitting: Orthopedic Surgery

## 2023-05-21 NOTE — Telephone Encounter (Signed)
 Pt called asking for a call from Dr Sulema Endo or Kathaleen Pale. Pt asking for compression socks and what strength and do they need to be knee high. Please call pt at [phone].

## 2023-05-21 NOTE — Telephone Encounter (Signed)
 Patient wants to know if she can wear the compression socks for her feet to help with the swelling

## 2023-05-21 NOTE — Telephone Encounter (Signed)
 I called and advised patient of Dr. Frieda Jew message, I did advise her to check with a local medical supply store to get them.

## 2023-05-22 ENCOUNTER — Ambulatory Visit (HOSPITAL_COMMUNITY): Attending: Adult Health

## 2023-05-22 ENCOUNTER — Encounter (HOSPITAL_COMMUNITY): Payer: Self-pay

## 2023-05-22 ENCOUNTER — Telehealth: Payer: Self-pay | Admitting: Orthopedic Surgery

## 2023-05-22 DIAGNOSIS — S82402D Unspecified fracture of shaft of left fibula, subsequent encounter for closed fracture with routine healing: Secondary | ICD-10-CM | POA: Insufficient documentation

## 2023-05-22 DIAGNOSIS — S82202D Unspecified fracture of shaft of left tibia, subsequent encounter for closed fracture with routine healing: Secondary | ICD-10-CM | POA: Diagnosis not present

## 2023-05-22 DIAGNOSIS — M6281 Muscle weakness (generalized): Secondary | ICD-10-CM | POA: Insufficient documentation

## 2023-05-22 DIAGNOSIS — Z7409 Other reduced mobility: Secondary | ICD-10-CM | POA: Insufficient documentation

## 2023-05-22 NOTE — Therapy (Signed)
 OUTPATIENT PHYSICAL THERAPY LOWER EXTREMITY EVALUATION   Patient Name: Kimberly Mccormick MRN: [id number] DOB:12/25/1950, 73 y.o., female Today's Date: 05/22/2023  END OF SESSION:  PT End of Session - 05/22/23 1522     Visit Number 2    Number of Visits 12    Date for PT Re-Evaluation 06/27/23    Authorization Type [employer] Dual Complete    Authorization Time Period no auth; no limit    Progress Note Due on Visit 10    PT Start Time 1522    PT Stop Time 1600    PT Time Calculation (min) 38 min    Activity Tolerance Patient tolerated treatment well    Behavior During Therapy WFL for tasks assessed/performed             Past Medical History:  Diagnosis Date   Aneurysm of left internal carotid artery    Small supraclinoid 5 mm   Breast cancer (HCC)    Left s/p lumpectomy (05/06/2018) and adjuvant chemotherapy (06/23/2018) with Adriamycin and Cytoxan  x4 followed by Taxol weekly x12   DDD (degenerative disc disease), lumbar    Essential hypertension    Genital warts    GERD (gastroesophageal reflux disease)    History of anemia    History of renal insufficiency    Hypertension    Insomnia    Iron deficiency anemia    Ischemic stroke [facility])    December 2022   Mixed hyperlipidemia    Osteoarthritis    Parkinson's disease (HCC)    Peripheral neuropathy    Related to chemotherapy   Personal history of chemotherapy 2020   Past Surgical History:  Procedure Laterality Date   APPENDECTOMY  1966   BIOPSY  08/29/2020   Procedure: BIOPSY;  Surgeon: Urban Garden, MD;  [facility];  Service: Gastroenterology;;   BIOPSY  07/24/2022   Procedure: BIOPSY;  Surgeon: Urban Garden, MD;  [facility]: [facility];  Service: Gastroenterology;;   BREAST LUMPECTOMY WITH RADIOACTIVE SEED AND SENTINEL LYMPH NODE BIOPSY Left 05/06/2018   Procedure: LEFT BREAST LUMPECTOMY WITH RADIOACTIVE SEED AND LEFT DEEP AXILLARY SENTINEL LYMPH NODE BIOPSY AND BLUE DYE INJECTION;   Surgeon: Boyce Byes, MD;  Location: [facility];  Service: General;  Laterality: Left;   CATARACT EXTRACTION W/PHACO Left 01/10/2014   Procedure: CATARACT EXTRACTION PHACO AND INTRAOCULAR LENS PLACEMENT ; CDE:  4.94;  Surgeon: Clay Cummins, MD;  Location: [facility];  Service: Ophthalmology;  Laterality: Left;   CESAREAN SECTION  1986   CHOLECYSTECTOMY     COLONOSCOPY WITH PROPOFOL  N/A 07/25/2020   Procedure: COLONOSCOPY WITH PROPOFOL ;  Surgeon: Urban Garden, MD;  [facility];  Service: Gastroenterology;  Laterality: N/A;  10:55   ESOPHAGOGASTRODUODENOSCOPY (EGD) WITH PROPOFOL  N/A 08/29/2020   Procedure: ESOPHAGOGASTRODUODENOSCOPY (EGD) WITH PROPOFOL ;  Surgeon: Urban Garden, MD;  [facility];  Service: Gastroenterology;  Laterality: N/A;  12:00   ESOPHAGOGASTRODUODENOSCOPY (EGD) WITH PROPOFOL  N/A 07/24/2022   Procedure: ESOPHAGOGASTRODUODENOSCOPY (EGD) WITH PROPOFOL ;  Surgeon: Urban Garden, MD;  [facility];  Service: Gastroenterology;  Laterality: N/A;  12:45;ASA 1-2   HOT HEMOSTASIS  07/24/2022   Procedure: HOT HEMOSTASIS (ARGON PLASMA COAGULATION/BICAP);  Surgeon: Umberto Ganong, Bearl Limes, MD;  [facility];  Service: Gastroenterology;;   IR 3D INDEPENDENT WKST  04/25/2021   IR ANGIO INTRA EXTRACRAN SEL COM CAROTID INNOMINATE UNI R MOD SED  04/25/2021   IR ANGIO INTRA EXTRACRAN SEL INTERNAL  CAROTID UNI L MOD SED  04/25/2021   IR ANGIOGRAM FOLLOW UP STUDY  04/25/2021   IR CT HEAD LTD  04/25/2021   IR NEURO EACH ADD'L AFTER BASIC UNI LEFT (MS)  04/25/2021   IR RADIOLOGIST EVAL & MGMT  03/22/2021   IR RADIOLOGIST EVAL & MGMT  03/28/2021   IR RADIOLOGIST EVAL & MGMT  05/15/2021   IR RADIOLOGIST EVAL & MGMT  09/28/2021   IR TRANSCATH/EMBOLIZ  04/25/2021   IR US  GUIDE VASC ACCESS RIGHT  04/25/2021   MYRINGOTOMY WITH TUBE PLACEMENT Right 02/04/2017   Procedure: REVISION OF RIGHT MYRINGOTOMY WITH TUBE PLACEMENT, WITH EXAM  OF LEFT EAR;  Surgeon: Reynold Caves, MD;  Location: [facility];  Service: ENT;  Laterality: Right;   NASAL SINUS SURGERY  2016   polypectomy   OPEN REDUCTION INTERNAL FIXATION (ORIF) TIBIA/FIBULA FRACTURE Left 02/02/2023   Procedure: OPEN REDUCTION INTERNAL FIXATION (ORIF) LEFT TIBIA/FIBULA FRACTURE;  Surgeon: Diedra Fowler, MD;  Location: [facility];  Service: Orthopedics;  Laterality: Left;   POLYPECTOMY  07/25/2020   Procedure: POLYPECTOMY;  Surgeon: Urban Garden, MD;  [facility];  Service: Gastroenterology;;   POLYPECTOMY  08/29/2020   Procedure: POLYPECTOMY;  Surgeon: Urban Garden, MD;  [facility];  Service: Gastroenterology;;   POLYPECTOMY  07/24/2022   Procedure: POLYPECTOMY;  Surgeon: Urban Garden, MD;  [facility];  Service: Gastroenterology;;   [facility] REMOVAL N/A 01/06/2019   Procedure: REMOVAL PORT-A-CATH;  Surgeon: Boyce Byes, MD;  Location: [facility];  Service: General;  Laterality: N/A;   PORTACATH PLACEMENT Right 06/16/2018   Procedure: INSERTION PORT-A-CATH;  Surgeon: Boyce Byes, MD;  Location: [facility];  Service: General;  Laterality: Right;   RADIOLOGY WITH ANESTHESIA N/A 04/25/2021   Procedure: IR WITH ANESTHESIA EMBOLIZATION;  Surgeon: Luellen Sages, MD;  Location: [facility];  Service: Radiology;  Laterality: N/A;   SCLEROTHERAPY  07/24/2022   Procedure: SCLEROTHERAPY;  Surgeon: Umberto Ganong, Bearl Limes, MD;  [facility];  Service: Gastroenterology;;   [place] LIFTING INJECTION  08/29/2020   Procedure: SUBMUCOSAL LIFTING INJECTION;  Surgeon: Umberto Ganong, Bearl Limes, MD;  [facility];  Service: Gastroenterology;;   Jona Negro INJECTION  07/24/2022   Procedure: SUBMUCOSAL LIFTING INJECTION;  Surgeon: Urban Garden, MD;  [facility];  Service: Gastroenterology;;   TONSILLECTOMY  1970   Patient  Active Problem List   Diagnosis Date Noted   Closed fracture of left fibula and tibia 02/02/2023   Acute diarrhea 12/23/2022   Functional dyspepsia 07/01/2022   DOE (dyspnea on exertion) 01/15/2022   Nausea with vomiting 05/28/2021   Duodenal adenoma 05/28/2021   Brain aneurysm 04/25/2021   History of herpes simplex infection 01/08/2021   Papanicolaou smear, as part of routine gynecological examination 01/08/2021   CVA (cerebral vascular accident) (HCC) 01/04/2021   Acute ischemic stroke (HCC) 01/03/2021   Hypokalemia 01/03/2021   Cerebral aneurysm 01/03/2021   Musculoskeletal pain 01/03/2021   Early satiety 11/27/2020   Chronic abdominal pain 07/13/2020   Port-A-Cath in place 06/23/2018   Malignant neoplasm of upper-inner quadrant of left breast in female, estrogen receptor positive (HCC) 04/16/2018   Essential hypertension 03/21/2015   Hyperlipidemia 03/21/2015   Genital herpes 03/21/2015   Fecal urgency 03/21/2015   GERD (gastroesophageal reflux disease) 03/21/2015    PCP: Alston Jerry, MD  REFERRING PROVIDER: Diedra Fowler, MD  REFERRING DIAG: S82.202D,S82.402D (ICD-10-CM) - Closed fracture  of left tibia and fibula with routine healing, subsequent encounter   THERAPY DIAG:  Muscle weakness (generalized)  Closed fracture of left tibia and fibula with routine healing, subsequent encounter  Impaired functional mobility, balance, gait, and endurance  Rationale for Evaluation and Treatment: Rehabilitation  ONSET DATE: 02/02/2023  SUBJECTIVE:   SUBJECTIVE STATEMENT: Pt reports 4/10 with pain today. Reports most use of crutches, will use RW sometimes when going to church.  Pt had fall on the ice in Jan of 2025 and had ORIF in L tibia fibula. Had Home Health PT since then. With activity pt's pain gets 5/10.   PERTINENT HISTORY: Closed Fx of Left tibia and fibula Parkinson's Disease without Dyskinesia, without mentation of fluctioantios Cerebral Aneurysm PAIN:   Are you having pain? No  PRECAUTIONS: Left CAM Boot-can transition to regular after a month in the boot  RED FLAGS: None   WEIGHT BEARING RESTRICTIONS:  WBAT on LLE  FALLS:  Has patient fallen in last 6 months?  Fall is MOI   PATIENT GOALS: "To be able to walk on my own with AD"  NEXT MD VISIT: in 3 months  OBJECTIVE:  Note: Objective measures were completed at Evaluation unless otherwise noted.  DIAGNOSTIC FINDINGS:   PATIENT SURVEYS:  LEFS 21/80 = 26.3%  COGNITION: Overall cognitive status: Within functional limits for tasks assessed     SENSATION: WFL  POSTURE: rounded shoulders and forward head  LOWER EXTREMITY ROM:  Active ROM Right eval Left eval  Hip flexion    Hip extension    Hip abduction    Hip adduction    Hip internal rotation    Hip external rotation    Knee flexion    Knee extension    Ankle dorsiflexion 15 A5  P8  Ankle plantarflexion    Ankle inversion WFL A2  P15  Ankle eversion WFL A10 20   (Blank rows = not tested)  LOWER EXTREMITY MMT:  MMT Right eval Left eval  Hip flexion    Hip extension    Hip abduction    Hip adduction    Hip internal rotation    Hip external rotation    Knee flexion    Knee extension 3+ 4+  Ankle dorsiflexion 2 4  Ankle plantarflexion 2+ 4  Ankle inversion 2+ 4  Ankle eversion 2+ 4   (Blank rows = not tested)   FUNCTIONAL TESTS:  30 Second Chair Stand Test: 5x  Norms:   Age 80-64 81-69 78-74 75-79 38-84 85-89 90-94  Women 15 15 14 13 12 11 9   Men 17 16 15 14 13 11 9    TUG: 47.96 seconds  GAIT: Distance walked: 70ft Assistive device utilized: Environmental consultant - 4 wheeled Level of assistance: SBA Comments: stepto pattern with RLE leading and then LLE  TREATMENT DATE:  05/22/23: Review of Goals and HEP STS, 2x10 Ankle pump, 2x10 Ankle circles, 2x10 each way   Seated DF stretch, 10x Toe scrunches with towel, 5" holdx10 Seated Fitter Board,  forward/backward 10x Medial/lateral 10x Gait training with one crutch and boot, 64ft, v cues for form  05/16/23  Evaluation  Seated ankle DF stretch Seated ankle pumps x 20 Seated ankle ABCs x 26   PATIENT EDUCATION:  Education details: PT Evaluation, findings, prognosis, frequency, attendance policy, and HEP. Person educated: Patient Education method: Medical illustrator Education comprehension: verbalized understanding  HOME EXERCISE PROGRAM: Access Code: [email or web address] URL: [email or web address] Date: 05/16/2023 Prepared by: Irene Mannheim  Exercises - Seated Ankle Dorsiflexion Stretch  - 1 x daily - 7 x weekly - 3 sets - 10 reps - Seated Ankle Pumps  - 1 x daily - 7 x weekly - 3 sets - 20 reps - Seated Ankle Circles  - 1 x daily - 7 x weekly - 3 sets - 26 reps - Sit to Stand with Counter Support  - 1 x daily - 7 x weekly - 3 sets - 10 reps  ASSESSMENT:  CLINICAL IMPRESSION: Patient tolerated session well. Review of goals and HEP. Patient wearing boot to today's session but told to bring regular shoe to next session to begin practice with weight bearing in regular shoes as it is time to wean off boot. During STS, pt reports feeling off balance and requires UE use for support. Verbal cueing for performing ROM to max limits during ankle pumps and ankle circles for improved ROM. Demonstration needed for seated ankle DF stretch. Remainder of session spent performing toe scrunches and [employer]. Therapist blocks knee t/o Fitter board motion to limit motion coming from knee. End of session spent gait training with one crutch, verbal cueing and visual demonstration needed for sequencing. Pt demo dec velocity and fear during training but overall steady. Patient will benefit from continued skilled physical therapy in order to address ankle ROM, LE strength, and balance in order to  improve gait and overall function.    Patient is a 73 y.o. female who was seen today for physical therapy evaluation and treatment for S82.202D,S82.402D (ICD-10-CM) - Closed fracture of left tibia and fibula with routine healing, subsequent encounter. Pt known to this clinic from previous PT POC for balance in setting of Parkinson's. Pt with recent ORIF in L ankle, is WBAT in boot and can wean off after 4/28. Pt with noticeable functional mobility deficits and limitations in ADLs and functional mobility due ot muscle weakness, ROM deficits, pain and outcome measures above. Pt will benefit from skilled Physical Therapy services to address deficits/limitations in order to improve functional and QOL.    OBJECTIVE IMPAIRMENTS: Abnormal gait, decreased activity tolerance, decreased balance, decreased mobility, difficulty walking, decreased ROM, decreased strength, hypomobility, postural dysfunction, and pain.   ACTIVITY LIMITATIONS: carrying, lifting, bending, standing, squatting, stairs, transfers, bed mobility, and locomotion level  PARTICIPATION LIMITATIONS: meal prep, driving, community activity, occupation, and yard work  PERSONAL FACTORS: Age and 1-2 comorbidities: Parkinson's  are also affecting patient's functional outcome.   REHAB POTENTIAL: Good  CLINICAL DECISION MAKING: Stable/uncomplicated  EVALUATION COMPLEXITY: Low   GOALS: Goals reviewed with patient? Yes  SHORT TERM GOALS: Target date: 06/06/23  Pt will be independent with HEP in order to demonstrate participation in Physical Therapy POC.  Baseline: Goal status: INITIAL  2.  Pt will 3/10 pain during mobility in order to demonstrate improved pain while performing  ADLs.  Baseline:  Goal status: INITIAL  LONG TERM GOALS: Target date: 06/27/23  Pt will improve 30 Second Chair Stand Test by at least 2 reps in order to demonstrate improved functional strength to return to desired activities.  Baseline: see objective.  Goal  status: INITIAL  2.  Pt will improve TUG by at least 15 seconds in order to demonstrate improved functional ambulatory capacity in community setting.  Baseline: see objective.  Goal status: INITIAL  3.  Pt will improve LEFS score by 20% in order to demonstrate improved pain with functional goals and outcomes. Baseline: see objective.  Goal status: INITIAL  4.  Pt will improve LLE ankle ROM by  at least 5 degrees in order to improve symmetry during functional activities.. Baseline: see objective.  Goal status: INITIAL  5.  Pt will improve BLE MMT at least 1/2 grade  in order to improve endurance, balance and tolerance during functional activities.. Baseline: see objective.  Goal status: INITIAL   PLAN:  PT FREQUENCY: 2x/week  PT DURATION: 6 weeks  PLANNED INTERVENTIONS: 16109- PT Re-evaluation, 97750- Physical Performance Testing, 97110-Therapeutic exercises, 97530- Therapeutic activity, [id number]- Neuromuscular re-education, 97535- Self Care, [id number]- Manual therapy, [phone]- Gait training, [phone]- Electrical stimulation (unattended), [phone]- Electrical stimulation (manual), Patient/Family education, Balance training, Stair training, Joint mobilization, Joint manipulation, Spinal manipulation, Spinal mobilization, DME instructions, Cryotherapy, and Moist heat  PLAN FOR NEXT SESSION: Left ankle ROM, strengthening, functional strengthening   4:13 PM, 05/22/23 Tracker Mance Powell-Butler, PT, DPT [facility] [place]

## 2023-05-22 NOTE — Telephone Encounter (Signed)
 Tried to call her VM was full, I put rx in box downstairs for her to pick up. I will try to call again later.

## 2023-05-22 NOTE — Telephone Encounter (Signed)
 Patient called back please see other message

## 2023-05-22 NOTE — Telephone Encounter (Signed)
 Pt returned call to [place]. Explained to pt RX was left upfront to pick up script. Pt stated she will send a family member. I asked that whoever she send have identification not expired and able to sign on her behalf. Pt agreed

## 2023-05-29 ENCOUNTER — Ambulatory Visit (HOSPITAL_COMMUNITY)

## 2023-05-29 ENCOUNTER — Encounter (HOSPITAL_COMMUNITY): Payer: Self-pay

## 2023-05-29 DIAGNOSIS — S82402D Unspecified fracture of shaft of left fibula, subsequent encounter for closed fracture with routine healing: Secondary | ICD-10-CM | POA: Diagnosis not present

## 2023-05-29 DIAGNOSIS — S82202D Unspecified fracture of shaft of left tibia, subsequent encounter for closed fracture with routine healing: Secondary | ICD-10-CM | POA: Diagnosis not present

## 2023-05-29 DIAGNOSIS — Z7409 Other reduced mobility: Secondary | ICD-10-CM | POA: Diagnosis not present

## 2023-05-29 DIAGNOSIS — M6281 Muscle weakness (generalized): Secondary | ICD-10-CM | POA: Diagnosis not present

## 2023-05-29 NOTE — Therapy (Signed)
 OUTPATIENT PHYSICAL THERAPY LOWER EXTREMITY EVALUATION   Patient Name: Kimberly Mccormick MRN: [id number] DOB:09-Jul-1950, 73 y.o., female Today's Date: 05/29/2023  END OF SESSION:  PT End of Session - 05/29/23 1303     Visit Number 3    Number of Visits 12    Date for PT Re-Evaluation 06/27/23    Authorization Type [employer] Dual Complete    Authorization Time Period no auth; no limit    Progress Note Due on Visit 10    PT Start Time 1304    PT Stop Time 1345    PT Time Calculation (min) 41 min    Activity Tolerance Patient tolerated treatment well    Behavior During Therapy WFL for tasks assessed/performed             Past Medical History:  Diagnosis Date   Aneurysm of left internal carotid artery    Small supraclinoid 5 mm   Breast cancer (HCC)    Left s/p lumpectomy (05/06/2018) and adjuvant chemotherapy (06/23/2018) with Adriamycin and Cytoxan  x4 followed by Taxol weekly x12   DDD (degenerative disc disease), lumbar    Essential hypertension    Genital warts    GERD (gastroesophageal reflux disease)    History of anemia    History of renal insufficiency    Hypertension    Insomnia    Iron deficiency anemia    Ischemic stroke [facility])    December 2022   Mixed hyperlipidemia    Osteoarthritis    Parkinson's disease (HCC)    Peripheral neuropathy    Related to chemotherapy   Personal history of chemotherapy 2020   Past Surgical History:  Procedure Laterality Date   APPENDECTOMY  1966   BIOPSY  08/29/2020   Procedure: BIOPSY;  Surgeon: Urban Garden, MD;  [facility];  Service: Gastroenterology;;   BIOPSY  07/24/2022   Procedure: BIOPSY;  Surgeon: Urban Garden, MD;  [facility]: [facility];  Service: Gastroenterology;;   BREAST LUMPECTOMY WITH RADIOACTIVE SEED AND SENTINEL LYMPH NODE BIOPSY Left 05/06/2018   Procedure: LEFT BREAST LUMPECTOMY WITH RADIOACTIVE SEED AND LEFT DEEP AXILLARY SENTINEL LYMPH NODE BIOPSY AND BLUE DYE INJECTION;   Surgeon: Boyce Byes, MD;  Location: [facility];  Service: General;  Laterality: Left;   CATARACT EXTRACTION W/PHACO Left 01/10/2014   Procedure: CATARACT EXTRACTION PHACO AND INTRAOCULAR LENS PLACEMENT ; CDE:  4.94;  Surgeon: Clay Cummins, MD;  Location: [facility];  Service: Ophthalmology;  Laterality: Left;   CESAREAN SECTION  1986   CHOLECYSTECTOMY     COLONOSCOPY WITH PROPOFOL  N/A 07/25/2020   Procedure: COLONOSCOPY WITH PROPOFOL ;  Surgeon: Urban Garden, MD;  [facility];  Service: Gastroenterology;  Laterality: N/A;  10:55   ESOPHAGOGASTRODUODENOSCOPY (EGD) WITH PROPOFOL  N/A 08/29/2020   Procedure: ESOPHAGOGASTRODUODENOSCOPY (EGD) WITH PROPOFOL ;  Surgeon: Urban Garden, MD;  [facility];  Service: Gastroenterology;  Laterality: N/A;  12:00   ESOPHAGOGASTRODUODENOSCOPY (EGD) WITH PROPOFOL  N/A 07/24/2022   Procedure: ESOPHAGOGASTRODUODENOSCOPY (EGD) WITH PROPOFOL ;  Surgeon: Urban Garden, MD;  [facility];  Service: Gastroenterology;  Laterality: N/A;  12:45;ASA 1-2   HOT HEMOSTASIS  07/24/2022   Procedure: HOT HEMOSTASIS (ARGON PLASMA COAGULATION/BICAP);  Surgeon: Umberto Ganong, Bearl Limes, MD;  [facility];  Service: Gastroenterology;;   IR 3D INDEPENDENT WKST  04/25/2021   IR ANGIO INTRA EXTRACRAN SEL COM CAROTID INNOMINATE UNI R MOD SED  04/25/2021   IR ANGIO INTRA EXTRACRAN SEL INTERNAL  CAROTID UNI L MOD SED  04/25/2021   IR ANGIOGRAM FOLLOW UP STUDY  04/25/2021   IR CT HEAD LTD  04/25/2021   IR NEURO EACH ADD'L AFTER BASIC UNI LEFT (MS)  04/25/2021   IR RADIOLOGIST EVAL & MGMT  03/22/2021   IR RADIOLOGIST EVAL & MGMT  03/28/2021   IR RADIOLOGIST EVAL & MGMT  05/15/2021   IR RADIOLOGIST EVAL & MGMT  09/28/2021   IR TRANSCATH/EMBOLIZ  04/25/2021   IR US  GUIDE VASC ACCESS RIGHT  04/25/2021   MYRINGOTOMY WITH TUBE PLACEMENT Right 02/04/2017   Procedure: REVISION OF RIGHT MYRINGOTOMY WITH TUBE PLACEMENT, WITH EXAM  OF LEFT EAR;  Surgeon: Reynold Caves, MD;  Location: [facility];  Service: ENT;  Laterality: Right;   NASAL SINUS SURGERY  2016   polypectomy   OPEN REDUCTION INTERNAL FIXATION (ORIF) TIBIA/FIBULA FRACTURE Left 02/02/2023   Procedure: OPEN REDUCTION INTERNAL FIXATION (ORIF) LEFT TIBIA/FIBULA FRACTURE;  Surgeon: Diedra Fowler, MD;  Location: [facility];  Service: Orthopedics;  Laterality: Left;   POLYPECTOMY  07/25/2020   Procedure: POLYPECTOMY;  Surgeon: Urban Garden, MD;  [facility];  Service: Gastroenterology;;   POLYPECTOMY  08/29/2020   Procedure: POLYPECTOMY;  Surgeon: Urban Garden, MD;  [facility];  Service: Gastroenterology;;   POLYPECTOMY  07/24/2022   Procedure: POLYPECTOMY;  Surgeon: Urban Garden, MD;  [facility];  Service: Gastroenterology;;   [facility] REMOVAL N/A 01/06/2019   Procedure: REMOVAL PORT-A-CATH;  Surgeon: Boyce Byes, MD;  Location: [facility];  Service: General;  Laterality: N/A;   PORTACATH PLACEMENT Right 06/16/2018   Procedure: INSERTION PORT-A-CATH;  Surgeon: Boyce Byes, MD;  Location: [facility];  Service: General;  Laterality: Right;   RADIOLOGY WITH ANESTHESIA N/A 04/25/2021   Procedure: IR WITH ANESTHESIA EMBOLIZATION;  Surgeon: Luellen Sages, MD;  Location: [facility];  Service: Radiology;  Laterality: N/A;   SCLEROTHERAPY  07/24/2022   Procedure: SCLEROTHERAPY;  Surgeon: Umberto Ganong, Bearl Limes, MD;  [facility];  Service: Gastroenterology;;   [place] LIFTING INJECTION  08/29/2020   Procedure: SUBMUCOSAL LIFTING INJECTION;  Surgeon: Umberto Ganong, Bearl Limes, MD;  [facility];  Service: Gastroenterology;;   Jona Negro INJECTION  07/24/2022   Procedure: SUBMUCOSAL LIFTING INJECTION;  Surgeon: Urban Garden, MD;  [facility];  Service: Gastroenterology;;   TONSILLECTOMY  1970   Patient  Active Problem List   Diagnosis Date Noted   Closed fracture of left fibula and tibia 02/02/2023   Acute diarrhea 12/23/2022   Functional dyspepsia 07/01/2022   DOE (dyspnea on exertion) 01/15/2022   Nausea with vomiting 05/28/2021   Duodenal adenoma 05/28/2021   Brain aneurysm 04/25/2021   History of herpes simplex infection 01/08/2021   Papanicolaou smear, as part of routine gynecological examination 01/08/2021   CVA (cerebral vascular accident) (HCC) 01/04/2021   Acute ischemic stroke (HCC) 01/03/2021   Hypokalemia 01/03/2021   Cerebral aneurysm 01/03/2021   Musculoskeletal pain 01/03/2021   Early satiety 11/27/2020   Chronic abdominal pain 07/13/2020   Port-A-Cath in place 06/23/2018   Malignant neoplasm of upper-inner quadrant of left breast in female, estrogen receptor positive (HCC) 04/16/2018   Essential hypertension 03/21/2015   Hyperlipidemia 03/21/2015   Genital herpes 03/21/2015   Fecal urgency 03/21/2015   GERD (gastroesophageal reflux disease) 03/21/2015    PCP: Alston Jerry, MD  REFERRING PROVIDER: Diedra Fowler, MD  REFERRING DIAG: S82.202D,S82.402D (ICD-10-CM) - Closed fracture  of left tibia and fibula with routine healing, subsequent encounter   THERAPY DIAG:  Muscle weakness (generalized)  Closed fracture of left tibia and fibula with routine healing, subsequent encounter  Impaired functional mobility, balance, gait, and endurance  Rationale for Evaluation and Treatment: Rehabilitation  ONSET DATE: 02/02/2023  SUBJECTIVE:   SUBJECTIVE STATEMENT: Pt reports no pain on this date. Ambulates into treatment with one crutch and  boot. Brought her shoe today to practice walking in it.   Pt had fall on the ice in Jan of 2025 and had ORIF in L tibia fibula. Had Home Health PT since then. With activity pt's pain gets 5/10.   PERTINENT HISTORY: Closed Fx of Left tibia and fibula Parkinson's Disease without Dyskinesia, without mentation of  fluctioantios Cerebral Aneurysm PAIN:  Are you having pain? No  PRECAUTIONS: Left CAM Boot-can transition to regular after a month in the boot  RED FLAGS: None   WEIGHT BEARING RESTRICTIONS: WBAT on LLE  FALLS:  Has patient fallen in last 6 months? Fall is MOI   PATIENT GOALS: "To be able to walk on my own with AD"  NEXT MD VISIT: in 3 months  OBJECTIVE:  Note: Objective measures were completed at Evaluation unless otherwise noted.  DIAGNOSTIC FINDINGS:   PATIENT SURVEYS:  LEFS 21/80 = 26.3%  COGNITION: Overall cognitive status: Within functional limits for tasks assessed     SENSATION: WFL  POSTURE: rounded shoulders and forward head  LOWER EXTREMITY ROM:  Active ROM Right eval Left eval  Hip flexion    Hip extension    Hip abduction    Hip adduction    Hip internal rotation    Hip external rotation    Knee flexion    Knee extension    Ankle dorsiflexion 15 A5  P8  Ankle plantarflexion    Ankle inversion WFL A2  P15  Ankle eversion WFL A10 20   (Blank rows = not tested)  LOWER EXTREMITY MMT:  MMT Right eval Left eval  Hip flexion    Hip extension    Hip abduction    Hip adduction    Hip internal rotation    Hip external rotation    Knee flexion    Knee extension 3+ 4+  Ankle dorsiflexion 2 4  Ankle plantarflexion 2+ 4  Ankle inversion 2+ 4  Ankle eversion 2+ 4   (Blank rows = not tested)   FUNCTIONAL TESTS:  30 Second Chair Stand Test: 5x  Norms:   Age 23-64 90-69 70-74 75-79 61-84 85-89 90-94  Women 15 15 14 13 12 11 9   Men 17 16 15 14 13 11 9    TUG: 47.96 seconds  GAIT: Distance walked: 13ft Assistive device utilized: Environmental consultant - 4 wheeled Level of assistance: SBA Comments: stepto pattern with RLE leading and then LLE  TREATMENT DATE:  05/29/23: Ambulation with normal shoe and one crutch, v cues  for sequencing 30ft-->75ft-->50ft Seated Heel Raises, 2x10 Seated toe raises, 2x10 Seated Fitter Board,  forward/backward 10x  Ankle circles, 5x clockwise, counterclockwise Ankle 4 way, YTB, 10x each way  05/22/23: Review of Goals and HEP STS, 2x10 Ankle pump, 2x10 Ankle circles, 2x10 each way  Seated DF stretch, 10x Toe scrunches with towel, 5" holdx10 Seated Fitter Board,  forward/backward 10x Medial/lateral 10x Gait training with one crutch and boot, 78ft, v cues for form  05/16/23  Evaluation  Seated ankle DF stretch Seated ankle pumps x 20 Seated ankle ABCs x 26   PATIENT EDUCATION:  Education details: PT Evaluation, findings, prognosis, frequency, attendance policy, and HEP. Person educated: Patient Education method: Medical illustrator Education comprehension: verbalized understanding  HOME EXERCISE PROGRAM: Access Code: [email or web address] URL: [email or web address] Date: 05/16/2023 Prepared by: Irene Mannheim  Exercises - Seated Ankle Dorsiflexion Stretch  - 1 x daily - 7 x weekly - 3 sets - 10 reps - Seated Ankle Pumps  - 1 x daily - 7 x weekly - 3 sets - 20 reps - Seated Ankle Circles  - 1 x daily - 7 x weekly - 3 sets - 26 reps - Sit to Stand with Counter Support  - 1 x daily - 7 x weekly - 3 sets - 10 reps  ASSESSMENT:  CLINICAL IMPRESSION: Patient tolerated session well on this date. Patient arrives using boot and one crutch but brings normal sneaker. Beginning of session spent focusing on ambulation with crutch while wearing normal shoe. With first trial, pt reports 1/10 pain in ankle joint. Visual and verbal cueing given for sequencing, Patient ambulates 79ft during second trial, reports 4/10 pain. Remainder of session spent with ankle mobility and strengthening in seated position. Patient encouraged to start wearing boot 50% of the day and normal shoe 50% of the day while still using single or two crutches, and sneakers only. Also encourage  to be mindful to pain rating during as to not push too far. Addition of resisted DF/PF/Ever/Inv with light resistance. Patient will benefit from continued skilled physical therapy in order to address ankle ROM, LE strength, and balance in order to improve gait and overall function.    Patient is a 73 y.o. female who was seen today for physical therapy evaluation and treatment for S82.202D,S82.402D (ICD-10-CM) - Closed fracture of left tibia and fibula with routine healing, subsequent encounter. Pt known to this clinic from previous PT POC for balance in setting of Parkinson's. Pt with recent ORIF in L ankle, is WBAT in boot and can wean off after 4/28. Pt with noticeable functional mobility deficits and limitations in ADLs and functional mobility due ot muscle weakness, ROM deficits, pain and outcome measures above. Pt will benefit from skilled Physical Therapy services to address deficits/limitations in order to improve functional and QOL.    OBJECTIVE IMPAIRMENTS: Abnormal gait, decreased activity tolerance, decreased balance, decreased mobility, difficulty walking, decreased ROM, decreased strength, hypomobility, postural dysfunction, and pain.   ACTIVITY LIMITATIONS: carrying, lifting, bending, standing, squatting, stairs, transfers, bed mobility, and locomotion level  PARTICIPATION LIMITATIONS: meal prep, driving, community activity, occupation, and yard work  PERSONAL FACTORS: Age and 1-2 comorbidities: Parkinson's are also affecting patient's functional outcome.   REHAB POTENTIAL: Good  CLINICAL DECISION MAKING: Stable/uncomplicated  EVALUATION COMPLEXITY: Low   GOALS: Goals reviewed with patient? Yes  SHORT TERM GOALS: Target date: 06/06/23  Pt will be independent with HEP in order  to demonstrate participation in Physical Therapy POC.  Baseline: Goal status: INITIAL  2.  Pt will 3/10 pain during mobility in order to demonstrate improved pain while performing ADLs.  Baseline:   Goal status: INITIAL  LONG TERM GOALS: Target date: 06/27/23  Pt will improve 30 Second Chair Stand Test by at least 2 reps in order to demonstrate improved functional strength to return to desired activities.  Baseline: see objective.  Goal status: INITIAL  2.  Pt will improve TUG by at least 15 seconds in order to demonstrate improved functional ambulatory capacity in community setting.  Baseline: see objective.  Goal status: INITIAL  3.  Pt will improve LEFS score by 20% in order to demonstrate improved pain with functional goals and outcomes. Baseline: see objective.  Goal status: INITIAL  4.  Pt will improve LLE ankle ROM by  at least 5 degrees in order to improve symmetry during functional activities.. Baseline: see objective.  Goal status: INITIAL  5.  Pt will improve BLE MMT at least 1/2 grade  in order to improve endurance, balance and tolerance during functional activities.. Baseline: see objective.  Goal status: INITIAL   PLAN:  PT FREQUENCY: 2x/week  PT DURATION: 6 weeks  PLANNED INTERVENTIONS: [id number]- PT Re-evaluation, 97750- Physical Performance Testing, 97110-Therapeutic exercises, 97530- Therapeutic activity, [id number]- Neuromuscular re-education, 97535- Self Care, [id number]- Manual therapy, [phone]- Gait training, [phone]- Electrical stimulation (unattended), [phone]- Electrical stimulation (manual), Patient/Family education, Balance training, Stair training, Joint mobilization, Joint manipulation, Spinal manipulation, Spinal mobilization, DME instructions, Cryotherapy, and Moist heat  PLAN FOR NEXT SESSION: Left ankle ROM, functional strengthening, inc WB activities out of boot to pt tolerance   3:10 PM, 05/29/23 Lindsy Cerullo Powell-Butler, PT, DPT [facility] [place]

## 2023-06-04 ENCOUNTER — Ambulatory Visit (HOSPITAL_COMMUNITY): Admitting: Physical Therapy

## 2023-06-04 DIAGNOSIS — Z7409 Other reduced mobility: Secondary | ICD-10-CM | POA: Diagnosis not present

## 2023-06-04 DIAGNOSIS — S82402D Unspecified fracture of shaft of left fibula, subsequent encounter for closed fracture with routine healing: Secondary | ICD-10-CM | POA: Diagnosis not present

## 2023-06-04 DIAGNOSIS — S82202D Unspecified fracture of shaft of left tibia, subsequent encounter for closed fracture with routine healing: Secondary | ICD-10-CM | POA: Diagnosis not present

## 2023-06-04 DIAGNOSIS — M6281 Muscle weakness (generalized): Secondary | ICD-10-CM

## 2023-06-04 NOTE — Therapy (Signed)
 OUTPATIENT PHYSICAL THERAPY LOWER EXTREMITY TREATMENT  Patient Name: Kimberly Mccormick MRN: [id number] DOB:1950-10-11, 73 y.o., female Today's Date: 06/04/2023  END OF SESSION:  PT End of Session - 06/04/23 1512     Visit Number 4    Number of Visits 12    Date for PT Re-Evaluation 06/27/23    Authorization Type [employer] Dual Complete    Authorization Time Period no auth; no limit    Progress Note Due on Visit 10    PT Start Time 1435    PT Stop Time 1515    PT Time Calculation (min) 40 min    Activity Tolerance Patient tolerated treatment well    Behavior During Therapy WFL for tasks assessed/performed              Past Medical History:  Diagnosis Date   Aneurysm of left internal carotid artery    Small supraclinoid 5 mm   Breast cancer (HCC)    Left s/p lumpectomy (05/06/2018) and adjuvant chemotherapy (06/23/2018) with Adriamycin and Cytoxan  x4 followed by Taxol weekly x12   DDD (degenerative disc disease), lumbar    Essential hypertension    Genital warts    GERD (gastroesophageal reflux disease)    History of anemia    History of renal insufficiency    Hypertension    Insomnia    Iron deficiency anemia    Ischemic stroke [facility])    December 2022   Mixed hyperlipidemia    Osteoarthritis    Parkinson's disease (HCC)    Peripheral neuropathy    Related to chemotherapy   Personal history of chemotherapy 2020   Past Surgical History:  Procedure Laterality Date   APPENDECTOMY  1966   BIOPSY  08/29/2020   Procedure: BIOPSY;  Surgeon: Urban Garden, MD;  [facility];  Service: Gastroenterology;;   BIOPSY  07/24/2022   Procedure: BIOPSY;  Surgeon: Urban Garden, MD;  [facility];  Service: Gastroenterology;;   BREAST LUMPECTOMY WITH RADIOACTIVE SEED AND SENTINEL LYMPH NODE BIOPSY Left 05/06/2018   Procedure: LEFT BREAST LUMPECTOMY WITH RADIOACTIVE SEED AND LEFT DEEP AXILLARY SENTINEL LYMPH NODE BIOPSY AND BLUE DYE INJECTION;   Surgeon: Boyce Byes, MD;  Location: [facility];  Service: General;  Laterality: Left;   CATARACT EXTRACTION W/PHACO Left 01/10/2014   Procedure: CATARACT EXTRACTION PHACO AND INTRAOCULAR LENS PLACEMENT ; CDE:  4.94;  Surgeon: Clay Cummins, MD;  Location: [facility];  Service: Ophthalmology;  Laterality: Left;   CESAREAN SECTION  1986   CHOLECYSTECTOMY     COLONOSCOPY WITH PROPOFOL  N/A 07/25/2020   Procedure: COLONOSCOPY WITH PROPOFOL ;  Surgeon: Urban Garden, MD;  [facility];  Service: Gastroenterology;  Laterality: N/A;  10:55   ESOPHAGOGASTRODUODENOSCOPY (EGD) WITH PROPOFOL  N/A 08/29/2020   Procedure: ESOPHAGOGASTRODUODENOSCOPY (EGD) WITH PROPOFOL ;  Surgeon: Urban Garden, MD;  [facility];  Service: Gastroenterology;  Laterality: N/A;  12:00   ESOPHAGOGASTRODUODENOSCOPY (EGD) WITH PROPOFOL  N/A 07/24/2022   Procedure: ESOPHAGOGASTRODUODENOSCOPY (EGD) WITH PROPOFOL ;  Surgeon: Urban Garden, MD;  [facility];  Service: Gastroenterology;  Laterality: N/A;  12:45;ASA 1-2   HOT HEMOSTASIS  07/24/2022   Procedure: HOT HEMOSTASIS (ARGON PLASMA COAGULATION/BICAP);  Surgeon: Umberto Ganong, Bearl Limes, MD;  [facility];  Service: Gastroenterology;;   IR 3D INDEPENDENT WKST  04/25/2021   IR ANGIO INTRA EXTRACRAN SEL COM CAROTID INNOMINATE UNI R MOD SED  04/25/2021   IR ANGIO INTRA EXTRACRAN SEL INTERNAL  CAROTID UNI L MOD SED  04/25/2021   IR ANGIOGRAM FOLLOW UP STUDY  04/25/2021   IR CT HEAD LTD  04/25/2021   IR NEURO EACH ADD'L AFTER BASIC UNI LEFT (MS)  04/25/2021   IR RADIOLOGIST EVAL & MGMT  03/22/2021   IR RADIOLOGIST EVAL & MGMT  03/28/2021   IR RADIOLOGIST EVAL & MGMT  05/15/2021   IR RADIOLOGIST EVAL & MGMT  09/28/2021   IR TRANSCATH/EMBOLIZ  04/25/2021   IR US  GUIDE VASC ACCESS RIGHT  04/25/2021   MYRINGOTOMY WITH TUBE PLACEMENT Right 02/04/2017   Procedure: REVISION OF RIGHT MYRINGOTOMY WITH TUBE PLACEMENT, WITH EXAM  OF LEFT EAR;  Surgeon: Reynold Caves, MD;  Location: [facility];  Service: ENT;  Laterality: Right;   NASAL SINUS SURGERY  2016   polypectomy   OPEN REDUCTION INTERNAL FIXATION (ORIF) TIBIA/FIBULA FRACTURE Left 02/02/2023   Procedure: OPEN REDUCTION INTERNAL FIXATION (ORIF) LEFT TIBIA/FIBULA FRACTURE;  Surgeon: Diedra Fowler, MD;  Location: [facility];  Service: Orthopedics;  Laterality: Left;   POLYPECTOMY  07/25/2020   Procedure: POLYPECTOMY;  Surgeon: Urban Garden, MD;  [facility];  Service: Gastroenterology;;   POLYPECTOMY  08/29/2020   Procedure: POLYPECTOMY;  Surgeon: Urban Garden, MD;  [facility];  Service: Gastroenterology;;   POLYPECTOMY  07/24/2022   Procedure: POLYPECTOMY;  Surgeon: Urban Garden, MD;  [facility];  Service: Gastroenterology;;   [facility] REMOVAL N/A 01/06/2019   Procedure: REMOVAL PORT-A-CATH;  Surgeon: Boyce Byes, MD;  Location: [facility];  Service: General;  Laterality: N/A;   PORTACATH PLACEMENT Right 06/16/2018   Procedure: INSERTION PORT-A-CATH;  Surgeon: Boyce Byes, MD;  Location: [facility];  Service: General;  Laterality: Right;   RADIOLOGY WITH ANESTHESIA N/A 04/25/2021   Procedure: IR WITH ANESTHESIA EMBOLIZATION;  Surgeon: Luellen Sages, MD;  Location: [facility];  Service: Radiology;  Laterality: N/A;   SCLEROTHERAPY  07/24/2022   Procedure: SCLEROTHERAPY;  Surgeon: Umberto Ganong, Bearl Limes, MD;  [facility];  Service: Gastroenterology;;   [place] LIFTING INJECTION  08/29/2020   Procedure: SUBMUCOSAL LIFTING INJECTION;  Surgeon: Umberto Ganong, Bearl Limes, MD;  [facility];  Service: Gastroenterology;;   Jona Negro INJECTION  07/24/2022   Procedure: SUBMUCOSAL LIFTING INJECTION;  Surgeon: Urban Garden, MD;  [facility];  Service: Gastroenterology;;   TONSILLECTOMY  1970   Patient  Active Problem List   Diagnosis Date Noted   Closed fracture of left fibula and tibia 02/02/2023   Acute diarrhea 12/23/2022   Functional dyspepsia 07/01/2022   DOE (dyspnea on exertion) 01/15/2022   Nausea with vomiting 05/28/2021   Duodenal adenoma 05/28/2021   Brain aneurysm 04/25/2021   History of herpes simplex infection 01/08/2021   Papanicolaou smear, as part of routine gynecological examination 01/08/2021   CVA (cerebral vascular accident) (HCC) 01/04/2021   Acute ischemic stroke (HCC) 01/03/2021   Hypokalemia 01/03/2021   Cerebral aneurysm 01/03/2021   Musculoskeletal pain 01/03/2021   Early satiety 11/27/2020   Chronic abdominal pain 07/13/2020   Port-A-Cath in place 06/23/2018   Malignant neoplasm of upper-inner quadrant of left breast in female, estrogen receptor positive (HCC) 04/16/2018   Essential hypertension 03/21/2015   Hyperlipidemia 03/21/2015   Genital herpes 03/21/2015   Fecal urgency 03/21/2015   GERD (gastroesophageal reflux disease) 03/21/2015    PCP: Alston Jerry, MD  REFERRING PROVIDER: Diedra Fowler, MD  REFERRING DIAG: S82.202D,S82.402D (ICD-10-CM) - Closed fracture  of left tibia and fibula with routine healing, subsequent encounter   THERAPY DIAG:  No diagnosis found.  Rationale for Evaluation and Treatment: Rehabilitation  ONSET DATE: 02/02/2023  SUBJECTIVE:   SUBJECTIVE STATEMENT: Pt reports currently a 4/10 pain in ankle.  Brought her shoe but has no laces in it. Continues to use axillary crutch with gait.  Pt reports wearing her reg shoe 50% of time at home now.  Pt had fall on the ice in Jan of 2025 and had ORIF in L tibia fibula. Had Home Health PT since then. With activity pt's pain gets 5/10.   PERTINENT HISTORY: Closed Fx of Left tibia and fibula Parkinson's Disease without Dyskinesia, without mentation of fluctioantios Cerebral Aneurysm PAIN:  Are you having pain? No  PRECAUTIONS: Left CAM Boot-can transition to  regular after a month in the boot  RED FLAGS: None   WEIGHT BEARING RESTRICTIONS: WBAT on LLE  FALLS:  Has patient fallen in last 6 months? Fall is MOI   PATIENT GOALS: "To be able to walk on my own with AD"  NEXT MD VISIT: in 3 months  OBJECTIVE:  Note: Objective measures were completed at Evaluation unless otherwise noted.  DIAGNOSTIC FINDINGS:   PATIENT SURVEYS:  LEFS 21/80 = 26.3%  COGNITION: Overall cognitive status: Within functional limits for tasks assessed     SENSATION: WFL  POSTURE: rounded shoulders and forward head  LOWER EXTREMITY ROM:  Active ROM Right eval Left eval  Hip flexion    Hip extension    Hip abduction    Hip adduction    Hip internal rotation    Hip external rotation    Knee flexion    Knee extension    Ankle dorsiflexion 15 A5  P8  Ankle plantarflexion    Ankle inversion WFL A2  P15  Ankle eversion WFL A10 20   (Blank rows = not tested)  LOWER EXTREMITY MMT:  MMT Right eval Left eval  Hip flexion    Hip extension    Hip abduction    Hip adduction    Hip internal rotation    Hip external rotation    Knee flexion    Knee extension 3+ 4+  Ankle dorsiflexion 2 4  Ankle plantarflexion 2+ 4  Ankle inversion 2+ 4  Ankle eversion 2+ 4   (Blank rows = not tested)   FUNCTIONAL TESTS:  30 Second Chair Stand Test: 5x  Norms:   Age 66-64 3-69 70-74 75-79 80-84 85-89 90-94  Women 15 15 14 13 12 11 9   Men 17 16 15 14 13 11 9    TUG: 47.96 seconds  GAIT: Distance walked: 43ft Assistive device utilized: Environmental consultant - 4 wheeled Level of assistance: SBA Comments: stepto pattern with RLE leading and then LLE  TREATMENT DATE:  06/04/23 Gait with shoe on Lt and crutch 120 foot Standing at // bars with bil UE assist  Heel raises 20X  Rockerboard Lt/rt 2 minutes  Rockerboard fwd/bkwd 2  minutes  Slant board stretch 3X30" Seated:  toe raises 20X  Windshield wiper with towel 2 minutes   05/29/23: Ambulation with normal shoe and one crutch, v cues for sequencing 65ft-->75ft-->50ft Seated Heel Raises, 2x10 Seated toe raises, 2x10 Seated Fitter Board,  forward/backward 10x  Ankle circles, 5x clockwise, counterclockwise Ankle 4 way, YTB, 10x each way  05/22/23: Review of Goals and HEP STS, 2x10 Ankle pump, 2x10 Ankle circles, 2x10 each way  Seated DF stretch, 10x Toe scrunches with towel, 5" holdx10 Seated Fitter Board,  forward/backward 10x Medial/lateral 10x Gait training with one crutch and boot, 79ft, v cues for form  05/16/23  Evaluation  Seated ankle DF stretch Seated ankle pumps x 20 Seated ankle ABCs x 26   PATIENT EDUCATION:  Education details: PT Evaluation, findings, prognosis, frequency, attendance policy, and HEP. Person educated: Patient Education method: Medical illustrator Education comprehension: verbalized understanding  HOME EXERCISE PROGRAM: Access Code: [email or web address] URL: [email or web address] Date: 05/16/2023 Prepared by: Irene Mannheim  Exercises - Seated Ankle Dorsiflexion Stretch  - 1 x daily - 7 x weekly - 3 sets - 10 reps - Seated Ankle Pumps  - 1 x daily - 7 x weekly - 3 sets - 20 reps - Seated Ankle Circles  - 1 x daily - 7 x weekly - 3 sets - 26 reps - Sit to Stand with Counter Support  - 1 x daily - 7 x weekly - 3 sets - 10 reps  ASSESSMENT:  CLINICAL IMPRESSION: Adjusted hand piece on crutch to give slight bend in elbow for leverage and stabilization.   Worked on gait first using step through gait rather than step-to.  Pt reported no increased pain with wearing shoe vs boot with ambulation today, however still has notable edema in Lt foot.  Progressed to standing exercises working on shifting weight to Lt and stabilize.   Slant board stretch added with extreme tightness noted in Lt gastroc.  Pt fatigued  easily requiring several seated rests throughout session today.  Visual and verbal cueing given for sequencing. .Patient will benefit from continued skilled physical therapy in order to address ankle ROM, LE strength, and balance in order to improve gait and overall function.    Patient is a 73 y.o. female who was seen today for physical therapy evaluation and treatment for S82.202D,S82.402D (ICD-10-CM) - Closed fracture of left tibia and fibula with routine healing, subsequent encounter. Pt known to this clinic from previous PT POC for balance in setting of Parkinson's. Pt with recent ORIF in L ankle, is WBAT in boot and can wean off after 4/28. Pt with noticeable functional mobility deficits and limitations in ADLs and functional mobility due ot muscle weakness, ROM deficits, pain and outcome measures above. Pt will benefit from skilled Physical Therapy services to address deficits/limitations in order to improve functional and QOL.    OBJECTIVE IMPAIRMENTS: Abnormal gait, decreased activity tolerance, decreased balance, decreased mobility, difficulty walking, decreased ROM, decreased strength, hypomobility, postural dysfunction, and pain.   ACTIVITY LIMITATIONS: carrying, lifting, bending, standing, squatting, stairs, transfers, bed mobility, and locomotion level  PARTICIPATION LIMITATIONS: meal prep, driving, community activity, occupation, and yard work  PERSONAL FACTORS: Age and 1-2 comorbidities: Parkinson's are also affecting patient's functional outcome.   REHAB POTENTIAL: Good  CLINICAL DECISION MAKING: Stable/uncomplicated  EVALUATION COMPLEXITY: Low   GOALS: Goals reviewed with patient? Yes  SHORT TERM GOALS: Target date: 06/06/23  Pt will be independent with HEP in order to demonstrate participation in Physical Therapy POC.  Baseline: Goal status: INITIAL  2.  Pt will 3/10 pain during mobility in order to demonstrate improved pain while performing ADLs.  Baseline:  Goal  status: INITIAL  LONG TERM GOALS: Target date: 06/27/23  Pt will improve 30 Second Chair Stand Test by at least 2 reps in order to demonstrate improved functional strength to return to desired activities.  Baseline: see objective.  Goal status: INITIAL  2.  Pt will improve TUG by at least 15 seconds in order to demonstrate improved functional ambulatory capacity in community setting.  Baseline: see objective.  Goal status: INITIAL  3.  Pt will improve LEFS score by 20% in order to demonstrate improved pain with functional goals and outcomes. Baseline: see objective.  Goal status: INITIAL  4.  Pt will improve LLE ankle ROM by  at least 5 degrees in order to improve symmetry during functional activities.. Baseline: see objective.  Goal status: INITIAL  5.  Pt will improve BLE MMT at least 1/2 grade  in order to improve endurance, balance and tolerance during functional activities.. Baseline: see objective.  Goal status: INITIAL   PLAN:  PT FREQUENCY: 2x/week  PT DURATION: 6 weeks  PLANNED INTERVENTIONS: 29562- PT Re-evaluation, 97750- Physical Performance Testing, 97110-Therapeutic exercises, 97530- Therapeutic activity, [id number]- Neuromuscular re-education, 97535- Self Care, 13086- Manual therapy, [phone]- Gait training, [phone]- Electrical stimulation (unattended), [phone]- Electrical stimulation (manual), Patient/Family education, Balance training, Stair training, Joint mobilization, Joint manipulation, Spinal manipulation, Spinal mobilization, DME instructions, Cryotherapy, and Moist heat  PLAN FOR NEXT SESSION: Left ankle ROM, functional strengthening, inc WB activities out of boot to pt tolerance   3:12 PM, 06/04/23 Tamia Powell-Butler, PT, DPT [facility] [place]

## 2023-06-05 DIAGNOSIS — D132 Benign neoplasm of duodenum: Secondary | ICD-10-CM | POA: Diagnosis not present

## 2023-06-05 DIAGNOSIS — K30 Functional dyspepsia: Secondary | ICD-10-CM | POA: Diagnosis not present

## 2023-06-05 DIAGNOSIS — S82202A Unspecified fracture of shaft of left tibia, initial encounter for closed fracture: Secondary | ICD-10-CM | POA: Diagnosis not present

## 2023-06-06 ENCOUNTER — Encounter (HOSPITAL_COMMUNITY): Payer: Self-pay

## 2023-06-06 ENCOUNTER — Ambulatory Visit (HOSPITAL_COMMUNITY)

## 2023-06-06 DIAGNOSIS — S82202D Unspecified fracture of shaft of left tibia, subsequent encounter for closed fracture with routine healing: Secondary | ICD-10-CM

## 2023-06-06 DIAGNOSIS — Z7409 Other reduced mobility: Secondary | ICD-10-CM | POA: Diagnosis not present

## 2023-06-06 DIAGNOSIS — M6281 Muscle weakness (generalized): Secondary | ICD-10-CM | POA: Diagnosis not present

## 2023-06-06 DIAGNOSIS — S82402D Unspecified fracture of shaft of left fibula, subsequent encounter for closed fracture with routine healing: Secondary | ICD-10-CM | POA: Diagnosis not present

## 2023-06-06 NOTE — Therapy (Signed)
 OUTPATIENT PHYSICAL THERAPY LOWER EXTREMITY TREATMENT  Patient Name: Kimberly Mccormick MRN: [id number] DOB:November 23, 1950, 73 y.o., female Today's Date: 06/06/2023  END OF SESSION:  PT End of Session - 06/06/23 1333     Visit Number 5    Number of Visits 12    Date for PT Re-Evaluation 06/27/23    Authorization Type [employer] Dual Complete    Authorization Time Period no auth; no limit    Progress Note Due on Visit 10    PT Start Time 1335    PT Stop Time 1420    PT Time Calculation (min) 45 min    Activity Tolerance Patient tolerated treatment well    Behavior During Therapy WFL for tasks assessed/performed              Past Medical History:  Diagnosis Date   Aneurysm of left internal carotid artery    Small supraclinoid 5 mm   Breast cancer (HCC)    Left s/p lumpectomy (05/06/2018) and adjuvant chemotherapy (06/23/2018) with Adriamycin and Cytoxan  x4 followed by Taxol weekly x12   DDD (degenerative disc disease), lumbar    Essential hypertension    Genital warts    GERD (gastroesophageal reflux disease)    History of anemia    History of renal insufficiency    Hypertension    Insomnia    Iron deficiency anemia    Ischemic stroke [facility])    December 2022   Mixed hyperlipidemia    Osteoarthritis    Parkinson's disease (HCC)    Peripheral neuropathy    Related to chemotherapy   Personal history of chemotherapy 2020   Past Surgical History:  Procedure Laterality Date   APPENDECTOMY  1966   BIOPSY  08/29/2020   Procedure: BIOPSY;  Surgeon: Urban Garden, MD;  [facility];  Service: Gastroenterology;;   BIOPSY  07/24/2022   Procedure: BIOPSY;  Surgeon: Urban Garden, MD;  [facility];  Service: Gastroenterology;;   BREAST LUMPECTOMY WITH RADIOACTIVE SEED AND SENTINEL LYMPH NODE BIOPSY Left 05/06/2018   Procedure: LEFT BREAST LUMPECTOMY WITH RADIOACTIVE SEED AND LEFT DEEP AXILLARY SENTINEL LYMPH NODE BIOPSY AND BLUE DYE INJECTION;   Surgeon: Boyce Byes, MD;  Location: [facility];  Service: General;  Laterality: Left;   CATARACT EXTRACTION W/PHACO Left 01/10/2014   Procedure: CATARACT EXTRACTION PHACO AND INTRAOCULAR LENS PLACEMENT ; CDE:  4.94;  Surgeon: Clay Cummins, MD;  Location: [facility];  Service: Ophthalmology;  Laterality: Left;   CESAREAN SECTION  1986   CHOLECYSTECTOMY     COLONOSCOPY WITH PROPOFOL  N/A 07/25/2020   Procedure: COLONOSCOPY WITH PROPOFOL ;  Surgeon: Urban Garden, MD;  [facility];  Service: Gastroenterology;  Laterality: N/A;  10:55   ESOPHAGOGASTRODUODENOSCOPY (EGD) WITH PROPOFOL  N/A 08/29/2020   Procedure: ESOPHAGOGASTRODUODENOSCOPY (EGD) WITH PROPOFOL ;  Surgeon: Urban Garden, MD;  [facility];  Service: Gastroenterology;  Laterality: N/A;  12:00   ESOPHAGOGASTRODUODENOSCOPY (EGD) WITH PROPOFOL  N/A 07/24/2022   Procedure: ESOPHAGOGASTRODUODENOSCOPY (EGD) WITH PROPOFOL ;  Surgeon: Urban Garden, MD;  [facility];  Service: Gastroenterology;  Laterality: N/A;  12:45;ASA 1-2   HOT HEMOSTASIS  07/24/2022   Procedure: HOT HEMOSTASIS (ARGON PLASMA COAGULATION/BICAP);  Surgeon: Umberto Ganong, Bearl Limes, MD;  [facility];  Service: Gastroenterology;;   IR 3D INDEPENDENT WKST  04/25/2021   IR ANGIO INTRA EXTRACRAN SEL COM CAROTID INNOMINATE UNI R MOD SED  04/25/2021   IR ANGIO INTRA EXTRACRAN SEL INTERNAL  CAROTID UNI L MOD SED  04/25/2021   IR ANGIOGRAM FOLLOW UP STUDY  04/25/2021   IR CT HEAD LTD  04/25/2021   IR NEURO EACH ADD'L AFTER BASIC UNI LEFT (MS)  04/25/2021   IR RADIOLOGIST EVAL & MGMT  03/22/2021   IR RADIOLOGIST EVAL & MGMT  03/28/2021   IR RADIOLOGIST EVAL & MGMT  05/15/2021   IR RADIOLOGIST EVAL & MGMT  09/28/2021   IR TRANSCATH/EMBOLIZ  04/25/2021   IR US  GUIDE VASC ACCESS RIGHT  04/25/2021   MYRINGOTOMY WITH TUBE PLACEMENT Right 02/04/2017   Procedure: REVISION OF RIGHT MYRINGOTOMY WITH TUBE PLACEMENT, WITH EXAM  OF LEFT EAR;  Surgeon: Reynold Caves, MD;  Location: [facility];  Service: ENT;  Laterality: Right;   NASAL SINUS SURGERY  2016   polypectomy   OPEN REDUCTION INTERNAL FIXATION (ORIF) TIBIA/FIBULA FRACTURE Left 02/02/2023   Procedure: OPEN REDUCTION INTERNAL FIXATION (ORIF) LEFT TIBIA/FIBULA FRACTURE;  Surgeon: Diedra Fowler, MD;  Location: [facility];  Service: Orthopedics;  Laterality: Left;   POLYPECTOMY  07/25/2020   Procedure: POLYPECTOMY;  Surgeon: Urban Garden, MD;  [facility];  Service: Gastroenterology;;   POLYPECTOMY  08/29/2020   Procedure: POLYPECTOMY;  Surgeon: Urban Garden, MD;  [facility];  Service: Gastroenterology;;   POLYPECTOMY  07/24/2022   Procedure: POLYPECTOMY;  Surgeon: Urban Garden, MD;  [facility];  Service: Gastroenterology;;   [facility] REMOVAL N/A 01/06/2019   Procedure: REMOVAL PORT-A-CATH;  Surgeon: Boyce Byes, MD;  Location: [facility];  Service: General;  Laterality: N/A;   PORTACATH PLACEMENT Right 06/16/2018   Procedure: INSERTION PORT-A-CATH;  Surgeon: Boyce Byes, MD;  Location: [facility];  Service: General;  Laterality: Right;   RADIOLOGY WITH ANESTHESIA N/A 04/25/2021   Procedure: IR WITH ANESTHESIA EMBOLIZATION;  Surgeon: Luellen Sages, MD;  Location: [facility];  Service: Radiology;  Laterality: N/A;   SCLEROTHERAPY  07/24/2022   Procedure: SCLEROTHERAPY;  Surgeon: Umberto Ganong, Bearl Limes, MD;  [facility];  Service: Gastroenterology;;   [place] LIFTING INJECTION  08/29/2020   Procedure: SUBMUCOSAL LIFTING INJECTION;  Surgeon: Umberto Ganong, Bearl Limes, MD;  [facility];  Service: Gastroenterology;;   Jona Negro INJECTION  07/24/2022   Procedure: SUBMUCOSAL LIFTING INJECTION;  Surgeon: Urban Garden, MD;  [facility];  Service: Gastroenterology;;   TONSILLECTOMY  1970   Patient  Active Problem List   Diagnosis Date Noted   Closed fracture of left fibula and tibia 02/02/2023   Acute diarrhea 12/23/2022   Functional dyspepsia 07/01/2022   DOE (dyspnea on exertion) 01/15/2022   Nausea with vomiting 05/28/2021   Duodenal adenoma 05/28/2021   Brain aneurysm 04/25/2021   History of herpes simplex infection 01/08/2021   Papanicolaou smear, as part of routine gynecological examination 01/08/2021   CVA (cerebral vascular accident) (HCC) 01/04/2021   Acute ischemic stroke (HCC) 01/03/2021   Hypokalemia 01/03/2021   Cerebral aneurysm 01/03/2021   Musculoskeletal pain 01/03/2021   Early satiety 11/27/2020   Chronic abdominal pain 07/13/2020   Port-A-Cath in place 06/23/2018   Malignant neoplasm of upper-inner quadrant of left breast in female, estrogen receptor positive (HCC) 04/16/2018   Essential hypertension 03/21/2015   Hyperlipidemia 03/21/2015   Genital herpes 03/21/2015   Fecal urgency 03/21/2015   GERD (gastroesophageal reflux disease) 03/21/2015    PCP: Alston Jerry, MD  REFERRING PROVIDER: Diedra Fowler, MD  REFERRING DIAG: S82.202D,S82.402D (ICD-10-CM) - Closed fracture  of left tibia and fibula with routine healing, subsequent encounter   THERAPY DIAG:  Muscle weakness (generalized)  Closed fracture of left tibia and fibula with routine healing, subsequent encounter  Impaired functional mobility, balance, gait, and endurance  Rationale for Evaluation and Treatment: Rehabilitation  ONSET DATE: 02/02/2023  SUBJECTIVE:   SUBJECTIVE STATEMENT: Pt reports 3/10 pain today. Reports she tried to walk more in her regular shoe but inc pain in anterior part of ankle.   Pt had fall on the ice in Jan of 2025 and had ORIF in L tibia fibula. Had Home Health PT since then. With activity pt's pain gets 5/10.   PERTINENT HISTORY: Closed Fx of Left tibia and fibula Parkinson's Disease without Dyskinesia, without mentation of fluctioantios Cerebral  Aneurysm PAIN:  Are you having pain? No  PRECAUTIONS: Left CAM Boot-can transition to regular after a month in the boot  RED FLAGS: None   WEIGHT BEARING RESTRICTIONS: WBAT on LLE  FALLS:  Has patient fallen in last 6 months? Fall is MOI   PATIENT GOALS: "To be able to walk on my own with AD"  NEXT MD VISIT: in 3 months  OBJECTIVE:  Note: Objective measures were completed at Evaluation unless otherwise noted.  DIAGNOSTIC FINDINGS:   PATIENT SURVEYS:  LEFS 21/80 = 26.3%  COGNITION: Overall cognitive status: Within functional limits for tasks assessed     SENSATION: WFL  POSTURE: rounded shoulders and forward head  LOWER EXTREMITY ROM:  Active ROM Right eval Left eval  Hip flexion    Hip extension    Hip abduction    Hip adduction    Hip internal rotation    Hip external rotation    Knee flexion    Knee extension    Ankle dorsiflexion 15 A5  P8  Ankle plantarflexion    Ankle inversion WFL A2  P15  Ankle eversion WFL A10 20   (Blank rows = not tested)  LOWER EXTREMITY MMT:  MMT Right eval Left eval  Hip flexion    Hip extension    Hip abduction    Hip adduction    Hip internal rotation    Hip external rotation    Knee flexion    Knee extension 3+ 4+  Ankle dorsiflexion 2 4  Ankle plantarflexion 2+ 4  Ankle inversion 2+ 4  Ankle eversion 2+ 4   (Blank rows = not tested)   FUNCTIONAL TESTS:  30 Second Chair Stand Test: 5x  Norms:   Age 68-64 27-69 32-74 75-79 60-84 85-89 90-94  Women 15 15 14 13 12 11 9   Men 17 16 15 14 13 11 9    TUG: 47.96 seconds  GAIT: Distance walked: 73ft Assistive device utilized: Environmental consultant - 4 wheeled Level of assistance: SBA Comments: stepto pattern with RLE leading and then LLE  TREATMENT DATE:  06/06/23: Ambulation, 75ftx2, in shoes, no AD, v cues for step through and heel  strike  Ambulation with SPC in shoes, 31ft, v cues for the above and inc gait speed as to combat impaired balance Standing:  Heel raises: 2x10  Toes raises on decline, 2x10 Step up with LLE only, 4 inch step, w/ UE support, 2x10 STM to L gastroc muscle, pt seated, ~5' Slant board stretch 2X30"  06/04/23 Gait with shoe on Lt and crutch 120 foot Standing at // bars with bil UE assist  Heel raises 20X  Rockerboard Lt/rt 2 minutes  Rockerboard fwd/bkwd 2 minutes  Slant board stretch 3X30" Seated:  toe raises 20X  Windshield wiper with towel 2 minutes   05/29/23: Ambulation with normal shoe and one crutch, v cues for sequencing 55ft-->75ft-->50ft Seated Heel Raises, 2x10 Seated toe raises, 2x10 Seated Fitter Board,  forward/backward 10x  Ankle circles, 5x clockwise, counterclockwise Ankle 4 way, YTB, 10x each way     PATIENT EDUCATION:  Education details: PT Evaluation, findings, prognosis, frequency, attendance policy, and HEP. Person educated: Patient Education method: Medical illustrator Education comprehension: verbalized understanding  HOME EXERCISE PROGRAM: Access Code: [email or web address] URL: [email or web address] Date: 05/16/2023 Prepared by: Irene Mannheim  Exercises - Seated Ankle Dorsiflexion Stretch  - 1 x daily - 7 x weekly - 3 sets - 10 reps - Seated Ankle Pumps  - 1 x daily - 7 x weekly - 3 sets - 20 reps - Seated Ankle Circles  - 1 x daily - 7 x weekly - 3 sets - 26 reps - Sit to Stand with Counter Support  - 1 x daily - 7 x weekly - 3 sets - 10 reps  ASSESSMENT:  CLINICAL IMPRESSION: Patient reports pain at 3/10 pain at start of session. During first ambulation trial, pain increases to 5/10. Patient reports still having increased pain when ambulating outside of clinic in shoe, attempted to walk to church. Patient instructed to ambulate in shoe half of the day, in home only, and continue to wear boot when walking outside home due to  increased pain for her safety. Next trial with SPC and in shoe. Patient steady t/o. Reports she's tried walking with it a little at home but has to be very careful. Patient instructed to graduate from crutch and use personal [facility] for ambulation outside of clinic. Will bring hers next session. Found her order to compression socks and will go get them ordered today. Mild edema noted. Remainder of session spent with ankle mobility and strengthening in standing position. STM to L gastoc muscle and stretching at EOS. Patient will benefit from continued skilled physical therapy in order to address ankle ROM, LE strength, and balance in order to improve gait and overall function.    Patient is a 73 y.o. female who was seen today for physical therapy evaluation and treatment for S82.202D,S82.402D (ICD-10-CM) - Closed fracture of left tibia and fibula with routine healing, subsequent encounter. Pt known to this clinic from previous PT POC for balance in setting of Parkinson's. Pt with recent ORIF in L ankle, is WBAT in boot and can wean off after 4/28. Pt with noticeable functional mobility deficits and limitations in ADLs and functional mobility due ot muscle weakness, ROM deficits, pain and outcome measures above. Pt will benefit from skilled Physical Therapy services to address deficits/limitations in order to improve functional and QOL.    OBJECTIVE IMPAIRMENTS: Abnormal gait, decreased activity tolerance, decreased balance, decreased mobility, difficulty walking, decreased  ROM, decreased strength, hypomobility, postural dysfunction, and pain.   ACTIVITY LIMITATIONS: carrying, lifting, bending, standing, squatting, stairs, transfers, bed mobility, and locomotion level  PARTICIPATION LIMITATIONS: meal prep, driving, community activity, occupation, and yard work  PERSONAL FACTORS: Age and 1-2 comorbidities: Parkinson's are also affecting patient's functional outcome.   REHAB POTENTIAL: Good  CLINICAL DECISION  MAKING: Stable/uncomplicated  EVALUATION COMPLEXITY: Low   GOALS: Goals reviewed with patient? Yes  SHORT TERM GOALS: Target date: 06/06/23  Pt will be independent with HEP in order to demonstrate participation in Physical Therapy POC.  Baseline: Goal status: INITIAL  2.  Pt will 3/10 pain during mobility in order to demonstrate improved pain while performing ADLs.  Baseline:  Goal status: INITIAL  LONG TERM GOALS: Target date: 06/27/23  Pt will improve 30 Second Chair Stand Test by at least 2 reps in order to demonstrate improved functional strength to return to desired activities.  Baseline: see objective.  Goal status: INITIAL  2.  Pt will improve TUG by at least 15 seconds in order to demonstrate improved functional ambulatory capacity in community setting.  Baseline: see objective.  Goal status: INITIAL  3.  Pt will improve LEFS score by 20% in order to demonstrate improved pain with functional goals and outcomes. Baseline: see objective.  Goal status: INITIAL  4.  Pt will improve LLE ankle ROM by  at least 5 degrees in order to improve symmetry during functional activities.. Baseline: see objective.  Goal status: INITIAL  5.  Pt will improve BLE MMT at least 1/2 grade  in order to improve endurance, balance and tolerance during functional activities.. Baseline: see objective.  Goal status: INITIAL   PLAN:  PT FREQUENCY: 2x/week  PT DURATION: 6 weeks  PLANNED INTERVENTIONS: 11914- PT Re-evaluation, 97750- Physical Performance Testing, 97110-Therapeutic exercises, 97530- Therapeutic activity, [id number]- Neuromuscular re-education, 97535- Self Care, [id number]- Manual therapy, [phone]- Gait training, [phone]- Electrical stimulation (unattended), [phone]- Electrical stimulation (manual), Patient/Family education, Balance training, Stair training, Joint mobilization, Joint manipulation, Spinal manipulation, Spinal mobilization, DME instructions, Cryotherapy, and Moist heat  PLAN FOR  NEXT SESSION: Left ankle ROM, functional strengthening, inc WB activities out of boot to pt tolerance   2:25 PM, 06/06/23 Halee Glynn Powell-Butler, PT, DPT [facility] [place]

## 2023-06-11 ENCOUNTER — Encounter (HOSPITAL_COMMUNITY): Payer: Self-pay

## 2023-06-11 ENCOUNTER — Ambulatory Visit (HOSPITAL_COMMUNITY)

## 2023-06-11 DIAGNOSIS — Z7409 Other reduced mobility: Secondary | ICD-10-CM

## 2023-06-11 DIAGNOSIS — M6281 Muscle weakness (generalized): Secondary | ICD-10-CM

## 2023-06-11 DIAGNOSIS — S82402D Unspecified fracture of shaft of left fibula, subsequent encounter for closed fracture with routine healing: Secondary | ICD-10-CM | POA: Diagnosis not present

## 2023-06-11 DIAGNOSIS — S82202D Unspecified fracture of shaft of left tibia, subsequent encounter for closed fracture with routine healing: Secondary | ICD-10-CM | POA: Diagnosis not present

## 2023-06-11 NOTE — Therapy (Signed)
 OUTPATIENT PHYSICAL THERAPY LOWER EXTREMITY TREATMENT  Patient Name: Kimberly Mccormick MRN: [id number] DOB:1950-07-18, 73 y.o., female Today's Date: 06/11/2023  END OF SESSION:  PT End of Session - 06/11/23 1101     Visit Number 6    Number of Visits 12    Date for PT Re-Evaluation 06/27/23    Authorization Type [employer] Dual Complete    Authorization Time Period no auth; no limit    Progress Note Due on Visit 10    PT Start Time 1102    PT Stop Time 1145    PT Time Calculation (min) 43 min    Equipment Utilized During Treatment Gait belt    Activity Tolerance Patient tolerated treatment well    Behavior During Therapy WFL for tasks assessed/performed              Past Medical History:  Diagnosis Date   Aneurysm of left internal carotid artery    Small supraclinoid 5 mm   Breast cancer (HCC)    Left s/p lumpectomy (05/06/2018) and adjuvant chemotherapy (06/23/2018) with Adriamycin and Cytoxan  x4 followed by Taxol weekly x12   DDD (degenerative disc disease), lumbar    Essential hypertension    Genital warts    GERD (gastroesophageal reflux disease)    History of anemia    History of renal insufficiency    Hypertension    Insomnia    Iron deficiency anemia    Ischemic stroke [facility])    December 2022   Mixed hyperlipidemia    Osteoarthritis    Parkinson's disease (HCC)    Peripheral neuropathy    Related to chemotherapy   Personal history of chemotherapy 2020   Past Surgical History:  Procedure Laterality Date   APPENDECTOMY  1966   BIOPSY  08/29/2020   Procedure: BIOPSY;  Surgeon: Urban Garden, MD;  Location: [facility];  Service: Gastroenterology;;   BIOPSY  07/24/2022   Procedure: BIOPSY;  Surgeon: Urban Garden, MD;  Location: [facility];  Service: Gastroenterology;;   BREAST LUMPECTOMY WITH RADIOACTIVE SEED AND SENTINEL LYMPH NODE BIOPSY Left 05/06/2018   Procedure: LEFT BREAST LUMPECTOMY WITH RADIOACTIVE SEED AND LEFT DEEP AXILLARY  SENTINEL LYMPH NODE BIOPSY AND BLUE DYE INJECTION;  Surgeon: Boyce Byes, MD;  Location: [facility];  Service: General;  Laterality: Left;   CATARACT EXTRACTION W/PHACO Left 01/10/2014   Procedure: CATARACT EXTRACTION PHACO AND INTRAOCULAR LENS PLACEMENT ; CDE:  4.94;  Surgeon: Clay Cummins, MD;  Location: [facility];  Service: Ophthalmology;  Laterality: Left;   CESAREAN SECTION  1986   CHOLECYSTECTOMY     COLONOSCOPY WITH PROPOFOL  N/A 07/25/2020   Procedure: COLONOSCOPY WITH PROPOFOL ;  Surgeon: Urban Garden, MD;  [facility]: [facility];  Service: Gastroenterology;  Laterality: N/A;  10:55   ESOPHAGOGASTRODUODENOSCOPY (EGD) WITH PROPOFOL  N/A 08/29/2020   Procedure: ESOPHAGOGASTRODUODENOSCOPY (EGD) WITH PROPOFOL ;  Surgeon: Urban Garden, MD;  [facility];  Service: Gastroenterology;  Laterality: N/A;  12:00   ESOPHAGOGASTRODUODENOSCOPY (EGD) WITH PROPOFOL  N/A 07/24/2022   Procedure: ESOPHAGOGASTRODUODENOSCOPY (EGD) WITH PROPOFOL ;  Surgeon: Urban Garden, MD;  [facility];  Service: Gastroenterology;  Laterality: N/A;  12:45;ASA 1-2   HOT HEMOSTASIS  07/24/2022   Procedure: HOT HEMOSTASIS (ARGON PLASMA COAGULATION/BICAP);  Surgeon: Umberto Ganong, Bearl Limes, MD;  [facility]: [facility];  Service: Gastroenterology;;   IR 3D INDEPENDENT WKST  04/25/2021   IR ANGIO INTRA EXTRACRAN SEL COM CAROTID INNOMINATE UNI R MOD SED  04/25/2021   IR ANGIO INTRA EXTRACRAN SEL INTERNAL CAROTID UNI L MOD SED  04/25/2021   IR ANGIOGRAM FOLLOW UP STUDY  04/25/2021   IR CT HEAD LTD  04/25/2021   IR NEURO EACH ADD'L AFTER BASIC UNI LEFT (MS)  04/25/2021   IR RADIOLOGIST EVAL & MGMT  03/22/2021   IR RADIOLOGIST EVAL & MGMT  03/28/2021   IR RADIOLOGIST EVAL & MGMT  05/15/2021   IR RADIOLOGIST EVAL & MGMT  09/28/2021   IR TRANSCATH/EMBOLIZ  04/25/2021   IR US  GUIDE VASC ACCESS RIGHT  04/25/2021   MYRINGOTOMY WITH TUBE PLACEMENT Right 02/04/2017   Procedure: REVISION  OF RIGHT MYRINGOTOMY WITH TUBE PLACEMENT, WITH EXAM OF LEFT EAR;  Surgeon: Reynold Caves, MD;  Location: [facility];  Service: ENT;  Laterality: Right;   NASAL SINUS SURGERY  2016   polypectomy   OPEN REDUCTION INTERNAL FIXATION (ORIF) TIBIA/FIBULA FRACTURE Left 02/02/2023   Procedure: OPEN REDUCTION INTERNAL FIXATION (ORIF) LEFT TIBIA/FIBULA FRACTURE;  Surgeon: Diedra Fowler, MD;  Location: [facility];  Service: Orthopedics;  Laterality: Left;   POLYPECTOMY  07/25/2020   Procedure: POLYPECTOMY;  Surgeon: Urban Garden, MD;  [facility]: [facility];  Service: Gastroenterology;;   POLYPECTOMY  08/29/2020   Procedure: POLYPECTOMY;  Surgeon: Urban Garden, MD;  [facility];  Service: Gastroenterology;;   POLYPECTOMY  07/24/2022   Procedure: POLYPECTOMY;  Surgeon: Urban Garden, MD;  [facility];  Service: Gastroenterology;;   [facility] REMOVAL N/A 01/06/2019   Procedure: REMOVAL PORT-A-CATH;  Surgeon: Boyce Byes, MD;  Location: [facility];  Service: General;  Laterality: N/A;   PORTACATH PLACEMENT Right 06/16/2018   Procedure: INSERTION PORT-A-CATH;  Surgeon: Boyce Byes, MD;  Location: [facility];  Service: General;  Laterality: Right;   RADIOLOGY WITH ANESTHESIA N/A 04/25/2021   Procedure: IR WITH ANESTHESIA EMBOLIZATION;  Surgeon: Luellen Sages, MD;  Location: [facility];  Service: Radiology;  Laterality: N/A;   SCLEROTHERAPY  07/24/2022   Procedure: SCLEROTHERAPY;  Surgeon: Umberto Ganong, Bearl Limes, MD;  Location: [facility];  Service: Gastroenterology;;   SUBMUCOSAL LIFTING INJECTION  08/29/2020   Procedure: SUBMUCOSAL LIFTING INJECTION;  Surgeon: Umberto Ganong, Bearl Limes, MD;  [facility]: [facility];  Service: Gastroenterology;;   SUBMUCOSAL LIFTING INJECTION  07/24/2022   Procedure: SUBMUCOSAL LIFTING INJECTION;  Surgeon: Urban Garden, MD;  [facility]: [facility];  Service:  Gastroenterology;;   TONSILLECTOMY  1970   Patient Active Problem List   Diagnosis Date Noted   Closed fracture of left fibula and tibia 02/02/2023   Acute diarrhea 12/23/2022   Functional dyspepsia 07/01/2022   DOE (dyspnea on exertion) 01/15/2022   Nausea with vomiting 05/28/2021   Duodenal adenoma 05/28/2021   Brain aneurysm 04/25/2021   History of herpes simplex infection 01/08/2021   Papanicolaou smear, as part of routine gynecological examination 01/08/2021   CVA (cerebral vascular accident) (HCC) 01/04/2021   Acute ischemic stroke (HCC) 01/03/2021   Hypokalemia 01/03/2021   Cerebral aneurysm 01/03/2021   Musculoskeletal pain 01/03/2021   Early satiety 11/27/2020   Chronic abdominal pain 07/13/2020   Port-A-Cath in place 06/23/2018   Malignant neoplasm of upper-inner quadrant of left breast in female, estrogen receptor positive (HCC) 04/16/2018   Essential hypertension 03/21/2015   Hyperlipidemia 03/21/2015   Genital herpes 03/21/2015   Fecal urgency 03/21/2015   GERD (gastroesophageal reflux disease) 03/21/2015    PCP: Alston Jerry, MD  REFERRING PROVIDER: Diedra Fowler,  MD  REFERRING DIAG: S82.202D,S82.402D (ICD-10-CM) - Closed fracture of left tibia and fibula with routine healing, subsequent encounter   THERAPY DIAG:  Muscle weakness (generalized)  Closed fracture of left tibia and fibula with routine healing, subsequent encounter  Impaired functional mobility, balance, gait, and endurance  Rationale for Evaluation and Treatment: Rehabilitation  ONSET DATE: 02/02/2023  SUBJECTIVE:   SUBJECTIVE STATEMENT: No reports of pain today, arrived in boot.  Brought pair of regular shoes to change into today.    Pt had fall on the ice in Jan of 2025 and had ORIF in L tibia fibula. Had Home Health PT since then. With activity pt's pain gets 5/10.   PERTINENT HISTORY: Closed Fx of Left tibia and fibula Parkinson's Disease without Dyskinesia, without  mentation of fluctioantios Cerebral Aneurysm PAIN:  Are you having pain? No  PRECAUTIONS: Left CAM Boot-can transition to regular after a month in the boot  RED FLAGS: None   WEIGHT BEARING RESTRICTIONS: WBAT on LLE  FALLS:  Has patient fallen in last 6 months? Fall is MOI   PATIENT GOALS: "To be able to walk on my own with AD"  NEXT MD VISIT: in 3 months  OBJECTIVE:  Note: Objective measures were completed at Evaluation unless otherwise noted.  DIAGNOSTIC FINDINGS:   PATIENT SURVEYS:  LEFS 21/80 = 26.3%  COGNITION: Overall cognitive status: Within functional limits for tasks assessed     SENSATION: WFL  POSTURE: rounded shoulders and forward head  LOWER EXTREMITY ROM:  Active ROM Right eval Left eval  Hip flexion    Hip extension    Hip abduction    Hip adduction    Hip internal rotation    Hip external rotation    Knee flexion    Knee extension    Ankle dorsiflexion 15 A5  P8  Ankle plantarflexion    Ankle inversion WFL A2  P15  Ankle eversion WFL A10 20   (Blank rows = not tested)  LOWER EXTREMITY MMT:  MMT Right eval Left eval  Hip flexion    Hip extension    Hip abduction    Hip adduction    Hip internal rotation    Hip external rotation    Knee flexion    Knee extension 3+ 4+  Ankle dorsiflexion 2 4  Ankle plantarflexion 2+ 4  Ankle inversion 2+ 4  Ankle eversion 2+ 4   (Blank rows = not tested)   FUNCTIONAL TESTS:  30 Second Chair Stand Test: 5x  Norms:   Age 69-64 47-69 70-74 75-79 80-84 85-89 90-94  Women 15 15 14 13 12 11 9   Men 17 16 15 14 13 11 9    TUG: 47.96 seconds  GAIT: Distance walked: 3ft Assistive device utilized: Environmental consultant - 4 wheeled Level of assistance: SBA Comments: stepto pattern with RLE leading and then LLE  TREATMENT DATE:  06/11/23: Gait training with SPC in shoes x 3ft  with cueing for 2 point sequence and encouraged to increased Rt LE stride length with SPC in shoes: 54ft Seated:  Heel and toe raises 20x Standing:  Sit to stand 10x no HHA standard chair height  Heel raises on incline slope 2x 10  Toe raises on decline slope 2x 10  Rockerboard Lt/rt 2 minutes  Rockerboard fwd/bkwd 2 minutes  Squat front of chair 10x Slant board 3x 30" Tandem stance 1x 30" on solid surface 1x 30" on foam each     06/06/23: Ambulation, 7ftx2, in shoes, no AD, v cues for step through and heel strike  Ambulation with SPC in shoes, 39ft, v cues for the above and inc gait speed as to combat impaired balance Standing:  Heel raises: 2x10  Toes raises on decline, 2x10 Step up with LLE only, 4 inch step, w/ UE support, 2x10 STM to L gastroc muscle, pt seated, ~5' Slant board stretch 2X30"  06/04/23 Gait with shoe on Lt and crutch 120 foot Standing at // bars with bil UE assist  Heel raises 20X  Rockerboard Lt/rt 2 minutes  Rockerboard fwd/bkwd 2 minutes  Slant board stretch 3X30" Seated:  toe raises 20X  Windshield wiper with towel 2 minutes   05/29/23: Ambulation with normal shoe and one crutch, v cues for sequencing 18ft-->75ft-->50ft Seated Heel Raises, 2x10 Seated toe raises, 2x10 Seated Fitter Board,  forward/backward 10x  Ankle circles, 5x clockwise, counterclockwise Ankle 4 way, YTB, 10x each way     PATIENT EDUCATION:  Education details: PT Evaluation, findings, prognosis, frequency, attendance policy, and HEP. Person educated: Patient Education method: Medical illustrator Education comprehension: verbalized understanding  HOME EXERCISE PROGRAM: Access Code: [email or web address] URL: [email or web address] Date: 05/16/2023 Prepared by: Irene Mannheim  Exercises - Seated Ankle Dorsiflexion Stretch  - 1 x daily - 7 x weekly - 3 sets - 10 reps - Seated Ankle Pumps  - 1 x daily - 7 x weekly - 3 sets - 20 reps - Seated Ankle  Circles  - 1 x daily - 7 x weekly - 3 sets - 26 reps - Sit to Stand with Counter Support  - 1 x daily - 7 x weekly - 3 sets - 10 reps  ASSESSMENT:  CLINICAL IMPRESSION: Session focus with ankle mobility, gait training, balance and strength.  Session complete with pt wearing tennis shoes and ambulating with SPC.  Pt able to demonstrate good 2 point sequence, cueing to improve heel to toe mechanics and increase stride length.  complete, pt with very slow cadence but safe with no LOB.  Added functional strengthening and static balance activities with cueing for control, posture and mechanics.  No reports of pain through session, was limited by fatigue at EOS.   Patient is a 73 y.o. female who was seen today for physical therapy evaluation and treatment for S82.202D,S82.402D (ICD-10-CM) - Closed fracture of left tibia and fibula with routine healing, subsequent encounter. Pt known to this clinic from previous PT POC for balance in setting of Parkinson's. Pt with recent ORIF in L ankle, is WBAT in boot and can wean off after 4/28. Pt with noticeable functional mobility deficits and limitations in ADLs and functional mobility due ot muscle weakness, ROM deficits, pain and outcome measures above. Pt will benefit from skilled Physical Therapy services to address deficits/limitations in order to improve functional and QOL.    OBJECTIVE IMPAIRMENTS: Abnormal gait, decreased activity tolerance,  decreased balance, decreased mobility, difficulty walking, decreased ROM, decreased strength, hypomobility, postural dysfunction, and pain.   ACTIVITY LIMITATIONS: carrying, lifting, bending, standing, squatting, stairs, transfers, bed mobility, and locomotion level  PARTICIPATION LIMITATIONS: meal prep, driving, community activity, occupation, and yard work  PERSONAL FACTORS: Age and 1-2 comorbidities: Parkinson's are also affecting patient's functional outcome.   REHAB POTENTIAL: Good  CLINICAL DECISION  MAKING: Stable/uncomplicated  EVALUATION COMPLEXITY: Low   GOALS: Goals reviewed with patient? Yes  SHORT TERM GOALS: Target date: 06/06/23  Pt will be independent with HEP in order to demonstrate participation in Physical Therapy POC.  Baseline: Goal status: INITIAL  2.  Pt will 3/10 pain during mobility in order to demonstrate improved pain while performing ADLs.  Baseline:  Goal status: INITIAL  LONG TERM GOALS: Target date: 06/27/23  Pt will improve 30 Second Chair Stand Test by at least 2 reps in order to demonstrate improved functional strength to return to desired activities.  Baseline: see objective.  Goal status: INITIAL  2.  Pt will improve TUG by at least 15 seconds in order to demonstrate improved functional ambulatory capacity in community setting.  Baseline: see objective.  Goal status: INITIAL  3.  Pt will improve LEFS score by 20% in order to demonstrate improved pain with functional goals and outcomes. Baseline: see objective.  Goal status: INITIAL  4.  Pt will improve LLE ankle ROM by  at least 5 degrees in order to improve symmetry during functional activities.. Baseline: see objective.  Goal status: INITIAL  5.  Pt will improve BLE MMT at least 1/2 grade  in order to improve endurance, balance and tolerance during functional activities.. Baseline: see objective.  Goal status: INITIAL   PLAN:  PT FREQUENCY: 2x/week  PT DURATION: 6 weeks  PLANNED INTERVENTIONS: 97164- PT Re-evaluation, 97750- Physical Performance Testing, 97110-Therapeutic exercises, 97530- Therapeutic activity, 97112- Neuromuscular re-education, [phone]- Self Care, [id number]- Manual therapy, [phone]- Gait training, [phone]- Electrical stimulation (unattended), [phone]- Electrical stimulation (manual), Patient/Family education, Balance training, Stair training, Joint mobilization, Joint manipulation, Spinal manipulation, Spinal mobilization, DME instructions, Cryotherapy, and Moist heat  PLAN FOR  NEXT SESSION: Left ankle ROM, functional strengthening, inc WB activities out of boot to pt tolerance  Minor Amble, LPTA/CLT; CBIS [phone]  12:03 PM, 06/11/23

## 2023-06-13 ENCOUNTER — Ambulatory Visit (HOSPITAL_COMMUNITY)

## 2023-06-13 ENCOUNTER — Encounter (HOSPITAL_COMMUNITY): Payer: Self-pay

## 2023-06-13 DIAGNOSIS — S82402D Unspecified fracture of shaft of left fibula, subsequent encounter for closed fracture with routine healing: Secondary | ICD-10-CM | POA: Diagnosis not present

## 2023-06-13 DIAGNOSIS — M6281 Muscle weakness (generalized): Secondary | ICD-10-CM

## 2023-06-13 DIAGNOSIS — S82202D Unspecified fracture of shaft of left tibia, subsequent encounter for closed fracture with routine healing: Secondary | ICD-10-CM | POA: Diagnosis not present

## 2023-06-13 DIAGNOSIS — Z7409 Other reduced mobility: Secondary | ICD-10-CM | POA: Diagnosis not present

## 2023-06-13 NOTE — Therapy (Signed)
 OUTPATIENT PHYSICAL THERAPY LOWER EXTREMITY TREATMENT  Patient Name: Kimberly Mccormick MRN: [id number] DOB:21-May-1950, 73 y.o., female Today's Date: 06/13/2023  END OF SESSION:  PT End of Session - 06/13/23 1035     Visit Number 7    Number of Visits 12    Date for PT Re-Evaluation 06/27/23    Authorization Type [employer] Dual Complete    Authorization Time Period no auth; no limit    Progress Note Due on Visit 10    PT Start Time 1016    PT Stop Time 1055    PT Time Calculation (min) 39 min    Equipment Utilized During Treatment --    Activity Tolerance Patient tolerated treatment well    Behavior During Therapy WFL for tasks assessed/performed               Past Medical History:  Diagnosis Date   Aneurysm of left internal carotid artery    Small supraclinoid 5 mm   Breast cancer (HCC)    Left s/p lumpectomy (05/06/2018) and adjuvant chemotherapy (06/23/2018) with Adriamycin and Cytoxan  x4 followed by Taxol weekly x12   DDD (degenerative disc disease), lumbar    Essential hypertension    Genital warts    GERD (gastroesophageal reflux disease)    History of anemia    History of renal insufficiency    Hypertension    Insomnia    Iron deficiency anemia    Ischemic stroke [facility])    December 2022   Mixed hyperlipidemia    Osteoarthritis    Parkinson's disease (HCC)    Peripheral neuropathy    Related to chemotherapy   Personal history of chemotherapy 2020   Past Surgical History:  Procedure Laterality Date   APPENDECTOMY  1966   BIOPSY  08/29/2020   Procedure: BIOPSY;  Surgeon: Urban Garden, MD;  [facility];  Service: Gastroenterology;;   BIOPSY  07/24/2022   Procedure: BIOPSY;  Surgeon: Urban Garden, MD;  [facility]: [facility];  Service: Gastroenterology;;   BREAST LUMPECTOMY WITH RADIOACTIVE SEED AND SENTINEL LYMPH NODE BIOPSY Left 05/06/2018   Procedure: LEFT BREAST LUMPECTOMY WITH RADIOACTIVE SEED AND LEFT DEEP AXILLARY SENTINEL  LYMPH NODE BIOPSY AND BLUE DYE INJECTION;  Surgeon: Boyce Byes, MD;  Location: [facility];  Service: General;  Laterality: Left;   CATARACT EXTRACTION W/PHACO Left 01/10/2014   Procedure: CATARACT EXTRACTION PHACO AND INTRAOCULAR LENS PLACEMENT ; CDE:  4.94;  Surgeon: Clay Cummins, MD;  Location: [facility];  Service: Ophthalmology;  Laterality: Left;   CESAREAN SECTION  1986   CHOLECYSTECTOMY     COLONOSCOPY WITH PROPOFOL  N/A 07/25/2020   Procedure: COLONOSCOPY WITH PROPOFOL ;  Surgeon: Urban Garden, MD;  [facility];  Service: Gastroenterology;  Laterality: N/A;  10:55   ESOPHAGOGASTRODUODENOSCOPY (EGD) WITH PROPOFOL  N/A 08/29/2020   Procedure: ESOPHAGOGASTRODUODENOSCOPY (EGD) WITH PROPOFOL ;  Surgeon: Urban Garden, MD;  [facility];  Service: Gastroenterology;  Laterality: N/A;  12:00   ESOPHAGOGASTRODUODENOSCOPY (EGD) WITH PROPOFOL  N/A 07/24/2022   Procedure: ESOPHAGOGASTRODUODENOSCOPY (EGD) WITH PROPOFOL ;  Surgeon: Urban Garden, MD;  [facility];  Service: Gastroenterology;  Laterality: N/A;  12:45;ASA 1-2   HOT HEMOSTASIS  07/24/2022   Procedure: HOT HEMOSTASIS (ARGON PLASMA COAGULATION/BICAP);  Surgeon: Umberto Ganong, Bearl Limes, MD;  [facility];  Service: Gastroenterology;;   IR 3D INDEPENDENT WKST  04/25/2021   IR ANGIO INTRA EXTRACRAN SEL COM CAROTID INNOMINATE UNI R MOD SED  04/25/2021   IR ANGIO INTRA EXTRACRAN SEL INTERNAL CAROTID UNI L MOD SED  04/25/2021   IR ANGIOGRAM FOLLOW UP STUDY  04/25/2021   IR CT HEAD LTD  04/25/2021   IR NEURO EACH ADD'L AFTER BASIC UNI LEFT (MS)  04/25/2021   IR RADIOLOGIST EVAL & MGMT  03/22/2021   IR RADIOLOGIST EVAL & MGMT  03/28/2021   IR RADIOLOGIST EVAL & MGMT  05/15/2021   IR RADIOLOGIST EVAL & MGMT  09/28/2021   IR TRANSCATH/EMBOLIZ  04/25/2021   IR US  GUIDE VASC ACCESS RIGHT  04/25/2021   MYRINGOTOMY WITH TUBE PLACEMENT Right 02/04/2017   Procedure: REVISION OF RIGHT  MYRINGOTOMY WITH TUBE PLACEMENT, WITH EXAM OF LEFT EAR;  Surgeon: Reynold Caves, MD;  Location: [facility];  Service: ENT;  Laterality: Right;   NASAL SINUS SURGERY  2016   polypectomy   OPEN REDUCTION INTERNAL FIXATION (ORIF) TIBIA/FIBULA FRACTURE Left 02/02/2023   Procedure: OPEN REDUCTION INTERNAL FIXATION (ORIF) LEFT TIBIA/FIBULA FRACTURE;  Surgeon: Diedra Fowler, MD;  Location: [facility];  Service: Orthopedics;  Laterality: Left;   POLYPECTOMY  07/25/2020   Procedure: POLYPECTOMY;  Surgeon: Urban Garden, MD;  [facility];  Service: Gastroenterology;;   POLYPECTOMY  08/29/2020   Procedure: POLYPECTOMY;  Surgeon: Urban Garden, MD;  [facility];  Service: Gastroenterology;;   POLYPECTOMY  07/24/2022   Procedure: POLYPECTOMY;  Surgeon: Urban Garden, MD;  [facility];  Service: Gastroenterology;;   [facility] REMOVAL N/A 01/06/2019   Procedure: REMOVAL PORT-A-CATH;  Surgeon: Boyce Byes, MD;  Location: [facility];  Service: General;  Laterality: N/A;   PORTACATH PLACEMENT Right 06/16/2018   Procedure: INSERTION PORT-A-CATH;  Surgeon: Boyce Byes, MD;  Location: [facility];  Service: General;  Laterality: Right;   RADIOLOGY WITH ANESTHESIA N/A 04/25/2021   Procedure: IR WITH ANESTHESIA EMBOLIZATION;  Surgeon: Luellen Sages, MD;  Location: [facility];  Service: Radiology;  Laterality: N/A;   SCLEROTHERAPY  07/24/2022   Procedure: SCLEROTHERAPY;  Surgeon: Umberto Ganong, Bearl Limes, MD;  [facility];  Service: Gastroenterology;;   SUBMUCOSAL LIFTING INJECTION  08/29/2020   Procedure: SUBMUCOSAL LIFTING INJECTION;  Surgeon: Umberto Ganong, Bearl Limes, MD;  [facility];  Service: Gastroenterology;;   SUBMUCOSAL LIFTING INJECTION  07/24/2022   Procedure: SUBMUCOSAL LIFTING INJECTION;  Surgeon: Urban Garden, MD;  [facility];  Service:  Gastroenterology;;   TONSILLECTOMY  1970   Patient Active Problem List   Diagnosis Date Noted   Closed fracture of left fibula and tibia 02/02/2023   Acute diarrhea 12/23/2022   Functional dyspepsia 07/01/2022   DOE (dyspnea on exertion) 01/15/2022   Nausea with vomiting 05/28/2021   Duodenal adenoma 05/28/2021   Brain aneurysm 04/25/2021   History of herpes simplex infection 01/08/2021   Papanicolaou smear, as part of routine gynecological examination 01/08/2021   CVA (cerebral vascular accident) (HCC) 01/04/2021   Acute ischemic stroke (HCC) 01/03/2021   Hypokalemia 01/03/2021   Cerebral aneurysm 01/03/2021   Musculoskeletal pain 01/03/2021   Early satiety 11/27/2020   Chronic abdominal pain 07/13/2020   Port-A-Cath in place 06/23/2018   Malignant neoplasm of upper-inner quadrant of left breast in female, estrogen receptor positive (HCC) 04/16/2018   Essential hypertension 03/21/2015   Hyperlipidemia 03/21/2015   Genital herpes 03/21/2015   Fecal urgency 03/21/2015   GERD (gastroesophageal reflux disease) 03/21/2015    PCP: Alston Jerry, MD  REFERRING PROVIDER: Diedra Fowler,  MD  REFERRING DIAG: S82.202D,S82.402D (ICD-10-CM) - Closed fracture of left tibia and fibula with routine healing, subsequent encounter   THERAPY DIAG:  Muscle weakness (generalized)  Closed fracture of left tibia and fibula with routine healing, subsequent encounter  Impaired functional mobility, balance, gait, and endurance  Rationale for Evaluation and Treatment: Rehabilitation  ONSET DATE: 02/02/2023  SUBJECTIVE:   SUBJECTIVE STATEMENT: Pt reports no pain today. Asks when she can not have to wear the boot again. Arrived in boot.     Pt had fall on the ice in Jan of 2025 and had ORIF in L tibia fibula. Had Home Health PT since then. With activity pt's pain gets 5/10.   PERTINENT HISTORY: Closed Fx of Left tibia and fibula Parkinson's Disease without Dyskinesia, without  mentation of fluctioantios Cerebral Aneurysm PAIN:  Are you having pain? No  PRECAUTIONS: Left CAM Boot-can transition to regular after a month in the boot  RED FLAGS: None   WEIGHT BEARING RESTRICTIONS: WBAT on LLE  FALLS:  Has patient fallen in last 6 months? Fall is MOI   PATIENT GOALS: "To be able to walk on my own with AD"  NEXT MD VISIT: in 3 months  OBJECTIVE:  Note: Objective measures were completed at Evaluation unless otherwise noted.  DIAGNOSTIC FINDINGS:   PATIENT SURVEYS:  LEFS 21/80 = 26.3%  COGNITION: Overall cognitive status: Within functional limits for tasks assessed     SENSATION: WFL  POSTURE: rounded shoulders and forward head  LOWER EXTREMITY ROM:  Active ROM Right eval Left eval  Hip flexion    Hip extension    Hip abduction    Hip adduction    Hip internal rotation    Hip external rotation    Knee flexion    Knee extension    Ankle dorsiflexion 15 A5  P8  Ankle plantarflexion    Ankle inversion WFL A2  P15  Ankle eversion WFL A10 20   (Blank rows = not tested)  LOWER EXTREMITY MMT:  MMT Right eval Left eval  Hip flexion    Hip extension    Hip abduction    Hip adduction    Hip internal rotation    Hip external rotation    Knee flexion    Knee extension 3+ 4+  Ankle dorsiflexion 2 4  Ankle plantarflexion 2+ 4  Ankle inversion 2+ 4  Ankle eversion 2+ 4   (Blank rows = not tested)   FUNCTIONAL TESTS:  30 Second Chair Stand Test: 5x  Norms:   Age 75-64 97-69 70-74 75-79 89-84 85-89 90-94  Women 15 15 14 13 12 11 9   Men 17 16 15 14 13 11 9    TUG: 47.96 seconds  GAIT: Distance walked: 35ft Assistive device utilized: Environmental consultant - 4 wheeled Level of assistance: SBA Comments: stepto pattern with RLE leading and then LLE  TREATMENT DATE:  06/13/2023  Therapeutic Exercise: -Supine  bridges 2 sets of 10 reps, 3 second holds, symptomatic, pt cued for max hip extension -Heel/toe raises, 2 sets of 10 reps, on incline and BUE support -Lateral stepping 3 laps 20 feet per lap, second 2 with RTB around ankles, pt cued for upright posture -Aeromat tandem walks in parallel bars, 3 laps, BUE support, pt cued for decreased UE support -Aeromat lateral stepping in parallel bars, 2 laps, no UE support -Rocker board in parallel bars, 2 sets of 10 reps   Therapeutic Activity: -Sit to stands with trampoline toss red ball, 2 sets of 6 reps, pt cued for core activation (second set with staggered stance on blue foam) -Step up with power up at top, 1 set of 5 reps bilaterally, pt cued for decreased UE support   06/11/23: Gait training with SPC in shoes x 78ft with cueing for 2 point sequence and encouraged to increased Rt LE stride length with SPC in shoes: 10ft Seated:  Heel and toe raises 20x Standing:  Sit to stand 10x no HHA standard chair height  Heel raises on incline slope 2x 10  Toe raises on decline slope 2x 10  Rockerboard Lt/rt 2 minutes  Rockerboard fwd/bkwd 2 minutes  Squat front of chair 10x Slant board 3x 30" Tandem stance 1x 30" on solid surface 1x 30" on foam each     06/06/23: Ambulation, 62ftx2, in shoes, no AD, v cues for step through and heel strike  Ambulation with SPC in shoes, 27ft, v cues for the above and inc gait speed as to combat impaired balance Standing:  Heel raises: 2x10  Toes raises on decline, 2x10 Step up with LLE only, 4 inch step, w/ UE support, 2x10 STM to L gastroc muscle, pt seated, ~5' Slant board stretch 2X30"  06/04/23 Gait with shoe on Lt and crutch 120 foot Standing at // bars with bil UE assist  Heel raises 20X  Rockerboard Lt/rt 2 minutes  Rockerboard fwd/bkwd 2 minutes  Slant board stretch 3X30" Seated:  toe raises 20X  Windshield wiper with towel 2 minutes       PATIENT EDUCATION:  Education details: PT  Evaluation, findings, prognosis, frequency, attendance policy, and HEP. Person educated: Patient Education method: Medical illustrator Education comprehension: verbalized understanding  HOME EXERCISE PROGRAM: Access Code: [email or web address] URL: [email or web address] Date: 05/16/2023 Prepared by: Irene Mannheim  Exercises - Seated Ankle Dorsiflexion Stretch  - 1 x daily - 7 x weekly - 3 sets - 10 reps - Seated Ankle Pumps  - 1 x daily - 7 x weekly - 3 sets - 20 reps - Seated Ankle Circles  - 1 x daily - 7 x weekly - 3 sets - 26 reps - Sit to Stand with Counter Support  - 1 x daily - 7 x weekly - 3 sets - 10 reps  ASSESSMENT:  CLINICAL IMPRESSION: Patient continues to demonstrate decreased LLE strength, decreased gait quality and balance. Patient also demonstrates decreased endurance with need for multiple breaks during today's session. Patient able to progress dynamic balance and core activation exercises today with aero mat walks and STS variation, good performance with verbal cueing. Patient would continue to benefit from skilled physical therapy for increased endurance with ambulation, increased bilateral LE strength, and improved balance for improved quality of life, improved independence with gait training and continued progress towards therapy goals.    Patient is a 73 y.o. female who was seen today for physical therapy  evaluation and treatment for S82.202D,S82.402D (ICD-10-CM) - Closed fracture of left tibia and fibula with routine healing, subsequent encounter. Pt known to this clinic from previous PT POC for balance in setting of Parkinson's. Pt with recent ORIF in L ankle, is WBAT in boot and can wean off after 4/28. Pt with noticeable functional mobility deficits and limitations in ADLs and functional mobility due ot muscle weakness, ROM deficits, pain and outcome measures above. Pt will benefit from skilled Physical Therapy services to address deficits/limitations in  order to improve functional and QOL.    OBJECTIVE IMPAIRMENTS: Abnormal gait, decreased activity tolerance, decreased balance, decreased mobility, difficulty walking, decreased ROM, decreased strength, hypomobility, postural dysfunction, and pain.   ACTIVITY LIMITATIONS: carrying, lifting, bending, standing, squatting, stairs, transfers, bed mobility, and locomotion level  PARTICIPATION LIMITATIONS: meal prep, driving, community activity, occupation, and yard work  PERSONAL FACTORS: Age and 1-2 comorbidities: Parkinson's are also affecting patient's functional outcome.   REHAB POTENTIAL: Good  CLINICAL DECISION MAKING: Stable/uncomplicated  EVALUATION COMPLEXITY: Low   GOALS: Goals reviewed with patient? Yes  SHORT TERM GOALS: Target date: 06/06/23  Pt will be independent with HEP in order to demonstrate participation in Physical Therapy POC.  Baseline: Goal status: INITIAL  2.  Pt will 3/10 pain during mobility in order to demonstrate improved pain while performing ADLs.  Baseline:  Goal status: INITIAL  LONG TERM GOALS: Target date: 06/27/23  Pt will improve 30 Second Chair Stand Test by at least 2 reps in order to demonstrate improved functional strength to return to desired activities.  Baseline: see objective.  Goal status: INITIAL  2.  Pt will improve TUG by at least 15 seconds in order to demonstrate improved functional ambulatory capacity in community setting.  Baseline: see objective.  Goal status: INITIAL  3.  Pt will improve LEFS score by 20% in order to demonstrate improved pain with functional goals and outcomes. Baseline: see objective.  Goal status: INITIAL  4.  Pt will improve LLE ankle ROM by  at least 5 degrees in order to improve symmetry during functional activities.. Baseline: see objective.  Goal status: INITIAL  5.  Pt will improve BLE MMT at least 1/2 grade  in order to improve endurance, balance and tolerance during functional  activities.. Baseline: see objective.  Goal status: INITIAL   PLAN:  PT FREQUENCY: 2x/week  PT DURATION: 6 weeks  PLANNED INTERVENTIONS: [id number]- PT Re-evaluation, 97750- Physical Performance Testing, 97110-Therapeutic exercises, 97530- Therapeutic activity, 97112- Neuromuscular re-education, [phone]- Self Care, 60454- Manual therapy, [phone]- Gait training, [phone]- Electrical stimulation (unattended), [phone]- Electrical stimulation (manual), Patient/Family education, Balance training, Stair training, Joint mobilization, Joint manipulation, Spinal manipulation, Spinal mobilization, DME instructions, Cryotherapy, and Moist heat  PLAN FOR NEXT SESSION: Left ankle ROM, functional strengthening, inc WB activities out of boot to pt tolerance  Armond Bertin, PT, DPT [facility] Office: [phone] 11:01 AM, 06/13/23

## 2023-06-18 ENCOUNTER — Ambulatory Visit (HOSPITAL_COMMUNITY)

## 2023-06-18 ENCOUNTER — Encounter (HOSPITAL_COMMUNITY): Payer: Self-pay

## 2023-06-18 DIAGNOSIS — S82402D Unspecified fracture of shaft of left fibula, subsequent encounter for closed fracture with routine healing: Secondary | ICD-10-CM

## 2023-06-18 DIAGNOSIS — M6281 Muscle weakness (generalized): Secondary | ICD-10-CM

## 2023-06-18 DIAGNOSIS — S82202D Unspecified fracture of shaft of left tibia, subsequent encounter for closed fracture with routine healing: Secondary | ICD-10-CM | POA: Diagnosis not present

## 2023-06-18 DIAGNOSIS — Z7409 Other reduced mobility: Secondary | ICD-10-CM

## 2023-06-18 NOTE — Therapy (Signed)
 OUTPATIENT PHYSICAL THERAPY LOWER EXTREMITY TREATMENT  Patient Name: Kimberly Mccormick MRN: [id number] DOB:April 09, 1950, 73 y.o., female Today's Date: 06/18/2023  END OF SESSION:  PT End of Session - 06/18/23 1152     Visit Number 8    Number of Visits 12    Date for PT Re-Evaluation 06/27/23    Authorization Type [employer] Dual Complete    Authorization Time Period no auth; no limit    Progress Note Due on Visit 10    PT Start Time 1152    PT Stop Time 1230    PT Time Calculation (min) 38 min    Activity Tolerance Patient tolerated treatment well    Behavior During Therapy WFL for tasks assessed/performed               Past Medical History:  Diagnosis Date   Aneurysm of left internal carotid artery    Small supraclinoid 5 mm   Breast cancer (HCC)    Left s/p lumpectomy (05/06/2018) and adjuvant chemotherapy (06/23/2018) with Adriamycin and Cytoxan  x4 followed by Taxol weekly x12   DDD (degenerative disc disease), lumbar    Essential hypertension    Genital warts    GERD (gastroesophageal reflux disease)    History of anemia    History of renal insufficiency    Hypertension    Insomnia    Iron deficiency anemia    Ischemic stroke [facility])    December 2022   Mixed hyperlipidemia    Osteoarthritis    Parkinson's disease (HCC)    Peripheral neuropathy    Related to chemotherapy   Personal history of chemotherapy 2020   Past Surgical History:  Procedure Laterality Date   APPENDECTOMY  1966   BIOPSY  08/29/2020   Procedure: BIOPSY;  Surgeon: Urban Garden, MD;  [facility]: [facility];  Service: Gastroenterology;;   BIOPSY  07/24/2022   Procedure: BIOPSY;  Surgeon: Urban Garden, MD;  [facility]: [facility];  Service: Gastroenterology;;   BREAST LUMPECTOMY WITH RADIOACTIVE SEED AND SENTINEL LYMPH NODE BIOPSY Left 05/06/2018   Procedure: LEFT BREAST LUMPECTOMY WITH RADIOACTIVE SEED AND LEFT DEEP AXILLARY SENTINEL LYMPH NODE BIOPSY AND BLUE DYE INJECTION;   Surgeon: Boyce Byes, MD;  Location: [facility];  Service: General;  Laterality: Left;   CATARACT EXTRACTION W/PHACO Left 01/10/2014   Procedure: CATARACT EXTRACTION PHACO AND INTRAOCULAR LENS PLACEMENT ; CDE:  4.94;  Surgeon: Clay Cummins, MD;  Location: [facility];  Service: Ophthalmology;  Laterality: Left;   CESAREAN SECTION  1986   CHOLECYSTECTOMY     COLONOSCOPY WITH PROPOFOL  N/A 07/25/2020   Procedure: COLONOSCOPY WITH PROPOFOL ;  Surgeon: Urban Garden, MD;  [facility]: [facility];  Service: Gastroenterology;  Laterality: N/A;  10:55   ESOPHAGOGASTRODUODENOSCOPY (EGD) WITH PROPOFOL  N/A 08/29/2020   Procedure: ESOPHAGOGASTRODUODENOSCOPY (EGD) WITH PROPOFOL ;  Surgeon: Urban Garden, MD;  [facility];  Service: Gastroenterology;  Laterality: N/A;  12:00   ESOPHAGOGASTRODUODENOSCOPY (EGD) WITH PROPOFOL  N/A 07/24/2022   Procedure: ESOPHAGOGASTRODUODENOSCOPY (EGD) WITH PROPOFOL ;  Surgeon: Urban Garden, MD;  [facility];  Service: Gastroenterology;  Laterality: N/A;  12:45;ASA 1-2   HOT HEMOSTASIS  07/24/2022   Procedure: HOT HEMOSTASIS (ARGON PLASMA COAGULATION/BICAP);  Surgeon: Umberto Ganong, Bearl Limes, MD;  [facility]: [facility];  Service: Gastroenterology;;   IR 3D INDEPENDENT WKST  04/25/2021   IR ANGIO INTRA EXTRACRAN SEL COM CAROTID INNOMINATE UNI R MOD SED  04/25/2021   IR ANGIO INTRA EXTRACRAN SEL  INTERNAL CAROTID UNI L MOD SED  04/25/2021   IR ANGIOGRAM FOLLOW UP STUDY  04/25/2021   IR CT HEAD LTD  04/25/2021   IR NEURO EACH ADD'L AFTER BASIC UNI LEFT (MS)  04/25/2021   IR RADIOLOGIST EVAL & MGMT  03/22/2021   IR RADIOLOGIST EVAL & MGMT  03/28/2021   IR RADIOLOGIST EVAL & MGMT  05/15/2021   IR RADIOLOGIST EVAL & MGMT  09/28/2021   IR TRANSCATH/EMBOLIZ  04/25/2021   IR US  GUIDE VASC ACCESS RIGHT  04/25/2021   MYRINGOTOMY WITH TUBE PLACEMENT Right 02/04/2017   Procedure: REVISION OF RIGHT MYRINGOTOMY WITH TUBE PLACEMENT, WITH EXAM  OF LEFT EAR;  Surgeon: Reynold Caves, MD;  Location: [facility];  Service: ENT;  Laterality: Right;   NASAL SINUS SURGERY  2016   polypectomy   OPEN REDUCTION INTERNAL FIXATION (ORIF) TIBIA/FIBULA FRACTURE Left 02/02/2023   Procedure: OPEN REDUCTION INTERNAL FIXATION (ORIF) LEFT TIBIA/FIBULA FRACTURE;  Surgeon: Diedra Fowler, MD;  Location: [facility];  Service: Orthopedics;  Laterality: Left;   POLYPECTOMY  07/25/2020   Procedure: POLYPECTOMY;  Surgeon: Urban Garden, MD;  [facility];  Service: Gastroenterology;;   POLYPECTOMY  08/29/2020   Procedure: POLYPECTOMY;  Surgeon: Urban Garden, MD;  [facility];  Service: Gastroenterology;;   POLYPECTOMY  07/24/2022   Procedure: POLYPECTOMY;  Surgeon: Urban Garden, MD;  [facility];  Service: Gastroenterology;;   [facility] REMOVAL N/A 01/06/2019   Procedure: REMOVAL PORT-A-CATH;  Surgeon: Boyce Byes, MD;  Location: [facility];  Service: General;  Laterality: N/A;   PORTACATH PLACEMENT Right 06/16/2018   Procedure: INSERTION PORT-A-CATH;  Surgeon: Boyce Byes, MD;  Location: [facility];  Service: General;  Laterality: Right;   RADIOLOGY WITH ANESTHESIA N/A 04/25/2021   Procedure: IR WITH ANESTHESIA EMBOLIZATION;  Surgeon: Luellen Sages, MD;  Location: [facility];  Service: Radiology;  Laterality: N/A;   SCLEROTHERAPY  07/24/2022   Procedure: SCLEROTHERAPY;  Surgeon: Umberto Ganong, Bearl Limes, MD;  [facility];  Service: Gastroenterology;;   [place] LIFTING INJECTION  08/29/2020   Procedure: SUBMUCOSAL LIFTING INJECTION;  Surgeon: Umberto Ganong, Bearl Limes, MD;  [facility];  Service: Gastroenterology;;   Jona [place] INJECTION  07/24/2022   Procedure: SUBMUCOSAL LIFTING INJECTION;  Surgeon: Urban Garden, MD;  [facility];  Service: Gastroenterology;;   TONSILLECTOMY  1970   Patient  Active Problem List   Diagnosis Date Noted   Closed fracture of left fibula and tibia 02/02/2023   Acute diarrhea 12/23/2022   Functional dyspepsia 07/01/2022   DOE (dyspnea on exertion) 01/15/2022   Nausea with vomiting 05/28/2021   Duodenal adenoma 05/28/2021   Brain aneurysm 04/25/2021   History of herpes simplex infection 01/08/2021   Papanicolaou smear, as part of routine gynecological examination 01/08/2021   CVA (cerebral vascular accident) (HCC) 01/04/2021   Acute ischemic stroke (HCC) 01/03/2021   Hypokalemia 01/03/2021   Cerebral aneurysm 01/03/2021   Musculoskeletal pain 01/03/2021   Early satiety 11/27/2020   Chronic abdominal pain 07/13/2020   Port-A-Cath in place 06/23/2018   Malignant neoplasm of upper-inner quadrant of left breast in female, estrogen receptor positive (HCC) 04/16/2018   Essential hypertension 03/21/2015   Hyperlipidemia 03/21/2015   Genital herpes 03/21/2015   Fecal urgency 03/21/2015   GERD (gastroesophageal reflux disease) 03/21/2015    PCP: Alston Jerry, MD  REFERRING PROVIDER: Diedra Fowler, MD  REFERRING DIAG: S82.202D,S82.402D (ICD-10-CM) - Closed  fracture of left tibia and fibula with routine healing, subsequent encounter   THERAPY DIAG:  Muscle weakness (generalized)  Closed fracture of left tibia and fibula with routine healing, subsequent encounter  Impaired functional mobility, balance, gait, and endurance  Rationale for Evaluation and Treatment: Rehabilitation  ONSET DATE: 02/02/2023  SUBJECTIVE:   SUBJECTIVE STATEMENT: Pt reports she has 6/10 stiffness in L ankle and knee today. Arrived out of boot. Reports she is in shoe majority of time.   Pt had fall on the ice in Jan of 2025 and had ORIF in L tibia fibula. Had Home Health PT since then. With activity pt's pain gets 5/10.   PERTINENT HISTORY: Closed Fx of Left tibia and fibula Parkinson's Disease without Dyskinesia, without mentation of  fluctioantios Cerebral Aneurysm PAIN:  Are you having pain? No  PRECAUTIONS: Left CAM Boot-can transition to regular after a month in the boot  RED FLAGS: None   WEIGHT BEARING RESTRICTIONS: WBAT on LLE  FALLS:  Has patient fallen in last 6 months? Fall is MOI   PATIENT GOALS: "To be able to walk on my own with AD"  NEXT MD VISIT: in 3 months  OBJECTIVE:  Note: Objective measures were completed at Evaluation unless otherwise noted.  DIAGNOSTIC FINDINGS:   PATIENT SURVEYS:  LEFS 21/80 = 26.3%  COGNITION: Overall cognitive status: Within functional limits for tasks assessed     SENSATION: WFL  POSTURE: rounded shoulders and forward head  LOWER EXTREMITY ROM:  Active ROM Right eval Left eval  Hip flexion    Hip extension    Hip abduction    Hip adduction    Hip internal rotation    Hip external rotation    Knee flexion    Knee extension    Ankle dorsiflexion 15 A5  P8  Ankle plantarflexion    Ankle inversion WFL A2  P15  Ankle eversion WFL A10 20   (Blank rows = not tested)  LOWER EXTREMITY MMT:  MMT Right eval Left eval  Hip flexion    Hip extension    Hip abduction    Hip adduction    Hip internal rotation    Hip external rotation    Knee flexion    Knee extension 3+ 4+  Ankle dorsiflexion 2 4  Ankle plantarflexion 2+ 4  Ankle inversion 2+ 4  Ankle eversion 2+ 4   (Blank rows = not tested)   FUNCTIONAL TESTS:  30 Second Chair Stand Test: 5x  Norms:   Age 31-64 56-69 70-74 75-79 80-84 85-89 90-94  Women 15 15 14 13 12 11 9   Men 17 16 15 14 13 11 9    TUG: 47.96 seconds  GAIT: Distance walked: 59ft Assistive device utilized: Environmental consultant - 4 wheeled Level of assistance: SBA Comments: stepto pattern with RLE leading and then LLE  TREATMENT DATE:  06/18/23: -NuStep, seat 9, 5'  -Heel/toe raises, 2 sets of  10 reps, on incline and BUE support -Gait training: in shoes, with SPC.   83 ft continuous, using cones as visual cues to inc step length, inc pain 5/10 -Seated PF, GTB, 3x10 -Seated eversion, GTB, 3x10 -Seated DF, GTB, 3x10 -Obstacle clearance in // bars: LLE on foam mat, RLE stepping over foam block and back. RUE use only, 10x, pt w/ inc discomfort  06/13/2023  Therapeutic Exercise: -Supine bridges 2 sets of 10 reps, 3 second holds, symptomatic, pt cued for max hip extension -Heel/toe raises, 2 sets of 10 reps, on incline and BUE support -Lateral stepping 3 laps 20 feet per lap, second 2 with RTB around ankles, pt cued for upright posture -Aeromat tandem walks in parallel bars, 3 laps, BUE support, pt cued for decreased UE support -Aeromat lateral stepping in parallel bars, 2 laps, no UE support -Rocker board in parallel bars, 2 sets of 10 reps   Therapeutic Activity: -Sit to stands with trampoline toss red ball, 2 sets of 6 reps, pt cued for core activation (second set with staggered stance on blue foam) -Step up with power up at top, 1 set of 5 reps bilaterally, pt cued for decreased UE support   06/11/23: Gait training with SPC in shoes x 33ft with cueing for 2 point sequence and encouraged to increased Rt LE stride length with SPC in shoes: 41ft Seated:  Heel and toe raises 20x Standing:  Sit to stand 10x no HHA standard chair height  Heel raises on incline slope 2x 10  Toe raises on decline slope 2x 10  Rockerboard Lt/rt 2 minutes  Rockerboard fwd/bkwd 2 minutes  Squat front of chair 10x Slant board 3x 30" Tandem stance 1x 30" on solid surface 1x 30" on foam each     PATIENT EDUCATION:  Education details: PT Evaluation, findings, prognosis, frequency, attendance policy, and HEP. Person educated: Patient Education method: Medical illustrator Education comprehension: verbalized understanding  HOME EXERCISE PROGRAM: Access Code: [email or web address] URL:  [email or web address] Date: 05/16/2023 Prepared by: Irene Mannheim  Exercises - Seated Ankle Dorsiflexion Stretch  - 1 x daily - 7 x weekly - 3 sets - 10 reps - Seated Ankle Pumps  - 1 x daily - 7 x weekly - 3 sets - 20 reps - Seated Ankle Circles  - 1 x daily - 7 x weekly - 3 sets - 26 reps - Sit to Stand with Counter Support  - 1 x daily - 7 x weekly - 3 sets - 10 reps  ASSESSMENT:  CLINICAL IMPRESSION: Patient tolerated session well. Began with NuStep and heel/toe raises to aid in stiffness felt in L ankle and knee. During ambulation, patient continues to display decreased stance time on LLE causing decreased step length with RLE. Session focused on gait training with visual cues help to form equal step lengths bilaterally. Patient reports inc pain in ankle joint. Followed with seated ankle strengthening. Ended with obstacle clearance on soft surface to further address stance/step length discrepancy. Consistent verbal cueing needed throughout. Only two seated breaks through session. Patient would continue to benefit from skilled physical therapy for increased endurance with ambulation, increased bilateral LE strength, and improved balance for improved quality of life, improved independence with gait training and continued progress towards therapy goals.   Patient is a 73 y.o. female who was seen today for physical therapy evaluation and treatment for S82.202D,S82.402D (ICD-10-CM) - Closed fracture of  left tibia and fibula with routine healing, subsequent encounter. Pt known to this clinic from previous PT POC for balance in setting of Parkinson's. Pt with recent ORIF in L ankle, is WBAT in boot and can wean off after 4/28. Pt with noticeable functional mobility deficits and limitations in ADLs and functional mobility due ot muscle weakness, ROM deficits, pain and outcome measures above. Pt will benefit from skilled Physical Therapy services to address deficits/limitations in order to  improve functional and QOL.    OBJECTIVE IMPAIRMENTS: Abnormal gait, decreased activity tolerance, decreased balance, decreased mobility, difficulty walking, decreased ROM, decreased strength, hypomobility, postural dysfunction, and pain.   ACTIVITY LIMITATIONS: carrying, lifting, bending, standing, squatting, stairs, transfers, bed mobility, and locomotion level  PARTICIPATION LIMITATIONS: meal prep, driving, community activity, occupation, and yard work  PERSONAL FACTORS: Age and 1-2 comorbidities: Parkinson's are also affecting patient's functional outcome.   REHAB POTENTIAL: Good  CLINICAL DECISION MAKING: Stable/uncomplicated  EVALUATION COMPLEXITY: Low   GOALS: Goals reviewed with patient? Yes  SHORT TERM GOALS: Target date: 06/06/23  Pt will be independent with HEP in order to demonstrate participation in Physical Therapy POC.  Baseline: Goal status: INITIAL  2.  Pt will 3/10 pain during mobility in order to demonstrate improved pain while performing ADLs.  Baseline:  Goal status: INITIAL  LONG TERM GOALS: Target date: 06/27/23  Pt will improve 30 Second Chair Stand Test by at least 2 reps in order to demonstrate improved functional strength to return to desired activities.  Baseline: see objective.  Goal status: INITIAL  2.  Pt will improve TUG by at least 15 seconds in order to demonstrate improved functional ambulatory capacity in community setting.  Baseline: see objective.  Goal status: INITIAL  3.  Pt will improve LEFS score by 20% in order to demonstrate improved pain with functional goals and outcomes. Baseline: see objective.  Goal status: INITIAL  4.  Pt will improve LLE ankle ROM by  at least 5 degrees in order to improve symmetry during functional activities.. Baseline: see objective.  Goal status: INITIAL  5.  Pt will improve BLE MMT at least 1/2 grade  in order to improve endurance, balance and tolerance during functional activities.. Baseline: see  objective.  Goal status: INITIAL   PLAN:  PT FREQUENCY: 2x/week  PT DURATION: 6 weeks  PLANNED INTERVENTIONS: 16109- PT Re-evaluation, 97750- Physical Performance Testing, 97110-Therapeutic exercises, 97530- Therapeutic activity, 97112- Neuromuscular re-education, 97535- Self Care, [id number]- Manual therapy, [phone]- Gait training, [phone]- Electrical stimulation (unattended), [phone]- Electrical stimulation (manual), Patient/Family education, Balance training, Stair training, Joint mobilization, Joint manipulation, Spinal manipulation, Spinal mobilization, DME instructions, Cryotherapy, and Moist heat  PLAN FOR NEXT SESSION: Left ankle ROM, functional strengthening, inc WB activities out of boot to pt tolerance    12:39 PM, 06/18/23 Marysue Sola, PT, DPT [facility] [place]

## 2023-06-20 ENCOUNTER — Encounter (HOSPITAL_COMMUNITY): Payer: Self-pay

## 2023-06-20 ENCOUNTER — Ambulatory Visit (HOSPITAL_COMMUNITY)

## 2023-06-20 DIAGNOSIS — Z7409 Other reduced mobility: Secondary | ICD-10-CM

## 2023-06-20 DIAGNOSIS — S82402D Unspecified fracture of shaft of left fibula, subsequent encounter for closed fracture with routine healing: Secondary | ICD-10-CM | POA: Diagnosis not present

## 2023-06-20 DIAGNOSIS — M6281 Muscle weakness (generalized): Secondary | ICD-10-CM

## 2023-06-20 DIAGNOSIS — S82202D Unspecified fracture of shaft of left tibia, subsequent encounter for closed fracture with routine healing: Secondary | ICD-10-CM | POA: Diagnosis not present

## 2023-06-20 NOTE — Therapy (Signed)
 OUTPATIENT PHYSICAL THERAPY LOWER EXTREMITY TREATMENT  Patient Name: Kimberly Mccormick MRN: [id number] DOB:1950/07/03, 73 y.o., female Today's Date: 06/20/2023  END OF SESSION:  PT End of Session - 06/20/23 0938     Visit Number 9    Number of Visits 12    Date for PT Re-Evaluation 06/27/23    Authorization Type [employer] Dual Complete    Authorization Time Period no auth; no limit    Progress Note Due on Visit 10    PT Start Time 0937    PT Stop Time 1018    PT Time Calculation (min) 41 min    Equipment Utilized During Treatment Gait belt    Activity Tolerance Patient tolerated treatment well    Behavior During Therapy WFL for tasks assessed/performed               Past Medical History:  Diagnosis Date   Aneurysm of left internal carotid artery    Small supraclinoid 5 mm   Breast cancer (HCC)    Left s/p lumpectomy (05/06/2018) and adjuvant chemotherapy (06/23/2018) with Adriamycin and Cytoxan  x4 followed by Taxol weekly x12   DDD (degenerative disc disease), lumbar    Essential hypertension    Genital warts    GERD (gastroesophageal reflux disease)    History of anemia    History of renal insufficiency    Hypertension    Insomnia    Iron deficiency anemia    Ischemic stroke [facility])    December 2022   Mixed hyperlipidemia    Osteoarthritis    Parkinson's disease (HCC)    Peripheral neuropathy    Related to chemotherapy   Personal history of chemotherapy 2020   Past Surgical History:  Procedure Laterality Date   APPENDECTOMY  1966   BIOPSY  08/29/2020   Procedure: BIOPSY;  Surgeon: Urban Garden, MD;  [facility];  Service: Gastroenterology;;   BIOPSY  07/24/2022   Procedure: BIOPSY;  Surgeon: Urban Garden, MD;  [facility]: [facility];  Service: Gastroenterology;;   BREAST LUMPECTOMY WITH RADIOACTIVE SEED AND SENTINEL LYMPH NODE BIOPSY Left 05/06/2018   Procedure: LEFT BREAST LUMPECTOMY WITH RADIOACTIVE SEED AND LEFT DEEP AXILLARY  SENTINEL LYMPH NODE BIOPSY AND BLUE DYE INJECTION;  Surgeon: Boyce Byes, MD;  Location: [facility];  Service: General;  Laterality: Left;   CATARACT EXTRACTION W/PHACO Left 01/10/2014   Procedure: CATARACT EXTRACTION PHACO AND INTRAOCULAR LENS PLACEMENT ; CDE:  4.94;  Surgeon: Clay Cummins, MD;  Location: [facility];  Service: Ophthalmology;  Laterality: Left;   CESAREAN SECTION  1986   CHOLECYSTECTOMY     COLONOSCOPY WITH PROPOFOL  N/A 07/25/2020   Procedure: COLONOSCOPY WITH PROPOFOL ;  Surgeon: Urban Garden, MD;  [facility];  Service: Gastroenterology;  Laterality: N/A;  10:55   ESOPHAGOGASTRODUODENOSCOPY (EGD) WITH PROPOFOL  N/A 08/29/2020   Procedure: ESOPHAGOGASTRODUODENOSCOPY (EGD) WITH PROPOFOL ;  Surgeon: Urban Garden, MD;  [facility];  Service: Gastroenterology;  Laterality: N/A;  12:00   ESOPHAGOGASTRODUODENOSCOPY (EGD) WITH PROPOFOL  N/A 07/24/2022   Procedure: ESOPHAGOGASTRODUODENOSCOPY (EGD) WITH PROPOFOL ;  Surgeon: Urban Garden, MD;  [facility];  Service: Gastroenterology;  Laterality: N/A;  12:45;ASA 1-2   HOT HEMOSTASIS  07/24/2022   Procedure: HOT HEMOSTASIS (ARGON PLASMA COAGULATION/BICAP);  Surgeon: Umberto Ganong, Bearl Limes, MD;  [facility];  Service: Gastroenterology;;   IR 3D INDEPENDENT WKST  04/25/2021   IR ANGIO INTRA EXTRACRAN SEL COM CAROTID INNOMINATE UNI R MOD SED  04/25/2021   IR ANGIO INTRA EXTRACRAN SEL INTERNAL CAROTID UNI L MOD SED  04/25/2021   IR ANGIOGRAM FOLLOW UP STUDY  04/25/2021   IR CT HEAD LTD  04/25/2021   IR NEURO EACH ADD'L AFTER BASIC UNI LEFT (MS)  04/25/2021   IR RADIOLOGIST EVAL & MGMT  03/22/2021   IR RADIOLOGIST EVAL & MGMT  03/28/2021   IR RADIOLOGIST EVAL & MGMT  05/15/2021   IR RADIOLOGIST EVAL & MGMT  09/28/2021   IR TRANSCATH/EMBOLIZ  04/25/2021   IR US  GUIDE VASC ACCESS RIGHT  04/25/2021   MYRINGOTOMY WITH TUBE PLACEMENT Right 02/04/2017   Procedure: REVISION  OF RIGHT MYRINGOTOMY WITH TUBE PLACEMENT, WITH EXAM OF LEFT EAR;  Surgeon: Reynold Caves, MD;  Location: [facility];  Service: ENT;  Laterality: Right;   NASAL SINUS SURGERY  2016   polypectomy   OPEN REDUCTION INTERNAL FIXATION (ORIF) TIBIA/FIBULA FRACTURE Left 02/02/2023   Procedure: OPEN REDUCTION INTERNAL FIXATION (ORIF) LEFT TIBIA/FIBULA FRACTURE;  Surgeon: Diedra Fowler, MD;  Location: [facility];  Service: Orthopedics;  Laterality: Left;   POLYPECTOMY  07/25/2020   Procedure: POLYPECTOMY;  Surgeon: Urban Garden, MD;  [facility];  Service: Gastroenterology;;   POLYPECTOMY  08/29/2020   Procedure: POLYPECTOMY;  Surgeon: Urban Garden, MD;  [facility];  Service: Gastroenterology;;   POLYPECTOMY  07/24/2022   Procedure: POLYPECTOMY;  Surgeon: Urban Garden, MD;  [facility];  Service: Gastroenterology;;   [facility] REMOVAL N/A 01/06/2019   Procedure: REMOVAL PORT-A-CATH;  Surgeon: Boyce Byes, MD;  Location: [facility];  Service: General;  Laterality: N/A;   PORTACATH PLACEMENT Right 06/16/2018   Procedure: INSERTION PORT-A-CATH;  Surgeon: Boyce Byes, MD;  Location: [facility];  Service: General;  Laterality: Right;   RADIOLOGY WITH ANESTHESIA N/A 04/25/2021   Procedure: IR WITH ANESTHESIA EMBOLIZATION;  Surgeon: Luellen Sages, MD;  Location: [facility];  Service: Radiology;  Laterality: N/A;   SCLEROTHERAPY  07/24/2022   Procedure: SCLEROTHERAPY;  Surgeon: Umberto Ganong, Bearl Limes, MD;  [facility];  Service: Gastroenterology;;   SUBMUCOSAL LIFTING INJECTION  08/29/2020   Procedure: SUBMUCOSAL LIFTING INJECTION;  Surgeon: Umberto Ganong, Bearl Limes, MD;  [facility];  Service: Gastroenterology;;   SUBMUCOSAL LIFTING INJECTION  07/24/2022   Procedure: SUBMUCOSAL LIFTING INJECTION;  Surgeon: Urban Garden, MD;  [facility];  Service:  Gastroenterology;;   TONSILLECTOMY  1970   Patient Active Problem List   Diagnosis Date Noted   Closed fracture of left fibula and tibia 02/02/2023   Acute diarrhea 12/23/2022   Functional dyspepsia 07/01/2022   DOE (dyspnea on exertion) 01/15/2022   Nausea with vomiting 05/28/2021   Duodenal adenoma 05/28/2021   Brain aneurysm 04/25/2021   History of herpes simplex infection 01/08/2021   Papanicolaou smear, as part of routine gynecological examination 01/08/2021   CVA (cerebral vascular accident) (HCC) 01/04/2021   Acute ischemic stroke (HCC) 01/03/2021   Hypokalemia 01/03/2021   Cerebral aneurysm 01/03/2021   Musculoskeletal pain 01/03/2021   Early satiety 11/27/2020   Chronic abdominal pain 07/13/2020   Port-A-Cath in place 06/23/2018   Malignant neoplasm of upper-inner quadrant of left breast in female, estrogen receptor positive (HCC) 04/16/2018   Essential hypertension 03/21/2015   Hyperlipidemia 03/21/2015   Genital herpes 03/21/2015   Fecal urgency 03/21/2015   GERD (gastroesophageal reflux disease) 03/21/2015    PCP: Alston Jerry, MD  REFERRING PROVIDER: Diedra Fowler,  MD  REFERRING DIAG: S82.202D,S82.402D (ICD-10-CM) - Closed fracture of left tibia and fibula with routine healing, subsequent encounter   THERAPY DIAG:  Muscle weakness (generalized)  Closed fracture of left tibia and fibula with routine healing, subsequent encounter  Impaired functional mobility, balance, gait, and endurance  Rationale for Evaluation and Treatment: Rehabilitation  ONSET DATE: 02/02/2023  SUBJECTIVE:   SUBJECTIVE STATEMENT: Arrived wearing CAM boot with shoes in bag.  No reports of pain currently.  Reports increased pain Lt knee when out of boot.  Pt had fall on the ice in Jan of 2025 and had ORIF in L tibia fibula. Had Home Health PT since then. With activity pt's pain gets 5/10.   PERTINENT HISTORY: Closed Fx of Left tibia and fibula Parkinson's Disease without  Dyskinesia, without mentation of fluctioantios Cerebral Aneurysm PAIN:  Are you having pain? No  PRECAUTIONS: Left CAM Boot-can transition to regular after a month in the boot  RED FLAGS: None   WEIGHT BEARING RESTRICTIONS: WBAT on LLE  FALLS:  Has patient fallen in last 6 months? Fall is MOI   PATIENT GOALS: "To be able to walk on my own with AD"  NEXT MD VISIT: in 3 months  OBJECTIVE:  Note: Objective measures were completed at Evaluation unless otherwise noted.  DIAGNOSTIC FINDINGS:   PATIENT SURVEYS:  LEFS 21/80 = 26.3%  COGNITION: Overall cognitive status: Within functional limits for tasks assessed     SENSATION: WFL  POSTURE: rounded shoulders and forward head  LOWER EXTREMITY ROM:  Active ROM Right eval Left eval  Hip flexion    Hip extension    Hip abduction    Hip adduction    Hip internal rotation    Hip external rotation    Knee flexion    Knee extension    Ankle dorsiflexion 15 A5  P8  Ankle plantarflexion    Ankle inversion WFL A2  P15  Ankle eversion WFL A10 20   (Blank rows = not tested)  LOWER EXTREMITY MMT:  MMT Right eval Left eval  Hip flexion    Hip extension    Hip abduction    Hip adduction    Hip internal rotation    Hip external rotation    Knee flexion    Knee extension 3+ 4+  Ankle dorsiflexion 2 4  Ankle plantarflexion 2+ 4  Ankle inversion 2+ 4  Ankle eversion 2+ 4   (Blank rows = not tested)   FUNCTIONAL TESTS:  30 Second Chair Stand Test: 5x  Norms:   Age 41-64 40-69 42-74 75-79 57-84 25-89 19-94  Women 15 15 14 13 12 11 9   Men 17 16 15 14 13 11 9    TUG: 47.96 seconds  GAIT: Distance walked: 37ft Assistive device utilized: Environmental consultant - 4 wheeled Level of assistance: SBA Comments: stepto pattern with RLE leading and then LLE  TREATMENT DATE:  06/20/23:  Change boot to  shoes (increased time, requested pt to arrive in shoes for therapy) Gait training total 234ft with SPC wearing shoes - 43ft with SPC, cueing for sequence and increase Rt LE stride length - Sit to stands with trampoline toss yellow ball-10x Standing with UE support in //bars: - Heel/toe raises 2 sets of 10 reps - Tandem stance 3x 30" on foam, intermittent HHA - Obstacle clearance in // bars: LLE on foam mat, RLE stepping over foam block and back. RUE use only, 10x, no reports of increased pain - [place]ep [place] x 5' seat 7 UE/LE   06/18/23: -NuStep, seat 9, 5'  -Heel/toe raises, 2 sets of 10 reps, on incline and BUE support -Gait training: in shoes, with SPC.   83 ft continuous, using cones as visual cues to inc step length, inc pain 5/10 -Seated PF, GTB, 3x10 -Seated eversion, GTB, 3x10 -Seated DF, GTB, 3x10 -Obstacle clearance in // bars: LLE on foam mat, RLE stepping over foam block and back. RUE use only, 10x, pt w/ inc discomfort  06/13/2023  Therapeutic Exercise: -Supine bridges 2 sets of 10 reps, 3 second holds, symptomatic, pt cued for max hip extension -Heel/toe raises, 2 sets of 10 reps, on incline and BUE support -Lateral stepping 3 laps 20 feet per lap, second 2 with RTB around ankles, pt cued for upright posture -Aeromat tandem walks in parallel bars, 3 laps, BUE support, pt cued for decreased UE support -Aeromat lateral stepping in parallel bars, 2 laps, no UE support -Rocker board in parallel bars, 2 sets of 10 reps   Therapeutic Activity: -Sit to stands with trampoline toss red ball, 2 sets of 6 reps, pt cued for core activation (second set with staggered stance on blue foam) -Step up with power up at top, 1 set of 5 reps bilaterally, pt cued for decreased UE support   06/11/23: Gait training with SPC in shoes x 72ft with cueing for 2 point sequence and encouraged to increased Rt LE stride length with SPC in shoes: 50ft Seated:  Heel and toe  raises 20x Standing:  Sit to stand 10x no HHA standard chair height  Heel raises on incline slope 2x 10  Toe raises on decline slope 2x 10  Rockerboard Lt/rt 2 minutes  Rockerboard fwd/bkwd 2 minutes  Squat front of chair 10x Slant board 3x 30" Tandem stance 1x 30" on solid surface 1x 30" on foam each     PATIENT EDUCATION:  Education details: PT Evaluation, findings, prognosis, frequency, attendance policy, and HEP. Person educated: Patient Education method: Medical illustrator Education comprehension: verbalized understanding  HOME EXERCISE PROGRAM: Access Code: [email or web address] URL: [email or web address] Date: 05/16/2023 Prepared by: Irene Mannheim  Exercises - Seated Ankle Dorsiflexion Stretch  - 1 x daily - 7 x weekly - 3 sets - 10 reps - Seated Ankle Pumps  - 1 x daily - 7 x weekly - 3 sets - 20 reps - Seated Ankle Circles  - 1 x daily - 7 x weekly - 3 sets - 26 reps - Sit to Stand with Counter Support  - 1 x daily - 7 x weekly - 3 sets - 10 reps  ASSESSMENT:  CLINICAL IMPRESSION: Pt arrived wearing CAM boot with shoes in bag to change to, therapist requested pt to arrive in shoes as reports she has been wearing them majority of the time.  Pt limited by Lt knee pain, monitored through session with no  reports of increased pain with any activity. Session foucs on improving gait mechanics with SPC, balance and strengthening.  Pt tolerated well to session with no reports of increased pain.  Cueing to improve Lt LE stance phase and increased step length with Rt LE to equalize wearing with initial 3 point sequence, able to progress to 2 point sequence.  Increased challenge with dynamic surface balance activities with intermittent UE support needed and cueing to improve posture as tendency to look at floor.  atient tolerated session well. Began with NuStep and heel/toe raises to aid in stiffness felt in L ankle and knee. During ambulation, patient continues to  display decreased stance time on LLE causing decreased step length with RLE. Session focused on gait training with visual cues help to form equal step lengths bilaterally. Patient reports inc pain in ankle joint. Followed with seated ankle strengthening. Ended with obstacle clearance on soft surface to further address stance/step length discrepancy. Consistent verbal cueing needed throughout. Only two seated breaks through session. Patient would continue to benefit from skilled physical therapy for increased endurance with ambulation, increased bilateral LE strength, and improved balance for improved quality of life, improved independence with gait training and continued progress towards therapy goals.   Patient is a 73 y.o. female who was seen today for physical therapy evaluation and treatment for S82.202D,S82.402D (ICD-10-CM) - Closed fracture of left tibia and fibula with routine healing, subsequent encounter. Pt known to this clinic from previous PT POC for balance in setting of Parkinson's. Pt with recent ORIF in L ankle, is WBAT in boot and can wean off after 4/28. Pt with noticeable functional mobility deficits and limitations in ADLs and functional mobility due ot muscle weakness, ROM deficits, pain and outcome measures above. Pt will benefit from skilled Physical Therapy services to address deficits/limitations in order to improve functional and QOL.    OBJECTIVE IMPAIRMENTS: Abnormal gait, decreased activity tolerance, decreased balance, decreased mobility, difficulty walking, decreased ROM, decreased strength, hypomobility, postural dysfunction, and pain.   ACTIVITY LIMITATIONS: carrying, lifting, bending, standing, squatting, stairs, transfers, bed mobility, and locomotion level  PARTICIPATION LIMITATIONS: meal prep, driving, community activity, occupation, and yard work  PERSONAL FACTORS: Age and 1-2 comorbidities: Parkinson's are also affecting patient's functional outcome.   REHAB  POTENTIAL: Good  CLINICAL DECISION MAKING: Stable/uncomplicated  EVALUATION COMPLEXITY: Low   GOALS: Goals reviewed with patient? Yes  SHORT TERM GOALS: Target date: 06/06/23  Pt will be independent with HEP in order to demonstrate participation in Physical Therapy POC.  Baseline: Goal status: INITIAL  2.  Pt will 3/10 pain during mobility in order to demonstrate improved pain while performing ADLs.  Baseline:  Goal status: INITIAL  LONG TERM GOALS: Target date: 06/27/23  Pt will improve 30 Second Chair Stand Test by at least 2 reps in order to demonstrate improved functional strength to return to desired activities.  Baseline: see objective.  Goal status: INITIAL  2.  Pt will improve TUG by at least 15 seconds in order to demonstrate improved functional ambulatory capacity in community setting.  Baseline: see objective.  Goal status: INITIAL  3.  Pt will improve LEFS score by 20% in order to demonstrate improved pain with functional goals and outcomes. Baseline: see objective.  Goal status: INITIAL  4.  Pt will improve LLE ankle ROM by  at least 5 degrees in order to improve symmetry during functional activities.. Baseline: see objective.  Goal status: INITIAL  5.  Pt will improve BLE MMT at  least 1/2 grade  in order to improve endurance, balance and tolerance during functional activities.. Baseline: see objective.  Goal status: INITIAL   PLAN:  PT FREQUENCY: 2x/week  PT DURATION: 6 weeks  PLANNED INTERVENTIONS: 97164- PT Re-evaluation, 97750- Physical Performance Testing, 97110-Therapeutic exercises, 97530- Therapeutic activity, 97112- Neuromuscular re-education, [phone]- Self Care, [id number]- Manual therapy, [phone]- Gait training, [phone]- Electrical stimulation (unattended), [phone]- Electrical stimulation (manual), Patient/Family education, Balance training, Stair training, Joint mobilization, Joint manipulation, Spinal manipulation, Spinal mobilization, DME instructions,  Cryotherapy, and Moist heat  PLAN FOR NEXT SESSION: Left ankle ROM, functional strengthening, inc WB activities out of boot to pt tolerance   Minor Amble, LPTA/CLT; CBIS [phone]  3:47 PM, 06/20/23

## 2023-06-24 ENCOUNTER — Ambulatory Visit (HOSPITAL_COMMUNITY): Attending: Adult Health

## 2023-06-24 ENCOUNTER — Encounter (HOSPITAL_COMMUNITY): Payer: Self-pay

## 2023-06-24 DIAGNOSIS — S82402D Unspecified fracture of shaft of left fibula, subsequent encounter for closed fracture with routine healing: Secondary | ICD-10-CM | POA: Diagnosis not present

## 2023-06-24 DIAGNOSIS — S82202D Unspecified fracture of shaft of left tibia, subsequent encounter for closed fracture with routine healing: Secondary | ICD-10-CM | POA: Diagnosis not present

## 2023-06-24 DIAGNOSIS — M6281 Muscle weakness (generalized): Secondary | ICD-10-CM | POA: Insufficient documentation

## 2023-06-24 DIAGNOSIS — Z7409 Other reduced mobility: Secondary | ICD-10-CM | POA: Diagnosis not present

## 2023-06-24 DIAGNOSIS — R2681 Unsteadiness on feet: Secondary | ICD-10-CM | POA: Insufficient documentation

## 2023-06-24 NOTE — Therapy (Signed)
 OUTPATIENT PHYSICAL THERAPY LOWER EXTREMITY TREATMENT  Patient Name: Kimberly Mccormick MRN: [id number] DOB:Dec 11, 1950, 73 y.o., female Today's Date: 06/25/2023  END OF SESSION:  PT End of Session - 06/24/23 1146     Visit Number 10    Number of Visits 18    Date for PT Re-Evaluation 07/25/23    Authorization Type [employer] Dual Complete    Authorization Time Period no auth; no limit    Progress Note Due on Visit 18    PT Start Time 1147    PT Stop Time 1236    PT Time Calculation (min) 49 min    Equipment Utilized During Treatment Gait belt    Activity Tolerance Patient tolerated treatment well    Behavior During Therapy WFL for tasks assessed/performed               Past Medical History:  Diagnosis Date   Aneurysm of left internal carotid artery    Small supraclinoid 5 mm   Breast cancer (HCC)    Left s/p lumpectomy (05/06/2018) and adjuvant chemotherapy (06/23/2018) with Adriamycin and Cytoxan  x4 followed by Taxol weekly x12   DDD (degenerative disc disease), lumbar    Essential hypertension    Genital warts    GERD (gastroesophageal reflux disease)    History of anemia    History of renal insufficiency    Hypertension    Insomnia    Iron deficiency anemia    Ischemic stroke [facility])    December 2022   Mixed hyperlipidemia    Osteoarthritis    Parkinson's disease (HCC)    Peripheral neuropathy    Related to chemotherapy   Personal history of chemotherapy 2020   Past Surgical History:  Procedure Laterality Date   APPENDECTOMY  1966   BIOPSY  08/29/2020   Procedure: BIOPSY;  Surgeon: Urban Garden, MD;  [facility];  Service: Gastroenterology;;   BIOPSY  07/24/2022   Procedure: BIOPSY;  Surgeon: Urban Garden, MD;  [facility];  Service: Gastroenterology;;   BREAST LUMPECTOMY WITH RADIOACTIVE SEED AND SENTINEL LYMPH NODE BIOPSY Left 05/06/2018   Procedure: LEFT BREAST LUMPECTOMY WITH RADIOACTIVE SEED AND LEFT DEEP AXILLARY  SENTINEL LYMPH NODE BIOPSY AND BLUE DYE INJECTION;  Surgeon: Boyce Byes, MD;  Location:  SURGERY CENTER;  Service: General;  Laterality: Left;   CATARACT EXTRACTION W/PHACO Left 01/10/2014   Procedure: CATARACT EXTRACTION PHACO AND INTRAOCULAR LENS PLACEMENT ; CDE:  4.94;  Surgeon: Clay Cummins, MD;  Location: [facility];  Service: Ophthalmology;  Laterality: Left;   CESAREAN SECTION  1986   CHOLECYSTECTOMY     COLONOSCOPY WITH PROPOFOL  N/A 07/25/2020   Procedure: COLONOSCOPY WITH PROPOFOL ;  Surgeon: Urban Garden, MD;  [facility];  Service: Gastroenterology;  Laterality: N/A;  10:55   ESOPHAGOGASTRODUODENOSCOPY (EGD) WITH PROPOFOL  N/A 08/29/2020   Procedure: ESOPHAGOGASTRODUODENOSCOPY (EGD) WITH PROPOFOL ;  Surgeon: Urban Garden, MD;  [facility];  Service: Gastroenterology;  Laterality: N/A;  12:00   ESOPHAGOGASTRODUODENOSCOPY (EGD) WITH PROPOFOL  N/A 07/24/2022   Procedure: ESOPHAGOGASTRODUODENOSCOPY (EGD) WITH PROPOFOL ;  Surgeon: Urban Garden, MD;  [facility];  Service: Gastroenterology;  Laterality: N/A;  12:45;ASA 1-2   HOT HEMOSTASIS  07/24/2022   Procedure: HOT HEMOSTASIS (ARGON PLASMA COAGULATION/BICAP);  Surgeon: Umberto Ganong, Bearl Limes, MD;  [facility];  Service: Gastroenterology;;   IR 3D INDEPENDENT WKST  04/25/2021   IR ANGIO INTRA EXTRACRAN SEL COM CAROTID INNOMINATE UNI R MOD SED  04/25/2021   IR ANGIO INTRA EXTRACRAN SEL INTERNAL CAROTID UNI L MOD SED  04/25/2021   IR ANGIOGRAM FOLLOW UP STUDY  04/25/2021   IR CT HEAD LTD  04/25/2021   IR NEURO EACH ADD'L AFTER BASIC UNI LEFT (MS)  04/25/2021   IR RADIOLOGIST EVAL & MGMT  03/22/2021   IR RADIOLOGIST EVAL & MGMT  03/28/2021   IR RADIOLOGIST EVAL & MGMT  05/15/2021   IR RADIOLOGIST EVAL & MGMT  09/28/2021   IR TRANSCATH/EMBOLIZ  04/25/2021   IR US  GUIDE VASC ACCESS RIGHT  04/25/2021   MYRINGOTOMY WITH TUBE PLACEMENT Right 02/04/2017   Procedure: REVISION  OF RIGHT MYRINGOTOMY WITH TUBE PLACEMENT, WITH EXAM OF LEFT EAR;  Surgeon: Reynold Caves, MD;  Location: [facility];  Service: ENT;  Laterality: Right;   NASAL SINUS SURGERY  2016   polypectomy   OPEN REDUCTION INTERNAL FIXATION (ORIF) TIBIA/FIBULA FRACTURE Left 02/02/2023   Procedure: OPEN REDUCTION INTERNAL FIXATION (ORIF) LEFT TIBIA/FIBULA FRACTURE;  Surgeon: Diedra Fowler, MD;  Location: [facility];  Service: Orthopedics;  Laterality: Left;   POLYPECTOMY  07/25/2020   Procedure: POLYPECTOMY;  Surgeon: Urban Garden, MD;  [facility];  Service: Gastroenterology;;   POLYPECTOMY  08/29/2020   Procedure: POLYPECTOMY;  Surgeon: Urban Garden, MD;  [facility];  Service: Gastroenterology;;   POLYPECTOMY  07/24/2022   Procedure: POLYPECTOMY;  Surgeon: Urban Garden, MD;  [facility];  Service: Gastroenterology;;   [facility] REMOVAL N/A 01/06/2019   Procedure: REMOVAL PORT-A-CATH;  Surgeon: Boyce Byes, MD;  Location: [facility];  Service: General;  Laterality: N/A;   PORTACATH PLACEMENT Right 06/16/2018   Procedure: INSERTION PORT-A-CATH;  Surgeon: Boyce Byes, MD;  Location: [facility];  Service: General;  Laterality: Right;   RADIOLOGY WITH ANESTHESIA N/A 04/25/2021   Procedure: IR WITH ANESTHESIA EMBOLIZATION;  Surgeon: Luellen Sages, MD;  Location: [facility];  Service: Radiology;  Laterality: N/A;   SCLEROTHERAPY  07/24/2022   Procedure: SCLEROTHERAPY;  Surgeon: Umberto Ganong, Bearl Limes, MD;  [facility];  Service: Gastroenterology;;   SUBMUCOSAL LIFTING INJECTION  08/29/2020   Procedure: SUBMUCOSAL LIFTING INJECTION;  Surgeon: Umberto Ganong, Bearl Limes, MD;  [facility];  Service: Gastroenterology;;   SUBMUCOSAL LIFTING INJECTION  07/24/2022   Procedure: SUBMUCOSAL LIFTING INJECTION;  Surgeon: Urban Garden, MD;  [facility];  Service:  Gastroenterology;;   TONSILLECTOMY  1970   Patient Active Problem List   Diagnosis Date Noted   Closed fracture of left fibula and tibia 02/02/2023   Acute diarrhea 12/23/2022   Functional dyspepsia 07/01/2022   DOE (dyspnea on exertion) 01/15/2022   Nausea with vomiting 05/28/2021   Duodenal adenoma 05/28/2021   Brain aneurysm 04/25/2021   History of herpes simplex infection 01/08/2021   Papanicolaou smear, as part of routine gynecological examination 01/08/2021   CVA (cerebral vascular accident) (HCC) 01/04/2021   Acute ischemic stroke (HCC) 01/03/2021   Hypokalemia 01/03/2021   Cerebral aneurysm 01/03/2021   Musculoskeletal pain 01/03/2021   Early satiety 11/27/2020   Chronic abdominal pain 07/13/2020   Port-A-Cath in place 06/23/2018   Malignant neoplasm of upper-inner quadrant of left breast in female, estrogen receptor positive (HCC) 04/16/2018   Essential hypertension 03/21/2015   Hyperlipidemia 03/21/2015   Genital herpes 03/21/2015   Fecal urgency 03/21/2015   GERD (gastroesophageal reflux disease) 03/21/2015    PCP: Alston Jerry, MD  REFERRING PROVIDER: Diedra Fowler,  MD  REFERRING DIAG: S82.202D,S82.402D (ICD-10-CM) - Closed fracture of left tibia and fibula with routine healing, subsequent encounter   THERAPY DIAG:  Muscle weakness (generalized)  Closed fracture of left tibia and fibula with routine healing, subsequent encounter  Impaired functional mobility, balance, gait, and endurance  Rationale for Evaluation and Treatment: Rehabilitation  ONSET DATE: 02/02/2023  SUBJECTIVE:   SUBJECTIVE STATEMENT: 06/24/23: Pt arrived with shoe on but unable lace the shoes due to the foot swollen today.  Stated she has been in shoes all week and has been swollen.  Main pain in Lt knee, pain scale 3/10.  Reports compliance with HEP daily.  Pt feels she has improved by 40-50%.  Reports she has returned to driving about a week ago, has drove out to church and to  pharmacy.  Pt had fall on the ice in Jan of 2025 and had ORIF in L tibia fibula. Had Home Health PT since then. With activity pt's pain gets 5/10.   PERTINENT HISTORY: Closed Fx of Left tibia and fibula Parkinson's Disease without Dyskinesia, without mentation of fluctioantios Cerebral Aneurysm PAIN:  Are you having pain? No  PRECAUTIONS: Left CAM Boot-can transition to regular after a month in the boot  RED FLAGS: None   WEIGHT BEARING RESTRICTIONS: WBAT on LLE  FALLS:  Has patient fallen in last 6 months? Fall is MOI   PATIENT GOALS: "To be able to walk on my own with AD"  NEXT MD VISIT: in 3 months  OBJECTIVE:  Note: Objective measures were completed at Evaluation unless otherwise noted.  DIAGNOSTIC FINDINGS:   PATIENT SURVEYS:  LEFS 21/80 = 26.3%  06/24/23: 29/80=  36.3% COGNITION: Overall cognitive status: Within functional limits for tasks assessed     SENSATION: WFL  POSTURE: rounded shoulders and forward head  LOWER EXTREMITY ROM:  Active ROM Right eval Left eval Left 06/24/23:  Hip flexion     Hip extension     Hip abduction     Hip adduction     Hip internal rotation     Hip external rotation     Knee flexion     Knee extension     Ankle dorsiflexion 15 A5  P8 A8 I P12  Ankle plantarflexion   A 8   Ankle inversion WFL A2  P15 20  Ankle eversion WFL A10 20 A12 I P 20   (Blank rows = not tested)  LOWER EXTREMITY MMT:  MMT Right eval Left eval Right  06/24/23 Left 06/24/23  Hip flexion      Hip extension      Hip abduction      Hip adduction      Hip internal rotation      Hip external rotation      Knee flexion      Knee extension 3+ 4+ 4+ 4  Ankle dorsiflexion 2 4 4  3-  Ankle plantarflexion 2+ 4 4 3   Ankle inversion 2+ 4 4 3   Ankle eversion 2+ 4 4 3-   (Blank rows = not tested)   FUNCTIONAL TESTS:  30 Second Chair Stand Test: 5x  Norms:   Age 3-64 11-69 70-74 75-79 80-84 85-89 90-94  Women 15 15 14 13 12 11 9   Men 17 16  15 14 13 11 9    TUG: 47.96 seconds   06/24/23: 117ft with SPC in shoe 30 Second Chair Stand Test: 8x no HHA TUG: 24.07" GAIT: Distance walked: 16ft Assistive device utilized: Environmental consultant - 4 wheeled  Level of assistance: SBA Comments: stepto pattern with RLE leading and then LLE                                                                                                                                TREATMENT DATE:  06/24/23:   06/24/23: 166ft with SPC in shoe 30 Second Chair Stand Test: 8x no HHA TUG: 24.07" LEFS 06/24/23: 29/80=  36.3% ROM MMT  Heel raises 2x 10 Toe raises 2x10  Manual decongestive massage for edema control with LE elevated, PROM Pt able to tie shoe at EOS.    06/20/23:  Change boot to shoes (increased time, requested pt to arrive in shoes for therapy) Gait training total 285ft with SPC wearing shoes - 52ft with SPC, cueing for sequence and increase Rt LE stride length - Sit to stands with trampoline toss yellow ball-10x Standing with UE support in //bars: - Heel/toe raises 2 sets of 10 reps - Tandem stance 3x 30" on foam, intermittent HHA - Obstacle clearance in // bars: LLE on foam mat, RLE stepping over foam block and back. RUE use only, 10x, no reports of increased pain - [place]ustep [place] x 5' seat 7 UE/LE   06/18/23: -NuStep, seat 9, 5'  -Heel/toe raises, 2 sets of 10 reps, on incline and BUE support -Gait training: in shoes, with SPC.   83 ft continuous, using cones as visual cues to inc step length, inc pain 5/10 -Seated PF, GTB, 3x10 -Seated eversion, GTB, 3x10 -Seated DF, GTB, 3x10 -Obstacle clearance in // bars: LLE on foam mat, RLE stepping over foam block and back. RUE use only, 10x, pt w/ inc discomfort  06/13/2023  Therapeutic Exercise: -Supine bridges 2 sets of 10 reps, 3 second holds, symptomatic, pt cued for max hip extension -Heel/toe raises, 2 sets of 10 reps, on incline and BUE support -Lateral stepping 3  laps 20 feet per lap, second 2 with RTB around ankles, pt cued for upright posture -Aeromat tandem walks in parallel bars, 3 laps, BUE support, pt cued for decreased UE support -Aeromat lateral stepping in parallel bars, 2 laps, no UE support -Rocker board in parallel bars, 2 sets of 10 reps   Therapeutic Activity: -Sit to stands with trampoline toss red ball, 2 sets of 6 reps, pt cued for core activation (second set with staggered stance on blue foam) -Step up with power up at top, 1 set of 5 reps bilaterally, pt cued for decreased UE support   06/11/23: Gait training with SPC in shoes x 61ft with cueing for 2 point sequence and encouraged to increased Rt LE stride length with SPC in shoes: 77ft Seated:  Heel and toe raises 20x Standing:  Sit to stand 10x no HHA standard chair height  Heel raises on incline slope 2x 10  Toe raises on decline slope 2x 10  Rockerboard Lt/rt 2 minutes  Rockerboard fwd/bkwd 2 minutes  Squat front of chair 10x Slant board 3x 30" Tandem stance 1x 30" on solid surface 1x 30" on foam each     PATIENT EDUCATION:  Education details: PT Evaluation, findings, prognosis, frequency, attendance policy, and HEP. Person educated: Patient Education method: Medical illustrator Education comprehension: verbalized understanding  HOME EXERCISE PROGRAM: Access Code: [email or web address] URL: [email or web address] Date: 05/16/2023 Prepared by: Irene Mannheim  Exercises - Seated Ankle Dorsiflexion Stretch  - 1 x daily - 7 x weekly - 3 sets - 10 reps - Seated Ankle Pumps  - 1 x daily - 7 x weekly - 3 sets - 20 reps - Seated Ankle Circles  - 1 x daily - 7 x weekly - 3 sets - 26 reps - Sit to Stand with Counter Support  - 1 x daily - 7 x weekly - 3 sets - 10 reps  ASSESSMENT:  CLINICAL IMPRESSION: 10th visit progress note with the following findings:  Pt has met 1/2 STG and 2/5 LTGs.  Reports compliance with HEP though therapist questions  frequency as unable to demonstrate what she is doing at home.  Pt now ambulating more in shoes that CAM boot, increased cadence with use of SPC 3 point sequence.  Some improvements noted with ROM and some strength though both continue to be impaired.  Increased edema present today.  Discuss weaning out of CAM boot, elevation, ankle pumps and compression garments to assist with edema.  EOS with manual to assist with edema control and PROM for mobility.  Pt will continue to benefit from skilled intervention to address goals unmet.     Patient is a 73 y.o. female who was seen today for physical therapy evaluation and treatment for S82.202D,S82.402D (ICD-10-CM) - Closed fracture of left tibia and fibula with routine healing, subsequent encounter. Pt known to this clinic from previous PT POC for balance in setting of Parkinson's. Pt with recent ORIF in L ankle, is WBAT in boot and can wean off after 4/28. Pt with noticeable functional mobility deficits and limitations in ADLs and functional mobility due ot muscle weakness, ROM deficits, pain and outcome measures above. Pt will benefit from skilled Physical Therapy services to address deficits/limitations in order to improve functional and QOL.    OBJECTIVE IMPAIRMENTS: Abnormal gait, decreased activity tolerance, decreased balance, decreased mobility, difficulty walking, decreased ROM, decreased strength, hypomobility, postural dysfunction, and pain.   ACTIVITY LIMITATIONS: carrying, lifting, bending, standing, squatting, stairs, transfers, bed mobility, and locomotion level  PARTICIPATION LIMITATIONS: meal prep, driving, community activity, occupation, and yard work  PERSONAL FACTORS: Age and 1-2 comorbidities: Parkinson's are also affecting patient's functional outcome.   REHAB POTENTIAL: Good  CLINICAL DECISION MAKING: Stable/uncomplicated  EVALUATION COMPLEXITY: Low   GOALS: Goals reviewed with patient? Yes  SHORT TERM GOALS: Target date:  07/11/23  Pt will be independent with HEP in order to demonstrate participation in Physical Therapy POC.  Baseline:  06/24/23:  Reports compliance with HEP. Goal status: MET  2.  Pt will 3/10 pain during mobility in order to demonstrate improved pain while performing ADLs.  Baseline: 06/24/23:  Pain scale average 2-8/10 Goal status: INITIAL  LONG TERM GOALS: Target date: 07/25/23  Pt will improve 30 Second Chair Stand Test by at least 2 reps in order to demonstrate improved functional strength to return to desired activities.  Baseline: see objective.; 06/24/23:  able to complete 8 times no HHA, was 5 initial eval. Goal status: MET  2.  Pt will improve TUG by at least 15  seconds in order to demonstrate improved functional ambulatory capacity in community setting.  Baseline: see objective. 07/14/23:  Improved to 24.07" was 47.96 seconds Goal status: MET  3.  Pt will improve LEFS score by 20% in order to demonstrate improved pain with functional goals and outcomes. Baseline: see objective. ; 06/24/23: 29/80=  36.3% was LEFS 21/80 = 26.3% Goal status: IN PROGRESS  4.  Pt will improve LLE ankle ROM by  at least 5 degrees in order to improve symmetry during functional activities.. Baseline: see objective.  Goal status: IN PROGRESS  5.  Pt will improve BLE MMT at least 1/2 grade  in order to improve endurance, balance and tolerance during functional activities.. Baseline: see objective.  Goal status: IN PROGRESS   PLAN:  PT FREQUENCY: 2x/week  PT DURATION: 10 weeks  PLANNED INTERVENTIONS: 97164- PT Re-evaluation, 97750- Physical Performance Testing, 97110-Therapeutic exercises, 97530- Therapeutic activity, 97112- Neuromuscular re-education, [phone]- Self Care, 60454- Manual therapy, [phone]- Gait training, [phone]- Electrical stimulation (unattended), [phone]- Electrical stimulation (manual), Patient/Family education, Balance training, Stair training, Joint mobilization, Joint manipulation, Spinal  manipulation, Spinal mobilization, DME instructions, Cryotherapy, and Moist heat  PLAN FOR NEXT SESSION: Left ankle ROM, functional strengthening, inc WB activities out of boot to pt tolerance   Minor Amble, LPTA/CLT; CBIS [phone]  9:19 AM, 06/25/23

## 2023-06-25 ENCOUNTER — Encounter (HOSPITAL_COMMUNITY): Payer: Self-pay | Admitting: Family Medicine

## 2023-06-25 ENCOUNTER — Encounter (HOSPITAL_COMMUNITY): Payer: Self-pay | Admitting: Hematology and Oncology

## 2023-06-25 DIAGNOSIS — Z1231 Encounter for screening mammogram for malignant neoplasm of breast: Secondary | ICD-10-CM

## 2023-06-25 NOTE — Addendum Note (Signed)
 Addended by: Marysue Sola E on: 06/25/2023 09:22 AM   Modules accepted: Orders

## 2023-06-26 ENCOUNTER — Encounter (HOSPITAL_COMMUNITY): Payer: Self-pay

## 2023-06-26 ENCOUNTER — Ambulatory Visit (HOSPITAL_COMMUNITY)

## 2023-06-26 DIAGNOSIS — S82402D Unspecified fracture of shaft of left fibula, subsequent encounter for closed fracture with routine healing: Secondary | ICD-10-CM | POA: Diagnosis not present

## 2023-06-26 DIAGNOSIS — S82202D Unspecified fracture of shaft of left tibia, subsequent encounter for closed fracture with routine healing: Secondary | ICD-10-CM

## 2023-06-26 DIAGNOSIS — R2681 Unsteadiness on feet: Secondary | ICD-10-CM | POA: Diagnosis not present

## 2023-06-26 DIAGNOSIS — M6281 Muscle weakness (generalized): Secondary | ICD-10-CM | POA: Diagnosis not present

## 2023-06-26 DIAGNOSIS — Z7409 Other reduced mobility: Secondary | ICD-10-CM

## 2023-06-26 NOTE — Therapy (Addendum)
 OUTPATIENT PHYSICAL THERAPY LOWER EXTREMITY TREATMENT  Patient Name: KIMILA PAPALEO MRN: [id number] DOB:09/17/1950, 73 y.o., female Today's Date: 06/26/2023  END OF SESSION:  PT End of Session - 06/26/23 1157     Visit Number 11    Number of Visits 18    Date for PT Re-Evaluation 07/25/23    Authorization Type [employer] Dual Complete    Authorization Time Period no auth; no limit    Progress Note Due on Visit 18    PT Start Time 1157    PT Stop Time 1225    PT Time Calculation (min) 28 min    Equipment Utilized During Treatment Gait belt    Activity Tolerance Patient tolerated treatment well    Behavior During Therapy WFL for tasks assessed/performed                Past Medical History:  Diagnosis Date   Aneurysm of left internal carotid artery    Small supraclinoid 5 mm   Breast cancer (HCC)    Left s/p lumpectomy (05/06/2018) and adjuvant chemotherapy (06/23/2018) with Adriamycin and Cytoxan  x4 followed by Taxol weekly x12   DDD (degenerative disc disease), lumbar    Essential hypertension    Genital warts    GERD (gastroesophageal reflux disease)    History of anemia    History of renal insufficiency    Hypertension    Insomnia    Iron deficiency anemia    Ischemic stroke [facility])    December 2022   Mixed hyperlipidemia    Osteoarthritis    Parkinson's disease (HCC)    Peripheral neuropathy    Related to chemotherapy   Personal history of chemotherapy 2020   Past Surgical History:  Procedure Laterality Date   APPENDECTOMY  1966   BIOPSY  08/29/2020   Procedure: BIOPSY;  Surgeon: Urban Garden, MD;  [facility];  Service: Gastroenterology;;   BIOPSY  07/24/2022   Procedure: BIOPSY;  Surgeon: Urban Garden, MD;  [facility];  Service: Gastroenterology;;   BREAST LUMPECTOMY WITH RADIOACTIVE SEED AND SENTINEL LYMPH NODE BIOPSY Left 05/06/2018   Procedure: LEFT BREAST LUMPECTOMY WITH RADIOACTIVE SEED AND LEFT DEEP AXILLARY  SENTINEL LYMPH NODE BIOPSY AND BLUE DYE INJECTION;  Surgeon: Boyce Byes, MD;  Location: [facility];  Service: General;  Laterality: Left;   CATARACT EXTRACTION W/PHACO Left 01/10/2014   Procedure: CATARACT EXTRACTION PHACO AND INTRAOCULAR LENS PLACEMENT ; CDE:  4.94;  Surgeon: Clay Cummins, MD;  Location: [facility];  Service: Ophthalmology;  Laterality: Left;   CESAREAN SECTION  1986   CHOLECYSTECTOMY     COLONOSCOPY WITH PROPOFOL  N/A 07/25/2020   Procedure: COLONOSCOPY WITH PROPOFOL ;  Surgeon: Urban Garden, MD;  [facility];  Service: Gastroenterology;  Laterality: N/A;  10:55   ESOPHAGOGASTRODUODENOSCOPY (EGD) WITH PROPOFOL  N/A 08/29/2020   Procedure: ESOPHAGOGASTRODUODENOSCOPY (EGD) WITH PROPOFOL ;  Surgeon: Urban Garden, MD;  [facility];  Service: Gastroenterology;  Laterality: N/A;  12:00   ESOPHAGOGASTRODUODENOSCOPY (EGD) WITH PROPOFOL  N/A 07/24/2022   Procedure: ESOPHAGOGASTRODUODENOSCOPY (EGD) WITH PROPOFOL ;  Surgeon: Urban Garden, MD;  [facility];  Service: Gastroenterology;  Laterality: N/A;  12:45;ASA 1-2   HOT HEMOSTASIS  07/24/2022   Procedure: HOT HEMOSTASIS (ARGON PLASMA COAGULATION/BICAP);  Surgeon: Umberto Ganong, Bearl Limes, MD;  [facility];  Service: Gastroenterology;;   IR 3D INDEPENDENT WKST  04/25/2021   IR ANGIO INTRA EXTRACRAN SEL COM CAROTID INNOMINATE UNI R MOD  SED  04/25/2021   IR ANGIO INTRA EXTRACRAN SEL INTERNAL CAROTID UNI L MOD SED  04/25/2021   IR ANGIOGRAM FOLLOW UP STUDY  04/25/2021   IR CT HEAD LTD  04/25/2021   IR NEURO EACH ADD'L AFTER BASIC UNI LEFT (MS)  04/25/2021   IR RADIOLOGIST EVAL & MGMT  03/22/2021   IR RADIOLOGIST EVAL & MGMT  03/28/2021   IR RADIOLOGIST EVAL & MGMT  05/15/2021   IR RADIOLOGIST EVAL & MGMT  09/28/2021   IR TRANSCATH/EMBOLIZ  04/25/2021   IR US  GUIDE VASC ACCESS RIGHT  04/25/2021   MYRINGOTOMY WITH TUBE PLACEMENT Right 02/04/2017   Procedure: REVISION  OF RIGHT MYRINGOTOMY WITH TUBE PLACEMENT, WITH EXAM OF LEFT EAR;  Surgeon: Reynold Caves, MD;  Location: [facility];  Service: ENT;  Laterality: Right;   NASAL SINUS SURGERY  2016   polypectomy   OPEN REDUCTION INTERNAL FIXATION (ORIF) TIBIA/FIBULA FRACTURE Left 02/02/2023   Procedure: OPEN REDUCTION INTERNAL FIXATION (ORIF) LEFT TIBIA/FIBULA FRACTURE;  Surgeon: Diedra Fowler, MD;  Location: [facility];  Service: Orthopedics;  Laterality: Left;   POLYPECTOMY  07/25/2020   Procedure: POLYPECTOMY;  Surgeon: Urban Garden, MD;  [facility];  Service: Gastroenterology;;   POLYPECTOMY  08/29/2020   Procedure: POLYPECTOMY;  Surgeon: Urban Garden, MD;  [facility];  Service: Gastroenterology;;   POLYPECTOMY  07/24/2022   Procedure: POLYPECTOMY;  Surgeon: Urban Garden, MD;  [facility];  Service: Gastroenterology;;   [facility] REMOVAL N/A 01/06/2019   Procedure: REMOVAL PORT-A-CATH;  Surgeon: Boyce Byes, MD;  Location: [facility];  Service: General;  Laterality: N/A;   PORTACATH PLACEMENT Right 06/16/2018   Procedure: INSERTION PORT-A-CATH;  Surgeon: Boyce Byes, MD;  Location: [facility];  Service: General;  Laterality: Right;   RADIOLOGY WITH ANESTHESIA N/A 04/25/2021   Procedure: IR WITH ANESTHESIA EMBOLIZATION;  Surgeon: Luellen Sages, MD;  Location: [facility];  Service: Radiology;  Laterality: N/A;   SCLEROTHERAPY  07/24/2022   Procedure: SCLEROTHERAPY;  Surgeon: Umberto Ganong, Bearl Limes, MD;  [facility];  Service: Gastroenterology;;   SUBMUCOSAL LIFTING INJECTION  08/29/2020   Procedure: SUBMUCOSAL LIFTING INJECTION;  Surgeon: Umberto Ganong, Bearl Limes, MD;  [facility];  Service: Gastroenterology;;   SUBMUCOSAL LIFTING INJECTION  07/24/2022   Procedure: SUBMUCOSAL LIFTING INJECTION;  Surgeon: Urban Garden, MD;  [facility];  Service:  Gastroenterology;;   TONSILLECTOMY  1970   Patient Active Problem List   Diagnosis Date Noted   Closed fracture of left fibula and tibia 02/02/2023   Acute diarrhea 12/23/2022   Functional dyspepsia 07/01/2022   DOE (dyspnea on exertion) 01/15/2022   Nausea with vomiting 05/28/2021   Duodenal adenoma 05/28/2021   Brain aneurysm 04/25/2021   History of herpes simplex infection 01/08/2021   Papanicolaou smear, as part of routine gynecological examination 01/08/2021   CVA (cerebral vascular accident) (HCC) 01/04/2021   Acute ischemic stroke (HCC) 01/03/2021   Hypokalemia 01/03/2021   Cerebral aneurysm 01/03/2021   Musculoskeletal pain 01/03/2021   Early satiety 11/27/2020   Chronic abdominal pain 07/13/2020   Port-A-Cath in place 06/23/2018   Malignant neoplasm of upper-inner quadrant of left breast in female, estrogen receptor positive (HCC) 04/16/2018   Essential hypertension 03/21/2015   Hyperlipidemia 03/21/2015   Genital herpes 03/21/2015   Fecal urgency 03/21/2015   GERD (gastroesophageal reflux disease) 03/21/2015    PCP: Alston Jerry, MD  REFERRING PROVIDER: Sulema Endo,  Asenath Blacker, MD  REFERRING DIAG: S82.202D,S82.402D (ICD-10-CM) - Closed fracture of left tibia and fibula with routine healing, subsequent encounter   THERAPY DIAG:  Muscle weakness (generalized)  Closed fracture of left tibia and fibula with routine healing, subsequent encounter  Impaired functional mobility, balance, gait, and endurance  Rationale for Evaluation and Treatment: Rehabilitation  ONSET DATE: 02/02/2023  SUBJECTIVE:   SUBJECTIVE STATEMENT: Pt late upon arrival, states time is hard for son since he works third shift. Pt reports she is feeling a little better. Pt presents with compression sock off and shoe untied due to swelling. Pt reports compliance with HEP most days.   Pt had fall on the ice in Jan of 2025 and had ORIF in L tibia fibula. Had Home Health PT since then. With  activity pt's pain gets 5/10.   PERTINENT HISTORY: Closed Fx of Left tibia and fibula Parkinson's Disease without Dyskinesia, without mentation of fluctioantios Cerebral Aneurysm PAIN:  Are you having pain? No  PRECAUTIONS: Left CAM Boot-can transition to regular after a month in the boot  RED FLAGS: None   WEIGHT BEARING RESTRICTIONS: WBAT on LLE  FALLS:  Has patient fallen in last 6 months? Fall is MOI   PATIENT GOALS: "To be able to walk on my own with AD"  NEXT MD VISIT: in 3 months  OBJECTIVE:  Note: Objective measures were completed at Evaluation unless otherwise noted.  DIAGNOSTIC FINDINGS:   PATIENT SURVEYS:  LEFS 21/80 = 26.3%  06/24/23: 29/80=  36.3% COGNITION: Overall cognitive status: Within functional limits for tasks assessed     SENSATION: WFL  POSTURE: rounded shoulders and forward head  LOWER EXTREMITY ROM:  Active ROM Right eval Left eval Left 06/24/23:  Hip flexion     Hip extension     Hip abduction     Hip adduction     Hip internal rotation     Hip external rotation     Knee flexion     Knee extension     Ankle dorsiflexion 15 A5  P8 A8 I P12  Ankle plantarflexion   A 8   Ankle inversion WFL A2  P15 20  Ankle eversion WFL A10 20 A12 I P 20   (Blank rows = not tested)  LOWER EXTREMITY MMT:  MMT Right eval Left eval Right  06/24/23 Left 06/24/23  Hip flexion      Hip extension      Hip abduction      Hip adduction      Hip internal rotation      Hip external rotation      Knee flexion      Knee extension 3+ 4+ 4+ 4  Ankle dorsiflexion 2 4 4  3-  Ankle plantarflexion 2+ 4 4 3   Ankle inversion 2+ 4 4 3   Ankle eversion 2+ 4 4 3-   (Blank rows = not tested)   FUNCTIONAL TESTS:  30 Second Chair Stand Test: 5x  Norms:   Age 52-64 49-69 70-74 75-79 80-84 85-89 90-94  Women 15 15 14 13 12 11 9   Men 17 16 15 14 13 11 9    TUG: 47.96 seconds   06/24/23: 165ft with SPC in shoe 30 Second Chair Stand Test: 8x no  HHA TUG: 24.07" GAIT: Distance walked: 78ft Assistive device utilized: Walker - 4 wheeled Level of assistance: SBA Comments: stepto pattern with RLE leading and then LLE  TREATMENT DATE:  06/26/2023  Manual Therapy: -PROM  of L ankle, plantar flexion, dorsiflexion, inversion and eversion -STM of Left ankle, surgical site, edema techniques Therapeutic Exercise: -Supine Ankle AROM each direction 20 reps following manual therapy -Heel/toe raises, 2 sets of 10 reps, on incline and BUE support -Stair ambulation, 2 bouts 4 stairs, BUE support needed, reciprocal pattern -BAPS board standing with BUE support, therapist hand assist with board, circles clockwise/counter clockwise and DF/PF taps and EV/IN taps, 10 each direction  06/24/23:   06/24/23: 118ft with SPC in shoe 30 Second Chair Stand Test: 8x no HHA TUG: 24.07" LEFS 06/24/23: 29/80=  36.3% ROM MMT  Heel raises 2x 10 Toe raises 2x10  Manual decongestive massage for edema control with LE elevated, PROM Pt able to tie shoe at EOS.    06/20/23:  Change boot to shoes (increased time, requested pt to arrive in shoes for therapy) Gait training total 285ft with SPC wearing shoes - 44ft with SPC, cueing for sequence and increase Rt LE stride length - Sit to stands with trampoline toss yellow ball-10x Standing with UE support in //bars: - Heel/toe raises 2 sets of 10 reps - Tandem stance 3x 30" on foam, intermittent HHA - Obstacle clearance in // bars: LLE on foam mat, RLE stepping over foam block and back. RUE use only, 10x, no reports of increased pain - [place] x 5' seat 7 UE/LE     PATIENT EDUCATION:  Education details: PT Evaluation, findings, prognosis, frequency, attendance policy, and HEP. Person educated: Patient Education method: Medical illustrator Education  comprehension: verbalized understanding  HOME EXERCISE PROGRAM: Access Code: [email or web address] URL: [email or web address] Date: 05/16/2023 Prepared by: Irene Mannheim  Exercises - Seated Ankle Dorsiflexion Stretch  - 1 x daily - 7 x weekly - 3 sets - 10 reps - Seated Ankle Pumps  - 1 x daily - 7 x weekly - 3 sets - 20 reps - Seated Ankle Circles  - 1 x daily - 7 x weekly - 3 sets - 26 reps - Sit to Stand with Counter Support  - 1 x daily - 7 x weekly - 3 sets - 10 reps  ASSESSMENT:  CLINICAL IMPRESSION: Patient continues to demonstrate decreased LLE strength, decreased gait quality and balance. Patient also continues to demonstrate decreased gait speed. Patient able to initiate progress dynamic balance and core activation exercises today with good performance with verbal cueing. Pt requires education for STM of incision and scar mobilization, thought to be contributing to the decreased AROM of the left ankle. Pt also requires assist to don compression sleeve due to tension and bunching up of materail, pt states her son can help when donning at home. Pt educated on importance of compression sleeve throughout the day to relieve swelling of LLE. Patient would continue to benefit from skilled physical therapy for increased endurance with ambulation, increased LE strength, and improved balance for improved quality of life, improved independence with gait training and continued progress towards therapy goals.    Patient is a 73 y.o. female who was seen today for physical therapy evaluation and treatment for S82.202D,S82.402D (ICD-10-CM) - Closed fracture of left tibia and fibula with routine healing, subsequent encounter. Pt known to this clinic from previous PT POC for balance in setting of Parkinson's. Pt with recent ORIF in L ankle, is WBAT in boot and can wean off after 4/28. Pt with noticeable functional mobility deficits and limitations in ADLs and functional mobility due ot muscle  weakness, ROM deficits, pain and outcome measures above. Pt will benefit from skilled Physical Therapy services to address deficits/limitations in order to improve functional and QOL.    OBJECTIVE IMPAIRMENTS: Abnormal gait, decreased activity tolerance, decreased balance, decreased mobility, difficulty walking, decreased ROM, decreased strength, hypomobility, postural dysfunction, and pain.   ACTIVITY LIMITATIONS: carrying, lifting, bending, standing, squatting, stairs, transfers, bed mobility, and locomotion level  PARTICIPATION LIMITATIONS: meal prep, driving, community activity, occupation, and yard work  PERSONAL FACTORS: Age and 1-2 comorbidities: Parkinson's are also affecting patient's functional outcome.   REHAB POTENTIAL: Good  CLINICAL DECISION MAKING: Stable/uncomplicated  EVALUATION COMPLEXITY: Low   GOALS: Goals reviewed with patient? Yes  SHORT TERM GOALS: Target date: 07/11/23  Pt will be independent with HEP in order to demonstrate participation in Physical Therapy POC.  Baseline:  06/24/23:  Reports compliance with HEP. Goal status: MET  2.  Pt will 3/10 pain during mobility in order to demonstrate improved pain while performing ADLs.  Baseline: 06/24/23:  Pain scale average 2-8/10 Goal status: INITIAL  LONG TERM GOALS: Target date: 07/25/23  Pt will improve 30 Second Chair Stand Test by at least 2 reps in order to demonstrate improved functional strength to return to desired activities.  Baseline: see objective.; 06/24/23:  able to complete 8 times no HHA, was 5 initial eval. Goal status: MET  2.  Pt will improve TUG by at least 15 seconds in order to demonstrate improved functional ambulatory capacity in community setting.  Baseline: see objective. 07/14/23:  Improved to 24.07" was 47.96 seconds Goal status: MET  3.  Pt will improve LEFS score by 20% in order to demonstrate improved pain with functional goals and outcomes. Baseline: see objective. ; 06/24/23: 29/80=   36.3% was LEFS 21/80 = 26.3% Goal status: IN PROGRESS  4.  Pt will improve LLE ankle ROM by  at least 5 degrees in order to improve symmetry during functional activities.. Baseline: see objective.  Goal status: IN PROGRESS  5.  Pt will improve BLE MMT at least 1/2 grade  in order to improve endurance, balance and tolerance during functional activities.. Baseline: see objective.  Goal status: IN PROGRESS   PLAN:  PT FREQUENCY: 2x/week  PT DURATION: 4 weeks  PLANNED INTERVENTIONS: 97164- PT Re-evaluation, 97750- Physical Performance Testing, 97110-Therapeutic exercises, 97530- Therapeutic activity, 97112- Neuromuscular re-education, [phone]- Self Care, 19147- Manual therapy, [phone]- Gait training, [phone]- Electrical stimulation (unattended), [phone]- Electrical stimulation (manual), Patient/Family education, Balance training, Stair training, Joint mobilization, Joint manipulation, Spinal manipulation, Spinal mobilization, DME instructions, Cryotherapy, and Moist heat  PLAN FOR NEXT SESSION: Left ankle ROM, functional strengthening, inc WB activities out of boot to pt tolerance, encourage donning of compression sleeve, ask pt to schedule out for next month cert ends 06/29/60   Armond Bertin, PT, DPT [facility] Office: [phone] 2:11 PM, 06/26/23

## 2023-06-27 ENCOUNTER — Encounter (INDEPENDENT_AMBULATORY_CARE_PROVIDER_SITE_OTHER): Payer: Self-pay | Admitting: *Deleted

## 2023-07-01 ENCOUNTER — Ambulatory Visit (HOSPITAL_COMMUNITY): Admitting: Physical Therapy

## 2023-07-01 DIAGNOSIS — M6281 Muscle weakness (generalized): Secondary | ICD-10-CM

## 2023-07-01 DIAGNOSIS — S82202D Unspecified fracture of shaft of left tibia, subsequent encounter for closed fracture with routine healing: Secondary | ICD-10-CM | POA: Diagnosis not present

## 2023-07-01 DIAGNOSIS — Z7409 Other reduced mobility: Secondary | ICD-10-CM

## 2023-07-01 DIAGNOSIS — R2681 Unsteadiness on feet: Secondary | ICD-10-CM | POA: Diagnosis not present

## 2023-07-01 DIAGNOSIS — S82402D Unspecified fracture of shaft of left fibula, subsequent encounter for closed fracture with routine healing: Secondary | ICD-10-CM | POA: Diagnosis not present

## 2023-07-01 NOTE — Therapy (Signed)
 OUTPATIENT PHYSICAL THERAPY LOWER EXTREMITY TREATMENT  Patient Name: Kimberly Mccormick MRN: [id number] DOB:Feb 06, 1950, 73 y.o., female Today's Date: 07/01/2023  END OF SESSION:  PT End of Session - 07/01/23 1435     Visit Number 12    Number of Visits 18    Date for PT Re-Evaluation 07/25/23    Authorization Type [employer] Dual Complete    Authorization Time Period no auth; no limit    Progress Note Due on Visit 18    PT Start Time 1350    PT Stop Time 1430    PT Time Calculation (min) 40 min    Equipment Utilized During Treatment Gait belt    Activity Tolerance Patient tolerated treatment well    Behavior During Therapy WFL for tasks assessed/performed                 Past Medical History:  Diagnosis Date   Aneurysm of left internal carotid artery    Small supraclinoid 5 mm   Breast cancer (HCC)    Left s/p lumpectomy (05/06/2018) and adjuvant chemotherapy (06/23/2018) with Adriamycin and Cytoxan  x4 followed by Taxol weekly x12   DDD (degenerative disc disease), lumbar    Essential hypertension    Genital warts    GERD (gastroesophageal reflux disease)    History of anemia    History of renal insufficiency    Hypertension    Insomnia    Iron deficiency anemia    Ischemic stroke [facility])    December 2022   Mixed hyperlipidemia    Osteoarthritis    Parkinson's disease (HCC)    Peripheral neuropathy    Related to chemotherapy   Personal history of chemotherapy 2020   Past Surgical History:  Procedure Laterality Date   APPENDECTOMY  1966   BIOPSY  08/29/2020   Procedure: BIOPSY;  Surgeon: Urban Garden, MD;  [facility];  Service: Gastroenterology;;   BIOPSY  07/24/2022   Procedure: BIOPSY;  Surgeon: Urban Garden, MD;  [facility]: [facility];  Service: Gastroenterology;;   BREAST LUMPECTOMY WITH RADIOACTIVE SEED AND SENTINEL LYMPH NODE BIOPSY Left 05/06/2018   Procedure: LEFT BREAST LUMPECTOMY WITH RADIOACTIVE SEED AND LEFT DEEP AXILLARY  SENTINEL LYMPH NODE BIOPSY AND BLUE DYE INJECTION;  Surgeon: Boyce Byes, MD;  Location: [facility];  Service: General;  Laterality: Left;   CATARACT EXTRACTION W/PHACO Left 01/10/2014   Procedure: CATARACT EXTRACTION PHACO AND INTRAOCULAR LENS PLACEMENT ; CDE:  4.94;  Surgeon: Clay Cummins, MD;  Location: [facility];  Service: Ophthalmology;  Laterality: Left;   CESAREAN SECTION  1986   CHOLECYSTECTOMY     COLONOSCOPY WITH PROPOFOL  N/A 07/25/2020   Procedure: COLONOSCOPY WITH PROPOFOL ;  Surgeon: Urban Garden, MD;  [facility]: [facility];  Service: Gastroenterology;  Laterality: N/A;  10:55   ESOPHAGOGASTRODUODENOSCOPY (EGD) WITH PROPOFOL  N/A 08/29/2020   Procedure: ESOPHAGOGASTRODUODENOSCOPY (EGD) WITH PROPOFOL ;  Surgeon: Urban Garden, MD;  [facility];  Service: Gastroenterology;  Laterality: N/A;  12:00   ESOPHAGOGASTRODUODENOSCOPY (EGD) WITH PROPOFOL  N/A 07/24/2022   Procedure: ESOPHAGOGASTRODUODENOSCOPY (EGD) WITH PROPOFOL ;  Surgeon: Urban Garden, MD;  [facility];  Service: Gastroenterology;  Laterality: N/A;  12:45;ASA 1-2   HOT HEMOSTASIS  07/24/2022   Procedure: HOT HEMOSTASIS (ARGON PLASMA COAGULATION/BICAP);  Surgeon: Umberto Ganong, Bearl Limes, MD;  [facility]: [facility];  Service: Gastroenterology;;   IR 3D INDEPENDENT WKST  04/25/2021   IR ANGIO INTRA EXTRACRAN SEL COM CAROTID INNOMINATE UNI R  MOD SED  04/25/2021   IR ANGIO INTRA EXTRACRAN SEL INTERNAL CAROTID UNI L MOD SED  04/25/2021   IR ANGIOGRAM FOLLOW UP STUDY  04/25/2021   IR CT HEAD LTD  04/25/2021   IR NEURO EACH ADD'L AFTER BASIC UNI LEFT (MS)  04/25/2021   IR RADIOLOGIST EVAL & MGMT  03/22/2021   IR RADIOLOGIST EVAL & MGMT  03/28/2021   IR RADIOLOGIST EVAL & MGMT  05/15/2021   IR RADIOLOGIST EVAL & MGMT  09/28/2021   IR TRANSCATH/EMBOLIZ  04/25/2021   IR US  GUIDE VASC ACCESS RIGHT  04/25/2021   MYRINGOTOMY WITH TUBE PLACEMENT Right 02/04/2017   Procedure: REVISION  OF RIGHT MYRINGOTOMY WITH TUBE PLACEMENT, WITH EXAM OF LEFT EAR;  Surgeon: Reynold Caves, MD;  Location: [facility];  Service: ENT;  Laterality: Right;   NASAL SINUS SURGERY  2016   polypectomy   OPEN REDUCTION INTERNAL FIXATION (ORIF) TIBIA/FIBULA FRACTURE Left 02/02/2023   Procedure: OPEN REDUCTION INTERNAL FIXATION (ORIF) LEFT TIBIA/FIBULA FRACTURE;  Surgeon: Diedra Fowler, MD;  Location: [facility];  Service: Orthopedics;  Laterality: Left;   POLYPECTOMY  07/25/2020   Procedure: POLYPECTOMY;  Surgeon: Urban Garden, MD;  [facility];  Service: Gastroenterology;;   POLYPECTOMY  08/29/2020   Procedure: POLYPECTOMY;  Surgeon: Urban Garden, MD;  [facility];  Service: Gastroenterology;;   POLYPECTOMY  07/24/2022   Procedure: POLYPECTOMY;  Surgeon: Urban Garden, MD;  [facility];  Service: Gastroenterology;;   [facility] REMOVAL N/A 01/06/2019   Procedure: REMOVAL PORT-A-CATH;  Surgeon: Boyce Byes, MD;  Location: [facility];  Service: General;  Laterality: N/A;   PORTACATH PLACEMENT Right 06/16/2018   Procedure: INSERTION PORT-A-CATH;  Surgeon: Boyce Byes, MD;  Location: [facility];  Service: General;  Laterality: Right;   RADIOLOGY WITH ANESTHESIA N/A 04/25/2021   Procedure: IR WITH ANESTHESIA EMBOLIZATION;  Surgeon: Luellen Sages, MD;  Location: [facility];  Service: Radiology;  Laterality: N/A;   SCLEROTHERAPY  07/24/2022   Procedure: SCLEROTHERAPY;  Surgeon: Umberto Ganong, Bearl Limes, MD;  [facility]: [facility];  Service: Gastroenterology;;   SUBMUCOSAL LIFTING INJECTION  08/29/2020   Procedure: SUBMUCOSAL LIFTING INJECTION;  Surgeon: Umberto Ganong, Bearl Limes, MD;  [facility];  Service: Gastroenterology;;   SUBMUCOSAL LIFTING INJECTION  07/24/2022   Procedure: SUBMUCOSAL LIFTING INJECTION;  Surgeon: Urban Garden, MD;  [facility];  Service:  Gastroenterology;;   TONSILLECTOMY  1970   Patient Active Problem List   Diagnosis Date Noted   Closed fracture of left fibula and tibia 02/02/2023   Acute diarrhea 12/23/2022   Functional dyspepsia 07/01/2022   DOE (dyspnea on exertion) 01/15/2022   Nausea with vomiting 05/28/2021   Duodenal adenoma 05/28/2021   Brain aneurysm 04/25/2021   History of herpes simplex infection 01/08/2021   Papanicolaou smear, as part of routine gynecological examination 01/08/2021   CVA (cerebral vascular accident) (HCC) 01/04/2021   Acute ischemic stroke (HCC) 01/03/2021   Hypokalemia 01/03/2021   Cerebral aneurysm 01/03/2021   Musculoskeletal pain 01/03/2021   Early satiety 11/27/2020   Chronic abdominal pain 07/13/2020   Port-A-Cath in place 06/23/2018   Malignant neoplasm of upper-inner quadrant of left breast in female, estrogen receptor positive (HCC) 04/16/2018   Essential hypertension 03/21/2015   Hyperlipidemia 03/21/2015   Genital herpes 03/21/2015   Fecal urgency 03/21/2015   GERD (gastroesophageal reflux disease) 03/21/2015    PCP: Alston Jerry, MD  REFERRING PROVIDER:  Diedra Fowler, MD  REFERRING DIAG: S82.202D,S82.402D (ICD-10-CM) - Closed fracture of left tibia and fibula with routine healing, subsequent encounter   THERAPY DIAG:  No diagnosis found.  Rationale for Evaluation and Treatment: Rehabilitation  ONSET DATE: 02/02/2023  SUBJECTIVE:   SUBJECTIVE STATEMENT: Pt states she has not had any pain since her last visit after Derwood Flor massaged it.  States she drove here today for the first time also. Wearing compression sock on Rt LE.  Continues to ambulate with [facility]  Evaluation:   had fall on the ice in Jan of 2025 and had ORIF in L tibia fibula. Had Home Health PT since then. With activity pt's pain gets 5/10.   PERTINENT HISTORY: Closed Fx of Left tibia and fibula Parkinson's Disease without Dyskinesia, without mentation of fluctioantios Cerebral  Aneurysm PAIN:  Are you having pain? No  PRECAUTIONS: Left CAM Boot-can transition to regular after a month in the boot  RED FLAGS: None   WEIGHT BEARING RESTRICTIONS: WBAT on LLE  FALLS:  Has patient fallen in last 6 months? Fall is MOI   PATIENT GOALS: "To be able to walk on my own with AD"  NEXT MD VISIT: in 3 months  OBJECTIVE:  Note: Objective measures were completed at Evaluation unless otherwise noted.  DIAGNOSTIC FINDINGS:   PATIENT SURVEYS:  LEFS 21/80 = 26.3%  06/24/23: 29/80=  36.3% COGNITION: Overall cognitive status: Within functional limits for tasks assessed     SENSATION: WFL  POSTURE: rounded shoulders and forward head  LOWER EXTREMITY ROM:  Active ROM Right eval Left eval Left 06/24/23:  Hip flexion     Hip extension     Hip abduction     Hip adduction     Hip internal rotation     Hip external rotation     Knee flexion     Knee extension     Ankle dorsiflexion 15 A5  P8 A8 I P12  Ankle plantarflexion   A 8   Ankle inversion WFL A2  P15 20  Ankle eversion WFL A10 20 A12 I P 20   (Blank rows = not tested)  LOWER EXTREMITY MMT:  MMT Right eval Left eval Right  06/24/23 Left 06/24/23  Hip flexion      Hip extension      Hip abduction      Hip adduction      Hip internal rotation      Hip external rotation      Knee flexion      Knee extension 3+ 4+ 4+ 4  Ankle dorsiflexion 2 4 4  3-  Ankle plantarflexion 2+ 4 4 3   Ankle inversion 2+ 4 4 3   Ankle eversion 2+ 4 4 3-   (Blank rows = not tested)   FUNCTIONAL TESTS:  30 Second Chair Stand Test: 5x  Norms:   Age 60-64 9-69 70-74 75-79 80-84 85-89 90-94  Women 15 15 14 13 12 11 9   Men 17 16 15 14 13 11 9    TUG: 47.96 seconds   06/24/23: 144ft with SPC in shoe 30 Second Chair Stand Test: 8x no HHA TUG: 24.07" GAIT: Distance walked: 88ft Assistive device utilized: Walker - 4 wheeled Level of assistance: SBA Comments: stepto pattern with RLE leading and then LLE  TREATMENT DATE:  07/01/23 Standing: in // bars, regular shoe, bil UE assist heelraises 20X Toeraises 20X  Slant board stretch 3X30"  BAPS (tried 2,1 and unable to isolate movements)  Rockerboard lateral and A/P 20X each SLS 10" each side with intermittent fingertip touch  Seated:  BAPS level 3 20X A/P, Rt/L  BAPS level 3 CW/CCW 10X each (diff with eversion)   06/26/2023  Manual Therapy: -PROM  of L ankle, plantar flexion, dorsiflexion, inversion and eversion -STM of Left ankle, surgical site, edema techniques Therapeutic Exercise: -Supine Ankle AROM each direction 20 reps following manual therapy -Heel/toe raises, 2 sets of 10 reps, on incline and BUE support -Stair ambulation, 2 bouts 4 stairs, BUE support needed, reciprocal pattern -BAPS board standing with BUE support, therapist hand assist with board, circles clockwise/counter clockwise and DF/PF taps and EV/IN taps, 10 each direction  06/24/23:   06/24/23: 145ft with SPC in shoe 30 Second Chair Stand Test: 8x no HHA TUG: 24.07" LEFS 06/24/23: 29/80=  36.3% ROM MMT  Heel raises 2x 10 Toe raises 2x10  Manual decongestive massage for edema control with LE elevated, PROM Pt able to tie shoe at EOS.    06/20/23:  Change boot to shoes (increased time, requested pt to arrive in shoes for therapy) Gait training total 210ft with SPC wearing shoes - 52ft with SPC, cueing for sequence and increase Rt LE stride length - Sit to stands with trampoline toss yellow ball-10x Standing with UE support in //bars: - Heel/toe raises 2 sets of 10 reps - Tandem stance 3x 30" on foam, intermittent HHA - Obstacle clearance in // bars: LLE on foam mat, RLE stepping over foam block and back. RUE use only, 10x, no reports of increased pain - [place] x 5' seat 7 UE/LE     PATIENT EDUCATION:   Education details: PT Evaluation, findings, prognosis, frequency, attendance policy, and HEP. Person educated: Patient Education method: Medical illustrator Education comprehension: verbalized understanding  HOME EXERCISE PROGRAM: Access Code: [email or web address] URL: [email or web address] Date: 05/16/2023 Prepared by: Irene Mannheim  Exercises - Seated Ankle Dorsiflexion Stretch  - 1 x daily - 7 x weekly - 3 sets - 10 reps - Seated Ankle Pumps  - 1 x daily - 7 x weekly - 3 sets - 20 reps - Seated Ankle Circles  - 1 x daily - 7 x weekly - 3 sets - 26 reps - Sit to Stand with Counter Support  - 1 x daily - 7 x weekly - 3 sets - 10 reps  ASSESSMENT:  CLINICAL IMPRESSION: Continued with focus on improving mobility of ankle and overall LE stability.  Continues to mobilize very slowly.  Attempted BAPS in standing, even on lowest level pt was unable to isolate movements required, using body to accomplish task.  Discontinued this and began with Rockerboard in standing; continued BAPS in seated only. Pt with better lateral control than ant/post when completing rockerboard. Most challenging moving ankle into inversion as demonstrated with seated BAPS motions making CW/CCW extremely difficult.  Patient will continue to benefit from skilled physical therapy to progress towards goals.   Patient is a 73 y.o. female who was seen today for physical therapy evaluation and treatment for S82.202D,S82.402D (ICD-10-CM) - Closed fracture of left tibia and fibula with routine healing, subsequent encounter. Pt known to this clinic from previous PT POC for balance in setting of Parkinson's. Pt with recent ORIF in L ankle, is WBAT in boot and can wean  off after 4/28. Pt with noticeable functional mobility deficits and limitations in ADLs and functional mobility due ot muscle weakness, ROM deficits, pain and outcome measures above. Pt will benefit from skilled Physical Therapy services to address  deficits/limitations in order to improve functional and QOL.    OBJECTIVE IMPAIRMENTS: Abnormal gait, decreased activity tolerance, decreased balance, decreased mobility, difficulty walking, decreased ROM, decreased strength, hypomobility, postural dysfunction, and pain.   ACTIVITY LIMITATIONS: carrying, lifting, bending, standing, squatting, stairs, transfers, bed mobility, and locomotion level  PARTICIPATION LIMITATIONS: meal prep, driving, community activity, occupation, and yard work  PERSONAL FACTORS: Age and 1-2 comorbidities: Parkinson's are also affecting patient's functional outcome.   REHAB POTENTIAL: Good  CLINICAL DECISION MAKING: Stable/uncomplicated  EVALUATION COMPLEXITY: Low   GOALS: Goals reviewed with patient? Yes  SHORT TERM GOALS: Target date: 07/11/23  Pt will be independent with HEP in order to demonstrate participation in Physical Therapy POC.  Baseline:  06/24/23:  Reports compliance with HEP. Goal status: MET  2.  Pt will 3/10 pain during mobility in order to demonstrate improved pain while performing ADLs.  Baseline: 06/24/23:  Pain scale average 2-8/10 Goal status: INITIAL  LONG TERM GOALS: Target date: 07/25/23  Pt will improve 30 Second Chair Stand Test by at least 2 reps in order to demonstrate improved functional strength to return to desired activities.  Baseline: see objective.; 06/24/23:  able to complete 8 times no HHA, was 5 initial eval. Goal status: MET  2.  Pt will improve TUG by at least 15 seconds in order to demonstrate improved functional ambulatory capacity in community setting.  Baseline: see objective. 07/14/23:  Improved to 24.07" was 47.96 seconds Goal status: MET  3.  Pt will improve LEFS score by 20% in order to demonstrate improved pain with functional goals and outcomes. Baseline: see objective. ; 06/24/23: 29/80=  36.3% was LEFS 21/80 = 26.3% Goal status: IN PROGRESS  4.  Pt will improve LLE ankle ROM by  at least 5 degrees in order  to improve symmetry during functional activities.. Baseline: see objective.  Goal status: IN PROGRESS  5.  Pt will improve BLE MMT at least 1/2 grade  in order to improve endurance, balance and tolerance during functional activities.. Baseline: see objective.  Goal status: IN PROGRESS   PLAN:  PT FREQUENCY: 2x/week  PT DURATION: 4 weeks  PLANNED INTERVENTIONS: [id number]- PT Re-evaluation, 97750- Physical Performance Testing, 97110-Therapeutic exercises, 97530- Therapeutic activity, [id number]- Neuromuscular re-education, 97535- Self Care, [id number]- Manual therapy, [phone]- Gait training, [phone]- Electrical stimulation (unattended), [phone]- Electrical stimulation (manual), Patient/Family education, Balance training, Stair training, Joint mobilization, Joint manipulation, Spinal manipulation, Spinal mobilization, DME instructions, Cryotherapy, and Moist heat  PLAN FOR NEXT SESSION: continue to improve functional ROM and strength of Lt ankle and gait with LRAD.  Lorenso Romance, PTA/CLT [facility] Ph: [phone] 2:36 PM, 07/01/23

## 2023-07-04 ENCOUNTER — Ambulatory Visit (HOSPITAL_COMMUNITY)

## 2023-07-04 ENCOUNTER — Encounter (HOSPITAL_COMMUNITY): Payer: Self-pay

## 2023-07-04 DIAGNOSIS — M6281 Muscle weakness (generalized): Secondary | ICD-10-CM

## 2023-07-04 DIAGNOSIS — S82202D Unspecified fracture of shaft of left tibia, subsequent encounter for closed fracture with routine healing: Secondary | ICD-10-CM

## 2023-07-04 DIAGNOSIS — Z7409 Other reduced mobility: Secondary | ICD-10-CM | POA: Diagnosis not present

## 2023-07-04 DIAGNOSIS — S82402D Unspecified fracture of shaft of left fibula, subsequent encounter for closed fracture with routine healing: Secondary | ICD-10-CM | POA: Diagnosis not present

## 2023-07-04 DIAGNOSIS — R2681 Unsteadiness on feet: Secondary | ICD-10-CM | POA: Diagnosis not present

## 2023-07-04 NOTE — Therapy (Signed)
 OUTPATIENT PHYSICAL THERAPY LOWER EXTREMITY TREATMENT  Patient Name: Kimberly Mccormick MRN: [id number] DOB:April 23, 1950, 73 y.o., female Today's Date: 07/04/2023  END OF SESSION:  PT End of Session - 07/04/23 1559     Visit Number 13    Number of Visits 18    Date for PT Re-Evaluation 07/25/23    Authorization Type [employer] Dual Complete    Authorization Time Period no auth; no limit    Progress Note Due on Visit 18    PT Start Time 1559    PT Stop Time 1640    PT Time Calculation (min) 41 min    Equipment Utilized During Treatment Gait belt    Activity Tolerance Patient tolerated treatment well    Behavior During Therapy WFL for tasks assessed/performed               Past Medical History:  Diagnosis Date   Aneurysm of left internal carotid artery    Small supraclinoid 5 mm   Breast cancer (HCC)    Left s/p lumpectomy (05/06/2018) and adjuvant chemotherapy (06/23/2018) with Adriamycin and Cytoxan  x4 followed by Taxol weekly x12   DDD (degenerative disc disease), lumbar    Essential hypertension    Genital warts    GERD (gastroesophageal reflux disease)    History of anemia    History of renal insufficiency    Hypertension    Insomnia    Iron deficiency anemia    Ischemic stroke [facility])    December 2022   Mixed hyperlipidemia    Osteoarthritis    Parkinson's disease (HCC)    Peripheral neuropathy    Related to chemotherapy   Personal history of chemotherapy 2020   Past Surgical History:  Procedure Laterality Date   APPENDECTOMY  1966   BIOPSY  08/29/2020   Procedure: BIOPSY;  Surgeon: Urban Garden, MD;  [facility]: [facility];  Service: Gastroenterology;;   BIOPSY  07/24/2022   Procedure: BIOPSY;  Surgeon: Urban Garden, MD;  [facility];  Service: Gastroenterology;;   BREAST LUMPECTOMY WITH RADIOACTIVE SEED AND SENTINEL LYMPH NODE BIOPSY Left 05/06/2018   Procedure: LEFT BREAST LUMPECTOMY WITH RADIOACTIVE SEED AND LEFT DEEP AXILLARY  SENTINEL LYMPH NODE BIOPSY AND BLUE DYE INJECTION;  Surgeon: Boyce Byes, MD;  Location: [facility];  Service: General;  Laterality: Left;   CATARACT EXTRACTION W/PHACO Left 01/10/2014   Procedure: CATARACT EXTRACTION PHACO AND INTRAOCULAR LENS PLACEMENT ; CDE:  4.94;  Surgeon: Clay Cummins, MD;  Location: [facility];  Service: Ophthalmology;  Laterality: Left;   CESAREAN SECTION  1986   CHOLECYSTECTOMY     COLONOSCOPY WITH PROPOFOL  N/A 07/25/2020   Procedure: COLONOSCOPY WITH PROPOFOL ;  Surgeon: Urban Garden, MD;  [facility];  Service: Gastroenterology;  Laterality: N/A;  10:55   ESOPHAGOGASTRODUODENOSCOPY (EGD) WITH PROPOFOL  N/A 08/29/2020   Procedure: ESOPHAGOGASTRODUODENOSCOPY (EGD) WITH PROPOFOL ;  Surgeon: Urban Garden, MD;  [facility];  Service: Gastroenterology;  Laterality: N/A;  12:00   ESOPHAGOGASTRODUODENOSCOPY (EGD) WITH PROPOFOL  N/A 07/24/2022   Procedure: ESOPHAGOGASTRODUODENOSCOPY (EGD) WITH PROPOFOL ;  Surgeon: Urban Garden, MD;  [facility];  Service: Gastroenterology;  Laterality: N/A;  12:45;ASA 1-2   HOT HEMOSTASIS  07/24/2022   Procedure: HOT HEMOSTASIS (ARGON PLASMA COAGULATION/BICAP);  Surgeon: Umberto Ganong, Bearl Limes, MD;  [facility];  Service: Gastroenterology;;   IR 3D INDEPENDENT WKST  04/25/2021   IR ANGIO INTRA EXTRACRAN SEL COM CAROTID INNOMINATE UNI R MOD SED  04/25/2021   IR ANGIO INTRA EXTRACRAN SEL INTERNAL CAROTID UNI L MOD SED  04/25/2021   IR ANGIOGRAM FOLLOW UP STUDY  04/25/2021   IR CT HEAD LTD  04/25/2021   IR NEURO EACH ADD'L AFTER BASIC UNI LEFT (MS)  04/25/2021   IR RADIOLOGIST EVAL & MGMT  03/22/2021   IR RADIOLOGIST EVAL & MGMT  03/28/2021   IR RADIOLOGIST EVAL & MGMT  05/15/2021   IR RADIOLOGIST EVAL & MGMT  09/28/2021   IR TRANSCATH/EMBOLIZ  04/25/2021   IR US  GUIDE VASC ACCESS RIGHT  04/25/2021   MYRINGOTOMY WITH TUBE PLACEMENT Right 02/04/2017   Procedure: REVISION  OF RIGHT MYRINGOTOMY WITH TUBE PLACEMENT, WITH EXAM OF LEFT EAR;  Surgeon: Reynold Caves, MD;  Location: [facility];  Service: ENT;  Laterality: Right;   NASAL SINUS SURGERY  2016   polypectomy   OPEN REDUCTION INTERNAL FIXATION (ORIF) TIBIA/FIBULA FRACTURE Left 02/02/2023   Procedure: OPEN REDUCTION INTERNAL FIXATION (ORIF) LEFT TIBIA/FIBULA FRACTURE;  Surgeon: Diedra Fowler, MD;  Location: [facility];  Service: Orthopedics;  Laterality: Left;   POLYPECTOMY  07/25/2020   Procedure: POLYPECTOMY;  Surgeon: Urban Garden, MD;  [facility];  Service: Gastroenterology;;   POLYPECTOMY  08/29/2020   Procedure: POLYPECTOMY;  Surgeon: Urban Garden, MD;  [facility];  Service: Gastroenterology;;   POLYPECTOMY  07/24/2022   Procedure: POLYPECTOMY;  Surgeon: Urban Garden, MD;  [facility];  Service: Gastroenterology;;   [facility] REMOVAL N/A 01/06/2019   Procedure: REMOVAL PORT-A-CATH;  Surgeon: Boyce Byes, MD;  Location: [facility];  Service: General;  Laterality: N/A;   PORTACATH PLACEMENT Right 06/16/2018   Procedure: INSERTION PORT-A-CATH;  Surgeon: Boyce Byes, MD;  Location:  [facility];  Service: General;  Laterality: Right;   RADIOLOGY WITH ANESTHESIA N/A 04/25/2021   Procedure: IR WITH ANESTHESIA EMBOLIZATION;  Surgeon: Luellen Sages, MD;  Location: [facility];  Service: Radiology;  Laterality: N/A;   SCLEROTHERAPY  07/24/2022   Procedure: SCLEROTHERAPY;  Surgeon: Umberto Ganong, Bearl Limes, MD;  [facility];  Service: Gastroenterology;;   SUBMUCOSAL LIFTING INJECTION  08/29/2020   Procedure: SUBMUCOSAL LIFTING INJECTION;  Surgeon: Umberto Ganong, Bearl Limes, MD;  [facility];  Service: Gastroenterology;;   SUBMUCOSAL LIFTING INJECTION  07/24/2022   Procedure: SUBMUCOSAL LIFTING INJECTION;  Surgeon: Urban Garden, MD;  [facility];  Service:  Gastroenterology;;   TONSILLECTOMY  1970   Patient Active Problem List   Diagnosis Date Noted   Closed fracture of left fibula and tibia 02/02/2023   Acute diarrhea 12/23/2022   Functional dyspepsia 07/01/2022   DOE (dyspnea on exertion) 01/15/2022   Nausea with vomiting 05/28/2021   Duodenal adenoma 05/28/2021   Brain aneurysm 04/25/2021   History of herpes simplex infection 01/08/2021   Papanicolaou smear, as part of routine gynecological examination 01/08/2021   CVA (cerebral vascular accident) (HCC) 01/04/2021   Acute ischemic stroke (HCC) 01/03/2021   Hypokalemia 01/03/2021   Cerebral aneurysm 01/03/2021   Musculoskeletal pain 01/03/2021   Early satiety 11/27/2020   Chronic abdominal pain 07/13/2020   Port-A-Cath in place 06/23/2018   Malignant neoplasm of upper-inner quadrant of left breast in female, estrogen receptor positive (HCC) 04/16/2018   Essential hypertension 03/21/2015   Hyperlipidemia 03/21/2015   Genital herpes 03/21/2015   Fecal urgency 03/21/2015   GERD (gastroesophageal reflux disease) 03/21/2015    PCP: Alston Jerry, MD  REFERRING PROVIDER: Diedra Fowler,  MD  REFERRING DIAG: S82.202D,S82.402D (ICD-10-CM) - Closed fracture of left tibia and fibula with routine healing, subsequent encounter   THERAPY DIAG:  Muscle weakness (generalized)  Closed fracture of left tibia and fibula with routine healing, subsequent encounter  Impaired functional mobility, balance, gait, and endurance  Rationale for Evaluation and Treatment: Rehabilitation  ONSET DATE: 02/02/2023  SUBJECTIVE:   SUBJECTIVE STATEMENT: Pt states she did not drive today. Pt states she has 5/10 pain in left foot and states she is moving slower today. Pt states she had a funeral to go to and spent a long time on feet yesterday. Wearing compression sock on Rt LE.  Continues to ambulate with [facility]  Evaluation:   had fall on the ice in Jan of 2025 and had ORIF in L tibia fibula. Had Home  Health PT since then. With activity pt's pain gets 5/10.   PERTINENT HISTORY: Closed Fx of Left tibia and fibula Parkinson's Disease without Dyskinesia, without mentation of fluctioantios Cerebral Aneurysm PAIN:  Are you having pain? No  PRECAUTIONS: Left CAM Boot-can transition to regular after a month in the boot  RED FLAGS: None   WEIGHT BEARING RESTRICTIONS: WBAT on LLE  FALLS:  Has patient fallen in last 6 months? Fall is MOI   PATIENT GOALS: To be able to walk on my own with AD  NEXT MD VISIT: in 3 months  OBJECTIVE:  Note: Objective measures were completed at Evaluation unless otherwise noted.  DIAGNOSTIC FINDINGS:   PATIENT SURVEYS:  LEFS 21/80 = 26.3%  06/24/23: 29/80=  36.3% COGNITION: Overall cognitive status: Within functional limits for tasks assessed     SENSATION: WFL  POSTURE: rounded shoulders and forward head  LOWER EXTREMITY ROM:  Active ROM Right eval Left eval Left 06/24/23:  Hip flexion     Hip extension     Hip abduction     Hip adduction     Hip internal rotation     Hip external rotation     Knee flexion     Knee extension     Ankle dorsiflexion 15 A5  P8 A8 I P12  Ankle plantarflexion   A 8   Ankle inversion WFL A2  P15 20  Ankle eversion WFL A10 20 A12 I P 20   (Blank rows = not tested)  LOWER EXTREMITY MMT:  MMT Right eval Left eval Right  06/24/23 Left 06/24/23  Hip flexion      Hip extension      Hip abduction      Hip adduction      Hip internal rotation      Hip external rotation      Knee flexion      Knee extension 3+ 4+ 4+ 4  Ankle dorsiflexion 2 4 4  3-  Ankle plantarflexion 2+ 4 4 3   Ankle inversion 2+ 4 4 3   Ankle eversion 2+ 4 4 3-   (Blank rows = not tested)   FUNCTIONAL TESTS:  30 Second Chair Stand Test: 5x  Norms:   Age 72-64 40-69 70-74 75-79 80-84 85-89 90-94  Women 15 15 14 13 12 11 9   Men 17 16 15 14 13 11 9    TUG: 47.96 seconds   06/24/23: 140ft with SPC in shoe 30 Second  Chair Stand Test: 8x no HHA TUG: 24.07 GAIT: Distance walked: 64ft Assistive device utilized: Environmental consultant - 4 wheeled Level of assistance: SBA Comments: stepto pattern with RLE leading and then LLE  TREATMENT DATE:  07/04/2023   Therapeutic Exercise: -Nustep, 5 minutes, 60 spm, level 2 resistance, pt likes nustep -Supine Ankle AROM each direction 20 reps following manual therapy -Heel/toe raises, 2 sets of 10 reps, on incline and BUE support -TKE for AROM, 4 inch step, 2 sets of 10 reps, BUE support, pt cued for decreased WB through RLE with heel tap -Aeromat walks, tandem stepping and lateral stepping, 2 laps each variation, no UE support for about 5 steps at a time -Ankle drive stretch on 12 inch step in parallel bars, 2 sets of 7 reps, 5 seconds holds -STS to staggered stance on 6 inch step and trampoline toss, with red ball, 3 sets of 5 reps, 3 tosses per rep, pt cued for upright posture    07/01/23 Standing: in // bars, regular shoe, bil UE assist heelraises 20X Toeraises 20X  Slant board stretch 3X30  BAPS (tried 2,1 and unable to isolate movements)  Rockerboard lateral and A/P 20X each SLS 10 each side with intermittent fingertip touch  Seated:  BAPS level 3 20X A/P, Rt/L  BAPS level 3 CW/CCW 10X each (diff with eversion)   06/26/2023  Manual Therapy: -PROM  of L ankle, plantar flexion, dorsiflexion, inversion and eversion -STM of Left ankle, surgical site, edema techniques Therapeutic Exercise: -Supine Ankle AROM each direction 20 reps following manual therapy -Heel/toe raises, 2 sets of 10 reps, on incline and BUE support -Stair ambulation, 2 bouts 4 stairs, BUE support needed, reciprocal pattern -BAPS board standing with BUE support, therapist hand assist with board, circles clockwise/counter clockwise and DF/PF taps and EV/IN taps, 10 each  direction   PATIENT EDUCATION:  Education details: PT Evaluation, findings, prognosis, frequency, attendance policy, and HEP. Person educated: Patient Education method: Medical illustrator Education comprehension: verbalized understanding  HOME EXERCISE PROGRAM: Access Code: [email or web address] URL: [email or web address] Date: 05/16/2023 Prepared by: Irene Mannheim  Exercises - Seated Ankle Dorsiflexion Stretch  - 1 x daily - 7 x weekly - 3 sets - 10 reps - Seated Ankle Pumps  - 1 x daily - 7 x weekly - 3 sets - 20 reps - Seated Ankle Circles  - 1 x daily - 7 x weekly - 3 sets - 26 reps - Sit to Stand with Counter Support  - 1 x daily - 7 x weekly - 3 sets - 10 reps  ASSESSMENT:  CLINICAL IMPRESSION: Patient continues to demonstrate decreased LLE strength/ROM, decreased gait quality and balance. Patient also demonstrates decreased endurance with aerobic based exercise during today's session. Patient able to progress dynamic balance and core activation exercises today with ankle drive stretch and aeromat walks, good performance with verbal cueing. Patient would continue to benefit from skilled physical therapy for increased endurance with ambulation, increased LE strength, and improved balance for improved quality of life, improved independence with gait training, functional mobility and continued progress towards therapy goals.   Patient is a 73 y.o. female who was seen today for physical therapy evaluation and treatment for S82.202D,S82.402D (ICD-10-CM) - Closed fracture of left tibia and fibula with routine healing, subsequent encounter. Pt known to this clinic from previous PT POC for balance in setting of Parkinson's. Pt with recent ORIF in L ankle, is WBAT in boot and can wean off after 4/28. Pt with noticeable functional mobility deficits and limitations in ADLs and functional mobility due ot muscle weakness, ROM deficits, pain and outcome measures above. Pt will benefit  from skilled Physical Therapy services to address deficits/limitations in order to  improve functional and QOL.    OBJECTIVE IMPAIRMENTS: Abnormal gait, decreased activity tolerance, decreased balance, decreased mobility, difficulty walking, decreased ROM, decreased strength, hypomobility, postural dysfunction, and pain.   ACTIVITY LIMITATIONS: carrying, lifting, bending, standing, squatting, stairs, transfers, bed mobility, and locomotion level  PARTICIPATION LIMITATIONS: meal prep, driving, community activity, occupation, and yard work  PERSONAL FACTORS: Age and 1-2 comorbidities: Parkinson's are also affecting patient's functional outcome.   REHAB POTENTIAL: Good  CLINICAL DECISION MAKING: Stable/uncomplicated  EVALUATION COMPLEXITY: Low   GOALS: Goals reviewed with patient? Yes  SHORT TERM GOALS: Target date: 07/11/23  Pt will be independent with HEP in order to demonstrate participation in Physical Therapy POC.  Baseline:  06/24/23:  Reports compliance with HEP. Goal status: MET  2.  Pt will 3/10 pain during mobility in order to demonstrate improved pain while performing ADLs.  Baseline: 06/24/23:  Pain scale average 2-8/10 Goal status: INITIAL  LONG TERM GOALS: Target date: 07/25/23  Pt will improve 30 Second Chair Stand Test by at least 2 reps in order to demonstrate improved functional strength to return to desired activities.  Baseline: see objective.; 06/24/23:  able to complete 8 times no HHA, was 5 initial eval. Goal status: MET  2.  Pt will improve TUG by at least 15 seconds in order to demonstrate improved functional ambulatory capacity in community setting.  Baseline: see objective. 07/14/23:  Improved to 24.07 was 47.96 seconds Goal status: MET  3.  Pt will improve LEFS score by 20% in order to demonstrate improved pain with functional goals and outcomes. Baseline: see objective. ; 06/24/23: 29/80=  36.3% was LEFS 21/80 = 26.3% Goal status: IN PROGRESS  4.  Pt will  improve LLE ankle ROM by  at least 5 degrees in order to improve symmetry during functional activities.. Baseline: see objective.  Goal status: IN PROGRESS  5.  Pt will improve BLE MMT at least 1/2 grade  in order to improve endurance, balance and tolerance during functional activities.. Baseline: see objective.  Goal status: IN PROGRESS   PLAN:  PT FREQUENCY: 2x/week  PT DURATION: 4 weeks  PLANNED INTERVENTIONS: [id number]- PT Re-evaluation, 97750- Physical Performance Testing, 97110-Therapeutic exercises, 97530- Therapeutic activity, [id number]- Neuromuscular re-education, 97535- Self Care, [id number]- Manual therapy, [phone]- Gait training, [phone]- Electrical stimulation (unattended), [phone]- Electrical stimulation (manual), Patient/Family education, Balance training, Stair training, Joint mobilization, Joint manipulation, Spinal manipulation, Spinal mobilization, DME instructions, Cryotherapy, and Moist heat  PLAN FOR NEXT SESSION: continue to improve functional ROM and strength of Lt ankle and gait with LRAD.   Armond Bertin, PT, DPT [facility] Office: [phone] 4:48 PM, 07/04/23

## 2023-07-06 DIAGNOSIS — D132 Benign neoplasm of duodenum: Secondary | ICD-10-CM | POA: Diagnosis not present

## 2023-07-06 DIAGNOSIS — S82202A Unspecified fracture of shaft of left tibia, initial encounter for closed fracture: Secondary | ICD-10-CM | POA: Diagnosis not present

## 2023-07-06 DIAGNOSIS — K30 Functional dyspepsia: Secondary | ICD-10-CM | POA: Diagnosis not present

## 2023-07-07 DIAGNOSIS — Z9622 Myringotomy tube(s) status: Secondary | ICD-10-CM | POA: Diagnosis not present

## 2023-07-07 DIAGNOSIS — H6123 Impacted cerumen, bilateral: Secondary | ICD-10-CM | POA: Diagnosis not present

## 2023-07-07 DIAGNOSIS — H7291 Unspecified perforation of tympanic membrane, right ear: Secondary | ICD-10-CM | POA: Diagnosis not present

## 2023-07-07 DIAGNOSIS — Z974 Presence of external hearing-aid: Secondary | ICD-10-CM | POA: Diagnosis not present

## 2023-07-10 ENCOUNTER — Ambulatory Visit (HOSPITAL_COMMUNITY): Admitting: Physical Therapy

## 2023-07-10 DIAGNOSIS — S82402D Unspecified fracture of shaft of left fibula, subsequent encounter for closed fracture with routine healing: Secondary | ICD-10-CM | POA: Diagnosis not present

## 2023-07-10 DIAGNOSIS — R2681 Unsteadiness on feet: Secondary | ICD-10-CM

## 2023-07-10 DIAGNOSIS — S82202D Unspecified fracture of shaft of left tibia, subsequent encounter for closed fracture with routine healing: Secondary | ICD-10-CM | POA: Diagnosis not present

## 2023-07-10 DIAGNOSIS — M6281 Muscle weakness (generalized): Secondary | ICD-10-CM | POA: Diagnosis not present

## 2023-07-10 DIAGNOSIS — Z7409 Other reduced mobility: Secondary | ICD-10-CM | POA: Diagnosis not present

## 2023-07-10 NOTE — Therapy (Signed)
 OUTPATIENT PHYSICAL THERAPY LOWER EXTREMITY TREATMENT  Patient Name: Kimberly Mccormick MRN: [id number] DOB:January 08, 1951, 73 y.o., female Today's Date: 07/10/2023  END OF SESSION:  PT End of Session - 07/10/23 1604     Visit Number 14    Number of Visits 18    Date for PT Re-Evaluation 07/25/23    Authorization Type [employer] Dual Complete    Authorization Time Period no auth; no limit    Progress Note Due on Visit 18    PT Start Time 1510    PT Stop Time 1556    PT Time Calculation (min) 46 min    Equipment Utilized During Treatment Gait belt    Activity Tolerance Patient tolerated treatment well    Behavior During Therapy WFL for tasks assessed/performed                Past Medical History:  Diagnosis Date   Aneurysm of left internal carotid artery    Small supraclinoid 5 mm   Breast cancer (HCC)    Left s/p lumpectomy (05/06/2018) and adjuvant chemotherapy (06/23/2018) with Adriamycin and Cytoxan  x4 followed by Taxol weekly x12   DDD (degenerative disc disease), lumbar    Essential hypertension    Genital warts    GERD (gastroesophageal reflux disease)    History of anemia    History of renal insufficiency    Hypertension    Insomnia    Iron deficiency anemia    Ischemic stroke [facility])    December 2022   Mixed hyperlipidemia    Osteoarthritis    Parkinson's disease (HCC)    Peripheral neuropathy    Related to chemotherapy   Personal history of chemotherapy 2020   Past Surgical History:  Procedure Laterality Date   APPENDECTOMY  1966   BIOPSY  08/29/2020   Procedure: BIOPSY;  Surgeon: Urban Garden, MD;  [facility]: [facility];  Service: Gastroenterology;;   BIOPSY  07/24/2022   Procedure: BIOPSY;  Surgeon: Urban Garden, MD;  [facility]: [facility];  Service: Gastroenterology;;   BREAST LUMPECTOMY WITH RADIOACTIVE SEED AND SENTINEL LYMPH NODE BIOPSY Left 05/06/2018   Procedure: LEFT BREAST LUMPECTOMY WITH RADIOACTIVE SEED AND LEFT DEEP AXILLARY  SENTINEL LYMPH NODE BIOPSY AND BLUE DYE INJECTION;  Surgeon: Boyce Byes, MD;  Location: [facility];  Service: General;  Laterality: Left;   CATARACT EXTRACTION W/PHACO Left 01/10/2014   Procedure: CATARACT EXTRACTION PHACO AND INTRAOCULAR LENS PLACEMENT ; CDE:  4.94;  Surgeon: Clay Cummins, MD;  Location: [facility];  Service: Ophthalmology;  Laterality: Left;   CESAREAN SECTION  1986   CHOLECYSTECTOMY     COLONOSCOPY WITH PROPOFOL  N/A 07/25/2020   Procedure: COLONOSCOPY WITH PROPOFOL ;  Surgeon: Urban Garden, MD;  [facility];  Service: Gastroenterology;  Laterality: N/A;  10:55   ESOPHAGOGASTRODUODENOSCOPY (EGD) WITH PROPOFOL  N/A 08/29/2020   Procedure: ESOPHAGOGASTRODUODENOSCOPY (EGD) WITH PROPOFOL ;  Surgeon: Urban Garden, MD;  [facility];  Service: Gastroenterology;  Laterality: N/A;  12:00   ESOPHAGOGASTRODUODENOSCOPY (EGD) WITH PROPOFOL  N/A 07/24/2022   Procedure: ESOPHAGOGASTRODUODENOSCOPY (EGD) WITH PROPOFOL ;  Surgeon: Urban Garden, MD;  [facility];  Service: Gastroenterology;  Laterality: N/A;  12:45;ASA 1-2   HOT HEMOSTASIS  07/24/2022   Procedure: HOT HEMOSTASIS (ARGON PLASMA COAGULATION/BICAP);  Surgeon: Umberto Ganong, Bearl Limes, MD;  [facility];  Service: Gastroenterology;;   IR 3D INDEPENDENT WKST  04/25/2021   IR ANGIO INTRA EXTRACRAN SEL COM CAROTID INNOMINATE UNI R MOD  SED  04/25/2021   IR ANGIO INTRA EXTRACRAN SEL INTERNAL CAROTID UNI L MOD SED  04/25/2021   IR ANGIOGRAM FOLLOW UP STUDY  04/25/2021   IR CT HEAD LTD  04/25/2021   IR NEURO EACH ADD'L AFTER BASIC UNI LEFT (MS)  04/25/2021   IR RADIOLOGIST EVAL & MGMT  03/22/2021   IR RADIOLOGIST EVAL & MGMT  03/28/2021   IR RADIOLOGIST EVAL & MGMT  05/15/2021   IR RADIOLOGIST EVAL & MGMT  09/28/2021   IR TRANSCATH/EMBOLIZ  04/25/2021   IR US  GUIDE VASC ACCESS RIGHT  04/25/2021   MYRINGOTOMY WITH TUBE PLACEMENT Right 02/04/2017   Procedure: REVISION  OF RIGHT MYRINGOTOMY WITH TUBE PLACEMENT, WITH EXAM OF LEFT EAR;  Surgeon: Reynold Caves, MD;  Location: [facility];  Service: ENT;  Laterality: Right;   NASAL SINUS SURGERY  2016   polypectomy   OPEN REDUCTION INTERNAL FIXATION (ORIF) TIBIA/FIBULA FRACTURE Left 02/02/2023   Procedure: OPEN REDUCTION INTERNAL FIXATION (ORIF) LEFT TIBIA/FIBULA FRACTURE;  Surgeon: Diedra Fowler, MD;  Location: [facility];  Service: Orthopedics;  Laterality: Left;   POLYPECTOMY  07/25/2020   Procedure: POLYPECTOMY;  Surgeon: Urban Garden, MD;  [facility];  Service: Gastroenterology;;   POLYPECTOMY  08/29/2020   Procedure: POLYPECTOMY;  Surgeon: Urban Garden, MD;  [facility];  Service: Gastroenterology;;   POLYPECTOMY  07/24/2022   Procedure: POLYPECTOMY;  Surgeon: Urban Garden, MD;  [facility];  Service: Gastroenterology;;   [facility] REMOVAL N/A 01/06/2019   Procedure: REMOVAL PORT-A-CATH;  Surgeon: Boyce Byes, MD;  Location: [facility];  Service: General;  Laterality: N/A;   PORTACATH PLACEMENT Right 06/16/2018   Procedure: INSERTION PORT-A-CATH;  Surgeon: Boyce Byes, MD;  Location: [facility];  Service: General;  Laterality: Right;   RADIOLOGY WITH ANESTHESIA N/A 04/25/2021   Procedure: IR WITH ANESTHESIA EMBOLIZATION;  Surgeon: Luellen Sages, MD;  Location: [facility];  Service: Radiology;  Laterality: N/A;   SCLEROTHERAPY  07/24/2022   Procedure: SCLEROTHERAPY;  Surgeon: Umberto Ganong, Bearl Limes, MD;  [facility];  Service: Gastroenterology;;   SUBMUCOSAL LIFTING INJECTION  08/29/2020   Procedure: SUBMUCOSAL LIFTING INJECTION;  Surgeon: Umberto Ganong, Bearl Limes, MD;  [facility];  Service: Gastroenterology;;   SUBMUCOSAL LIFTING INJECTION  07/24/2022   Procedure: SUBMUCOSAL LIFTING INJECTION;  Surgeon: Urban Garden, MD;  [facility];  Service:  Gastroenterology;;   TONSILLECTOMY  1970   Patient Active Problem List   Diagnosis Date Noted   Closed fracture of left fibula and tibia 02/02/2023   Acute diarrhea 12/23/2022   Functional dyspepsia 07/01/2022   DOE (dyspnea on exertion) 01/15/2022   Nausea with vomiting 05/28/2021   Duodenal adenoma 05/28/2021   Brain aneurysm 04/25/2021   History of herpes simplex infection 01/08/2021   Papanicolaou smear, as part of routine gynecological examination 01/08/2021   CVA (cerebral vascular accident) (HCC) 01/04/2021   Acute ischemic stroke (HCC) 01/03/2021   Hypokalemia 01/03/2021   Cerebral aneurysm 01/03/2021   Musculoskeletal pain 01/03/2021   Early satiety 11/27/2020   Chronic abdominal pain 07/13/2020   Port-A-Cath in place 06/23/2018   Malignant neoplasm of upper-inner quadrant of left breast in female, estrogen receptor positive (HCC) 04/16/2018   Essential hypertension 03/21/2015   Hyperlipidemia 03/21/2015   Genital herpes 03/21/2015   Fecal urgency 03/21/2015   GERD (gastroesophageal reflux disease) 03/21/2015    PCP: Alston Jerry, MD  REFERRING PROVIDER: Sulema Endo,  Asenath Blacker, MD  REFERRING DIAG: S82.202D,S82.402D (ICD-10-CM) - Closed fracture of left tibia and fibula with routine healing, subsequent encounter   THERAPY DIAG:  Muscle weakness (generalized)  Closed fracture of left tibia and fibula with routine healing, subsequent encounter  Unsteadiness on feet  Rationale for Evaluation and Treatment: Rehabilitation  ONSET DATE: 02/02/2023  SUBJECTIVE:   SUBJECTIVE STATEMENT: Pt drove to therapy today her ankle is feeling better   PERTINENT HISTORY: Closed Fx of Left tibia and fibula Parkinson's Disease without Dyskinesia, without mentation of fluctioantios Cerebral Aneurysm PAIN:  Are you having pain? No and Yes: NPRS scale: 4 Pain location: Lt ankle  Pain description: throb Aggravating factors: activitiy  Relieving factors: rest  PRECAUTIONS:  Left CAM Boot-can transition to regular after a month in the boot  RED FLAGS: None   WEIGHT BEARING RESTRICTIONS: WBAT on LLE  FALLS:  Has patient fallen in last 6 months? Fall is MOI   PATIENT GOALS: To be able to walk on my own with AD  NEXT MD VISIT: in 3 months  OBJECTIVE:  Note: Objective measures were completed at Evaluation unless otherwise noted.  DIAGNOSTIC FINDINGS:   PATIENT SURVEYS:  LEFS 21/80 = 26.3%  06/24/23: 29/80=  36.3% COGNITION: Overall cognitive status: Within functional limits for tasks assessed     SENSATION: WFL  POSTURE: rounded shoulders and forward head  LOWER EXTREMITY ROM:  Active ROM Right eval Left eval Left 06/24/23:  Hip flexion     Hip extension     Hip abduction     Hip adduction     Hip internal rotation     Hip external rotation     Knee flexion     Knee extension     Ankle dorsiflexion 15 A5  P8 A8 I P12  Ankle plantarflexion   A 8   Ankle inversion WFL A2  P15 20  Ankle eversion WFL A10 20 A12 I P 20   (Blank rows = not tested)  LOWER EXTREMITY MMT:  MMT Right eval Left eval Right  06/24/23 Left 06/24/23  Hip flexion      Hip extension      Hip abduction      Hip adduction      Hip internal rotation      Hip external rotation      Knee flexion      Knee extension 3+ 4+ 4+ 4  Ankle dorsiflexion 2 4 4  3-  Ankle plantarflexion 2+ 4 4 3   Ankle inversion 2+ 4 4 3   Ankle eversion 2+ 4 4 3-   (Blank rows = not tested)   FUNCTIONAL TESTS:  30 Second Chair Stand Test: 5x  Norms:   Age 32-64 71-69 70-74 75-79 80-84 85-89 90-94  Women 15 15 14 13 12 11 9   Men 17 16 15 14 13 11 9    TUG: 47.96 seconds   06/24/23: 126ft with SPC in shoe 30 Second Chair Stand Test: 8x no HHA TUG: 24.07 GAIT: Distance walked: 76ft Assistive device utilized: Environmental consultant - 4 wheeled Level of assistance: SBA Comments: stepto pattern with RLE leading and then LLE  TREATMENT DATE:  07/10/2023  Nustep for warm up x 7 minutes. Rocker board dorsi/plantar x 10 Slant board stretch 30 x 3  Tandem stance 30 with Rt in back then front x 2 Gastroc stretch 20 x 3 Red theraband exercises Dorsiflexion x 5 Inversion x 5 Eversion x5 Instruction on walking with equal strides.    PATIENT EDUCATION:  Education details: PT Evaluation, findings, prognosis, frequency, attendance policy, and HEP. Person educated: Patient Education method: Explanation and Demonstration Education comprehension: verbalized understanding  HOME EXERCISE PROGRAM: Access Code: [email or web address] 07/10/23 - Seated Ankle Alphabet  - 1 x daily - 7 x weekly - 1 sets - 1 reps - Ankle Dorsiflexion with Resistance  - 1 x daily - 7 x weekly - 1 sets - 10 reps - 5 hold - Seated Ankle Eversion with Resistance  - 1 x daily - 7 x weekly - 1 sets - 10 reps - 5 hold - Ankle Inversion with Resistance  - 1 x daily - 7 x weekly - 1 sets - 10 reps - 5 hold - Gastroc Stretch on Wall  - 2 x daily - 7 x weekly - 1 sets - 3 reps - 30 hold  URL: [email or web address] Date: 05/16/2023 Prepared by: Irene Mannheim  Exercises - Seated Ankle Dorsiflexion Stretch  - 1 x daily - 7 x weekly - 3 sets - 10 reps - Seated Ankle Pumps  - 1 x daily - 7 x weekly - 3 sets - 20 reps - Seated Ankle Circles  - 1 x daily - 7 x weekly - 3 sets - 26 reps - Sit to Stand with Counter Support  - 1 x daily - 7 x weekly - 3 sets - 10 reps  ASSESSMENT:  CLINICAL IMPRESSION: Patient continues to demonstrate decreased LLE strength/ROM, decreased gait quality and balance. Therapist added to HEP to progress ROM and strength.  Pt has decreased stride length on Rt LE.  Therapist instructed pt to increase stride making sure Rt LE passes LT when ambulating.  PT ambulation is very slow at this point.   Patient would continue to benefit from skilled physical  therapy for increased endurance with ambulation, increased LE strength, and improved balance for improved quality of life, improved independence with gait training, functional mobility and continued progress towards therapy goals.    OBJECTIVE IMPAIRMENTS: Abnormal gait, decreased activity tolerance, decreased balance, decreased mobility, difficulty walking, decreased ROM, decreased strength, hypomobility, postural dysfunction, and pain.   ACTIVITY LIMITATIONS: carrying, lifting, bending, standing, squatting, stairs, transfers, bed mobility, and locomotion level  PARTICIPATION LIMITATIONS: meal prep, driving, community activity, occupation, and yard work  PERSONAL FACTORS: Age and 1-2 comorbidities: Parkinson's are also affecting patient's functional outcome.   REHAB POTENTIAL: Good  CLINICAL DECISION MAKING: Stable/uncomplicated  EVALUATION COMPLEXITY: Low   GOALS: Goals reviewed with patient? Yes  SHORT TERM GOALS: Target date: 07/11/23  Pt will be independent with HEP in order to demonstrate participation in Physical Therapy POC.  Baseline:  06/24/23:  Reports compliance with HEP. Goal status: MET  2.  Pt will 3/10 pain during mobility in order to demonstrate improved pain while performing ADLs.  Baseline: 06/24/23:  Pain scale average 2-8/10 Goal status: INITIAL  LONG TERM GOALS: Target date: 07/25/23  Pt will improve 30 Second Chair Stand Test by at least 2 reps in order to demonstrate improved functional strength to return to desired activities.  Baseline: see objective.; 06/24/23:  able to complete 8 times no HHA, was 5 initial eval.  Goal status: MET  2.  Pt will improve TUG by at least 15 seconds in order to demonstrate improved functional ambulatory capacity in community setting.  Baseline: see objective. 07/14/23:  Improved to 24.07 was 47.96 seconds Goal status: MET  3.  Pt will improve LEFS score by 20% in order to demonstrate improved pain with functional goals and  outcomes. Baseline: see objective. ; 06/24/23: 29/80=  36.3% was LEFS 21/80 = 26.3% Goal status: IN PROGRESS  4.  Pt will improve LLE ankle ROM by  at least 5 degrees in order to improve symmetry during functional activities.. Baseline: see objective.  Goal status: IN PROGRESS  5.  Pt will improve BLE MMT at least 1/2 grade  in order to improve endurance, balance and tolerance during functional activities.. Baseline: see objective.  Goal status: IN PROGRESS   PLAN:  PT FREQUENCY: 2x/week  PT DURATION: 4 weeks  PLANNED INTERVENTIONS: [id number]- PT Re-evaluation, 97750- Physical Performance Testing, 97110-Therapeutic exercises, 97530- Therapeutic activity, [id number]- Neuromuscular re-education, 97535- Self Care, 16109- Manual therapy, [phone]- Gait training, [phone]- Electrical stimulation (unattended), [phone]- Electrical stimulation (manual), Patient/Family education, Balance training, Stair training, Joint mobilization, Joint manipulation, Spinal manipulation, Spinal mobilization, DME instructions, Cryotherapy, and Moist heat  PLAN FOR NEXT SESSION: continue to improve functional ROM and strength of Lt ankle and gait with LRAD.  Leodis Rainwater, PT CLT [phone]

## 2023-07-11 ENCOUNTER — Other Ambulatory Visit: Payer: Self-pay | Admitting: *Deleted

## 2023-07-11 DIAGNOSIS — Z170 Estrogen receptor positive status [ER+]: Secondary | ICD-10-CM

## 2023-07-11 DIAGNOSIS — C50212 Malignant neoplasm of upper-inner quadrant of left female breast: Secondary | ICD-10-CM

## 2023-07-11 NOTE — Progress Notes (Signed)
 Received call from pt with complaint of ongoing intermittent discomfort in left breast.  Pt recently has breast US  which showed stable oil cyst. Per MD pt needing [facility] visit for breast exam and further evaluation.  Appt scheduled pt notified and verbalized understanding.

## 2023-07-12 DIAGNOSIS — Z6823 Body mass index (BMI) 23.0-23.9, adult: Secondary | ICD-10-CM | POA: Diagnosis not present

## 2023-07-12 DIAGNOSIS — I10 Essential (primary) hypertension: Secondary | ICD-10-CM | POA: Diagnosis not present

## 2023-07-15 ENCOUNTER — Ambulatory Visit (HOSPITAL_COMMUNITY)

## 2023-07-15 ENCOUNTER — Encounter (HOSPITAL_COMMUNITY): Payer: Self-pay

## 2023-07-15 DIAGNOSIS — S82402D Unspecified fracture of shaft of left fibula, subsequent encounter for closed fracture with routine healing: Secondary | ICD-10-CM | POA: Diagnosis not present

## 2023-07-15 DIAGNOSIS — S82202D Unspecified fracture of shaft of left tibia, subsequent encounter for closed fracture with routine healing: Secondary | ICD-10-CM

## 2023-07-15 DIAGNOSIS — Z7409 Other reduced mobility: Secondary | ICD-10-CM

## 2023-07-15 DIAGNOSIS — M6281 Muscle weakness (generalized): Secondary | ICD-10-CM | POA: Diagnosis not present

## 2023-07-15 DIAGNOSIS — R2681 Unsteadiness on feet: Secondary | ICD-10-CM | POA: Diagnosis not present

## 2023-07-15 NOTE — Therapy (Signed)
 OUTPATIENT PHYSICAL THERAPY LOWER EXTREMITY TREATMENT  Patient Name: Kimberly Mccormick MRN: [id number] DOB:12-Nov-1950, 73 y.o., female Today's Date: 07/15/2023  END OF SESSION:  PT End of Session - 07/15/23 1258     Visit Number 15    Number of Visits 18    Date for PT Re-Evaluation 07/25/23    Authorization Type [employer] Dual Complete    Authorization Time Period no auth; no limit    Progress Note Due on Visit 18    PT Start Time 1259    PT Stop Time 1342    PT Time Calculation (min) 43 min    Equipment Utilized During Treatment Gait belt    Activity Tolerance Patient tolerated treatment well    Behavior During Therapy WFL for tasks assessed/performed                 Past Medical History:  Diagnosis Date   Aneurysm of left internal carotid artery    Small supraclinoid 5 mm   Breast cancer (HCC)    Left s/p lumpectomy (05/06/2018) and adjuvant chemotherapy (06/23/2018) with Adriamycin and Cytoxan  x4 followed by Taxol weekly x12   DDD (degenerative disc disease), lumbar    Essential hypertension    Genital warts    GERD (gastroesophageal reflux disease)    History of anemia    History of renal insufficiency    Hypertension    Insomnia    Iron deficiency anemia    Ischemic stroke [facility])    December 2022   Mixed hyperlipidemia    Osteoarthritis    Parkinson's disease (HCC)    Peripheral neuropathy    Related to chemotherapy   Personal history of chemotherapy 2020   Past Surgical History:  Procedure Laterality Date   APPENDECTOMY  1966   BIOPSY  08/29/2020   Procedure: BIOPSY;  Surgeon: Eartha Angelia Sieving, MD;  [facility]: [facility];  Service: Gastroenterology;;   BIOPSY  07/24/2022   Procedure: BIOPSY;  Surgeon: Eartha Angelia Sieving, MD;  [facility]: [facility];  Service: Gastroenterology;;   BREAST LUMPECTOMY WITH RADIOACTIVE SEED AND SENTINEL LYMPH NODE BIOPSY Left 05/06/2018   Procedure: LEFT BREAST LUMPECTOMY WITH RADIOACTIVE SEED AND LEFT DEEP AXILLARY  SENTINEL LYMPH NODE BIOPSY AND BLUE DYE INJECTION;  Surgeon: Gail Favorite, MD;  Location: [facility];  Service: General;  Laterality: Left;   CATARACT EXTRACTION W/PHACO Left 01/10/2014   Procedure: CATARACT EXTRACTION PHACO AND INTRAOCULAR LENS PLACEMENT ; CDE:  4.94;  Surgeon: Dow JULIANNA Burke, MD;  Location: [facility];  Service: Ophthalmology;  Laterality: Left;   CESAREAN SECTION  1986   CHOLECYSTECTOMY     COLONOSCOPY WITH PROPOFOL  N/A 07/25/2020   Procedure: COLONOSCOPY WITH PROPOFOL ;  Surgeon: Eartha Angelia Sieving, MD;  [facility];  Service: Gastroenterology;  Laterality: N/A;  10:55   ESOPHAGOGASTRODUODENOSCOPY (EGD) WITH PROPOFOL  N/A 08/29/2020   Procedure: ESOPHAGOGASTRODUODENOSCOPY (EGD) WITH PROPOFOL ;  Surgeon: Eartha Angelia Sieving, MD;  [facility];  Service: Gastroenterology;  Laterality: N/A;  12:00   ESOPHAGOGASTRODUODENOSCOPY (EGD) WITH PROPOFOL  N/A 07/24/2022   Procedure: ESOPHAGOGASTRODUODENOSCOPY (EGD) WITH PROPOFOL ;  Surgeon: Eartha Angelia Sieving, MD;  [facility];  Service: Gastroenterology;  Laterality: N/A;  12:45;ASA 1-2   HOT HEMOSTASIS  07/24/2022   Procedure: HOT HEMOSTASIS (ARGON PLASMA COAGULATION/BICAP);  Surgeon: Eartha Angelia, Sieving, MD;  [facility];  Service: Gastroenterology;;   IR 3D INDEPENDENT WKST  04/25/2021   IR ANGIO INTRA EXTRACRAN SEL COM CAROTID INNOMINATE UNI R  MOD SED  04/25/2021   IR ANGIO INTRA EXTRACRAN SEL INTERNAL CAROTID UNI L MOD SED  04/25/2021   IR ANGIOGRAM FOLLOW UP STUDY  04/25/2021   IR CT HEAD LTD  04/25/2021   IR NEURO EACH ADD'L AFTER BASIC UNI LEFT (MS)  04/25/2021   IR RADIOLOGIST EVAL & MGMT  03/22/2021   IR RADIOLOGIST EVAL & MGMT  03/28/2021   IR RADIOLOGIST EVAL & MGMT  05/15/2021   IR RADIOLOGIST EVAL & MGMT  09/28/2021   IR TRANSCATH/EMBOLIZ  04/25/2021   IR US  GUIDE VASC ACCESS RIGHT  04/25/2021   MYRINGOTOMY WITH TUBE PLACEMENT Right 02/04/2017   Procedure: REVISION  OF RIGHT MYRINGOTOMY WITH TUBE PLACEMENT, WITH EXAM OF LEFT EAR;  Surgeon: Karis Clunes, MD;  Location: [facility];  Service: ENT;  Laterality: Right;   NASAL SINUS SURGERY  2016   polypectomy   OPEN REDUCTION INTERNAL FIXATION (ORIF) TIBIA/FIBULA FRACTURE Left 02/02/2023   Procedure: OPEN REDUCTION INTERNAL FIXATION (ORIF) LEFT TIBIA/FIBULA FRACTURE;  Surgeon: Georgina Ozell LABOR, MD;  Location: [facility];  Service: Orthopedics;  Laterality: Left;   POLYPECTOMY  07/25/2020   Procedure: POLYPECTOMY;  Surgeon: Eartha Angelia Sieving, MD;  [facility];  Service: Gastroenterology;;   POLYPECTOMY  08/29/2020   Procedure: POLYPECTOMY;  Surgeon: Eartha Angelia Sieving, MD;  [facility];  Service: Gastroenterology;;   POLYPECTOMY  07/24/2022   Procedure: POLYPECTOMY;  Surgeon: Eartha Angelia Sieving, MD;  [facility];  Service: Gastroenterology;;   [facility] REMOVAL N/A 01/06/2019   Procedure: REMOVAL PORT-A-CATH;  Surgeon: Gail Favorite, MD;  Location: [facility];  Service: General;  Laterality: N/A;   PORTACATH PLACEMENT Right 06/16/2018   Procedure: INSERTION PORT-A-CATH;  Surgeon: Gail Favorite, MD;  Location: [facility];  Service: General;  Laterality: Right;   RADIOLOGY WITH ANESTHESIA N/A 04/25/2021   Procedure: IR WITH ANESTHESIA EMBOLIZATION;  Surgeon: Dolphus Carrion, MD;  Location: [facility];  Service: Radiology;  Laterality: N/A;   SCLEROTHERAPY  07/24/2022   Procedure: SCLEROTHERAPY;  Surgeon: Eartha Angelia, Sieving, MD;  [facility];  Service: Gastroenterology;;   SUBMUCOSAL LIFTING INJECTION  08/29/2020   Procedure: SUBMUCOSAL LIFTING INJECTION;  Surgeon: Eartha Angelia, Sieving, MD;  [facility];  Service: Gastroenterology;;   SUBMUCOSAL LIFTING INJECTION  07/24/2022   Procedure: SUBMUCOSAL LIFTING INJECTION;  Surgeon: Eartha Angelia Sieving, MD;  [facility];  Service:  Gastroenterology;;   TONSILLECTOMY  1970   Patient Active Problem List   Diagnosis Date Noted   Closed fracture of left fibula and tibia 02/02/2023   Acute diarrhea 12/23/2022   Functional dyspepsia 07/01/2022   DOE (dyspnea on exertion) 01/15/2022   Nausea with vomiting 05/28/2021   Duodenal adenoma 05/28/2021   Brain aneurysm 04/25/2021   History of herpes simplex infection 01/08/2021   Papanicolaou smear, as part of routine gynecological examination 01/08/2021   CVA (cerebral vascular accident) (HCC) 01/04/2021   Acute ischemic stroke (HCC) 01/03/2021   Hypokalemia 01/03/2021   Cerebral aneurysm 01/03/2021   Musculoskeletal pain 01/03/2021   Early satiety 11/27/2020   Chronic abdominal pain 07/13/2020   Port-A-Cath in place 06/23/2018   Malignant neoplasm of upper-inner quadrant of left breast in female, estrogen receptor positive (HCC) 04/16/2018   Essential hypertension 03/21/2015   Hyperlipidemia 03/21/2015   Genital herpes 03/21/2015   Fecal urgency 03/21/2015   GERD (gastroesophageal reflux disease) 03/21/2015    PCP: [name], MD  REFERRING PROVIDER:  Georgina Ozell LABOR, MD  REFERRING DIAG: S82.202D,S82.402D (ICD-10-CM) - Closed fracture of left tibia and fibula with routine healing, subsequent encounter   THERAPY DIAG:  Muscle weakness (generalized)  Closed fracture of left tibia and fibula with routine healing, subsequent encounter  Unsteadiness on feet  Impaired functional mobility, balance, gait, and endurance  Rationale for Evaluation and Treatment: Rehabilitation  ONSET DATE: 02/02/2023  SUBJECTIVE:   SUBJECTIVE STATEMENT: Pt states that she is dealing with bilateral knee stiffness following long periods of driving. Pt states left knee is hurting but only 3/10 left ankle pain today.   PERTINENT HISTORY: Closed Fx of Left tibia and fibula Parkinson's Disease without Dyskinesia, without mentation of fluctioantios Cerebral Aneurysm PAIN:   Are you having pain? No and Yes: NPRS scale: 4 Pain location: Lt ankle  Pain description: throb Aggravating factors: activitiy  Relieving factors: rest  PRECAUTIONS: Left CAM Boot-can transition to regular after a month in the boot  RED FLAGS: None   WEIGHT BEARING RESTRICTIONS: WBAT on LLE  FALLS:  Has patient fallen in last 6 months? Fall is MOI   PATIENT GOALS: To be able to walk on my own with AD  NEXT MD VISIT: in 3 months  OBJECTIVE:  Note: Objective measures were completed at Evaluation unless otherwise noted.  DIAGNOSTIC FINDINGS:   PATIENT SURVEYS:  LEFS 21/80 = 26.3%  06/24/23: 29/80=  36.3% COGNITION: Overall cognitive status: Within functional limits for tasks assessed     SENSATION: WFL  POSTURE: rounded shoulders and forward head  LOWER EXTREMITY ROM:  Active ROM Right eval Left eval Left 06/24/23:  Hip flexion     Hip extension     Hip abduction     Hip adduction     Hip internal rotation     Hip external rotation     Knee flexion     Knee extension     Ankle dorsiflexion 15 A5  P8 A8 I P12  Ankle plantarflexion   A 8   Ankle inversion WFL A2  P15 20  Ankle eversion WFL A10 20 A12 I P 20   (Blank rows = not tested)  LOWER EXTREMITY MMT:  MMT Right eval Left eval Right  06/24/23 Left 06/24/23  Hip flexion      Hip extension      Hip abduction      Hip adduction      Hip internal rotation      Hip external rotation      Knee flexion      Knee extension 3+ 4+ 4+ 4  Ankle dorsiflexion 2 4 4  3-  Ankle plantarflexion 2+ 4 4 3   Ankle inversion 2+ 4 4 3   Ankle eversion 2+ 4 4 3-   (Blank rows = not tested)   FUNCTIONAL TESTS:  30 Second Chair Stand Test: 5x  Norms:   Age 71-64 52-69 70-74 75-79 80-84 85-89 90-94  Women 15 15 14 13 12 11 9   Men 17 16 15 14 13 11 9    TUG: 47.96 seconds   06/24/23: 139ft with SPC in shoe 30 Second Chair Stand Test: 8x no HHA TUG: 24.07 GAIT: Distance walked: 47ft Assistive device  utilized: Environmental consultant - 4 wheeled Level of assistance: SBA Comments: stepto pattern with RLE leading and then LLE  TREATMENT DATE:  07/15/2023  Therapeutic Exercise: -Stationary bike, 3 minutes, 60 spm, level 1 resistance, pt states this wore her out -Supine Ankle AROM each direction 20 reps  -TKE, 6 inch step, 2 sets of 10 reps, BUE support, pt cued for decreased UE support -Aeromat walks, tandem stepping and lateral stepping, 1 lap each variation, no UE support for about 5 steps at a time -Leg press 1 set of 10 bilateral plate 3, 1 set of 10 LLE only plate 2 -Treadmill, 2 minutes, 30 seconds, 2.0 grade, pt cued for increased foot clearance and bigger strides -Sled push/pull, 40 lbs first lap, 20 lbs second lap   07/10/2023  Nustep for warm up x 7 minutes. Rocker board dorsi/plantar x 10 Slant board stretch 30 x 3  Tandem stance 30 with Rt in back then front x 2 Gastroc stretch 20 x 3 Red theraband exercises Dorsiflexion x 5 Inversion x 5 Eversion x5 Instruction on walking with equal strides.    PATIENT EDUCATION:  Education details: PT Evaluation, findings, prognosis, frequency, attendance policy, and HEP. Person educated: Patient Education method: Explanation and Demonstration Education comprehension: verbalized understanding  HOME EXERCISE PROGRAM: Access Code: [email or web address] 07/10/23 - Seated Ankle Alphabet  - 1 x daily - 7 x weekly - 1 sets - 1 reps - Ankle Dorsiflexion with Resistance  - 1 x daily - 7 x weekly - 1 sets - 10 reps - 5 hold - Seated Ankle Eversion with Resistance  - 1 x daily - 7 x weekly - 1 sets - 10 reps - 5 hold - Ankle Inversion with Resistance  - 1 x daily - 7 x weekly - 1 sets - 10 reps - 5 hold - Gastroc Stretch on Wall  - 2 x daily - 7 x weekly - 1 sets - 3 reps - 30 hold  URL: [email or web address] Date:  05/16/2023 Prepared by: Omega Bottcher  Exercises - Seated Ankle Dorsiflexion Stretch  - 1 x daily - 7 x weekly - 3 sets - 10 reps - Seated Ankle Pumps  - 1 x daily - 7 x weekly - 3 sets - 20 reps - Seated Ankle Circles  - 1 x daily - 7 x weekly - 3 sets - 26 reps - Sit to Stand with Counter Support  - 1 x daily - 7 x weekly - 3 sets - 10 reps  ASSESSMENT:  CLINICAL IMPRESSION: Patient continues to demonstrate pain in LLE, decreased LLE strength, decreased gait quality and balance. Patient also demonstrates decreased endurance with aerobic based exercise during today's session. Patient able to progress dynamic balance and core activation exercises today with treadmill trial and leg press, good performance with verbal cueing. Pt reports increase left foot pain with treadmill activity, pt still not tying shoe due to swelling of left foot. Patient would continue to benefit from skilled physical therapy for increased endurance with ambulation, increased LLE strength, and improved balance for improved quality of life, improved independence with gait training and continued progress towards therapy goals.     OBJECTIVE IMPAIRMENTS: Abnormal gait, decreased activity tolerance, decreased balance, decreased mobility, difficulty walking, decreased ROM, decreased strength, hypomobility, postural dysfunction, and pain.   ACTIVITY LIMITATIONS: carrying, lifting, bending, standing, squatting, stairs, transfers, bed mobility, and locomotion level  PARTICIPATION LIMITATIONS: meal prep, driving, community activity, occupation, and yard work  PERSONAL FACTORS: Age and 1-2 comorbidities: Parkinson's are also affecting patient's functional outcome.   REHAB POTENTIAL: Good  CLINICAL DECISION MAKING: Stable/uncomplicated  EVALUATION COMPLEXITY: Low  GOALS: Goals reviewed with patient? Yes  SHORT TERM GOALS: Target date: 07/11/23  Pt will be independent with HEP in order to demonstrate participation in  Physical Therapy POC.  Baseline:  06/24/23:  Reports compliance with HEP. Goal status: MET  2.  Pt will 3/10 pain during mobility in order to demonstrate improved pain while performing ADLs.  Baseline: 06/24/23:  Pain scale average 2-8/10 Goal status: INITIAL  LONG TERM GOALS: Target date: 07/25/23  Pt will improve 30 Second Chair Stand Test by at least 2 reps in order to demonstrate improved functional strength to return to desired activities.  Baseline: see objective.; 06/24/23:  able to complete 8 times no HHA, was 5 initial eval. Goal status: MET  2.  Pt will improve TUG by at least 15 seconds in order to demonstrate improved functional ambulatory capacity in community setting.  Baseline: see objective. 07/14/23:  Improved to 24.07 was 47.96 seconds Goal status: MET  3.  Pt will improve LEFS score by 20% in order to demonstrate improved pain with functional goals and outcomes. Baseline: see objective. ; 06/24/23: 29/80=  36.3% was LEFS 21/80 = 26.3% Goal status: IN PROGRESS  4.  Pt will improve LLE ankle ROM by  at least 5 degrees in order to improve symmetry during functional activities.. Baseline: see objective.  Goal status: IN PROGRESS  5.  Pt will improve BLE MMT at least 1/2 grade  in order to improve endurance, balance and tolerance during functional activities.. Baseline: see objective.  Goal status: IN PROGRESS   PLAN:  PT FREQUENCY: 2x/week  PT DURATION: 4 weeks  PLANNED INTERVENTIONS: [id number]- PT Re-evaluation, 97750- Physical Performance Testing, 97110-Therapeutic exercises, 97530- Therapeutic activity, [id number]- Neuromuscular re-education, 97535- Self Care, [id number]- Manual therapy, [phone]- Gait training, [phone]- Electrical stimulation (unattended), [phone]- Electrical stimulation (manual), Patient/Family education, Balance training, Stair training, Joint mobilization, Joint manipulation, Spinal manipulation, Spinal mobilization, DME instructions, Cryotherapy, and Moist heat  PLAN  FOR NEXT SESSION: continue to improve functional ROM and strength of Lt ankle and gait with LRAD.    Aariz Maish, PT, DPT [facility] Office: [phone] 1:52 PM, 07/15/23

## 2023-07-16 DIAGNOSIS — I10 Essential (primary) hypertension: Secondary | ICD-10-CM | POA: Diagnosis not present

## 2023-07-16 DIAGNOSIS — Z880 Allergy status to penicillin: Secondary | ICD-10-CM | POA: Diagnosis not present

## 2023-07-16 DIAGNOSIS — Z7982 Long term (current) use of aspirin: Secondary | ICD-10-CM | POA: Diagnosis not present

## 2023-07-16 DIAGNOSIS — M79602 Pain in left arm: Secondary | ICD-10-CM | POA: Diagnosis not present

## 2023-07-16 DIAGNOSIS — Z91012 Allergy to eggs: Secondary | ICD-10-CM | POA: Diagnosis not present

## 2023-07-16 DIAGNOSIS — M25512 Pain in left shoulder: Secondary | ICD-10-CM | POA: Diagnosis not present

## 2023-07-16 DIAGNOSIS — Z888 Allergy status to other drugs, medicaments and biological substances status: Secondary | ICD-10-CM | POA: Diagnosis not present

## 2023-07-16 DIAGNOSIS — Z6824 Body mass index (BMI) 24.0-24.9, adult: Secondary | ICD-10-CM | POA: Diagnosis not present

## 2023-07-17 ENCOUNTER — Encounter (HOSPITAL_COMMUNITY)

## 2023-07-19 ENCOUNTER — Encounter (HOSPITAL_COMMUNITY): Payer: Self-pay | Admitting: Interventional Radiology

## 2023-07-21 ENCOUNTER — Ambulatory Visit (INDEPENDENT_AMBULATORY_CARE_PROVIDER_SITE_OTHER)

## 2023-07-21 ENCOUNTER — Ambulatory Visit (HOSPITAL_COMMUNITY): Admitting: Physical Therapy

## 2023-07-21 ENCOUNTER — Ambulatory Visit (INDEPENDENT_AMBULATORY_CARE_PROVIDER_SITE_OTHER): Admitting: Orthopedic Surgery

## 2023-07-21 DIAGNOSIS — M79672 Pain in left foot: Secondary | ICD-10-CM

## 2023-07-21 DIAGNOSIS — S82202D Unspecified fracture of shaft of left tibia, subsequent encounter for closed fracture with routine healing: Secondary | ICD-10-CM | POA: Diagnosis not present

## 2023-07-21 DIAGNOSIS — M6281 Muscle weakness (generalized): Secondary | ICD-10-CM

## 2023-07-21 DIAGNOSIS — S82402D Unspecified fracture of shaft of left fibula, subsequent encounter for closed fracture with routine healing: Secondary | ICD-10-CM | POA: Diagnosis not present

## 2023-07-21 DIAGNOSIS — R2681 Unsteadiness on feet: Secondary | ICD-10-CM | POA: Diagnosis not present

## 2023-07-21 DIAGNOSIS — Z7409 Other reduced mobility: Secondary | ICD-10-CM | POA: Diagnosis not present

## 2023-07-21 MED ORDER — TRAMADOL HCL 50 MG PO TABS: 50.0000 mg | ORAL_TABLET | Freq: Four times a day (QID) | ORAL | 0 refills | Status: AC | PRN

## 2023-07-21 NOTE — Therapy (Signed)
 OUTPATIENT PHYSICAL THERAPY LOWER EXTREMITY TREATMENT  Patient Name: Kimberly Mccormick MRN: [id number] DOB:1950-03-11, 73 y.o., female Today's Date: 07/21/2023  END OF SESSION:  PT End of Session - 07/21/23 1353     Visit Number 16    Number of Visits 18    Date for PT Re-Evaluation 07/25/23    Authorization Type [employer] Dual Complete    Authorization Time Period no auth; no limit    Progress Note Due on Visit 18    PT Start Time 1348    PT Stop Time 1428    PT Time Calculation (min) 40 min    Equipment Utilized During Treatment Gait belt    Activity Tolerance Patient tolerated treatment well    Behavior During Therapy WFL for tasks assessed/performed                  Past Medical History:  Diagnosis Date   Aneurysm of left internal carotid artery    Small supraclinoid 5 mm   Breast cancer (HCC)    Left s/p lumpectomy (05/06/2018) and adjuvant chemotherapy (06/23/2018) with Adriamycin and Cytoxan  x4 followed by Taxol weekly x12   DDD (degenerative disc disease), lumbar    Essential hypertension    Genital warts    GERD (gastroesophageal reflux disease)    History of anemia    History of renal insufficiency    Hypertension    Insomnia    Iron deficiency anemia    Ischemic stroke [facility])    December 2022   Mixed hyperlipidemia    Osteoarthritis    Parkinson's disease (HCC)    Peripheral neuropathy    Related to chemotherapy   Personal history of chemotherapy 2020   Past Surgical History:  Procedure Laterality Date   APPENDECTOMY  1966   BIOPSY  08/29/2020   Procedure: BIOPSY;  Surgeon: Eartha Angelia Sieving, MD;  [facility];  Service: Gastroenterology;;   BIOPSY  07/24/2022   Procedure: BIOPSY;  Surgeon: Eartha Angelia Sieving, MD;  [facility];  Service: Gastroenterology;;   BREAST LUMPECTOMY WITH RADIOACTIVE SEED AND SENTINEL LYMPH NODE BIOPSY Left 05/06/2018   Procedure: LEFT BREAST LUMPECTOMY WITH RADIOACTIVE SEED AND LEFT DEEP  AXILLARY SENTINEL LYMPH NODE BIOPSY AND BLUE DYE INJECTION;  Surgeon: Gail Favorite, MD;  Location: [facility];  Service: General;  Laterality: Left;   CATARACT EXTRACTION W/PHACO Left 01/10/2014   Procedure: CATARACT EXTRACTION PHACO AND INTRAOCULAR LENS PLACEMENT ; CDE:  4.94;  Surgeon: Dow JULIANNA Burke, MD;  Location: [facility];  Service: Ophthalmology;  Laterality: Left;   CESAREAN SECTION  1986   CHOLECYSTECTOMY     COLONOSCOPY WITH PROPOFOL  N/A 07/25/2020   Procedure: COLONOSCOPY WITH PROPOFOL ;  Surgeon: Eartha Angelia Sieving, MD;  [facility];  Service: Gastroenterology;  Laterality: N/A;  10:55   ESOPHAGOGASTRODUODENOSCOPY (EGD) WITH PROPOFOL  N/A 08/29/2020   Procedure: ESOPHAGOGASTRODUODENOSCOPY (EGD) WITH PROPOFOL ;  Surgeon: Eartha Angelia Sieving, MD;  [facility];  Service: Gastroenterology;  Laterality: N/A;  12:00   ESOPHAGOGASTRODUODENOSCOPY (EGD) WITH PROPOFOL  N/A 07/24/2022   Procedure: ESOPHAGOGASTRODUODENOSCOPY (EGD) WITH PROPOFOL ;  Surgeon: Eartha Angelia Sieving, MD;  [facility];  Service: Gastroenterology;  Laterality: N/A;  12:45;ASA 1-2   HOT HEMOSTASIS  07/24/2022   Procedure: HOT HEMOSTASIS (ARGON PLASMA COAGULATION/BICAP);  Surgeon: Eartha Angelia, Sieving, MD;  [facility];  Service: Gastroenterology;;   IR 3D INDEPENDENT WKST  04/25/2021   IR ANGIO INTRA EXTRACRAN SEL COM CAROTID INNOMINATE UNI  R MOD SED  04/25/2021   IR ANGIO INTRA EXTRACRAN SEL INTERNAL CAROTID UNI L MOD SED  04/25/2021   IR ANGIOGRAM FOLLOW UP STUDY  04/25/2021   IR CT HEAD LTD  04/25/2021   IR NEURO EACH ADD'L AFTER BASIC UNI LEFT (MS)  04/25/2021   IR RADIOLOGIST EVAL & MGMT  03/22/2021   IR RADIOLOGIST EVAL & MGMT  03/28/2021   IR RADIOLOGIST EVAL & MGMT  05/15/2021   IR RADIOLOGIST EVAL & MGMT  09/28/2021   IR TRANSCATH/EMBOLIZ  04/25/2021   IR US  GUIDE VASC ACCESS RIGHT  04/25/2021   MYRINGOTOMY WITH TUBE PLACEMENT Right 02/04/2017   Procedure:  REVISION OF RIGHT MYRINGOTOMY WITH TUBE PLACEMENT, WITH EXAM OF LEFT EAR;  Surgeon: Karis Clunes, MD;  Location: [facility];  Service: ENT;  Laterality: Right;   NASAL SINUS SURGERY  2016   polypectomy   OPEN REDUCTION INTERNAL FIXATION (ORIF) TIBIA/FIBULA FRACTURE Left 02/02/2023   Procedure: OPEN REDUCTION INTERNAL FIXATION (ORIF) LEFT TIBIA/FIBULA FRACTURE;  Surgeon: Georgina Ozell LABOR, MD;  Location: [facility];  Service: Orthopedics;  Laterality: Left;   POLYPECTOMY  07/25/2020   Procedure: POLYPECTOMY;  Surgeon: Eartha Angelia Sieving, MD;  [facility];  Service: Gastroenterology;;   POLYPECTOMY  08/29/2020   Procedure: POLYPECTOMY;  Surgeon: Eartha Angelia Sieving, MD;  [facility];  Service: Gastroenterology;;   POLYPECTOMY  07/24/2022   Procedure: POLYPECTOMY;  Surgeon: Eartha Angelia Sieving, MD;  [facility];  Service: Gastroenterology;;   [facility] REMOVAL N/A 01/06/2019   Procedure: REMOVAL PORT-A-CATH;  Surgeon: Gail Favorite, MD;  Location: [facility];  Service: General;  Laterality: N/A;   PORTACATH PLACEMENT Right 06/16/2018   Procedure: INSERTION PORT-A-CATH;  Surgeon: Gail Favorite, MD;  Location: [facility];  Service: General;  Laterality: Right;   RADIOLOGY WITH ANESTHESIA N/A 04/25/2021   Procedure: IR WITH ANESTHESIA EMBOLIZATION;  Surgeon: Dolphus Carrion, MD;  Location: [facility];  Service: Radiology;  Laterality: N/A;   SCLEROTHERAPY  07/24/2022   Procedure: SCLEROTHERAPY;  Surgeon: Eartha Angelia, Sieving, MD;  [facility];  Service: Gastroenterology;;   SUBMUCOSAL LIFTING INJECTION  08/29/2020   Procedure: SUBMUCOSAL LIFTING INJECTION;  Surgeon: Eartha Angelia, Sieving, MD;  [facility];  Service: Gastroenterology;;   SUBMUCOSAL LIFTING INJECTION  07/24/2022   Procedure: SUBMUCOSAL LIFTING INJECTION;  Surgeon: Eartha Angelia Sieving, MD;  [facility];   Service: Gastroenterology;;   TONSILLECTOMY  1970   Patient Active Problem List   Diagnosis Date Noted   Closed fracture of left fibula and tibia 02/02/2023   Acute diarrhea 12/23/2022   Functional dyspepsia 07/01/2022   DOE (dyspnea on exertion) 01/15/2022   Nausea with vomiting 05/28/2021   Duodenal adenoma 05/28/2021   Brain aneurysm 04/25/2021   History of herpes simplex infection 01/08/2021   Papanicolaou smear, as part of routine gynecological examination 01/08/2021   CVA (cerebral vascular accident) (HCC) 01/04/2021   Acute ischemic stroke (HCC) 01/03/2021   Hypokalemia 01/03/2021   Cerebral aneurysm 01/03/2021   Musculoskeletal pain 01/03/2021   Early satiety 11/27/2020   Chronic abdominal pain 07/13/2020   Port-A-Cath in place 06/23/2018   Malignant neoplasm of upper-inner quadrant of left breast in female, estrogen receptor positive (HCC) 04/16/2018   Essential hypertension 03/21/2015   Hyperlipidemia 03/21/2015   Genital herpes 03/21/2015   Fecal urgency 03/21/2015   GERD (gastroesophageal reflux disease) 03/21/2015    PCP: [name], MD  REFERRING  PROVIDER: Georgina Ozell LABOR, MD  REFERRING DIAG: S82.202D,S82.402D (ICD-10-CM) - Closed fracture of left tibia and fibula with routine healing, subsequent encounter   THERAPY DIAG:  Muscle weakness (generalized)  Closed fracture of left tibia and fibula with routine healing, subsequent encounter  Unsteadiness on feet  Rationale for Evaluation and Treatment: Rehabilitation  ONSET DATE: 02/02/2023  SUBJECTIVE:   SUBJECTIVE STATEMENT: Pt went to MD day after last appt due to Lt UE pain.  Pt was released as nothing discovered. No pain or issues today.   PERTINENT HISTORY: Closed Fx of Left tibia and fibula Parkinson's Disease without Dyskinesia, without mentation of fluctioantios Cerebral Aneurysm PAIN:  Are you having pain? No and Yes: NPRS scale: 4 Pain location: Lt ankle  Pain description:  throb Aggravating factors: activitiy  Relieving factors: rest  PRECAUTIONS: Left CAM Boot-can transition to regular after a month in the boot  RED FLAGS: None   WEIGHT BEARING RESTRICTIONS: WBAT on LLE  FALLS:  Has patient fallen in last 6 months? Fall is MOI   PATIENT GOALS: To be able to walk on my own with AD  NEXT MD VISIT: in 3 months  OBJECTIVE:  Note: Objective measures were completed at Evaluation unless otherwise noted.  DIAGNOSTIC FINDINGS:   PATIENT SURVEYS:  LEFS 21/80 = 26.3%  06/24/23: 29/80=  36.3% COGNITION: Overall cognitive status: Within functional limits for tasks assessed     SENSATION: WFL  POSTURE: rounded shoulders and forward head  LOWER EXTREMITY ROM:  Active ROM Right eval Left eval Left 06/24/23:  Hip flexion     Hip extension     Hip abduction     Hip adduction     Hip internal rotation     Hip external rotation     Knee flexion     Knee extension     Ankle dorsiflexion 15 A5  P8 A8 I P12  Ankle plantarflexion   A 8   Ankle inversion WFL A2  P15 20  Ankle eversion WFL A10 20 A12 I P 20   (Blank rows = not tested)  LOWER EXTREMITY MMT:  MMT Right eval Left eval Right  06/24/23 Left 06/24/23  Hip flexion      Hip extension      Hip abduction      Hip adduction      Hip internal rotation      Hip external rotation      Knee flexion      Knee extension 3+ 4+ 4+ 4  Ankle dorsiflexion 2 4 4  3-  Ankle plantarflexion 2+ 4 4 3   Ankle inversion 2+ 4 4 3   Ankle eversion 2+ 4 4 3-   (Blank rows = not tested)   FUNCTIONAL TESTS:  30 Second Chair Stand Test: 5x  Norms:   Age 42-64 87-69 70-74 75-79 80-84 85-89 90-94  Women 15 15 14 13 12 11 9   Men 17 16 15 14 13 11 9    TUG: 47.96 seconds   06/24/23: 126ft with SPC in shoe 30 Second Chair Stand Test: 8x no HHA TUG: 24.07 GAIT: Distance walked: 22ft Assistive device utilized: Environmental consultant - 4 wheeled Level of assistance: SBA Comments: stepto pattern with RLE  leading and then LLE  TREATMENT DATE:  07/21/23 Nustep level 3 seat 6 UE/LE 5 minutes Standing:  slant board stretch 3X30   Heelraise on incline 20X  Toe raise on decline 20X  Hip abduction 2X10 each Aeromat balance beam tandem gait with CGA 1RT Aeromat balance beam sidestepping with min-modA 1RT [employer] leg press 3pl 10X   07/15/2023  Therapeutic Exercise: -Stationary bike, 3 minutes, 60 spm, level 1 resistance, pt states this wore her out -Supine Ankle AROM each direction 20 reps  -TKE, 6 inch step, 2 sets of 10 reps, BUE support, pt cued for decreased UE support -Aeromat walks, tandem stepping and lateral stepping, 1 lap each variation, no UE support for about 5 steps at a time -Leg press 1 set of 10 bilateral plate 3, 1 set of 10 LLE only plate 2 -Treadmill, 2 minutes, 30 seconds, 2.0 grade, pt cued for increased foot clearance and bigger strides -Sled push/pull, 40 lbs first lap, 20 lbs second lap   07/10/2023  Nustep for warm up x 7 minutes. Rocker board dorsi/plantar x 10 Slant board stretch 30 x 3  Tandem stance 30 with Rt in back then front x 2 Gastroc stretch 20 x 3 Red theraband exercises Dorsiflexion x 5 Inversion x 5 Eversion x5 Instruction on walking with equal strides.    PATIENT EDUCATION:  Education details: PT Evaluation, findings, prognosis, frequency, attendance policy, and HEP. Person educated: Patient Education method: Explanation and Demonstration Education comprehension: verbalized understanding  HOME EXERCISE PROGRAM: Access Code: [email or web address] 07/10/23 - Seated Ankle Alphabet  - 1 x daily - 7 x weekly - 1 sets - 1 reps - Ankle Dorsiflexion with Resistance  - 1 x daily - 7 x weekly - 1 sets - 10 reps - 5 hold - Seated Ankle Eversion with Resistance  - 1 x daily - 7 x weekly - 1 sets - 10 reps - 5 hold - Ankle  Inversion with Resistance  - 1 x daily - 7 x weekly - 1 sets - 10 reps - 5 hold - Gastroc Stretch on Wall  - 2 x daily - 7 x weekly - 1 sets - 3 reps - 30 hold  URL: [email or web address] Date: 05/16/2023 Prepared by: Omega Bottcher  Exercises - Seated Ankle Dorsiflexion Stretch  - 1 x daily - 7 x weekly - 3 sets - 10 reps - Seated Ankle Pumps  - 1 x daily - 7 x weekly - 3 sets - 20 reps - Seated Ankle Circles  - 1 x daily - 7 x weekly - 3 sets - 26 reps - Sit to Stand with Counter Support  - 1 x daily - 7 x weekly - 3 sets - 10 reps  ASSESSMENT:  CLINICAL IMPRESSION: Began on nustep and progressed to standing strengthening/ROM.  Noted tightness in gastroc mm with stretch.  Pt maintains forward bent posturing with standing exercises despite cues. Pt with extreme difficulty  maintaining balance with side stepping on balance beam.  Several incidences of leaning back into extension with inability to pull self forward to correct without mod assist.  Pt was able to maintain balance with tandem following cues to keep forward gaze, which improved stability. No pain or issues while completing therapy or at conclusion today.  Also noted reduced edema today. Continues to complete all mobility tasks slowly.    OBJECTIVE IMPAIRMENTS: Abnormal gait, decreased activity tolerance, decreased balance, decreased mobility, difficulty walking, decreased ROM, decreased strength, hypomobility, postural dysfunction, and pain.   ACTIVITY LIMITATIONS: carrying, lifting, bending,  standing, squatting, stairs, transfers, bed mobility, and locomotion level  PARTICIPATION LIMITATIONS: meal prep, driving, community activity, occupation, and yard work  PERSONAL FACTORS: Age and 1-2 comorbidities: Parkinson's are also affecting patient's functional outcome.   REHAB POTENTIAL: Good  CLINICAL DECISION MAKING: Stable/uncomplicated  EVALUATION COMPLEXITY: Low   GOALS: Goals reviewed with patient?  Yes  SHORT TERM GOALS: Target date: 07/11/23  Pt will be independent with HEP in order to demonstrate participation in Physical Therapy POC.  Baseline:  06/24/23:  Reports compliance with HEP. Goal status: MET  2.  Pt will 3/10 pain during mobility in order to demonstrate improved pain while performing ADLs.  Baseline: 06/24/23:  Pain scale average 2-8/10 Goal status: INITIAL  LONG TERM GOALS: Target date: 07/25/23  Pt will improve 30 Second Chair Stand Test by at least 2 reps in order to demonstrate improved functional strength to return to desired activities.  Baseline: see objective.; 06/24/23:  able to complete 8 times no HHA, was 5 initial eval. Goal status: MET  2.  Pt will improve TUG by at least 15 seconds in order to demonstrate improved functional ambulatory capacity in community setting.  Baseline: see objective. 07/14/23:  Improved to 24.07 was 47.96 seconds Goal status: MET  3.  Pt will improve LEFS score by 20% in order to demonstrate improved pain with functional goals and outcomes. Baseline: see objective. ; 06/24/23: 29/80=  36.3% was LEFS 21/80 = 26.3% Goal status: IN PROGRESS  4.  Pt will improve LLE ankle ROM by  at least 5 degrees in order to improve symmetry during functional activities.. Baseline: see objective.  Goal status: IN PROGRESS  5.  Pt will improve BLE MMT at least 1/2 grade  in order to improve endurance, balance and tolerance during functional activities.. Baseline: see objective.  Goal status: IN PROGRESS   PLAN:  PT FREQUENCY: 2x/week  PT DURATION: 4 weeks  PLANNED INTERVENTIONS: [id number]- PT Re-evaluation, 97750- Physical Performance Testing, 97110-Therapeutic exercises, 97530- Therapeutic activity, [id number]- Neuromuscular re-education, 97535- Self Care, [id number]- Manual therapy, [phone]- Gait training, [phone]- Electrical stimulation (unattended), [phone]- Electrical stimulation (manual), Patient/Family education, Balance training, Stair training, Joint  mobilization, Joint manipulation, Spinal manipulation, Spinal mobilization, DME instructions, Cryotherapy, and Moist heat  PLAN FOR NEXT SESSION: continue to improve functional ROM and strength of Lt ankle and gait with LRAD. Complete reassessment next session.   Greig KATHEE Fuse, PTA/CLT [facility] Ph: [phone] 1:54 PM, 07/21/23

## 2023-07-21 NOTE — Progress Notes (Signed)
 Orthopedic Surgery Post-operative Office Visit   Procedure: left distal tibia and fibula fractures open reduction internal fixation Date of Surgery: 02/02/2023 (~6 months post-op)   Assessment: Patient is a 73 y.o. who has developed lateral, proximal forefoot pain. Not having any ankle pain. Ambulating in regular shoes.      Plan: -No operative plans at this time -Weightbearing as tolerated in regular shoes -DVT ppx: none -Okay to submerge wounds -If her foot pain does not get better with the therapy, aleve , and voltaren  gel I told her to come back in sooner -Return to office in 6 months, x-rays needed at next visit: left AP and lateral tibia   ___________________________________________________________________________     Subjective: Patient has been weightbearing in regular shoes.  She says she does notice pain if she is walking for longer distances.  She feels the pain more in the foot than around the ankle.  She points to an area over the lateral midfoot.  She has been working with physical therapy and finds that helpful.  She has not tried any medication for her pain.  She said she has noticed this foot pain for the last month or so.   Objective:   General: no acute distress, appropriate affect Neurologic: alert, answering questions appropriately, following commands Respiratory: unlabored breathing on room air Skin: incisions are well healed with no erythema, induration, active/expressible drainage   MSK (LLE): ambulates in regular shoes with cane, ankle range of motion from 0-30, sensation intact to light touch in sural/saphenous/deep peroneal/superficial peroneal/tibial nerve distributions, foot warm and well-perfused, palpable DP pulse, EHL/TA/GSC intact  On exam of her foot, she has tenderness palpation over the base of the fifth metatarsal, no pain over the remainder of the foot, no swelling seen, no wounds seen   Imaging: XRs of the left tibia from 07/21/2023 were  independently reviewed and interpreted, showing subtle lucency around the distal locking screws in the tibia.  Possible subtle lucency around the distal fibula screws but not as well-visualized as on prior films.  There appears to be some interval bone formation at the tibia fracture.  Tibia and fibula fractures appear unchanged in terms of alignment.  Step-off seen on the lateral at the fibular fracture.  No new fracture seen.  XRs of the left foot from 07/21/2023 were independently reviewed and interpreted, showing no fracture or dislocation.  No significant degenerative changes seen.    Patient name: Kimberly Mccormick Patient MRN: [id number] Date of visit: 07/21/23   6 Month Post-operative Scores  VAS leg: 3/10  Pre-operative Scores   VAS leg: 9/10

## 2023-07-22 ENCOUNTER — Inpatient Hospital Stay: Attending: Adult Health | Admitting: Adult Health

## 2023-07-22 VITALS — BP 194/89 | HR 65 | Temp 98.0°F | Resp 17 | Ht 63.0 in | Wt 138.7 lb

## 2023-07-22 DIAGNOSIS — N644 Mastodynia: Secondary | ICD-10-CM | POA: Diagnosis not present

## 2023-07-22 DIAGNOSIS — E785 Hyperlipidemia, unspecified: Secondary | ICD-10-CM | POA: Diagnosis not present

## 2023-07-22 DIAGNOSIS — Z923 Personal history of irradiation: Secondary | ICD-10-CM | POA: Diagnosis not present

## 2023-07-22 DIAGNOSIS — Z803 Family history of malignant neoplasm of breast: Secondary | ICD-10-CM | POA: Diagnosis not present

## 2023-07-22 DIAGNOSIS — N6002 Solitary cyst of left breast: Secondary | ICD-10-CM | POA: Insufficient documentation

## 2023-07-22 DIAGNOSIS — I10 Essential (primary) hypertension: Secondary | ICD-10-CM | POA: Insufficient documentation

## 2023-07-22 DIAGNOSIS — Z170 Estrogen receptor positive status [ER+]: Secondary | ICD-10-CM

## 2023-07-22 DIAGNOSIS — C50212 Malignant neoplasm of upper-inner quadrant of left female breast: Secondary | ICD-10-CM

## 2023-07-22 DIAGNOSIS — Z9221 Personal history of antineoplastic chemotherapy: Secondary | ICD-10-CM | POA: Insufficient documentation

## 2023-07-22 DIAGNOSIS — N649 Disorder of breast, unspecified: Secondary | ICD-10-CM | POA: Insufficient documentation

## 2023-07-22 DIAGNOSIS — Z853 Personal history of malignant neoplasm of breast: Secondary | ICD-10-CM | POA: Insufficient documentation

## 2023-07-22 DIAGNOSIS — Z8673 Personal history of transient ischemic attack (TIA), and cerebral infarction without residual deficits: Secondary | ICD-10-CM | POA: Insufficient documentation

## 2023-07-22 NOTE — Progress Notes (Signed)
 [facility] Cancer Follow up:    Kimberly Elspeth BRAVO, MD [address] [place] [address]   DIAGNOSIS:  Cancer Staging  Malignant neoplasm of upper-inner quadrant of left breast in female, estrogen receptor positive (HCC) Staging form: Breast, AJCC 8th Edition - Pathologic: Stage IA (pT1c, pN0, cM0, G2, ER+, PR-, HER2-, Oncotype DX score: 30) - Signed by Crawford Morna Pickle, NP on 07/22/2023 Multigene prognostic tests performed: Oncotype DX Recurrence score range: Greater than or equal to 11 Histologic grading system: 3 grade system    SUMMARY OF ONCOLOGIC HISTORY: Oncology History  Malignant neoplasm of upper-inner quadrant of left breast in female, estrogen receptor positive (HCC)  03/24/2018 Initial Diagnosis   Screening detected left breast mass 8 mm upper inner quadrant biopsy revealed grade 2 IDC with DCIS ER 95%, PR 0%, Ki-67 20%, HER-2 -1+ by IHC, T1 BN 0 stage Ia clinical stage   05/06/2018 Surgery   Lumpectomy Warden): IDC with DCIS, 1.8cm, grade 2, ER+ (95%), PR-, HER2 negative (1+, IHC), Ki67 20%, clear margins, 4 SLN negative.    05/18/2018 Oncotype testing   Oncotype DX recurrence score 30: risk of distant recurrence at 9 years is 19%. Chemo benefit is >15%.    06/23/2018 -  Chemotherapy   Adjuvant chemotherapy with dose dense Adriamycin and Cytoxan  x4 followed by Taxol weekly x12   07/22/2023 Cancer Staging   Staging form: Breast, AJCC 8th Edition - Pathologic: Stage IA (pT1c, pN0, cM0, G2, ER+, PR-, HER2-, Oncotype DX score: 30) - Signed by Crawford Morna Pickle, NP on 07/22/2023 Multigene prognostic tests performed: Oncotype DX Recurrence score range: Greater than or equal to 11 Histologic grading system: 3 grade system     CURRENT THERAPY: observation  INTERVAL HISTORY:  Discussed the use of AI scribe software for clinical note transcription with the patient, who gave verbal consent to proceed.  History of Present Illness Kimberly Mccormick is a 73  year old female with a history of left breast cancer who presents with left breast discomfort.  She experiences discomfort in her left breast, specifically on the side, which is in a different location from a previously palpable cyst. Her medical history includes left breast cancer diagnosed in 2020, stage 1A, estrogen receptor-positive. She underwent lumpectomy, adjuvant chemotherapy, and radiation therapy, and opted to forego antiestrogen therapy. Her most recent mammogram showed no evidence of malignancy and a stable palpable cyst in the left breast with breast density category B.    Patient Active Problem List   Diagnosis Date Noted   Closed fracture of left fibula and tibia 02/02/2023   Acute diarrhea 12/23/2022   Functional dyspepsia 07/01/2022   DOE (dyspnea on exertion) 01/15/2022   Nausea with vomiting 05/28/2021   Duodenal adenoma 05/28/2021   Brain aneurysm 04/25/2021   History of herpes simplex infection 01/08/2021   Papanicolaou smear, as part of routine gynecological examination 01/08/2021   CVA (cerebral vascular accident) (HCC) 01/04/2021   Acute ischemic stroke (HCC) 01/03/2021   Hypokalemia 01/03/2021   Cerebral aneurysm 01/03/2021   Musculoskeletal pain 01/03/2021   Early satiety 11/27/2020   Chronic abdominal pain 07/13/2020   Malignant neoplasm of upper-inner quadrant of left breast in female, estrogen receptor positive (HCC) 04/16/2018   Essential hypertension 03/21/2015   Hyperlipidemia 03/21/2015   Genital herpes 03/21/2015   Fecal urgency 03/21/2015   GERD (gastroesophageal reflux disease) 03/21/2015    is allergic to penicillins, egg-derived products, and other.  MEDICAL HISTORY: Past Medical History:  Diagnosis Date   Aneurysm of left internal carotid artery    Small supraclinoid 5 mm   Breast cancer (HCC)    Left s/p lumpectomy (05/06/2018) and adjuvant chemotherapy (06/23/2018) with Adriamycin and Cytoxan  x4 followed by Taxol weekly x12   DDD  (degenerative disc disease), lumbar    Essential hypertension    Genital warts    GERD (gastroesophageal reflux disease)    History of anemia    History of renal insufficiency    Hypertension    Insomnia    Iron deficiency anemia    Ischemic stroke [facility])    December 2022   Mixed hyperlipidemia    Osteoarthritis    Parkinson's disease (HCC)    Peripheral neuropathy    Related to chemotherapy   Personal history of chemotherapy 2020   Port-A-Cath in place 06/23/2018    SURGICAL HISTORY: Past Surgical History:  Procedure Laterality Date   APPENDECTOMY  1966   BIOPSY  08/29/2020   Procedure: BIOPSY;  Surgeon: Eartha Angelia Sieving, MD;  [facility];  Service: Gastroenterology;;   BIOPSY  07/24/2022   Procedure: BIOPSY;  Surgeon: Eartha Angelia Sieving, MD;  [facility];  Service: Gastroenterology;;   BREAST LUMPECTOMY WITH RADIOACTIVE SEED AND SENTINEL LYMPH NODE BIOPSY Left 05/06/2018   Procedure: LEFT BREAST LUMPECTOMY WITH RADIOACTIVE SEED AND LEFT DEEP AXILLARY SENTINEL LYMPH NODE BIOPSY AND BLUE DYE INJECTION;  Surgeon: Gail Favorite, MD;  Location: [facility];  Service: General;  Laterality: Left;   CATARACT EXTRACTION W/PHACO Left 01/10/2014   Procedure: CATARACT EXTRACTION PHACO AND INTRAOCULAR LENS PLACEMENT ; CDE:  4.94;  Surgeon: Dow JULIANNA Burke, MD;  Location: [facility];  Service: Ophthalmology;  Laterality: Left;   CESAREAN SECTION  1986   CHOLECYSTECTOMY     COLONOSCOPY WITH PROPOFOL  N/A 07/25/2020   Procedure: COLONOSCOPY WITH PROPOFOL ;  Surgeon: Eartha Angelia Sieving, MD;  [facility];  Service: Gastroenterology;  Laterality: N/A;  10:55   ESOPHAGOGASTRODUODENOSCOPY (EGD) WITH PROPOFOL  N/A 08/29/2020   Procedure: ESOPHAGOGASTRODUODENOSCOPY (EGD) WITH PROPOFOL ;  Surgeon: Eartha Angelia Sieving, MD;  [facility];  Service: Gastroenterology;  Laterality: N/A;  12:00   ESOPHAGOGASTRODUODENOSCOPY (EGD)  WITH PROPOFOL  N/A 07/24/2022   Procedure: ESOPHAGOGASTRODUODENOSCOPY (EGD) WITH PROPOFOL ;  Surgeon: Eartha Angelia Sieving, MD;  [facility];  Service: Gastroenterology;  Laterality: N/A;  12:45;ASA 1-2   HOT HEMOSTASIS  07/24/2022   Procedure: HOT HEMOSTASIS (ARGON PLASMA COAGULATION/BICAP);  Surgeon: Eartha Angelia, Sieving, MD;  [facility];  Service: Gastroenterology;;   IR 3D INDEPENDENT WKST  04/25/2021   IR ANGIO INTRA EXTRACRAN SEL COM CAROTID INNOMINATE UNI R MOD SED  04/25/2021   IR ANGIO INTRA EXTRACRAN SEL INTERNAL CAROTID UNI L MOD SED  04/25/2021   IR ANGIOGRAM FOLLOW UP STUDY  04/25/2021   IR CT HEAD LTD  04/25/2021   IR NEURO EACH ADD'L AFTER BASIC UNI LEFT (MS)  04/25/2021   IR RADIOLOGIST EVAL & MGMT  03/22/2021   IR RADIOLOGIST EVAL & MGMT  03/28/2021   IR RADIOLOGIST EVAL & MGMT  05/15/2021   IR RADIOLOGIST EVAL & MGMT  09/28/2021   IR TRANSCATH/EMBOLIZ  04/25/2021   IR US  GUIDE VASC ACCESS RIGHT  04/25/2021   MYRINGOTOMY WITH TUBE PLACEMENT Right 02/04/2017   Procedure: REVISION OF RIGHT MYRINGOTOMY WITH TUBE PLACEMENT, WITH EXAM OF LEFT EAR;  Surgeon: Karis Clunes, MD;  Location: [facility];  Service: ENT;  Laterality: Right;  NASAL SINUS SURGERY  2016   polypectomy   OPEN REDUCTION INTERNAL FIXATION (ORIF) TIBIA/FIBULA FRACTURE Left 02/02/2023   Procedure: OPEN REDUCTION INTERNAL FIXATION (ORIF) LEFT TIBIA/FIBULA FRACTURE;  Surgeon: Georgina Ozell LABOR, MD;  Location: [facility];  Service: Orthopedics;  Laterality: Left;   POLYPECTOMY  07/25/2020   Procedure: POLYPECTOMY;  Surgeon: Eartha Angelia Sieving, MD;  [facility];  Service: Gastroenterology;;   POLYPECTOMY  08/29/2020   Procedure: POLYPECTOMY;  Surgeon: Eartha Angelia Sieving, MD;  [facility];  Service: Gastroenterology;;   POLYPECTOMY  07/24/2022   Procedure: POLYPECTOMY;  Surgeon: Eartha Angelia Sieving, MD;  [facility];  Service: Gastroenterology;;    [facility] REMOVAL N/A 01/06/2019   Procedure: REMOVAL PORT-A-CATH;  Surgeon: Gail Favorite, MD;  Location: [facility];  Service: General;  Laterality: N/A;   PORTACATH PLACEMENT Right 06/16/2018   Procedure: INSERTION PORT-A-CATH;  Surgeon: Gail Favorite, MD;  Location: [facility];  Service: General;  Laterality: Right;   RADIOLOGY WITH ANESTHESIA N/A 04/25/2021   Procedure: IR WITH ANESTHESIA EMBOLIZATION;  Surgeon: Dolphus Carrion, MD;  Location: [facility];  Service: Radiology;  Laterality: N/A;   SCLEROTHERAPY  07/24/2022   Procedure: SCLEROTHERAPY;  Surgeon: Eartha Angelia, Sieving, MD;  [facility];  Service: Gastroenterology;;   SUBMUCOSAL LIFTING INJECTION  08/29/2020   Procedure: SUBMUCOSAL LIFTING INJECTION;  Surgeon: Eartha Angelia, Sieving, MD;  [facility];  Service: Gastroenterology;;   SUBMUCOSAL LIFTING INJECTION  07/24/2022   Procedure: SUBMUCOSAL LIFTING INJECTION;  Surgeon: Eartha Angelia, Sieving, MD;  [facility];  Service: Gastroenterology;;   TONSILLECTOMY  1970    SOCIAL HISTORY: Social History   Socioeconomic History   Marital status: Legally Separated    Spouse name: Not on file   Number of children: 2   Years of education: Not on file   Highest education level: Not on file  Occupational History    Comment: retired  Tobacco Use   Smoking status: Never   Smokeless tobacco: Never  Vaping Use   Vaping status: Never Used  Substance and Sexual Activity   Alcohol use: No   Drug use: No   Sexual activity: Not Currently    Birth control/protection: Post-menopausal  Other Topics Concern   Not on file  Social History Narrative   One son lives with her   Social Drivers of Health   Financial Resource Strain: Low Risk  (01/08/2021)   Overall Financial Resource Strain (CARDIA)    Difficulty of Paying Living Expenses: Not hard at all  Food Insecurity: No Food Insecurity (02/02/2023)   Hunger  Vital Sign    Worried About Running Out of Food in the Last Year: Never true    Ran Out of Food in the Last Year: Never true  Transportation Needs: No Transportation Needs (02/02/2023)   PRAPARE - Administrator, Civil Service (Medical): No    Lack of Transportation (Non-Medical): No  Physical Activity: Inactive (01/08/2021)   Exercise Vital Sign    Days of Exercise per Week: 0 days    Minutes of Exercise per Session: 0 min  Stress: No Stress Concern Present (01/08/2021)   [employer] of Occupational Health - Occupational Stress Questionnaire    Feeling of Stress : Not at all  Social Connections: Moderately Integrated (02/02/2023)   Social Connection and Isolation Panel    Frequency of Communication with Friends and Family: More than three times a week  Frequency of Social Gatherings with Friends and Family: More than three times a week    Attends Religious Services: More than 4 times per year    Active Member of [employer] or Organizations: Yes    Attends Engineer, structural: More than 4 times per year    Marital Status: Divorced  Intimate Partner Violence: Not At Risk (02/02/2023)   Humiliation, Afraid, Rape, and Kick questionnaire    Fear of Current or Ex-Partner: No    Emotionally Abused: No    Physically Abused: No    Sexually Abused: No    FAMILY HISTORY: Family History  Problem Relation Age of Onset   Lupus Mother    Heart attack Father        age 54   Prostate cancer Father    Hypothyroidism Sister    Breast cancer Sister    Hypertension Sister    Breast cancer Sister    Hypertension Brother    Parkinson's disease Paternal Aunt    Stroke Neg Hx     Review of Systems  Constitutional:  Negative for appetite change, chills, fatigue, fever and unexpected weight change.  HENT:   Negative for hearing loss, lump/mass and trouble swallowing.   Eyes:  Negative for eye problems and icterus.  Respiratory:  Negative for chest tightness, cough and  shortness of breath.   Cardiovascular:  Negative for chest pain, leg swelling and palpitations.  Gastrointestinal:  Negative for abdominal distention, abdominal pain, constipation, diarrhea, nausea and vomiting.  Endocrine: Negative for hot flashes.  Genitourinary:  Negative for difficulty urinating.   Musculoskeletal:  Negative for arthralgias.  Skin:  Negative for itching and rash.  Neurological:  Negative for dizziness, extremity weakness, headaches and numbness.  Hematological:  Negative for adenopathy. Does not bruise/bleed easily.  Psychiatric/Behavioral:  Negative for depression. The patient is not nervous/anxious.       PHYSICAL EXAMINATION     Vitals:   07/22/23 1043  BP: (!) 194/89  Pulse: 65  Resp: 17  Temp: 98 F (36.7 C)  SpO2: 100%    Physical Exam Constitutional:      General: She is not in acute distress.    Appearance: Normal appearance. She is not toxic-appearing.  HENT:     Head: Normocephalic and atraumatic.     Mouth/Throat:     Mouth: Mucous membranes are moist.     Pharynx: Oropharynx is clear. No oropharyngeal exudate or posterior oropharyngeal erythema.  Eyes:     General: No scleral icterus. Cardiovascular:     Rate and Rhythm: Normal rate and regular rhythm.     Pulses: Normal pulses.     Heart sounds: Normal heart sounds.  Pulmonary:     Effort: Pulmonary effort is normal.     Breath sounds: Normal breath sounds.  Chest:     Comments: Left breast s/p lumpectomy & radiation, no sign of local recurrence, right breast benign Abdominal:     General: Abdomen is flat. Bowel sounds are normal. There is no distension.     Palpations: Abdomen is soft.     Tenderness: There is no abdominal tenderness.  Musculoskeletal:        General: No swelling.     Cervical back: Neck supple.  Lymphadenopathy:     Cervical: No cervical adenopathy.     Upper Body:     Right upper body: No supraclavicular or axillary adenopathy.     Left upper body: No  supraclavicular or axillary adenopathy.  Skin:  General: Skin is warm and dry.     Findings: No rash.  Neurological:     General: No focal deficit present.     Mental Status: She is alert.  Psychiatric:        Mood and Affect: Mood normal.        Behavior: Behavior normal.     LABORATORY DATA:  CBC    Component Value Date/Time   WBC 11.3 (H) 02/03/2023 0426   RBC 4.83 02/03/2023 0426   HGB 12.9 02/03/2023 0426   HGB 11.6 (L) 06/29/2020 1052   HCT 39.6 02/03/2023 0426   PLT 237 02/03/2023 0426   PLT 208 06/29/2020 1052   MCV 82.0 02/03/2023 0426   MCH 26.7 02/03/2023 0426   MCHC 32.6 02/03/2023 0426   RDW 13.1 02/03/2023 0426   LYMPHSABS 2.0 02/02/2023 1715   MONOABS 0.3 02/02/2023 1715   EOSABS 0.2 02/02/2023 1715   BASOSABS 0.1 02/02/2023 1715    CMP     Component Value Date/Time   NA 140 02/04/2023 0455   NA 144 12/20/2019 1104   K 4.2 02/04/2023 0455   CL 108 02/04/2023 0455   CO2 26 02/04/2023 0455   GLUCOSE 111 (H) 02/04/2023 0455   BUN 19 02/04/2023 0455   BUN 9 12/20/2019 1104   CREATININE 0.90 02/04/2023 0455   CREATININE 0.82 06/29/2020 1052   CALCIUM  8.6 (L) 02/04/2023 0455   PROT 6.1 (L) 02/02/2023 1715   PROT 6.9 12/20/2019 1104   ALBUMIN 3.3 (L) 02/02/2023 1715   ALBUMIN 4.1 12/20/2019 1104   AST 20 02/02/2023 1715   AST 18 06/29/2020 1052   ALT 11 02/02/2023 1715   ALT 13 06/29/2020 1052   ALKPHOS 74 02/02/2023 1715   BILITOT 0.7 02/02/2023 1715   BILITOT 1.0 06/29/2020 1052   GFRNONAA >60 02/04/2023 0455   GFRNONAA >60 06/29/2020 1052   GFRAA 80 12/20/2019 1104   GFRAA >60 02/11/2019 1034     ASSESSMENT and THERAPY PLAN:   Malignant neoplasm of upper-inner quadrant of left breast in female, estrogen receptor positive (HCC) 05/08/2018 lumpectomy Warden): IDC with DCIS, 1.8cm, grade 2, ER+ (95%), PR-, HER2 negative (1+, IHC), Ki67 20%, clear margins, 4 SLN negative. Oncotype DX score 30: Distant recurrence risk at 9 years  19% Treatment plan: 1.  Adjuvant chemotherapy with Taxotere  and Cytoxan  x4 (patient refused) 2.  Adjuvant radiation therapy at [facility] 3.  Followed by adjuvant antiestrogen therapy: (Patient undecided) ----------------------------------------------------------------------------------------------------------------------------------  CT chest abdomen pelvis 02/11/2019: No evidence of metastatic disease.   December 2022: Stroke Hospitalization 04/25/2021-04/27/2021 left internal carotid artery aneurysm embolization   Assessment and Plan Assessment & Plan Left breast cancer Stage 1A, estrogen positive, post lumpectomy, chemotherapy, and radiation. Focal pain and tenderness noted, differential includes non-palpable pathology. - Order mammogram of left breast. - Order ultrasound of left breast.  Hypertension Elevated blood pressure at 194 systolic, missed medication dose. -Recommended adherence to anti-hypertensive -F/u with PCP if elevated SBP persists.  RTC in 2-3 weeks for f/u.    All questions were answered. The patient knows to call the clinic with any problems, questions or concerns. We can certainly see the patient much sooner if necessary.  Total encounter time:20 minutes*in face-to-face visit time, chart review, lab review, care coordination, order entry, and documentation of the encounter time.    Morna Kendall, NP 07/28/23 8:44 AM Medical Oncology and Hematology [facility] [address] [place], [place] [address] Tel. [phone]    Fax. [phone]  *  Total Encounter Time as defined by the Centers for Medicare and Medicaid Services includes, in addition to the face-to-face time of a patient visit (documented in the note above) non-face-to-face time: obtaining and reviewing outside history, ordering and reviewing medications, tests or procedures, care coordination (communications with other health care professionals or caregivers) and documentation in the medical  record.

## 2023-07-24 ENCOUNTER — Ambulatory Visit (HOSPITAL_COMMUNITY): Attending: Adult Health

## 2023-07-24 ENCOUNTER — Encounter (HOSPITAL_COMMUNITY): Payer: Self-pay

## 2023-07-24 DIAGNOSIS — S82202D Unspecified fracture of shaft of left tibia, subsequent encounter for closed fracture with routine healing: Secondary | ICD-10-CM | POA: Diagnosis not present

## 2023-07-24 DIAGNOSIS — M6281 Muscle weakness (generalized): Secondary | ICD-10-CM | POA: Diagnosis not present

## 2023-07-24 DIAGNOSIS — Z7409 Other reduced mobility: Secondary | ICD-10-CM | POA: Insufficient documentation

## 2023-07-24 DIAGNOSIS — S82402D Unspecified fracture of shaft of left fibula, subsequent encounter for closed fracture with routine healing: Secondary | ICD-10-CM | POA: Diagnosis not present

## 2023-07-24 DIAGNOSIS — R2681 Unsteadiness on feet: Secondary | ICD-10-CM | POA: Diagnosis not present

## 2023-07-24 NOTE — Therapy (Signed)
 OUTPATIENT PHYSICAL THERAPY LOWER EXTREMITY TREATMENT/PROGRESS NOTE  Progress Note Reporting Period 06/28/23 to 07/24/23  See note below for Objective Data and Assessment of Progress/Goals.     Patient Name: Kimberly Mccormick MRN: [id number] DOB:20-Apr-1950, 73 y.o., female Today's Date: 07/24/2023  END OF SESSION:  PT End of Session - 07/24/23 1259     Visit Number 17    Number of Visits 18    Date for PT Re-Evaluation 07/25/23    Authorization Type [employer] Dual Complete    Authorization Time Period no auth; no limit    Progress Note Due on Visit 18    PT Start Time 1300    PT Stop Time 1340    PT Time Calculation (min) 40 min    Equipment Utilized During Treatment Gait belt    Activity Tolerance Patient tolerated treatment well    Behavior During Therapy WFL for tasks assessed/performed                   Past Medical History:  Diagnosis Date   Aneurysm of left internal carotid artery    Small supraclinoid 5 mm   Breast cancer (HCC)    Left s/p lumpectomy (05/06/2018) and adjuvant chemotherapy (06/23/2018) with Adriamycin and Cytoxan  x4 followed by Taxol weekly x12   DDD (degenerative disc disease), lumbar    Essential hypertension    Genital warts    GERD (gastroesophageal reflux disease)    History of anemia    History of renal insufficiency    Hypertension    Insomnia    Iron deficiency anemia    Ischemic stroke [facility])    December 2022   Mixed hyperlipidemia    Osteoarthritis    Parkinson's disease (HCC)    Peripheral neuropathy    Related to chemotherapy   Personal history of chemotherapy 2020   Past Surgical History:  Procedure Laterality Date   APPENDECTOMY  1966   BIOPSY  08/29/2020   Procedure: BIOPSY;  Surgeon: Eartha Angelia Sieving, MD;  [facility];  Service: Gastroenterology;;   BIOPSY  07/24/2022   Procedure: BIOPSY;  Surgeon: Eartha Angelia Sieving, MD;  [facility];  Service: Gastroenterology;;   BREAST LUMPECTOMY  WITH RADIOACTIVE SEED AND SENTINEL LYMPH NODE BIOPSY Left 05/06/2018   Procedure: LEFT BREAST LUMPECTOMY WITH RADIOACTIVE SEED AND LEFT DEEP AXILLARY SENTINEL LYMPH NODE BIOPSY AND BLUE DYE INJECTION;  Surgeon: Gail Favorite, MD;  Location: [facility];  Service: General;  Laterality: Left;   CATARACT EXTRACTION W/PHACO Left 01/10/2014   Procedure: CATARACT EXTRACTION PHACO AND INTRAOCULAR LENS PLACEMENT ; CDE:  4.94;  Surgeon: Dow JULIANNA Burke, MD;  Location: [facility];  Service: Ophthalmology;  Laterality: Left;   CESAREAN SECTION  1986   CHOLECYSTECTOMY     COLONOSCOPY WITH PROPOFOL  N/A 07/25/2020   Procedure: COLONOSCOPY WITH PROPOFOL ;  Surgeon: Eartha Angelia Sieving, MD;  [facility];  Service: Gastroenterology;  Laterality: N/A;  10:55   ESOPHAGOGASTRODUODENOSCOPY (EGD) WITH PROPOFOL  N/A 08/29/2020   Procedure: ESOPHAGOGASTRODUODENOSCOPY (EGD) WITH PROPOFOL ;  Surgeon: Eartha Angelia Sieving, MD;  [facility];  Service: Gastroenterology;  Laterality: N/A;  12:00   ESOPHAGOGASTRODUODENOSCOPY (EGD) WITH PROPOFOL  N/A 07/24/2022   Procedure: ESOPHAGOGASTRODUODENOSCOPY (EGD) WITH PROPOFOL ;  Surgeon: Eartha Angelia Sieving, MD;  [facility];  Service: Gastroenterology;  Laterality: N/A;  12:45;ASA 1-2   HOT HEMOSTASIS  07/24/2022   Procedure: HOT HEMOSTASIS (ARGON PLASMA COAGULATION/BICAP);  Surgeon: Eartha Angelia, Sieving, MD;  [facility];  Service: Gastroenterology;;   IR 3D INDEPENDENT WKST  04/25/2021   IR ANGIO INTRA EXTRACRAN SEL COM CAROTID INNOMINATE UNI R MOD SED  04/25/2021   IR ANGIO INTRA EXTRACRAN SEL INTERNAL CAROTID UNI L MOD SED  04/25/2021   IR ANGIOGRAM FOLLOW UP STUDY  04/25/2021   IR CT HEAD LTD  04/25/2021   IR NEURO EACH ADD'L AFTER BASIC UNI LEFT (MS)  04/25/2021   IR RADIOLOGIST EVAL & MGMT  03/22/2021   IR RADIOLOGIST EVAL & MGMT  03/28/2021   IR RADIOLOGIST EVAL & MGMT  05/15/2021   IR RADIOLOGIST EVAL & MGMT  09/28/2021    IR TRANSCATH/EMBOLIZ  04/25/2021   IR US  GUIDE VASC ACCESS RIGHT  04/25/2021   MYRINGOTOMY WITH TUBE PLACEMENT Right 02/04/2017   Procedure: REVISION OF RIGHT MYRINGOTOMY WITH TUBE PLACEMENT, WITH EXAM OF LEFT EAR;  Surgeon: Karis Clunes, MD;  Location:  [facility];  Service: ENT;  Laterality: Right;   NASAL SINUS SURGERY  2016   polypectomy   OPEN REDUCTION INTERNAL FIXATION (ORIF) TIBIA/FIBULA FRACTURE Left 02/02/2023   Procedure: OPEN REDUCTION INTERNAL FIXATION (ORIF) LEFT TIBIA/FIBULA FRACTURE;  Surgeon: Georgina Ozell LABOR, MD;  Location: [facility];  Service: Orthopedics;  Laterality: Left;   POLYPECTOMY  07/25/2020   Procedure: POLYPECTOMY;  Surgeon: Eartha Angelia Sieving, MD;  [facility];  Service: Gastroenterology;;   POLYPECTOMY  08/29/2020   Procedure: POLYPECTOMY;  Surgeon: Eartha Angelia Sieving, MD;  [facility];  Service: Gastroenterology;;   POLYPECTOMY  07/24/2022   Procedure: POLYPECTOMY;  Surgeon: Eartha Angelia Sieving, MD;  [facility];  Service: Gastroenterology;;   [facility] REMOVAL N/A 01/06/2019   Procedure: REMOVAL PORT-A-CATH;  Surgeon: Gail Favorite, MD;  Location:  SUR[facility]ERY [facility];  Service: General;  Laterality: N/A;   PORTACATH PLACEMENT Right 06/16/2018   Procedure: INSERTION PORT-A-CATH;  Surgeon: Gail Favorite, MD;  Location:  SUR[facility]ERY [facility];  Service: General;  Laterality: Right;   RADIOLOGY WITH ANESTHESIA N/A 04/25/2021   Procedure: IR WITH ANESTHESIA EMBOLIZATION;  Surgeon: Dolphus Carrion, MD;  Location: [facility];  Service: Radiology;  Laterality: N/A;   SCLEROTHERAPY  07/24/2022   Procedure: SCLEROTHERAPY;  Surgeon: Eartha Angelia, Sieving, MD;  [facility];  Service: Gastroenterology;;   SUBMUCOSAL LIFTING INJECTION  08/29/2020   Procedure: SUBMUCOSAL LIFTING INJECTION;  Surgeon: Eartha Angelia, Sieving, MD;  [facility];  Service: Gastroenterology;;   SUBMUCOSAL  LIFTING INJECTION  07/24/2022   Procedure: SUBMUCOSAL LIFTING INJECTION;  Surgeon: Eartha Angelia Sieving, MD;  [facility];  Service: Gastroenterology;;   TONSILLECTOMY  1970   Patient Active Problem List   Diagnosis Date Noted   Closed fracture of left fibula and tibia 02/02/2023   Acute diarrhea 12/23/2022   Functional dyspepsia 07/01/2022   DOE (dyspnea on exertion) 01/15/2022   Nausea with vomiting 05/28/2021   Duodenal adenoma 05/28/2021   Brain aneurysm 04/25/2021   History of herpes simplex infection 01/08/2021   Papanicolaou smear, as part of routine gynecological examination 01/08/2021   CVA (cerebral vascular accident) (HCC) 01/04/2021   Acute ischemic stroke (HCC) 01/03/2021   Hypokalemia 01/03/2021   Cerebral aneurysm 01/03/2021   Musculoskeletal pain 01/03/2021   Early satiety 11/27/2020   Chronic abdominal pain 07/13/2020   Port-A-Cath in place 06/23/2018   Malignant neoplasm of upper-inner quadrant of left breast in female, estrogen receptor positive (HCC) 04/16/2018   Essential hypertension 03/21/2015   Hyperlipidemia 03/21/2015   Genital  herpes 03/21/2015   Fecal urgency 03/21/2015   GERD (gastroesophageal reflux disease) 03/21/2015    PCP: [name], MD  REFERRING PROVIDER: Georgina Ozell LABOR, MD  REFERRING DIAG: S82.202D,S82.402D (ICD-10-CM) - Closed fracture of left tibia and fibula with routine healing, subsequent encounter   THERAPY DIAG:  Muscle weakness (generalized)  Closed fracture of left tibia and fibula with routine healing, subsequent encounter  Unsteadiness on feet  Impaired functional mobility, balance, gait, and endurance  Rationale for Evaluation and Treatment: Rehabilitation  ONSET DATE: 02/02/2023  SUBJECTIVE:   SUBJECTIVE STATEMENT: Pt states she thinks she still needs a little more therapy. Pt states she would like to continue therapy to work on balance while walking and increasing walking speed. Pt states  she went to the doctor about L foot and pt states the doctor said she was doing okay but will return for more Xrays as lateral aspect of foot is still tender.  Pain rating today is 2/10.  PERTINENT HISTORY: Closed Fx of Left tibia and fibula Parkinson's Disease without Dyskinesia, without mentation of fluctioantios Cerebral Aneurysm PAIN:  Are you having pain? No and Yes: NPRS scale: 4 Pain location: Lt ankle  Pain description: throb Aggravating factors: activitiy  Relieving factors: rest  PRECAUTIONS: Left CAM Boot-can transition to regular after a month in the boot  RED FLAGS: None   WEIGHT BEARING RESTRICTIONS: WBAT on LLE  FALLS:  Has patient fallen in last 6 months? Fall is MOI   PATIENT GOALS: To be able to walk on my own with AD  NEXT MD VISIT: in 3 months  OBJECTIVE:  Note: Objective measures were completed at Evaluation unless otherwise noted.  DIAGNOSTIC FINDINGS:   PATIENT SURVEYS:  LEFS 21/80 = 26.3%  06/24/23: 29/80=  36.3% COGNITION: Overall cognitive status: Within functional limits for tasks assessed     SENSATION: WFL  POSTURE: rounded shoulders and forward head  LOWER EXTREMITY ROM:  Active ROM Right eval Left eval Left 06/24/23: Left 07/24/23  Hip flexion      Hip extension      Hip abduction      Hip adduction      Hip internal rotation      Hip external rotation      Knee flexion      Knee extension      Ankle dorsiflexion 15 A5  P8 A8 I P12 10  Ankle plantarflexion   A 8  30  Ankle inversion WFL A2  P15 20 10  Ankle eversion WFL A10 20 A12 I P 20 11   (Blank rows = not tested)  LOWER EXTREMITY MMT:  MMT Right eval Left eval Right  06/24/23 Left 06/24/23 Right 07/24/23 Left 07/24/23  Hip flexion        Hip extension        Hip abduction        Hip adduction        Hip internal rotation        Hip external rotation        Knee flexion        Knee extension 3+ 4+ 4+ 4 4+ 4, pain  Ankle dorsiflexion 2 4 4  3- 4+ 4-, pain   Ankle plantarflexion 2+ 4 4 3  4+ 4-  Ankle inversion 2+ 4 4 3  4+ 3  Ankle eversion 2+ 4 4 3- 4+ 3   (Blank rows = not tested)   FUNCTIONAL TESTS:  30 Second Chair Stand Test: 5x  Norms:  Age 81-64 50-69 21-74 75-79 80-84 85-89 90-94  Women 15 15 14 13 12 11 9   Men 17 16 15 14 13 11 9    TUG: 47.96 seconds   06/24/23: 140ft with SPC in shoe 30 Second Chair Stand Test: 8x no HHA TUG: 24.07 GAIT: Distance walked: 26ft Assistive device utilized: Environmental consultant - 4 wheeled Level of assistance: SBA Comments: stepto pattern with RLE leading and then LLE                                                                                                                                TREATMENT DATE:  07/24/2023  Progress note: Strength assessed, ROM assessed, LEFs, pt education on HEP compliance Therapeutic Exercise: -Speed step ups, 5.5 reps first set, 6 reps second set, 30 second bouts, pt cued for increased speed, requires occasional UE support, 8 inch step -Walking bout with 3lb ankle weights 220 feet, 2 40 foot segments of speed walking, pt requests to walk without the SPC halfway     07/21/23 Nustep level 3 seat 6 UE/LE 5 minutes Standing:  slant board stretch 3X30   Heelraise on incline 20X  Toe raise on decline 20X  Hip abduction 2X10 each Aeromat balance beam tandem gait with CGA 1RT Aeromat balance beam sidestepping with min-modA 1RT [employer]craft leg press 3pl 10X   07/15/2023  Therapeutic Exercise: -Stationary bike, 3 minutes, 60 spm, level 1 resistance, pt states this wore her out -Supine Ankle AROM each direction 20 reps  -TKE, 6 inch step, 2 sets of 10 reps, BUE support, pt cued for decreased UE support -Aeromat walks, tandem stepping and lateral stepping, 1 lap each variation, no UE support for about 5 steps at a time -Leg press 1 set of 10 bilateral plate 3, 1 set of 10 LLE only plate 2 -Treadmill, 2 minutes, 30 seconds, 2.0 grade, pt cued for increased foot  clearance and bigger strides -Sled push/pull, 40 lbs first lap, 20 lbs second lap   PATIENT EDUCATION:  Education details: PT Evaluation, findings, prognosis, frequency, attendance policy, and HEP. Person educated: Patient Education method: Explanation and Demonstration Education comprehension: verbalized understanding  HOME EXERCISE PROGRAM: Access Code: [email or web address] 07/10/23 - Seated Ankle Alphabet  - 1 x daily - 7 x weekly - 1 sets - 1 reps - Ankle Dorsiflexion with Resistance  - 1 x daily - 7 x weekly - 1 sets - 10 reps - 5 hold - Seated Ankle Eversion with Resistance  - 1 x daily - 7 x weekly - 1 sets - 10 reps - 5 hold - Ankle Inversion with Resistance  - 1 x daily - 7 x weekly - 1 sets - 10 reps - 5 hold - Gastroc Stretch on Wall  - 2 x daily - 7 x weekly - 1 sets - 3 reps - 30 hold  URL: [email or web address] Date: 05/16/2023 Prepared by: Omega Bottcher  Exercises - Seated Ankle Dorsiflexion Stretch  -  1 x daily - 7 x weekly - 3 sets - 10 reps - Seated Ankle Pumps  - 1 x daily - 7 x weekly - 3 sets - 20 reps - Seated Ankle Circles  - 1 x daily - 7 x weekly - 3 sets - 26 reps - Sit to Stand with Counter Support  - 1 x daily - 7 x weekly - 3 sets - 10 reps  ASSESSMENT:  CLINICAL IMPRESSION: Patient continues to demonstrate decreased LLE strength and decreased L ankle ROM, decreased gait quality and balance. Patient also demonstrates decreased endurance of LE strength with aerobic based exercise during today's session. Patient able to demonstrate meeting 4/7 goals so far. Patient would benefit from additional 3 weeks with 2 visits per week for increased endurance with ambulation, increased LE strength, and improved balance for improved quality of life, improved independence with gait training and continued progress towards remaining therapy goals.    OBJECTIVE IMPAIRMENTS: Abnormal gait, decreased activity tolerance, decreased balance, decreased mobility,  difficulty walking, decreased ROM, decreased strength, hypomobility, postural dysfunction, and pain.   ACTIVITY LIMITATIONS: carrying, lifting, bending, standing, squatting, stairs, transfers, bed mobility, and locomotion level  PARTICIPATION LIMITATIONS: meal prep, driving, community activity, occupation, and yard work  PERSONAL FACTORS: Age and 1-2 comorbidities: Parkinson's are also affecting patient's functional outcome.   REHAB POTENTIAL: Good  CLINICAL DECISION MAKING: Stable/uncomplicated  EVALUATION COMPLEXITY: Low   GOALS: Goals reviewed with patient? Yes  SHORT TERM GOALS: Target date: 07/11/23  Pt will be independent with HEP in order to demonstrate participation in Physical Therapy POC.  Baseline:  06/24/23:  Reports compliance with HEP. Goal status: MET  2.  Pt will 3/10 pain during mobility in order to demonstrate improved pain while performing ADLs.  Baseline: 06/24/23:  Pain scale average 2-8/10 Goal status: MET  LONG TERM GOALS: Target date: 07/25/23  Pt will improve 30 Second Chair Stand Test by at least 2 reps in order to demonstrate improved functional strength to return to desired activities.  Baseline: see objective.; 06/24/23:  able to complete 8 times no HHA, was 5 initial eval. Goal status: MET  2.  Pt will improve TUG by at least 15 seconds in order to demonstrate improved functional ambulatory capacity in community setting.  Baseline: see objective. 07/14/23:  Improved to 24.07 was 47.96 seconds Goal status: MET  3.  Pt will improve LEFS score by 20% in order to demonstrate improved pain with functional goals and outcomes. Baseline: see objective. ; 07/24/23: 38/80;  06/24/23: 29/80=  36.3% was LEFS 21/80 = 26.3% Goal status: IN PROGRESS  4.  Pt will improve LLE ankle ROM by  at least 5 degrees in order to improve symmetry during functional activities.. Baseline: see objective.  Goal status: IN PROGRESS  5.  Pt will improve BLE MMT at least 1/2 grade  in  order to improve endurance, balance and tolerance during functional activities.. Baseline: see objective.  Goal status: IN PROGRESS   PLAN:  PT FREQUENCY: 2x/week  PT DURATION: 3 weeks  PLANNED INTERVENTIONS: [id number]- PT Re-evaluation, 97750- Physical Performance Testing, 97110-Therapeutic exercises, 97530- Therapeutic activity, [id number]- Neuromuscular re-education, 97535- Self Care, [id number]- Manual therapy, [phone]- Gait training, [phone]- Electrical stimulation (unattended), [phone]- Electrical stimulation (manual), Patient/Family education, Balance training, Stair training, Joint mobilization, Joint manipulation, Spinal manipulation, Spinal mobilization, DME instructions, Cryotherapy, and Moist heat  PLAN FOR NEXT SESSION: Extend for 3 more weeks for 2 more visits a week to address ROM, LLE strength, and LEFS score.  Pt would like to continue improving walking balance and walking speed.  Cannan Beeck, PT, DPT [facility] Office: [phone] 1:45 PM, 07/24/23

## 2023-07-28 ENCOUNTER — Encounter: Payer: Self-pay | Admitting: Adult Health

## 2023-07-28 NOTE — Assessment & Plan Note (Signed)
 05/08/2018 lumpectomy [facility]): IDC with DCIS, 1.8cm, grade 2, ER+ (95%), PR-, HER2 negative (1+, IHC), Ki67 20%, clear margins, 4 SLN negative. Oncotype DX score 30: Distant recurrence risk at 9 years 19% Treatment plan: 1.  Adjuvant chemotherapy with Taxotere  and Cytoxan  x4 (patient refused) 2.  Adjuvant radiation therapy at [facility] 3.  Followed by adjuvant antiestrogen therapy: (Patient undecided) ----------------------------------------------------------------------------------------------------------------------------------  CT chest abdomen pelvis 02/11/2019: No evidence of metastatic disease.   December 2022: Stroke Hospitalization 04/25/2021-04/27/2021 left internal carotid artery aneurysm embolization   Assessment and Plan Assessment & Plan Left breast cancer Stage 1A, estrogen positive, post lumpectomy, chemotherapy, and radiation. Focal pain and tenderness noted, differential includes non-palpable pathology. - Order mammogram of left breast. - Order ultrasound of left breast.  Hypertension Elevated blood pressure at 194 systolic, missed medication dose. -Recommended adherence to anti-hypertensive -F/u with PCP if elevated SBP persists.  RTC in 2-3 weeks for f/u.

## 2023-07-29 ENCOUNTER — Encounter (HOSPITAL_COMMUNITY): Payer: Self-pay

## 2023-07-29 ENCOUNTER — Ambulatory Visit (HOSPITAL_COMMUNITY)

## 2023-07-29 DIAGNOSIS — S82202D Unspecified fracture of shaft of left tibia, subsequent encounter for closed fracture with routine healing: Secondary | ICD-10-CM | POA: Diagnosis not present

## 2023-07-29 DIAGNOSIS — R2681 Unsteadiness on feet: Secondary | ICD-10-CM

## 2023-07-29 DIAGNOSIS — S82402D Unspecified fracture of shaft of left fibula, subsequent encounter for closed fracture with routine healing: Secondary | ICD-10-CM | POA: Diagnosis not present

## 2023-07-29 DIAGNOSIS — M6281 Muscle weakness (generalized): Secondary | ICD-10-CM | POA: Diagnosis not present

## 2023-07-29 DIAGNOSIS — Z7409 Other reduced mobility: Secondary | ICD-10-CM

## 2023-07-29 NOTE — Therapy (Unsigned)
 OUTPATIENT PHYSICAL THERAPY LOWER EXTREMITY TREATMENT/PROGRESS NOTE  Progress Note Reporting Period 06/28/23 to 07/24/23  See note below for Objective Data and Assessment of Progress/Goals.     Patient Name: Kimberly Mccormick MRN: [id number] DOB:November 16, 1950, 73 y.o., female Today's Date: 07/29/2023  END OF SESSION:  PT End of Session - 07/29/23 1526     Visit Number 18    Number of Visits 24    Date for PT Re-Evaluation 08/14/23    Authorization Type [employer] Dual Complete    Authorization Time Period no auth; no limit    Progress Note Due on Visit 23    PT Start Time 1520    Activity Tolerance Patient tolerated treatment well    Behavior During Therapy WFL for tasks assessed/performed                   Past Medical History:  Diagnosis Date   Aneurysm of left internal carotid artery    Small supraclinoid 5 mm   Breast cancer (HCC)    Left s/p lumpectomy (05/06/2018) and adjuvant chemotherapy (06/23/2018) with Adriamycin and Cytoxan  x4 followed by Taxol weekly x12   DDD (degenerative disc disease), lumbar    Essential hypertension    Genital warts    GERD (gastroesophageal reflux disease)    History of anemia    History of renal insufficiency    Hypertension    Insomnia    Iron deficiency anemia    Ischemic stroke [facility])    December 2022   Mixed hyperlipidemia    Osteoarthritis    Parkinson's disease (HCC)    Peripheral neuropathy    Related to chemotherapy   Personal history of chemotherapy 2020   Port-A-Cath in place 06/23/2018   Past Surgical History:  Procedure Laterality Date   APPENDECTOMY  1966   BIOPSY  08/29/2020   Procedure: BIOPSY;  Surgeon: Eartha Angelia Sieving, MD;  [facility]: [facility];  Service: Gastroenterology;;   BIOPSY  07/24/2022   Procedure: BIOPSY;  Surgeon: Eartha Angelia Sieving, MD;  [facility]: [facility];  Service: Gastroenterology;;   BREAST LUMPECTOMY WITH RADIOACTIVE SEED AND SENTINEL LYMPH NODE BIOPSY Left 05/06/2018    Procedure: LEFT BREAST LUMPECTOMY WITH RADIOACTIVE SEED AND LEFT DEEP AXILLARY SENTINEL LYMPH NODE BIOPSY AND BLUE DYE INJECTION;  Surgeon: Gail Favorite, MD;  Location: [facility];  Service: General;  Laterality: Left;   CATARACT EXTRACTION W/PHACO Left 01/10/2014   Procedure: CATARACT EXTRACTION PHACO AND INTRAOCULAR LENS PLACEMENT ; CDE:  4.94;  Surgeon: Dow JULIANNA Burke, MD;  Location: [facility];  Service: Ophthalmology;  Laterality: Left;   CESAREAN SECTION  1986   CHOLECYSTECTOMY     COLONOSCOPY WITH PROPOFOL  N/A 07/25/2020   Procedure: COLONOSCOPY WITH PROPOFOL ;  Surgeon: Eartha Angelia Sieving, MD;  [facility];  Service: Gastroenterology;  Laterality: N/A;  10:55   ESOPHAGOGASTRODUODENOSCOPY (EGD) WITH PROPOFOL  N/A 08/29/2020   Procedure: ESOPHAGOGASTRODUODENOSCOPY (EGD) WITH PROPOFOL ;  Surgeon: Eartha Angelia Sieving, MD;  [facility];  Service: Gastroenterology;  Laterality: N/A;  12:00   ESOPHAGOGASTRODUODENOSCOPY (EGD) WITH PROPOFOL  N/A 07/24/2022   Procedure: ESOPHAGOGASTRODUODENOSCOPY (EGD) WITH PROPOFOL ;  Surgeon: Eartha Angelia Sieving, MD;  [facility];  Service: Gastroenterology;  Laterality: N/A;  12:45;ASA 1-2   HOT HEMOSTASIS  07/24/2022   Procedure: HOT HEMOSTASIS (ARGON PLASMA COAGULATION/BICAP);  Surgeon: Eartha Angelia, Sieving, MD;  [facility];  Service: Gastroenterology;;   IR 3D INDEPENDENT WKST  04/25/2021   IR ANGIO INTRA EXTRACRAN  SEL COM CAROTID INNOMINATE UNI R MOD SED  04/25/2021   IR ANGIO INTRA EXTRACRAN SEL INTERNAL CAROTID UNI L MOD SED  04/25/2021   IR ANGIOGRAM FOLLOW UP STUDY  04/25/2021   IR CT HEAD LTD  04/25/2021   IR NEURO EACH ADD'L AFTER BASIC UNI LEFT (MS)  04/25/2021   IR RADIOLOGIST EVAL & MGMT  03/22/2021   IR RADIOLOGIST EVAL & MGMT  03/28/2021   IR RADIOLOGIST EVAL & MGMT  05/15/2021   IR RADIOLOGIST EVAL & MGMT  09/28/2021   IR TRANSCATH/EMBOLIZ  04/25/2021   IR US  GUIDE VASC ACCESS RIGHT   04/25/2021   MYRINGOTOMY WITH TUBE PLACEMENT Right 02/04/2017   Procedure: REVISION OF RIGHT MYRINGOTOMY WITH TUBE PLACEMENT, WITH EXAM OF LEFT EAR;  Surgeon: Karis Clunes, MD;  Location: [facility];  Service: ENT;  Laterality: Right;   NASAL SINUS SURGERY  2016   polypectomy   OPEN REDUCTION INTERNAL FIXATION (ORIF) TIBIA/FIBULA FRACTURE Left 02/02/2023   Procedure: OPEN REDUCTION INTERNAL FIXATION (ORIF) LEFT TIBIA/FIBULA FRACTURE;  Surgeon: Georgina Ozell LABOR, MD;  Location: [facility];  Service: Orthopedics;  Laterality: Left;   POLYPECTOMY  07/25/2020   Procedure: POLYPECTOMY;  Surgeon: Eartha Angelia Sieving, MD;  [facility];  Service: Gastroenterology;;   POLYPECTOMY  08/29/2020   Procedure: POLYPECTOMY;  Surgeon: Eartha Angelia Sieving, MD;  [facility];  Service: Gastroenterology;;   POLYPECTOMY  07/24/2022   Procedure: POLYPECTOMY;  Surgeon: Eartha Angelia Sieving, MD;  [facility];  Service: Gastroenterology;;   [facility] REMOVAL N/A 01/06/2019   Procedure: REMOVAL PORT-A-CATH;  Surgeon: Gail Favorite, MD;  Location: [facility];  Service: General;  Laterality: N/A;   PORTACATH PLACEMENT Right 06/16/2018   Procedure: INSERTION PORT-A-CATH;  Surgeon: Gail Favorite, MD;  Location: [facility];  Service: General;  Laterality: Right;   RADIOLOGY WITH ANESTHESIA N/A 04/25/2021   Procedure: IR WITH ANESTHESIA EMBOLIZATION;  Surgeon: Dolphus Carrion, MD;  Location: [facility];  Service: Radiology;  Laterality: N/A;   SCLEROTHERAPY  07/24/2022   Procedure: SCLEROTHERAPY;  Surgeon: Eartha Angelia, Sieving, MD;  [facility];  Service: Gastroenterology;;   SUBMUCOSAL LIFTING INJECTION  08/29/2020   Procedure: SUBMUCOSAL LIFTING INJECTION;  Surgeon: Eartha Angelia, Sieving, MD;  [facility];  Service: Gastroenterology;;   SUBMUCOSAL LIFTING INJECTION  07/24/2022   Procedure: SUBMUCOSAL LIFTING INJECTION;   Surgeon: Eartha Angelia Sieving, MD;  [facility];  Service: Gastroenterology;;   TONSILLECTOMY  1970   Patient Active Problem List   Diagnosis Date Noted   Closed fracture of left fibula and tibia 02/02/2023   Acute diarrhea 12/23/2022   Functional dyspepsia 07/01/2022   DOE (dyspnea on exertion) 01/15/2022   Nausea with vomiting 05/28/2021   Duodenal adenoma 05/28/2021   Brain aneurysm 04/25/2021   History of herpes simplex infection 01/08/2021   Papanicolaou smear, as part of routine gynecological examination 01/08/2021   CVA (cerebral vascular accident) (HCC) 01/04/2021   Acute ischemic stroke (HCC) 01/03/2021   Hypokalemia 01/03/2021   Cerebral aneurysm 01/03/2021   Musculoskeletal pain 01/03/2021   Early satiety 11/27/2020   Chronic abdominal pain 07/13/2020   Malignant neoplasm of upper-inner quadrant of left breast in female, estrogen receptor positive (HCC) 04/16/2018   Essential hypertension 03/21/2015   Hyperlipidemia 03/21/2015   Genital herpes 03/21/2015   Fecal urgency 03/21/2015   GERD (gastroesophageal reflux disease) 03/21/2015    PCP: [name], MD  REFERRING PROVIDER:  Georgina Ozell LABOR, MD  REFERRING DIAG: S82.202D,S82.402D (ICD-10-CM) - Closed fracture of left tibia and fibula with routine healing, subsequent encounter   THERAPY DIAG:  Muscle weakness (generalized)  Closed fracture of left tibia and fibula with routine healing, subsequent encounter  Unsteadiness on feet  Impaired functional mobility, balance, gait, and endurance  Rationale for Evaluation and Treatment: Rehabilitation  ONSET DATE: 02/02/2023  SUBJECTIVE:   SUBJECTIVE STATEMENT: Pt reports she has tried to walking in her home without SPC but begins to walk too fast and gets tripped up. Reports 4/10 pain in L ankle today. Still doing HEP at home   PERTINENT HISTORY: Closed Fx of Left tibia and fibula Parkinson's Disease without Dyskinesia, without  mentation of fluctioantios Cerebral Aneurysm PAIN:  Are you having pain? No and Yes: NPRS scale: 4 Pain location: Lt ankle  Pain description: throb Aggravating factors: activitiy  Relieving factors: rest  PRECAUTIONS: Left CAM Boot-can transition to regular after a month in the boot  RED FLAGS: None   WEIGHT BEARING RESTRICTIONS: WBAT on LLE  FALLS:  Has patient fallen in last 6 months? Fall is MOI   PATIENT GOALS: To be able to walk on my own with AD  NEXT MD VISIT: in 3 months  OBJECTIVE:  Note: Objective measures were completed at Evaluation unless otherwise noted.  DIAGNOSTIC FINDINGS:   PATIENT SURVEYS:  LEFS 21/80 = 26.3%  06/24/23: 29/80=  36.3% COGNITION: Overall cognitive status: Within functional limits for tasks assessed     SENSATION: WFL  POSTURE: rounded shoulders and forward head  LOWER EXTREMITY ROM:  Active ROM Right eval Left eval Left 06/24/23: Left 07/24/23  Hip flexion      Hip extension      Hip abduction      Hip adduction      Hip internal rotation      Hip external rotation      Knee flexion      Knee extension      Ankle dorsiflexion 15 A5  P8 A8 I P12 10  Ankle plantarflexion   A 8  30  Ankle inversion WFL A2  P15 20 10  Ankle eversion WFL A10 20 A12 I P 20 11   (Blank rows = not tested)  LOWER EXTREMITY MMT:  MMT Right eval Left eval Right  06/24/23 Left 06/24/23 Right 07/24/23 Left 07/24/23  Hip flexion        Hip extension        Hip abduction        Hip adduction        Hip internal rotation        Hip external rotation        Knee flexion        Knee extension 3+ 4+ 4+ 4 4+ 4, pain  Ankle dorsiflexion 2 4 4  3- 4+ 4-, pain  Ankle plantarflexion 2+ 4 4 3  4+ 4-  Ankle inversion 2+ 4 4 3  4+ 3  Ankle eversion 2+ 4 4 3- 4+ 3   (Blank rows = not tested)   FUNCTIONAL TESTS:  30 Second Chair Stand Test: 5x  Norms:   Age 49-64 27-69 70-74 75-79 80-84 85-89 90-94  Women 15 15 14 13 12 11 9   Men 17 16 15 14  13 11 9    TUG: 47.96 seconds   06/24/23: 161ft with SPC in shoe 30 Second Chair Stand Test: 8x no HHA TUG: 24.07 GAIT: Distance walked: 24ft Assistive device utilized: Environmental consultant -  4 wheeled Level of assistance: SBA Comments: stepto pattern with RLE leading and then LLE                                                                                                                                TREATMENT DATE:  07/29/23:  NuStep, seat 9, 4 min  Gait training, w/o SPC, 30 ftx4, v cues for velocity and step length  Forward marching + backward ambulation, 26ft x4, emphasis on velocity 2nd round   Side Stepping, 30 ftx2 Tandem balance: 30 ea LE,  Better with RLE forward than LLE SLS: 30 each LE, unable to on L foot Forward Lunge onto 6 inch step for passive DF, 10x ea LE   07/24/2023  Progress note: Strength assessed, ROM assessed, LEFs, pt education on HEP compliance Therapeutic Exercise: -Speed step ups, 5.5 reps first set, 6 reps second set, 30 second bouts, pt cued for increased speed, requires occasional UE support, 8 inch step -Walking bout with 3lb ankle weights 220 feet, 2 40 foot segments of speed walking, pt requests to walk without the SPC halfway     07/21/23 Nustep level 3 seat 6 UE/LE 5 minutes Standing:  slant board stretch 3X30   Heelraise on incline 20X  Toe raise on decline 20X  Hip abduction 2X10 each Aeromat balance beam tandem gait with CGA 1RT Aeromat balance beam sidestepping with min-modA 1RT [employer] leg press 3pl 10X   07/15/2023  Therapeutic Exercise: -Stationary bike, 3 minutes, 60 spm, level 1 resistance, pt states this wore her out -Supine Ankle AROM each direction 20 reps  -TKE, 6 inch step, 2 sets of 10 reps, BUE support, pt cued for decreased UE support -Aeromat walks, tandem stepping and lateral stepping, 1 lap each variation, no UE support for about 5 steps at a time -Leg press 1 set of 10 bilateral plate 3, 1 set of 10 LLE only plate  2 -Treadmill, 2 minutes, 30 seconds, 2.0 grade, pt cued for increased foot clearance and bigger strides -Sled push/pull, 40 lbs first lap, 20 lbs second lap   PATIENT EDUCATION:  Education details: PT Evaluation, findings, prognosis, frequency, attendance policy, and HEP. Person educated: Patient Education method: Explanation and Demonstration Education comprehension: verbalized understanding  HOME EXERCISE PROGRAM: Access Code: [email or web address] 07/10/23 - Seated Ankle Alphabet  - 1 x daily - 7 x weekly - 1 sets - 1 reps - Ankle Dorsiflexion with Resistance  - 1 x daily - 7 x weekly - 1 sets - 10 reps - 5 hold - Seated Ankle Eversion with Resistance  - 1 x daily - 7 x weekly - 1 sets - 10 reps - 5 hold - Ankle Inversion with Resistance  - 1 x daily - 7 x weekly - 1 sets - 10 reps - 5 hold - Gastroc Stretch on Wall  - 2 x daily - 7 x weekly - 1 sets - 3 reps - 30 hold  URL: [email or web address] Date: 05/16/2023 Prepared by: Omega Bottcher  Exercises - Seated Ankle Dorsiflexion Stretch  - 1 x daily - 7 x weekly - 3 sets - 10 reps - Seated Ankle Pumps  - 1 x daily - 7 x weekly - 3 sets - 20 reps - Seated Ankle Circles  - 1 x daily - 7 x weekly - 3 sets - 26 reps - Sit to Stand with Counter Support  - 1 x daily - 7 x weekly - 3 sets - 10 reps  Access Code: [email or web address] URL: [email or web address] Date: 07/29/2023 Prepared by: Rosaria Powell-Butler  Exercises - Single Leg Stance with Support  - 2 x daily - 7 x weekly - 3 sets - 30 hold  ASSESSMENT:  CLINICAL IMPRESSION: Patient continues to demonstrate decreased LLE strength and decreased L ankle ROM, decreased gait quality and balance. Patient also demonstrates decreased endurance of LE strength with aerobic based exercise during today's session. Patient able to demonstrate meeting 4/7 goals so far. Patient would benefit from additional 3 weeks with 2 visits per week for increased endurance with ambulation,  increased LE strength, and improved balance for improved quality of life, improved independence with gait training and continued progress towards remaining therapy goals.    OBJECTIVE IMPAIRMENTS: Abnormal gait, decreased activity tolerance, decreased balance, decreased mobility, difficulty walking, decreased ROM, decreased strength, hypomobility, postural dysfunction, and pain.   ACTIVITY LIMITATIONS: carrying, lifting, bending, standing, squatting, stairs, transfers, bed mobility, and locomotion level  PARTICIPATION LIMITATIONS: meal prep, driving, community activity, occupation, and yard work  PERSONAL FACTORS: Age and 1-2 comorbidities: Parkinson's are also affecting patient's functional outcome.   REHAB POTENTIAL: Good  CLINICAL DECISION MAKING: Stable/uncomplicated  EVALUATION COMPLEXITY: Low   GOALS: Goals reviewed with patient? Yes  SHORT TERM GOALS: Target date: 07/11/23  Pt will be independent with HEP in order to demonstrate participation in Physical Therapy POC.  Baseline:  06/24/23:  Reports compliance with HEP. Goal status: MET  2.  Pt will 3/10 pain during mobility in order to demonstrate improved pain while performing ADLs.  Baseline: 06/24/23:  Pain scale average 2-8/10 Goal status: MET  LONG TERM GOALS: Target date: 08/14/23  Pt will improve 30 Second Chair Stand Test by at least 2 reps in order to demonstrate improved functional strength to return to desired activities.  Baseline: see objective.; 06/24/23:  able to complete 8 times no HHA, was 5 initial eval. Goal status: MET  2.  Pt will improve TUG by at least 15 seconds in order to demonstrate improved functional ambulatory capacity in community setting.  Baseline: see objective. 07/14/23:  Improved to 24.07 was 47.96 seconds Goal status: MET  3.  Pt will improve LEFS score by 20% in order to demonstrate improved pain with functional goals and outcomes. Baseline: see objective. ; 07/24/23: 38/80;  06/24/23:  29/80=  36.3% was LEFS 21/80 = 26.3% Goal status: IN PROGRESS  4.  Pt will improve LLE ankle ROM by  at least 5 degrees in order to improve symmetry during functional activities.. Baseline: see objective.  Goal status: IN PROGRESS  5.  Pt will improve BLE MMT at least 1/2 grade  in order to improve endurance, balance and tolerance during functional activities.. Baseline: see objective.  Goal status: IN PROGRESS   PLAN:  PT FREQUENCY: 2x/week  PT DURATION: 3 weeks  PLANNED INTERVENTIONS: [id number]- PT Re-evaluation, 97750- Physical Performance Testing, 97110-Therapeutic exercises, 97530- Therapeutic activity, [id number]- Neuromuscular re-education, 97535- Self Care, [id number]- Manual therapy, [phone]- Gait training,  [id number]- Electrical stimulation (unattended), [phone]- Electrical stimulation (manual), Patient/Family education, Balance training, Stair training, Joint mobilization, Joint manipulation, Spinal manipulation, Spinal mobilization, DME instructions, Cryotherapy, and Moist heat  PLAN FOR NEXT SESSION: LE strengthening, balance, walking speed  3:28 PM, 07/29/23 Tasia Liz Powell-Butler, PT, DPT [facility] [place]

## 2023-07-30 DIAGNOSIS — E559 Vitamin D deficiency, unspecified: Secondary | ICD-10-CM | POA: Diagnosis not present

## 2023-07-30 DIAGNOSIS — D649 Anemia, unspecified: Secondary | ICD-10-CM | POA: Diagnosis not present

## 2023-07-30 DIAGNOSIS — D559 Anemia due to enzyme disorder, unspecified: Secondary | ICD-10-CM | POA: Diagnosis not present

## 2023-08-05 DIAGNOSIS — D132 Benign neoplasm of duodenum: Secondary | ICD-10-CM | POA: Diagnosis not present

## 2023-08-05 DIAGNOSIS — K30 Functional dyspepsia: Secondary | ICD-10-CM | POA: Diagnosis not present

## 2023-08-05 DIAGNOSIS — S82202A Unspecified fracture of shaft of left tibia, initial encounter for closed fracture: Secondary | ICD-10-CM | POA: Diagnosis not present

## 2023-08-06 DIAGNOSIS — I10 Essential (primary) hypertension: Secondary | ICD-10-CM | POA: Diagnosis not present

## 2023-08-06 DIAGNOSIS — Z853 Personal history of malignant neoplasm of breast: Secondary | ICD-10-CM | POA: Diagnosis not present

## 2023-08-06 DIAGNOSIS — R5383 Other fatigue: Secondary | ICD-10-CM | POA: Diagnosis not present

## 2023-08-06 DIAGNOSIS — E039 Hypothyroidism, unspecified: Secondary | ICD-10-CM | POA: Diagnosis not present

## 2023-08-06 DIAGNOSIS — E559 Vitamin D deficiency, unspecified: Secondary | ICD-10-CM | POA: Diagnosis not present

## 2023-08-06 DIAGNOSIS — Z6824 Body mass index (BMI) 24.0-24.9, adult: Secondary | ICD-10-CM | POA: Diagnosis not present

## 2023-08-07 ENCOUNTER — Other Ambulatory Visit (HOSPITAL_COMMUNITY): Payer: Self-pay | Admitting: Internal Medicine

## 2023-08-07 DIAGNOSIS — N644 Mastodynia: Secondary | ICD-10-CM

## 2023-08-12 ENCOUNTER — Other Ambulatory Visit (HOSPITAL_COMMUNITY): Payer: Self-pay | Admitting: Internal Medicine

## 2023-08-12 ENCOUNTER — Encounter (HOSPITAL_COMMUNITY): Payer: Self-pay | Admitting: Internal Medicine

## 2023-08-12 ENCOUNTER — Ambulatory Visit (HOSPITAL_COMMUNITY)
Admission: RE | Admit: 2023-08-12 | Discharge: 2023-08-12 | Discharge status: Home / Self Care | Source: Ambulatory Visit | Attending: Adult Health | Admitting: Adult Health

## 2023-08-12 ENCOUNTER — Ambulatory Visit (HOSPITAL_COMMUNITY)
Admission: RE | Admit: 2023-08-12 | Discharge: 2023-08-12 | Disposition: A | Discharge status: Home / Self Care | Source: Ambulatory Visit | Attending: Adult Health | Admitting: Adult Health

## 2023-08-12 DIAGNOSIS — C50212 Malignant neoplasm of upper-inner quadrant of left female breast: Secondary | ICD-10-CM | POA: Diagnosis not present

## 2023-08-12 DIAGNOSIS — Z170 Estrogen receptor positive status [ER+]: Secondary | ICD-10-CM | POA: Diagnosis not present

## 2023-08-12 DIAGNOSIS — N6325 Unspecified lump in the left breast, overlapping quadrants: Secondary | ICD-10-CM | POA: Diagnosis not present

## 2023-08-12 DIAGNOSIS — N644 Mastodynia: Secondary | ICD-10-CM

## 2023-08-12 DIAGNOSIS — R92322 Mammographic fibroglandular density, left breast: Secondary | ICD-10-CM | POA: Diagnosis not present

## 2023-08-12 DIAGNOSIS — Z853 Personal history of malignant neoplasm of breast: Secondary | ICD-10-CM | POA: Diagnosis not present

## 2023-08-12 DIAGNOSIS — E039 Hypothyroidism, unspecified: Secondary | ICD-10-CM | POA: Diagnosis not present

## 2023-08-12 DIAGNOSIS — Z1329 Encounter for screening for other suspected endocrine disorder: Secondary | ICD-10-CM | POA: Diagnosis not present

## 2023-08-12 DIAGNOSIS — R928 Other abnormal and inconclusive findings on diagnostic imaging of breast: Secondary | ICD-10-CM

## 2023-08-15 ENCOUNTER — Inpatient Hospital Stay (HOSPITAL_BASED_OUTPATIENT_CLINIC_OR_DEPARTMENT_OTHER): Admitting: Adult Health

## 2023-08-15 ENCOUNTER — Other Ambulatory Visit (HOSPITAL_COMMUNITY): Payer: Self-pay | Admitting: Internal Medicine

## 2023-08-15 ENCOUNTER — Telehealth: Payer: Self-pay | Admitting: Adult Health

## 2023-08-15 DIAGNOSIS — C50212 Malignant neoplasm of upper-inner quadrant of left female breast: Secondary | ICD-10-CM | POA: Diagnosis not present

## 2023-08-15 DIAGNOSIS — Z170 Estrogen receptor positive status [ER+]: Secondary | ICD-10-CM | POA: Diagnosis not present

## 2023-08-15 DIAGNOSIS — R928 Other abnormal and inconclusive findings on diagnostic imaging of breast: Secondary | ICD-10-CM

## 2023-08-15 MED ORDER — OXYCODONE HCL 5 MG PO TABS: 5.0000 mg | ORAL_TABLET | Freq: Two times a day (BID) | ORAL | 0 refills | Status: DC | PRN

## 2023-08-15 NOTE — Telephone Encounter (Signed)
 Spoke with patient confirming upcoming appointment

## 2023-08-15 NOTE — Progress Notes (Signed)
 [facility] Cancer Follow up:    Kimberly Elspeth BRAVO, MD [address] [place] [address]   DIAGNOSIS:  Cancer Staging  Malignant neoplasm of upper-inner quadrant of left breast in female, estrogen receptor positive (HCC) Staging form: Breast, AJCC 8th Edition - Pathologic: Stage IA (pT1c, pN0, cM0, G2, ER+, PR-, HER2-, Oncotype DX score: 30) - Signed by Crawford Morna Pickle, NP on 07/22/2023 Multigene prognostic tests performed: Oncotype DX Recurrence score range: Greater than or equal to 11 Histologic grading system: 3 grade system  I connected with Kimberly Mccormick on 08/17/23 at  1:40 PM EDT by telephone and verified that I am speaking with the correct person using two identifiers.  I discussed the limitations, risks, security and privacy concerns of performing an evaluation and management service by telephone and the availability of in person appointments.  I also discussed with the patient that there may be a patient responsible charge related to this service. The patient expressed understanding and agreed to proceed.  Patient location: home Provider location: [facility] office  SUMMARY OF ONCOLOGIC HISTORY: Oncology History  Malignant neoplasm of upper-inner quadrant of left breast in female, estrogen receptor positive (HCC)  03/24/2018 Initial Diagnosis   Screening detected left breast mass 8 mm upper inner quadrant biopsy revealed grade 2 IDC with DCIS ER 95%, PR 0%, Ki-67 20%, HER-2 -1+ by IHC, T1 BN 0 stage Ia clinical stage   05/06/2018 Surgery   Lumpectomy Warden): IDC with DCIS, 1.8cm, grade 2, ER+ (95%), PR-, HER2 negative (1+, IHC), Ki67 20%, clear margins, 4 SLN negative.    05/18/2018 Oncotype testing   Oncotype DX recurrence score 30: risk of distant recurrence at 9 years is 19%. Chemo benefit is >15%.    06/23/2018 -  Chemotherapy   Adjuvant chemotherapy with dose dense Adriamycin and Cytoxan  x4 followed by Taxol weekly x12   07/22/2023 Cancer Staging   Staging  form: Breast, AJCC 8th Edition - Pathologic: Stage IA (pT1c, pN0, cM0, G2, ER+, PR-, HER2-, Oncotype DX score: 30) - Signed by Crawford Morna Pickle, NP on 07/22/2023 Multigene prognostic tests performed: Oncotype DX Recurrence score range: Greater than or equal to 11 Histologic grading system: 3 grade system     CURRENT THERAPY: observation  INTERVAL HISTORY:  Continues ot have left breast pain, mm done 7/22, area of concern noted and biopsy scheduled 7/29. Has increased breast pain, sometimes impacting her sleep.  Oxycodone  has helped for this before.     Patient Active Problem List   Diagnosis Date Noted   Closed fracture of left fibula and tibia 02/02/2023   Acute diarrhea 12/23/2022   Functional dyspepsia 07/01/2022   DOE (dyspnea on exertion) 01/15/2022   Nausea with vomiting 05/28/2021   Duodenal adenoma 05/28/2021   Brain aneurysm 04/25/2021   History of herpes simplex infection 01/08/2021   Papanicolaou smear, as part of routine gynecological examination 01/08/2021   CVA (cerebral vascular accident) (HCC) 01/04/2021   Acute ischemic stroke (HCC) 01/03/2021   Hypokalemia 01/03/2021   Cerebral aneurysm 01/03/2021   Musculoskeletal pain 01/03/2021   Early satiety 11/27/2020   Chronic abdominal pain 07/13/2020   Malignant neoplasm of upper-inner quadrant of left breast in female, estrogen receptor positive (HCC) 04/16/2018   Essential hypertension 03/21/2015   Hyperlipidemia 03/21/2015   Genital herpes 03/21/2015   Fecal urgency 03/21/2015   GERD (gastroesophageal reflux disease) 03/21/2015    is allergic to penicillins, egg-derived products, and other.  MEDICAL HISTORY: Past Medical History:  Diagnosis Date   Aneurysm of left internal carotid artery    Small supraclinoid 5 mm   Breast cancer (HCC)    Left s/p lumpectomy (05/06/2018) and adjuvant chemotherapy (06/23/2018) with Adriamycin and Cytoxan  x4 followed by Taxol weekly x12   DDD (degenerative disc  disease), lumbar    Essential hypertension    Genital warts    GERD (gastroesophageal reflux disease)    History of anemia    History of renal insufficiency    Hypertension    Insomnia    Iron deficiency anemia    Ischemic stroke [facility])    December 2022   Mixed hyperlipidemia    Osteoarthritis    Parkinson's disease (HCC)    Peripheral neuropathy    Related to chemotherapy   Personal history of chemotherapy 2020   Port-A-Cath in place 06/23/2018    SURGICAL HISTORY: Past Surgical History:  Procedure Laterality Date   APPENDECTOMY  1966   BIOPSY  08/29/2020   Procedure: BIOPSY;  Surgeon: Eartha Angelia Sieving, MD;  [facility];  Service: Gastroenterology;;   BIOPSY  07/24/2022   Procedure: BIOPSY;  Surgeon: Eartha Angelia Sieving, MD;  [facility];  Service: Gastroenterology;;   BREAST LUMPECTOMY WITH RADIOACTIVE SEED AND SENTINEL LYMPH NODE BIOPSY Left 05/06/2018   Procedure: LEFT BREAST LUMPECTOMY WITH RADIOACTIVE SEED AND LEFT DEEP AXILLARY SENTINEL LYMPH NODE BIOPSY AND BLUE DYE INJECTION;  Surgeon: Gail Favorite, MD;  Location: [facility];  Service: General;  Laterality: Left;   CATARACT EXTRACTION W/PHACO Left 01/10/2014   Procedure: CATARACT EXTRACTION PHACO AND INTRAOCULAR LENS PLACEMENT ; CDE:  4.94;  Surgeon: Dow JULIANNA Burke, MD;  Location: [facility];  Service: Ophthalmology;  Laterality: Left;   CESAREAN SECTION  1986   CHOLECYSTECTOMY     COLONOSCOPY WITH PROPOFOL  N/A 07/25/2020   Procedure: COLONOSCOPY WITH PROPOFOL ;  Surgeon: Eartha Angelia Sieving, MD;  [facility];  Service: Gastroenterology;  Laterality: N/A;  10:55   ESOPHAGOGASTRODUODENOSCOPY (EGD) WITH PROPOFOL  N/A 08/29/2020   Procedure: ESOPHAGOGASTRODUODENOSCOPY (EGD) WITH PROPOFOL ;  Surgeon: Eartha Angelia Sieving, MD;  [facility];  Service: Gastroenterology;  Laterality: N/A;  12:00   ESOPHAGOGASTRODUODENOSCOPY (EGD) WITH PROPOFOL  N/A  07/24/2022   Procedure: ESOPHAGOGASTRODUODENOSCOPY (EGD) WITH PROPOFOL ;  Surgeon: Eartha Angelia Sieving, MD;  [facility];  Service: Gastroenterology;  Laterality: N/A;  12:45;ASA 1-2   HOT HEMOSTASIS  07/24/2022   Procedure: HOT HEMOSTASIS (ARGON PLASMA COAGULATION/BICAP);  Surgeon: Eartha Angelia, Sieving, MD;  [facility];  Service: Gastroenterology;;   IR 3D INDEPENDENT WKST  04/25/2021   IR ANGIO INTRA EXTRACRAN SEL COM CAROTID INNOMINATE UNI R MOD SED  04/25/2021   IR ANGIO INTRA EXTRACRAN SEL INTERNAL CAROTID UNI L MOD SED  04/25/2021   IR ANGIOGRAM FOLLOW UP STUDY  04/25/2021   IR CT HEAD LTD  04/25/2021   IR NEURO EACH ADD'L AFTER BASIC UNI LEFT (MS)  04/25/2021   IR RADIOLOGIST EVAL & MGMT  03/22/2021   IR RADIOLOGIST EVAL & MGMT  03/28/2021   IR RADIOLOGIST EVAL & MGMT  05/15/2021   IR RADIOLOGIST EVAL & MGMT  09/28/2021   IR TRANSCATH/EMBOLIZ  04/25/2021   IR US  GUIDE VASC ACCESS RIGHT  04/25/2021   MYRINGOTOMY WITH TUBE PLACEMENT Right 02/04/2017   Procedure: REVISION OF RIGHT MYRINGOTOMY WITH TUBE PLACEMENT, WITH EXAM OF LEFT EAR;  Surgeon: Karis Clunes, MD;  Location: [facility];  Service: ENT;  Laterality: Right;  NASAL SINUS SURGERY  2016   polypectomy   OPEN REDUCTION INTERNAL FIXATION (ORIF) TIBIA/FIBULA FRACTURE Left 02/02/2023   Procedure: OPEN REDUCTION INTERNAL FIXATION (ORIF) LEFT TIBIA/FIBULA FRACTURE;  Surgeon: Georgina Ozell LABOR, MD;  Location: [facility];  Service: Orthopedics;  Laterality: Left;   POLYPECTOMY  07/25/2020   Procedure: POLYPECTOMY;  Surgeon: Eartha Angelia Sieving, MD;  [facility];  Service: Gastroenterology;;   POLYPECTOMY  08/29/2020   Procedure: POLYPECTOMY;  Surgeon: Eartha Angelia Sieving, MD;  [facility];  Service: Gastroenterology;;   POLYPECTOMY  07/24/2022   Procedure: POLYPECTOMY;  Surgeon: Eartha Angelia Sieving, MD;  [facility];  Service: Gastroenterology;;   [facility] REMOVAL N/A  01/06/2019   Procedure: REMOVAL PORT-A-CATH;  Surgeon: Gail Favorite, MD;  Location: [facility];  Service: General;  Laterality: N/A;   PORTACATH PLACEMENT Right 06/16/2018   Procedure: INSERTION PORT-A-CATH;  Surgeon: Gail Favorite, MD;  Location: [facility];  Service: General;  Laterality: Right;   RADIOLOGY WITH ANESTHESIA N/A 04/25/2021   Procedure: IR WITH ANESTHESIA EMBOLIZATION;  Surgeon: Dolphus Carrion, MD;  Location: [facility];  Service: Radiology;  Laterality: N/A;   SCLEROTHERAPY  07/24/2022   Procedure: SCLEROTHERAPY;  Surgeon: Eartha Angelia, Sieving, MD;  [facility];  Service: Gastroenterology;;   SUBMUCOSAL LIFTING INJECTION  08/29/2020   Procedure: SUBMUCOSAL LIFTING INJECTION;  Surgeon: Eartha Angelia, Sieving, MD;  [facility];  Service: Gastroenterology;;   SUBMUCOSAL LIFTING INJECTION  07/24/2022   Procedure: SUBMUCOSAL LIFTING INJECTION;  Surgeon: Eartha Angelia, Sieving, MD;  [facility];  Service: Gastroenterology;;   TONSILLECTOMY  1970    SOCIAL HISTORY: Social History   Socioeconomic History   Marital status: Legally Separated    Spouse name: Not on file   Number of children: 2   Years of education: Not on file   Highest education level: Not on file  Occupational History    Comment: retired  Tobacco Use   Smoking status: Never   Smokeless tobacco: Never  Vaping Use   Vaping status: Never Used  Substance and Sexual Activity   Alcohol use: No   Drug use: No   Sexual activity: Not Currently    Birth control/protection: Post-menopausal  Other Topics Concern   Not on file  Social History Narrative   One son lives with her   Social Drivers of Health   Financial Resource Strain: Low Risk  (01/08/2021)   Overall Financial Resource Strain (CARDIA)    Difficulty of Paying Living Expenses: Not hard at all  Food Insecurity: No Food Insecurity (02/02/2023)   Hunger Vital Sign    Worried  About Running Out of Food in the Last Year: Never true    Ran Out of Food in the Last Year: Never true  Transportation Needs: No Transportation Needs (02/02/2023)   PRAPARE - Administrator, Civil Service (Medical): No    Lack of Transportation (Non-Medical): No  Physical Activity: Inactive (01/08/2021)   Exercise Vital Sign    Days of Exercise per Week: 0 days    Minutes of Exercise per Session: 0 min  Stress: No Stress Concern Present (01/08/2021)   [employer] of Occupational Health - Occupational Stress Questionnaire    Feeling of Stress : Not at all  Social Connections: Moderately Integrated (02/02/2023)   Social Connection and Isolation Panel    Frequency of Communication with Friends and Family: More than three times a week  Frequency of Social Gatherings with Friends and Family: More than three times a week    Attends Religious Services: More than 4 times per year    Active Member of [employer] or Organizations: Yes    Attends Engineer, structural: More than 4 times per year    Marital Status: Divorced  Intimate Partner Violence: Not At Risk (02/02/2023)   Humiliation, Afraid, Rape, and Kick questionnaire    Fear of Current or Ex-Partner: No    Emotionally Abused: No    Physically Abused: No    Sexually Abused: No    FAMILY HISTORY: Family History  Problem Relation Age of Onset   Lupus Mother    Heart attack Father        age 59   Prostate cancer Father    Hypothyroidism Sister    Breast cancer Sister    Hypertension Sister    Breast cancer Sister    Hypertension Brother    Parkinson's disease Paternal Aunt    Stroke Neg Hx     Review of Systems  Constitutional:  Negative for appetite change, chills, fatigue, fever and unexpected weight change.  HENT:   Negative for hearing loss, lump/mass and trouble swallowing.   Eyes:  Negative for eye problems and icterus.  Respiratory:  Negative for chest tightness, cough and shortness of breath.    Cardiovascular:  Negative for chest pain, leg swelling and palpitations.  Gastrointestinal:  Negative for abdominal distention, abdominal pain, constipation, diarrhea, nausea and vomiting.  Endocrine: Negative for hot flashes.  Genitourinary:  Negative for difficulty urinating.   Musculoskeletal:  Negative for arthralgias.  Skin:  Negative for itching and rash.  Neurological:  Negative for dizziness, extremity weakness, headaches and numbness.  Hematological:  Negative for adenopathy. Does not bruise/bleed easily.  Psychiatric/Behavioral:  Negative for depression. The patient is not nervous/anxious.       PHYSICAL EXAMINATION  Patient sounds well in no apparent distress, mood/behavior normal, speech is normal.       ASSESSMENT and THERAPY PLAN:   Malignant neoplasm of upper-inner quadrant of left breast in female, estrogen receptor positive (HCC) 05/08/2018 lumpectomy Warden): IDC with DCIS, 1.8cm, grade 2, ER+ (95%), PR-, HER2 negative (1+, IHC), Ki67 20%, clear margins, 4 SLN negative. Oncotype DX score 30: Distant recurrence risk at 9 years 19% Treatment plan: 1.  Adjuvant chemotherapy with Taxotere  and Cytoxan  x4 (patient refused) 2.  Adjuvant radiation therapy at [facility] 3.  Followed by adjuvant antiestrogen therapy: (Patient undecided) ----------------------------------------------------------------------------------------------------------------------------------  CT chest abdomen pelvis 02/11/2019: No evidence of metastatic disease.   December 2022: Stroke Hospitalization 04/25/2021-04/27/2021 left internal carotid artery aneurysm embolization   Assessment and Plan Assessment & Plan Left breast cancer Stage 1A, estrogen positive, post lumpectomy, chemotherapy, and radiation. Focal pain and tenderness noted, abnormality noted on mammogram.  Biopsy next week. - Await biopsy results from next week  Breast Pain.  Successfully managed with Oxycodone .  - Oxycodone  sent to  patient pharmacy. - F/u on breast pain after biopsy results - Consider referral to PT.      The patient was provided an opportunity to ask questions and all were answered. The patient agreed with the plan and demonstrated an understanding of the instructions.   The patient was advised to call back or seek an in-person evaluation if the symptoms worsen or if the condition fails to improve as anticipated.   I provided 10 minutes of non face-to-face telephone visit time during this encounter, and > 50% was spent  counseling as documented under my assessment & plan.   Morna Kendall, NP 08/17/23 11:16 PM Medical Oncology and Hematology [facility] [address] [place], [place] [address] Tel. [phone]    Fax. [phone]  *Total Encounter Time as defined by the Centers for Medicare and Medicaid Services includes, in addition to the face-to-face time of a patient visit (documented in the note above) non-face-to-face time: obtaining and reviewing outside history, ordering and reviewing medications, tests or procedures, care coordination (communications with other health care professionals or caregivers) and documentation in the medical record.

## 2023-08-17 NOTE — Assessment & Plan Note (Addendum)
 05/08/2018 lumpectomy Warden): IDC with DCIS, 1.8cm, grade 2, ER+ (95%), PR-, HER2 negative (1+, IHC), Ki67 20%, clear margins, 4 SLN negative. Oncotype DX score 30: Distant recurrence risk at 9 years 19% Treatment plan: 1.  Adjuvant chemotherapy with Taxotere  and Cytoxan  x4 (patient refused) 2.  Adjuvant radiation therapy at [facility] 3.  Followed by adjuvant antiestrogen therapy: (Patient undecided) ----------------------------------------------------------------------------------------------------------------------------------  CT chest abdomen pelvis 02/11/2019: No evidence of metastatic disease.   December 2022: Stroke Hospitalization 04/25/2021-04/27/2021 left internal carotid artery aneurysm embolization   Assessment and Plan Assessment & Plan Left breast cancer Stage 1A, estrogen positive, post lumpectomy, chemotherapy, and radiation. Focal pain and tenderness noted, abnormality noted on mammogram.  Biopsy next week. - Await biopsy results from next week  Breast Pain.  Successfully managed with Oxycodone .  - Oxycodone  sent to patient pharmacy. - F/u on breast pain after biopsy results - Consider referral to PT.

## 2023-08-18 DIAGNOSIS — E039 Hypothyroidism, unspecified: Secondary | ICD-10-CM | POA: Diagnosis not present

## 2023-08-18 DIAGNOSIS — E559 Vitamin D deficiency, unspecified: Secondary | ICD-10-CM | POA: Diagnosis not present

## 2023-08-18 DIAGNOSIS — Z6824 Body mass index (BMI) 24.0-24.9, adult: Secondary | ICD-10-CM | POA: Diagnosis not present

## 2023-08-18 DIAGNOSIS — E782 Mixed hyperlipidemia: Secondary | ICD-10-CM | POA: Diagnosis not present

## 2023-08-18 DIAGNOSIS — R5383 Other fatigue: Secondary | ICD-10-CM | POA: Diagnosis not present

## 2023-08-19 ENCOUNTER — Ambulatory Visit (HOSPITAL_COMMUNITY)
Admission: RE | Admit: 2023-08-19 | Discharge: 2023-08-19 | Disposition: A | Discharge status: Home / Self Care | Source: Ambulatory Visit | Attending: Internal Medicine | Admitting: Internal Medicine

## 2023-08-19 ENCOUNTER — Encounter (HOSPITAL_COMMUNITY): Payer: Self-pay

## 2023-08-19 DIAGNOSIS — N641 Fat necrosis of breast: Secondary | ICD-10-CM | POA: Diagnosis not present

## 2023-08-19 DIAGNOSIS — R928 Other abnormal and inconclusive findings on diagnostic imaging of breast: Secondary | ICD-10-CM | POA: Insufficient documentation

## 2023-08-19 DIAGNOSIS — R920 Mammographic microcalcification found on diagnostic imaging of breast: Secondary | ICD-10-CM | POA: Diagnosis not present

## 2023-08-19 DIAGNOSIS — N6032 Fibrosclerosis of left breast: Secondary | ICD-10-CM | POA: Diagnosis not present

## 2023-08-19 DIAGNOSIS — N6325 Unspecified lump in the left breast, overlapping quadrants: Secondary | ICD-10-CM | POA: Diagnosis not present

## 2023-08-19 HISTORY — PX: BREAST BIOPSY: SHX20

## 2023-08-19 MED ORDER — LIDOCAINE HCL (PF) 2 % IJ SOLN
INTRAMUSCULAR | Status: AC
  Filled 2023-08-19: qty 10

## 2023-08-19 MED ORDER — LIDOCAINE HCL (PF) 2 % IJ SOLN
10.0000 mL | Freq: Once | INTRAMUSCULAR | Status: AC
  Administered 2023-08-19: 10 mL

## 2023-08-19 MED ORDER — LIDOCAINE-EPINEPHRINE (PF) 1 %-1:200000 IJ SOLN
10.0000 mL | Freq: Once | INTRAMUSCULAR | Status: AC
  Administered 2023-08-19: 10 mL via INTRADERMAL

## 2023-08-19 NOTE — Progress Notes (Signed)
 PT tolerated Left breast biopsy well today with NAD noted. PT verbalized understanding of discharge instructions. PT ambulated back to the mammogram area this time. Specimens taken to lab by Bernarda from ultrasound for processing.

## 2023-08-20 ENCOUNTER — Encounter (HOSPITAL_COMMUNITY): Payer: Self-pay

## 2023-08-20 ENCOUNTER — Ambulatory Visit (HOSPITAL_COMMUNITY)

## 2023-08-20 DIAGNOSIS — S82402D Unspecified fracture of shaft of left fibula, subsequent encounter for closed fracture with routine healing: Secondary | ICD-10-CM

## 2023-08-20 DIAGNOSIS — R2681 Unsteadiness on feet: Secondary | ICD-10-CM | POA: Diagnosis not present

## 2023-08-20 DIAGNOSIS — Z7409 Other reduced mobility: Secondary | ICD-10-CM

## 2023-08-20 DIAGNOSIS — M6281 Muscle weakness (generalized): Secondary | ICD-10-CM

## 2023-08-20 DIAGNOSIS — S82202D Unspecified fracture of shaft of left tibia, subsequent encounter for closed fracture with routine healing: Secondary | ICD-10-CM | POA: Diagnosis not present

## 2023-08-20 NOTE — Therapy (Signed)
 OUTPATIENT PHYSICAL THERAPY LOWER EXTREMITY TREATMENT/PROGRESS NOTE  Progress Note Reporting Period 07/24/23 to 08/20/23  See note below for Objective Data and Assessment of Progress/Goals.       Patient Name: Kimberly Mccormick MRN: [id number] DOB:10-19-1950, 73 y.o., female Today's Date: 08/20/2023  END OF SESSION:  PT End of Session - 08/20/23 1153     Visit Number 19    Number of Visits 24    Date for PT Re-Evaluation 08/14/23    Authorization Type [employer] Dual Complete    Authorization Time Period no auth; no limit    Progress Note Due on Visit 23    PT Start Time 1153    PT Stop Time 1225    PT Time Calculation (min) 32 min    Equipment Utilized During Treatment Gait belt    Activity Tolerance Patient tolerated treatment well    Behavior During Therapy WFL for tasks assessed/performed            Past Medical History:  Diagnosis Date   Aneurysm of left internal carotid artery    Small supraclinoid 5 mm   Breast cancer (HCC)    Left s/p lumpectomy (05/06/2018) and adjuvant chemotherapy (06/23/2018) with Adriamycin and Cytoxan  x4 followed by Taxol weekly x12   DDD (degenerative disc disease), lumbar    Essential hypertension    Genital warts    GERD (gastroesophageal reflux disease)    History of anemia    History of renal insufficiency    Hypertension    Insomnia    Iron deficiency anemia    Ischemic stroke [facility])    December 2022   Mixed hyperlipidemia    Osteoarthritis    Parkinson's disease (HCC)    Peripheral neuropathy    Related to chemotherapy   Personal history of chemotherapy 2020   Port-A-Cath in place 06/23/2018   Past Surgical History:  Procedure Laterality Date   APPENDECTOMY  1966   BIOPSY  08/29/2020   Procedure: BIOPSY;  Surgeon: Eartha Angelia Sieving, MD;  [facility];  Service: Gastroenterology;;   BIOPSY  07/24/2022   Procedure: BIOPSY;  Surgeon: Eartha Angelia Sieving, MD;  [facility];  Service:  Gastroenterology;;   BREAST BIOPSY Left 08/19/2023   US  LT BREAST BX W LOC DEV 1ST LESION IMG BX SPEC US  GUIDE 08/19/2023 [facility]-UL[facility]OUND   BREAST LUMPECTOMY WITH RADIOACTIVE SEED AND SENTINEL LYMPH NODE BIOPSY Left 05/06/2018   Procedure: LEFT BREAST LUMPECTOMY WITH RADIOACTIVE SEED AND LEFT DEEP AXILLARY SENTINEL LYMPH NODE BIOPSY AND BLUE DYE INJECTION;  Surgeon: Gail Favorite, MD;  Location: [facility];  Service: General;  Laterality: Left;   CATARACT EXTRACTION W/PHACO Left 01/10/2014   Procedure: CATARACT EXTRACTION PHACO AND INTRAOCULAR LENS PLACEMENT ; CDE:  4.94;  Surgeon: Dow JULIANNA Burke, MD;  Location: [facility];  Service: Ophthalmology;  Laterality: Left;   CESAREAN SECTION  1986   CHOLECYSTECTOMY     COLONOSCOPY WITH PROPOFOL  N/A 07/25/2020   Procedure: COLONOSCOPY WITH PROPOFOL ;  Surgeon: Eartha Angelia Sieving, MD;  [facility];  Service: Gastroenterology;  Laterality: N/A;  10:55   ESOPHAGOGASTRODUODENOSCOPY (EGD) WITH PROPOFOL  N/A 08/29/2020   Procedure: ESOPHAGOGASTRODUODENOSCOPY (EGD) WITH PROPOFOL ;  Surgeon: Eartha Angelia Sieving, MD;  [facility];  Service: Gastroenterology;  Laterality: N/A;  12:00   ESOPHAGOGASTRODUODENOSCOPY (EGD) WITH PROPOFOL  N/A 07/24/2022   Procedure: ESOPHAGOGASTRODUODENOSCOPY (EGD) WITH PROPOFOL ;  Surgeon: Eartha Angelia Sieving, MD;  [facility];  Service: Gastroenterology;  Laterality: N/A;  12:45;ASA 1-2   HOT HEMOSTASIS  07/24/2022   Procedure: HOT HEMOSTASIS (ARGON PLASMA COAGULATION/BICAP);  Surgeon: Eartha Flavors, Toribio, MD;  [facility];  Service: Gastroenterology;;   IR 3D INDEPENDENT WKST  04/25/2021   IR ANGIO INTRA EXTRACRAN SEL COM CAROTID INNOMINATE UNI R MOD SED  04/25/2021   IR ANGIO INTRA EXTRACRAN SEL INTERNAL CAROTID UNI L MOD SED  04/25/2021   IR ANGIOGRAM FOLLOW UP STUDY  04/25/2021   IR CT HEAD LTD  04/25/2021   IR NEURO EACH ADD'L AFTER BASIC UNI LEFT (MS)  04/25/2021   IR  RADIOLOGIST EVAL & MGMT  03/22/2021   IR RADIOLOGIST EVAL & MGMT  03/28/2021   IR RADIOLOGIST EVAL & MGMT  05/15/2021   IR RADIOLOGIST EVAL & MGMT  09/28/2021   IR TRANSCATH/EMBOLIZ  04/25/2021   IR US  GUIDE VASC ACCESS RIGHT  04/25/2021   MYRINGOTOMY WITH TUBE PLACEMENT Right 02/04/2017   Procedure: REVISION OF RIGHT MYRINGOTOMY WITH TUBE PLACEMENT, WITH EXAM OF LEFT EAR;  Surgeon: Karis Clunes, MD;  Location: [facility];  Service: ENT;  Laterality: Right;   NASAL SINUS SURGERY  2016   polypectomy   OPEN REDUCTION INTERNAL FIXATION (ORIF) TIBIA/FIBULA FRACTURE Left 02/02/2023   Procedure: OPEN REDUCTION INTERNAL FIXATION (ORIF) LEFT TIBIA/FIBULA FRACTURE;  Surgeon: Georgina Ozell LABOR, MD;  Location: [facility];  Service: Orthopedics;  Laterality: Left;   POLYPECTOMY  07/25/2020   Procedure: POLYPECTOMY;  Surgeon: Eartha Flavors Toribio, MD;  [facility];  Service: Gastroenterology;;   POLYPECTOMY  08/29/2020   Procedure: POLYPECTOMY;  Surgeon: Eartha Flavors Toribio, MD;  [facility];  Service: Gastroenterology;;   POLYPECTOMY  07/24/2022   Procedure: POLYPECTOMY;  Surgeon: Eartha Flavors Toribio, MD;  [facility];  Service: Gastroenterology;;   [facility] REMOVAL N/A 01/06/2019   Procedure: REMOVAL PORT-A-CATH;  Surgeon: Gail Favorite, MD;  Location: [facility];  Service: General;  Laterality: N/A;   PORTACATH PLACEMENT Right 06/16/2018   Procedure: INSERTION PORT-A-CATH;  Surgeon: Gail Favorite, MD;  Location: [facility];  Service: General;  Laterality: Right;   RADIOLOGY WITH ANESTHESIA N/A 04/25/2021   Procedure: IR WITH ANESTHESIA EMBOLIZATION;  Surgeon: Dolphus Carrion, MD;  Location: [facility];  Service: Radiology;  Laterality: N/A;   SCLEROTHERAPY  07/24/2022   Procedure: SCLEROTHERAPY;  Surgeon: Eartha Flavors, Toribio, MD;  [facility];  Service: Gastroenterology;;   SUBMUCOSAL LIFTING INJECTION  08/29/2020    Procedure: SUBMUCOSAL LIFTING INJECTION;  Surgeon: Eartha Flavors, Toribio, MD;  [facility];  Service: Gastroenterology;;   SUBMUCOSAL LIFTING INJECTION  07/24/2022   Procedure: SUBMUCOSAL LIFTING INJECTION;  Surgeon: Eartha Flavors Toribio, MD;  [facility];  Service: Gastroenterology;;   TONSILLECTOMY  1970   Patient Active Problem List   Diagnosis Date Noted   Closed fracture of left fibula and tibia 02/02/2023   Acute diarrhea 12/23/2022   Functional dyspepsia 07/01/2022   DOE (dyspnea on exertion) 01/15/2022   Nausea with vomiting 05/28/2021   Duodenal adenoma 05/28/2021   Brain aneurysm 04/25/2021   History of herpes simplex infection 01/08/2021   Papanicolaou smear, as part of routine gynecological examination 01/08/2021   CVA (cerebral vascular accident) (HCC) 01/04/2021   Acute ischemic stroke (HCC) 01/03/2021   Hypokalemia 01/03/2021   Cerebral aneurysm 01/03/2021   Musculoskeletal pain 01/03/2021   Early satiety 11/27/2020   Chronic abdominal pain 07/13/2020   Malignant neoplasm of upper-inner quadrant of left breast  in female, estrogen receptor positive (HCC) 04/16/2018   Essential hypertension 03/21/2015   Hyperlipidemia 03/21/2015   Genital herpes 03/21/2015   Fecal urgency 03/21/2015   GERD (gastroesophageal reflux disease) 03/21/2015    PCP: [name], MD  REFERRING PROVIDER: Georgina Ozell LABOR, MD  REFERRING DIAG: S82.202D,S82.402D (ICD-10-CM) - Closed fracture of left tibia and fibula with routine healing, subsequent encounter   THERAPY DIAG:  Muscle weakness (generalized)  Closed fracture of left tibia and fibula with routine healing, subsequent encounter  Unsteadiness on feet  Impaired functional mobility, balance, gait, and endurance  Rationale for Evaluation and Treatment: Rehabilitation  ONSET DATE: 02/02/2023  SUBJECTIVE:   SUBJECTIVE STATEMENT: Pt states she has been sick for a couple of weeks and just had  a biopsy done yesterday. Pt states the ankle is still bothering her, 4/10 today. Pt reports no falls in the last couple weeks. Pt states that the doctor told her her thyroid  is overactive and that's why she is not feeling as energetic. Pt states she would prefer to continue coming to therapy and is hopeful with more energy more progress will be obtained.  PERTINENT HISTORY: Closed Fx of Left tibia and fibula Parkinson's Disease without Dyskinesia, without mentation of fluctioantios Cerebral Aneurysm PAIN:  Are you having pain? No and Yes: NPRS scale: 4 Pain location: Lt ankle  Pain description: throb Aggravating factors: activitiy  Relieving factors: rest  PRECAUTIONS: Left CAM Boot-can transition to regular after a month in the boot  RED FLAGS: None   WEIGHT BEARING RESTRICTIONS: WBAT on LLE  FALLS:  Has patient fallen in last 6 months? Fall is MOI   PATIENT GOALS: To be able to walk on my own with AD  NEXT MD VISIT: in 3 months  OBJECTIVE:  Note: Objective measures were completed at Evaluation unless otherwise noted.  DIAGNOSTIC FINDINGS:   PATIENT SURVEYS:  LEFS 21/80 = 26.3%  06/24/23: 29/80=  36.3% 08/20/23: LEFS 46/80= 57.7% COGNITION: Overall cognitive status: Within functional limits for tasks assessed     SENSATION: WFL  POSTURE: rounded shoulders and forward head  LOWER EXTREMITY ROM:  Active ROM Right eval Left eval Left 06/24/23: Left 07/24/23 Left 08/20/23   Hip flexion        Hip extension        Hip abduction        Hip adduction        Hip internal rotation        Hip external rotation        Knee flexion        Knee extension        Ankle dorsiflexion 15 A5  P8 A8 I P12 10 2   Ankle plantarflexion   A 8  30 22    Ankle inversion WFL A2  P15 20 10 11    Ankle eversion WFL A10 20 A12 I P 20 11 7     (Blank rows = not tested)  LOWER EXTREMITY MMT:  MMT Right eval Left eval Right  06/24/23 Left 06/24/23 Right 07/24/23 Left 07/24/23  Right 08/20/23 Left 08/20/23  Hip flexion          Hip extension          Hip abduction          Hip adduction          Hip internal rotation          Hip external rotation          Knee  flexion          Knee extension 3+ 4+ 4+ 4 4+ 4, pain 4+ 4  Ankle dorsiflexion 2 4 4  3- 4+ 4-, pain 4+ 4  Ankle plantarflexion 2+ 4 4 3  4+ 4- 4+ 3+  Ankle inversion 2+ 4 4 3  4+ 3 4+ 3+  Ankle eversion 2+ 4 4 3- 4+ 3 4+ 3   (Blank rows = not tested)   FUNCTIONAL TESTS:  30 Second Chair Stand Test: 5x  Norms:   Age 57-64 46-69 87-74 63-79 4-84 81-89 37-94  Women 15 15 14 13 12 11 9   Men 17 16 15 14 13 11 9    TUG: 47.96 seconds   06/24/23: 136ft with SPC in shoe 30 Second Chair Stand Test: 8x no HHA TUG: 24.07 GAIT: Distance walked: 46ft Assistive device utilized: Environmental consultant - 4 wheeled Level of assistance: SBA Comments: stepto pattern with RLE leading and then LLE                                                                                                                                TREATMENT DATE:  08/20/2023  Therapeutic Exercise: -Nustep 5 minutes, level 3 resistance, pt avgs 60 spm -Rocker board 20 reps, DF/PF, INV/EV, pt requires BUE support -Heel raises, 1 set of 20 reps, bilaterally -Forward lunges on bosu ball, 1 set of 7 reps better performance going into LLE, pt cued for core activation and upright posture -Sit to stands, 2 sets of 4 reps, pt cued for core activation    07/29/23:  NuStep, seat 9, 4 min  Gait training, w/o SPC, 30 ftx4, v cues for velocity and step length  Forward marching + backward ambulation, 64ft x4, emphasis on velocity 2nd round   Side Stepping, 30 ftx2 Tandem balance: 30 ea LE, better with RLE forward than LLE SLS: 30 each LE, unable to on L foot Forward Lunge onto 6 inch step for passive DF, 10x ea LE   07/24/2023  Progress note: Strength assessed, ROM assessed, LEFs, pt education on HEP compliance Therapeutic Exercise: -Speed step  ups, 5.5 reps first set, 6 reps second set, 30 second bouts, pt cued for increased speed, requires occasional UE support, 8 inch step -Walking bout with 3lb ankle weights 220 feet, 2 40 foot segments of speed walking, pt requests to walk without the [facility] halfway      PATIENT EDUCATION:  Education details: PT Evaluation, findings, prognosis, frequency, attendance policy, and HEP. Person educated: Patient Education method: Explanation and Demonstration Education comprehension: verbalized understanding  HOME EXERCISE PROGRAM: Access Code: [email or web address] 07/10/23 - Seated Ankle Alphabet  - 1 x daily - 7 x weekly - 1 sets - 1 reps - Ankle Dorsiflexion with Resistance  - 1 x daily - 7 x weekly - 1 sets - 10 reps - 5 hold - Seated Ankle Eversion with Resistance  - 1 x daily - 7 x weekly -  1 sets - 10 reps - 5 hold - Ankle Inversion with Resistance  - 1 x daily - 7 x weekly - 1 sets - 10 reps - 5 hold - Gastroc Stretch on Wall  - 2 x daily - 7 x weekly - 1 sets - 3 reps - 30 hold  URL: [email or web address] Date: 05/16/2023 Prepared by: Omega Bottcher  Exercises - Seated Ankle Dorsiflexion Stretch  - 1 x daily - 7 x weekly - 3 sets - 10 reps - Seated Ankle Pumps  - 1 x daily - 7 x weekly - 3 sets - 20 reps - Seated Ankle Circles  - 1 x daily - 7 x weekly - 3 sets - 26 reps - Sit to Stand with Counter Support  - 1 x daily - 7 x weekly - 3 sets - 10 reps  Access Code: [email or web address] URL: [email or web address] Date: 07/29/2023 Prepared by: Rosaria Powell-Butler  Exercises - Single Leg Stance with Support  - 2 x daily - 7 x weekly - 3 sets - 30 hold  ASSESSMENT:  CLINICAL IMPRESSION: Patient continues to demonstrate decreased LLE strength, decreased gait quality and balance. Patient also demonstrates decreased energy during today's session, hopeful with new diagnosis of thyroid  issue this will resolve. Patient able to continue dynamic balance and core activation exercises  today with lunge variation and ankle mobility activities, good performance with verbal cueing. Pt continues to demonstrates lack in Left ankle strength and ROM compared to the right. Pt does continue to demonstrate ability to walk around clinic with no AD however at decreased speed. Pt has been sick for the past few weeks which has caused her not to be able to come in since earlier in the month. Patient would benefit from additional 3 weeks (2 visits per week) of skilled physical therapy for increased endurance with ambulation, increased LLE strength, and improved balance for improved quality of life, improved independence with gait training and continued progress towards therapy goals.   OBJECTIVE IMPAIRMENTS: Abnormal gait, decreased activity tolerance, decreased balance, decreased mobility, difficulty walking, decreased ROM, decreased strength, hypomobility, postural dysfunction, and pain.   ACTIVITY LIMITATIONS: carrying, lifting, bending, standing, squatting, stairs, transfers, bed mobility, and locomotion level  PARTICIPATION LIMITATIONS: meal prep, driving, community activity, occupation, and yard work  PERSONAL FACTORS: Age and 1-2 comorbidities: Parkinson's are also affecting patient's functional outcome.   REHAB POTENTIAL: Good  CLINICAL DECISION MAKING: Stable/uncomplicated  EVALUATION COMPLEXITY: Low   GOALS: Goals reviewed with patient? Yes  SHORT TERM GOALS: Target date: 07/11/23  Pt will be independent with HEP in order to demonstrate participation in Physical Therapy POC.  Baseline:  06/24/23:  Reports compliance with HEP. Goal status: MET  2.  Pt will 3/10 pain during mobility in order to demonstrate improved pain while performing ADLs.  Baseline: 06/24/23:  Pain scale average 2-8/10 Goal status: MET  LONG TERM GOALS: Target date: 08/14/23  Pt will improve 30 Second Chair Stand Test by at least 2 reps in order to demonstrate improved functional strength to return to  desired activities.  Baseline: see objective.; 06/24/23:  able to complete 8 times no HHA, was 5 initial eval. Goal status: MET  2.  Pt will improve TUG by at least 15 seconds in order to demonstrate improved functional ambulatory capacity in community setting.  Baseline: see objective. 07/14/23:  Improved to 24.07 was 47.96 seconds Goal status: MET  3.  Pt will improve LEFS score by 20% in order to demonstrate improved pain with functional  goals and outcomes. Baseline: see objective. ; 07/24/23: 38/80;  06/24/23: 29/80=  36.3% was LEFS 21/80 = 26.3% Goal status: MET  4.  Pt will improve LLE ankle ROM by  at least 5 degrees in order to improve symmetry during functional activities.. Baseline: see objective.  Goal status: IN PROGRESS  5.  Pt will improve BLE MMT at least 1/2 grade  in order to improve endurance, balance and tolerance during functional activities.. Baseline: see objective.  Goal status: IN PROGRESS   PLAN:  PT FREQUENCY: 2x/week  PT DURATION: 3 weeks  PLANNED INTERVENTIONS: 97164- PT Re-evaluation, 97750- Physical Performance Testing, 97110-Therapeutic exercises, 97530- Therapeutic activity, 97112- Neuromuscular re-education, [phone]- Self Care, 02859- Manual therapy, [phone]- Gait training, [phone]- Electrical stimulation (unattended), [phone]- Electrical stimulation (manual), Patient/Family education, Balance training, Stair training, Joint mobilization, Joint manipulation, Spinal manipulation, Spinal mobilization, DME instructions, Cryotherapy, and Moist heat  PLAN FOR NEXT SESSION: LE strengthening, balance, walking speed, stress HEP compliance and walking regiment  Lang Ada, PT, DPT [facility] Office: [phone] 2:13 PM, 08/20/23

## 2023-08-21 LAB — SURGICAL PATHOLOGY

## 2023-08-22 ENCOUNTER — Inpatient Hospital Stay: Attending: Adult Health | Admitting: Adult Health

## 2023-08-22 DIAGNOSIS — Z170 Estrogen receptor positive status [ER+]: Secondary | ICD-10-CM | POA: Diagnosis not present

## 2023-08-22 DIAGNOSIS — C50212 Malignant neoplasm of upper-inner quadrant of left female breast: Secondary | ICD-10-CM | POA: Diagnosis not present

## 2023-08-22 NOTE — Progress Notes (Signed)
 [facility] Cancer Follow up:    Kimberly Elspeth BRAVO, MD [address] [place] [address]   DIAGNOSIS:  Cancer Staging  Malignant neoplasm of upper-inner quadrant of left breast in female, estrogen receptor positive (HCC) Staging form: Breast, AJCC 8th Edition - Pathologic: Stage IA (pT1c, pN0, cM0, G2, ER+, PR-, HER2-, Oncotype DX score: 30) - Signed by Crawford Morna Pickle, NP on 07/22/2023 Multigene prognostic tests performed: Oncotype DX Recurrence score range: Greater than or equal to 11 Histologic grading system: 3 grade system    SUMMARY OF ONCOLOGIC HISTORY: Oncology History  Malignant neoplasm of upper-inner quadrant of left breast in female, estrogen receptor positive (HCC)  03/24/2018 Initial Diagnosis   Screening detected left breast mass 8 mm upper inner quadrant biopsy revealed grade 2 IDC with DCIS ER 95%, PR 0%, Ki-67 20%, HER-2 -1+ by IHC, T1 BN 0 stage Ia clinical stage   05/06/2018 Surgery   Lumpectomy Warden): IDC with DCIS, 1.8cm, grade 2, ER+ (95%), PR-, HER2 negative (1+, IHC), Ki67 20%, clear margins, 4 SLN negative.    05/18/2018 Oncotype testing   Oncotype DX recurrence score 30: risk of distant recurrence at 9 years is 19%. Chemo benefit is >15%.    06/23/2018 -  Chemotherapy   Adjuvant chemotherapy with dose dense Adriamycin and Cytoxan  x4 followed by Taxol weekly x12   07/22/2023 Cancer Staging   Staging form: Breast, AJCC 8th Edition - Pathologic: Stage IA (pT1c, pN0, cM0, G2, ER+, PR-, HER2-, Oncotype DX score: 30) - Signed by Crawford Morna Pickle, NP on 07/22/2023 Multigene prognostic tests performed: Oncotype DX Recurrence score range: Greater than or equal to 11 Histologic grading system: 3 grade system     CURRENT THERAPY: observation  INTERVAL HISTORY:   [place] 73 y.o. female returns for f/u of her left breast pain.  She underwent biopsy on 7/29 of left breast mass that demonstrated fat necrosis.  She was prescribed  Oxycodone  to help with the pain.  She has taken this about daily, however has not needed this today.   She notes pain in her left arm.  She wants to know if it could be related to the breast or possibly something else.     Patient Active Problem List   Diagnosis Date Noted   Closed fracture of left fibula and tibia 02/02/2023   Acute diarrhea 12/23/2022   Functional dyspepsia 07/01/2022   DOE (dyspnea on exertion) 01/15/2022   Nausea with vomiting 05/28/2021   Duodenal adenoma 05/28/2021   Brain aneurysm 04/25/2021   History of herpes simplex infection 01/08/2021   Papanicolaou smear, as part of routine gynecological examination 01/08/2021   CVA (cerebral vascular accident) (HCC) 01/04/2021   Acute ischemic stroke (HCC) 01/03/2021   Hypokalemia 01/03/2021   Cerebral aneurysm 01/03/2021   Musculoskeletal pain 01/03/2021   Early satiety 11/27/2020   Chronic abdominal pain 07/13/2020   Malignant neoplasm of upper-inner quadrant of left breast in female, estrogen receptor positive (HCC) 04/16/2018   Essential hypertension 03/21/2015   Hyperlipidemia 03/21/2015   Genital herpes 03/21/2015   Fecal urgency 03/21/2015   GERD (gastroesophageal reflux disease) 03/21/2015    is allergic to penicillins, egg-derived products, and other.  MEDICAL HISTORY: Past Medical History:  Diagnosis Date   Aneurysm of left internal carotid artery    Small supraclinoid 5 mm   Breast cancer (HCC)    Left s/p lumpectomy (05/06/2018) and adjuvant chemotherapy (06/23/2018) with Adriamycin and Cytoxan  x4 followed by Taxol weekly  x12   DDD (degenerative disc disease), lumbar    Essential hypertension    Genital warts    GERD (gastroesophageal reflux disease)    History of anemia    History of renal insufficiency    Hypertension    Insomnia    Iron deficiency anemia    Ischemic stroke [facility])    December 2022   Mixed hyperlipidemia    Osteoarthritis    Parkinson's disease (HCC)    Peripheral  neuropathy    Related to chemotherapy   Personal history of chemotherapy 2020   Port-A-Cath in place 06/23/2018    SURGICAL HISTORY: Past Surgical History:  Procedure Laterality Date   APPENDECTOMY  1966   BIOPSY  08/29/2020   Procedure: BIOPSY;  Surgeon: Eartha Angelia Sieving, MD;  [facility];  Service: Gastroenterology;;   BIOPSY  07/24/2022   Procedure: BIOPSY;  Surgeon: Eartha Angelia Sieving, MD;  [facility];  Service: Gastroenterology;;   BREAST BIOPSY Left 08/19/2023   US  LT BREAST BX W LOC DEV 1ST LESION IMG BX SPEC US  GUIDE 08/19/2023 [facility]-ULTRASOUND   BREAST LUMPECTOMY WITH RADIOACTIVE SEED AND SENTINEL LYMPH NODE BIOPSY Left 05/06/2018   Procedure: LEFT BREAST LUMPECTOMY WITH RADIOACTIVE SEED AND LEFT DEEP AXILLARY SENTINEL LYMPH NODE BIOPSY AND BLUE DYE INJECTION;  Surgeon: Gail Favorite, MD;  Location: [facility];  Service: General;  Laterality: Left;   CATARACT EXTRACTION W/PHACO Left 01/10/2014   Procedure: CATARACT EXTRACTION PHACO AND INTRAOCULAR LENS PLACEMENT ; CDE:  4.94;  Surgeon: Dow JULIANNA Burke, MD;  Location: [facility];  Service: Ophthalmology;  Laterality: Left;   CESAREAN SECTION  1986   CHOLECYSTECTOMY     COLONOSCOPY WITH PROPOFOL  N/A 07/25/2020   Procedure: COLONOSCOPY WITH PROPOFOL ;  Surgeon: Eartha Angelia Sieving, MD;  [facility];  Service: Gastroenterology;  Laterality: N/A;  10:55   ESOPHAGOGASTRODUODENOSCOPY (EGD) WITH PROPOFOL  N/A 08/29/2020   Procedure: ESOPHAGOGASTRODUODENOSCOPY (EGD) WITH PROPOFOL ;  Surgeon: Eartha Angelia Sieving, MD;  [facility];  Service: Gastroenterology;  Laterality: N/A;  12:00   ESOPHAGOGASTRODUODENOSCOPY (EGD) WITH PROPOFOL  N/A 07/24/2022   Procedure: ESOPHAGOGASTRODUODENOSCOPY (EGD) WITH PROPOFOL ;  Surgeon: Eartha Angelia Sieving, MD;  [facility];  Service: Gastroenterology;  Laterality: N/A;  12:45;ASA 1-2   HOT HEMOSTASIS  07/24/2022    Procedure: HOT HEMOSTASIS (ARGON PLASMA COAGULATION/BICAP);  Surgeon: Eartha Angelia, Sieving, MD;  [facility];  Service: Gastroenterology;;   IR 3D INDEPENDENT WKST  04/25/2021   IR ANGIO INTRA EXTRACRAN SEL COM CAROTID INNOMINATE UNI R MOD SED  04/25/2021   IR ANGIO INTRA EXTRACRAN SEL INTERNAL CAROTID UNI L MOD SED  04/25/2021   IR ANGIOGRAM FOLLOW UP STUDY  04/25/2021   IR CT HEAD LTD  04/25/2021   IR NEURO EACH ADD'L AFTER BASIC UNI LEFT (MS)  04/25/2021   IR RADIOLOGIST EVAL & MGMT  03/22/2021   IR RADIOLOGIST EVAL & MGMT  03/28/2021   IR RADIOLOGIST EVAL & MGMT  05/15/2021   IR RADIOLOGIST EVAL & MGMT  09/28/2021   IR TRANSCATH/EMBOLIZ  04/25/2021   IR US  GUIDE VASC ACCESS RIGHT  04/25/2021   MYRINGOTOMY WITH TUBE PLACEMENT Right 02/04/2017   Procedure: REVISION OF RIGHT MYRINGOTOMY WITH TUBE PLACEMENT, WITH EXAM OF LEFT EAR;  Surgeon: Karis Clunes, MD;  Location: [facility];  Service: ENT;  Laterality: Right;   NASAL SINUS SURGERY  2016   polypectomy   OPEN REDUCTION INTERNAL FIXATION (ORIF) TIBIA/FIBULA FRACTURE Left  02/02/2023   Procedure: OPEN REDUCTION INTERNAL FIXATION (ORIF) LEFT TIBIA/FIBULA FRACTURE;  Surgeon: Georgina Ozell LABOR, MD;  Location: [facility];  Service: Orthopedics;  Laterality: Left;   POLYPECTOMY  07/25/2020   Procedure: POLYPECTOMY;  Surgeon: Eartha Angelia Sieving, MD;  [facility];  Service: Gastroenterology;;   POLYPECTOMY  08/29/2020   Procedure: POLYPECTOMY;  Surgeon: Eartha Angelia Sieving, MD;  [facility];  Service: Gastroenterology;;   POLYPECTOMY  07/24/2022   Procedure: POLYPECTOMY;  Surgeon: Eartha Angelia Sieving, MD;  [facility];  Service: Gastroenterology;;   [facility] REMOVAL N/A 01/06/2019   Procedure: REMOVAL PORT-A-CATH;  Surgeon: Gail Favorite, MD;  Location: [facility];  Service: General;  Laterality: N/A;   PORTACATH PLACEMENT Right 06/16/2018   Procedure: INSERTION PORT-A-CATH;   Surgeon: Gail Favorite, MD;  Location: [facility];  Service: General;  Laterality: Right;   RADIOLOGY WITH ANESTHESIA N/A 04/25/2021   Procedure: IR WITH ANESTHESIA EMBOLIZATION;  Surgeon: Dolphus Carrion, MD;  Location: [facility];  Service: Radiology;  Laterality: N/A;   SCLEROTHERAPY  07/24/2022   Procedure: SCLEROTHERAPY;  Surgeon: Eartha Angelia, Sieving, MD;  [facility];  Service: Gastroenterology;;   SUBMUCOSAL LIFTING INJECTION  08/29/2020   Procedure: SUBMUCOSAL LIFTING INJECTION;  Surgeon: Eartha Angelia, Sieving, MD;  [facility];  Service: Gastroenterology;;   SUBMUCOSAL LIFTING INJECTION  07/24/2022   Procedure: SUBMUCOSAL LIFTING INJECTION;  Surgeon: Eartha Angelia, Sieving, MD;  [facility];  Service: Gastroenterology;;   TONSILLECTOMY  1970    SOCIAL HISTORY: Social History   Socioeconomic History   Marital status: Legally Separated    Spouse name: Not on file   Number of children: 2   Years of education: Not on file   Highest education level: Not on file  Occupational History    Comment: retired  Tobacco Use   Smoking status: Never   Smokeless tobacco: Never  Vaping Use   Vaping status: Never Used  Substance and Sexual Activity   Alcohol use: No   Drug use: No   Sexual activity: Not Currently    Birth control/protection: Post-menopausal  Other Topics Concern   Not on file  Social History Narrative   One son lives with her   Social Drivers of Health   Financial Resource Strain: Low Risk  (01/08/2021)   Overall Financial Resource Strain (CARDIA)    Difficulty of Paying Living Expenses: Not hard at all  Food Insecurity: No Food Insecurity (02/02/2023)   Hunger Vital Sign    Worried About Running Out of Food in the Last Year: Never true    Ran Out of Food in the Last Year: Never true  Transportation Needs: No Transportation Needs (02/02/2023)   PRAPARE - Administrator, Civil Service (Medical): No     Lack of Transportation (Non-Medical): No  Physical Activity: Inactive (01/08/2021)   Exercise Vital Sign    Days of Exercise per Week: 0 days    Minutes of Exercise per Session: 0 min  Stress: No Stress Concern Present (01/08/2021)   [employer] of Occupational Health - Occupational Stress Questionnaire    Feeling of Stress : Not at all  Social Connections: Moderately Integrated (02/02/2023)   Social Connection and Isolation Panel    Frequency of Communication with Friends and Family: More than three times a week    Frequency of Social Gatherings with Friends and Family: More than three times a week    Attends  Religious Services: More than 4 times per year    Active Member of Clubs or Organizations: Yes    Attends Banker Meetings: More than 4 times per year    Marital Status: Divorced  Intimate Partner Violence: Not At Risk (02/02/2023)   Humiliation, Afraid, Rape, and Kick questionnaire    Fear of Current or Ex-Partner: No    Emotionally Abused: No    Physically Abused: No    Sexually Abused: No    FAMILY HISTORY: Family History  Problem Relation Age of Onset   Lupus Mother    Heart attack Father        age 20   Prostate cancer Father    Hypothyroidism Sister    Breast cancer Sister    Hypertension Sister    Breast cancer Sister    Hypertension Brother    Parkinson's disease Paternal Aunt    Stroke Neg Hx     Review of Systems  Constitutional:  Negative for appetite change, chills, fatigue, fever and unexpected weight change.  HENT:   Negative for hearing loss, lump/mass and trouble swallowing.   Eyes:  Negative for eye problems and icterus.  Respiratory:  Negative for chest tightness, cough and shortness of breath.   Cardiovascular:  Negative for chest pain, leg swelling and palpitations.  Gastrointestinal:  Negative for abdominal distention, abdominal pain, constipation, diarrhea, nausea and vomiting.  Endocrine: Negative for hot flashes.   Genitourinary:  Negative for difficulty urinating.   Musculoskeletal:  Negative for arthralgias.  Skin:  Negative for itching and rash.  Neurological:  Negative for dizziness, extremity weakness, headaches and numbness.  Hematological:  Negative for adenopathy. Does not bruise/bleed easily.  Psychiatric/Behavioral:  Negative for depression. The patient is not nervous/anxious.       PHYSICAL EXAMINATION Patient sounds well, in no apparent distress, mood and behavior are normal.  Speech is normal.      ASSESSMENT and THERAPY PLAN:   Malignant neoplasm of upper-inner quadrant of left breast in female, estrogen receptor positive (HCC) 05/08/2018 lumpectomy Warden): IDC with DCIS, 1.8cm, grade 2, ER+ (95%), PR-, HER2 negative (1+, IHC), Ki67 20%, clear margins, 4 SLN negative. Oncotype DX score 30: Distant recurrence risk at 9 years 19% Treatment plan: 1.  Adjuvant chemotherapy with Taxotere  and Cytoxan  x4 (patient refused) 2.  Adjuvant radiation therapy at [facility] 3.  Followed by adjuvant antiestrogen therapy: (Patient undecided) ----------------------------------------------------------------------------------------------------------------------------------  CT chest abdomen pelvis 02/11/2019: No evidence of metastatic disease.   December 2022: Stroke Hospitalization 04/25/2021-04/27/2021 left internal carotid artery aneurysm embolization   Left breast pain: biopsy shows fat necrosis, no signs of malignancy.  Pain improving, she last took oxycodone  yesterday. Pain should continue to improve post biopsy.  She will call if she is continuing to have issues and we can refer to PT or surgery.   I encouraged her to reach out to her PCP about her left arm pain.  This is not likely related to her left breast pain and biopsy.   RTC in 10/2023 for f/u with Dr. Gudena as scheduled.     The patient was provided an opportunity to ask questions and all were answered. The patient agreed with the plan  and demonstrated an understanding of the instructions.   The patient was advised to call back or seek an in-person evaluation if the symptoms worsen or if the condition fails to improve as anticipated.   I provided 10 minutes of non face-to-face telephone visit time during this encounter, and >  50% was spent counseling as documented under my assessment & plan.   Morna Kendall, NP 08/22/23 3:27 PM Medical Oncology and Hematology [facility] [address] [place], [place] [address] Tel. [phone]    Fax. [phone]  *Total Encounter Time as defined by the Centers for Medicare and Medicaid Services includes, in addition to the face-to-face time of a patient visit (documented in the note above) non-face-to-face time: obtaining and reviewing outside history, ordering and reviewing medications, tests or procedures, care coordination (communications with other health care professionals or caregivers) and documentation in the medical record.

## 2023-08-22 NOTE — Assessment & Plan Note (Addendum)
 05/08/2018 lumpectomy Warden): IDC with DCIS, 1.8cm, grade 2, ER+ (95%), PR-, HER2 negative (1+, IHC), Ki67 20%, clear margins, 4 SLN negative. Oncotype DX score 30: Distant recurrence risk at 9 years 19% Treatment plan: 1.  Adjuvant chemotherapy with Taxotere  and Cytoxan  x4 (patient refused) 2.  Adjuvant radiation therapy at [facility] 3.  Followed by adjuvant antiestrogen therapy: (Patient undecided) ----------------------------------------------------------------------------------------------------------------------------------  CT chest abdomen pelvis 02/11/2019: No evidence of metastatic disease.   December 2022: Stroke Hospitalization 04/25/2021-04/27/2021 left internal carotid artery aneurysm embolization   Left breast pain: biopsy shows fat necrosis, no signs of malignancy.  Pain improving, she last took oxycodone  yesterday. Pain should continue to improve post biopsy.  She will call if she is continuing to have issues and we can refer to PT or surgery.   I encouraged her to reach out to her PCP about her left arm pain.  This is not likely related to her left breast pain and biopsy.   RTC in 10/2023 for f/u with Dr. Gudena as scheduled.

## 2023-08-28 ENCOUNTER — Encounter (HOSPITAL_COMMUNITY): Payer: Self-pay

## 2023-08-28 ENCOUNTER — Ambulatory Visit (HOSPITAL_COMMUNITY): Attending: Adult Health

## 2023-08-28 DIAGNOSIS — R2681 Unsteadiness on feet: Secondary | ICD-10-CM | POA: Diagnosis not present

## 2023-08-28 DIAGNOSIS — Z7409 Other reduced mobility: Secondary | ICD-10-CM | POA: Diagnosis not present

## 2023-08-28 DIAGNOSIS — S82202D Unspecified fracture of shaft of left tibia, subsequent encounter for closed fracture with routine healing: Secondary | ICD-10-CM | POA: Insufficient documentation

## 2023-08-28 DIAGNOSIS — S82402D Unspecified fracture of shaft of left fibula, subsequent encounter for closed fracture with routine healing: Secondary | ICD-10-CM | POA: Diagnosis not present

## 2023-08-28 DIAGNOSIS — M6281 Muscle weakness (generalized): Secondary | ICD-10-CM | POA: Insufficient documentation

## 2023-08-28 NOTE — Therapy (Signed)
 OUTPATIENT PHYSICAL THERAPY LOWER EXTREMITY TREATMENT/PROGRESS NOTE  Progress Note Reporting Period 07/24/23 to 08/20/23  See note below for Objective Data and Assessment of Progress/Goals.       Patient Name: Kimberly Mccormick MRN: [id number] DOB:1950/03/02, 73 y.o., female Today's Date: 08/28/2023  END OF SESSION:  PT End of Session - 08/28/23 1301     Visit Number 20    Number of Visits 24    Date for PT Re-Evaluation 09/04/23    Authorization Type [employer] Dual Complete    Authorization Time Period no auth; no limit    Progress Note Due on Visit 23    PT Start Time 1302    PT Stop Time 1342    PT Time Calculation (min) 40 min    Equipment Utilized During Treatment Gait belt    Activity Tolerance Patient tolerated treatment well    Behavior During Therapy WFL for tasks assessed/performed             Past Medical History:  Diagnosis Date   Aneurysm of left internal carotid artery    Small supraclinoid 5 mm   Breast cancer (HCC)    Left s/p lumpectomy (05/06/2018) and adjuvant chemotherapy (06/23/2018) with Adriamycin and Cytoxan  x4 followed by Taxol weekly x12   DDD (degenerative disc disease), lumbar    Essential hypertension    Genital warts    GERD (gastroesophageal reflux disease)    History of anemia    History of renal insufficiency    Hypertension    Insomnia    Iron deficiency anemia    Ischemic stroke [facility])    December 2022   Mixed hyperlipidemia    Osteoarthritis    Parkinson's disease (HCC)    Peripheral neuropathy    Related to chemotherapy   Personal history of chemotherapy 2020   Port-A-Cath in place 06/23/2018   Past Surgical History:  Procedure Laterality Date   APPENDECTOMY  1966   BIOPSY  08/29/2020   Procedure: BIOPSY;  Surgeon: Eartha Angelia Sieving, MD;  [facility];  Service: Gastroenterology;;   BIOPSY  07/24/2022   Procedure: BIOPSY;  Surgeon: Eartha Angelia Sieving, MD;  [facility];  Service:  Gastroenterology;;   BREAST BIOPSY Left 08/19/2023   US  LT BREAST BX W LOC DEV 1ST LESION IMG BX SPEC US  GUIDE 08/19/2023 [facility]-UL[facility]ASOUND   BREAST LUMPECTOMY WITH RADIOACTIVE SEED AND SENTINEL LYMPH NODE BIOPSY Left 05/06/2018   Procedure: LEFT BREAST LUMPECTOMY WITH RADIOACTIVE SEED AND LEFT DEEP AXILLARY SENTINEL LYMPH NODE BIOPSY AND BLUE DYE INJECTION;  Surgeon: Gail Favorite, MD;  Location: [facility];  Service: General;  Laterality: Left;   CATARACT EXTRACTION W/PHACO Left 01/10/2014   Procedure: CATARACT EXTRACTION PHACO AND INTRAOCULAR LENS PLACEMENT ; CDE:  4.94;  Surgeon: Dow JULIANNA Burke, MD;  Location: [facility];  Service: Ophthalmology;  Laterality: Left;   CESAREAN SECTION  1986   CHOLECYSTECTOMY     COLONOSCOPY WITH PROPOFOL  N/A 07/25/2020   Procedure: COLONOSCOPY WITH PROPOFOL ;  Surgeon: Eartha Angelia Sieving, MD;  [facility];  Service: Gastroenterology;  Laterality: N/A;  10:55   ESOPHAGOGASTRODUODENOSCOPY (EGD) WITH PROPOFOL  N/A 08/29/2020   Procedure: ESOPHAGOGASTRODUODENOSCOPY (EGD) WITH PROPOFOL ;  Surgeon: Eartha Angelia Sieving, MD;  [facility];  Service: Gastroenterology;  Laterality: N/A;  12:00   ESOPHAGOGASTRODUODENOSCOPY (EGD) WITH PROPOFOL  N/A 07/24/2022   Procedure: ESOPHAGOGASTRODUODENOSCOPY (EGD) WITH PROPOFOL ;  Surgeon: Eartha Angelia Sieving, MD;  [facility];  Service: Gastroenterology;  Laterality: N/A;  12:45;ASA 1-2   HOT HEMOSTASIS  07/24/2022   Procedure: HOT HEMOSTASIS (ARGON PLASMA COAGULATION/BICAP);  Surgeon: Eartha Flavors, Toribio, MD;  [facility];  Service: Gastroenterology;;   IR 3D INDEPENDENT WKST  04/25/2021   IR ANGIO INTRA EXTRACRAN SEL COM CAROTID INNOMINATE UNI R MOD SED  04/25/2021   IR ANGIO INTRA EXTRACRAN SEL INTERNAL CAROTID UNI L MOD SED  04/25/2021   IR ANGIOGRAM FOLLOW UP STUDY  04/25/2021   IR CT HEAD LTD  04/25/2021   IR NEURO EACH ADD'L AFTER BASIC UNI LEFT (MS)  04/25/2021   IR  RADIOLOGIST EVAL & MGMT  03/22/2021   IR RADIOLOGIST EVAL & MGMT  03/28/2021   IR RADIOLOGIST EVAL & MGMT  05/15/2021   IR RADIOLOGIST EVAL & MGMT  09/28/2021   IR TRANSCATH/EMBOLIZ  04/25/2021   IR US  GUIDE VASC ACCESS RIGHT  04/25/2021   MYRINGOTOMY WITH TUBE PLACEMENT Right 02/04/2017   Procedure: REVISION OF RIGHT MYRINGOTOMY WITH TUBE PLACEMENT, WITH EXAM OF LEFT EAR;  Surgeon: Karis Clunes, MD;  Location: [facility];  Service: ENT;  Laterality: Right;   NASAL SINUS SURGERY  2016   polypectomy   OPEN REDUCTION INTERNAL FIXATION (ORIF) TIBIA/FIBULA FRACTURE Left 02/02/2023   Procedure: OPEN REDUCTION INTERNAL FIXATION (ORIF) LEFT TIBIA/FIBULA FRACTURE;  Surgeon: Georgina Ozell LABOR, MD;  Location: [facility];  Service: Orthopedics;  Laterality: Left;   POLYPECTOMY  07/25/2020   Procedure: POLYPECTOMY;  Surgeon: Eartha Flavors Toribio, MD;  [facility];  Service: Gastroenterology;;   POLYPECTOMY  08/29/2020   Procedure: POLYPECTOMY;  Surgeon: Eartha Flavors Toribio, MD;  [facility];  Service: Gastroenterology;;   POLYPECTOMY  07/24/2022   Procedure: POLYPECTOMY;  Surgeon: Eartha Flavors Toribio, MD;  [facility];  Service: Gastroenterology;;   [facility] REMOVAL N/A 01/06/2019   Procedure: REMOVAL PORT-A-CATH;  Surgeon: Gail Favorite, MD;  Location: [facility];  Service: General;  Laterality: N/A;   PORTACATH PLACEMENT Right 06/16/2018   Procedure: INSERTION PORT-A-CATH;  Surgeon: Gail Favorite, MD;  Location: [facility];  Service: General;  Laterality: Right;   RADIOLOGY WITH ANESTHESIA N/A 04/25/2021   Procedure: IR WITH ANESTHESIA EMBOLIZATION;  Surgeon: Dolphus Carrion, MD;  Location: [facility];  Service: Radiology;  Laterality: N/A;   SCLEROTHERAPY  07/24/2022   Procedure: SCLEROTHERAPY;  Surgeon: Eartha Flavors, Toribio, MD;  [facility];  Service: Gastroenterology;;   SUBMUCOSAL LIFTING INJECTION  08/29/2020    Procedure: SUBMUCOSAL LIFTING INJECTION;  Surgeon: Eartha Flavors, Toribio, MD;  [facility];  Service: Gastroenterology;;   SUBMUCOSAL LIFTING INJECTION  07/24/2022   Procedure: SUBMUCOSAL LIFTING INJECTION;  Surgeon: Eartha Flavors Toribio, MD;  [facility];  Service: Gastroenterology;;   TONSILLECTOMY  1970   Patient Active Problem List   Diagnosis Date Noted   Closed fracture of left fibula and tibia 02/02/2023   Acute diarrhea 12/23/2022   Functional dyspepsia 07/01/2022   DOE (dyspnea on exertion) 01/15/2022   Nausea with vomiting 05/28/2021   Duodenal adenoma 05/28/2021   Brain aneurysm 04/25/2021   History of herpes simplex infection 01/08/2021   Papanicolaou smear, as part of routine gynecological examination 01/08/2021   CVA (cerebral vascular accident) (HCC) 01/04/2021   Acute ischemic stroke (HCC) 01/03/2021   Hypokalemia 01/03/2021   Cerebral aneurysm 01/03/2021   Musculoskeletal pain 01/03/2021   Early satiety 11/27/2020   Chronic abdominal pain 07/13/2020   Malignant neoplasm of upper-inner quadrant of left breast  in female, estrogen receptor positive (HCC) 04/16/2018   Essential hypertension 03/21/2015   Hyperlipidemia 03/21/2015   Genital herpes 03/21/2015   Fecal urgency 03/21/2015   GERD (gastroesophageal reflux disease) 03/21/2015    PCP: [name], MD  REFERRING PROVIDER: Georgina Ozell LABOR, MD  REFERRING DIAG: S82.202D,S82.402D (ICD-10-CM) - Closed fracture of left tibia and fibula with routine healing, subsequent encounter   THERAPY DIAG:  Muscle weakness (generalized)  Closed fracture of left tibia and fibula with routine healing, subsequent encounter  Unsteadiness on feet  Impaired functional mobility, balance, gait, and endurance  Rationale for Evaluation and Treatment: Rehabilitation  ONSET DATE: 02/02/2023  SUBJECTIVE:   SUBJECTIVE STATEMENT: Pt states her energy is getting better gradually. Pt states she  is not in much pain today.   Progress note: Pt states she has been sick for a couple of weeks and just had a biopsy done yesterday. Pt states the ankle is still bothering her, 4/10 today. Pt reports no falls in the last couple weeks. Pt states that the doctor told her her thyroid  is overactive and that's why she is not feeling as energetic. Pt states she would prefer to continue coming to therapy and is hopeful with more energy more progress will be obtained.  PERTINENT HISTORY: Closed Fx of Left tibia and fibula Parkinson's Disease without Dyskinesia, without mentation of fluctioantios Cerebral Aneurysm PAIN:  Are you having pain? No and Yes: NPRS scale: 4 Pain location: Lt ankle  Pain description: throb Aggravating factors: activitiy  Relieving factors: rest  PRECAUTIONS: Left CAM Boot-can transition to regular after a month in the boot  RED FLAGS: None   WEIGHT BEARING RESTRICTIONS: WBAT on LLE  FALLS:  Has patient fallen in last 6 months? Fall is MOI   PATIENT GOALS: To be able to walk on my own with AD  NEXT MD VISIT: in 3 months  OBJECTIVE:  Note: Objective measures were completed at Evaluation unless otherwise noted.  DIAGNOSTIC FINDINGS:   PATIENT SURVEYS:  LEFS 21/80 = 26.3%  06/24/23: 29/80=  36.3% 08/20/23: LEFS 46/80= 57.7% COGNITION: Overall cognitive status: Within functional limits for tasks assessed     SENSATION: WFL  POSTURE: rounded shoulders and forward head  LOWER EXTREMITY ROM:  Active ROM Right eval Left eval Left 06/24/23: Left 07/24/23 Left 08/20/23   Hip flexion        Hip extension        Hip abduction        Hip adduction        Hip internal rotation        Hip external rotation        Knee flexion        Knee extension        Ankle dorsiflexion 15 A5  P8 A8 I P12 10 2   Ankle plantarflexion   A 8  30 22    Ankle inversion WFL A2  P15 20 10 11    Ankle eversion WFL A10 20 A12 I P 20 11 7     (Blank rows = not  tested)  LOWER EXTREMITY MMT:  MMT Right eval Left eval Right  06/24/23 Left 06/24/23 Right 07/24/23 Left 07/24/23 Right 08/20/23 Left 08/20/23  Hip flexion          Hip extension          Hip abduction          Hip adduction          Hip internal rotation  Hip external rotation          Knee flexion          Knee extension 3+ 4+ 4+ 4 4+ 4, pain 4+ 4  Ankle dorsiflexion 2 4 4  3- 4+ 4-, pain 4+ 4  Ankle plantarflexion 2+ 4 4 3  4+ 4- 4+ 3+  Ankle inversion 2+ 4 4 3  4+ 3 4+ 3+  Ankle eversion 2+ 4 4 3- 4+ 3 4+ 3   (Blank rows = not tested)   FUNCTIONAL TESTS:  30 Second Chair Stand Test: 5x  Norms:   Age 28-64 29-69 47-74 70-79 56-84 47-89 74-94  Women 15 15 14 13 12 11 9   Men 17 16 15 14 13 11 9    TUG: 47.96 seconds   06/24/23: 159ft with SPC in shoe 30 Second Chair Stand Test: 8x no HHA TUG: 24.07 GAIT: Distance walked: 62ft Assistive device utilized: Environmental consultant - 4 wheeled Level of assistance: SBA Comments: stepto pattern with RLE leading and then LLE                                                                                                                                TREATMENT DATE:  08/28/2023  Therapeutic Exercise: -Stationary bike, 5 minutes, level 3 resistance, pt cued for increased pace -Hamstring curls, plate 3, pt cued for eccentric control, 2 sets of 10 reps -Leg press, 3 sets of 10 reps, plate 3, pt cued for eccentric control -Rocker board, LLE only, 20 reps, DF/PF, INV/EV, pt requires BUE support -Sit to stands, 2 sets of 4 reps, pt cued for core activation  Neuromuscular Re-education: -Staggered stance on blue foam with trampoline toss, 3 sets of 5 tossing bouts 3 throws each bout, pt cued for proper foot placement -Step up and overs on blue bosu ball, BUE support, 2 sets of 5 reps, LLE on ball only    08/20/2023  Therapeutic Exercise: -Nustep 5 minutes, level 3 resistance, pt avgs 60 spm -Rocker board 20 reps, DF/PF, INV/EV,  pt requires BUE support -Heel raises, 1 set of 20 reps, bilaterally -Forward lunges on bosu ball, 1 set of 7 reps better performance going into LLE, pt cued for core activation and upright posture -Sit to stands, 2 sets of 4 reps, pt cued for core activation    07/29/23:  NuStep, seat 9, 4 min  Gait training, w/o SPC, 30 ftx4, v cues for velocity and step length  Forward marching + backward ambulation, 51ft x4, emphasis on velocity 2nd round   Side Stepping, 30 ftx2 Tandem balance: 30 ea LE, better with RLE forward than LLE SLS: 30 each LE, unable to on L foot Forward Lunge onto 6 inch step for passive DF, 10x ea LE   PATIENT EDUCATION:  Education details: PT Evaluation, findings, prognosis, frequency, attendance policy, and HEP. Person educated: Patient Education method: Medical illustrator Education comprehension: verbalized understanding  HOME EXERCISE PROGRAM: Access  Code: [email or web address] 07/10/23 - Seated Ankle Alphabet  - 1 x daily - 7 x weekly - 1 sets - 1 reps - Ankle Dorsiflexion with Resistance  - 1 x daily - 7 x weekly - 1 sets - 10 reps - 5 hold - Seated Ankle Eversion with Resistance  - 1 x daily - 7 x weekly - 1 sets - 10 reps - 5 hold - Ankle Inversion with Resistance  - 1 x daily - 7 x weekly - 1 sets - 10 reps - 5 hold - Gastroc Stretch on Wall  - 2 x daily - 7 x weekly - 1 sets - 3 reps - 30 hold  URL: [email or web address] Date: 05/16/2023 Prepared by: Omega Bottcher  Exercises - Seated Ankle Dorsiflexion Stretch  - 1 x daily - 7 x weekly - 3 sets - 10 reps - Seated Ankle Pumps  - 1 x daily - 7 x weekly - 3 sets - 20 reps - Seated Ankle Circles  - 1 x daily - 7 x weekly - 3 sets - 26 reps - Sit to Stand with Counter Support  - 1 x daily - 7 x weekly - 3 sets - 10 reps  Access Code: [email or web address] URL: [email or web address] Date: 07/29/2023 Prepared by: Rosaria Powell-Butler  Exercises - Single Leg Stance with Support  - 2 x  daily - 7 x weekly - 3 sets - 30 hold  ASSESSMENT:  CLINICAL IMPRESSION: Patient continues to demonstrate decreased LLE strength, decreased gait quality/speed and balance. Patient also demonstrates decreased endurance with aerobic based exercise during today's session. Patient able to progress dynamic balance and core activation exercises today with stationary bike and step over variations, good performance with verbal cueing. Patient would continue to benefit from skilled physical therapy for increased endurance with ambulation, increased LLE strength, and improved balance for improved quality of life, improved independence with gait training and continued progress towards therapy goals.   Progress note:  Patient continues to demonstrate decreased LLE strength, decreased gait quality and balance. Patient also demonstrates decreased energy during today's session, hopeful with new diagnosis of thyroid  issue this will resolve. Patient able to continue dynamic balance and core activation exercises today with lunge variation and ankle mobility activities, good performance with verbal cueing. Pt continues to demonstrates lack in Left ankle strength and ROM compared to the right. Pt does continue to demonstrate ability to walk around clinic with no AD however at decreased speed. Pt has been sick for the past few weeks which has caused her not to be able to come in since earlier in the month. Patient would benefit from additional 3 weeks (2 visits per week) of skilled physical therapy for increased endurance with ambulation, increased LLE strength, and improved balance for improved quality of life, improved independence with gait training and continued progress towards therapy goals.   OBJECTIVE IMPAIRMENTS: Abnormal gait, decreased activity tolerance, decreased balance, decreased mobility, difficulty walking, decreased ROM, decreased strength, hypomobility, postural dysfunction, and pain.   ACTIVITY  LIMITATIONS: carrying, lifting, bending, standing, squatting, stairs, transfers, bed mobility, and locomotion level  PARTICIPATION LIMITATIONS: meal prep, driving, community activity, occupation, and yard work  PERSONAL FACTORS: Age and 1-2 comorbidities: Parkinson's are also affecting patient's functional outcome.   REHAB POTENTIAL: Good  CLINICAL DECISION MAKING: Stable/uncomplicated  EVALUATION COMPLEXITY: Low   GOALS: Goals reviewed with patient? Yes  SHORT TERM GOALS: Target date: 07/11/23  Pt will be independent with HEP in order to demonstrate participation in Physical Therapy POC.  Baseline:  06/24/23:  Reports compliance with HEP. Goal status: MET  2.  Pt will 3/10 pain during mobility in order to demonstrate improved pain while performing ADLs.  Baseline: 06/24/23:  Pain scale average 2-8/10 Goal status: MET  LONG TERM GOALS: Target date: 08/14/23  Pt will improve 30 Second Chair Stand Test by at least 2 reps in order to demonstrate improved functional strength to return to desired activities.  Baseline: see objective.; 06/24/23:  able to complete 8 times no HHA, was 5 initial eval. Goal status: MET  2.  Pt will improve TUG by at least 15 seconds in order to demonstrate improved functional ambulatory capacity in community setting.  Baseline: see objective. 07/14/23:  Improved to 24.07 was 47.96 seconds Goal status: MET  3.  Pt will improve LEFS score by 20% in order to demonstrate improved pain with functional goals and outcomes. Baseline: see objective. ; 07/24/23: 38/80;  06/24/23: 29/80=  36.3% was LEFS 21/80 = 26.3% Goal status: MET  4.  Pt will improve LLE ankle ROM by  at least 5 degrees in order to improve symmetry during functional activities.. Baseline: see objective.  Goal status: IN PROGRESS  5.  Pt will improve BLE MMT at least 1/2 grade  in order to improve endurance, balance and tolerance during functional activities.. Baseline: see objective.  Goal status:  IN PROGRESS   PLAN:  PT FREQUENCY: 2x/week  PT DURATION: 3 weeks  PLANNED INTERVENTIONS: 97164- PT Re-evaluation, 97750- Physical Performance Testing, 97110-Therapeutic exercises, 97530- Therapeutic activity, 97112- Neuromuscular re-education, [phone]- Self Care, [id number]- Manual therapy, [phone]- Gait training, [phone]- Electrical stimulation (unattended), [phone]- Electrical stimulation (manual), Patient/Family education, Balance training, Stair training, Joint mobilization, Joint manipulation, Spinal manipulation, Spinal mobilization, DME instructions, Cryotherapy, and Moist heat  PLAN FOR NEXT SESSION: LE strengthening, balance, walking speed, stress HEP compliance and walking regiment  Lang Ada, PT, DPT [facility] Office: [phone] 1:44 PM, 08/28/23

## 2023-09-04 ENCOUNTER — Encounter (HOSPITAL_COMMUNITY): Payer: Self-pay

## 2023-09-04 ENCOUNTER — Ambulatory Visit (HOSPITAL_COMMUNITY)

## 2023-09-04 DIAGNOSIS — M6281 Muscle weakness (generalized): Secondary | ICD-10-CM

## 2023-09-04 DIAGNOSIS — Z7409 Other reduced mobility: Secondary | ICD-10-CM

## 2023-09-04 DIAGNOSIS — R2681 Unsteadiness on feet: Secondary | ICD-10-CM

## 2023-09-04 DIAGNOSIS — S82202D Unspecified fracture of shaft of left tibia, subsequent encounter for closed fracture with routine healing: Secondary | ICD-10-CM

## 2023-09-04 DIAGNOSIS — S82402D Unspecified fracture of shaft of left fibula, subsequent encounter for closed fracture with routine healing: Secondary | ICD-10-CM | POA: Diagnosis not present

## 2023-09-04 NOTE — Therapy (Signed)
 OUTPATIENT PHYSICAL THERAPY LOWER EXTREMITY TREATMENT/PROGRESS NOTE  Progress Note Reporting Period 07/24/23 to 08/20/23  See note below for Objective Data and Assessment of Progress/Goals.       Patient Name: Kimberly Mccormick MRN: [id number] DOB:1950-03-18, 73 y.o., female Today's Date: 09/04/2023  END OF SESSION:  PT End of Session - 09/04/23 1141     Visit Number 21    Number of Visits 24    Date for PT Re-Evaluation 09/12/23    Authorization Type [employer] Dual Complete    Authorization Time Period no auth; no limit    Progress Note Due on Visit 23    PT Start Time 1142    PT Stop Time 1220    PT Time Calculation (min) 38 min    Equipment Utilized During Treatment Gait belt    Activity Tolerance Patient tolerated treatment well    Behavior During Therapy WFL for tasks assessed/performed              Past Medical History:  Diagnosis Date   Aneurysm of left internal carotid artery    Small supraclinoid 5 mm   Breast cancer (HCC)    Left s/p lumpectomy (05/06/2018) and adjuvant chemotherapy (06/23/2018) with Adriamycin and Cytoxan  x4 followed by Taxol weekly x12   DDD (degenerative disc disease), lumbar    Essential hypertension    Genital warts    GERD (gastroesophageal reflux disease)    History of anemia    History of renal insufficiency    Hypertension    Insomnia    Iron deficiency anemia    Ischemic stroke [facility])    December 2022   Mixed hyperlipidemia    Osteoarthritis    Parkinson's disease (HCC)    Peripheral neuropathy    Related to chemotherapy   Personal history of chemotherapy 2020   Port-A-Cath in place 06/23/2018   Past Surgical History:  Procedure Laterality Date   APPENDECTOMY  1966   BIOPSY  08/29/2020   Procedure: BIOPSY;  Surgeon: Eartha Angelia Sieving, MD;  [facility]: [facility];  Service: Gastroenterology;;   BIOPSY  07/24/2022   Procedure: BIOPSY;  Surgeon: Eartha Angelia Sieving, MD;  [facility]: [facility];  Service:  Gastroenterology;;   BREAST BIOPSY Left 08/19/2023   US  LT BREAST BX W LOC DEV 1ST LESION IMG BX SPEC US  GUIDE 08/19/2023 [facility]-ULTRASOUND   BREAST LUMPECTOMY WITH RADIOACTIVE SEED AND SENTINEL LYMPH NODE BIOPSY Left 05/06/2018   Procedure: LEFT BREAST LUMPECTOMY WITH RADIOACTIVE SEED AND LEFT DEEP AXILLARY SENTINEL LYMPH NODE BIOPSY AND BLUE DYE INJECTION;  Surgeon: Gail Favorite, MD;  Location: [facility];  Service: General;  Laterality: Left;   CATARACT EXTRACTION W/PHACO Left 01/10/2014   Procedure: CATARACT EXTRACTION PHACO AND INTRAOCULAR LENS PLACEMENT ; CDE:  4.94;  Surgeon: Dow JULIANNA Burke, MD;  Location: [facility];  Service: Ophthalmology;  Laterality: Left;   CESAREAN SECTION  1986   CHOLECYSTECTOMY     COLONOSCOPY WITH PROPOFOL  N/A 07/25/2020   Procedure: COLONOSCOPY WITH PROPOFOL ;  Surgeon: Eartha Angelia Sieving, MD;  [facility]: [facility];  Service: Gastroenterology;  Laterality: N/A;  10:55   ESOPHAGOGASTRODUODENOSCOPY (EGD) WITH PROPOFOL  N/A 08/29/2020   Procedure: ESOPHAGOGASTRODUODENOSCOPY (EGD) WITH PROPOFOL ;  Surgeon: Eartha Angelia Sieving, MD;  [facility];  Service: Gastroenterology;  Laterality: N/A;  12:00   ESOPHAGOGASTRODUODENOSCOPY (EGD) WITH PROPOFOL  N/A 07/24/2022   Procedure: ESOPHAGOGASTRODUODENOSCOPY (EGD) WITH PROPOFOL ;  Surgeon: Eartha Angelia Sieving, MD;  [facility];  Service: Gastroenterology;  Laterality:  N/A;  12:45;ASA 1-2   HOT HEMOSTASIS  07/24/2022   Procedure: HOT HEMOSTASIS (ARGON PLASMA COAGULATION/BICAP);  Surgeon: Eartha Flavors, Toribio, MD;  [facility]: [facility];  Service: Gastroenterology;;   IR 3D INDEPENDENT WKST  04/25/2021   IR ANGIO INTRA EXTRACRAN SEL COM CAROTID INNOMINATE UNI R MOD SED  04/25/2021   IR ANGIO INTRA EXTRACRAN SEL INTERNAL CAROTID UNI L MOD SED  04/25/2021   IR ANGIOGRAM FOLLOW UP STUDY  04/25/2021   IR CT HEAD LTD  04/25/2021   IR NEURO EACH ADD'L AFTER BASIC UNI LEFT (MS)  04/25/2021   IR  RADIOLOGIST EVAL & MGMT  03/22/2021   IR RADIOLOGIST EVAL & MGMT  03/28/2021   IR RADIOLOGIST EVAL & MGMT  05/15/2021   IR RADIOLOGIST EVAL & MGMT  09/28/2021   IR TRANSCATH/EMBOLIZ  04/25/2021   IR US  GUIDE VASC ACCESS RIGHT  04/25/2021   MYRINGOTOMY WITH TUBE PLACEMENT Right 02/04/2017   Procedure: REVISION OF RIGHT MYRINGOTOMY WITH TUBE PLACEMENT, WITH EXAM OF LEFT EAR;  Surgeon: Karis Clunes, MD;  Location: [facility];  Service: ENT;  Laterality: Right;   NASAL SINUS SURGERY  2016   polypectomy   OPEN REDUCTION INTERNAL FIXATION (ORIF) TIBIA/FIBULA FRACTURE Left 02/02/2023   Procedure: OPEN REDUCTION INTERNAL FIXATION (ORIF) LEFT TIBIA/FIBULA FRACTURE;  Surgeon: Georgina Ozell LABOR, MD;  Location: [facility];  Service: Orthopedics;  Laterality: Left;   POLYPECTOMY  07/25/2020   Procedure: POLYPECTOMY;  Surgeon: Eartha Flavors Toribio, MD;  [facility]: [facility];  Service: Gastroenterology;;   POLYPECTOMY  08/29/2020   Procedure: POLYPECTOMY;  Surgeon: Eartha Flavors Toribio, MD;  [facility];  Service: Gastroenterology;;   POLYPECTOMY  07/24/2022   Procedure: POLYPECTOMY;  Surgeon: Eartha Flavors Toribio, MD;  [facility];  Service: Gastroenterology;;   [facility] REMOVAL N/A 01/06/2019   Procedure: REMOVAL PORT-A-CATH;  Surgeon: Gail Favorite, MD;  Location: [facility];  Service: General;  Laterality: N/A;   PORTACATH PLACEMENT Right 06/16/2018   Procedure: INSERTION PORT-A-CATH;  Surgeon: Gail Favorite, MD;  Location: [facility];  Service: General;  Laterality: Right;   RADIOLOGY WITH ANESTHESIA N/A 04/25/2021   Procedure: IR WITH ANESTHESIA EMBOLIZATION;  Surgeon: Dolphus Carrion, MD;  Location: [facility];  Service: Radiology;  Laterality: N/A;   SCLEROTHERAPY  07/24/2022   Procedure: SCLEROTHERAPY;  Surgeon: Eartha Flavors, Toribio, MD;  [facility];  Service: Gastroenterology;;   SUBMUCOSAL LIFTING INJECTION  08/29/2020    Procedure: SUBMUCOSAL LIFTING INJECTION;  Surgeon: Eartha Flavors, Toribio, MD;  [facility];  Service: Gastroenterology;;   SUBMUCOSAL LIFTING INJECTION  07/24/2022   Procedure: SUBMUCOSAL LIFTING INJECTION;  Surgeon: Eartha Flavors Toribio, MD;  [facility];  Service: Gastroenterology;;   TONSILLECTOMY  1970   Patient Active Problem List   Diagnosis Date Noted   Closed fracture of left fibula and tibia 02/02/2023   Acute diarrhea 12/23/2022   Functional dyspepsia 07/01/2022   DOE (dyspnea on exertion) 01/15/2022   Nausea with vomiting 05/28/2021   Duodenal adenoma 05/28/2021   Brain aneurysm 04/25/2021   History of herpes simplex infection 01/08/2021   Papanicolaou smear, as part of routine gynecological examination 01/08/2021   CVA (cerebral vascular accident) (HCC) 01/04/2021   Acute ischemic stroke (HCC) 01/03/2021   Hypokalemia 01/03/2021   Cerebral aneurysm 01/03/2021   Musculoskeletal pain 01/03/2021   Early satiety 11/27/2020   Chronic abdominal pain 07/13/2020   Malignant neoplasm of upper-inner quadrant of  left breast in female, estrogen receptor positive (HCC) 04/16/2018   Essential hypertension 03/21/2015   Hyperlipidemia 03/21/2015   Genital herpes 03/21/2015   Fecal urgency 03/21/2015   GERD (gastroesophageal reflux disease) 03/21/2015    PCP: [name], MD  REFERRING PROVIDER: Georgina Ozell LABOR, MD  REFERRING DIAG: S82.202D,S82.402D (ICD-10-CM) - Closed fracture of left tibia and fibula with routine healing, subsequent encounter   THERAPY DIAG:  Muscle weakness (generalized)  Closed fracture of left tibia and fibula with routine healing, subsequent encounter  Unsteadiness on feet  Impaired functional mobility, balance, gait, and endurance  Rationale for Evaluation and Treatment: Rehabilitation  ONSET DATE: 02/02/2023  SUBJECTIVE:   SUBJECTIVE STATEMENT: Pt states that she is starting to take iron pills again,  hopeful this will help her energy. Pt states she has not had any pain in LLE in a while. Pt states she walked in her neighbor hood about half a mile which really wore her out on Sunday.   Progress note: Pt states she has been sick for a couple of weeks and just had a biopsy done yesterday. Pt states the ankle is still bothering her, 4/10 today. Pt reports no falls in the last couple weeks. Pt states that the doctor told her her thyroid  is overactive and that's why she is not feeling as energetic. Pt states she would prefer to continue coming to therapy and is hopeful with more energy more progress will be obtained.  PERTINENT HISTORY: Closed Fx of Left tibia and fibula Parkinson's Disease without Dyskinesia, without mentation of fluctioantios Cerebral Aneurysm PAIN:  Are you having pain? No and Yes: NPRS scale: 4 Pain location: Lt ankle  Pain description: throb Aggravating factors: activitiy  Relieving factors: rest  PRECAUTIONS: Left CAM Boot-can transition to regular after a month in the boot  RED FLAGS: None   WEIGHT BEARING RESTRICTIONS: WBAT on LLE  FALLS:  Has patient fallen in last 6 months? Fall is MOI   PATIENT GOALS: To be able to walk on my own with AD  NEXT MD VISIT: in 3 months  OBJECTIVE:  Note: Objective measures were completed at Evaluation unless otherwise noted.  DIAGNOSTIC FINDINGS:   PATIENT SURVEYS:  LEFS 21/80 = 26.3%  06/24/23: 29/80=  36.3% 08/20/23: LEFS 46/80= 57.7% COGNITION: Overall cognitive status: Within functional limits for tasks assessed     SENSATION: WFL  POSTURE: rounded shoulders and forward head  LOWER EXTREMITY ROM:  Active ROM Right eval Left eval Left 06/24/23: Left 07/24/23 Left 08/20/23   Hip flexion        Hip extension        Hip abduction        Hip adduction        Hip internal rotation        Hip external rotation        Knee flexion        Knee extension        Ankle dorsiflexion 15 A5  P8 A8 I P12 10 2    Ankle plantarflexion   A 8  30 22    Ankle inversion WFL A2  P15 20 10 11    Ankle eversion WFL A10 20 A12 I P 20 11 7     (Blank rows = not tested)  LOWER EXTREMITY MMT:  MMT Right eval Left eval Right  06/24/23 Left 06/24/23 Right 07/24/23 Left 07/24/23 Right 08/20/23 Left 08/20/23  Hip flexion          Hip extension  Hip abduction          Hip adduction          Hip internal rotation          Hip external rotation          Knee flexion          Knee extension 3+ 4+ 4+ 4 4+ 4, pain 4+ 4  Ankle dorsiflexion 2 4 4  3- 4+ 4-, pain 4+ 4  Ankle plantarflexion 2+ 4 4 3  4+ 4- 4+ 3+  Ankle inversion 2+ 4 4 3  4+ 3 4+ 3+  Ankle eversion 2+ 4 4 3- 4+ 3 4+ 3   (Blank rows = not tested)   FUNCTIONAL TESTS:  30 Second Chair Stand Test: 5x  Norms:   Age 79-64 32-69 73-74 39-79 86-84 2-89 38-94  Women 15 15 14 13 12 11 9   Men 17 16 15 14 13 11 9    TUG: 47.96 seconds   06/24/23: 16ft with SPC in shoe 30 Second Chair Stand Test: 8x no HHA TUG: 24.07 GAIT: Distance walked: 90ft Assistive device utilized: Environmental consultant - 4 wheeled Level of assistance: SBA Comments: stepto pattern with RLE leading and then LLE                                                                                                                                TREATMENT DATE:  09/04/2023  Therapeutic Exercise: -Stationary bike, 5 minutes, level 4 resistance, pt cued for increased pace -Hamstring curls, plate 4, pt cued for eccentric control, 2 sets of 10 reps -Leg press, 2 sets of 10 reps, plate 4>5, pt cued for eccentric control  Neuromuscular Re-education: -Aeromat walks, 2 laps in parallel bars, each variation, tandem and lateral stepping, pt cued for proper foot placement -STS with trampoline yellow ball toss, 2 sets of 8 reps, first set with narrow BOS and second set with one foot elevated on blue foam for balance  Therapeutic Activity: -Speed step ups, 3 bouts of 30 seconds, 7 reps, 7.5  reps, 8 reps, pt cued for increased speed   08/28/2023  Therapeutic Exercise: -Stationary bike, 5 minutes, level 3 resistance, pt cued for increased pace -Hamstring curls, plate 3, pt cued for eccentric control, 2 sets of 10 reps -Leg press, 3 sets of 10 reps, plate 3, pt cued for eccentric control -Rocker board, LLE only, 20 reps, DF/PF, INV/EV, pt requires BUE support -Sit to stands, 2 sets of 4 reps, pt cued for core activation  Neuromuscular Re-education: -Staggered stance on blue foam with trampoline toss, 3 sets of 5 tossing bouts 3 throws each bout, pt cued for proper foot placement -Step up and overs on blue bosu ball, BUE support, 2 sets of 5 reps, LLE on ball only    08/20/2023  Therapeutic Exercise: -Nustep 5 minutes, level 3 resistance, pt avgs 60 spm -Rocker board 20 reps, DF/PF, INV/EV, pt requires BUE support -  Heel raises, 1 set of 20 reps, bilaterally -Forward lunges on bosu ball, 1 set of 7 reps better performance going into LLE, pt cued for core activation and upright posture -Sit to stands, 2 sets of 4 reps, pt cued for core activation    PATIENT EDUCATION:  Education details: PT Evaluation, findings, prognosis, frequency, attendance policy, and HEP. Person educated: Patient Education method: Explanation and Demonstration Education comprehension: verbalized understanding  HOME EXERCISE PROGRAM: Access Code: [email or web address] 07/10/23 - Seated Ankle Alphabet  - 1 x daily - 7 x weekly - 1 sets - 1 reps - Ankle Dorsiflexion with Resistance  - 1 x daily - 7 x weekly - 1 sets - 10 reps - 5 hold - Seated Ankle Eversion with Resistance  - 1 x daily - 7 x weekly - 1 sets - 10 reps - 5 hold - Ankle Inversion with Resistance  - 1 x daily - 7 x weekly - 1 sets - 10 reps - 5 hold - Gastroc Stretch on Wall  - 2 x daily - 7 x weekly - 1 sets - 3 reps - 30 hold  URL: [email or web address] Date: 05/16/2023 Prepared by: Omega Bottcher  Exercises - Seated Ankle  Dorsiflexion Stretch  - 1 x daily - 7 x weekly - 3 sets - 10 reps - Seated Ankle Pumps  - 1 x daily - 7 x weekly - 3 sets - 20 reps - Seated Ankle Circles  - 1 x daily - 7 x weekly - 3 sets - 26 reps - Sit to Stand with Counter Support  - 1 x daily - 7 x weekly - 3 sets - 10 reps  Access Code: [email or web address] URL: [email or web address] Date: 07/29/2023 Prepared by: Rosaria Powell-Butler  Exercises - Single Leg Stance with Support  - 2 x daily - 7 x weekly - 3 sets - 30 hold  ASSESSMENT:  CLINICAL IMPRESSION: Patient continues to demonstrate decreased pain in LLE, decreased LE strength/power, decreased gait quality and improved balance. Patient also demonstrates decreased speed of most movements with exercise during today's session. Patient able to continue dynamic balance and core activation exercises today with STS variations and aeromat walks with less UE support, good performance with verbal cueing. Patient would continue to benefit from skilled physical therapy for increased endurance with ambulation, increased LE strength/power, and improved balance for improved quality of life, improved independence with gait training and continued progress towards therapy goals.    Progress note:  Patient continues to demonstrate decreased LLE strength, decreased gait quality and balance. Patient also demonstrates decreased energy during today's session, hopeful with new diagnosis of thyroid  issue this will resolve. Patient able to continue dynamic balance and core activation exercises today with lunge variation and ankle mobility activities, good performance with verbal cueing. Pt continues to demonstrates lack in Left ankle strength and ROM compared to the right. Pt does continue to demonstrate ability to walk around clinic with no AD however at decreased speed. Pt has been sick for the past few weeks which has caused her not to be able to come in since earlier in the month. Patient would benefit  from additional 3 weeks (2 visits per week) of skilled physical therapy for increased endurance with ambulation, increased LLE strength, and improved balance for improved quality of life, improved independence with gait training and continued progress towards therapy goals.   OBJECTIVE IMPAIRMENTS: Abnormal gait, decreased activity tolerance, decreased balance, decreased mobility, difficulty walking, decreased ROM, decreased strength, hypomobility, postural dysfunction, and pain.  ACTIVITY LIMITATIONS: carrying, lifting, bending, standing, squatting, stairs, transfers, bed mobility, and locomotion level  PARTICIPATION LIMITATIONS: meal prep, driving, community activity, occupation, and yard work  PERSONAL FACTORS: Age and 1-2 comorbidities: Parkinson's are also affecting patient's functional outcome.   REHAB POTENTIAL: Good  CLINICAL DECISION MAKING: Stable/uncomplicated  EVALUATION COMPLEXITY: Low   GOALS: Goals reviewed with patient? Yes  SHORT TERM GOALS: Target date: 07/11/23  Pt will be independent with HEP in order to demonstrate participation in Physical Therapy POC.  Baseline:  06/24/23:  Reports compliance with HEP. Goal status: MET  2.  Pt will 3/10 pain during mobility in order to demonstrate improved pain while performing ADLs.  Baseline: 06/24/23:  Pain scale average 2-8/10 Goal status: MET  LONG TERM GOALS: Target date: 08/14/23  Pt will improve 30 Second Chair Stand Test by at least 2 reps in order to demonstrate improved functional strength to return to desired activities.  Baseline: see objective.; 06/24/23:  able to complete 8 times no HHA, was 5 initial eval. Goal status: MET  2.  Pt will improve TUG by at least 15 seconds in order to demonstrate improved functional ambulatory capacity in community setting.  Baseline: see objective. 07/14/23:  Improved to 24.07 was 47.96 seconds Goal status: MET  3.  Pt will improve LEFS score by 20% in order to demonstrate  improved pain with functional goals and outcomes. Baseline: see objective. ; 07/24/23: 38/80;  06/24/23: 29/80=  36.3% was LEFS 21/80 = 26.3% Goal status: MET  4.  Pt will improve LLE ankle ROM by  at least 5 degrees in order to improve symmetry during functional activities.. Baseline: see objective.  Goal status: IN PROGRESS  5.  Pt will improve BLE MMT at least 1/2 grade  in order to improve endurance, balance and tolerance during functional activities.. Baseline: see objective.  Goal status: IN PROGRESS   PLAN:  PT FREQUENCY: 2x/week  PT DURATION: 3 weeks  PLANNED INTERVENTIONS: [id number]- PT Re-evaluation, 97750- Physical Performance Testing, 97110-Therapeutic exercises, 97530- Therapeutic activity, [id number]- Neuromuscular re-education, 97535- Self Care, [id number]- Manual therapy, [phone]- Gait training, [phone]- Electrical stimulation (unattended), [phone]- Electrical stimulation (manual), Patient/Family education, Balance training, Stair training, Joint mobilization, Joint manipulation, Spinal manipulation, Spinal mobilization, DME instructions, Cryotherapy, and Moist heat  PLAN FOR NEXT SESSION: LE strengthening, balance, walking speed, stress HEP compliance and walking regiment, add power/speed with LE strengthening and balance activities.  Lang Ada, PT, DPT [facility] Office: [phone] 12:28 PM, 09/04/23

## 2023-09-05 DIAGNOSIS — K30 Functional dyspepsia: Secondary | ICD-10-CM | POA: Diagnosis not present

## 2023-09-05 DIAGNOSIS — S82202A Unspecified fracture of shaft of left tibia, initial encounter for closed fracture: Secondary | ICD-10-CM | POA: Diagnosis not present

## 2023-09-05 DIAGNOSIS — D132 Benign neoplasm of duodenum: Secondary | ICD-10-CM | POA: Diagnosis not present

## 2023-09-17 ENCOUNTER — Ambulatory Visit (HOSPITAL_COMMUNITY)

## 2023-09-17 ENCOUNTER — Encounter (HOSPITAL_COMMUNITY): Payer: Self-pay

## 2023-09-17 DIAGNOSIS — Z7409 Other reduced mobility: Secondary | ICD-10-CM | POA: Diagnosis not present

## 2023-09-17 DIAGNOSIS — M6281 Muscle weakness (generalized): Secondary | ICD-10-CM | POA: Diagnosis not present

## 2023-09-17 DIAGNOSIS — R2681 Unsteadiness on feet: Secondary | ICD-10-CM | POA: Diagnosis not present

## 2023-09-17 DIAGNOSIS — S82402D Unspecified fracture of shaft of left fibula, subsequent encounter for closed fracture with routine healing: Secondary | ICD-10-CM

## 2023-09-17 DIAGNOSIS — S82202D Unspecified fracture of shaft of left tibia, subsequent encounter for closed fracture with routine healing: Secondary | ICD-10-CM | POA: Diagnosis not present

## 2023-09-17 NOTE — Therapy (Signed)
 OUTPATIENT PHYSICAL THERAPY LOWER EXTREMITY TREATMENT/PROGRESS NOTE  Progress Note Reporting Period 08/20/23 to 09/17/23  See note below for Objective Data and Assessment of Progress/Goals.       Patient Name: Kimberly Mccormick MRN: [id number] DOB:[birth year], 73 y.o., female Today's Date: 09/17/2023  END OF SESSION:  PT End of Session - 09/17/23 1346     Visit Number 22    Number of Visits 24    Date for PT Re-Evaluation 09/24/23    Authorization Type [employer] Dual Complete    Authorization Time Period no auth; no limit    PT Start Time 1347    PT Stop Time 1430    PT Time Calculation (min) 43 min    Activity Tolerance Patient tolerated treatment well    Behavior During Therapy WFL for tasks assessed/performed              Past Medical History:  Diagnosis Date   Aneurysm of left internal carotid artery    Small supraclinoid 5 mm   Breast cancer (HCC)    Left s/p lumpectomy (05/06/2018) and adjuvant chemotherapy (06/23/2018) with Adriamycin and Cytoxan  x4 followed by Taxol weekly x12   DDD (degenerative disc disease), lumbar    Essential hypertension    Genital warts    GERD (gastroesophageal reflux disease)    History of anemia    History of renal insufficiency    Hypertension    Insomnia    Iron deficiency anemia    Ischemic stroke [facility])    December 2022   Mixed hyperlipidemia    Osteoarthritis    Parkinson's disease (HCC)    Peripheral neuropathy    Related to chemotherapy   Personal history of chemotherapy 2020   Port-A-Cath in place 06/23/2018   Past Surgical History:  Procedure Laterality Date   APPENDECTOMY  1966   BIOPSY  08/29/2020   Procedure: BIOPSY;  Surgeon: Eartha Angelia Sieving, MD;  [facility];  Service: Gastroenterology;;   BIOPSY  07/24/2022   Procedure: BIOPSY;  Surgeon: Eartha Angelia Sieving, MD;  [facility];  Service: Gastroenterology;;   BREAST BIOPSY Left 08/19/2023   US  LT BREAST BX W LOC DEV 1ST LESION IMG  BX SPEC US  GUIDE 08/19/2023 [facility]-ULTRASOUND   BREAST LUMPECTOMY WITH RADIOACTIVE SEED AND SENTINEL LYMPH NODE BIOPSY Left 05/06/2018   Procedure: LEFT BREAST LUMPECTOMY WITH RADIOACTIVE SEED AND LEFT DEEP AXILLARY SENTINEL LYMPH NODE BIOPSY AND BLUE DYE INJECTION;  Surgeon: Gail Favorite, MD;  Location: [facility];  Service: General;  Laterality: Left;   CATARACT EXTRACTION W/PHACO Left 01/10/2014   Procedure: CATARACT EXTRACTION PHACO AND INTRAOCULAR LENS PLACEMENT ; CDE:  4.94;  Surgeon: Dow JULIANNA Burke, MD;  Location: [facility];  Service: Ophthalmology;  Laterality: Left;   CESAREAN SECTION  1986   CHOLECYSTECTOMY     COLONOSCOPY WITH PROPOFOL  N/A 07/25/2020   Procedure: COLONOSCOPY WITH PROPOFOL ;  Surgeon: Eartha Angelia Sieving, MD;  [facility];  Service: Gastroenterology;  Laterality: N/A;  10:55   ESOPHAGOGASTRODUODENOSCOPY (EGD) WITH PROPOFOL  N/A 08/29/2020   Procedure: ESOPHAGOGASTRODUODENOSCOPY (EGD) WITH PROPOFOL ;  Surgeon: Eartha Angelia Sieving, MD;  [facility];  Service: Gastroenterology;  Laterality: N/A;  12:00   ESOPHAGOGASTRODUODENOSCOPY (EGD) WITH PROPOFOL  N/A 07/24/2022   Procedure: ESOPHAGOGASTRODUODENOSCOPY (EGD) WITH PROPOFOL ;  Surgeon: Eartha Angelia Sieving, MD;  [facility];  Service: Gastroenterology;  Laterality: N/A;  12:45;ASA 1-2   HOT HEMOSTASIS  07/24/2022   Procedure: HOT HEMOSTASIS (ARGON PLASMA COAGULATION/BICAP);  Surgeon: Eartha Flavors, Toribio, MD;  [facility];  Service: Gastroenterology;;   IR 3D INDEPENDENT WKST  04/25/2021   IR ANGIO INTRA EXTRACRAN SEL COM CAROTID INNOMINATE UNI R MOD SED  04/25/2021   IR ANGIO INTRA EXTRACRAN SEL INTERNAL CAROTID UNI L MOD SED  04/25/2021   IR ANGIOGRAM FOLLOW UP STUDY  04/25/2021   IR CT HEAD LTD  04/25/2021   IR NEURO EACH ADD'L AFTER BASIC UNI LEFT (MS)  04/25/2021   IR RADIOLOGIST EVAL & MGMT  03/22/2021   IR RADIOLOGIST EVAL & MGMT  03/28/2021   IR RADIOLOGIST  EVAL & MGMT  05/15/2021   IR RADIOLOGIST EVAL & MGMT  09/28/2021   IR TRANSCATH/EMBOLIZ  04/25/2021   IR US  GUIDE VASC ACCESS RIGHT  04/25/2021   MYRINGOTOMY WITH TUBE PLACEMENT Right 02/04/2017   Procedure: REVISION OF RIGHT MYRINGOTOMY WITH TUBE PLACEMENT, WITH EXAM OF LEFT EAR;  Surgeon: Karis Clunes, MD;  Location: [facility];  Service: ENT;  Laterality: Right;   NASAL SINUS SURGERY  2016   polypectomy   OPEN REDUCTION INTERNAL FIXATION (ORIF) TIBIA/FIBULA FRACTURE Left 02/02/2023   Procedure: OPEN REDUCTION INTERNAL FIXATION (ORIF) LEFT TIBIA/FIBULA FRACTURE;  Surgeon: Georgina Ozell LABOR, MD;  Location: [facility];  Service: Orthopedics;  Laterality: Left;   POLYPECTOMY  07/25/2020   Procedure: POLYPECTOMY;  Surgeon: Eartha Flavors Toribio, MD;  [facility];  Service: Gastroenterology;;   POLYPECTOMY  08/29/2020   Procedure: POLYPECTOMY;  Surgeon: Eartha Flavors Toribio, MD;  [facility];  Service: Gastroenterology;;   POLYPECTOMY  07/24/2022   Procedure: POLYPECTOMY;  Surgeon: Eartha Flavors Toribio, MD;  [facility];  Service: Gastroenterology;;   [facility] REMOVAL N/A 01/06/2019   Procedure: REMOVAL PORT-A-CATH;  Surgeon: Gail Favorite, MD;  Location: [facility];  Service: General;  Laterality: N/A;   PORTACATH PLACEMENT Right 06/16/2018   Procedure: INSERTION PORT-A-CATH;  Surgeon: Gail Favorite, MD;  Location: [facility];  Service: General;  Laterality: Right;   RADIOLOGY WITH ANESTHESIA N/A 04/25/2021   Procedure: IR WITH ANESTHESIA EMBOLIZATION;  Surgeon: Dolphus Carrion, MD;  Location: [facility];  Service: Radiology;  Laterality: N/A;   SCLEROTHERAPY  07/24/2022   Procedure: SCLEROTHERAPY;  Surgeon: Eartha Flavors, Toribio, MD;  [facility];  Service: Gastroenterology;;   SUBMUCOSAL LIFTING INJECTION  08/29/2020   Procedure: SUBMUCOSAL LIFTING INJECTION;  Surgeon: Eartha Flavors, Toribio, MD;   [facility];  Service: Gastroenterology;;   SUBMUCOSAL LIFTING INJECTION  07/24/2022   Procedure: SUBMUCOSAL LIFTING INJECTION;  Surgeon: Eartha Flavors Toribio, MD;  [facility];  Service: Gastroenterology;;   TONSILLECTOMY  1970   Patient Active Problem List   Diagnosis Date Noted   Closed fracture of left fibula and tibia 02/02/2023   Acute diarrhea 12/23/2022   Functional dyspepsia 07/01/2022   DOE (dyspnea on exertion) 01/15/2022   Nausea with vomiting 05/28/2021   Duodenal adenoma 05/28/2021   Brain aneurysm 04/25/2021   History of herpes simplex infection 01/08/2021   Papanicolaou smear, as part of routine gynecological examination 01/08/2021   CVA (cerebral vascular accident) (HCC) 01/04/2021   Acute ischemic stroke (HCC) 01/03/2021   Hypokalemia 01/03/2021   Cerebral aneurysm 01/03/2021   Musculoskeletal pain 01/03/2021   Early satiety 11/27/2020   Chronic abdominal pain 07/13/2020   Malignant neoplasm of upper-inner quadrant of left breast in female, estrogen receptor positive (HCC) 04/16/2018   Essential hypertension 03/21/2015   Hyperlipidemia 03/21/2015  Genital herpes 03/21/2015   Fecal urgency 03/21/2015   GERD (gastroesophageal reflux disease) 03/21/2015    PCP: [name], MD  REFERRING PROVIDER: Georgina Ozell LABOR, MD  REFERRING DIAG: S82.202D,S82.402D (ICD-10-CM) - Closed fracture of left tibia and fibula with routine healing, subsequent encounter   THERAPY DIAG:  Muscle weakness (generalized)  Closed fracture of left tibia and fibula with routine healing, subsequent encounter  Unsteadiness on feet  Impaired functional mobility, balance, gait, and endurance  Rationale for Evaluation and Treatment: Rehabilitation  ONSET DATE: 02/02/2023  SUBJECTIVE:   SUBJECTIVE STATEMENT: Pt reports no pain today in ankle. Reports she feels good. Reports she did start taking iron pills. This is the second week. Reports little to no  change in her energy yet. Reports she has been doing a lot of walking. Walked from her house to her cousins house, ~half of mile. Reports she hasn't tried walking to church yet but does the stairs at church without any issue.    Progress note: Pt states she has been sick for a couple of weeks and just had a biopsy done yesterday. Pt states the ankle is still bothering her, 4/10 today. Pt reports no falls in the last couple weeks. Pt states that the doctor told her her thyroid  is overactive and that's why she is not feeling as energetic. Pt states she would prefer to continue coming to therapy and is hopeful with more energy more progress will be obtained.  PERTINENT HISTORY: Closed Fx of Left tibia and fibula Parkinson's Disease without Dyskinesia, without mentation of fluctioantios Cerebral Aneurysm PAIN:  Are you having pain? No and Yes: NPRS scale: 4 Pain location: Lt ankle  Pain description: throb Aggravating factors: activitiy  Relieving factors: rest  PRECAUTIONS: Left CAM Boot-can transition to regular after a month in the boot  RED FLAGS: None   WEIGHT BEARING RESTRICTIONS: WBAT on LLE  FALLS:  Has patient fallen in last 6 months? Fall is MOI   PATIENT GOALS: To be able to walk on my own with AD  NEXT MD VISIT: in 3 months  OBJECTIVE:  Note: Objective measures were completed at Evaluation unless otherwise noted.  DIAGNOSTIC FINDINGS:   PATIENT SURVEYS:  LEFS 21/80 = 26.3%  06/24/23: 29/80=  36.3% 08/20/23: LEFS 46/80= 57.7%  09/17/23:  Lower Extremity Functional Score: 48 / 80 = 60.0 %  COGNITION: Overall cognitive status: Within functional limits for tasks assessed     SENSATION: WFL  POSTURE: rounded shoulders and forward head  LOWER EXTREMITY ROM:  Active ROM Right eval Left eval Left 06/24/23: Left 07/24/23 Left 08/20/23 Left 09/17/23  Hip flexion        Hip extension        Hip abduction        Hip adduction        Hip internal rotation         Hip external rotation        Knee flexion        Knee extension        Ankle dorsiflexion 15 A5  P8 A8 I P12 10 2 12   Ankle plantarflexion   A 8  30 22 18   Ankle inversion WFL A2  P15 20 10 11 10   Ankle eversion WFL A10 20 A12 I P 20 11 7 15    (Blank rows = not tested)  LOWER EXTREMITY MMT:  MMT Right eval Left eval Right  06/24/23 Left 06/24/23 Right 07/24/23 Left 07/24/23 Right 08/20/23 Left  08/20/23 Left  09/17/23  Hip flexion           Hip extension           Hip abduction           Hip adduction           Hip internal rotation           Hip external rotation           Knee flexion           Knee extension 3+ 4+ 4+ 4 4+ 4, pain 4+ 4   Ankle dorsiflexion 2 4 4  3- 4+ 4-, pain 4+ 4 4  Ankle plantarflexion 2+ 4 4 3  4+ 4- 4+ 3+ 4-  Ankle inversion 2+ 4 4 3  4+ 3 4+ 3+ 4-  Ankle eversion 2+ 4 4 3- 4+ 3 4+ 3 4-   (Blank rows = not tested)   FUNCTIONAL TESTS:  30 Second Chair Stand Test: 5x  Norms:   Age 50-64 40-69 66-74 28-79 52-84 72-89 72-94  Women 15 15 14 13 12 11 9   Men 17 16 15 14 13 11 9    TUG: 47.96 seconds   06/24/23: 158ft with SPC in shoe 30 Second Chair Stand Test: 8x no HHA TUG: 24.07  09/17/23: TUG: 27 without SPC, 22 with SPC Test: 182 ft without SPC 30 second chair stand test: 8.5 STS, no UE assist  GAIT: Distance walked: 61ft Assistive device utilized: Environmental consultant - 4 wheeled Level of assistance: SBA Comments: stepto pattern with RLE leading and then LLE                                                                                                                                TREATMENT DATE:  09/17/23: NuStep, seat 8, level 4, 5 minutes  Progress Note: LEFS Ankle MMT  Ankle ROM TUG 30 second Chair stand test 2 minute walk test  Rocker board, BLE, 20 reps, DF/PF BOSU lunges, 10x each LE, one UE support, verbal cues for upright posture   09/04/2023  Therapeutic Exercise: -Stationary bike, 5 minutes, level 4  resistance, pt cued for increased pace -Hamstring curls, plate 4, pt cued for eccentric control, 2 sets of 10 reps -Leg press, 2 sets of 10 reps, plate 4>5, pt cued for eccentric control Neuromuscular Re-education: -Aeromat walks, 2 laps in parallel bars, each variation, tandem and lateral stepping, pt cued for proper foot placement -STS with trampoline yellow ball toss, 2 sets of 8 reps, first set with narrow BOS and second set with one foot elevated on blue foam for balance Therapeutic Activity: -Speed step ups, 3 bouts of 30 seconds, 7 reps, 7.5 reps, 8 reps, pt cued for increased speed   08/28/2023  Therapeutic Exercise: -Stationary bike, 5 minutes, level 3 resistance, pt cued for increased pace -Hamstring curls, plate 3, pt cued for eccentric control, 2 sets of 10 reps -  Leg press, 3 sets of 10 reps, plate 3, pt cued for eccentric control -Rocker board, LLE only, 20 reps, DF/PF, INV/EV, pt requires BUE support -Sit to stands, 2 sets of 4 reps, pt cued for core activation Neuromuscular Re-education: -Staggered stance on blue foam with trampoline toss, 3 sets of 5 tossing bouts 3 throws each bout, pt cued for proper foot placement -Step up and overs on blue bosu ball, BUE support, 2 sets of 5 reps, LLE on ball only  PATIENT EDUCATION:  Education details: PT Evaluation, findings, prognosis, frequency, attendance policy, and HEP. Person educated: Patient Education method: Explanation and Demonstration Education comprehension: verbalized understanding  HOME EXERCISE PROGRAM: Access Code: [email or web address] 07/10/23 - Seated Ankle Alphabet  - 1 x daily - 7 x weekly - 1 sets - 1 reps - Ankle Dorsiflexion with Resistance  - 1 x daily - 7 x weekly - 1 sets - 10 reps - 5 hold - Seated Ankle Eversion with Resistance  - 1 x daily - 7 x weekly - 1 sets - 10 reps - 5 hold - Ankle Inversion with Resistance  - 1 x daily - 7 x weekly - 1 sets - 10 reps - 5 hold - Gastroc Stretch on Wall  - 2 x daily - 7 x  weekly - 1 sets - 3 reps - 30 hold  URL: [email or web address] Date: 05/16/2023 Prepared by: Omega Bottcher  Exercises - Seated Ankle Dorsiflexion Stretch  - 1 x daily - 7 x weekly - 3 sets - 10 reps - Seated Ankle Pumps  - 1 x daily - 7 x weekly - 3 sets - 20 reps - Seated Ankle Circles  - 1 x daily - 7 x weekly - 3 sets - 26 reps - Sit to Stand with Counter Support  - 1 x daily - 7 x weekly - 3 sets - 10 reps  Access Code: [email or web address] URL: [email or web address] Date: 07/29/2023 Prepared by: Rosaria Powell-Butler  Exercises - Single Leg Stance with Support  - 2 x daily - 7 x weekly - 3 sets - 30 hold  ASSESSMENT:  CLINICAL IMPRESSION: Progress note performed this date. Patient demonstrates good progress with self perceived function via improvements with LEFS, and progress with ankle ROM and strength via MMT. Patient also demonstrates improvement with all functional tests. Of note, most improvements noted with 2 minute walk test, pt increasing her gait speed and also not requiring an AD during test. Patient progressing well towards goals. Ended session with focus on ROM with Rocker board activity as well as lunges onto BOSU ball. Pt tolerated well. Patient would continue to benefit from skilled physical therapy for increased endurance with ambulation, increased LE strength/power, and improved balance for improved quality of life, improved independence with gait training and continued progress towards therapy goals.    Progress note:  Patient continues to demonstrate decreased LLE strength, decreased gait quality and balance. Patient also demonstrates decreased energy during today's session, hopeful with new diagnosis of thyroid  issue this will resolve. Patient able to continue dynamic balance and core activation exercises today with lunge variation and ankle mobility activities, good performance with verbal cueing. Pt continues to demonstrates lack in Left ankle  strength and ROM compared to the right. Pt does continue to demonstrate ability to walk around clinic with no AD however at decreased speed. Pt has been sick for the past few weeks which has caused her not to be able to come in since earlier in the month. Patient would  benefit from additional 3 weeks (2 visits per week) of skilled physical therapy for increased endurance with ambulation, increased LLE strength, and improved balance for improved quality of life, improved independence with gait training and continued progress towards therapy goals.   OBJECTIVE IMPAIRMENTS: Abnormal gait, decreased activity tolerance, decreased balance, decreased mobility, difficulty walking, decreased ROM, decreased strength, hypomobility, postural dysfunction, and pain.   ACTIVITY LIMITATIONS: carrying, lifting, bending, standing, squatting, stairs, transfers, bed mobility, and locomotion level  PARTICIPATION LIMITATIONS: meal prep, driving, community activity, occupation, and yard work  PERSONAL FACTORS: Age and 1-2 comorbidities: Parkinson's are also affecting patient's functional outcome.   REHAB POTENTIAL: Good  CLINICAL DECISION MAKING: Stable/uncomplicated  EVALUATION COMPLEXITY: Low   GOALS: Goals reviewed with patient? Yes  SHORT TERM GOALS: Target date: 07/11/23  Pt will be independent with HEP in order to demonstrate participation in Physical Therapy POC.  Baseline:  06/24/23:  Reports compliance with HEP. Goal status: MET  2.  Pt will 3/10 pain during mobility in order to demonstrate improved pain while performing ADLs.  Baseline: 06/24/23:  Pain scale average 2-8/10 Goal status: MET  LONG TERM GOALS: Target date: 09/24/23  Pt will improve 30 Second Chair Stand Test by at least 2 reps in order to demonstrate improved functional strength to return to desired activities.  Baseline: see objective.; 06/24/23:  able to complete 8 times no HHA, was 5 initial eval. Goal status: MET  2.  Pt will improve  TUG by at least 15 seconds in order to demonstrate improved functional ambulatory capacity in community setting.  Baseline: see objective. 07/14/23:  Improved to 24.07 was 47.96 seconds Goal status: MET  3.  Pt will improve LEFS score by 20% in order to demonstrate improved pain with functional goals and outcomes. Baseline: see objective. ; 07/24/23: 38/80;  06/24/23: 29/80=  36.3% was LEFS 21/80 = 26.3% Goal status: MET  4.  Pt will improve LLE ankle ROM by  at least 5 degrees in order to improve symmetry during functional activities.. Baseline: see objective.  Goal status: IN PROGRESS  5.  Pt will improve BLE MMT at least 1/2 grade  in order to improve endurance, balance and tolerance during functional activities.. Baseline: see objective.  Goal status: IN PROGRESS   PLAN:  PT FREQUENCY: 2x/week  PT DURATION: 3 weeks  PLANNED INTERVENTIONS: [id number]- PT Re-evaluation, 97750- Physical Performance Testing, 97110-Therapeutic exercises, 97530- Therapeutic activity, [id number]- Neuromuscular re-education, 97535- Self Care, [id number]- Manual therapy, [phone]- Gait training, [phone]- Electrical stimulation (unattended), [phone]- Electrical stimulation (manual), Patient/Family education, Balance training, Stair training, Joint mobilization, Joint manipulation, Spinal manipulation, Spinal mobilization, DME instructions, Cryotherapy, and Moist heat  PLAN FOR NEXT SESSION: LE strengthening, balance, walking speed, stress HEP compliance and walking regiment, add power/speed with LE strengthening and balance activities.   2:40 PM, 09/17/23 Rosaria Settler, PT, DPT [facility] [place]

## 2023-09-19 ENCOUNTER — Ambulatory Visit (HOSPITAL_COMMUNITY)

## 2023-09-19 ENCOUNTER — Telehealth (HOSPITAL_COMMUNITY): Payer: Self-pay

## 2023-09-19 NOTE — Telephone Encounter (Signed)
 No show, called and spoke to pt who stated she was on way to rehab. Pt thought apt was at 1:45 not 1:00. Reminded next apt date and time.   Augustin Mclean, LPTA/CLT; WILLAIM [phone]

## 2023-09-19 NOTE — Addendum Note (Signed)
 Addended by: LUDGER HEART E on: 09/19/2023 01:31 PM   Modules accepted: Orders

## 2023-09-22 DIAGNOSIS — I5032 Chronic diastolic (congestive) heart failure: Secondary | ICD-10-CM | POA: Diagnosis not present

## 2023-09-22 DIAGNOSIS — E782 Mixed hyperlipidemia: Secondary | ICD-10-CM | POA: Diagnosis not present

## 2023-09-22 DIAGNOSIS — G20A1 Parkinson's disease without dyskinesia, without mention of fluctuations: Secondary | ICD-10-CM | POA: Diagnosis not present

## 2023-09-24 ENCOUNTER — Ambulatory Visit (HOSPITAL_COMMUNITY): Attending: Adult Health

## 2023-09-24 ENCOUNTER — Encounter (HOSPITAL_COMMUNITY): Payer: Self-pay

## 2023-09-24 DIAGNOSIS — S82402D Unspecified fracture of shaft of left fibula, subsequent encounter for closed fracture with routine healing: Secondary | ICD-10-CM | POA: Insufficient documentation

## 2023-09-24 DIAGNOSIS — R2681 Unsteadiness on feet: Secondary | ICD-10-CM | POA: Diagnosis not present

## 2023-09-24 DIAGNOSIS — M6281 Muscle weakness (generalized): Secondary | ICD-10-CM | POA: Diagnosis not present

## 2023-09-24 DIAGNOSIS — S82202D Unspecified fracture of shaft of left tibia, subsequent encounter for closed fracture with routine healing: Secondary | ICD-10-CM | POA: Diagnosis not present

## 2023-09-24 DIAGNOSIS — Z7409 Other reduced mobility: Secondary | ICD-10-CM | POA: Insufficient documentation

## 2023-09-24 NOTE — Therapy (Signed)
 OUTPATIENT PHYSICAL THERAPY LOWER EXTREMITY TREATMENT   PHYSICAL THERAPY DISCHARGE SUMMARY  Visits from Start of Care: 22  Current functional level related to goals / functional outcomes: WFL   Remaining deficits: Energy, but [facility] for referring diagnosis   Education / Equipment: HEP compliance and normal physical activity   Patient agrees to discharge. Patient goals were met. Patient is being discharged due to meeting the stated rehab goals.      Patient Name: CAMRI MOLLOY MRN: [id number] DOB:13-Jun-1950, 73 y.o., female Today's Date: 09/24/2023  END OF SESSION:  PT End of Session - 09/24/23 1347     Visit Number 23    Number of Visits 24    Date for PT Re-Evaluation 09/24/23    Authorization Type [employer] Dual Complete    Authorization Time Period no auth; no limit    Progress Note Due on Visit 23    PT Start Time 1347    PT Stop Time 1426    PT Time Calculation (min) 39 min    Equipment Utilized During Treatment Gait belt    Activity Tolerance Patient tolerated treatment well    Behavior During Therapy WFL for tasks assessed/performed               Past Medical History:  Diagnosis Date   Aneurysm of left internal carotid artery    Small supraclinoid 5 mm   Breast cancer (HCC)    Left s/p lumpectomy (05/06/2018) and adjuvant chemotherapy (06/23/2018) with Adriamycin and Cytoxan  x4 followed by Taxol weekly x12   DDD (degenerative disc disease), lumbar    Essential hypertension    Genital warts    GERD (gastroesophageal reflux disease)    History of anemia    History of renal insufficiency    Hypertension    Insomnia    Iron deficiency anemia    Ischemic stroke [facility])    December 2022   Mixed hyperlipidemia    Osteoarthritis    Parkinson's disease (HCC)    Peripheral neuropathy    Related to chemotherapy   Personal history of chemotherapy 2020   Port-A-Cath in place 06/23/2018   Past Surgical History:  Procedure Laterality Date   APPENDECTOMY  1966    BIOPSY  08/29/2020   Procedure: BIOPSY;  Surgeon: Eartha Angelia Sieving, MD;  [facility];  Service: Gastroenterology;;   BIOPSY  07/24/2022   Procedure: BIOPSY;  Surgeon: Eartha Angelia Sieving, MD;  [facility];  Service: Gastroenterology;;   BREAST BIOPSY Left 08/19/2023   US  LT BREAST BX W LOC DEV 1ST LESION IMG BX SPEC US  GUIDE 08/19/2023 [facility]-ULTRASOUND   BREAST LUMPECTOMY WITH RADIOACTIVE SEED AND SENTINEL LYMPH NODE BIOPSY Left 05/06/2018   Procedure: LEFT BREAST LUMPECTOMY WITH RADIOACTIVE SEED AND LEFT DEEP AXILLARY SENTINEL LYMPH NODE BIOPSY AND BLUE DYE INJECTION;  Surgeon: Gail Favorite, MD;  Location: [facility];  Service: General;  Laterality: Left;   CATARACT EXTRACTION W/PHACO Left 01/10/2014   Procedure: CATARACT EXTRACTION PHACO AND INTRAOCULAR LENS PLACEMENT ; CDE:  4.94;  Surgeon: Dow JULIANNA Burke, MD;  Location: [facility];  Service: Ophthalmology;  Laterality: Left;   CESAREAN SECTION  1986   CHOLECYSTECTOMY     COLONOSCOPY WITH PROPOFOL  N/A 07/25/2020   Procedure: COLONOSCOPY WITH PROPOFOL ;  Surgeon: Eartha Angelia Sieving, MD;  [facility];  Service: Gastroenterology;  Laterality: N/A;  10:55   ESOPHAGOGASTRODUODENOSCOPY (EGD) WITH PROPOFOL  N/A 08/29/2020   Procedure: ESOPHAGOGASTRODUODENOSCOPY (EGD) WITH PROPOFOL ;  Surgeon: Eartha Angelia,  Toribio, MD;  [facility];  Service: Gastroenterology;  Laterality: N/A;  12:00   ESOPHAGOGASTRODUODENOSCOPY (EGD) WITH PROPOFOL  N/A 07/24/2022   Procedure: ESOPHAGOGASTRODUODENOSCOPY (EGD) WITH PROPOFOL ;  Surgeon: Eartha Angelia Toribio, MD;  [facility];  Service: Gastroenterology;  Laterality: N/A;  12:45;ASA 1-2   HOT HEMOSTASIS  07/24/2022   Procedure: HOT HEMOSTASIS (ARGON PLASMA COAGULATION/BICAP);  Surgeon: Eartha Angelia, Toribio, MD;  [facility];  Service: Gastroenterology;;   IR 3D INDEPENDENT WKST  04/25/2021   IR ANGIO INTRA EXTRACRAN SEL  COM CAROTID INNOMINATE UNI R MOD SED  04/25/2021   IR ANGIO INTRA EXTRACRAN SEL INTERNAL CAROTID UNI L MOD SED  04/25/2021   IR ANGIOGRAM FOLLOW UP STUDY  04/25/2021   IR CT HEAD LTD  04/25/2021   IR NEURO EACH ADD'L AFTER BASIC UNI LEFT (MS)  04/25/2021   IR RADIOLOGIST EVAL & MGMT  03/22/2021   IR RADIOLOGIST EVAL & MGMT  03/28/2021   IR RADIOLOGIST EVAL & MGMT  05/15/2021   IR RADIOLOGIST EVAL & MGMT  09/28/2021   IR TRANSCATH/EMBOLIZ  04/25/2021   IR US  GUIDE VASC ACCESS RIGHT  04/25/2021   MYRINGOTOMY WITH TUBE PLACEMENT Right 02/04/2017   Procedure: REVISION OF RIGHT MYRINGOTOMY WITH TUBE PLACEMENT, WITH EXAM OF LEFT EAR;  Surgeon: Karis Clunes, MD;  Location: [facility];  Service: ENT;  Laterality: Right;   NASAL SINUS SURGERY  2016   polypectomy   OPEN REDUCTION INTERNAL FIXATION (ORIF) TIBIA/FIBULA FRACTURE Left 02/02/2023   Procedure: OPEN REDUCTION INTERNAL FIXATION (ORIF) LEFT TIBIA/FIBULA FRACTURE;  Surgeon: Georgina Ozell LABOR, MD;  Location: [facility];  Service: Orthopedics;  Laterality: Left;   POLYPECTOMY  07/25/2020   Procedure: POLYPECTOMY;  Surgeon: Eartha Angelia Toribio, MD;  [facility];  Service: Gastroenterology;;   POLYPECTOMY  08/29/2020   Procedure: POLYPECTOMY;  Surgeon: Eartha Angelia Toribio, MD;  [facility];  Service: Gastroenterology;;   POLYPECTOMY  07/24/2022   Procedure: POLYPECTOMY;  Surgeon: Eartha Angelia Toribio, MD;  [facility];  Service: Gastroenterology;;   [facility] REMOVAL N/A 01/06/2019   Procedure: REMOVAL PORT-A-CATH;  Surgeon: Gail Favorite, MD;  Location: [facility];  Service: General;  Laterality: N/A;   PORTACATH PLACEMENT Right 06/16/2018   Procedure: INSERTION PORT-A-CATH;  Surgeon: Gail Favorite, MD;  Location: [facility];  Service: General;  Laterality: Right;   RADIOLOGY WITH ANESTHESIA N/A 04/25/2021   Procedure: IR WITH ANESTHESIA EMBOLIZATION;  Surgeon: Dolphus Carrion,  MD;  Location: [facility];  Service: Radiology;  Laterality: N/A;   SCLEROTHERAPY  07/24/2022   Procedure: SCLEROTHERAPY;  Surgeon: Eartha Angelia, Toribio, MD;  [facility];  Service: Gastroenterology;;   SUBMUCOSAL LIFTING INJECTION  08/29/2020   Procedure: SUBMUCOSAL LIFTING INJECTION;  Surgeon: Eartha Angelia Toribio, MD;  [facility];  Service: Gastroenterology;;   SUBMUCOSAL LIFTING INJECTION  07/24/2022   Procedure: SUBMUCOSAL LIFTING INJECTION;  Surgeon: Eartha Angelia Toribio, MD;  [facility];  Service: Gastroenterology;;   TONSILLECTOMY  1970   Patient Active Problem List   Diagnosis Date Noted   Closed fracture of left fibula and tibia 02/02/2023   Acute diarrhea 12/23/2022   Functional dyspepsia 07/01/2022   DOE (dyspnea on exertion) 01/15/2022   Nausea with vomiting 05/28/2021   Duodenal adenoma 05/28/2021   Brain aneurysm 04/25/2021   History of herpes simplex infection 01/08/2021   Papanicolaou smear, as part of routine gynecological examination 01/08/2021  CVA (cerebral vascular accident) (HCC) 01/04/2021   Acute ischemic stroke (HCC) 01/03/2021   Hypokalemia 01/03/2021   Cerebral aneurysm 01/03/2021   Musculoskeletal pain 01/03/2021   Early satiety 11/27/2020   Chronic abdominal pain 07/13/2020   Malignant neoplasm of upper-inner quadrant of left breast in female, estrogen receptor positive (HCC) 04/16/2018   Essential hypertension 03/21/2015   Hyperlipidemia 03/21/2015   Genital herpes 03/21/2015   Fecal urgency 03/21/2015   GERD (gastroesophageal reflux disease) 03/21/2015    PCP: [name], MD  REFERRING PROVIDER: Georgina Ozell LABOR, MD  REFERRING DIAG: S82.202D,S82.402D (ICD-10-CM) - Closed fracture of left tibia and fibula with routine healing, subsequent encounter   THERAPY DIAG:  Muscle weakness (generalized)  Closed fracture of left tibia and fibula with routine healing, subsequent encounter  Unsteadiness  on feet  Impaired functional mobility, balance, gait, and endurance  Rationale for Evaluation and Treatment: Rehabilitation  ONSET DATE: 02/02/2023  SUBJECTIVE:   SUBJECTIVE STATEMENT: Pt continues to report no pain in ankle since last treatment and presents with no compression stockings today. Pt states she feels she has gotten stronger since starting therapy.   Progress note: Pt states she has been sick for a couple of weeks and just had a biopsy done yesterday. Pt states the ankle is still bothering her, 4/10 today. Pt reports no falls in the last couple weeks. Pt states that the doctor told her her thyroid  is overactive and that's why she is not feeling as energetic. Pt states she would prefer to continue coming to therapy and is hopeful with more energy more progress will be obtained.  PERTINENT HISTORY: Closed Fx of Left tibia and fibula Parkinson's Disease without Dyskinesia, without mentation of fluctioantios Cerebral Aneurysm PAIN:  Are you having pain? No and Yes: NPRS scale: 4 Pain location: Lt ankle  Pain description: throb Aggravating factors: activitiy  Relieving factors: rest  PRECAUTIONS: Left CAM Boot-can transition to regular after a month in the boot  RED FLAGS: None   WEIGHT BEARING RESTRICTIONS: WBAT on LLE  FALLS:  Has patient fallen in last 6 months? Fall is MOI   PATIENT GOALS: To be able to walk on my own with AD  NEXT MD VISIT: in 3 months  OBJECTIVE:  Note: Objective measures were completed at Evaluation unless otherwise noted.  DIAGNOSTIC FINDINGS:   PATIENT SURVEYS:  LEFS 21/80 = 26.3%  06/24/23: 29/80=  36.3% 08/20/23: LEFS 46/80= 57.7%  09/17/23:  Lower Extremity Functional Score: 48 / 80 = 60.0 %  COGNITION: Overall cognitive status: Within functional limits for tasks assessed     SENSATION: WFL  POSTURE: rounded shoulders and forward head  LOWER EXTREMITY ROM:  Active ROM Right eval Left eval Left 06/24/23:  Left 07/24/23 Left 08/20/23 Left 09/17/23  Hip flexion        Hip extension        Hip abduction        Hip adduction        Hip internal rotation        Hip external rotation        Knee flexion        Knee extension        Ankle dorsiflexion 15 A5  P8 A8 I P12 10 2 12   Ankle plantarflexion   A 8  30 22 18   Ankle inversion WFL A2  P15 20 10 11 10   Ankle eversion WFL A10 20 A12 I P 20 11 7 15    (Blank rows =  not tested)  LOWER EXTREMITY MMT:  MMT Right eval Left eval Right  06/24/23 Left 06/24/23 Right 07/24/23 Left 07/24/23 Right 08/20/23 Left 08/20/23 Left  09/17/23  Hip flexion           Hip extension           Hip abduction           Hip adduction           Hip internal rotation           Hip external rotation           Knee flexion           Knee extension 3+ 4+ 4+ 4 4+ 4, pain 4+ 4   Ankle dorsiflexion 2 4 4  3- 4+ 4-, pain 4+ 4 4  Ankle plantarflexion 2+ 4 4 3  4+ 4- 4+ 3+ 4-  Ankle inversion 2+ 4 4 3  4+ 3 4+ 3+ 4-  Ankle eversion 2+ 4 4 3- 4+ 3 4+ 3 4-   (Blank rows = not tested)   FUNCTIONAL TESTS:  30 Second Chair Stand Test: 5x  Norms:   Age 41-64 67-69 32-74 95-79 41-84 50-89 69-94  Women 15 15 14 13 12 11 9   Men 17 16 15 14 13 11 9    TUG: 47.96 seconds   06/24/23: 115ft with SPC in shoe 30 Second Chair Stand Test: 8x no HHA TUG: 24.07  09/17/23: TUG: 27 without SPC, 22 with SPC Test: 182 ft without SPC 30 second chair stand test: 8.5 STS, no UE assist  GAIT: Distance walked: 77ft Assistive device utilized: Environmental consultant - 4 wheeled Level of assistance: SBA Comments: stepto pattern with RLE leading and then LLE                                                                                                                                TREATMENT DATE:  09/24/2023  Therapeutic Exercise: -Stationary bike, 5 minutes, level 4 resistance, pt cued for increased pace -Hamstring curls, plate 3> plate 4, pt cued for eccentric control, 2  sets of 10 reps -Leg press, 1 sets of 10 reps, plate 4, pt cued for eccentric control -TKE on 4 inch step, 2 sets of 10 reps, pt cued for decreased UE support and increased ankle activation Neuromuscular Re-education: -Rocker board, 1 sets of 10 reps, DF/PF, pt cued for increased ankle activation and decreased UE support -STS with trampoline yellow ball toss, 2 sets of 5 reps, 5 throws each rep, first set with narrow BOS and second set with one foot elevated on blue foam for balance   09/17/23: NuStep, seat 8, level 4, 5 minutes  Progress Note: LEFS Ankle MMT  Ankle ROM TUG 30 second Chair stand test 2 minute walk test  Rocker board, BLE, 20 reps, DF/PF BOSU lunges, 10x each LE, one UE support, verbal cues for upright posture   09/04/2023  Therapeutic Exercise: -Stationary bike, 5 minutes, level 4 resistance, pt cued for increased pace -Hamstring curls, plate 4, pt cued for eccentric control, 2 sets of 10 reps -Leg press, 2 sets of 10 reps, plate 4>5, pt cued for eccentric control Neuromuscular Re-education: -Aeromat walks, 2 laps in parallel bars, each variation, tandem and lateral stepping, pt cued for proper foot placement -STS with trampoline yellow ball toss, 2 sets of 8 reps, first set with narrow BOS and second set with one foot elevated on blue foam for balance Therapeutic Activity: -Speed step ups, 3 bouts of 30 seconds, 7 reps, 7.5 reps, 8 reps, pt cued for increased speed   PATIENT EDUCATION:  Education details: PT Evaluation, findings, prognosis, frequency, attendance policy, and HEP. Person educated: Patient Education method: Explanation and Demonstration Education comprehension: verbalized understanding  HOME EXERCISE PROGRAM: Access Code: [email or web address] 07/10/23 - Seated Ankle Alphabet  - 1 x daily - 7 x weekly - 1 sets - 1 reps - Ankle Dorsiflexion with Resistance  - 1 x daily - 7 x weekly - 1 sets - 10 reps - 5 hold - Seated Ankle Eversion with Resistance  - 1 x  daily - 7 x weekly - 1 sets - 10 reps - 5 hold - Ankle Inversion with Resistance  - 1 x daily - 7 x weekly - 1 sets - 10 reps - 5 hold - Gastroc Stretch on Wall  - 2 x daily - 7 x weekly - 1 sets - 3 reps - 30 hold  URL: [email or web address] Date: 05/16/2023 Prepared by: Omega Bottcher  Exercises - Seated Ankle Dorsiflexion Stretch  - 1 x daily - 7 x weekly - 3 sets - 10 reps - Seated Ankle Pumps  - 1 x daily - 7 x weekly - 3 sets - 20 reps - Seated Ankle Circles  - 1 x daily - 7 x weekly - 3 sets - 26 reps - Sit to Stand with Counter Support  - 1 x daily - 7 x weekly - 3 sets - 10 reps  Access Code: [email or web address] URL: [email or web address] Date: 07/29/2023 Prepared by: Rosaria Powell-Butler  Exercises - Single Leg Stance with Support  - 2 x daily - 7 x weekly - 3 sets - 30 hold   Access Code: [email or web address] URL: [email or web address] Date: 09/24/2023 Prepared by: Lang Ada  Exercises - Supine Bridge  - 1 x daily - 7 x weekly - 3 sets - 10 reps - Clamshell  - 1 x daily - 7 x weekly - 3 sets - 10 reps - Supine Lower Trunk Rotation  - 1 x daily - 7 x weekly - 3 sets - 10 reps - Supine Piriformis Stretch with Foot on Ground  - 1 x daily - 7 x weekly - 3 sets - 10 reps - Seated Hamstring Stretch  - 1 x daily - 7 x weekly - 3 sets - 10 reps - Gastroc Stretch on Wall  - 1 x daily - 7 x weekly - 3 sets - 10 reps - Supine Active Straight Leg Raise  - 1 x daily - 7 x weekly - 3 sets - 10 reps - Heel Raises with Counter Support  - 1 x daily - 7 x weekly - 3 sets - 10 reps - Heel Toe Raises with Counter Support  - 1 x daily - 7 x weekly - 3 sets - 10 reps ASSESSMENT:  CLINICAL IMPRESSION: Patient has demonstrated great progress since beginning of therapy with meeting all of  therapy goals. Patient does demonstrate decreased energy and endurance with aerobic based exercise but this has been baseline for this pt since about 3 months ago. Patient able to  continue dynamic balance and LLE activation exercises today with trampoline toss and leg strengthening machines, good performance with verbal cueing. Patient to be discharged at this time to independent HEP (revision printed out) due to meeting all of therapy goals.    OBJECTIVE IMPAIRMENTS: Abnormal gait, decreased activity tolerance, decreased balance, decreased mobility, difficulty walking, decreased ROM, decreased strength, hypomobility, postural dysfunction, and pain.   ACTIVITY LIMITATIONS: carrying, lifting, bending, standing, squatting, stairs, transfers, bed mobility, and locomotion level  PARTICIPATION LIMITATIONS: meal prep, driving, community activity, occupation, and yard work  PERSONAL FACTORS: Age and 1-2 comorbidities: Parkinson's are also affecting patient's functional outcome.   REHAB POTENTIAL: Good  CLINICAL DECISION MAKING: Stable/uncomplicated  EVALUATION COMPLEXITY: Low   GOALS: Goals reviewed with patient? Yes  SHORT TERM GOALS: Target date: 07/11/23  Pt will be independent with HEP in order to demonstrate participation in Physical Therapy POC.  Baseline:  06/24/23:  Reports compliance with HEP. Goal status: MET  2.  Pt will 3/10 pain during mobility in order to demonstrate improved pain while performing ADLs.  Baseline: 06/24/23:  Pain scale average 2-8/10 Goal status: MET  LONG TERM GOALS: Target date: 09/24/23  Pt will improve 30 Second Chair Stand Test by at least 2 reps in order to demonstrate improved functional strength to return to desired activities.  Baseline: see objective.; 06/24/23:  able to complete 8 times no HHA, was 5 initial eval. Goal status: MET  2.  Pt will improve TUG by at least 15 seconds in order to demonstrate improved functional ambulatory capacity in community setting.  Baseline: see objective. 07/14/23:  Improved to 24.07 was 47.96 seconds Goal status: MET  3.  Pt will improve LEFS score by 20% in order to demonstrate improved pain  with functional goals and outcomes. Baseline: see objective. ; 07/24/23: 38/80;  06/24/23: 29/80=  36.3% was LEFS 21/80 = 26.3% Goal status: MET  4.  Pt will improve LLE ankle ROM by  at least 5 degrees in order to improve symmetry during functional activities.. Baseline: see objective.  Goal status: MET  5.  Pt will improve BLE MMT at least 1/2 grade  in order to improve endurance, balance and tolerance during functional activities.. Baseline: see objective.  Goal status: MET   PLAN:  PT FREQUENCY: 2x/week  PT DURATION: 3 weeks  PLANNED INTERVENTIONS: 02835- PT Re-evaluation, 97750- Physical Performance Testing, 97110-Therapeutic exercises, 97530- Therapeutic activity, 97112- Neuromuscular re-education, [phone]- Self Care, 02859- Manual therapy, [phone]- Gait training, [phone]- Electrical stimulation (unattended), [phone]- Electrical stimulation (manual), Patient/Family education, Balance training, Stair training, Joint mobilization, Joint manipulation, Spinal manipulation, Spinal mobilization, DME instructions, Cryotherapy, and Moist heat  PLAN FOR NEXT SESSION: Discharged   Lang Ada, PT, DPT [facility] Office: [phone] 3:23 PM, 09/24/23

## 2023-10-06 DIAGNOSIS — S82202A Unspecified fracture of shaft of left tibia, initial encounter for closed fracture: Secondary | ICD-10-CM | POA: Diagnosis not present

## 2023-10-06 DIAGNOSIS — D132 Benign neoplasm of duodenum: Secondary | ICD-10-CM | POA: Diagnosis not present

## 2023-10-06 DIAGNOSIS — K30 Functional dyspepsia: Secondary | ICD-10-CM | POA: Diagnosis not present

## 2023-10-13 DIAGNOSIS — E782 Mixed hyperlipidemia: Secondary | ICD-10-CM | POA: Diagnosis not present

## 2023-10-13 DIAGNOSIS — K219 Gastro-esophageal reflux disease without esophagitis: Secondary | ICD-10-CM | POA: Diagnosis not present

## 2023-10-13 DIAGNOSIS — C50212 Malignant neoplasm of upper-inner quadrant of left female breast: Secondary | ICD-10-CM | POA: Diagnosis not present

## 2023-10-13 DIAGNOSIS — I10 Essential (primary) hypertension: Secondary | ICD-10-CM | POA: Diagnosis not present

## 2023-10-13 DIAGNOSIS — E039 Hypothyroidism, unspecified: Secondary | ICD-10-CM | POA: Diagnosis not present

## 2023-10-13 DIAGNOSIS — G479 Sleep disorder, unspecified: Secondary | ICD-10-CM | POA: Diagnosis not present

## 2023-10-13 DIAGNOSIS — Z170 Estrogen receptor positive status [ER+]: Secondary | ICD-10-CM | POA: Diagnosis not present

## 2023-10-13 DIAGNOSIS — E559 Vitamin D deficiency, unspecified: Secondary | ICD-10-CM | POA: Diagnosis not present

## 2023-10-13 DIAGNOSIS — J309 Allergic rhinitis, unspecified: Secondary | ICD-10-CM | POA: Diagnosis not present

## 2023-10-13 DIAGNOSIS — I6930 Unspecified sequelae of cerebral infarction: Secondary | ICD-10-CM | POA: Diagnosis not present

## 2023-10-13 DIAGNOSIS — M1612 Unilateral primary osteoarthritis, left hip: Secondary | ICD-10-CM | POA: Diagnosis not present

## 2023-10-21 ENCOUNTER — Ambulatory Visit: Admitting: Adult Health

## 2023-10-21 ENCOUNTER — Encounter: Payer: Self-pay | Admitting: Adult Health

## 2023-10-21 ENCOUNTER — Telehealth: Payer: Self-pay | Admitting: Adult Health

## 2023-10-21 NOTE — Telephone Encounter (Signed)
 Patient cancelled appointment due to transportation did not schedule for transportation for her appointment. Patient will call back to reschedule.

## 2023-10-21 NOTE — Progress Notes (Deleted)
 [facility] [address] [place]. [place] [address] [phone]       OFFICE FOLLOW UP NOTE  Ms. [name] Date of Birth:  May 05, 1950 Medical Record Number:  [id number]    Primary neurologist: Dr. Buck Reason for visit: Parkinson's disease    SUBJECTIVE:   CHIEF COMPLAINT:  No chief complaint on file.  Follow-up visit:  Prior visit: 04/09/2023  Brief HPI:   Kimberly Mccormick is a 73 y.o. female who is being followed for left-sided predominant Parkinson's disease.  Initially seen by Dr. Buck 11/2019 for several year history of intermittent left hand tremors and exam showed evidence of mild left-sided parkinsonism.  DaTscan  02/2020 supportive of diagnosis. Hx of right corona radiata infarct 12/2020 with incidental finding of cerebral aneurysm on workup s/p endovascular treatment with flow diverter device 04/2021 by Dr. Dolphus.   At prior visit, reported overall feeling stable on pramipexole  0.75 mg twice daily.  She did undergo ORIF 01/2023 after left distal tibia and fibula fracture post fall     Interval history:  Returns today for follow-up visit.  Reports overall symptoms stable on pramipexole .  Denies any significant improvement of tremors with higher dose but denies any progression since that time.  Tremor can worsen when she is upset or anxious.  She did suffer a left distal tibia and fibula fracture post fall in 01/2023 (slipped on ice/snow) requiring ORIF, she is currently nonweightbearing, has follow-up with Ortho on 3/31 with plans on obtaining a boot and then restarting therapies.  Denies any issues with constipation or swallowing.     ROS:   14 system review of systems performed and negative with exception of those listed in HPI  PMH:  Past Medical History:  Diagnosis Date   Aneurysm of left internal carotid artery    Small supraclinoid 5 mm   Breast cancer (HCC)    Left s/p lumpectomy (05/06/2018) and adjuvant chemotherapy  (06/23/2018) with Adriamycin and Cytoxan  x4 followed by Taxol weekly x12   DDD (degenerative disc disease), lumbar    Essential hypertension    Genital warts    GERD (gastroesophageal reflux disease)    History of anemia    History of renal insufficiency    Hypertension    Insomnia    Iron deficiency anemia    Ischemic stroke [facility])    December 2022   Mixed hyperlipidemia    Osteoarthritis    Parkinson's disease (HCC)    Peripheral neuropathy    Related to chemotherapy   Personal history of chemotherapy 2020   Port-A-Cath in place 06/23/2018    PSH:  Past Surgical History:  Procedure Laterality Date   APPENDECTOMY  1966   BIOPSY  08/29/2020   Procedure: BIOPSY;  Surgeon: Eartha Angelia Sieving, MD;  [facility];  Service: Gastroenterology;;   BIOPSY  07/24/2022   Procedure: BIOPSY;  Surgeon: Eartha Angelia Sieving, MD;  [facility];  Service: Gastroenterology;;   BREAST BIOPSY Left 08/19/2023   US  LT BREAST BX W LOC DEV 1ST LESION IMG BX SPEC US  GUIDE 08/19/2023 [facility]-ULTRASOUND   BREAST LUMPECTOMY WITH RADIOACTIVE SEED AND SENTINEL LYMPH NODE BIOPSY Left 05/06/2018   Procedure: LEFT BREAST LUMPECTOMY WITH RADIOACTIVE SEED AND LEFT DEEP AXILLARY SENTINEL LYMPH NODE BIOPSY AND BLUE DYE INJECTION;  Surgeon: Gail Favorite, MD;  Location: [facility];  Service: General;  Laterality: Left;   CATARACT EXTRACTION W/PHACO Left 01/10/2014   Procedure: CATARACT EXTRACTION PHACO AND INTRAOCULAR LENS PLACEMENT ;  CDE:  4.94;  Surgeon: Dow JULIANNA Burke, MD;  Location: [facility];  Service: Ophthalmology;  Laterality: Left;   CESAREAN SECTION  1986   CHOLECYSTECTOMY     COLONOSCOPY WITH PROPOFOL  N/A 07/25/2020   Procedure: COLONOSCOPY WITH PROPOFOL ;  Surgeon: Eartha Angelia Sieving, MD;  [facility];  Service: Gastroenterology;  Laterality: N/A;  10:55   ESOPHAGOGASTRODUODENOSCOPY (EGD) WITH PROPOFOL  N/A 08/29/2020   Procedure:  ESOPHAGOGASTRODUODENOSCOPY (EGD) WITH PROPOFOL ;  Surgeon: Eartha Angelia Sieving, MD;  [facility];  Service: Gastroenterology;  Laterality: N/A;  12:00   ESOPHAGOGASTRODUODENOSCOPY (EGD) WITH PROPOFOL  N/A 07/24/2022   Procedure: ESOPHAGOGASTRODUODENOSCOPY (EGD) WITH PROPOFOL ;  Surgeon: Eartha Angelia Sieving, MD;  [facility];  Service: Gastroenterology;  Laterality: N/A;  12:45;ASA 1-2   HOT HEMOSTASIS  07/24/2022   Procedure: HOT HEMOSTASIS (ARGON PLASMA COAGULATION/BICAP);  Surgeon: Eartha Angelia, Sieving, MD;  [facility];  Service: Gastroenterology;;   IR 3D INDEPENDENT WKST  04/25/2021   IR ANGIO INTRA EXTRACRAN SEL COM CAROTID INNOMINATE UNI R MOD SED  04/25/2021   IR ANGIO INTRA EXTRACRAN SEL INTERNAL CAROTID UNI L MOD SED  04/25/2021   IR ANGIOGRAM FOLLOW UP STUDY  04/25/2021   IR CT HEAD LTD  04/25/2021   IR NEURO EACH ADD'L AFTER BASIC UNI LEFT (MS)  04/25/2021   IR RADIOLOGIST EVAL & MGMT  03/22/2021   IR RADIOLOGIST EVAL & MGMT  03/28/2021   IR RADIOLOGIST EVAL & MGMT  05/15/2021   IR RADIOLOGIST EVAL & MGMT  09/28/2021   IR TRANSCATH/EMBOLIZ  04/25/2021   IR US  GUIDE VASC ACCESS RIGHT  04/25/2021   MYRINGOTOMY WITH TUBE PLACEMENT Right 02/04/2017   Procedure: REVISION OF RIGHT MYRINGOTOMY WITH TUBE PLACEMENT, WITH EXAM OF LEFT EAR;  Surgeon: Karis Clunes, MD;  Location: [facility];  Service: ENT;  Laterality: Right;   NASAL SINUS SURGERY  2016   polypectomy   OPEN REDUCTION INTERNAL FIXATION (ORIF) TIBIA/FIBULA FRACTURE Left 02/02/2023   Procedure: OPEN REDUCTION INTERNAL FIXATION (ORIF) LEFT TIBIA/FIBULA FRACTURE;  Surgeon: Georgina Ozell LABOR, MD;  Location: [facility];  Service: Orthopedics;  Laterality: Left;   POLYPECTOMY  07/25/2020   Procedure: POLYPECTOMY;  Surgeon: Eartha Angelia Sieving, MD;  [facility];  Service: Gastroenterology;;   POLYPECTOMY  08/29/2020   Procedure: POLYPECTOMY;  Surgeon: Eartha Angelia Sieving, MD;  [facility];  Service: Gastroenterology;;   POLYPECTOMY  07/24/2022   Procedure: POLYPECTOMY;  Surgeon: Eartha Angelia Sieving, MD;  [facility];  Service: Gastroenterology;;   [facility] REMOVAL N/A 01/06/2019   Procedure: REMOVAL PORT-A-CATH;  Surgeon: Gail Favorite, MD;  Location: [facility];  Service: General;  Laterality: N/A;   PORTACATH PLACEMENT Right 06/16/2018   Procedure: INSERTION PORT-A-CATH;  Surgeon: Gail Favorite, MD;  Location: [facility];  Service: General;  Laterality: Right;   RADIOLOGY WITH ANESTHESIA N/A 04/25/2021   Procedure: IR WITH ANESTHESIA EMBOLIZATION;  Surgeon: Dolphus Carrion, MD;  Location: [facility];  Service: Radiology;  Laterality: N/A;   SCLEROTHERAPY  07/24/2022   Procedure: SCLEROTHERAPY;  Surgeon: Eartha Angelia, Sieving, MD;  [facility];  Service: Gastroenterology;;   SUBMUCOSAL LIFTING INJECTION  08/29/2020   Procedure: SUBMUCOSAL LIFTING INJECTION;  Surgeon: Eartha Angelia Sieving, MD;  [facility];  Service: Gastroenterology;;   SUBMUCOSAL LIFTING INJECTION  07/24/2022   Procedure: SUBMUCOSAL LIFTING INJECTION;  Surgeon: Eartha Angelia Sieving, MD;  [facility];  Service:  Gastroenterology;;   TONSILLECTOMY  1970    Social History:  Social History   Socioeconomic History   Marital status: Legally Separated    Spouse name: Not on file   Number of children: 2   Years of education: Not on file   Highest education level: Not on file  Occupational History    Comment: retired  Tobacco Use   Smoking status: Never   Smokeless tobacco: Never  Vaping Use   Vaping status: Never Used  Substance and Sexual Activity   Alcohol use: No   Drug use: No   Sexual activity: Not Currently    Birth control/protection: Post-menopausal  Other Topics Concern   Not on file  Social History Narrative   One son lives with her   Social Drivers of Health   Financial Resource  Strain: Low Risk  (01/08/2021)   Overall Financial Resource Strain (CARDIA)    Difficulty of Paying Living Expenses: Not hard at all  Food Insecurity: No Food Insecurity (02/02/2023)   Hunger Vital Sign    Worried About Running Out of Food in the Last Year: Never true    Ran Out of Food in the Last Year: Never true  Transportation Needs: No Transportation Needs (02/02/2023)   PRAPARE - Administrator, Civil Service (Medical): No    Lack of Transportation (Non-Medical): No  Physical Activity: Inactive (01/08/2021)   Exercise Vital Sign    Days of Exercise per Week: 0 days    Minutes of Exercise per Session: 0 min  Stress: No Stress Concern Present (01/08/2021)   [employer] of Occupational Health - Occupational Stress Questionnaire    Feeling of Stress : Not at all  Social Connections: Moderately Integrated (02/02/2023)   Social Connection and Isolation Panel    Frequency of Communication with Friends and Family: More than three times a week    Frequency of Social Gatherings with Friends and Family: More than three times a week    Attends Religious Services: More than 4 times per year    Active Member of [employer] or Organizations: Yes    Attends Engineer, structural: More than 4 times per year    Marital Status: Divorced  Intimate Partner Violence: Not At Risk (02/02/2023)   Humiliation, Afraid, Rape, and Kick questionnaire    Fear of Current or Ex-Partner: No    Emotionally Abused: No    Physically Abused: No    Sexually Abused: No    Family History:  Family History  Problem Relation Age of Onset   Lupus Mother    Heart attack Father        age 67   Prostate cancer Father    Hypothyroidism Sister    Breast cancer Sister    Hypertension Sister    Breast cancer Sister    Hypertension Brother    Parkinson's disease Paternal Aunt    Stroke Neg Hx     Medications:   Current Outpatient Medications on File Prior to Visit  Medication Sig Dispense Refill    acyclovir  (ZOVIRAX ) 400 MG tablet Take 400 mg by mouth 2 (two) times daily.      aspirin  EC 81 MG tablet Take 81 mg by mouth daily. Swallow whole.     cetirizine (ZYRTEC) 10 MG tablet Take 10 mg by mouth daily as needed for allergies.     Cholecalciferol  50 MCG (2000 UT) TABS Take 50 mcg by mouth daily.     Cyanocobalamin 5000 MCG TBDP Take  5,000 mcg by mouth daily.     cyclobenzaprine (FLEXERIL) 5 MG tablet Take 5 mg by mouth 3 (three) times daily as needed for muscle spasms.     metoprolol  succinate (TOPROL -XL) 25 MG 24 hr tablet Take 25 mg by mouth daily.  3   omeprazole  (PRILOSEC) 20 MG capsule Take 1 capsule (20 mg total) by mouth daily. 90 capsule 3   oxyCODONE  (OXY IR/ROXICODONE ) 5 MG immediate release tablet Take 1 tablet (5 mg total) by mouth 2 (two) times daily as needed for severe pain (pain score 7-10). 10 tablet 0   pramipexole  (MIRAPEX ) 0.75 MG tablet Take 1 tablet (0.75 mg total) by mouth 3 (three) times daily. 270 tablet 3   rosuvastatin  (CRESTOR ) 20 MG tablet Take 20 mg by mouth daily.     sucralfate  (CARAFATE ) 1 GM/10ML suspension Take 10 mLs (1 g total) by mouth every 6 (six) hours as needed. 600 mL 0   ticagrelor  (BRILINTA ) 90 MG TABS tablet Take 90 mg by mouth 2 (two) times daily.     zolpidem  (AMBIEN ) 10 MG tablet Take 5 mg by mouth at bedtime as needed for sleep.     ZTLIDO  1.8 % PTCH Apply 1 patch topically every other day.     No current facility-administered medications on file prior to visit.    Allergies:   Allergies  Allergen Reactions   Penicillins Hives    Has patient had a PCN reaction causing immediate rash, facial/tongue/throat swelling, SOB or lightheadedness with hypotension: No  Has patient had a PCN reaction causing severe rash involving mucus membranes or skin necrosis: No  Has patient had a PCN reaction that required hospitalization: No  Has patient had a PCN reaction occurring within the last 10 years: No  If all of the above answers are  NO, then may proceed with Cephalosporin use.  Has patient had a PCN reaction causing immediate rash, facial/tongue/throat swelling, SOB or lightheadedness with hypotension: No Has patient had a PCN reaction causing severe rash involving mucus membranes or skin necrosis: No Has patient had a PCN reaction that required hospitalization: No Has patient had a PCN reaction occurring within the last 10 years: No If all of the above answers are NO, then may proceed with Cephalosporin use.   Egg-Derived Products Nausea And Vomiting   Other Nausea And Vomiting    Dairy products\       OBJECTIVE:  Physical Exam  There were no vitals filed for this visit.   There is no height or weight on file to calculate BMI. No results found.   General: well developed, well nourished, very pleasant elderly female, seated, in no evident distress  Neurologic Exam Mental Status: Awake and fully alert. Oriented to place and time. Recent and remote memory intact. Attention span, concentration and fund of knowledge appropriate. Mood and affect appropriate.  Mild hypophonia, no dysarthria or voice tremor noted.  Mild to moderate facial masking Cranial Nerves: Pupils equal, briskly reactive to light. Extraocular movements full without nystagmus. Visual fields full to confrontation. Hearing mildly impaired. Facial sensation intact. Face, tongue, palate moves normally and symmetrically.  Motor: Normal strength in all tested extremity muscles except limited testing LLE d/t recent fracture.  Mild resting tremor of LUE and mildly increased tone of left wrist.  Minimal postural tremor of LUE.  Unable to appreciate action or intention tremor bilaterally.  No lower extremity tremor.  Mild to moderate difficulty with finger taps on left side, fairly normal on right side.  Mildly impaired foot tap on left, normal on right. Sensory.: intact to touch , pinprick , position and vibratory sensation.  Gait and Station: deferred,  currently nonweightbearing 2/2 recent LLE fracture Reflexes: 1+ and symmetric. Toes downgoing.         ASSESSMENT/PLAN: Kimberly Mccormick is a 73 y.o. year old female with left-sided predominant Parkinson's disease since 2021. Hx of right corona radiata infarct 12/2020 with incidental finding of cerebral aneurysm s/p flow diverter device 04/2021 with Dr. Dolphus, routinely followed by Dr. Dolphus.    1.  Parkinson disease  -overall stable since prior visit -Continue pramipexole  0.75mg  BID -Next step would be initiating carbidopa levodopa therapy but she declines interest at this time -Continue routine exercise and ensure adequate hydration  2. LLE fracture  -Tibia and fibula fracture s/p ORIF 01/2023, remains nonweightbearing, is scheduled for Ortho follow-up 3/31 with hopes of being put in a walking boot and restart therapies     Follow up in 6 months or call earlier if needed     CC:  PCP: Burdine, Elspeth BRAVO, MD    I personally spent a total of *** minutes in the care of the patient today including {Time Based Coding:[id number]}.   Harlene Bogaert, AGNP-BC  [facility] [address] [place], [place] [address]  Phone [phone] Fax [phone] Note: This document was prepared with digital dictation and possible smart phrase technology. Any transcriptional errors that result from this process are unintentional.

## 2023-10-30 ENCOUNTER — Ambulatory Visit: Payer: 59 | Admitting: Hematology and Oncology

## 2023-11-03 ENCOUNTER — Inpatient Hospital Stay: Attending: Adult Health | Admitting: Hematology and Oncology

## 2023-11-03 VITALS — BP 156/77 | HR 62 | Temp 97.8°F | Resp 18 | Wt 135.2 lb

## 2023-11-03 DIAGNOSIS — Z803 Family history of malignant neoplasm of breast: Secondary | ICD-10-CM | POA: Diagnosis not present

## 2023-11-03 DIAGNOSIS — Z7982 Long term (current) use of aspirin: Secondary | ICD-10-CM | POA: Insufficient documentation

## 2023-11-03 DIAGNOSIS — Z9221 Personal history of antineoplastic chemotherapy: Secondary | ICD-10-CM | POA: Insufficient documentation

## 2023-11-03 DIAGNOSIS — Z923 Personal history of irradiation: Secondary | ICD-10-CM | POA: Diagnosis not present

## 2023-11-03 DIAGNOSIS — Z8673 Personal history of transient ischemic attack (TIA), and cerebral infarction without residual deficits: Secondary | ICD-10-CM | POA: Insufficient documentation

## 2023-11-03 DIAGNOSIS — Z853 Personal history of malignant neoplasm of breast: Secondary | ICD-10-CM | POA: Insufficient documentation

## 2023-11-03 DIAGNOSIS — N641 Fat necrosis of breast: Secondary | ICD-10-CM | POA: Insufficient documentation

## 2023-11-03 DIAGNOSIS — Z170 Estrogen receptor positive status [ER+]: Secondary | ICD-10-CM

## 2023-11-03 DIAGNOSIS — C50212 Malignant neoplasm of upper-inner quadrant of left female breast: Secondary | ICD-10-CM

## 2023-11-03 NOTE — Assessment & Plan Note (Signed)
 05/08/2018 lumpectomy Warden): IDC with DCIS, 1.8cm, grade 2, ER+ (95%), PR-, HER2 negative (1+, IHC), Ki67 20%, clear margins, 4 SLN negative. Oncotype DX score 30: Distant recurrence risk at 9 years 19% Treatment plan: 1.  Adjuvant chemotherapy with Taxotere  and Cytoxan  x4 (patient refused) 2.  Adjuvant radiation therapy at [facility] 3.  Followed by adjuvant antiestrogen therapy: (Patient undecided) ----------------------------------------------------------------------------------------------------------------------------------  CT chest abdomen pelvis 02/11/2019: No evidence of metastatic disease.   December 2022: Stroke Hospitalization 04/25/2021-04/27/2021 left internal carotid artery aneurysm embolization   Left breast pain: 08/19/2023: Biopsy shows fat necrosis, no signs of malignancy.  Pain improving   RTC in 1 year for follow-up

## 2023-11-03 NOTE — Progress Notes (Signed)
 Patient Care Team: [name], MD as PCP - General Debera Jayson MATSU, MD as PCP - Cardiology (Cardiology) Odean Potts, MD as Consulting Physician (Hematology and Oncology)  DIAGNOSIS:  Encounter Diagnosis  Name Primary?   Malignant neoplasm of upper-inner quadrant of left breast in female, estrogen receptor positive (HCC) Yes    SUMMARY OF ONCOLOGIC HISTORY: Oncology History  Malignant neoplasm of upper-inner quadrant of left breast in female, estrogen receptor positive (HCC)  03/24/2018 Initial Diagnosis   Screening detected left breast mass 8 mm upper inner quadrant biopsy revealed grade 2 IDC with DCIS ER 95%, PR 0%, Ki-67 20%, HER-2 -1+ by IHC, T1 BN 0 stage Ia clinical stage   05/06/2018 Surgery   Lumpectomy Warden): IDC with DCIS, 1.8cm, grade 2, ER+ (95%), PR-, HER2 negative (1+, IHC), Ki67 20%, clear margins, 4 SLN negative.    05/18/2018 Oncotype testing   Oncotype DX recurrence score 30: risk of distant recurrence at 9 years is 19%. Chemo benefit is >15%.    06/23/2018 -  Chemotherapy   Adjuvant chemotherapy with dose dense Adriamycin and Cytoxan  x4 followed by Taxol weekly x12   07/22/2023 Cancer Staging   Staging form: Breast, AJCC 8th Edition - Pathologic: Stage IA (pT1c, pN0, cM0, G2, ER+, PR-, HER2-, Oncotype DX score: 30) - Signed by Crawford Morna Pickle, NP on 07/22/2023 Multigene prognostic tests performed: Oncotype DX Recurrence score range: Greater than or equal to 11 Histologic grading system: 3 grade system     CHIEF COMPLIANT: Surveillance of breast cancer  HISTORY OF PRESENT ILLNESS:   History of Present Illness Kimberly Mccormick is a 73 year old female with a history of breast cancer who presents with breast pain.  She experiences intermittent aching breast pain, particularly at night or when reaching for something. The pain varies in intensity. A previous biopsy indicated fat necrosis. Her last mammogram was conducted on July 22.      ALLERGIES:  is allergic to penicillins, egg protein-containing drug products, and other.  MEDICATIONS:  Current Outpatient Medications  Medication Sig Dispense Refill   acyclovir  (ZOVIRAX ) 400 MG tablet Take 400 mg by mouth 2 (two) times daily.      aspirin  EC 81 MG tablet Take 81 mg by mouth daily. Swallow whole.     cetirizine (ZYRTEC) 10 MG tablet Take 10 mg by mouth daily as needed for allergies.     Cholecalciferol  50 MCG (2000 UT) TABS Take 50 mcg by mouth daily.     Cyanocobalamin 5000 MCG TBDP Take 5,000 mcg by mouth daily.     metoprolol  succinate (TOPROL -XL) 25 MG 24 hr tablet Take 25 mg by mouth daily.  3   omeprazole  (PRILOSEC) 20 MG capsule Take 1 capsule (20 mg total) by mouth daily. 90 capsule 3   oxyCODONE  (OXY IR/ROXICODONE ) 5 MG immediate release tablet Take 1 tablet (5 mg total) by mouth 2 (two) times daily as needed for severe pain (pain score 7-10). 10 tablet 0   pramipexole  (MIRAPEX ) 0.75 MG tablet Take 1 tablet (0.75 mg total) by mouth 3 (three) times daily. 270 tablet 3   rosuvastatin  (CRESTOR ) 20 MG tablet Take 20 mg by mouth daily.     zolpidem  (AMBIEN ) 10 MG tablet Take 5 mg by mouth at bedtime as needed for sleep.     ZTLIDO  1.8 % PTCH Apply 1 patch topically every other day.     No current facility-administered medications for this visit.    PHYSICAL EXAMINATION: ECOG PERFORMANCE  STATUS: 1 - Symptomatic but completely ambulatory  Vitals:   11/03/23 1506  BP: (!) 156/77  Pulse: 62  Resp: 18  Temp: 97.8 F (36.6 C)  SpO2: 100%   Filed Weights   11/03/23 1506  Weight: 135 lb 3.2 oz (61.3 kg)    Physical Exam BREAST: Small lipoma on breast.  (exam performed in the presence of a chaperone)  LABORATORY DATA:  I have reviewed the data as listed    Latest Ref Rng & Units 02/04/2023    4:55 AM 02/03/2023    4:26 AM 02/02/2023    5:15 PM  CMP  Glucose 70 - 99 mg/dL 888  818  75   BUN 8 - 23 mg/dL 19  11  12    Creatinine 0.44 - 1.00 mg/dL 9.09   8.90  9.29   Sodium 135 - 145 mmol/L 140  137  143   Potassium 3.5 - 5.1 mmol/L 4.2  3.0  3.6   Chloride 98 - 111 mmol/L 108  100  110   CO2 22 - 32 mmol/L 26  21  20    Calcium  8.9 - 10.3 mg/dL 8.6  8.7  8.5   Total Protein 6.5 - 8.1 g/dL   6.1   Total Bilirubin 0.0 - 1.2 mg/dL   0.7   Alkaline Phos 38 - 126 U/L   74   AST 15 - 41 U/L   20   ALT 0 - 44 U/L   11     Lab Results  Component Value Date   WBC 11.3 (H) 02/03/2023   HGB 12.9 02/03/2023   HCT 39.6 02/03/2023   MCV 82.0 02/03/2023   PLT 237 02/03/2023   NEUTROABS 2.6 02/02/2023    ASSESSMENT & PLAN:  Malignant neoplasm of upper-inner quadrant of left breast in female, estrogen receptor positive (HCC) 05/08/2018 lumpectomy Warden): IDC with DCIS, 1.8cm, grade 2, ER+ (95%), PR-, HER2 negative (1+, IHC), Ki67 20%, clear margins, 4 SLN negative. Oncotype DX score 30: Distant recurrence risk at 9 years 19% Treatment plan: 1.  Adjuvant chemotherapy with Taxotere  and Cytoxan  x4 (patient refused) 2.  Adjuvant radiation therapy at [facility] 3.  Followed by adjuvant antiestrogen therapy: (Patient undecided) ----------------------------------------------------------------------------------------------------------------------------------  CT chest abdomen pelvis 02/11/2019: No evidence of metastatic disease.   December 2022: Stroke Hospitalization 04/25/2021-04/27/2021 left internal carotid artery aneurysm embolization   Left breast pain: 08/19/2023: Biopsy shows fat necrosis, no signs of malignancy.  Pain improving   RTC on an as-needed basis      No orders of the defined types were placed in this encounter.  The patient has a good understanding of the overall plan. she agrees with it. she will call with any problems that may develop before the next visit here.  I personally spent a total of 30 minutes in the care of the patient today including preparing to see the patient, getting/reviewing separately obtained history, performing a  medically appropriate exam/evaluation, counseling and educating, placing orders, referring and communicating with other health care professionals, documenting clinical information in the EHR, independently interpreting results, communicating results, and coordinating care.   Viinay K Tanikka Bresnan, MD 11/03/23

## 2023-11-04 DIAGNOSIS — G479 Sleep disorder, unspecified: Secondary | ICD-10-CM | POA: Diagnosis not present

## 2023-11-04 DIAGNOSIS — J309 Allergic rhinitis, unspecified: Secondary | ICD-10-CM | POA: Diagnosis not present

## 2023-11-04 DIAGNOSIS — E782 Mixed hyperlipidemia: Secondary | ICD-10-CM | POA: Diagnosis not present

## 2023-11-04 DIAGNOSIS — K219 Gastro-esophageal reflux disease without esophagitis: Secondary | ICD-10-CM | POA: Diagnosis not present

## 2023-11-04 DIAGNOSIS — I6930 Unspecified sequelae of cerebral infarction: Secondary | ICD-10-CM | POA: Diagnosis not present

## 2023-11-04 DIAGNOSIS — Z170 Estrogen receptor positive status [ER+]: Secondary | ICD-10-CM | POA: Diagnosis not present

## 2023-11-04 DIAGNOSIS — C50212 Malignant neoplasm of upper-inner quadrant of left female breast: Secondary | ICD-10-CM | POA: Diagnosis not present

## 2023-11-04 DIAGNOSIS — M1612 Unilateral primary osteoarthritis, left hip: Secondary | ICD-10-CM | POA: Diagnosis not present

## 2023-11-04 DIAGNOSIS — E039 Hypothyroidism, unspecified: Secondary | ICD-10-CM | POA: Diagnosis not present

## 2023-11-04 DIAGNOSIS — I10 Essential (primary) hypertension: Secondary | ICD-10-CM | POA: Diagnosis not present

## 2023-11-04 DIAGNOSIS — E559 Vitamin D deficiency, unspecified: Secondary | ICD-10-CM | POA: Diagnosis not present

## 2023-11-05 ENCOUNTER — Encounter (INDEPENDENT_AMBULATORY_CARE_PROVIDER_SITE_OTHER): Payer: Self-pay | Admitting: Gastroenterology

## 2023-11-05 DIAGNOSIS — S82202A Unspecified fracture of shaft of left tibia, initial encounter for closed fracture: Secondary | ICD-10-CM | POA: Diagnosis not present

## 2023-11-05 DIAGNOSIS — D132 Benign neoplasm of duodenum: Secondary | ICD-10-CM | POA: Diagnosis not present

## 2023-11-05 DIAGNOSIS — K30 Functional dyspepsia: Secondary | ICD-10-CM | POA: Diagnosis not present

## 2023-11-24 ENCOUNTER — Encounter: Payer: Self-pay | Admitting: Radiology

## 2023-11-27 ENCOUNTER — Telehealth: Payer: Self-pay | Admitting: *Deleted

## 2023-11-27 ENCOUNTER — Encounter (INDEPENDENT_AMBULATORY_CARE_PROVIDER_SITE_OTHER): Payer: Self-pay | Admitting: Gastroenterology

## 2023-11-27 ENCOUNTER — Ambulatory Visit (INDEPENDENT_AMBULATORY_CARE_PROVIDER_SITE_OTHER): Admitting: Gastroenterology

## 2023-11-27 VITALS — BP 181/95 | HR 62 | Temp 97.8°F | Ht 63.0 in | Wt 136.3 lb

## 2023-11-27 DIAGNOSIS — K219 Gastro-esophageal reflux disease without esophagitis: Secondary | ICD-10-CM

## 2023-11-27 DIAGNOSIS — R109 Unspecified abdominal pain: Secondary | ICD-10-CM

## 2023-11-27 DIAGNOSIS — R110 Nausea: Secondary | ICD-10-CM | POA: Diagnosis not present

## 2023-11-27 DIAGNOSIS — D132 Benign neoplasm of duodenum: Secondary | ICD-10-CM

## 2023-11-27 MED ORDER — OMEPRAZOLE 40 MG PO CPDR: 40.0000 mg | DELAYED_RELEASE_CAPSULE | Freq: Every day | ORAL | 3 refills | Status: DC

## 2023-11-27 MED ORDER — SUCRALFATE 1 GM/10ML PO SUSP: 1.0000 g | Freq: Four times a day (QID) | ORAL | 0 refills | Status: AC

## 2023-11-27 NOTE — Progress Notes (Addendum)
 Referring Provider: [name], MD Primary Care Physician:  [name], MD Primary GI Physician: Dr. Eartha   Chief Complaint  Patient presents with   Abdominal Pain    Patient here today states she has up set stomach all the time. She says she has burning in the epigastric region and lower abdomen. She is currently taking omeprazole  20 mg once per day. Denies diarrhea or constipation, no vomiting, but does have issues with nausea.   HPI:   Kimberly Mccormick is a 73 y.o. female with past medical history of breast cancer s/p , CHF, IBS, hyperlipidemia, stroke, L internal carotid aneurysm s/p embolization, hypertension,   Patient presenting today for:  Abdominal discomfort, nausea and GERD History of duodenal adenoma   Last seen December 2024, at that time having nausea, vomiting, ran out of carafate  which had helped in the past. Having watery stools, multiple times per day, some LUQ pain   Restart carafate , stool testing if diarrhea persists  Last labs in July with TSH 0.237 CMP in january with calcium  8.5, CBC with hgb 12.9  Present: Patient notes nausea, burning in stomach and chest for about 1 month. She notes symptoms in the morning and sometimes at night when she lays down. She is taking omeprazole  20mg  in the morning, sometimes will repeat her omeprazole  dose which will help. She is not currently taking anything else otc or carafate . She notes certain foods like dairy and eggs make her symptoms worse, tomato based foods also are not well tolerated. No constipation or diarrhea. No rectal bleeding or melena. Appetite is not great, she has some early satiety, no weight loss. Denies any new meds or NSAIDs.    Previous imaging studies: -CT of the abdomen pelvis with IV contrast on 08/09/2020 showed right nephrolithiasis which was nonobstructive. -CT of the and pelvis without contrast on 06/27/2021 showed moderate pancreatic atrophy without other abnormalities. -Right  upper quadrant ultrasound on 08/08/2022 which showed status postcholecystectomy and possible fatty liver   Last EGD:07/24/2022 - 3 cm hiatal hernia. - Normal stomach. - A single duodenal polyp. Partially resected via EMR. Resected tissue not retrieved. Remaining tissue was ablated with argon plasma coagulation ( APC) . - Normal second portion of the duodenum. Biopsied.Duodenal mucosa with preserved villoglandular architecture without  increased intraepithelial lymphocytes or evidence of active  inflammation.  No evidence of gluten sensitive enteropathy.  No evidence of dysplasia or malignancy.   Recommend a repeat EGD in 1 year   Last Colonoscopy: 07/2020 - Hemorrhoids found on perianal exam. - Two 4 to 10 mm polyps in the ascending colon, removed with a cold snare. Resected and retrieved. - Non-bleeding internal hemorrhoids.   Advised to repeat in 7 years.  Filed Weights   11/27/23 0847  Weight: 136 lb 4.8 oz (61.8 kg)     Past Medical History:  Diagnosis Date   Aneurysm of left internal carotid artery    Small supraclinoid 5 mm   Breast cancer (HCC)    Left s/p lumpectomy (05/06/2018) and adjuvant chemotherapy (06/23/2018) with Adriamycin and Cytoxan  x4 followed by Taxol weekly x12   DDD (degenerative disc disease), lumbar    Essential hypertension    Genital warts    GERD (gastroesophageal reflux disease)    History of anemia    History of renal insufficiency    Hypertension    Insomnia    Iron deficiency anemia    Ischemic stroke [facility])    December 2022   Mixed  hyperlipidemia    Osteoarthritis    Parkinson's disease (HCC)    Peripheral neuropathy    Related to chemotherapy   Personal history of chemotherapy 2020   Port-A-Cath in place 06/23/2018    Past Surgical History:  Procedure Laterality Date   APPENDECTOMY  1966   BIOPSY  08/29/2020   Procedure: BIOPSY;  Surgeon: Eartha Angelia Sieving, MD;  [facility];  Service: Gastroenterology;;   BIOPSY   07/24/2022   Procedure: BIOPSY;  Surgeon: Eartha Angelia Sieving, MD;  [facility];  Service: Gastroenterology;;   BREAST BIOPSY Left 08/19/2023   US  LT BREAST BX W LOC DEV 1ST LESION IMG BX SPEC US  GUIDE 08/19/2023 [facility]-UL[facility]   BREAST LUMPECTOMY WITH RADIOACTIVE SEED AND SENTINEL LYMPH NODE BIOPSY Left 05/06/2018   Procedure: LEFT BREAST LUMPECTOMY WITH RADIOACTIVE SEED AND LEFT DEEP AXILLARY SENTINEL LYMPH NODE BIOPSY AND BLUE DYE INJECTION;  Surgeon: Gail Favorite, MD;  Location: [facility];  Service: General;  Laterality: Left;   CATARACT EXTRACTION W/PHACO Left 01/10/2014   Procedure: CATARACT EXTRACTION PHACO AND INTRAOCULAR LENS PLACEMENT ; CDE:  4.94;  Surgeon: Dow JULIANNA Burke, MD;  Location: [facility];  Service: Ophthalmology;  Laterality: Left;   CESAREAN SECTION  1986   CHOLECYSTECTOMY     COLONOSCOPY WITH PROPOFOL  N/A 07/25/2020   Procedure: COLONOSCOPY WITH PROPOFOL ;  Surgeon: Eartha Angelia Sieving, MD;  [facility];  Service: Gastroenterology;  Laterality: N/A;  10:55   ESOPHAGOGASTRODUODENOSCOPY (EGD) WITH PROPOFOL  N/A 08/29/2020   Procedure: ESOPHAGOGASTRODUODENOSCOPY (EGD) WITH PROPOFOL ;  Surgeon: Eartha Angelia Sieving, MD;  [facility];  Service: Gastroenterology;  Laterality: N/A;  12:00   ESOPHAGOGASTRODUODENOSCOPY (EGD) WITH PROPOFOL  N/A 07/24/2022   Procedure: ESOPHAGOGASTRODUODENOSCOPY (EGD) WITH PROPOFOL ;  Surgeon: Eartha Angelia Sieving, MD;  [facility];  Service: Gastroenterology;  Laterality: N/A;  12:45;ASA 1-2   HOT HEMOSTASIS  07/24/2022   Procedure: HOT HEMOSTASIS (ARGON PLASMA COAGULATION/BICAP);  Surgeon: Eartha Angelia, Sieving, MD;  [facility];  Service: Gastroenterology;;   IR 3D INDEPENDENT WKST  04/25/2021   IR ANGIO INTRA EXTRACRAN SEL COM CAROTID INNOMINATE UNI R MOD SED  04/25/2021   IR ANGIO INTRA EXTRACRAN SEL INTERNAL CAROTID UNI L MOD SED  04/25/2021   IR ANGIOGRAM FOLLOW UP  STUDY  04/25/2021   IR CT HEAD LTD  04/25/2021   IR NEURO EACH ADD'L AFTER BASIC UNI LEFT (MS)  04/25/2021   IR RADIOLOGIST EVAL & MGMT  03/22/2021   IR RADIOLOGIST EVAL & MGMT  03/28/2021   IR RADIOLOGIST EVAL & MGMT  05/15/2021   IR RADIOLOGIST EVAL & MGMT  09/28/2021   IR TRANSCATH/EMBOLIZ  04/25/2021   IR US  GUIDE VASC ACCESS RIGHT  04/25/2021   MYRINGOTOMY WITH TUBE PLACEMENT Right 02/04/2017   Procedure: REVISION OF RIGHT MYRINGOTOMY WITH TUBE PLACEMENT, WITH EXAM OF LEFT EAR;  Surgeon: Karis Clunes, MD;  Location: [facility];  Service: ENT;  Laterality: Right;   NASAL SINUS SURGERY  2016   polypectomy   OPEN REDUCTION INTERNAL FIXATION (ORIF) TIBIA/FIBULA FRACTURE Left 02/02/2023   Procedure: OPEN REDUCTION INTERNAL FIXATION (ORIF) LEFT TIBIA/FIBULA FRACTURE;  Surgeon: Georgina Ozell LABOR, MD;  Location: [facility];  Service: Orthopedics;  Laterality: Left;   POLYPECTOMY  07/25/2020   Procedure: POLYPECTOMY;  Surgeon: Eartha Angelia Sieving, MD;  [facility];  Service: Gastroenterology;;   POLYPECTOMY  08/29/2020   Procedure: POLYPECTOMY;  Surgeon: Eartha Angelia Sieving, MD;  [facility];  Service: Gastroenterology;;   POLYPECTOMY  07/24/2022   Procedure: POLYPECTOMY;  Surgeon: Eartha Angelia Sieving, MD;  [facility];  Service: Gastroenterology;;   [facility] REMOVAL N/A 01/06/2019   Procedure: REMOVAL PORT-A-CATH;  Surgeon: Gail Favorite, MD;  Location: [facility];  Service: General;  Laterality: N/A;   PORTACATH PLACEMENT Right 06/16/2018   Procedure: INSERTION PORT-A-CATH;  Surgeon: Gail Favorite, MD;  Location: [facility];  Service: General;  Laterality: Right;   RADIOLOGY WITH ANESTHESIA N/A 04/25/2021   Procedure: IR WITH ANESTHESIA EMBOLIZATION;  Surgeon: Dolphus Carrion, MD;  Location: [facility];  Service: Radiology;  Laterality: N/A;   SCLEROTHERAPY  07/24/2022   Procedure: SCLEROTHERAPY;  Surgeon: Eartha Angelia,  Sieving, MD;  [facility];  Service: Gastroenterology;;   SUBMUCOSAL LIFTING INJECTION  08/29/2020   Procedure: SUBMUCOSAL LIFTING INJECTION;  Surgeon: Eartha Angelia Sieving, MD;  [facility];  Service: Gastroenterology;;   ROBLEY LIFTING INJECTION  07/24/2022   Procedure: SUBMUCOSAL LIFTING INJECTION;  Surgeon: Eartha Angelia, Sieving, MD;  [facility];  Service: Gastroenterology;;   TONSILLECTOMY  1970    Current Outpatient Medications  Medication Sig Dispense Refill   acyclovir  (ZOVIRAX ) 400 MG tablet Take 400 mg by mouth 2 (two) times daily.      aspirin  EC 81 MG tablet Take 81 mg by mouth daily. Swallow whole.     cetirizine (ZYRTEC) 10 MG tablet Take 10 mg by mouth daily as needed for allergies.     Cholecalciferol  50 MCG (2000 UT) TABS Take 50 mcg by mouth daily.     metoprolol  succinate (TOPROL -XL) 25 MG 24 hr tablet Take 25 mg by mouth daily.  3   omeprazole  (PRILOSEC) 20 MG capsule Take 1 capsule (20 mg total) by mouth daily. 90 capsule 3   pramipexole  (MIRAPEX ) 0.75 MG tablet Take 1 tablet (0.75 mg total) by mouth 3 (three) times daily. 270 tablet 3   rosuvastatin  (CRESTOR ) 20 MG tablet Take 20 mg by mouth daily.     zolpidem  (AMBIEN ) 10 MG tablet Take 5 mg by mouth at bedtime as needed for sleep.     Cyanocobalamin 5000 MCG TBDP Take 5,000 mcg by mouth daily. (Patient not taking: Reported on 11/27/2023)     ZTLIDO  1.8 % PTCH Apply 1 patch topically every other day.     No current facility-administered medications for this visit.    Allergies as of 11/27/2023 - Review Complete 11/27/2023  Allergen Reaction Noted   Penicillins Hives 01/04/2014   Egg protein-containing drug products Nausea And Vomiting 02/04/2017   Other Nausea And Vomiting 02/04/2017    Social History   Socioeconomic History   Marital status: Legally Separated    Spouse name: Not on file   Number of children: 2   Years of education: Not on file   Highest  education level: Not on file  Occupational History    Comment: retired  Tobacco Use   Smoking status: Never   Smokeless tobacco: Never  Vaping Use   Vaping status: Never Used  Substance and Sexual Activity   Alcohol use: No   Drug use: No   Sexual activity: Not Currently    Birth control/protection: Post-menopausal  Other Topics Concern   Not on file  Social History Narrative   One son lives with her   Social Drivers of Health   Financial Resource Strain: Low Risk  (01/08/2021)   Overall Financial Resource Strain (CARDIA)  Difficulty of Paying Living Expenses: Not hard at all  Food Insecurity: No Food Insecurity (02/02/2023)   Hunger Vital Sign    Worried About Running Out of Food in the Last Year: Never true    Ran Out of Food in the Last Year: Never true  Transportation Needs: No Transportation Needs (02/02/2023)   PRAPARE - Administrator, Civil Service (Medical): No    Lack of Transportation (Non-Medical): No  Physical Activity: Inactive (01/08/2021)   Exercise Vital Sign    Days of Exercise per Week: 0 days    Minutes of Exercise per Session: 0 min  Stress: No Stress Concern Present (01/08/2021)   [employer] of Occupational Health - Occupational Stress Questionnaire    Feeling of Stress : Not at all  Social Connections: Moderately Integrated (02/02/2023)   Social Connection and Isolation Panel    Frequency of Communication with Friends and Family: More than three times a week    Frequency of Social Gatherings with Friends and Family: More than three times a week    Attends Religious Services: More than 4 times per year    Active Member of [employer] or Organizations: Yes    Attends Engineer, Structural: More than 4 times per year    Marital Status: Divorced    Review of systems General: negative for malaise, night sweats, fever, chills, weight loss Neck: Negative for lumps, goiter, pain and significant neck swelling Resp: Negative for  cough, wheezing, dyspnea at rest CV: Negative for chest pain, leg swelling, palpitations, orthopnea GI: denies melena, hematochezia, vomiting, diarrhea, constipation, dysphagia, odyonophagia, or unintentional weight loss. +nausea +early satiety +abdominal burning +heartburn MSK: Negative for joint pain or swelling, back pain, and muscle pain. Derm: Negative for itching or rash Psych: Denies depression, anxiety, memory loss, confusion. No homicidal or suicidal ideation.  Heme: Negative for prolonged bleeding, bruising easily, and swollen nodes. Endocrine: Negative for cold or heat intolerance, polyuria, polydipsia and goiter. Neuro: negative for tremor, gait imbalance, syncope and seizures. The remainder of the review of systems is noncontributory.  Physical Exam: BP (!) 168/106 (BP Location: Left Arm, Patient Position: Sitting, Cuff Size: Normal)   Pulse 60   Temp 97.8 F (36.6 C) (Temporal)   Ht 5' 3 (1.6 m)   Wt 136 lb 4.8 oz (61.8 kg)   BMI 24.14 kg/m  General:   Alert and oriented. No distress noted. Pleasant and cooperative.  Head:  Normocephalic and atraumatic. Eyes:  Conjuctiva clear without scleral icterus. Mouth:  Oral mucosa pink and moist. Good dentition. No lesions. Heart: Normal rate and rhythm, s1 and s2 heart sounds present.  Lungs: Clear lung sounds in all lobes. Respirations equal and unlabored. Abdomen:  +BS, soft, non-tender and non-distended. No rebound or guarding. No HSM or masses noted. Derm: No palmar erythema or jaundice Msk:  Symmetrical without gross deformities. Normal posture. Extremities:  Without edema. Neurologic:  Alert and  oriented x4 Psych:  Alert and cooperative. Normal mood and affect.  Invalid input(s): 6 MONTHS   ASSESSMENT: Kimberly Mccormick is a 73 y.o. female presenting today for nausea, GERD and abdominal discomfort  Patient with history of GERD and nausea, on carafate  in the past with good results. Notes burning in stomach and  heartburn at times, repeating her PPI dose some nights. Some early satiety at times. No weight loss, rectal bleeding, melena, diarrhea or constipation. Suspect this is uncontrolled GERD, will increase omeprazole  to 40mg  daily, restart carafate  1g QID, needs  to implement good reflux precautions. Of note she is also overdue for EGD due to history of duodenal adenoma, we will get her scheduled for this as well. Indications, risks and benefits of procedure discussed in detail with patient. Patient verbalized understanding and is in agreement to proceed with EGD.   PLAN:  -schedule EGD ASA III  -increase omeprazole  to 40mg  daily -good reflux precautions -restart carafate  1g QID  All questions were answered, patient verbalized understanding and is in agreement with plan as outlined above.   Follow Up: 3 months   Keyli Duross L. Mariette, MSN, APRN, AGNP-C Adult-Gerontology Nurse Practitioner [facility]  I have reviewed the note and agree with the APP's assessment as described in this progress note  Toribio Fortune, MD Gastroenterology and Hepatology [facility] Gastroenterology

## 2023-11-27 NOTE — Patient Instructions (Signed)
 Please increase omeprazole  to 40mg  daily Restart carafate  1g 4 times per day Avoid greasy, spicy, fried, citrus foods, and be mindful that caffeine, carbonated drinks, chocolate and alcohol can increase reflux symptoms Stay upright 2-3 hours after eating, prior to lying down and avoid eating late in the evenings. We will schedule repeat EGD as you are overdue for this given previous precancerous polyps in the small bowel  Follow up 3 months

## 2023-11-27 NOTE — Telephone Encounter (Signed)
 Attempted to contact pt, unable to leave message due to mailbox being full  EGD w/Dr.Castaneda, asa 3

## 2023-11-28 NOTE — Telephone Encounter (Signed)
 LMTRC

## 2023-12-01 ENCOUNTER — Telehealth: Payer: Self-pay | Admitting: Adult Health

## 2023-12-01 NOTE — Telephone Encounter (Signed)
 Pt has called and r/s her cx appointment

## 2023-12-03 ENCOUNTER — Encounter: Payer: Self-pay | Admitting: *Deleted

## 2023-12-03 NOTE — Telephone Encounter (Signed)
 Pt has been scheduled for 12/30/23. Instructions mailed.

## 2023-12-08 NOTE — Telephone Encounter (Signed)
 Appointment details confirmed for 12-10-23

## 2023-12-09 NOTE — Progress Notes (Unsigned)
 [facility] [address] [place]. [place] [address] [phone]       OFFICE FOLLOW UP NOTE  Ms. Kimberly Mccormick Date of Birth:  [birth year] Medical Record Number:  [id number]    Primary neurologist: Dr. Buck Reason for visit: Parkinson's disease    SUBJECTIVE:   CHIEF COMPLAINT:  No chief complaint on file.  Follow-up visit:  Prior visit: 04/09/2023  Brief HPI:   Kimberly Mccormick is a 73 y.o. female who is being followed for left-sided predominant Parkinson's disease.  Initially seen by Dr. Buck 11/2019 for several year history of intermittent left hand tremors and exam showed evidence of mild left-sided parkinsonism.  DaTscan  02/2020 supportive of diagnosis. Hx of right corona radiata infarct 12/2020 with incidental finding of cerebral aneurysm on workup s/p endovascular treatment with flow diverter device 04/2021 by Dr. Dolphus.   At prior visit, continued pramiprexol 0.75mg  TID    Interval history:      Returns today for follow-up visit.  Reports overall symptoms stable on pramipexole .  Denies any significant improvement of tremors with higher dose but denies any progression since that time.  Tremor can worsen when she is upset or anxious.  She did suffer a left distal tibia and fibula fracture post fall in 01/2023 (slipped on ice/snow) requiring ORIF, she is currently nonweightbearing, has follow-up with Ortho on 3/31 with plans on obtaining a boot and then restarting therapies.  Denies any issues with constipation or swallowing.     ROS:   14 system review of systems performed and negative with exception of those listed in HPI  PMH:  Past Medical History:  Diagnosis Date   Aneurysm of left internal carotid artery    Small supraclinoid 5 mm   Breast cancer (HCC)    Left s/p lumpectomy (05/06/2018) and adjuvant chemotherapy (06/23/2018) with Adriamycin and Cytoxan  x4 followed by Taxol weekly x12   DDD (degenerative disc disease), lumbar     Essential hypertension    Genital warts    GERD (gastroesophageal reflux disease)    History of anemia    History of renal insufficiency    Hypertension    Insomnia    Iron deficiency anemia    Ischemic stroke [facility])    December 2022   Mixed hyperlipidemia    Osteoarthritis    Parkinson's disease (HCC)    Peripheral neuropathy    Related to chemotherapy   Personal history of chemotherapy 2020   Port-A-Cath in place 06/23/2018    PSH:  Past Surgical History:  Procedure Laterality Date   APPENDECTOMY  1966   BIOPSY  08/29/2020   Procedure: BIOPSY;  Surgeon: Eartha Angelia Sieving, MD;  [facility];  Service: Gastroenterology;;   BIOPSY  07/24/2022   Procedure: BIOPSY;  Surgeon: Eartha Angelia Sieving, MD;  [facility];  Service: Gastroenterology;;   BREAST BIOPSY Left 08/19/2023   US  LT BREAST BX W LOC DEV 1ST LESION IMG BX SPEC US  GUIDE 08/19/2023 [facility]-ULTRASOUND   BREAST LUMPECTOMY WITH RADIOACTIVE SEED AND SENTINEL LYMPH NODE BIOPSY Left 05/06/2018   Procedure: LEFT BREAST LUMPECTOMY WITH RADIOACTIVE SEED AND LEFT DEEP AXILLARY SENTINEL LYMPH NODE BIOPSY AND BLUE DYE INJECTION;  Surgeon: Gail Favorite, MD;  Location: [facility];  Service: General;  Laterality: Left;   CATARACT EXTRACTION W/PHACO Left 01/10/2014   Procedure: CATARACT EXTRACTION PHACO AND INTRAOCULAR LENS PLACEMENT ; CDE:  4.94;  Surgeon: Dow JULIANNA Burke, MD;  Location: [facility];  Service: Ophthalmology;  Laterality: Left;   CESAREAN SECTION  1986   CHOLECYSTECTOMY     COLONOSCOPY WITH PROPOFOL  N/A 07/25/2020   Procedure: COLONOSCOPY WITH PROPOFOL ;  Surgeon: Eartha Angelia Sieving, MD;  [facility];  Service: Gastroenterology;  Laterality: N/A;  10:55   ESOPHAGOGASTRODUODENOSCOPY (EGD) WITH PROPOFOL  N/A 08/29/2020   Procedure: ESOPHAGOGASTRODUODENOSCOPY (EGD) WITH PROPOFOL ;  Surgeon: Eartha Angelia Sieving, MD;  [facility];  Service: Gastroenterology;   Laterality: N/A;  12:00   ESOPHAGOGASTRODUODENOSCOPY (EGD) WITH PROPOFOL  N/A 07/24/2022   Procedure: ESOPHAGOGASTRODUODENOSCOPY (EGD) WITH PROPOFOL ;  Surgeon: Eartha Angelia Sieving, MD;  [facility];  Service: Gastroenterology;  Laterality: N/A;  12:45;ASA 1-2   HOT HEMOSTASIS  07/24/2022   Procedure: HOT HEMOSTASIS (ARGON PLASMA COAGULATION/BICAP);  Surgeon: Eartha Angelia, Sieving, MD;  [facility];  Service: Gastroenterology;;   IR 3D INDEPENDENT WKST  04/25/2021   IR ANGIO INTRA EXTRACRAN SEL COM CAROTID INNOMINATE UNI R MOD SED  04/25/2021   IR ANGIO INTRA EXTRACRAN SEL INTERNAL CAROTID UNI L MOD SED  04/25/2021   IR ANGIOGRAM FOLLOW UP STUDY  04/25/2021   IR CT HEAD LTD  04/25/2021   IR NEURO EACH ADD'L AFTER BASIC UNI LEFT (MS)  04/25/2021   IR RADIOLOGIST EVAL & MGMT  03/22/2021   IR RADIOLOGIST EVAL & MGMT  03/28/2021   IR RADIOLOGIST EVAL & MGMT  05/15/2021   IR RADIOLOGIST EVAL & MGMT  09/28/2021   IR TRANSCATH/EMBOLIZ  04/25/2021   IR US  GUIDE VASC ACCESS RIGHT  04/25/2021   MYRINGOTOMY WITH TUBE PLACEMENT Right 02/04/2017   Procedure: REVISION OF RIGHT MYRINGOTOMY WITH TUBE PLACEMENT, WITH EXAM OF LEFT EAR;  Surgeon: Karis Clunes, MD;  Location: [facility];  Service: ENT;  Laterality: Right;   NASAL SINUS SURGERY  2016   polypectomy   OPEN REDUCTION INTERNAL FIXATION (ORIF) TIBIA/FIBULA FRACTURE Left 02/02/2023   Procedure: OPEN REDUCTION INTERNAL FIXATION (ORIF) LEFT TIBIA/FIBULA FRACTURE;  Surgeon: Georgina Ozell LABOR, MD;  Location: [facility];  Service: Orthopedics;  Laterality: Left;   POLYPECTOMY  07/25/2020   Procedure: POLYPECTOMY;  Surgeon: Eartha Angelia Sieving, MD;  [facility];  Service: Gastroenterology;;   POLYPECTOMY  08/29/2020   Procedure: POLYPECTOMY;  Surgeon: Eartha Angelia Sieving, MD;  [facility];  Service: Gastroenterology;;   POLYPECTOMY  07/24/2022   Procedure: POLYPECTOMY;  Surgeon: Eartha Angelia Sieving, MD;   [facility];  Service: Gastroenterology;;   [facility] REMOVAL N/A 01/06/2019   Procedure: REMOVAL PORT-A-CATH;  Surgeon: Gail Favorite, MD;  Location: [facility];  Service: General;  Laterality: N/A;   PORTACATH PLACEMENT Right 06/16/2018   Procedure: INSERTION PORT-A-CATH;  Surgeon: Gail Favorite, MD;  Location:  SURGERY CENTER;  Service: General;  Laterality: Right;   RADIOLOGY WITH ANESTHESIA N/A 04/25/2021   Procedure: IR WITH ANESTHESIA EMBOLIZATION;  Surgeon: Dolphus Carrion, MD;  Location[facility];  Service: Radiology;  Laterality: N/A;   SCLEROTHERAPY  07/24/2022   Procedure: SCLEROTHERAPY;  Surgeon: Eartha Angelia, Sieving, MD;  [facility];  Service: Gastroenterology;;   SUBMUCOSAL LIFTING INJECTION  08/29/2020   Procedure: SUBMUCOSAL LIFTING INJECTION;  Surgeon: Eartha Angelia Sieving, MD;  [facility];  Service: Gastroenterology;;   ROBLEY LIFTING INJECTION  07/24/2022   Procedure: SUBMUCOSAL LIFTING INJECTION;  Surgeon: Eartha Angelia Sieving, MD;  [facility];  Service: Gastroenterology;;   TONSILLECTOMY  1970    Social History:  Social History   Socioeconomic  History   Marital status: Legally Separated    Spouse name: Not on file   Number of children: 2   Years of education: Not on file   Highest education level: Not on file  Occupational History    Comment: retired  Tobacco Use   Smoking status: Never   Smokeless tobacco: Never  Vaping Use   Vaping status: Never Used  Substance and Sexual Activity   Alcohol use: No   Drug use: No   Sexual activity: Not Currently    Birth control/protection: Post-menopausal  Other Topics Concern   Not on file  Social History Narrative   One son lives with her   Social Drivers of Health   Financial Resource Strain: Low Risk  (01/08/2021)   Overall Financial Resource Strain (CARDIA)    Difficulty of Paying Living Expenses: Not hard at all   Food Insecurity: No Food Insecurity (02/02/2023)   Hunger Vital Sign    Worried About Running Out of Food in the Last Year: Never true    Ran Out of Food in the Last Year: Never true  Transportation Needs: No Transportation Needs (02/02/2023)   PRAPARE - Administrator, Civil Service (Medical): No    Lack of Transportation (Non-Medical): No  Physical Activity: Inactive (01/08/2021)   Exercise Vital Sign    Days of Exercise per Week: 0 days    Minutes of Exercise per Session: 0 min  Stress: No Stress Concern Present (01/08/2021)   [employer] of Occupational Health - Occupational Stress Questionnaire    Feeling of Stress : Not at all  Social Connections: Moderately Integrated (02/02/2023)   Social Connection and Isolation Panel    Frequency of Communication with Friends and Family: More than three times a week    Frequency of Social Gatherings with Friends and Family: More than three times a week    Attends Religious Services: More than 4 times per year    Active Member of [employer] or Organizations: Yes    Attends Engineer, Structural: More than 4 times per year    Marital Status: Divorced  Intimate Partner Violence: Not At Risk (02/02/2023)   Humiliation, Afraid, Rape, and Kick questionnaire    Fear of Current or Ex-Partner: No    Emotionally Abused: No    Physically Abused: No    Sexually Abused: No    Family History:  Family History  Problem Relation Age of Onset   Lupus Mother    Heart attack Father        age 60   Prostate cancer Father    Hypothyroidism Sister    Breast cancer Sister    Hypertension Sister    Breast cancer Sister    Hypertension Brother    Parkinson's disease Paternal Aunt    Stroke Neg Hx     Medications:   Current Outpatient Medications on File Prior to Visit  Medication Sig Dispense Refill   acyclovir  (ZOVIRAX ) 400 MG tablet Take 400 mg by mouth 2 (two) times daily.      aspirin  EC 81 MG tablet Take 81 mg by mouth daily.  Swallow whole.     cetirizine (ZYRTEC) 10 MG tablet Take 10 mg by mouth daily as needed for allergies.     Cholecalciferol  50 MCG (2000 UT) TABS Take 50 mcg by mouth daily.     Cyanocobalamin 5000 MCG TBDP Take 5,000 mcg by mouth daily. (Patient not taking: Reported on 11/27/2023)     metoprolol  succinate (  TOPROL -XL) 25 MG 24 hr tablet Take 25 mg by mouth daily.  3   omeprazole  (PRILOSEC) 40 MG capsule Take 1 capsule (40 mg total) by mouth daily. 90 capsule 3   pramipexole  (MIRAPEX ) 0.75 MG tablet Take 1 tablet (0.75 mg total) by mouth 3 (three) times daily. 270 tablet 3   rosuvastatin  (CRESTOR ) 20 MG tablet Take 20 mg by mouth daily.     sucralfate  (CARAFATE ) 1 GM/10ML suspension Take 10 mLs (1 g total) by mouth 4 (four) times daily. 1200 mL 0   zolpidem  (AMBIEN ) 10 MG tablet Take 5 mg by mouth at bedtime as needed for sleep.     ZTLIDO  1.8 % PTCH Apply 1 patch topically every other day.     No current facility-administered medications on file prior to visit.    Allergies:   Allergies  Allergen Reactions   Penicillins Hives    Has patient had a PCN reaction causing immediate rash, facial/tongue/throat swelling, SOB or lightheadedness with hypotension: No  Has patient had a PCN reaction causing severe rash involving mucus membranes or skin necrosis: No  Has patient had a PCN reaction that required hospitalization: No  Has patient had a PCN reaction occurring within the last 10 years: No  If all of the above answers are NO, then may proceed with Cephalosporin use.  Has patient had a PCN reaction causing immediate rash, facial/tongue/throat swelling, SOB or lightheadedness with hypotension: No Has patient had a PCN reaction causing severe rash involving mucus membranes or skin necrosis: No Has patient had a PCN reaction that required hospitalization: No Has patient had a PCN reaction occurring within the last 10 years: No If all of the above answers are NO, then may proceed with  Cephalosporin use.   Egg Protein-Containing Drug Products Nausea And Vomiting   Other Nausea And Vomiting    Dairy products\       OBJECTIVE:  Physical Exam  There were no vitals filed for this visit.   There is no height or weight on file to calculate BMI. No results found.   General: well developed, well nourished, very pleasant elderly female, seated, in no evident distress  Neurologic Exam Mental Status: Awake and fully alert. Oriented to place and time. Recent and remote memory intact. Attention span, concentration and fund of knowledge appropriate. Mood and affect appropriate.  Mild hypophonia, no dysarthria or voice tremor noted.  Mild to moderate facial masking Cranial Nerves: Pupils equal, briskly reactive to light. Extraocular movements full without nystagmus. Visual fields full to confrontation. Hearing mildly impaired. Facial sensation intact. Face, tongue, palate moves normally and symmetrically.  Motor: Normal strength in all tested extremity muscles except limited testing LLE d/t recent fracture.  Mild resting tremor of LUE and mildly increased tone of left wrist.  Minimal postural tremor of LUE.  Unable to appreciate action or intention tremor bilaterally.  No lower extremity tremor.  Mild to moderate difficulty with finger taps on left side, fairly normal on right side.  Mildly impaired foot tap on left, normal on right. Sensory.: intact to touch , pinprick , position and vibratory sensation.  Gait and Station: deferred, currently nonweightbearing 2/2 recent LLE fracture Reflexes: 1+ and symmetric. Toes downgoing.         ASSESSMENT/PLAN: Kimberly Mccormick is a 72 y.o. year old female with left-sided predominant Parkinson's disease since 2021. Hx of right corona radiata infarct 12/2020 with incidental finding of cerebral aneurysm s/p flow diverter device 04/2021 with Dr. Dolphus, routinely  followed by Dr. Dolphus.    1.  Parkinson disease  -overall stable  since prior visit -Continue pramipexole  0.75mg  BID -Next step would be initiating carbidopa levodopa therapy but she declines interest at this time -Continue routine exercise and ensure adequate hydration  2. LLE fracture  -Tibia and fibula fracture s/p ORIF 01/2023, remains nonweightbearing, is scheduled for Ortho follow-up 3/31 with hopes of being put in a walking boot and restart therapies     Follow up in 6 months or call earlier if needed     CC:  PCP: Burdine, Elspeth BRAVO, MD    I spent 20 minutes of face-to-face and non-face-to-face time with patient and son.  This included previsit chart review, lab review, study review, order entry, electronic health record documentation, patient education regarding above diagnoses and treatment plan and answered all other questions to patient's satisfaction  Harlene Bogaert, [facility]  [facility] [address] [place], [place] [address]  Phone [phone] Fax [phone] Note: This document was prepared with digital dictation and possible smart phrase technology. Any transcriptional errors that result from this process are unintentional.

## 2023-12-10 ENCOUNTER — Telehealth: Payer: Self-pay | Admitting: Adult Health

## 2023-12-10 ENCOUNTER — Encounter: Payer: Self-pay | Admitting: Adult Health

## 2023-12-10 ENCOUNTER — Ambulatory Visit (INDEPENDENT_AMBULATORY_CARE_PROVIDER_SITE_OTHER): Admitting: Adult Health

## 2023-12-10 VITALS — BP 142/80 | HR 78 | Ht 64.0 in | Wt 135.0 lb

## 2023-12-10 DIAGNOSIS — I671 Cerebral aneurysm, nonruptured: Secondary | ICD-10-CM

## 2023-12-10 DIAGNOSIS — G20A1 Parkinson's disease without dyskinesia, without mention of fluctuations: Secondary | ICD-10-CM | POA: Diagnosis not present

## 2023-12-10 MED ORDER — PRAMIPEXOLE DIHYDROCHLORIDE 0.75 MG PO TABS: 0.7500 mg | ORAL_TABLET | Freq: Three times a day (TID) | ORAL | 3 refills | Status: AC

## 2023-12-10 MED ORDER — PRAMIPEXOLE DIHYDROCHLORIDE 0.125 MG PO TABS: 0.1250 mg | ORAL_TABLET | Freq: Three times a day (TID) | ORAL | 11 refills | Status: AC

## 2023-12-10 NOTE — Telephone Encounter (Signed)
 no auth required sent to [facility] [phone]

## 2023-12-10 NOTE — Patient Instructions (Addendum)
 Your Plan:  Increase pramipexole  0.875 mg 3 times daily (0.125mg  + 0 4.75mg  tablet) - if symptoms persist over the next 2 to 3 weeks, please call for further medication recommendations  Referrals will be placed to [facility] physical, occupational and speech therapy  You will be called to schedule a CTA to monitor your cerebral aneurysm     Follow-up in 6 months or call earlier if needed     Thank you for coming to see us  at [facility]. I hope we have been able to provide you high quality care today.  You may receive a patient satisfaction survey over the next few weeks. We would appreciate your feedback and comments so that we may continue to improve ourselves and the health of our patients.

## 2023-12-26 ENCOUNTER — Encounter (HOSPITAL_COMMUNITY)
Admission: RE | Admit: 2023-12-26 | Discharge: 2023-12-26 | Disposition: A | Source: Ambulatory Visit | Attending: Gastroenterology

## 2023-12-26 NOTE — Pre-Procedure Instructions (Signed)
 Attempted pre-op phone call. MB is full and could not leave a message.

## 2023-12-26 NOTE — Pre-Procedure Instructions (Signed)
 Attempted pre-op phone call to home number and left VM for her to call us  back.

## 2023-12-29 ENCOUNTER — Ambulatory Visit (HOSPITAL_COMMUNITY): Admitting: Speech Pathology

## 2023-12-29 ENCOUNTER — Encounter (HOSPITAL_COMMUNITY): Payer: Self-pay

## 2023-12-29 ENCOUNTER — Other Ambulatory Visit: Payer: Self-pay

## 2023-12-30 ENCOUNTER — Ambulatory Visit (HOSPITAL_COMMUNITY): Admitting: Anesthesiology

## 2023-12-30 ENCOUNTER — Ambulatory Visit (HOSPITAL_COMMUNITY)
Admission: RE | Admit: 2023-12-30 | Discharge: 2023-12-30 | Disposition: A | Attending: Gastroenterology | Admitting: Gastroenterology

## 2023-12-30 ENCOUNTER — Encounter (HOSPITAL_COMMUNITY): Admitting: Anesthesiology

## 2023-12-30 ENCOUNTER — Other Ambulatory Visit: Payer: Self-pay

## 2023-12-30 ENCOUNTER — Encounter (HOSPITAL_COMMUNITY): Admission: RE | Disposition: A | Payer: Self-pay | Source: Home / Self Care | Attending: Gastroenterology

## 2023-12-30 ENCOUNTER — Encounter (HOSPITAL_COMMUNITY): Payer: Self-pay | Admitting: Gastroenterology

## 2023-12-30 DIAGNOSIS — K222 Esophageal obstruction: Secondary | ICD-10-CM | POA: Diagnosis not present

## 2023-12-30 HISTORY — PX: ESOPHAGOGASTRODUODENOSCOPY: SHX5428

## 2023-12-30 SURGERY — EGD (ESOPHAGOGASTRODUODENOSCOPY)
Anesthesia: Monitor Anesthesia Care

## 2023-12-30 MED ORDER — OMEPRAZOLE 40 MG PO CPDR: 40.0000 mg | DELAYED_RELEASE_CAPSULE | Freq: Two times a day (BID) | ORAL | 1 refills | Status: AC

## 2023-12-30 MED ORDER — LIDOCAINE 2% (20 MG/ML) 5 ML SYRINGE
INTRAMUSCULAR | Status: DC | PRN
  Administered 2023-12-30: 100 mg via INTRAVENOUS

## 2023-12-30 MED ORDER — LACTATED RINGERS IV SOLN: INTRAVENOUS | Status: DC

## 2023-12-30 MED ORDER — STERILE WATER FOR IRRIGATION IR SOLN
Status: DC | PRN
  Administered 2023-12-30: 50 mL

## 2023-12-30 MED ORDER — PROPOFOL 500 MG/50ML IV EMUL
INTRAVENOUS | Status: DC | PRN
  Administered 2023-12-30: 30 mg via INTRAVENOUS
  Administered 2023-12-30: 20 mg via INTRAVENOUS
  Administered 2023-12-30: 70 mg via INTRAVENOUS
  Administered 2023-12-30: 125 ug/kg/min via INTRAVENOUS

## 2023-12-30 NOTE — Anesthesia Procedure Notes (Signed)
 Date/Time: 12/30/2023 8:35 AM  Performed by: Para Jerelene CROME, CRNAOxygen Delivery Method: Simple face mask Comments: POM Face Mask.

## 2023-12-30 NOTE — Discharge Instructions (Signed)
 You are being discharged to home.  Resume your previous diet.  Take Prilosec (omeprazole ) 40 mg by mouth twice a day for six months.  Return to your GI office in three months.  We are waiting for your pathology results.

## 2023-12-30 NOTE — Op Note (Signed)
 [facility] Patient Name: Kimberly Mccormick Procedure Date: 12/30/2023 7:57 AM MRN: [id number] Date of Birth: 1950-10-09 Attending MD: Toribio Fortune , , [id number] CSN: [id number] Age: 73 Admit Type: Outpatient Procedure:                Upper GI endoscopy Indications:              Follow-up of tumor of the GI tract Providers:                Toribio Fortune, Jon LABOR. Gerome RN, RN, Daphne Mulch Technician, Pensions Consultant Referring MD:              Medicines:                Monitored Anesthesia Care Complications:            No immediate complications. Estimated Blood Loss:     Estimated blood loss: none. Procedure:                Pre-Anesthesia Assessment:                           - Prior to the procedure, a History and Physical                            was performed, and patient medications, allergies                            and sensitivities were reviewed. The patient's                            tolerance of previous anesthesia was reviewed.                           - The risks and benefits of the procedure and the                            sedation options and risks were discussed with the                            patient. All questions were answered and informed                            consent was obtained.                           After obtaining informed consent, the endoscope was                            passed under direct vision. Throughout the                            procedure, the patient's blood pressure, pulse, and  oxygen  saturations were monitored continuously. The                            [id number] ([id number]) Upper was introduced through                            the mouth, and advanced to the second part of                            duodenum. The upper GI endoscopy was accomplished                            without difficulty. The patient tolerated the                            procedure  well. Scope In: 8:38:51 AM Scope Out: 8:47:46 AM Total Procedure Duration: 0 hours 8 minutes 55 seconds  Findings:      One benign-appearing, intrinsic moderate (circumferential scarring or       stenosis; an endoscope may pass) stenosis was found in the upper third       of the esophagus. This stenosis measured 1 cm (inner diameter) x less       than one cm (in length). The stenosis was traversed after performing       pressure of the strictured area with the scope. The dilation site was       examined and showed moderate mucosal disruption.      A 2 cm hiatal hernia was present.      Multiple small sessile fundic gland polyps with no bleeding were found       in the gastric body.      A single 5 mm scar with no bleeding was found in the duodenal bulb.       Imaging was performed using white light and narrow band imaging to       visualize the mucosa. No adenomatous changes were observed. Biopsies       were taken with a cold forceps for histology. Impression:               - Benign-appearing esophageal stenosis.                           - 2 cm hiatal hernia.                           - Multiple fundic gland polyps.                           - A single scar with no bleeding in the duodenum.                            Biopsied. Moderate Sedation:      Per Anesthesia Care Recommendation:           - Discharge patient to home (ambulatory).                           - Resume previous diet.                           -  Use Prilosec (omeprazole ) 40 mg PO BID for 6                            months.                           - Return to GI office in 3 months. Will assess                            dysphagia and need to repeat earlier endoscopy.                           - Await pathology results. Procedure Code(s):        --- Professional ---                           [phone], Esophagogastroduodenoscopy, flexible,                            transoral; with biopsy, single or  multiple Diagnosis Code(s):        --- Professional ---                           K22.2, Esophageal obstruction                           K44.9, Diaphragmatic hernia without obstruction or                            gangrene                           K31.7, Polyp of stomach and duodenum                           K31.89, Other diseases of stomach and duodenum                           D49.0, Neoplasm of unspecified behavior of                            digestive system CPT copyright 2022 American Medical Association. All rights reserved. The codes documented in this report are preliminary and upon coder review may  be revised to meet current compliance requirements. Toribio Fortune, MD Toribio Fortune,  12/30/2023 9:00:50 AM This report has been signed electronically. Number of Addenda: 0

## 2023-12-30 NOTE — Anesthesia Preprocedure Evaluation (Signed)
 Anesthesia Evaluation  Patient identified by MRN, date of birth, ID band Patient awake    Reviewed: Allergy & Precautions, H&P , NPO status , Patient's Chart, lab work & pertinent test results, reviewed documented beta blocker date and time   Airway Mallampati: II  TM Distance: >3 FB Neck ROM: full    Dental no notable dental hx.    Pulmonary neg pulmonary ROS   Pulmonary exam normal breath sounds clear to auscultation       Cardiovascular Exercise Tolerance: Good hypertension, + DOE   Rhythm:regular Rate:Normal     Neuro/Psych  Neuromuscular disease CVA  negative psych ROS   GI/Hepatic Neg liver ROS,GERD  ,,  Endo/Other  negative endocrine ROS    Renal/GU CRFnegative Renal ROS  negative genitourinary   Musculoskeletal   Abdominal   Peds  Hematology  (+) Blood dyscrasia, anemia   Anesthesia Other Findings   Reproductive/Obstetrics negative OB ROS                              Anesthesia Physical Anesthesia Plan  ASA: 3  Anesthesia Plan: MAC   Post-op Pain Management:    Induction:   PONV Risk Score and Plan: Propofol  infusion  Airway Management Planned:   Additional Equipment:   Intra-op Plan:   Post-operative Plan:   Informed Consent: I have reviewed the patients History and Physical, chart, labs and discussed the procedure including the risks, benefits and alternatives for the proposed anesthesia with the patient or authorized representative who has indicated his/her understanding and acceptance.     Dental Advisory Given  Plan Discussed with: CRNA  Anesthesia Plan Comments:         Anesthesia Quick Evaluation

## 2023-12-30 NOTE — Transfer of Care (Signed)
 Immediate Anesthesia Transfer of Care Note  Patient: Kimberly Mccormick  Procedure(s) Performed: EGD (ESOPHAGOGASTRODUODENOSCOPY)  Patient Location: Short Stay  Anesthesia Type:MAC  Level of Consciousness: drowsy and patient cooperative  Airway & Oxygen  Therapy: Patient Spontanous Breathing and Patient connected to face mask oxygen   Post-op Assessment: Report given to RN and Post -op Vital signs reviewed and stable  Post vital signs: Reviewed and stable  Last Vitals:  Vitals Value Taken Time  BP 112/67 12/30/23 08:53  Temp 36.4 C 12/30/23 08:53  Pulse 66 12/30/23 08:53  Resp 18 12/30/23 08:53  SpO2 100 % 12/30/23 08:53    Last Pain:  Vitals:   12/30/23 0853  TempSrc: Axillary  PainSc: Asleep         Complications: No notable events documented.

## 2023-12-30 NOTE — H&P (Signed)
 Kimberly Mccormick is an 73 y.o. female.   Chief Complaint: history of duodenal adenoma HPI: 73 y.o. female with past medical history of breast cancer s/p , CHF, IBS, hyperlipidemia, stroke, L internal carotid aneurysm s/p embolization, hypertension, coming for surveillance of duodenal adenoma.  The patient denies having any  vomiting, fever, chills, hematochezia, melena, hematemesis, abdominal distention, diarrhea, jaundice, pruritus or weight loss. Has previously endorsed some abdominal pain and nausea.   Past Medical History:  Diagnosis Date   Aneurysm of left internal carotid artery    Small supraclinoid 5 mm   Breast cancer (HCC)    Left s/p lumpectomy (05/06/2018) and adjuvant chemotherapy (06/23/2018) with Adriamycin and Cytoxan  x4 followed by Taxol weekly x12   DDD (degenerative disc disease), lumbar    Essential hypertension    Genital warts    GERD (gastroesophageal reflux disease)    History of anemia    History of renal insufficiency    Hypertension    Insomnia    Iron deficiency anemia    Ischemic stroke [facility])    December 2022   Mixed hyperlipidemia    Osteoarthritis    Parkinson's disease (HCC)    Peripheral neuropathy    Related to chemotherapy   Personal history of chemotherapy 2020   Port-A-Cath in place 06/23/2018    Past Surgical History:  Procedure Laterality Date   APPENDECTOMY  1966   BIOPSY  08/29/2020   Procedure: BIOPSY;  Surgeon: Eartha Angelia Sieving, MD;  [facility];  Service: Gastroenterology;;   BIOPSY  07/24/2022   Procedure: BIOPSY;  Surgeon: Eartha Angelia Sieving, MD;  [facility];  Service: Gastroenterology;;   BREAST BIOPSY Left 08/19/2023   US  LT BREAST BX W LOC DEV 1ST LESION IMG BX SPEC US  GUIDE 08/19/2023 [facility]UL[facility]   BREAST LUMPECTOMY WITH RADIOACTIVE SEED AND SENTINEL LYMPH NODE BIOPSY Left 05/06/2018   Procedure: LEFT BREAST LUMPECTOMY WITH RADIOACTIVE SEED AND LEFT DEEP AXILLARY SENTINEL LYMPH NODE BIOPSY AND  BLUE DYE INJECTION;  Surgeon: Gail Favorite, MD;  Location: [facility];  Service: General;  Laterality: Left;   CATARACT EXTRACTION W/PHACO Left 01/10/2014   Procedure: CATARACT EXTRACTION PHACO AND INTRAOCULAR LENS PLACEMENT ; CDE:  4.94;  Surgeon: Dow JULIANNA Burke, MD;  [facility]: [facility];  Service: Ophthalmology;  Laterality: Left;   CESAREAN SECTION  1986   CHOLECYSTECTOMY     COLONOSCOPY WITH PROPOFOL  N/A 07/25/2020   Procedure: COLONOSCOPY WITH PROPOFOL ;  Surgeon: Eartha Angelia Sieving, MD;  [facility];  Service: Gastroenterology;  Laterality: N/A;  10:55   ESOPHAGOGASTRODUODENOSCOPY (EGD) WITH PROPOFOL  N/A 08/29/2020   Procedure: ESOPHAGOGASTRODUODENOSCOPY (EGD) WITH PROPOFOL ;  Surgeon: Eartha Angelia Sieving, MD;  [facility];  Service: Gastroenterology;  Laterality: N/A;  12:00   ESOPHAGOGASTRODUODENOSCOPY (EGD) WITH PROPOFOL  N/A 07/24/2022   Procedure: ESOPHAGOGASTRODUODENOSCOPY (EGD) WITH PROPOFOL ;  Surgeon: Eartha Angelia Sieving, MD;  [facility];  Service: Gastroenterology;  Laterality: N/A;  12:45;ASA 1-2   HOT HEMOSTASIS  07/24/2022   Procedure: HOT HEMOSTASIS (ARGON PLASMA COAGULATION/BICAP);  Surgeon: Eartha Angelia, Sieving, MD;  [facility];  Service: Gastroenterology;;   IR 3D INDEPENDENT WKST  04/25/2021   IR ANGIO INTRA EXTRACRAN SEL COM CAROTID INNOMINATE UNI R MOD SED  04/25/2021   IR ANGIO INTRA EXTRACRAN SEL INTERNAL CAROTID UNI L MOD SED  04/25/2021   IR ANGIOGRAM FOLLOW UP STUDY  04/25/2021   IR CT HEAD LTD  04/25/2021   IR NEURO EACH  ADD'L AFTER BASIC UNI LEFT (MS)  04/25/2021   IR RADIOLOGIST EVAL & MGMT  03/22/2021   IR RADIOLOGIST EVAL & MGMT  03/28/2021   IR RADIOLOGIST EVAL & MGMT  05/15/2021   IR RADIOLOGIST EVAL & MGMT  09/28/2021   IR TRANSCATH/EMBOLIZ  04/25/2021   IR US  GUIDE VASC ACCESS RIGHT  04/25/2021   MYRINGOTOMY WITH TUBE PLACEMENT Right 02/04/2017   Procedure: REVISION OF RIGHT MYRINGOTOMY WITH TUBE  PLACEMENT, WITH EXAM OF LEFT EAR;  Surgeon: Karis Clunes, MD;  Location: [facility];  Service: ENT;  Laterality: Right;   NASAL SINUS SURGERY  2016   polypectomy   OPEN REDUCTION INTERNAL FIXATION (ORIF) TIBIA/FIBULA FRACTURE Left 02/02/2023   Procedure: OPEN REDUCTION INTERNAL FIXATION (ORIF) LEFT TIBIA/FIBULA FRACTURE;  Surgeon: Georgina Ozell LABOR, MD;  [facility];  Service: Orthopedics;  Laterality: Left;   POLYPECTOMY  07/25/2020   Procedure: POLYPECTOMY;  Surgeon: Eartha Angelia Sieving, MD;  [facility];  Service: Gastroenterology;;   POLYPECTOMY  08/29/2020   Procedure: POLYPECTOMY;  Surgeon: Eartha Angelia Sieving, MD;  [facility];  Service: Gastroenterology;;   POLYPECTOMY  07/24/2022   Procedure: POLYPECTOMY;  Surgeon: Eartha Angelia Sieving, MD;  [facility];  Service: Gastroenterology;;   [facility] REMOVAL N/A 01/06/2019   Procedure: REMOVAL PORT-A-CATH;  Surgeon: Gail Favorite, MD;  Location: [facility];  Service: General;  Laterality: N/A;   PORTACATH PLACEMENT Right 06/16/2018   Procedure: INSERTION PORT-A-CATH;  Surgeon: Gail Favorite, MD;  Location: [facility];  Service: General;  Laterality: Right;   RADIOLOGY WITH ANESTHESIA N/A 04/25/2021   Procedure: IR WITH ANESTHESIA EMBOLIZATION;  Surgeon: Dolphus Carrion, MD;  [facility];  Service: Radiology;  Laterality: N/A;   SCLEROTHERAPY  07/24/2022   Procedure: SCLEROTHERAPY;  Surgeon: Eartha Angelia, Sieving, MD;  [facility];  Service: Gastroenterology;;   SUBMUCOSAL LIFTING INJECTION  08/29/2020   Procedure: SUBMUCOSAL LIFTING INJECTION;  Surgeon: Eartha Angelia, Sieving, MD;  [facility];  Service: Gastroenterology;;   SUBMUCOSAL LIFTING INJECTION  07/24/2022   Procedure: SUBMUCOSAL LIFTING INJECTION;  Surgeon: Eartha Angelia Sieving, MD;  [facility];  Service: Gastroenterology;;   TONSILLECTOMY   1970    Family History  Problem Relation Age of Onset   Lupus Mother    Heart attack Father        age 45   Prostate cancer Father    Hypothyroidism Sister    Breast cancer Sister    Hypertension Sister    Breast cancer Sister    Hypertension Brother    Parkinson's disease Paternal Aunt    Stroke Neg Hx    Social History:  reports that she has never smoked. She has never used smokeless tobacco. She reports that she does not drink alcohol and does not use drugs.  Allergies:  Allergies  Allergen Reactions   Penicillins Hives    Has patient had a PCN reaction causing immediate rash, facial/tongue/throat swelling, SOB or lightheadedness with hypotension: No  Has patient had a PCN reaction causing severe rash involving mucus membranes or skin necrosis: No  Has patient had a PCN reaction that required hospitalization: No  Has patient had a PCN reaction occurring within the last 10 years: No  If all of the above answers are NO, then may proceed with Cephalosporin use.  Has patient had a PCN reaction causing immediate rash, facial/tongue/throat swelling, SOB or lightheadedness with hypotension: No Has patient  had a PCN reaction causing severe rash involving mucus membranes or skin necrosis: No Has patient had a PCN reaction that required hospitalization: No Has patient had a PCN reaction occurring within the last 10 years: No If all of the above answers are NO, then may proceed with Cephalosporin use.   Egg Protein-Containing Drug Products Nausea And Vomiting   Other Nausea And Vomiting    Dairy products\     Medications Prior to Admission  Medication Sig Dispense Refill   acyclovir  (ZOVIRAX ) 400 MG tablet Take 400 mg by mouth 2 (two) times daily.      aspirin  EC 81 MG tablet Take 81 mg by mouth daily. Swallow whole.     cetirizine (ZYRTEC) 10 MG tablet Take 10 mg by mouth daily as needed for allergies.     Cholecalciferol  50 MCG (2000 UT) TABS Take 50 mcg by mouth daily.      Cyanocobalamin  5000 MCG TBDP Take 5,000 mcg by mouth daily.     metoprolol  succinate (TOPROL -XL) 25 MG 24 hr tablet Take 25 mg by mouth daily.  3   omeprazole  (PRILOSEC) 40 MG capsule Take 1 capsule (40 mg total) by mouth daily. 90 capsule 3   pramipexole  (MIRAPEX ) 0.125 MG tablet Take 1 tablet (0.125 mg total) by mouth 3 (three) times daily. In addition to 0.75mg  tab TID 90 tablet 11   pramipexole  (MIRAPEX ) 0.75 MG tablet Take 1 tablet (0.75 mg total) by mouth 3 (three) times daily. 270 tablet 3   rosuvastatin  (CRESTOR ) 20 MG tablet Take 20 mg by mouth daily.     zolpidem  (AMBIEN ) 10 MG tablet Take 5 mg by mouth at bedtime as needed for sleep.     ZTLIDO  1.8 % PTCH Apply 1 patch topically every other day.     sucralfate  (CARAFATE ) 1 GM/10ML suspension Take 10 mLs (1 g total) by mouth 4 (four) times daily. 1200 mL 0    No results found for this or any previous visit (from the past 48 hours). No results found.  Review of Systems  Gastrointestinal:  Positive for abdominal pain and nausea.  All other systems reviewed and are negative.   Blood pressure (!) 152/79, pulse 63, temperature 98.2 F (36.8 C), temperature source Oral, resp. rate (!) 24, height 5' 4 (1.626 m), weight 61.2 kg, SpO2 100%. Physical Exam  GENERAL: The patient is AO x3, in no acute distress. HEENT: Head is normocephalic and atraumatic. EOMI are intact. Mouth is well hydrated and without lesions. NECK: Supple. No masses LUNGS: Clear to auscultation. No presence of rhonchi/wheezing/rales. Adequate chest expansion HEART: RRR, normal s1 and s2. ABDOMEN: Soft, nontender, no guarding, no peritoneal signs, and nondistended. BS +. No masses. EXTREMITIES: Without any cyanosis, clubbing, rash, lesions or edema. NEUROLOGIC: AOx3, no focal motor deficit. SKIN: no jaundice, no rashes  Assessment/Plan 73 y.o. female with past medical history of breast cancer s/p , CHF, IBS, hyperlipidemia, stroke, L internal carotid aneurysm  s/p embolization, hypertension, coming for surveillance of duodenal adenoma. Will proceed with EGD.  Toribio Eartha Flavors, MD 12/30/2023, 8:04 AM

## 2023-12-30 NOTE — Anesthesia Postprocedure Evaluation (Signed)
 Anesthesia Post Note  Patient: SHARLON PFOHL  Procedure(s) Performed: EGD (ESOPHAGOGASTRODUODENOSCOPY)  Patient location during evaluation: Phase II Anesthesia Type: MAC Level of consciousness: awake Pain management: pain level controlled Vital Signs Assessment: post-procedure vital signs reviewed and stable Respiratory status: spontaneous breathing and respiratory function stable Cardiovascular status: blood pressure returned to baseline and stable Postop Assessment: no headache and no apparent nausea or vomiting Anesthetic complications: no Comments: Late entry   No notable events documented.   Last Vitals:  Vitals:   12/30/23 0853 12/30/23 0901  BP: 112/67 (!) 144/76  Pulse: 66   Resp: 18   Temp: (!) 36.4 C   SpO2: 100% 100%    Last Pain:  Vitals:   12/30/23 0901  TempSrc:   PainSc: 0-No pain                 Yvonna JINNY Bosworth

## 2023-12-31 ENCOUNTER — Ambulatory Visit (HOSPITAL_COMMUNITY): Attending: Adult Health | Admitting: Occupational Therapy

## 2023-12-31 ENCOUNTER — Ambulatory Visit (INDEPENDENT_AMBULATORY_CARE_PROVIDER_SITE_OTHER): Payer: Self-pay | Admitting: Gastroenterology

## 2023-12-31 DIAGNOSIS — R29818 Other symptoms and signs involving the nervous system: Secondary | ICD-10-CM

## 2023-12-31 DIAGNOSIS — R278 Other lack of coordination: Secondary | ICD-10-CM | POA: Diagnosis present

## 2023-12-31 DIAGNOSIS — G20A1 Parkinson's disease without dyskinesia, without mention of fluctuations: Secondary | ICD-10-CM | POA: Insufficient documentation

## 2023-12-31 DIAGNOSIS — R471 Dysarthria and anarthria: Secondary | ICD-10-CM | POA: Insufficient documentation

## 2023-12-31 LAB — SURGICAL PATHOLOGY

## 2023-12-31 NOTE — Therapy (Signed)
 OUTPATIENT OCCUPATIONAL THERAPY NEURO EVALUATION  Patient Name: Kimberly Mccormick MRN: [id number] DOB:[birth year], 73 y.o., female Today's Date: 12/31/2023  PCP: Lari Standing, MD REFERRING PROVIDER: Whitfield Raisin, NP  END OF SESSION:  OT End of Session - 12/31/23 1626     Visit Number 1    Number of Visits 6    Date for Recertification  02/27/24    Authorization Type [employer] Dual Complete    OT Start Time 1535    OT Stop Time 1604    OT Time Calculation (min) 29 min    Activity Tolerance Patient tolerated treatment well    Behavior During Therapy WFL for tasks assessed/performed          Past Medical History:  Diagnosis Date   Aneurysm of left internal carotid artery    Small supraclinoid 5 mm   Breast cancer (HCC)    Left s/p lumpectomy (05/06/2018) and adjuvant chemotherapy (06/23/2018) with Adriamycin and Cytoxan  x4 followed by Taxol weekly x12   DDD (degenerative disc disease), lumbar    Essential hypertension    Genital warts    GERD (gastroesophageal reflux disease)    History of anemia    History of renal insufficiency    Hypertension    Insomnia    Iron deficiency anemia    Ischemic stroke [facility])    December 2022   Mixed hyperlipidemia    Osteoarthritis    Parkinson's disease (HCC)    Peripheral neuropathy    Related to chemotherapy   Personal history of chemotherapy 2020   Port-A-Cath in place 06/23/2018   Past Surgical History:  Procedure Laterality Date   APPENDECTOMY  1966   BIOPSY  08/29/2020   Procedure: BIOPSY;  Surgeon: Eartha Angelia Sieving, MD;  [facility];  Service: Gastroenterology;;   BIOPSY  07/24/2022   Procedure: BIOPSY;  Surgeon: Eartha Angelia Sieving, MD;  [facility];  Service: Gastroenterology;;   BREAST BIOPSY Left 08/19/2023   US  LT BREAST BX W LOC DEV 1ST LESION IMG BX SPEC US  GUIDE 08/19/2023 [facility]UL[facility]AS[facility]   BREAST LUMPECTOMY WITH RADIOACTIVE SEED AND SENTINEL LYMPH NODE BIOPSY Left 05/06/2018   Procedure:  LEFT BREAST LUMPECTOMY WITH RADIOACTIVE SEED AND LEFT DEEP AXILLARY SENTINEL LYMPH NODE BIOPSY AND BLUE DYE INJECTION;  Surgeon: Gail Favorite, MD;  Location: [facility];  Service: General;  Laterality: Left;   CATARACT EXTRACTION W/PHACO Left 01/10/2014   Procedure: CATARACT EXTRACTION PHACO AND INTRAOCULAR LENS PLACEMENT ; CDE:  4.94;  Surgeon: Dow JULIANNA Burke, MD;  [facility];  Service: Ophthalmology;  Laterality: Left;   CESAREAN SECTION  1986   CHOLECYSTECTOMY     COLONOSCOPY WITH PROPOFOL  N/A 07/25/2020   Procedure: COLONOSCOPY WITH PROPOFOL ;  Surgeon: Eartha Angelia Sieving, MD;  [facility];  Service: Gastroenterology;  Laterality: N/A;  10:55   ESOPHAGOGASTRODUODENOSCOPY (EGD) WITH PROPOFOL  N/A 08/29/2020   Procedure: ESOPHAGOGASTRODUODENOSCOPY (EGD) WITH PROPOFOL ;  Surgeon: Eartha Angelia Sieving, MD;  [facility];  Service: Gastroenterology;  Laterality: N/A;  12:00   ESOPHAGOGASTRODUODENOSCOPY (EGD) WITH PROPOFOL  N/A 07/24/2022   Procedure: ESOPHAGOGASTRODUODENOSCOPY (EGD) WITH PROPOFOL ;  Surgeon: Eartha Angelia Sieving, MD;  [facility];  Service: Gastroenterology;  Laterality: N/A;  12:45;ASA 1-2   HOT HEMOSTASIS  07/24/2022   Procedure: HOT HEMOSTASIS (ARGON PLASMA COAGULATION/BICAP);  Surgeon: Eartha Angelia, Sieving, MD;  [facility];  Service: Gastroenterology;;   IR 3D INDEPENDENT WKST  04/25/2021   IR ANGIO INTRA EXTRACRAN SEL COM  CAROTID INNOMINATE UNI R MOD SED  04/25/2021   IR ANGIO INTRA EXTRACRAN SEL INTERNAL CAROTID UNI L MOD SED  04/25/2021   IR ANGIOGRAM FOLLOW UP STUDY  04/25/2021   IR CT HEAD LTD  04/25/2021   IR NEURO EACH ADD'L AFTER BASIC UNI LEFT (MS)  04/25/2021   IR RADIOLOGIST EVAL & MGMT  03/22/2021   IR RADIOLOGIST EVAL & MGMT  03/28/2021   IR RADIOLOGIST EVAL & MGMT  05/15/2021   IR RADIOLOGIST EVAL & MGMT  09/28/2021   IR TRANSCATH/EMBOLIZ  04/25/2021   IR US  GUIDE VASC ACCESS RIGHT  04/25/2021    MYRINGOTOMY WITH TUBE PLACEMENT Right 02/04/2017   Procedure: REVISION OF RIGHT MYRINGOTOMY WITH TUBE PLACEMENT, WITH EXAM OF LEFT EAR;  Surgeon: Karis Clunes, MD;  Location: [facility];  Service: ENT;  Laterality: Right;   NASAL SINUS SURGERY  2016   polypectomy   OPEN REDUCTION INTERNAL FIXATION (ORIF) TIBIA/FIBULA FRACTURE Left 02/02/2023   Procedure: OPEN REDUCTION INTERNAL FIXATION (ORIF) LEFT TIBIA/FIBULA FRACTURE;  Surgeon: Georgina Ozell LABOR, MD;  [facility];  Service: Orthopedics;  Laterality: Left;   POLYPECTOMY  07/25/2020   Procedure: POLYPECTOMY;  Surgeon: Eartha Angelia Sieving, MD;  [facility];  Service: Gastroenterology;;   POLYPECTOMY  08/29/2020   Procedure: POLYPECTOMY;  Surgeon: Eartha Angelia Sieving, MD;  [facility];  Service: Gastroenterology;;   POLYPECTOMY  07/24/2022   Procedure: POLYPECTOMY;  Surgeon: Eartha Angelia Sieving, MD;  [facility];  Service: Gastroenterology;;   [facility] REMOVAL N/A 01/06/2019   Procedure: REMOVAL PORT-A-CATH;  Surgeon: Gail Favorite, MD;  Location: [facility];  Service: General;  Laterality: N/A;   PORTACATH PLACEMENT Right 06/16/2018   Procedure: INSERTION PORT-A-CATH;  Surgeon: Gail Favorite, MD;  [facility];  Service: General;  Laterality: Right;   RADIOLOGY WITH ANESTHESIA N/A 04/25/2021   Procedure: IR WITH ANESTHESIA EMBOLIZATION;  Surgeon: Dolphus Carrion, MD;  [facility];  Service: Radiology;  Laterality: N/A;   SCLEROTHERAPY  07/24/2022   Procedure: SCLEROTHERAPY;  Surgeon: Eartha Angelia, Sieving, MD;  [facility];  Service: Gastroenterology;;   SUBMUCOSAL LIFTING INJECTION  08/29/2020   Procedure: SUBMUCOSAL LIFTING INJECTION;  Surgeon: Eartha Angelia, Sieving, MD;  [facility];  Service: Gastroenterology;;   SUBMUCOSAL LIFTING INJECTION  07/24/2022   Procedure: SUBMUCOSAL LIFTING INJECTION;  Surgeon:  Eartha Angelia Sieving, MD;  [facility];  Service: Gastroenterology;;   TONSILLECTOMY  1970   Patient Active Problem List   Diagnosis Date Noted   Closed fracture of left fibula and tibia 02/02/2023   Acute diarrhea 12/23/2022   Functional dyspepsia 07/01/2022   DOE (dyspnea on exertion) 01/15/2022   Nausea with vomiting 05/28/2021   Duodenal adenoma 05/28/2021   Brain aneurysm 04/25/2021   History of herpes simplex infection 01/08/2021   Papanicolaou smear, as part of routine gynecological examination 01/08/2021   CVA (cerebral vascular accident) (HCC) 01/04/2021   Acute ischemic stroke (HCC) 01/03/2021   Hypokalemia 01/03/2021   Cerebral aneurysm 01/03/2021   Musculoskeletal pain 01/03/2021   Early satiety 11/27/2020   Abdominal discomfort 07/13/2020   Malignant neoplasm of upper-inner quadrant of left breast in female, estrogen receptor positive (HCC) 04/16/2018   Essential hypertension 03/21/2015   Hyperlipidemia 03/21/2015   Genital herpes 03/21/2015   Fecal urgency 03/21/2015   GERD (gastroesophageal reflux disease) 03/21/2015    ONSET DATE: ~3 years  REFERRING DIAG: Parkinson's Disease  THERAPY  DIAG:  Other lack of coordination  Other symptoms and signs involving the nervous system  Rationale for Evaluation and Treatment: Rehabilitation  SUBJECTIVE:   SUBJECTIVE STATEMENT: It's getting worse in my hands Pt accompanied by: self  PERTINENT HISTORY: 73 y.o. female who is being followed for left-sided predominant Parkinson's disease. Initially seen by Dr. Buck 11/2019 for several year history of intermittent left hand tremors and exam showed evidence of mild left-sided parkinsonism. DaTscan  02/2020 supportive of diagnosis. Hx of right corona radiata infarct 12/2020 with incidental finding of cerebral aneurysm on workup s/p endovascular treatment with flow diverter device 04/2021. Pt had OT in 12/2022 however then broke her leg and had to discharge and  focus on PT. Pt now returning with worsening parkinson's symptons  PRECAUTIONS: None  WEIGHT BEARING RESTRICTIONS: No  PAIN:  Are you having pain? Yes: NPRS scale: 5/10 Pain location: back Pain description: aching Aggravating factors: standing Relieving factors: resting  FALLS: Has patient fallen in last 6 months? No  LIVING ENVIRONMENT: Lives with: lives with their family and lives with their son Lives in: House/apartment  PLOF: Independent  PATIENT GOALS: To improve control of LUE  OBJECTIVE:  Note: Objective measures were completed at Evaluation unless otherwise noted.  HAND DOMINANCE: Right  ADLs: Overall ADLs: Pt having difficulty with fine motor skills, unable to manipulate buttons, zippers, or clasps, as well as difficulty with bottles and cans requiring assist with most all ADL's and all IADL's.   MOBILITY STATUS: difficulty carrying objections with ambulation  POSTURE COMMENTS:  No Significant postural limitations Sitting balance: Moves/returns truncal midpoint >2 inches in all planes  ACTIVITY TOLERANCE: Activity tolerance: Pt fatigues quickly  UPPER EXTREMITY ROM:    BUE Full ROM  UPPER EXTREMITY MMT:     MMT Right eval Left eval  Shoulder flexion 4+/5 4+/5  Shoulder abduction 4/5 4+/5  Shoulder internal rotation 4+/5 4+/5  Shoulder external rotation 4/5 4/5  Elbow flexion 5/5 5/5  Elbow extension 5/5 5/5  Wrist flexion 5/5 4+/5  Wrist extension 5/5 4+/5  Wrist ulnar deviation 5/5 4+/5  Wrist radial deviation 5/5 5/5  Wrist pronation 5/5 4/5  Wrist supination 5/5 4+/5  (Blank rows = not tested)  HAND FUNCTION: Grip strength: Right: 54 lbs; Left: 46 lbs, Lateral pinch: Right: 13 lbs, Left: 12 lbs, and 3 point pinch: Right: 12 lbs, Left: 10 lbs  COORDINATION: 9 Hole Peg test: Right: 32.25 sec; Left: 26.21 sec  SENSATION: WFL  EDEMA: No swelling noted  COGNITION: Overall cognitive status: No family/caregiver present to determine  baseline cognitive functioning  OBSERVATIONS: Mild constant tremor noted                                                                                                                              TREATMENT DATE:   12/31/23 -Provided information on the Cala watch for tremor relief -reviewed ADL's with extensive problems     PATIENT EDUCATION: Education details:  Cala watch Person educated: Patient Education method: Explanation, Demonstration, and Handouts Education comprehension: verbalized understanding and returned demonstration  HOME EXERCISE PROGRAM: 12/10: Calla Watch   GOALS: Goals reviewed with patient? Yes  SHORT TERM GOALS: Target date: 02/27/24  Pt will be provided and educated on HEP for BUE in order to improve independence with all ADL's and IADL's.   Goal status: INITIAL  2.  Pt will increase BUE strength to 5/5 in order to lift and carry items needed for cleaning tasks.   Goal status: INITIAL  3.  Pt will increase BUE grip strength by 10# and pinch strength by 2# in order to grasp and hold items needed during meal prep.   Goal status: INITIAL  4.  Pt will increase BUE coordination by completing 9 hole peg test in 32 or less in order to manipulate buttons, zippers, and clasps.  Baseline:  Goal status: INITIAL  ASSESSMENT:  CLINICAL IMPRESSION: Patient is a 73 y.o. female who was seen today for occupational therapy evaluation for Parkinson's tremors and weakness. Pt presents with increased weakness, and fatigue, as well as decreased coordination, limiting all ADL's and IADL's.   PERFORMANCE DEFICITS: in functional skills including ADLs, IADLs, coordination, dexterity, tone, ROM, strength, pain, fascial restrictions, Fine motor control, Gross motor control, body mechanics, and UE functional use.  IMPAIRMENTS: are limiting patient from ADLs, IADLs, rest and sleep, leisure, and social participation.   CO-MORBIDITIES: may have co-morbidities  that affects  occupational performance. Patient will benefit from skilled OT to address above impairments and improve overall function.  MODIFICATION OR ASSISTANCE TO COMPLETE EVALUATION: Min-Moderate modification of tasks or assist with assess necessary to complete an evaluation.  OT OCCUPATIONAL PROFILE AND HISTORY: Detailed assessment: Review of records and additional review of physical, cognitive, psychosocial history related to current functional performance.  CLINICAL DECISION MAKING: Moderate - several treatment options, min-mod task modification necessary  REHAB POTENTIAL: Good  EVALUATION COMPLEXITY: Moderate    PLAN:  OT FREQUENCY: 1x/week  OT DURATION: 6 weeks  PLANNED INTERVENTIONS: 97168 OT Re-evaluation, 97535 self care/ADL training, 02889 therapeutic exercise, 97530 therapeutic activity, 97112 neuromuscular re-education, 97140 manual therapy, 97035 ultrasound, 97018 paraffin, 02989 moist heat, 97032 electrical stimulation (manual), passive range of motion, functional mobility training, energy conservation, coping strategies training, patient/family education, and DME and/or AE instructions  RECOMMENDED OTHER SERVICES: PT  CONSULTED AND AGREED WITH PLAN OF CARE: Patient  PLAN FOR NEXT SESSION: Stability tasks, strengthening, grip and pinch strengthening  Valentin Nightingale, OTR/L [facility] [phone] Valentin Jillyn Nightingale, OT 12/31/2023, 4:27 PM

## 2024-01-01 ENCOUNTER — Encounter (HOSPITAL_COMMUNITY): Payer: Self-pay | Admitting: Gastroenterology

## 2024-01-01 NOTE — Progress Notes (Signed)
 Patient result letter mailed procedure note and pathology result faxed to PCP

## 2024-01-09 ENCOUNTER — Ambulatory Visit (HOSPITAL_COMMUNITY)

## 2024-01-13 ENCOUNTER — Ambulatory Visit (HOSPITAL_COMMUNITY): Admitting: Speech Pathology

## 2024-01-13 ENCOUNTER — Other Ambulatory Visit: Payer: Self-pay

## 2024-01-13 ENCOUNTER — Encounter (HOSPITAL_COMMUNITY): Payer: Self-pay | Admitting: Speech Pathology

## 2024-01-13 DIAGNOSIS — R278 Other lack of coordination: Secondary | ICD-10-CM | POA: Diagnosis not present

## 2024-01-13 DIAGNOSIS — R471 Dysarthria and anarthria: Secondary | ICD-10-CM

## 2024-01-13 NOTE — Therapy (Signed)
 " OUTPATIENT SPEECH LANGUAGE PATHOLOGY PARKINSON'S EVALUATION   Patient Name: Kimberly Mccormick MRN: [id number] DOB:[birth year], 73 y.o., female Today's Date: 01/13/2024  PCP: [name], MD REFERRING PROVIDER: Whitfield Raisin, NP  END OF SESSION:  End of Session - 01/13/24 1623     Visit Number 1    Number of Visits 7    Date for Recertification  02/26/24    Authorization Type [employer] Dual Complete    SLP Start Time 1545    SLP Stop Time  1630    SLP Time Calculation (min) 45 min    Activity Tolerance Patient tolerated treatment well          Past Medical History:  Diagnosis Date   Aneurysm of left internal carotid artery    Small supraclinoid 5 mm   Breast cancer (HCC)    Left s/p lumpectomy (05/06/2018) and adjuvant chemotherapy (06/23/2018) with Adriamycin and Cytoxan  x4 followed by Taxol weekly x12   DDD (degenerative disc disease), lumbar    Essential hypertension    Genital warts    GERD (gastroesophageal reflux disease)    History of anemia    History of renal insufficiency    Hypertension    Insomnia    Iron deficiency anemia    Ischemic stroke [facility])    December 2022   Mixed hyperlipidemia    Osteoarthritis    Parkinson's disease (HCC)    Peripheral neuropathy    Related to chemotherapy   Personal history of chemotherapy 2020   Port-A-Cath in place 06/23/2018   Past Surgical History:  Procedure Laterality Date   APPENDECTOMY  1966   BIOPSY  08/29/2020   Procedure: BIOPSY;  Surgeon: Eartha Angelia Sieving, MD;  [facility];  Service: Gastroenterology;;   BIOPSY  07/24/2022   Procedure: BIOPSY;  Surgeon: Eartha Angelia Sieving, MD;  [facility];  Service: Gastroenterology;;   BREAST BIOPSY Left 08/19/2023   US  LT BREAST BX W LOC DEV 1ST LESION IMG BX SPEC US  GUIDE 08/19/2023 [facility]-ULTRASOUND   BREAST LUMPECTOMY WITH RADIOACTIVE SEED AND SENTINEL LYMPH NODE BIOPSY Left 05/06/2018   Procedure: LEFT BREAST LUMPECTOMY WITH  RADIOACTIVE SEED AND LEFT DEEP AXILLARY SENTINEL LYMPH NODE BIOPSY AND BLUE DYE INJECTION;  Surgeon: Gail Favorite, MD;  Location: [facility];  Service: General;  Laterality: Left;   CATARACT EXTRACTION W/PHACO Left 01/10/2014   Procedure: CATARACT EXTRACTION PHACO AND INTRAOCULAR LENS PLACEMENT ; CDE:  4.94;  Surgeon: Dow JULIANNA Burke, MD;  Location: [facility];  Service: Ophthalmology;  Laterality: Left;   CESAREAN SECTION  1986   CHOLECYSTECTOMY     COLONOSCOPY WITH PROPOFOL  N/A 07/25/2020   Procedure: COLONOSCOPY WITH PROPOFOL ;  Surgeon: Eartha Angelia Sieving, MD;  [facility];  Service: Gastroenterology;  Laterality: N/A;  10:55   ESOPHAGOGASTRODUODENOSCOPY N/A 12/30/2023   Procedure: EGD (ESOPHAGOGASTRODUODENOSCOPY);  Surgeon: Eartha Angelia, Sieving, MD;  [facility];  Service: Gastroenterology;  Laterality: N/A;  9:15 am, asa 3   ESOPHAGOGASTRODUODENOSCOPY (EGD) WITH PROPOFOL  N/A 08/29/2020   Procedure: ESOPHAGOGASTRODUODENOSCOPY (EGD) WITH PROPOFOL ;  Surgeon: Eartha Angelia Sieving, MD;  [facility];  Service: Gastroenterology;  Laterality: N/A;  12:00   ESOPHAGOGASTRODUODENOSCOPY (EGD) WITH PROPOFOL  N/A 07/24/2022   Procedure: ESOPHAGOGASTRODUODENOSCOPY (EGD) WITH PROPOFOL ;  Surgeon: Eartha Angelia Sieving, MD;  [facility];  Service: Gastroenterology;  Laterality: N/A;  12:45;ASA 1-2   HOT HEMOSTASIS  07/24/2022   Procedure: HOT HEMOSTASIS (ARGON PLASMA COAGULATION/BICAP);  Surgeon: Eartha  Angelia Sieving, MD;  [facility];  Service: Gastroenterology;;   IR 3D INDEPENDENT WKST  04/25/2021   IR ANGIO INTRA EXTRACRAN SEL COM CAROTID INNOMINATE UNI R MOD SED  04/25/2021   IR ANGIO INTRA EXTRACRAN SEL INTERNAL CAROTID UNI L MOD SED  04/25/2021   IR ANGIOGRAM FOLLOW UP STUDY  04/25/2021   IR CT HEAD LTD  04/25/2021   IR NEURO EACH ADD'L AFTER BASIC UNI LEFT (MS)  04/25/2021   IR RADIOLOGIST EVAL & MGMT  03/22/2021   IR  RADIOLOGIST EVAL & MGMT  03/28/2021   IR RADIOLOGIST EVAL & MGMT  05/15/2021   IR RADIOLOGIST EVAL & MGMT  09/28/2021   IR TRANSCATH/EMBOLIZ  04/25/2021   IR US  GUIDE VASC ACCESS RIGHT  04/25/2021   MYRINGOTOMY WITH TUBE PLACEMENT Right 02/04/2017   Procedure: REVISION OF RIGHT MYRINGOTOMY WITH TUBE PLACEMENT, WITH EXAM OF LEFT EAR;  Surgeon: Karis Clunes, MD;  Location: [facility];  Service: ENT;  Laterality: Right;   NASAL SINUS SURGERY  2016   polypectomy   OPEN REDUCTION INTERNAL FIXATION (ORIF) TIBIA/FIBULA FRACTURE Left 02/02/2023   Procedure: OPEN REDUCTION INTERNAL FIXATION (ORIF) LEFT TIBIA/FIBULA FRACTURE;  Surgeon: Georgina Ozell LABOR, MD;  Location: [facility];  Service: Orthopedics;  Laterality: Left;   POLYPECTOMY  07/25/2020   Procedure: POLYPECTOMY;  Surgeon: Eartha Angelia Sieving, MD;  [facility];  Service: Gastroenterology;;   POLYPECTOMY  08/29/2020   Procedure: POLYPECTOMY;  Surgeon: Eartha Angelia Sieving, MD;  [facility];  Service: Gastroenterology;;   POLYPECTOMY  07/24/2022   Procedure: POLYPECTOMY;  Surgeon: Eartha Angelia Sieving, MD;  [facility];  Service: Gastroenterology;;   [facility] REMOVAL N/A 01/06/2019   Procedure: REMOVAL PORT-A-CATH;  Surgeon: Gail Favorite, MD;  Location: [facility];  Service: General;  Laterality: N/A;   PORTACATH PLACEMENT Right 06/16/2018   Procedure: INSERTION PORT-A-CATH;  Surgeon: Gail Favorite, MD;  Location: [facility];  Service: General;  Laterality: Right;   RADIOLOGY WITH ANESTHESIA N/A 04/25/2021   Procedure: IR WITH ANESTHESIA EMBOLIZATION;  Surgeon: Dolphus Carrion, MD;  Location: [facility];  Service: Radiology;  Laterality: N/A;   SCLEROTHERAPY  07/24/2022   Procedure: SCLEROTHERAPY;  Surgeon: Eartha Angelia, Sieving, MD;  [facility];  Service: Gastroenterology;;   SUBMUCOSAL LIFTING INJECTION  08/29/2020   Procedure: SUBMUCOSAL LIFTING  INJECTION;  Surgeon: Eartha Angelia, Sieving, MD;  [facility];  Service: Gastroenterology;;   SUBMUCOSAL LIFTING INJECTION  07/24/2022   Procedure: SUBMUCOSAL LIFTING INJECTION;  Surgeon: Eartha Angelia Sieving, MD;  [facility];  Service: Gastroenterology;;   TONSILLECTOMY  1970   Patient Active Problem List   Diagnosis Date Noted   Closed fracture of left fibula and tibia 02/02/2023   Acute diarrhea 12/23/2022   Functional dyspepsia 07/01/2022   DOE (dyspnea on exertion) 01/15/2022   Nausea with vomiting 05/28/2021   Duodenal adenoma 05/28/2021   Brain aneurysm 04/25/2021   History of herpes simplex infection 01/08/2021   Papanicolaou smear, as part of routine gynecological examination 01/08/2021   CVA (cerebral vascular accident) (HCC) 01/04/2021   Acute ischemic stroke (HCC) 01/03/2021   Hypokalemia 01/03/2021   Cerebral aneurysm 01/03/2021   Musculoskeletal pain 01/03/2021   Early satiety 11/27/2020   Abdominal discomfort 07/13/2020   Malignant neoplasm of upper-inner quadrant of left breast in female, estrogen receptor positive (HCC) 04/16/2018   Essential hypertension 03/21/2015   Hyperlipidemia 03/21/2015   Genital herpes  03/21/2015   Fecal urgency 03/21/2015   GERD (gastroesophageal reflux disease) 03/21/2015    ONSET DATE: ~2021  REFERRING DIAG: G20.A1 (ICD-10-CM) - Parkinson's disease without dyskinesia or fluctuating manifestations (HCC)  THERAPY DIAG:  Dysarthria  Rationale for Evaluation and Treatment: Rehabilitation  SUBJECTIVE:   SUBJECTIVE STATEMENT: People say they can't hear me when I talk.  Pt accompanied by: self  PERTINENT HISTORY: Kimberly Mccormick is a 73 y.o. female who is referred by Harlene Bogaert, NP (neurology) for SLP evaluation and treatment in setting of left-sided predominant Parkinson's disease.  Initially seen by Dr. Buck 11/2019 for several year history of intermittent left hand tremors and exam showed  evidence of mild left-sided parkinsonism.  DaTscan  02/2020 supportive of diagnosis. Hx of right corona radiata infarct 12/2020 with incidental finding of cerebral aneurysm on workup s/p endovascular treatment with flow diverter device 04/2021 by Dr. Dolphus. She was evaluated and seen for one treatment session for SLP a year ago, but had to cancel her appointments due to a fall on ice.  PAIN:  Are you having pain? No  FALLS: Has patient fallen in last 6 months?  No  LIVING ENVIRONMENT: Lives with: lives with their family and lives with their son Lives in: House/apartment  PLOF:  Level of assistance: Independent with ADLs, Independent with IADLs Employment: Retired  PATIENT GOALS: Increase loudness for speech  OBJECTIVE:  Note: Objective measures were completed at Evaluation unless otherwise noted.  DIAGNOSTIC FINDINGS:  04/15/2022 MRI HEAD FINDINGS IMPRESSION: 1. No acute brain finding. Moderate chronic small-vessel ischemic changes of the cerebral hemispheric white matter. 2. Left internal carotid artery flow diverting stent. No residual visualized flow in the region of the medially directed aneurysm. 3. 4 mm aneurysm projecting laterally from the petrous carotid on the left. No change.   02/23/2020 NUCLEAR MEDICINE BRAIN IMAGING WITH SPECT (DaTscan  ) IMPRESSION: Significant decreased striatal Ioflupane activity most prominent in the posterior striatum. This pattern can be seen in Parkinsonian syndromes.  COGNITION: Overall cognitive status: Impaired: Areas of impairment:  Memory: Impaired: Working Teacher, Music term  Areas of impairment: Memory Comments: 20/30 on the [facility] SLUMS  MOTOR SPEECH: Overall motor speech: impaired Level of impairment: Conversation Respiration: diaphragmatic/abdominal breathing Phonation: normal and mild glottal fry Resonance: WFL Articulation: Appears intact Intelligibility: Intelligible Motor planning: Appears intact Motor speech errors:  N/A Interfering components: fluctuation throughout the day likely Effective technique: increased vocal intensity  ORAL MOTOR EXAMINATION: Overall status: WFL Comments: N/A and Pt reports no current dysphagia symptoms  OBJECTIVE VOICE ASSESSMENT: Sustained ah maximum phonation time: 17.7 seconds Sustained ah loudness average: 92 dB Oral reading (passage) loudness average: 75 dB Conversational loudness average: 70 dB Voice quality: occasional glottal fry observed Stimulability trials: Given SLP modeling and occasional min cues, loudness average increased to 75dB at conversation level.  Comments: Pt automatically increased vocal intensity during recorded and microphone displayed tasks  Completed audio recording of patients baseline voice without cueing from SLP: Yes  Pt does not report difficulty with swallowing which does not warrant further evaluation.   [facility] SLUMS Examination Orientation  3/3  Numeric Problem Solving  3/3  Memory  2/5  Attention 2/2  Thought Organization 2/3 (14 animals in one minute)  Clock Drawing 2/4  Visuospatial Skills               2/2  Short Story Recall  4/8  Total  20/30 (was 22/30 a year ago)    [employer] Education  Less than  High School Education   Normal  27-30 25-30  Mild Neurocognitive Disorder 21-26 20-24  Dementia  1-20 1-19                                                                                                                              TREATMENT DATE: 01/13/2024 Evaluation completed this date; Plan for SLP therapy to address hypokinetic dysarthria with a  focus in implementing SPEAKOUT! Therapy techniques.  PATIENT EDUCATION: Education details: Pt to bring her SPEAKOUT! Therapy notebook next session and encouraged to speak with intent. Person educated: Patient Education method: Medical Illustrator Education comprehension: verbalized understanding  HOME EXERCISE PROGRAM: Pt will completed HEP as  assigned to facilitate carryover of treatment strategies and techniques in home and community environment with written cues.  GOALS: Goals reviewed with patient? Yes   SHORT TERM GOALS: Target date: 02/26/2024   Pt will coordinate vocal and articulatory subsystems in hierarchical speech tasks by producing sounds with intention with min assistance. Baseline: introduced this date, min/mod Goal status: INITIAL   2.  Generalize intentional speech to cognitive-linguistic exercises and conversational speech with improved vocal quality, loudness, articulatory precision, and endurance while maintaining a minimum of 85 dB with min assistance. Baseline: 68 dB Goal status: INITIAL   3.  Read phrases, sentences, and paragraphs with intention, yielding improved vocal quality, loudness, articulatory precision, and endurance while maintaining a minimum of 85 dB with min assistance. Baseline: 75 dB Goal status: INITIAL   LONG TERM GOALS: Target date: 02/2024   The patient will increase intensity in conversational speech allowing them to converse in the community, increase voice use, slow progression of vocal deterioration, and improve their QOL with indirect cues prn. Baseline: min/mod cues Goal status: INITIAL  ASSESSMENT:  CLINICAL IMPRESSION: Patient is a 73 y.o. female who was seen today for a cognitive linguistic evaluation in setting of Parkinson's disease. Pt presents with mild cognitive linguistic deficits ([facility] SLUMS 20/30) and mild hypokinetic dysarthria characterized by attention and working memory deficits, hoarse/strained vocal quality with glottal fry and fluctuations in loudness. Pt reports feeling as if she is not talking loud enough and has to make an effort to speak louder. While she sustained /a/ for 18 seconds at a level of 92 dB, her volume in conversation was sub optimal at ~68 dB. Pt counted from 1-10 with average of 68 dB and when cued for increased vocal intensity and intent,  this improved to 82 dB. Pt is motivated to improve her speech and showed improvement when cues provided by SLP. Recommend SPEAKOUT! Therapy for 6+ sessions and plan for discharge to a group setting to continue and maintain gains. Pt is in agreement with plan of care.  OBJECTIVE IMPAIRMENTS: Objective impairments include memory and dysarthria. These impairments are limiting patient from effectively communicating at home and in community.Factors affecting potential to achieve goals and functional outcome are previous level of function.. Patient will benefit from skilled SLP services  to address above impairments and improve overall function.  REHAB POTENTIAL: Excellent  PLAN:  SLP FREQUENCY: 1-2x/week  SLP DURATION: 4 weeks  PLANNED INTERVENTIONS: Cueing hierachy, Internal/external aids, Multimodal communication approach, SLP instruction and feedback, Compensatory strategies, Patient/family education, [phone] Treatment of speech (30 or 45 min) , and [id number]- Speech [address], Artic, Phon, Eval Compre, Express   Thank you,  Lamar Candy, CCC-SLP [phone]  Daeveon Zweber, CCC-SLP 01/13/2024, 4:39 PM      "

## 2024-01-19 ENCOUNTER — Ambulatory Visit: Admitting: Orthopedic Surgery

## 2024-01-21 ENCOUNTER — Ambulatory Visit (HOSPITAL_COMMUNITY): Admitting: Speech Pathology

## 2024-01-21 ENCOUNTER — Ambulatory Visit (HOSPITAL_COMMUNITY): Admitting: Occupational Therapy

## 2024-01-27 ENCOUNTER — Encounter (HOSPITAL_COMMUNITY): Payer: Self-pay | Admitting: Occupational Therapy

## 2024-01-27 ENCOUNTER — Ambulatory Visit (HOSPITAL_COMMUNITY): Attending: Adult Health | Admitting: Occupational Therapy

## 2024-01-27 DIAGNOSIS — R278 Other lack of coordination: Secondary | ICD-10-CM | POA: Insufficient documentation

## 2024-01-27 DIAGNOSIS — R2689 Other abnormalities of gait and mobility: Secondary | ICD-10-CM | POA: Insufficient documentation

## 2024-01-27 DIAGNOSIS — R29818 Other symptoms and signs involving the nervous system: Secondary | ICD-10-CM | POA: Insufficient documentation

## 2024-01-27 DIAGNOSIS — M6281 Muscle weakness (generalized): Secondary | ICD-10-CM | POA: Insufficient documentation

## 2024-01-27 DIAGNOSIS — R471 Dysarthria and anarthria: Secondary | ICD-10-CM | POA: Insufficient documentation

## 2024-01-27 NOTE — Therapy (Signed)
 " OUTPATIENT OCCUPATIONAL THERAPY NEURO TREATMENT NOTE  Patient Name: Kimberly Mccormick MRN: [id number] DOB:09-21-1950, 74 y.o., female Today's Date: 01/27/2024  PCP: Lari Standing, MD REFERRING PROVIDER: Whitfield Raisin, NP  END OF SESSION:  OT End of Session - 01/27/24 1420     Visit Number 2    Number of Visits 6    Date for Recertification  02/27/24    Authorization Type [employer] Dual Complete    OT Start Time 1310    OT Stop Time 1351    OT Time Calculation (min) 41 min    Activity Tolerance Patient tolerated treatment well    Behavior During Therapy WFL for tasks assessed/performed          Past Medical History:  Diagnosis Date   Aneurysm of left internal carotid artery    Small supraclinoid 5 mm   Breast cancer (HCC)    Left s/p lumpectomy (05/06/2018) and adjuvant chemotherapy (06/23/2018) with Adriamycin and Cytoxan  x4 followed by Taxol weekly x12   DDD (degenerative disc disease), lumbar    Essential hypertension    Genital warts    GERD (gastroesophageal reflux disease)    History of anemia    History of renal insufficiency    Hypertension    Insomnia    Iron deficiency anemia    Ischemic stroke [facility])    December 2022   Mixed hyperlipidemia    Osteoarthritis    Parkinson's disease (HCC)    Peripheral neuropathy    Related to chemotherapy   Personal history of chemotherapy 2020   Port-A-Cath in place 06/23/2018   Past Surgical History:  Procedure Laterality Date   APPENDECTOMY  1966   BIOPSY  08/29/2020   Procedure: BIOPSY;  Surgeon: Eartha Angelia Sieving, MD;  [facility];  Service: Gastroenterology;;   BIOPSY  07/24/2022   Procedure: BIOPSY;  Surgeon: Eartha Angelia Sieving, MD;  [facility];  Service: Gastroenterology;;   BREAST BIOPSY Left 08/19/2023   US  LT BREAST BX W LOC DEV 1ST LESION IMG BX SPEC US  GUIDE 08/19/2023 [facility]-ULTRASOUND   BREAST LUMPECTOMY WITH RADIOACTIVE SEED AND SENTINEL LYMPH NODE BIOPSY Left 05/06/2018    Procedure: LEFT BREAST LUMPECTOMY WITH RADIOACTIVE SEED AND LEFT DEEP AXILLARY SENTINEL LYMPH NODE BIOPSY AND BLUE DYE INJECTION;  Surgeon: Gail Favorite, MD;  Location: [facility];  Service: General;  Laterality: Left;   CATARACT EXTRACTION W/PHACO Left 01/10/2014   Procedure: CATARACT EXTRACTION PHACO AND INTRAOCULAR LENS PLACEMENT ; CDE:  4.94;  Surgeon: Dow JULIANNA Burke, MD;  Location: [facility];  Service: Ophthalmology;  Laterality: Left;   CESAREAN SECTION  1986   CHOLECYSTECTOMY     COLONOSCOPY WITH PROPOFOL  N/A 07/25/2020   Procedure: COLONOSCOPY WITH PROPOFOL ;  Surgeon: Eartha Angelia Sieving, MD;  [facility];  Service: Gastroenterology;  Laterality: N/A;  10:55   ESOPHAGOGASTRODUODENOSCOPY N/A 12/30/2023   Procedure: EGD (ESOPHAGOGASTRODUODENOSCOPY);  Surgeon: Eartha Angelia, Sieving, MD;  [facility];  Service: Gastroenterology;  Laterality: N/A;  9:15 am, asa 3   ESOPHAGOGASTRODUODENOSCOPY (EGD) WITH PROPOFOL  N/A 08/29/2020   Procedure: ESOPHAGOGASTRODUODENOSCOPY (EGD) WITH PROPOFOL ;  Surgeon: Eartha Angelia Sieving, MD;  [facility];  Service: Gastroenterology;  Laterality: N/A;  12:00   ESOPHAGOGASTRODUODENOSCOPY (EGD) WITH PROPOFOL  N/A 07/24/2022   Procedure: ESOPHAGOGASTRODUODENOSCOPY (EGD) WITH PROPOFOL ;  Surgeon: Eartha Angelia Sieving, MD;  [facility];  Service: Gastroenterology;  Laterality: N/A;  12:45;ASA 1-2   HOT HEMOSTASIS  07/24/2022   Procedure: HOT  HEMOSTASIS (ARGON PLASMA COAGULATION/BICAP);  Surgeon: Eartha Flavors, Toribio, MD;  [facility];  Service: Gastroenterology;;   IR 3D INDEPENDENT WKST  04/25/2021   IR ANGIO INTRA EXTRACRAN SEL COM CAROTID INNOMINATE UNI R MOD SED  04/25/2021   IR ANGIO INTRA EXTRACRAN SEL INTERNAL CAROTID UNI L MOD SED  04/25/2021   IR ANGIOGRAM FOLLOW UP STUDY  04/25/2021   IR CT HEAD LTD  04/25/2021   IR NEURO EACH ADD'L AFTER BASIC UNI LEFT (MS)  04/25/2021   IR  RADIOLOGIST EVAL & MGMT  03/22/2021   IR RADIOLOGIST EVAL & MGMT  03/28/2021   IR RADIOLOGIST EVAL & MGMT  05/15/2021   IR RADIOLOGIST EVAL & MGMT  09/28/2021   IR TRANSCATH/EMBOLIZ  04/25/2021   IR US  GUIDE VASC ACCESS RIGHT  04/25/2021   MYRINGOTOMY WITH TUBE PLACEMENT Right 02/04/2017   Procedure: REVISION OF RIGHT MYRINGOTOMY WITH TUBE PLACEMENT, WITH EXAM OF LEFT EAR;  Surgeon: Karis Clunes, MD;  Location: [facility];  Service: ENT;  Laterality: Right;   NASAL SINUS SURGERY  2016   polypectomy   OPEN REDUCTION INTERNAL FIXATION (ORIF) TIBIA/FIBULA FRACTURE Left 02/02/2023   Procedure: OPEN REDUCTION INTERNAL FIXATION (ORIF) LEFT TIBIA/FIBULA FRACTURE;  Surgeon: Georgina Ozell LABOR, MD;  Location: [facility];  Service: Orthopedics;  Laterality: Left;   POLYPECTOMY  07/25/2020   Procedure: POLYPECTOMY;  Surgeon: Eartha Flavors Toribio, MD;  [facility];  Service: Gastroenterology;;   POLYPECTOMY  08/29/2020   Procedure: POLYPECTOMY;  Surgeon: Eartha Flavors Toribio, MD;  [facility];  Service: Gastroenterology;;   POLYPECTOMY  07/24/2022   Procedure: POLYPECTOMY;  Surgeon: Eartha Flavors Toribio, MD;  [facility];  Service: Gastroenterology;;   [facility] REMOVAL N/A 01/06/2019   Procedure: REMOVAL PORT-A-CATH;  Surgeon: Gail Favorite, MD;  Location: [facility];  Service: General;  Laterality: N/A;   PORTACATH PLACEMENT Right 06/16/2018   Procedure: INSERTION PORT-A-CATH;  Surgeon: Gail Favorite, MD;  Location: [facility];  Service: General;  Laterality: Right;   RADIOLOGY WITH ANESTHESIA N/A 04/25/2021   Procedure: IR WITH ANESTHESIA EMBOLIZATION;  Surgeon: Dolphus Carrion, MD;  Location: [facility];  Service: Radiology;  Laterality: N/A;   SCLEROTHERAPY  07/24/2022   Procedure: SCLEROTHERAPY;  Surgeon: Eartha Flavors, Toribio, MD;  [facility];  Service: Gastroenterology;;   SUBMUCOSAL LIFTING INJECTION  08/29/2020    Procedure: SUBMUCOSAL LIFTING INJECTION;  Surgeon: Eartha Flavors, Toribio, MD;  [facility];  Service: Gastroenterology;;   SUBMUCOSAL LIFTING INJECTION  07/24/2022   Procedure: SUBMUCOSAL LIFTING INJECTION;  Surgeon: Eartha Flavors Toribio, MD;  [facility];  Service: Gastroenterology;;   TONSILLECTOMY  1970   Patient Active Problem List   Diagnosis Date Noted   Closed fracture of left fibula and tibia 02/02/2023   Acute diarrhea 12/23/2022   Functional dyspepsia 07/01/2022   DOE (dyspnea on exertion) 01/15/2022   Nausea with vomiting 05/28/2021   Duodenal adenoma 05/28/2021   Brain aneurysm 04/25/2021   History of herpes simplex infection 01/08/2021   Papanicolaou smear, as part of routine gynecological examination 01/08/2021   CVA (cerebral vascular accident) (HCC) 01/04/2021   Acute ischemic stroke (HCC) 01/03/2021   Hypokalemia 01/03/2021   Cerebral aneurysm 01/03/2021   Musculoskeletal pain 01/03/2021   Early satiety 11/27/2020   Abdominal discomfort 07/13/2020   Malignant neoplasm of upper-inner quadrant of left breast in female, estrogen receptor positive (HCC) 04/16/2018   Essential hypertension 03/21/2015  Hyperlipidemia 03/21/2015   Genital herpes 03/21/2015   Fecal urgency 03/21/2015   GERD (gastroesophageal reflux disease) 03/21/2015    ONSET DATE: ~3 years  REFERRING DIAG: Parkinson's Disease  THERAPY DIAG:  Other lack of coordination  Other symptoms and signs involving the nervous system  Rationale for Evaluation and Treatment: Rehabilitation  SUBJECTIVE:   SUBJECTIVE STATEMENT: It's getting worse in my hands Pt accompanied by: self  PERTINENT HISTORY: 74 y.o. female who is being followed for left-sided predominant Parkinson's disease. Initially seen by Dr. Buck 11/2019 for several year history of intermittent left hand tremors and exam showed evidence of mild left-sided parkinsonism. DaTscan  02/2020 supportive of  diagnosis. Hx of right corona radiata infarct 12/2020 with incidental finding of cerebral aneurysm on workup s/p endovascular treatment with flow diverter device 04/2021. Pt had OT in 12/2022 however then broke her leg and had to discharge and focus on PT. Pt now returning with worsening parkinson's symptons  PRECAUTIONS: None  WEIGHT BEARING RESTRICTIONS: No  PAIN:  Are you having pain? Yes: NPRS scale: 5/10 Pain location: back Pain description: aching Aggravating factors: standing Relieving factors: resting  FALLS: Has patient fallen in last 6 months? No  LIVING ENVIRONMENT: Lives with: lives with their family and lives with their son Lives in: House/apartment  PLOF: Independent  PATIENT GOALS: To improve control of LUE  OBJECTIVE:  Note: Objective measures were completed at Evaluation unless otherwise noted.  HAND DOMINANCE: Right  ADLs: Overall ADLs: Pt having difficulty with fine motor skills, unable to manipulate buttons, zippers, or clasps, as well as difficulty with bottles and cans requiring assist with most all ADL's and all IADL's.   MOBILITY STATUS: difficulty carrying objections with ambulation  POSTURE COMMENTS:  No Significant postural limitations Sitting balance: Moves/returns truncal midpoint >2 inches in all planes  ACTIVITY TOLERANCE: Activity tolerance: Pt fatigues quickly  UPPER EXTREMITY ROM:    BUE Full ROM  UPPER EXTREMITY MMT:     MMT Right eval Left eval  Shoulder flexion 4+/5 4+/5  Shoulder abduction 4/5 4+/5  Shoulder internal rotation 4+/5 4+/5  Shoulder external rotation 4/5 4/5  Elbow flexion 5/5 5/5  Elbow extension 5/5 5/5  Wrist flexion 5/5 4+/5  Wrist extension 5/5 4+/5  Wrist ulnar deviation 5/5 4+/5  Wrist radial deviation 5/5 5/5  Wrist pronation 5/5 4/5  Wrist supination 5/5 4+/5  (Blank rows = not tested)  HAND FUNCTION: Grip strength: Right: 54 lbs; Left: 46 lbs, Lateral pinch: Right: 13 lbs, Left: 12 lbs, and 3  point pinch: Right: 12 lbs, Left: 10 lbs  COORDINATION: 9 Hole Peg test: Right: 32.25 sec; Left: 26.21 sec  SENSATION: WFL  EDEMA: No swelling noted  COGNITION: Overall cognitive status: No family/caregiver present to determine baseline cognitive functioning  OBSERVATIONS: Mild constant tremor noted  TREATMENT DATE:   01/27/24 -Strengthening: 2#, shoulder flexion, shoulder abduction, protraction, horizontal abduction, er/IR, hammer curls, bicep curls, x15 -Wrist Strengthening: 2#, flexion, extension, ulnar/radial deviation, supination/pronation, x15 -Gripper: BUE 35# medium beads, 37# medium beads (pain in L thumb at this weight) -Grooved Peg Board: 12 pegs BUE -writing with a normal pen and a built up handle  12/31/23 -Provided information on the Cala watch for tremor relief -reviewed ADL's with extensive problems     PATIENT EDUCATION: Education details: Shoulder and It Sales Professional Person educated: Patient Education method: Explanation, Demonstration, and Handouts Education comprehension: verbalized understanding and returned demonstration  HOME EXERCISE PROGRAM: 12/10: Calla Watch 1/6: Shoulder and wrist strengthening   GOALS: Goals reviewed with patient? Yes  SHORT TERM GOALS: Target date: 02/27/24  Pt will be provided and educated on HEP for BUE in order to improve independence with all ADL's and IADL's.   Goal status: INITIAL  2.  Pt will increase BUE strength to 5/5 in order to lift and carry items needed for cleaning tasks.   Goal status: INITIAL  3.  Pt will increase BUE grip strength by 10# and pinch strength by 2# in order to grasp and hold items needed during meal prep.   Goal status: INITIAL  4.  Pt will increase BUE coordination by completing 9 hole peg test in 32 or less in order to manipulate buttons, zippers, and  clasps.  Baseline:  Goal status: INITIAL  ASSESSMENT:  CLINICAL IMPRESSION: This session pt worked on her overall strengthening, which she tolerated well with mild to moderate fatigue. Pt also worked on her fine motor skills with grooved pegs and writing. Overall coordination is good, however pt reports that her writing continues to be messier than before. OT providing verbal and tactile cuing for positioning and technique throughout session.   PERFORMANCE DEFICITS: in functional skills including ADLs, IADLs, coordination, dexterity, tone, ROM, strength, pain, fascial restrictions, Fine motor control, Gross motor control, body mechanics, and UE functional use.   PLAN:  OT FREQUENCY: 1x/week  OT DURATION: 6 weeks  PLANNED INTERVENTIONS: 97168 OT Re-evaluation, 97535 self care/ADL training, 02889 therapeutic exercise, 97530 therapeutic activity, 97112 neuromuscular re-education, 97140 manual therapy, 97035 ultrasound, 97018 paraffin, 02989 moist heat, 97032 electrical stimulation (manual), passive range of motion, functional mobility training, energy conservation, coping strategies training, patient/family education, and DME and/or AE instructions  RECOMMENDED OTHER SERVICES: PT  CONSULTED AND AGREED WITH PLAN OF CARE: Patient  PLAN FOR NEXT SESSION: Stability tasks, strengthening, grip and pinch strengthening  Valentin Nightingale, OTR/L [facility] [phone] Adrianna Dudas Jillyn Nightingale, OT 01/27/2024, 2:21 PM    "

## 2024-01-27 NOTE — Patient Instructions (Signed)
 Repeat all exercises 10-15 times, 1-2 times per day.  1) Shoulder Protraction    Begin with elbows by your side, slowly punch straight out in front of you.      2) Shoulder Flexion  Supine:     Standing:         Begin with arms at your side with thumbs pointed up, slowly raise both arms up and forward towards overhead.               3) Horizontal abduction/adduction  Supine:   Standing:           Begin with arms straight out in front of you, bring out to the side in at T shape. Keep arms straight entire time.                 4) Internal & External Rotation   Supine:     Standing:     Stand with elbows at the side and elbows bent 90 degrees. Move your forearms away from your body, then bring back inward toward the body.     5) Shoulder Abduction  Supine:     Standing:       Lying on your back begin with your arms flat on the table next to your side. Slowly move your arms out to the side so that they go overhead, in a jumping jack or snow angel movement.   Strengthening Exercises  1) WRIST EXTENSION CURLS - TABLE  Hold a small free weight, rest your forearm on a table and bend your wrist up and down with your palm face down as shown.      2) WRIST FLEXION CURLS - TABLE  Hold a small free weight, rest your forearm on a table and bend your wrist up and down with your palm face up as shown.     3) FREE WEIGHT RADIAL/ULNAR DEVIATION - TABLE  Hold a small free weight, rest your forearm on a table and bend your wrist up and down with your palm facing towards the side as shown.     4) Pronation  Forearm supported on table with wrist in neutral position. Using a weight, roll wrist so that palm faces downward. Hold for 2 seconds and return to starting position.     5) Supination  Forearm supported on table with wrist in neutral position. Using a weight, roll wrist so that palm is now facing upward. Hold for 2 seconds  and return to starting position.      *Complete exercises using 2 pound weight, 10-15 times each, 1-2 times per day*

## 2024-01-29 ENCOUNTER — Ambulatory Visit (HOSPITAL_COMMUNITY)

## 2024-01-29 ENCOUNTER — Encounter (HOSPITAL_COMMUNITY): Payer: Self-pay

## 2024-01-29 DIAGNOSIS — R2689 Other abnormalities of gait and mobility: Secondary | ICD-10-CM

## 2024-01-29 DIAGNOSIS — M6281 Muscle weakness (generalized): Secondary | ICD-10-CM

## 2024-01-29 NOTE — Therapy (Signed)
 " OUTPATIENT PHYSICAL THERAPY NEURO EVALUATION   Patient Name: Kimberly Mccormick MRN: [id number] DOB:11-01-1950, 74 y.o., female Today's Date: 01/29/2024   PCP: [name], MD  REFERRING PROVIDER: Whitfield Raisin, NP   END OF SESSION:  PT End of Session - 01/29/24 1417     Visit Number 1    Number of Visits 8    Date for Recertification  03/19/24    Authorization Type [employer]    Authorization Time Period no authorization required    PT Start Time 1418    PT Stop Time 1451    PT Time Calculation (min) 33 min    Activity Tolerance Patient tolerated treatment well    Behavior During Therapy WFL for tasks assessed/performed          Past Medical History:  Diagnosis Date   Aneurysm of left internal carotid artery    Small supraclinoid 5 mm   Breast cancer (HCC)    Left s/p lumpectomy (05/06/2018) and adjuvant chemotherapy (06/23/2018) with Adriamycin and Cytoxan  x4 followed by Taxol weekly x12   DDD (degenerative disc disease), lumbar    Essential hypertension    Genital warts    GERD (gastroesophageal reflux disease)    History of anemia    History of renal insufficiency    Hypertension    Insomnia    Iron deficiency anemia    Ischemic stroke [facility])    December 2022   Mixed hyperlipidemia    Osteoarthritis    Parkinson's disease (HCC)    Peripheral neuropathy    Related to chemotherapy   Personal history of chemotherapy 2020   Port-A-Cath in place 06/23/2018   Past Surgical History:  Procedure Laterality Date   APPENDECTOMY  1966   BIOPSY  08/29/2020   Procedure: BIOPSY;  Surgeon: Eartha Angelia Sieving, MD;  [facility];  Service: Gastroenterology;;   BIOPSY  07/24/2022   Procedure: BIOPSY;  Surgeon: Eartha Angelia Sieving, MD;  [facility];  Service: Gastroenterology;;   BREAST BIOPSY Left 08/19/2023   US  LT BREAST BX W LOC DEV 1ST LESION IMG BX SPEC US  GUIDE 08/19/2023 [facility]-ULTRASOUND   BREAST LUMPECTOMY WITH RADIOACTIVE SEED  AND SENTINEL LYMPH NODE BIOPSY Left 05/06/2018   Procedure: LEFT BREAST LUMPECTOMY WITH RADIOACTIVE SEED AND LEFT DEEP AXILLARY SENTINEL LYMPH NODE BIOPSY AND BLUE DYE INJECTION;  Surgeon: Gail Favorite, MD;  Location: [facility];  Service: General;  Laterality: Left;   CATARACT EXTRACTION W/PHACO Left 01/10/2014   Procedure: CATARACT EXTRACTION PHACO AND INTRAOCULAR LENS PLACEMENT ; CDE:  4.94;  Surgeon: Dow JULIANNA Burke, MD;  Location: [facility];  Service: Ophthalmology;  Laterality: Left;   CESAREAN SECTION  1986   CHOLECYSTECTOMY     COLONOSCOPY WITH PROPOFOL  N/A 07/25/2020   Procedure: COLONOSCOPY WITH PROPOFOL ;  Surgeon: Eartha Angelia Sieving, MD;  [facility];  Service: Gastroenterology;  Laterality: N/A;  10:55   ESOPHAGOGASTRODUODENOSCOPY N/A 12/30/2023   Procedure: EGD (ESOPHAGOGASTRODUODENOSCOPY);  Surgeon: Eartha Angelia, Sieving, MD;  [facility];  Service: Gastroenterology;  Laterality: N/A;  9:15 am, asa 3   ESOPHAGOGASTRODUODENOSCOPY (EGD) WITH PROPOFOL  N/A 08/29/2020   Procedure: ESOPHAGOGASTRODUODENOSCOPY (EGD) WITH PROPOFOL ;  Surgeon: Eartha Angelia Sieving, MD;  [facility];  Service: Gastroenterology;  Laterality: N/A;  12:00   ESOPHAGOGASTRODUODENOSCOPY (EGD) WITH PROPOFOL  N/A 07/24/2022   Procedure: ESOPHAGOGASTRODUODENOSCOPY (EGD) WITH PROPOFOL ;  Surgeon: Eartha Angelia Sieving, MD;  [facility];  Service: Gastroenterology;  Laterality: N/A;  12:45;ASA 1-2   HOT HEMOSTASIS  07/24/2022   Procedure: HOT HEMOSTASIS (ARGON PLASMA COAGULATION/BICAP);  Surgeon: Eartha Flavors, Toribio, MD;  [facility];  Service: Gastroenterology;;   IR 3D INDEPENDENT WKST  04/25/2021   IR ANGIO INTRA EXTRACRAN SEL COM CAROTID INNOMINATE UNI R MOD SED  04/25/2021   IR ANGIO INTRA EXTRACRAN SEL INTERNAL CAROTID UNI L MOD SED  04/25/2021   IR ANGIOGRAM FOLLOW UP STUDY  04/25/2021   IR CT HEAD LTD  04/25/2021   IR NEURO EACH ADD'L  AFTER BASIC UNI LEFT (MS)  04/25/2021   IR RADIOLOGIST EVAL & MGMT  03/22/2021   IR RADIOLOGIST EVAL & MGMT  03/28/2021   IR RADIOLOGIST EVAL & MGMT  05/15/2021   IR RADIOLOGIST EVAL & MGMT  09/28/2021   IR TRANSCATH/EMBOLIZ  04/25/2021   IR US  GUIDE VASC ACCESS RIGHT  04/25/2021   MYRINGOTOMY WITH TUBE PLACEMENT Right 02/04/2017   Procedure: REVISION OF RIGHT MYRINGOTOMY WITH TUBE PLACEMENT, WITH EXAM OF LEFT EAR;  Surgeon: Karis Clunes, MD;  Location: [facility];  Service: ENT;  Laterality: Right;   NASAL SINUS SURGERY  2016   polypectomy   OPEN REDUCTION INTERNAL FIXATION (ORIF) TIBIA/FIBULA FRACTURE Left 02/02/2023   Procedure: OPEN REDUCTION INTERNAL FIXATION (ORIF) LEFT TIBIA/FIBULA FRACTURE;  Surgeon: Georgina Ozell LABOR, MD;  Location: [facility];  Service: Orthopedics;  Laterality: Left;   POLYPECTOMY  07/25/2020   Procedure: POLYPECTOMY;  Surgeon: Eartha Flavors Toribio, MD;  [facility];  Service: Gastroenterology;;   POLYPECTOMY  08/29/2020   Procedure: POLYPECTOMY;  Surgeon: Eartha Flavors Toribio, MD;  [facility];  Service: Gastroenterology;;   POLYPECTOMY  07/24/2022   Procedure: POLYPECTOMY;  Surgeon: Eartha Flavors Toribio, MD;  [facility];  Service: Gastroenterology;;   [facility] REMOVAL N/A 01/06/2019   Procedure: REMOVAL PORT-A-CATH;  Surgeon: Gail Favorite, MD;  Location: [facility];  Service: General;  Laterality: N/A;   PORTACATH PLACEMENT Right 06/16/2018   Procedure: INSERTION PORT-A-CATH;  Surgeon: Gail Favorite, MD;  Location: [facility];  Service: General;  Laterality: Right;   RADIOLOGY WITH ANESTHESIA N/A 04/25/2021   Procedure: IR WITH ANESTHESIA EMBOLIZATION;  Surgeon: Dolphus Carrion, MD;  Location: [facility];  Service: Radiology;  Laterality: N/A;   SCLEROTHERAPY  07/24/2022   Procedure: SCLEROTHERAPY;  Surgeon: Eartha Flavors, Toribio, MD;  [facility];  Service: Gastroenterology;;    SUBMUCOSAL LIFTING INJECTION  08/29/2020   Procedure: SUBMUCOSAL LIFTING INJECTION;  Surgeon: Eartha Flavors, Toribio, MD;  [facility];  Service: Gastroenterology;;   SUBMUCOSAL LIFTING INJECTION  07/24/2022   Procedure: SUBMUCOSAL LIFTING INJECTION;  Surgeon: Eartha Flavors Toribio, MD;  [facility];  Service: Gastroenterology;;   TONSILLECTOMY  1970   Patient Active Problem List   Diagnosis Date Noted   Closed fracture of left fibula and tibia 02/02/2023   Acute diarrhea 12/23/2022   Functional dyspepsia 07/01/2022   DOE (dyspnea on exertion) 01/15/2022   Nausea with vomiting 05/28/2021   Duodenal adenoma 05/28/2021   Brain aneurysm 04/25/2021   History of herpes simplex infection 01/08/2021   Papanicolaou smear, as part of routine gynecological examination 01/08/2021   CVA (cerebral vascular accident) (HCC) 01/04/2021   Acute ischemic stroke (HCC) 01/03/2021   Hypokalemia 01/03/2021   Cerebral aneurysm 01/03/2021   Musculoskeletal pain 01/03/2021   Early satiety 11/27/2020   Abdominal discomfort 07/13/2020   Malignant neoplasm of upper-inner quadrant of left breast in  female, estrogen receptor positive (HCC) 04/16/2018   Essential hypertension 03/21/2015   Hyperlipidemia 03/21/2015   Genital herpes 03/21/2015   Fecal urgency 03/21/2015   GERD (gastroesophageal reflux disease) 03/21/2015    ONSET DATE: years  REFERRING DIAG: Parkinson's disease without dyskinesia or fluctuating manifestations   THERAPY DIAG:  Other abnormalities of gait and mobility  Muscle weakness (generalized)  Rationale for Evaluation and Treatment: Rehabilitation  SUBJECTIVE:                                                                                                                                                                                             SUBJECTIVE STATEMENT: Patient reports that she has been having trouble getting around for a while now. She  notes that it has been getting worse recently. She has not fallen since January 2025. She mainly uses a cane to help her get around. Her son has to help her some clean up around the house as she has trouble holding onto things.  Pt accompanied by: self  PERTINENT HISTORY: Osteoarthritis, Parkinson's disease, history of CVA, peripheral neuropathy, hypertension, and history of cancer  PAIN:  Are you having pain? No  PRECAUTIONS: None  RED FLAGS: None   WEIGHT BEARING RESTRICTIONS: No  FALLS: Has patient fallen in last 6 months? No  LIVING ENVIRONMENT: Lives with: lives with their son Lives in: House/apartment Stairs: Yes: External: 3-4 steps; on left going up Has following equipment at home: Single point cane and Walker - 2 wheeled  PLOF: Independent with basic ADLs  PATIENT GOALS: improved mobility  OBJECTIVE:  Note: Objective measures were completed at Evaluation unless otherwise noted.  COGNITION: Overall cognitive status: Within functional limits for tasks assessed  COORDINATION: Heel to shin test: difficulty with touching heel to the opposite shin; partial ROM Left hand: resting tremor  LOWER EXTREMITY MMT:    MMT Right Eval Left Eval  Hip flexion 4-/5 4-/5  Hip extension    Hip abduction    Hip adduction    Hip internal rotation    Hip external rotation    Knee flexion 4-/5 4/5  Knee extension 4-/5 4-/5  Ankle dorsiflexion 3+/5 3+/5  Ankle plantarflexion    Ankle inversion    Ankle eversion    (Blank rows = not tested)  GAIT: Findings: Gait Characteristics: step through pattern, decreased arm swing- Right, decreased arm swing- Left, decreased stride length, Right foot flat, Left foot flat, shuffling, poor foot clearance- Right, and poor foot clearance- Left, Distance walked: 50 feet, Assistive device utilized:Single point cane, and Level of assistance: SBA  FUNCTIONAL TESTS:  5 times sit to  stand: 26.98 seconds Timed up and go (TUG): 36.19 seconds  without AD  2 minute walk test: To be assessed at first follow-up, as able  PATIENT SURVEYS:  LEFS  Extreme difficulty/unable (0), Quite a bit of difficulty (1), Moderate difficulty (2), Little difficulty (3), No difficulty (4) Survey date:  01/29/24  Any of your usual work, housework or school activities 1  2. Usual hobbies, recreational or sporting activities 2  3. Getting into/out of the bath 3  4. Walking between rooms 4  5. Putting on socks/shoes 1  6. Squatting  2  7. Lifting an object, like a bag of groceries from the floor 2  8. Performing light activities around your home 4  9. Performing heavy activities around your home 0  10. Getting into/out of a car 2  11. Walking 2 blocks 0  12. Walking 1 mile 0  13. Going up/down 10 stairs (1 flight) 3  14. Standing for 1 hour 0  15.  sitting for 1 hour 4  16. Running on even ground 0  17. Running on uneven ground 0  18. Making sharp turns while running fast 0  19. Hopping  0  20. Rolling over in bed 2  Score total:  30/80                                                                                                                                 TREATMENT DATE:   01/29/24: PT evaluation, patient education, and HEP   PATIENT EDUCATION: Education details: HEP, plan of care, objective findings, and goals for physical therapy Person educated: Patient Education method: Explanation, Demonstration, and Handouts Education comprehension: verbalized understanding and returned demonstration  HOME EXERCISE PROGRAM: Access Code: [email or web address] URL: [email or web address] Date: 01/29/2024 Prepared by: Lacinda Fass  Exercises - Sit to Stand  - 1 x daily - 7 x weekly - 3 sets - 5 reps - Seated March  - 1 x daily - 7 x weekly - 3 sets - 10 reps - Seated Heel Toe Raises  - 1 x daily - 7 x weekly - 3 sets - 10 reps  GOALS: Goals reviewed with patient? Yes  LONG TERM GOALS: Target date: 02/26/24  Patient will be independent  with her HEP. Baseline:  Goal status: INITIAL  2.  Patient will improve her LEFS score to at least 40/80 for improved perceived function with her daily activities. Baseline:  Goal status: INITIAL  3.  Patient will improve her 5 times sit to stand time to 15 seconds or less to reduce her fall risk. Baseline:  Goal status: INITIAL  4.  Patient will improve her timed up and go time to 20 seconds or less for improved functional mobility. Baseline:  Goal status: INITIAL  ASSESSMENT:  CLINICAL IMPRESSION: Patient is a 74 y.o. female who was seen today for physical therapy evaluation and treatment for unsteadiness on her feet and gait deviations secondary to  Parkinson's disease.  She has a high fall risk as evidenced by her objective testing and gait mechanics.  She was provided a home exercise program which she was able to properly demonstrate.  Recommend that she continue with skilled physical therapy to address her impairments to maximize her safety and functional mobility.  OBJECTIVE IMPAIRMENTS: Abnormal gait, decreased activity tolerance, decreased balance, decreased coordination, decreased mobility, difficulty walking, and decreased strength.   ACTIVITY LIMITATIONS: carrying, lifting, standing, squatting, transfers, and locomotion level  PARTICIPATION LIMITATIONS: cleaning, shopping, and community activity  PERSONAL FACTORS: Past/current experiences, Time since onset of injury/illness/exacerbation, and 3+ comorbidities: Osteoarthritis, Parkinson's disease, history of CVA, peripheral neuropathy, hypertension, and history of cancer are also affecting patient's functional outcome.   REHAB POTENTIAL: Fair    CLINICAL DECISION MAKING: Evolving/moderate complexity  EVALUATION COMPLEXITY: Moderate  PLAN:  PT FREQUENCY: 2x/week  PT DURATION: 4 weeks  PLANNED INTERVENTIONS: 97110-Therapeutic exercises, 97530- Therapeutic activity, 97112- Neuromuscular re-education, 97535- Self Care,  97140- Manual therapy, and Patient/Family education  PLAN FOR NEXT SESSION: 2-minute walk test, gait training, lower extremity strengthening, and balance interventions   Lacinda JAYSON Fass, PT 01/29/2024, 6:29 PM   "

## 2024-02-03 ENCOUNTER — Ambulatory Visit (HOSPITAL_COMMUNITY)

## 2024-02-03 ENCOUNTER — Encounter (HOSPITAL_COMMUNITY): Payer: Self-pay | Admitting: Speech Pathology

## 2024-02-03 ENCOUNTER — Encounter (HOSPITAL_COMMUNITY): Payer: Self-pay

## 2024-02-03 ENCOUNTER — Ambulatory Visit (HOSPITAL_COMMUNITY): Admitting: Speech Pathology

## 2024-02-03 ENCOUNTER — Ambulatory Visit (HOSPITAL_COMMUNITY): Admitting: Occupational Therapy

## 2024-02-03 ENCOUNTER — Encounter (HOSPITAL_COMMUNITY): Payer: Self-pay | Admitting: Occupational Therapy

## 2024-02-03 DIAGNOSIS — R278 Other lack of coordination: Secondary | ICD-10-CM

## 2024-02-03 DIAGNOSIS — M6281 Muscle weakness (generalized): Secondary | ICD-10-CM

## 2024-02-03 DIAGNOSIS — R2689 Other abnormalities of gait and mobility: Secondary | ICD-10-CM

## 2024-02-03 DIAGNOSIS — R471 Dysarthria and anarthria: Secondary | ICD-10-CM

## 2024-02-03 DIAGNOSIS — R29818 Other symptoms and signs involving the nervous system: Secondary | ICD-10-CM

## 2024-02-03 NOTE — Therapy (Signed)
 " OUTPATIENT PHYSICAL THERAPY NEURO EVALUATION   Patient Name: Kimberly Mccormick MRN: [id number] DOB:[birth year], 74 y.o., female Today's Date: 02/03/2024   PCP: [name], MD  REFERRING PROVIDER: Whitfield Raisin, NP   END OF SESSION:  PT End of Session - 02/03/24 1507     Visit Number 2    Number of Visits 8    Date for Recertification  03/19/24    Authorization Type [employer]    Authorization Time Period no authorization required    PT Start Time 1513   Pt out at car at entrance, late entrance for PT session   PT Stop Time 1546    PT Time Calculation (min) 33 min    Equipment Utilized During Treatment Gait belt    Activity Tolerance Patient tolerated treatment well    Behavior During Therapy WFL for tasks assessed/performed          Past Medical History:  Diagnosis Date   Aneurysm of left internal carotid artery    Small supraclinoid 5 mm   Breast cancer (HCC)    Left s/p lumpectomy (05/06/2018) and adjuvant chemotherapy (06/23/2018) with Adriamycin and Cytoxan  x4 followed by Taxol weekly x12   DDD (degenerative disc disease), lumbar    Essential hypertension    Genital warts    GERD (gastroesophageal reflux disease)    History of anemia    History of renal insufficiency    Hypertension    Insomnia    Iron deficiency anemia    Ischemic stroke [facility])    December 2022   Mixed hyperlipidemia    Osteoarthritis    Parkinson's disease (HCC)    Peripheral neuropathy    Related to chemotherapy   Personal history of chemotherapy 2020   Port-A-Cath in place 06/23/2018   Past Surgical History:  Procedure Laterality Date   APPENDECTOMY  1966   BIOPSY  08/29/2020   Procedure: BIOPSY;  Surgeon: Eartha Angelia Sieving, MD;  [facility];  Service: Gastroenterology;;   BIOPSY  07/24/2022   Procedure: BIOPSY;  Surgeon: Eartha Angelia Sieving, MD;  [facility];  Service: Gastroenterology;;   BREAST BIOPSY Left 08/19/2023   US  LT BREAST BX W  LOC DEV 1ST LESION IMG BX SPEC US  GUIDE 08/19/2023 [facility]-UL[facility]OUND   BREAST LUMPECTOMY WITH RADIOACTIVE SEED AND SENTINEL LYMPH NODE BIOPSY Left 05/06/2018   Procedure: LEFT BREAST LUMPECTOMY WITH RADIOACTIVE SEED AND LEFT DEEP AXILLARY SENTINEL LYMPH NODE BIOPSY AND BLUE DYE INJECTION;  Surgeon: Gail Favorite, MD;  Location: [facility];  Service: General;  Laterality: Left;   CATARACT EXTRACTION W/PHACO Left 01/10/2014   Procedure: CATARACT EXTRACTION PHACO AND INTRAOCULAR LENS PLACEMENT ; CDE:  4.94;  Surgeon: Dow JULIANNA Burke, MD;  Location: [facility];  Service: Ophthalmology;  Laterality: Left;   CESAREAN SECTION  1986   CHOLECYSTECTOMY     COLONOSCOPY WITH PROPOFOL  N/A 07/25/2020   Procedure: COLONOSCOPY WITH PROPOFOL ;  Surgeon: Eartha Angelia Sieving, MD;  [facility];  Service: Gastroenterology;  Laterality: N/A;  10:55   ESOPHAGOGASTRODUODENOSCOPY N/A 12/30/2023   Procedure: EGD (ESOPHAGOGASTRODUODENOSCOPY);  Surgeon: Eartha Angelia, Sieving, MD;  [facility];  Service: Gastroenterology;  Laterality: N/A;  9:15 am, asa 3   ESOPHAGOGASTRODUODENOSCOPY (EGD) WITH PROPOFOL  N/A 08/29/2020   Procedure: ESOPHAGOGASTRODUODENOSCOPY (EGD) WITH PROPOFOL ;  Surgeon: Eartha Angelia Sieving, MD;  [facility];  Service: Gastroenterology;  Laterality: N/A;  12:00   ESOPHAGOGASTRODUODENOSCOPY (EGD) WITH PROPOFOL  N/A 07/24/2022   Procedure: ESOPHAGOGASTRODUODENOSCOPY (  EGD) WITH PROPOFOL ;  Surgeon: Eartha Flavors, Toribio, MD;  [facility];  Service: Gastroenterology;  Laterality: N/A;  12:45;ASA 1-2   HOT HEMOSTASIS  07/24/2022   Procedure: HOT HEMOSTASIS (ARGON PLASMA COAGULATION/BICAP);  Surgeon: Eartha Flavors, Toribio, MD;  [facility];  Service: Gastroenterology;;   IR 3D INDEPENDENT WKST  04/25/2021   IR ANGIO INTRA EXTRACRAN SEL COM CAROTID INNOMINATE UNI R MOD SED  04/25/2021   IR ANGIO INTRA EXTRACRAN SEL INTERNAL CAROTID UNI L MOD  SED  04/25/2021   IR ANGIOGRAM FOLLOW UP STUDY  04/25/2021   IR CT HEAD LTD  04/25/2021   IR NEURO EACH ADD'L AFTER BASIC UNI LEFT (MS)  04/25/2021   IR RADIOLOGIST EVAL & MGMT  03/22/2021   IR RADIOLOGIST EVAL & MGMT  03/28/2021   IR RADIOLOGIST EVAL & MGMT  05/15/2021   IR RADIOLOGIST EVAL & MGMT  09/28/2021   IR TRANSCATH/EMBOLIZ  04/25/2021   IR US  GUIDE VASC ACCESS RIGHT  04/25/2021   MYRINGOTOMY WITH TUBE PLACEMENT Right 02/04/2017   Procedure: REVISION OF RIGHT MYRINGOTOMY WITH TUBE PLACEMENT, WITH EXAM OF LEFT EAR;  Surgeon: Karis Clunes, MD;  Location: [facility];  Service: ENT;  Laterality: Right;   NASAL SINUS SURGERY  2016   polypectomy   OPEN REDUCTION INTERNAL FIXATION (ORIF) TIBIA/FIBULA FRACTURE Left 02/02/2023   Procedure: OPEN REDUCTION INTERNAL FIXATION (ORIF) LEFT TIBIA/FIBULA FRACTURE;  Surgeon: Georgina Ozell LABOR, MD;  Location: [facility];  Service: Orthopedics;  Laterality: Left;   POLYPECTOMY  07/25/2020   Procedure: POLYPECTOMY;  Surgeon: Eartha Flavors Toribio, MD;  [facility];  Service: Gastroenterology;;   POLYPECTOMY  08/29/2020   Procedure: POLYPECTOMY;  Surgeon: Eartha Flavors Toribio, MD;  [facility];  Service: Gastroenterology;;   POLYPECTOMY  07/24/2022   Procedure: POLYPECTOMY;  Surgeon: Eartha Flavors Toribio, MD;  [facility];  Service: Gastroenterology;;   [facility] REMOVAL N/A 01/06/2019   Procedure: REMOVAL PORT-A-CATH;  Surgeon: Gail Favorite, MD;  Location: [facility];  Service: General;  Laterality: N/A;   PORTACATH PLACEMENT Right 06/16/2018   Procedure: INSERTION PORT-A-CATH;  Surgeon: Gail Favorite, MD;  Location: [facility];  Service: General;  Laterality: Right;   RADIOLOGY WITH ANESTHESIA N/A 04/25/2021   Procedure: IR WITH ANESTHESIA EMBOLIZATION;  Surgeon: Dolphus Carrion, MD;  Location: [facility];  Service: Radiology;  Laterality: N/A;   SCLEROTHERAPY  07/24/2022   Procedure:  SCLEROTHERAPY;  Surgeon: Eartha Flavors, Toribio, MD;  [facility];  Service: Gastroenterology;;   SUBMUCOSAL LIFTING INJECTION  08/29/2020   Procedure: SUBMUCOSAL LIFTING INJECTION;  Surgeon: Eartha Flavors, Toribio, MD;  [facility];  Service: Gastroenterology;;   SUBMUCOSAL LIFTING INJECTION  07/24/2022   Procedure: SUBMUCOSAL LIFTING INJECTION;  Surgeon: Eartha Flavors Toribio, MD;  [facility];  Service: Gastroenterology;;   TONSILLECTOMY  1970   Patient Active Problem List   Diagnosis Date Noted   Closed fracture of left fibula and tibia 02/02/2023   Acute diarrhea 12/23/2022   Functional dyspepsia 07/01/2022   DOE (dyspnea on exertion) 01/15/2022   Nausea with vomiting 05/28/2021   Duodenal adenoma 05/28/2021   Brain aneurysm 04/25/2021   History of herpes simplex infection 01/08/2021   Papanicolaou smear, as part of routine gynecological examination 01/08/2021   CVA (cerebral vascular accident) (HCC) 01/04/2021   Acute ischemic stroke (HCC) 01/03/2021   Hypokalemia 01/03/2021   Cerebral aneurysm 01/03/2021   Musculoskeletal pain 01/03/2021  Early satiety 11/27/2020   Abdominal discomfort 07/13/2020   Malignant neoplasm of upper-inner quadrant of left breast in female, estrogen receptor positive (HCC) 04/16/2018   Essential hypertension 03/21/2015   Hyperlipidemia 03/21/2015   Genital herpes 03/21/2015   Fecal urgency 03/21/2015   GERD (gastroesophageal reflux disease) 03/21/2015    ONSET DATE: years  REFERRING DIAG: Parkinson's disease without dyskinesia or fluctuating manifestations   THERAPY DIAG:  Other abnormalities of gait and mobility  Muscle weakness (generalized)  Rationale for Evaluation and Treatment: Rehabilitation  SUBJECTIVE:                                                                                                                                                                                              SUBJECTIVE STATEMENT: 02/03/24:  Feeling good today, no reports of recent fall or pain.  Has began exercises at home, has been doing 2 times a day.   Eval:  Patient reports that she has been having trouble getting around for a while now. She notes that it has been getting worse recently. She has not fallen since January 2025. She mainly uses a cane to help her get around. Her son has to help her some clean up around the house as she has trouble holding onto things.  Pt accompanied by: self  PERTINENT HISTORY: Osteoarthritis, Parkinson's disease, history of CVA, peripheral neuropathy, hypertension, and history of cancer  PAIN:  Are you having pain? No  PRECAUTIONS: None  RED FLAGS: None   WEIGHT BEARING RESTRICTIONS: No  FALLS: Has patient fallen in last 6 months? No  LIVING ENVIRONMENT: Lives with: lives with their son Lives in: House/apartment Stairs: Yes: External: 3-4 steps; on left going up Has following equipment at home: Single point cane and Walker - 2 wheeled  PLOF: Independent with basic ADLs  PATIENT GOALS: improved mobility  OBJECTIVE:  Note: Objective measures were completed at Evaluation unless otherwise noted.  COGNITION: Overall cognitive status: Within functional limits for tasks assessed  COORDINATION: Heel to shin test: difficulty with touching heel to the opposite shin; partial ROM Left hand: resting tremor  LOWER EXTREMITY MMT:    MMT Right Eval Left Eval  Hip flexion 4-/5 4-/5  Hip extension    Hip abduction    Hip adduction    Hip internal rotation    Hip external rotation    Knee flexion 4-/5 4/5  Knee extension 4-/5 4-/5  Ankle dorsiflexion 3+/5 3+/5  Ankle plantarflexion    Ankle inversion    Ankle eversion    (Blank rows = not tested)  GAIT: Findings: Gait Characteristics: step through pattern, decreased  arm swing- Right, decreased arm swing- Left, decreased stride length, Right foot flat, Left foot flat, shuffling, poor foot  clearance- Right, and poor foot clearance- Left, Distance walked: 50 feet, Assistive device utilized:Single point cane, and Level of assistance: SBA  FUNCTIONAL TESTS:  5 times sit to stand: 26.98 seconds Timed up and go (TUG): 36.19 seconds without AD  2 minute walk test: 02/03/24:2MWT 254ft no AD, shuffled gait mechanics, no LOB  PATIENT SURVEYS:  LEFS  Extreme difficulty/unable (0), Quite a bit of difficulty (1), Moderate difficulty (2), Little difficulty (3), No difficulty (4) Survey date:  01/29/24  Any of your usual work, housework or school activities 1  2. Usual hobbies, recreational or sporting activities 2  3. Getting into/out of the bath 3  4. Walking between rooms 4  5. Putting on socks/shoes 1  6. Squatting  2  7. Lifting an object, like a bag of groceries from the floor 2  8. Performing light activities around your home 4  9. Performing heavy activities around your home 0  10. Getting into/out of a car 2  11. Walking 2 blocks 0  12. Walking 1 mile 0  13. Going up/down 10 stairs (1 flight) 3  14. Standing for 1 hour 0  15.  sitting for 1 hour 4  16. Running on even ground 0  17. Running on uneven ground 0  18. Making sharp turns while running fast 0  19. Hopping  0  20. Rolling over in bed 2  Score total:  30/80                                                                                                                                 TREATMENT DATE:   02/03/24: Reviewed goals 242ft no AD, shuffled gait mechanics, no LOB Standing:  PWR up with weighted red ball 10  PWR Reach 10x  PWR twist 10x  Marching 10x  Marching with opposite UE/LE 10x 3 cueing for sequence  Lateral toe tapping between 6in step alternating  Heel raise  Toe raise Nustep UE/LE L3 resistance x , SPM goal >65  Seated: Heel Toe Raises 10 LAQ 10  01/29/24: PT evaluation, patient education, and HEP   PATIENT EDUCATION: Education details: HEP, plan of care, objective findings,  and goals for physical therapy Person educated: Patient Education method: Explanation, Demonstration, and Handouts Education comprehension: verbalized understanding and returned demonstration  HOME EXERCISE PROGRAM: Access Code: [email or web address] URL: [email or web address] Date: 01/29/2024 Prepared by: Lacinda Fass  Exercises - Sit to Stand  - 1 x daily - 7 x weekly - 3 sets - 5 reps - Seated March  - 1 x daily - 7 x weekly - 3 sets - 10 reps - Seated Heel Toe Raises  - 1 x daily - 7 x weekly - 3 sets - 10 reps  GOALS: Goals reviewed with patient? Yes  LONG TERM GOALS: Target date: 02/26/24  Patient will be independent with her HEP. Baseline:  Goal status: INITIAL  2.  Patient will improve her LEFS score to at least 40/80 for improved perceived function with her daily activities. Baseline:  Goal status: INITIAL  3.  Patient will improve her 5 times sit to stand time to 15 seconds or less to reduce her fall risk. Baseline:  Goal status: INITIAL  4.  Patient will improve her timed up and go time to 20 seconds or less for improved functional mobility. Baseline:  Goal status: INITIAL  ASSESSMENT:  CLINICAL IMPRESSION: 02/03/24:  Reviewed goals and educated importance of HEP compliance for maximal benefits with therapy.  Pt able to recall and demonstrate current exercise program.  Began PWR activities for proximal strengthening and balance activities to improve reaching out BOS, min guard for safety with new activities.  Balance activities to improve SLS with intermittent HHA required.  Pt limited by fatigue with seated rest breaks required through session, seated exercises complete while resting.  No reports of increased pain through session.  Eval:  Patient is a 74 y.o. female who was seen today for physical therapy evaluation and treatment for unsteadiness on her feet and gait deviations secondary to Parkinson's disease.  She has a high fall risk as evidenced by her  objective testing and gait mechanics.  She was provided a home exercise program which she was able to properly demonstrate.  Recommend that she continue with skilled physical therapy to address her impairments to maximize her safety and functional mobility.  OBJECTIVE IMPAIRMENTS: Abnormal gait, decreased activity tolerance, decreased balance, decreased coordination, decreased mobility, difficulty walking, and decreased strength.   ACTIVITY LIMITATIONS: carrying, lifting, standing, squatting, transfers, and locomotion level  PARTICIPATION LIMITATIONS: cleaning, shopping, and community activity  PERSONAL FACTORS: Past/current experiences, Time since onset of injury/illness/exacerbation, and 3+ comorbidities: Osteoarthritis, Parkinson's disease, history of CVA, peripheral neuropathy, hypertension, and history of cancer are also affecting patient's functional outcome.   REHAB POTENTIAL: Fair    CLINICAL DECISION MAKING: Evolving/moderate complexity  EVALUATION COMPLEXITY: Moderate  PLAN:  PT FREQUENCY: 2x/week  PT DURATION: 4 weeks  PLANNED INTERVENTIONS: 97110-Therapeutic exercises, 97530- Therapeutic activity, 97112- Neuromuscular re-education, 97535- Self Care, [id number]- Manual therapy, and Patient/Family education  PLAN FOR NEXT SESSION: gait training, lower extremity strengthening, and balance interventions, PWR activities  Augustin Mclean, LPTA/CLT; CBIS [phone]  Mclean Augustin Amble, PTA 02/03/2024, 3:52 PM   "

## 2024-02-03 NOTE — Therapy (Signed)
 " OUTPATIENT OCCUPATIONAL THERAPY NEURO TREATMENT NOTE  Patient Name: RUTHA MELGOZA MRN: [id number] DOB:1950/11/20, 74 y.o., female Today's Date: 02/03/2024  PCP: Lari Standing, MD REFERRING PROVIDER: Whitfield Raisin, NP  END OF SESSION:  OT End of Session - 02/03/24 1435     Visit Number 3    Number of Visits 6    Date for Recertification  02/27/24    Authorization Type [employer] Dual Complete    OT Start Time 1354    OT Stop Time 1433    OT Time Calculation (min) 39 min    Activity Tolerance Patient tolerated treatment well    Behavior During Therapy WFL for tasks assessed/performed          Past Medical History:  Diagnosis Date   Aneurysm of left internal carotid artery    Small supraclinoid 5 mm   Breast cancer (HCC)    Left s/p lumpectomy (05/06/2018) and adjuvant chemotherapy (06/23/2018) with Adriamycin and Cytoxan  x4 followed by Taxol weekly x12   DDD (degenerative disc disease), lumbar    Essential hypertension    Genital warts    GERD (gastroesophageal reflux disease)    History of anemia    History of renal insufficiency    Hypertension    Insomnia    Iron deficiency anemia    Ischemic stroke [facility])    December 2022   Mixed hyperlipidemia    Osteoarthritis    Parkinson's disease (HCC)    Peripheral neuropathy    Related to chemotherapy   Personal history of chemotherapy 2020   Port-A-Cath in place 06/23/2018   Past Surgical History:  Procedure Laterality Date   APPENDECTOMY  1966   BIOPSY  08/29/2020   Procedure: BIOPSY;  Surgeon: Eartha Angelia Sieving, MD;  [facility];  Service: Gastroenterology;;   BIOPSY  07/24/2022   Procedure: BIOPSY;  Surgeon: Eartha Angelia Sieving, MD;  [facility];  Service: Gastroenterology;;   BREAST BIOPSY Left 08/19/2023   US  LT BREAST BX W LOC DEV 1ST LESION IMG BX SPEC US  GUIDE 08/19/2023 [facility]-ULTRASOUND   BREAST LUMPECTOMY WITH RADIOACTIVE SEED AND SENTINEL LYMPH NODE BIOPSY Left 05/06/2018    Procedure: LEFT BREAST LUMPECTOMY WITH RADIOACTIVE SEED AND LEFT DEEP AXILLARY SENTINEL LYMPH NODE BIOPSY AND BLUE DYE INJECTION;  Surgeon: Gail Favorite, MD;  Location: [facility];  Service: General;  Laterality: Left;   CATARACT EXTRACTION W/PHACO Left 01/10/2014   Procedure: CATARACT EXTRACTION PHACO AND INTRAOCULAR LENS PLACEMENT ; CDE:  4.94;  Surgeon: Dow JULIANNA Burke, MD;  Location: [facility];  Service: Ophthalmology;  Laterality: Left;   CESAREAN SECTION  1986   CHOLECYSTECTOMY     COLONOSCOPY WITH PROPOFOL  N/A 07/25/2020   Procedure: COLONOSCOPY WITH PROPOFOL ;  Surgeon: Eartha Angelia Sieving, MD;  [facility];  Service: Gastroenterology;  Laterality: N/A;  10:55   ESOPHAGOGASTRODUODENOSCOPY N/A 12/30/2023   Procedure: EGD (ESOPHAGOGASTRODUODENOSCOPY);  Surgeon: Eartha Angelia, Sieving, MD;  [facility];  Service: Gastroenterology;  Laterality: N/A;  9:15 am, asa 3   ESOPHAGOGASTRODUODENOSCOPY (EGD) WITH PROPOFOL  N/A 08/29/2020   Procedure: ESOPHAGOGASTRODUODENOSCOPY (EGD) WITH PROPOFOL ;  Surgeon: Eartha Angelia Sieving, MD;  [facility];  Service: Gastroenterology;  Laterality: N/A;  12:00   ESOPHAGOGASTRODUODENOSCOPY (EGD) WITH PROPOFOL  N/A 07/24/2022   Procedure: ESOPHAGOGASTRODUODENOSCOPY (EGD) WITH PROPOFOL ;  Surgeon: Eartha Angelia Sieving, MD;  [facility];  Service: Gastroenterology;  Laterality: N/A;  12:45;ASA 1-2   HOT HEMOSTASIS  07/24/2022   Procedure: HOT  HEMOSTASIS (ARGON PLASMA COAGULATION/BICAP);  Surgeon: Eartha Flavors, Toribio, MD;  [facility];  Service: Gastroenterology;;   IR 3D INDEPENDENT WKST  04/25/2021   IR ANGIO INTRA EXTRACRAN SEL COM CAROTID INNOMINATE UNI R MOD SED  04/25/2021   IR ANGIO INTRA EXTRACRAN SEL INTERNAL CAROTID UNI L MOD SED  04/25/2021   IR ANGIOGRAM FOLLOW UP STUDY  04/25/2021   IR CT HEAD LTD  04/25/2021   IR NEURO EACH ADD'L AFTER BASIC UNI LEFT (MS)  04/25/2021   IR  RADIOLOGIST EVAL & MGMT  03/22/2021   IR RADIOLOGIST EVAL & MGMT  03/28/2021   IR RADIOLOGIST EVAL & MGMT  05/15/2021   IR RADIOLOGIST EVAL & MGMT  09/28/2021   IR TRANSCATH/EMBOLIZ  04/25/2021   IR US  GUIDE VASC ACCESS RIGHT  04/25/2021   MYRINGOTOMY WITH TUBE PLACEMENT Right 02/04/2017   Procedure: REVISION OF RIGHT MYRINGOTOMY WITH TUBE PLACEMENT, WITH EXAM OF LEFT EAR;  Surgeon: Karis Clunes, MD;  Location: [facility];  Service: ENT;  Laterality: Right;   NASAL SINUS SURGERY  2016   polypectomy   OPEN REDUCTION INTERNAL FIXATION (ORIF) TIBIA/FIBULA FRACTURE Left 02/02/2023   Procedure: OPEN REDUCTION INTERNAL FIXATION (ORIF) LEFT TIBIA/FIBULA FRACTURE;  Surgeon: Georgina Ozell LABOR, MD;  Location: [facility];  Service: Orthopedics;  Laterality: Left;   POLYPECTOMY  07/25/2020   Procedure: POLYPECTOMY;  Surgeon: Eartha Flavors Toribio, MD;  [facility];  Service: Gastroenterology;;   POLYPECTOMY  08/29/2020   Procedure: POLYPECTOMY;  Surgeon: Eartha Flavors Toribio, MD;  [facility];  Service: Gastroenterology;;   POLYPECTOMY  07/24/2022   Procedure: POLYPECTOMY;  Surgeon: Eartha Flavors Toribio, MD;  [facility];  Service: Gastroenterology;;   [facility] REMOVAL N/A 01/06/2019   Procedure: REMOVAL PORT-A-CATH;  Surgeon: Gail Favorite, MD;  Location: [facility];  Service: General;  Laterality: N/A;   PORTACATH PLACEMENT Right 06/16/2018   Procedure: INSERTION PORT-A-CATH;  Surgeon: Gail Favorite, MD;  Location: [facility];  Service: General;  Laterality: Right;   RADIOLOGY WITH ANESTHESIA N/A 04/25/2021   Procedure: IR WITH ANESTHESIA EMBOLIZATION;  Surgeon: Dolphus Carrion, MD;  Location: [facility];  Service: Radiology;  Laterality: N/A;   SCLEROTHERAPY  07/24/2022   Procedure: SCLEROTHERAPY;  Surgeon: Eartha Flavors, Toribio, MD;  [facility];  Service: Gastroenterology;;   SUBMUCOSAL LIFTING INJECTION  08/29/2020    Procedure: SUBMUCOSAL LIFTING INJECTION;  Surgeon: Eartha Flavors, Toribio, MD;  [facility];  Service: Gastroenterology;;   SUBMUCOSAL LIFTING INJECTION  07/24/2022   Procedure: SUBMUCOSAL LIFTING INJECTION;  Surgeon: Eartha Flavors Toribio, MD;  [facility];  Service: Gastroenterology;;   TONSILLECTOMY  1970   Patient Active Problem List   Diagnosis Date Noted   Closed fracture of left fibula and tibia 02/02/2023   Acute diarrhea 12/23/2022   Functional dyspepsia 07/01/2022   DOE (dyspnea on exertion) 01/15/2022   Nausea with vomiting 05/28/2021   Duodenal adenoma 05/28/2021   Brain aneurysm 04/25/2021   History of herpes simplex infection 01/08/2021   Papanicolaou smear, as part of routine gynecological examination 01/08/2021   CVA (cerebral vascular accident) (HCC) 01/04/2021   Acute ischemic stroke (HCC) 01/03/2021   Hypokalemia 01/03/2021   Cerebral aneurysm 01/03/2021   Musculoskeletal pain 01/03/2021   Early satiety 11/27/2020   Abdominal discomfort 07/13/2020   Malignant neoplasm of upper-inner quadrant of left breast in female, estrogen receptor positive (HCC) 04/16/2018   Essential hypertension 03/21/2015  Hyperlipidemia 03/21/2015   Genital herpes 03/21/2015   Fecal urgency 03/21/2015   GERD (gastroesophageal reflux disease) 03/21/2015    ONSET DATE: ~3 years  REFERRING DIAG: Parkinson's Disease  THERAPY DIAG:  Other lack of coordination  Other symptoms and signs involving the nervous system  Rationale for Evaluation and Treatment: Rehabilitation  SUBJECTIVE:   SUBJECTIVE STATEMENT: I'm having more good days than bad. Pt accompanied by: self  PERTINENT HISTORY: 74 y.o. female who is being followed for left-sided predominant Parkinson's disease. Initially seen by Dr. Buck 11/2019 for several year history of intermittent left hand tremors and exam showed evidence of mild left-sided parkinsonism. DaTscan  02/2020 supportive of  diagnosis. Hx of right corona radiata infarct 12/2020 with incidental finding of cerebral aneurysm on workup s/p endovascular treatment with flow diverter device 04/2021. Pt had OT in 12/2022 however then broke her leg and had to discharge and focus on PT. Pt now returning with worsening parkinson's symptons  PRECAUTIONS: None  WEIGHT BEARING RESTRICTIONS: No  PAIN:  Are you having pain? Yes: NPRS scale: 5/10 Pain location: back Pain description: aching Aggravating factors: standing Relieving factors: resting  FALLS: Has patient fallen in last 6 months? No  LIVING ENVIRONMENT: Lives with: lives with their family and lives with their son Lives in: House/apartment  PLOF: Independent  PATIENT GOALS: To improve control of LUE  OBJECTIVE:  Note: Objective measures were completed at Evaluation unless otherwise noted.  HAND DOMINANCE: Right  ADLs: Overall ADLs: Pt having difficulty with fine motor skills, unable to manipulate buttons, zippers, or clasps, as well as difficulty with bottles and cans requiring assist with most all ADL's and all IADL's.   MOBILITY STATUS: difficulty carrying objections with ambulation  POSTURE COMMENTS:  No Significant postural limitations Sitting balance: Moves/returns truncal midpoint >2 inches in all planes  ACTIVITY TOLERANCE: Activity tolerance: Pt fatigues quickly  UPPER EXTREMITY ROM:    BUE Full ROM  UPPER EXTREMITY MMT:     MMT Right eval Left eval  Shoulder flexion 4+/5 4+/5  Shoulder abduction 4/5 4+/5  Shoulder internal rotation 4+/5 4+/5  Shoulder external rotation 4/5 4/5  Elbow flexion 5/5 5/5  Elbow extension 5/5 5/5  Wrist flexion 5/5 4+/5  Wrist extension 5/5 4+/5  Wrist ulnar deviation 5/5 4+/5  Wrist radial deviation 5/5 5/5  Wrist pronation 5/5 4/5  Wrist supination 5/5 4+/5  (Blank rows = not tested)  HAND FUNCTION: Grip strength: Right: 54 lbs; Left: 46 lbs, Lateral pinch: Right: 13 lbs, Left: 12 lbs, and 3  point pinch: Right: 12 lbs, Left: 10 lbs  COORDINATION: 9 Hole Peg test: Right: 32.25 sec; Left: 26.21 sec  SENSATION: WFL  EDEMA: No swelling noted  COGNITION: Overall cognitive status: No family/caregiver present to determine baseline cognitive functioning  OBSERVATIONS: Mild constant tremor noted  TREATMENT DATE:   02/03/24 -Boom whackers: 1 in each hand, reaching out of base of support to hit OT's boom whackers consistently, quickly, and in all different directions and heights -Scarves:  -pulling scarves out of cone 1 at a time and throwing over her shoulder  -tossing scarves up and catching them  -Theraputty: -using a built up fork and knife, cutting up a large chunk of putty to resemble steak -Roll putty into a ball, flatten into a pancake, roll into a log, tripod pinch, lateral pinch, roll into ball, squeeze -Hand writing: signing name, printing name, filling out envelope -UBE: level 2, 2.5' forwards and backwards  01/27/24 -Strengthening: 2#, shoulder flexion, shoulder abduction, protraction, horizontal abduction, er/IR, hammer curls, bicep curls, x15 -Wrist Strengthening: 2#, flexion, extension, ulnar/radial deviation, supination/pronation, x15 -Gripper: BUE 35# medium beads, 37# medium beads (pain in L thumb at this weight) -Grooved Peg Board: 12 pegs BUE -writing with a normal pen and a built up handle  12/31/23 -Provided information on the Cala watch for tremor relief -reviewed ADL's with extensive problems     PATIENT EDUCATION: Education details: Shoulder and It Sales Professional Person educated: Patient Education method: Explanation, Demonstration, and Handouts Education comprehension: verbalized understanding and returned demonstration  HOME EXERCISE PROGRAM: 12/10: Calla Watch 1/6: Shoulder and wrist  strengthening   GOALS: Goals reviewed with patient? Yes  SHORT TERM GOALS: Target date: 02/27/24  Pt will be provided and educated on HEP for BUE in order to improve independence with all ADL's and IADL's.   Goal status: INITIAL  2.  Pt will increase BUE strength to 5/5 in order to lift and carry items needed for cleaning tasks.   Goal status: INITIAL  3.  Pt will increase BUE grip strength by 10# and pinch strength by 2# in order to grasp and hold items needed during meal prep.   Goal status: INITIAL  4.  Pt will increase BUE coordination by completing 9 hole peg test in 32 or less in order to manipulate buttons, zippers, and clasps.  Baseline:  Goal status: INITIAL  ASSESSMENT:  CLINICAL IMPRESSION: Pt is improving her overall speed and efficiency of movements. She is demonstrating good range and strength this session, along with decreased tremors noted, especially once a 1# wrist weight was added after working with the boom whackers. OT added multiple ADL tasks to address difficulties and work on strengthening and improving coordination with AE as needed, such as a built up handle added to the knife and fork. Verbal and tactile cuing provided for positioning and technique throughout session.   PERFORMANCE DEFICITS: in functional skills including ADLs, IADLs, coordination, dexterity, tone, ROM, strength, pain, fascial restrictions, Fine motor control, Gross motor control, body mechanics, and UE functional use.   PLAN:  OT FREQUENCY: 1x/week  OT DURATION: 6 weeks  PLANNED INTERVENTIONS: 97168 OT Re-evaluation, 97535 self care/ADL training, 02889 therapeutic exercise, 97530 therapeutic activity, 97112 neuromuscular re-education, 97140 manual therapy, 97035 ultrasound, 97018 paraffin, 02989 moist heat, 97032 electrical stimulation (manual), passive range of motion, functional mobility training, energy conservation, coping strategies training, patient/family education, and DME  and/or AE instructions  RECOMMENDED OTHER SERVICES: PT  CONSULTED AND AGREED WITH PLAN OF CARE: Patient  PLAN FOR NEXT SESSION: Stability tasks, strengthening, grip and pinch strengthening  Valentin Nightingale, OTR/L [facility] [phone] Mieshia Pepitone Jillyn Nightingale, OT 02/03/2024, 2:36 PM    "

## 2024-02-03 NOTE — Therapy (Signed)
 " OUTPATIENT SPEECH LANGUAGE PATHOLOGY [facility]   Patient Name: Kimberly Mccormick MRN: [id number] DOB:February 04, 1950, 74 y.o., female Today's Date: 02/03/2024  PCP: [name], MD REFERRING PROVIDER: Whitfield Raisin, NP  END OF SESSION:  End of Session - 02/03/24 1551     Visit Number 2    Number of Visits 7    Date for Recertification  02/26/24    Authorization Type [employer] Dual Complete    SLP Start Time 1545    SLP Stop Time  1630    SLP Time Calculation (min) 45 min    Activity Tolerance Patient tolerated treatment well          Past Medical History:  Diagnosis Date   Aneurysm of left internal carotid artery    Small supraclinoid 5 mm   Breast cancer (HCC)    Left s/p lumpectomy (05/06/2018) and adjuvant chemotherapy (06/23/2018) with Adriamycin and Cytoxan  x4 followed by Taxol weekly x12   DDD (degenerative disc disease), lumbar    Essential hypertension    Genital warts    GERD (gastroesophageal reflux disease)    History of anemia    History of renal insufficiency    Hypertension    Insomnia    Iron deficiency anemia    Ischemic stroke [facility])    December 2022   Mixed hyperlipidemia    Osteoarthritis    Parkinson's disease (HCC)    Peripheral neuropathy    Related to chemotherapy   Personal history of chemotherapy 2020   Port-A-Cath in place 06/23/2018   Past Surgical History:  Procedure Laterality Date   APPENDECTOMY  1966   BIOPSY  08/29/2020   Procedure: BIOPSY;  Surgeon: Eartha Angelia Sieving, MD;  [facility];  Service: Gastroenterology;;   BIOPSY  07/24/2022   Procedure: BIOPSY;  Surgeon: Eartha Angelia Sieving, MD;  [facility];  Service: Gastroenterology;;   BREAST BIOPSY Left 08/19/2023   US  LT BREAST BX W LOC DEV 1ST LESION IMG BX SPEC US  GUIDE 08/19/2023 [facility]-ULTRASOUND   BREAST LUMPECTOMY WITH RADIOACTIVE SEED AND SENTINEL LYMPH NODE BIOPSY Left 05/06/2018   Procedure: LEFT BREAST LUMPECTOMY WITH  RADIOACTIVE SEED AND LEFT DEEP AXILLARY SENTINEL LYMPH NODE BIOPSY AND BLUE DYE INJECTION;  Surgeon: Gail Favorite, MD;  Location: [facility];  Service: General;  Laterality: Left;   CATARACT EXTRACTION W/PHACO Left 01/10/2014   Procedure: CATARACT EXTRACTION PHACO AND INTRAOCULAR LENS PLACEMENT ; CDE:  4.94;  Surgeon: Dow JULIANNA Burke, MD;  Location: [facility];  Service: Ophthalmology;  Laterality: Left;   CESAREAN SECTION  1986   CHOLECYSTECTOMY     COLONOSCOPY WITH PROPOFOL  N/A 07/25/2020   Procedure: COLONOSCOPY WITH PROPOFOL ;  Surgeon: Eartha Angelia Sieving, MD;  [facility];  Service: Gastroenterology;  Laterality: N/A;  10:55   ESOPHAGOGASTRODUODENOSCOPY N/A 12/30/2023   Procedure: EGD (ESOPHAGOGASTRODUODENOSCOPY);  Surgeon: Eartha Angelia, Sieving, MD;  [facility];  Service: Gastroenterology;  Laterality: N/A;  9:15 am, asa 3   ESOPHAGOGASTRODUODENOSCOPY (EGD) WITH PROPOFOL  N/A 08/29/2020   Procedure: ESOPHAGOGASTRODUODENOSCOPY (EGD) WITH PROPOFOL ;  Surgeon: Eartha Angelia Sieving, MD;  [facility];  Service: Gastroenterology;  Laterality: N/A;  12:00   ESOPHAGOGASTRODUODENOSCOPY (EGD) WITH PROPOFOL  N/A 07/24/2022   Procedure: ESOPHAGOGASTRODUODENOSCOPY (EGD) WITH PROPOFOL ;  Surgeon: Eartha Angelia Sieving, MD;  [facility];  Service: Gastroenterology;  Laterality: N/A;  12:45;ASA 1-2   HOT HEMOSTASIS  07/24/2022   Procedure: HOT HEMOSTASIS (ARGON PLASMA COAGULATION/BICAP);  Surgeon: Eartha  Angelia Sieving, MD;  [facility];  Service: Gastroenterology;;   IR 3D INDEPENDENT WKST  04/25/2021   IR ANGIO INTRA EXTRACRAN SEL COM CAROTID INNOMINATE UNI R MOD SED  04/25/2021   IR ANGIO INTRA EXTRACRAN SEL INTERNAL CAROTID UNI L MOD SED  04/25/2021   IR ANGIOGRAM FOLLOW UP STUDY  04/25/2021   IR CT HEAD LTD  04/25/2021   IR NEURO EACH ADD'L AFTER BASIC UNI LEFT (MS)  04/25/2021   IR RADIOLOGIST EVAL & MGMT  03/22/2021   IR  RADIOLOGIST EVAL & MGMT  03/28/2021   IR RADIOLOGIST EVAL & MGMT  05/15/2021   IR RADIOLOGIST EVAL & MGMT  09/28/2021   IR TRANSCATH/EMBOLIZ  04/25/2021   IR US  GUIDE VASC ACCESS RIGHT  04/25/2021   MYRINGOTOMY WITH TUBE PLACEMENT Right 02/04/2017   Procedure: REVISION OF RIGHT MYRINGOTOMY WITH TUBE PLACEMENT, WITH EXAM OF LEFT EAR;  Surgeon: Karis Clunes, MD;  Location: [facility];  Service: ENT;  Laterality: Right;   NASAL SINUS SURGERY  2016   polypectomy   OPEN REDUCTION INTERNAL FIXATION (ORIF) TIBIA/FIBULA FRACTURE Left 02/02/2023   Procedure: OPEN REDUCTION INTERNAL FIXATION (ORIF) LEFT TIBIA/FIBULA FRACTURE;  Surgeon: Georgina Ozell LABOR, MD;  Location: [facility];  Service: Orthopedics;  Laterality: Left;   POLYPECTOMY  07/25/2020   Procedure: POLYPECTOMY;  Surgeon: Eartha Angelia Sieving, MD;  [facility];  Service: Gastroenterology;;   POLYPECTOMY  08/29/2020   Procedure: POLYPECTOMY;  Surgeon: Eartha Angelia Sieving, MD;  [facility];  Service: Gastroenterology;;   POLYPECTOMY  07/24/2022   Procedure: POLYPECTOMY;  Surgeon: Eartha Angelia Sieving, MD;  [facility];  Service: Gastroenterology;;   [facility] REMOVAL N/A 01/06/2019   Procedure: REMOVAL PORT-A-CATH;  Surgeon: Gail Favorite, MD;  Location: [facility];  Service: General;  Laterality: N/A;   PORTACATH PLACEMENT Right 06/16/2018   Procedure: INSERTION PORT-A-CATH;  Surgeon: Gail Favorite, MD;  Location: [facility];  Service: General;  Laterality: Right;   RADIOLOGY WITH ANESTHESIA N/A 04/25/2021   Procedure: IR WITH ANESTHESIA EMBOLIZATION;  Surgeon: Dolphus Carrion, MD;  Location[facility];  Service: Radiology;  Laterality: N/A;   SCLEROTHERAPY  07/24/2022   Procedure: SCLEROTHERAPY;  Surgeon: Eartha Angelia, Sieving, MD;  [facility];  Service: Gastroenterology;;   SUBMUCOSAL LIFTING INJECTION  08/29/2020   Procedure: SUBMUCOSAL LIFTING  INJECTION;  Surgeon: Eartha Angelia, Sieving, MD;  [facility];  Service: Gastroenterology;;   SUBMUCOSAL LIFTING INJECTION  07/24/2022   Procedure: SUBMUCOSAL LIFTING INJECTION;  Surgeon: Eartha Angelia Sieving, MD;  [facility];  Service: Gastroenterology;;   TONSILLECTOMY  1970   Patient Active Problem List   Diagnosis Date Noted   Closed fracture of left fibula and tibia 02/02/2023   Acute diarrhea 12/23/2022   Functional dyspepsia 07/01/2022   DOE (dyspnea on exertion) 01/15/2022   Nausea with vomiting 05/28/2021   Duodenal adenoma 05/28/2021   Brain aneurysm 04/25/2021   History of herpes simplex infection 01/08/2021   Papanicolaou smear, as part of routine gynecological examination 01/08/2021   CVA (cerebral vascular accident) (HCC) 01/04/2021   Acute ischemic stroke (HCC) 01/03/2021   Hypokalemia 01/03/2021   Cerebral aneurysm 01/03/2021   Musculoskeletal pain 01/03/2021   Early satiety 11/27/2020   Abdominal discomfort 07/13/2020   Malignant neoplasm of upper-inner quadrant of left breast in female, estrogen receptor positive (HCC) 04/16/2018   Essential hypertension 03/21/2015   Hyperlipidemia 03/21/2015   Genital herpes  03/21/2015   Fecal urgency 03/21/2015   GERD (gastroesophageal reflux disease) 03/21/2015    ONSET DATE: ~2021  REFERRING DIAG: G20.A1 (ICD-10-CM) - Parkinson's disease without dyskinesia or fluctuating manifestations (HCC)  THERAPY DIAG:  Dysarthria  Rationale for Evaluation and Treatment: Rehabilitation  SUBJECTIVE:   SUBJECTIVE STATEMENT: I try to do it at least once a day. (Speakout!)  Pt accompanied by: self  PERTINENT HISTORY: ORISSA ARREAGA is a 74 y.o. female who is referred by Harlene Bogaert, NP (neurology) for SLP evaluation and treatment in setting of left-sided predominant Parkinson's disease.  Initially seen by Dr. Buck 11/2019 for several year history of intermittent left hand tremors and exam showed  evidence of mild left-sided parkinsonism.  DaTscan  02/2020 supportive of diagnosis. Hx of right corona radiata infarct 12/2020 with incidental finding of cerebral aneurysm on workup s/p endovascular treatment with flow diverter device 04/2021 by Dr. Dolphus. She was evaluated and seen for one treatment session for SLP a year ago, but had to cancel her appointments due to a fall on ice.  PAIN:  Are you having pain? No  FALLS: Has patient fallen in last 6 months?  No  LIVING ENVIRONMENT: Lives with: lives with their family and lives with their son Lives in: House/apartment  PLOF:  Level of assistance: Independent with ADLs, Independent with IADLs Employment: Retired  PATIENT GOALS: Increase loudness for speech  OBJECTIVE:  Note: Objective measures were completed at Evaluation unless otherwise noted.  DIAGNOSTIC FINDINGS:  04/15/2022 MRI HEAD FINDINGS IMPRESSION: 1. No acute brain finding. Moderate chronic small-vessel ischemic changes of the cerebral hemispheric white matter. 2. Left internal carotid artery flow diverting stent. No residual visualized flow in the region of the medially directed aneurysm. 3. 4 mm aneurysm projecting laterally from the petrous carotid on the left. No change.   02/23/2020 NUCLEAR MEDICINE BRAIN IMAGING WITH SPECT (DaTscan  ) IMPRESSION: Significant decreased striatal Ioflupane activity most prominent in the posterior striatum. This pattern can be seen in Parkinsonian syndromes.  COGNITION: Overall cognitive status: Impaired: Areas of impairment:  Memory: Impaired: Working Teacher, Music term  Areas of impairment: Memory Comments: 20/30 on the [facility] SLUMS  MOTOR SPEECH: Overall motor speech: impaired Level of impairment: Conversation Respiration: diaphragmatic/abdominal breathing Phonation: normal and mild glottal fry Resonance: WFL Articulation: Appears intact Intelligibility: Intelligible Motor planning: Appears intact Motor speech errors:  N/A Interfering components: fluctuation throughout the day likely Effective technique: increased vocal intensity  ORAL MOTOR EXAMINATION: Overall status: WFL Comments: N/A and Pt reports no current dysphagia symptoms  OBJECTIVE VOICE ASSESSMENT: Sustained ah maximum phonation time: 17.7 seconds Sustained ah loudness average: 92 dB Oral reading (passage) loudness average: 75 dB Conversational loudness average: 70 dB Voice quality: occasional glottal fry observed Stimulability trials: Given SLP modeling and occasional min cues, loudness average increased to 75dB at conversation level.  Comments: Pt automatically increased vocal intensity during recorded and microphone displayed tasks  Completed audio recording of patients baseline voice without cueing from SLP: Yes  Pt does not report difficulty with swallowing which does not warrant further evaluation.  TREATMENT DATE: 02/03/2024 Pt brought her SPEAKOUT! Therapy workbook today. SLP provided modeling of each exercise and asked Pt to return demonstrate to ensure accuracy. She benefited from mi/mod cues for smooth and continuous to assist with breath support. Pt with occasional glottal fry observed on /a/ glides downward and cued to stop if she hears it. She demonstrated good intent in all structured tasks, but was noted to decrease slightly in spontaneous conversation. SLP provided min prompts when she shared about her upcoming birthday celebration to increase intent with good results. She also sang what she plans to sing in her church this Sunday and appeared pleased when SLP shared that I could understand every word. SLP showed her the first few minutes of the What is Parkinson's video and texted her the link to review at home (she does not have email). Continue with Speakout! Workbook 2x/day.   PATIENT EDUCATION: Education  details: Pt to bring her SPEAKOUT! Therapy notebook next session and encouraged to speak with intent. Person educated: Patient Education method: Medical Illustrator Education comprehension: verbalized understanding  HOME EXERCISE PROGRAM: Pt will completed HEP as assigned to facilitate carryover of treatment strategies and techniques in home and community environment with written cues.  GOALS: Goals reviewed with patient? Yes   SHORT TERM GOALS: Target date: 02/26/2024   Pt will coordinate vocal and articulatory subsystems in hierarchical speech tasks by producing sounds with intention with min assistance. Baseline: introduced this date, min/mod Goal status: ONGOING   2.  Generalize intentional speech to cognitive-linguistic exercises and conversational speech with improved vocal quality, loudness, articulatory precision, and endurance while maintaining a minimum of 85 dB with min assistance. Baseline: 68 dB Goal status: ONGOING   3.  Read phrases, sentences, and paragraphs with intention, yielding improved vocal quality, loudness, articulatory precision, and endurance while maintaining a minimum of 85 dB with min assistance. Baseline: 75 dB Goal status: ONGOING   LONG TERM GOALS: Target date: 02/2024   The patient will increase intensity in conversational speech allowing them to converse in the community, increase voice use, slow progression of vocal deterioration, and improve their QOL with indirect cues prn. Baseline: min/mod cues Goal status: ONGOING  ASSESSMENT:  CLINICAL IMPRESSION: Patient is a 74 y.o. female who was seen today for a cognitive linguistic evaluation in setting of Parkinson's disease. Pt presents with mild cognitive linguistic deficits ([facility] SLUMS 20/30) and mild hypokinetic dysarthria characterized by attention and working memory deficits, hoarse/strained vocal quality with glottal fry and fluctuations in loudness. Pt reports feeling as if she is  not talking loud enough and has to make an effort to speak louder. While she sustained /a/ for 18 seconds at a level of 92 dB, her volume in conversation was sub optimal at ~68 dB. Pt counted from 1-10 with average of 68 dB and when cued for increased vocal intensity and intent, this improved to 82 dB. Pt is motivated to improve her speech and showed improvement when cues provided by SLP. Recommend SPEAKOUT! Therapy for 6+ sessions and plan for discharge to a group setting to continue and maintain gains. Pt is in agreement with plan of care.  OBJECTIVE IMPAIRMENTS: Objective impairments include memory and dysarthria. These impairments are limiting patient from effectively communicating at home and in community.Factors affecting potential to achieve goals and functional outcome are previous level of function.. Patient will benefit from skilled SLP services to address above impairments and improve overall function.  REHAB POTENTIAL: Excellent  PLAN:  SLP FREQUENCY: 1-2x/week  SLP DURATION: 4 weeks  PLANNED INTERVENTIONS: Cueing hierachy, Internal/external aids, Multimodal communication approach, SLP instruction and feedback, Compensatory strategies, Patient/family education, [phone] Treatment of speech (30 or 45 min) , and [id number]- Speech [address] Artic, Phon, Eval Compre, Express   Thank you,  Lamar Candy, CCC-SLP [phone]  Jjesus Dingley, CCC-SLP 02/03/2024, 3:51 PM      "

## 2024-02-05 ENCOUNTER — Encounter (HOSPITAL_COMMUNITY): Payer: Self-pay | Admitting: Speech Pathology

## 2024-02-05 ENCOUNTER — Ambulatory Visit (HOSPITAL_COMMUNITY): Admitting: Speech Pathology

## 2024-02-05 DIAGNOSIS — R471 Dysarthria and anarthria: Secondary | ICD-10-CM

## 2024-02-05 NOTE — Therapy (Signed)
 " OUTPATIENT SPEECH LANGUAGE PATHOLOGY PARKINSON'S TREATMENT   Patient Name: Kimberly Mccormick MRN: [id number] DOB:11/26/1950, 74 y.o., female Today's Date: 02/05/2024  PCP: [name], MD REFERRING PROVIDER: Whitfield Raisin, NP  END OF SESSION:  End of Session - 02/05/24 1221     Visit Number 3    Number of Visits 7    Date for Recertification  02/26/24    Authorization Type [employer] Dual Complete    SLP Start Time 1205    SLP Stop Time  1250    SLP Time Calculation (min) 45 min    Activity Tolerance Patient tolerated treatment well          Past Medical History:  Diagnosis Date   Aneurysm of left internal carotid artery    Small supraclinoid 5 mm   Breast cancer (HCC)    Left s/p lumpectomy (05/06/2018) and adjuvant chemotherapy (06/23/2018) with Adriamycin and Cytoxan  x4 followed by Taxol weekly x12   DDD (degenerative disc disease), lumbar    Essential hypertension    Genital warts    GERD (gastroesophageal reflux disease)    History of anemia    History of renal insufficiency    Hypertension    Insomnia    Iron deficiency anemia    Ischemic stroke [facility])    December 2022   Mixed hyperlipidemia    Osteoarthritis    Parkinson's disease (HCC)    Peripheral neuropathy    Related to chemotherapy   Personal history of chemotherapy 2020   Port-A-Cath in place 06/23/2018   Past Surgical History:  Procedure Laterality Date   APPENDECTOMY  1966   BIOPSY  08/29/2020   Procedure: BIOPSY;  Surgeon: Eartha Angelia Sieving, MD;  [facility];  Service: Gastroenterology;;   BIOPSY  07/24/2022   Procedure: BIOPSY;  Surgeon: Eartha Angelia Sieving, MD;  [facility];  Service: Gastroenterology;;   BREAST BIOPSY Left 08/19/2023   US  LT BREAST BX W LOC DEV 1ST LESION IMG BX SPEC US  GUIDE 08/19/2023 [facility]-ULTRASOUND   BREAST LUMPECTOMY WITH RADIOACTIVE SEED AND SENTINEL LYMPH NODE BIOPSY Left 05/06/2018   Procedure: LEFT BREAST LUMPECTOMY WITH  RADIOACTIVE SEED AND LEFT DEEP AXILLARY SENTINEL LYMPH NODE BIOPSY AND BLUE DYE INJECTION;  Surgeon: Gail Favorite, MD;  Location: [facility];  Service: General;  Laterality: Left;   CATARACT EXTRACTION W/PHACO Left 01/10/2014   Procedure: CATARACT EXTRACTION PHACO AND INTRAOCULAR LENS PLACEMENT ; CDE:  4.94;  Surgeon: Dow JULIANNA Burke, MD;  Location: [facility];  Service: Ophthalmology;  Laterality: Left;   CESAREAN SECTION  1986   CHOLECYSTECTOMY     COLONOSCOPY WITH PROPOFOL  N/A 07/25/2020   Procedure: COLONOSCOPY WITH PROPOFOL ;  Surgeon: Eartha Angelia Sieving, MD;  [facility];  Service: Gastroenterology;  Laterality: N/A;  10:55   ESOPHAGOGASTRODUODENOSCOPY N/A 12/30/2023   Procedure: EGD (ESOPHAGOGASTRODUODENOSCOPY);  Surgeon: Eartha Angelia, Sieving, MD;  [facility];  Service: Gastroenterology;  Laterality: N/A;  9:15 am, asa 3   ESOPHAGOGASTRODUODENOSCOPY (EGD) WITH PROPOFOL  N/A 08/29/2020   Procedure: ESOPHAGOGASTRODUODENOSCOPY (EGD) WITH PROPOFOL ;  Surgeon: Eartha Angelia Sieving, MD;  [facility];  Service: Gastroenterology;  Laterality: N/A;  12:00   ESOPHAGOGASTRODUODENOSCOPY (EGD) WITH PROPOFOL  N/A 07/24/2022   Procedure: ESOPHAGOGASTRODUODENOSCOPY (EGD) WITH PROPOFOL ;  Surgeon: Eartha Angelia Sieving, MD;  [facility];  Service: Gastroenterology;  Laterality: N/A;  12:45;ASA 1-2   HOT HEMOSTASIS  07/24/2022   Procedure: HOT HEMOSTASIS (ARGON PLASMA COAGULATION/BICAP);  Surgeon: Eartha  Angelia Sieving, MD;  [facility];  Service: Gastroenterology;;   IR 3D INDEPENDENT WKST  04/25/2021   IR ANGIO INTRA EXTRACRAN SEL COM CAROTID INNOMINATE UNI R MOD SED  04/25/2021   IR ANGIO INTRA EXTRACRAN SEL INTERNAL CAROTID UNI L MOD SED  04/25/2021   IR ANGIOGRAM FOLLOW UP STUDY  04/25/2021   IR CT HEAD LTD  04/25/2021   IR NEURO EACH ADD'L AFTER BASIC UNI LEFT (MS)  04/25/2021   IR RADIOLOGIST EVAL & MGMT  03/22/2021   IR  RADIOLOGIST EVAL & MGMT  03/28/2021   IR RADIOLOGIST EVAL & MGMT  05/15/2021   IR RADIOLOGIST EVAL & MGMT  09/28/2021   IR TRANSCATH/EMBOLIZ  04/25/2021   IR US  GUIDE VASC ACCESS RIGHT  04/25/2021   MYRINGOTOMY WITH TUBE PLACEMENT Right 02/04/2017   Procedure: REVISION OF RIGHT MYRINGOTOMY WITH TUBE PLACEMENT, WITH EXAM OF LEFT EAR;  Surgeon: Karis Clunes, MD;  Location: [facility];  Service: ENT;  Laterality: Right;   NASAL SINUS SURGERY  2016   polypectomy   OPEN REDUCTION INTERNAL FIXATION (ORIF) TIBIA/FIBULA FRACTURE Left 02/02/2023   Procedure: OPEN REDUCTION INTERNAL FIXATION (ORIF) LEFT TIBIA/FIBULA FRACTURE;  Surgeon: Georgina Ozell LABOR, MD;  Location: [facility];  Service: Orthopedics;  Laterality: Left;   POLYPECTOMY  07/25/2020   Procedure: POLYPECTOMY;  Surgeon: Eartha Angelia Sieving, MD;  [facility];  Service: Gastroenterology;;   POLYPECTOMY  08/29/2020   Procedure: POLYPECTOMY;  Surgeon: Eartha Angelia Sieving, MD;  [facility];  Service: Gastroenterology;;   POLYPECTOMY  07/24/2022   Procedure: POLYPECTOMY;  Surgeon: Eartha Angelia Sieving, MD;  [facility];  Service: Gastroenterology;;   [facility] REMOVAL N/A 01/06/2019   Procedure: REMOVAL PORT-A-CATH;  Surgeon: Gail Favorite, MD;  Location: [facility];  Service: General;  Laterality: N/A;   PORTACATH PLACEMENT Right 06/16/2018   Procedure: INSERTION PORT-A-CATH;  Surgeon: Gail Favorite, MD;  Location: [facility];  Service: General;  Laterality: Right;   RADIOLOGY WITH ANESTHESIA N/A 04/25/2021   Procedure: IR WITH ANESTHESIA EMBOLIZATION;  Surgeon: Dolphus Carrion, MD;  Location: [facility];  Service: Radiology;  Laterality: N/A;   SCLEROTHERAPY  07/24/2022   Procedure: SCLEROTHERAPY;  Surgeon: Eartha Angelia, Sieving, MD;  [facility];  Service: Gastroenterology;;   SUBMUCOSAL LIFTING INJECTION  08/29/2020   Procedure: SUBMUCOSAL LIFTING  INJECTION;  Surgeon: Eartha Angelia, Sieving, MD;  [facility];  Service: Gastroenterology;;   SUBMUCOSAL LIFTING INJECTION  07/24/2022   Procedure: SUBMUCOSAL LIFTING INJECTION;  Surgeon: Eartha Angelia Sieving, MD;  [facility];  Service: Gastroenterology;;   TONSILLECTOMY  1970   Patient Active Problem List   Diagnosis Date Noted   Closed fracture of left fibula and tibia 02/02/2023   Acute diarrhea 12/23/2022   Functional dyspepsia 07/01/2022   DOE (dyspnea on exertion) 01/15/2022   Nausea with vomiting 05/28/2021   Duodenal adenoma 05/28/2021   Brain aneurysm 04/25/2021   History of herpes simplex infection 01/08/2021   Papanicolaou smear, as part of routine gynecological examination 01/08/2021   CVA (cerebral vascular accident) (HCC) 01/04/2021   Acute ischemic stroke (HCC) 01/03/2021   Hypokalemia 01/03/2021   Cerebral aneurysm 01/03/2021   Musculoskeletal pain 01/03/2021   Early satiety 11/27/2020   Abdominal discomfort 07/13/2020   Malignant neoplasm of upper-inner quadrant of left breast in female, estrogen receptor positive (HCC) 04/16/2018   Essential hypertension 03/21/2015   Hyperlipidemia 03/21/2015   Genital herpes  03/21/2015   Fecal urgency 03/21/2015   GERD (gastroesophageal reflux disease) 03/21/2015    ONSET DATE: ~2021  REFERRING DIAG: G20.A1 (ICD-10-CM) - Parkinson's disease without dyskinesia or fluctuating manifestations (HCC)  THERAPY DIAG:  Dysarthria  Rationale for Evaluation and Treatment: Rehabilitation  SUBJECTIVE:   SUBJECTIVE STATEMENT: I was very tired yesterday.  Pt accompanied by: self  PERTINENT HISTORY: Kimberly Mccormick is a 74 y.o. female who is referred by Harlene Bogaert, NP (neurology) for SLP evaluation and treatment in setting of left-sided predominant Parkinson's disease.  Initially seen by Dr. Buck 11/2019 for several year history of intermittent left hand tremors and exam showed evidence of mild  left-sided parkinsonism.  DaTscan  02/2020 supportive of diagnosis. Hx of right corona radiata infarct 12/2020 with incidental finding of cerebral aneurysm on workup s/p endovascular treatment with flow diverter device 04/2021 by Dr. Dolphus. She was evaluated and seen for one treatment session for SLP a year ago, but had to cancel her appointments due to a fall on ice.  PAIN:  Are you having pain? No  FALLS: Has patient fallen in last 6 months?  No  LIVING ENVIRONMENT: Lives with: lives with their family and lives with their son Lives in: House/apartment  PLOF:  Level of assistance: Independent with ADLs, Independent with IADLs Employment: Retired  PATIENT GOALS: Increase loudness for speech  OBJECTIVE:  Note: Objective measures were completed at Evaluation unless otherwise noted.  DIAGNOSTIC FINDINGS:  04/15/2022 MRI HEAD FINDINGS IMPRESSION: 1. No acute brain finding. Moderate chronic small-vessel ischemic changes of the cerebral hemispheric white matter. 2. Left internal carotid artery flow diverting stent. No residual visualized flow in the region of the medially directed aneurysm. 3. 4 mm aneurysm projecting laterally from the petrous carotid on the left. No change.   02/23/2020 NUCLEAR MEDICINE BRAIN IMAGING WITH SPECT (DaTscan  ) IMPRESSION: Significant decreased striatal Ioflupane activity most prominent in the posterior striatum. This pattern can be seen in Parkinsonian syndromes.  COGNITION: Overall cognitive status: Impaired: Areas of impairment:  Memory: Impaired: Working Teacher, Music term  Areas of impairment: Memory Comments: 20/30 on the [facility] SLUMS  MOTOR SPEECH: Overall motor speech: impaired Level of impairment: Conversation Respiration: diaphragmatic/abdominal breathing Phonation: normal and mild glottal fry Resonance: WFL Articulation: Appears intact Intelligibility: Intelligible Motor planning: Appears intact Motor speech errors: N/A Interfering  components: fluctuation throughout the day likely Effective technique: increased vocal intensity  ORAL MOTOR EXAMINATION: Overall status: WFL Comments: N/A and Pt reports no current dysphagia symptoms  OBJECTIVE VOICE ASSESSMENT: Sustained ah maximum phonation time: 17.7 seconds Sustained ah loudness average: 92 dB Oral reading (passage) loudness average: 75 dB Conversational loudness average: 70 dB Voice quality: occasional glottal fry observed Stimulability trials: Given SLP modeling and occasional min cues, loudness average increased to 75dB at conversation level.  Comments: Pt automatically increased vocal intensity during recorded and microphone displayed tasks  Completed audio recording of patients baseline voice without cueing from SLP: Yes  Pt does not report difficulty with swallowing which does not warrant further evaluation.  Previous Treatment: Pt brought her SPEAKOUT! Therapy workbook today. SLP provided modeling of each exercise and asked Pt to return demonstrate to ensure accuracy. She benefited from mi/mod cues for smooth and continuous to assist with breath support. Pt with occasional glottal fry observed on /a/ glides downward and cued to stop if she hears it. She demonstrated good intent in all structured tasks, but was noted to decrease slightly in spontaneous conversation. SLP provided min prompts when she shared about her upcoming birthday celebration to increase intent with good results. She also sang what she plans to sing in her church this Sunday and appeared pleased when SLP shared that I could understand every word. SLP showed her the first few minutes of the What is Parkinson's video and texted her the link to review at home (she does not have email). Continue with Speakout! Workbook 2x/day.  TREATMENT DATE: 02/05/24 Pt admits to not practicing  exercises yesterday due to fatigue. In session, we used her phone to record samples of our exercises so she can use at home as needed. Pt completed Lesson 2 of Speakout! Exercises with mi/mod assist from SLP. She exhibits glottal fry at times and a low vocal register. SLP provided audio recording for feeback and cued to breathe from her belly (analogy of a round barrel). SLP also provided modeling and written cues to add other easy onset voicing techniques to eliminate glottal fry (yaya, hello 1, num num, haa) which was met with modest success. Pt most successful with Hello 1, hello 2...). Pt also given a communication template to add relevant vocabulary so she can practice reading aloud at home (restaurants and food items). Pt states that she would like to cook more at home instead of ordering out so we created a list of food items she would like to get from [employer]. Continue to target goals.     PATIENT EDUCATION: Education details: SPEAKOUT! Therapy lesson 3+ Person educated: Patient Education method: Explanation and Demonstration Education comprehension: verbalized understanding  HOME EXERCISE PROGRAM: Pt will completed HEP as assigned to facilitate carryover of treatment strategies and techniques in home and community environment with written cues.  GOALS: Goals reviewed with patient? Yes   SHORT TERM GOALS: Target date: 02/26/2024   Pt will coordinate vocal and articulatory subsystems in hierarchical speech tasks by producing sounds with intention with min assistance. Baseline: introduced this date, min/mod Goal status: ONGOING   2.  Generalize intentional speech to cognitive-linguistic exercises and conversational speech with improved vocal quality, loudness, articulatory precision, and endurance while maintaining a minimum of 85 dB with min assistance. Baseline: 68 dB Goal status: ONGOING   3.  Read phrases, sentences, and paragraphs with intention, yielding improved vocal quality,  loudness, articulatory precision, and endurance while maintaining a minimum of 85 dB with min assistance. Baseline: 75 dB Goal status: ONGOING   LONG TERM GOALS: Target date: 02/2024   The patient will increase intensity in conversational speech allowing them to converse in the community, increase voice use, slow progression of vocal deterioration, and improve their QOL with indirect cues prn. Baseline: min/mod cues Goal status: ONGOING  ASSESSMENT:  CLINICAL IMPRESSION: Patient is a 74 y.o. female who was seen today for a cognitive linguistic evaluation in setting of Parkinson's disease. Pt presents with mild cognitive linguistic deficits ([facility] SLUMS 20/30) and mild hypokinetic dysarthria characterized by attention and working memory deficits, hoarse/strained vocal quality with glottal fry and fluctuations in loudness. Pt reports feeling as if she is not talking  loud enough and has to make an effort to speak louder. While she sustained /a/ for 18 seconds at a level of 92 dB, her volume in conversation was sub optimal at ~68 dB. Pt counted from 1-10 with average of 68 dB and when cued for increased vocal intensity and intent, this improved to 82 dB. Pt is motivated to improve her speech and showed improvement when cues provided by SLP. Recommend SPEAKOUT! Therapy for 6+ sessions and plan for discharge to a group setting to continue and maintain gains. Pt is in agreement with plan of care.  OBJECTIVE IMPAIRMENTS: Objective impairments include memory and dysarthria. These impairments are limiting patient from effectively communicating at home and in community.Factors affecting potential to achieve goals and functional outcome are previous level of function.. Patient will benefit from skilled SLP services to address above impairments and improve overall function.  REHAB POTENTIAL: Excellent  PLAN:  SLP FREQUENCY: 1-2x/week  SLP DURATION: 4 weeks  PLANNED INTERVENTIONS: Cueing hierachy,  Internal/external aids, Multimodal communication approach, SLP instruction and feedback, Compensatory strategies, Patient/family education, [phone] Treatment of speech (30 or 45 min) , and [id number]- Speech [address], Artic, Phon, Eval Compre, Express   Thank you,  Lamar Candy, CCC-SLP [phone]  Marleta Lapierre, CCC-SLP 02/05/2024, 12:29 PM      "

## 2024-02-10 ENCOUNTER — Ambulatory Visit (HOSPITAL_COMMUNITY): Admitting: Occupational Therapy

## 2024-02-10 ENCOUNTER — Encounter (HOSPITAL_COMMUNITY): Payer: Self-pay | Admitting: Speech Pathology

## 2024-02-10 ENCOUNTER — Ambulatory Visit (HOSPITAL_COMMUNITY): Admitting: Speech Pathology

## 2024-02-10 ENCOUNTER — Ambulatory Visit (HOSPITAL_COMMUNITY)

## 2024-02-10 ENCOUNTER — Encounter (HOSPITAL_COMMUNITY): Payer: Self-pay | Admitting: Occupational Therapy

## 2024-02-10 ENCOUNTER — Encounter (HOSPITAL_COMMUNITY): Payer: Self-pay

## 2024-02-10 DIAGNOSIS — R2689 Other abnormalities of gait and mobility: Secondary | ICD-10-CM

## 2024-02-10 DIAGNOSIS — R29818 Other symptoms and signs involving the nervous system: Secondary | ICD-10-CM

## 2024-02-10 DIAGNOSIS — R471 Dysarthria and anarthria: Secondary | ICD-10-CM

## 2024-02-10 DIAGNOSIS — M6281 Muscle weakness (generalized): Secondary | ICD-10-CM

## 2024-02-10 DIAGNOSIS — R278 Other lack of coordination: Secondary | ICD-10-CM

## 2024-02-10 NOTE — Therapy (Signed)
 " OUTPATIENT SPEECH LANGUAGE PATHOLOGY P[facility]INSON'S TREATMENT   Patient Name: Kimberly Mccormick MRN: [id number] DOB:1950-06-10, 74 y.o., female Today's Date: 02/10/2024  PCP: [name], MD REFERRING PROVIDER: Whitfield Raisin, NP  END OF SESSION:  End of Session - 02/10/24 1521     Visit Number 4    Number of Visits 7    Date for Recertification  02/26/24    Authorization Type [employer] Dual Complete    SLP Start Time 1500    SLP Stop Time  1545    SLP Time Calculation (min) 45 min    Activity Tolerance Patient tolerated treatment well          Past Medical History:  Diagnosis Date   Aneurysm of left internal carotid artery    Small supraclinoid 5 mm   Breast cancer (HCC)    Left s/p lumpectomy (05/06/2018) and adjuvant chemotherapy (06/23/2018) with Adriamycin and Cytoxan  x4 followed by Taxol weekly x12   DDD (degenerative disc disease), lumbar    Essential hypertension    Genital warts    GERD (gastroesophageal reflux disease)    History of anemia    History of renal insufficiency    Hypertension    Insomnia    Iron deficiency anemia    Ischemic stroke [facility])    December 2022   Mixed hyperlipidemia    Osteoarthritis    Parkinson's disease (HCC)    Peripheral neuropathy    Related to chemotherapy   Personal history of chemotherapy 2020   Port-A-Cath in place 06/23/2018   Past Surgical History:  Procedure Laterality Date   APPENDECTOMY  1966   BIOPSY  08/29/2020   Procedure: BIOPSY;  Surgeon: Eartha Angelia Sieving, MD;  [facility];  Service: Gastroenterology;;   BIOPSY  07/24/2022   Procedure: BIOPSY;  Surgeon: Eartha Angelia Sieving, MD;  [facility];  Service: Gastroenterology;;   BREAST BIOPSY Left 08/19/2023   US  LT BREAST BX W LOC DEV 1ST LESION IMG BX SPEC US  GUIDE 08/19/2023 [facility]-ULTRASOUND   BREAST LUMPECTOMY WITH RADIOACTIVE SEED AND SENTINEL LYMPH NODE BIOPSY Left 05/06/2018   Procedure: LEFT BREAST LUMPECTOMY WITH  RADIOACTIVE SEED AND LEFT DEEP AXILLARY SENTINEL LYMPH NODE BIOPSY AND BLUE DYE INJECTION;  Surgeon: Gail Favorite, MD;  Location: [facility];  Service: General;  Laterality: Left;   CATARACT EXTRACTION W/PHACO Left 01/10/2014   Procedure: CATARACT EXTRACTION PHACO AND INTRAOCULAR LENS PLACEMENT ; CDE:  4.94;  Surgeon: Dow JULIANNA Burke, MD;  Location: [facility];  Service: Ophthalmology;  Laterality: Left;   CESAREAN SECTION  1986   CHOLECYSTECTOMY     COLONOSCOPY WITH PROPOFOL  N/A 07/25/2020   Procedure: COLONOSCOPY WITH PROPOFOL ;  Surgeon: Eartha Angelia Sieving, MD;  [facility];  Service: Gastroenterology;  Laterality: N/A;  10:55   ESOPHAGOGASTRODUODENOSCOPY N/A 12/30/2023   Procedure: EGD (ESOPHAGOGASTRODUODENOSCOPY);  Surgeon: Eartha Angelia, Sieving, MD;  [facility];  Service: Gastroenterology;  Laterality: N/A;  9:15 am, asa 3   ESOPHAGOGASTRODUODENOSCOPY (EGD) WITH PROPOFOL  N/A 08/29/2020   Procedure: ESOPHAGOGASTRODUODENOSCOPY (EGD) WITH PROPOFOL ;  Surgeon: Eartha Angelia Sieving, MD;  [facility];  Service: Gastroenterology;  Laterality: N/A;  12:00   ESOPHAGOGASTRODUODENOSCOPY (EGD) WITH PROPOFOL  N/A 07/24/2022   Procedure: ESOPHAGOGASTRODUODENOSCOPY (EGD) WITH PROPOFOL ;  Surgeon: Eartha Angelia Sieving, MD;  [facility];  Service: Gastroenterology;  Laterality: N/A;  12:45;ASA 1-2   HOT HEMOSTASIS  07/24/2022   Procedure: HOT HEMOSTASIS (ARGON PLASMA COAGULATION/BICAP);  Surgeon: Eartha  Angelia Sieving, MD;  [facility];  Service: Gastroenterology;;   IR 3D INDEPENDENT WKST  04/25/2021   IR ANGIO INTRA EXTRACRAN SEL COM CAROTID INNOMINATE UNI R MOD SED  04/25/2021   IR ANGIO INTRA EXTRACRAN SEL INTERNAL CAROTID UNI L MOD SED  04/25/2021   IR ANGIOGRAM FOLLOW UP STUDY  04/25/2021   IR CT HEAD LTD  04/25/2021   IR NEURO EACH ADD'L AFTER BASIC UNI LEFT (MS)  04/25/2021   IR RADIOLOGIST EVAL & MGMT  03/22/2021   IR  RADIOLOGIST EVAL & MGMT  03/28/2021   IR RADIOLOGIST EVAL & MGMT  05/15/2021   IR RADIOLOGIST EVAL & MGMT  09/28/2021   IR TRANSCATH/EMBOLIZ  04/25/2021   IR US  GUIDE VASC ACCESS RIGHT  04/25/2021   MYRINGOTOMY WITH TUBE PLACEMENT Right 02/04/2017   Procedure: REVISION OF RIGHT MYRINGOTOMY WITH TUBE PLACEMENT, WITH EXAM OF LEFT EAR;  Surgeon: Karis Clunes, MD;  Location: [facility];  Service: ENT;  Laterality: Right;   NASAL SINUS SURGERY  2016   polypectomy   OPEN REDUCTION INTERNAL FIXATION (ORIF) TIBIA/FIBULA FRACTURE Left 02/02/2023   Procedure: OPEN REDUCTION INTERNAL FIXATION (ORIF) LEFT TIBIA/FIBULA FRACTURE;  Surgeon: Georgina Ozell LABOR, MD;  Location: [facility];  Service: Orthopedics;  Laterality: Left;   POLYPECTOMY  07/25/2020   Procedure: POLYPECTOMY;  Surgeon: Eartha Angelia Sieving, MD;  [facility];  Service: Gastroenterology;;   POLYPECTOMY  08/29/2020   Procedure: POLYPECTOMY;  Surgeon: Eartha Angelia Sieving, MD;  [facility];  Service: Gastroenterology;;   POLYPECTOMY  07/24/2022   Procedure: POLYPECTOMY;  Surgeon: Eartha Angelia Sieving, MD;  [facility];  Service: Gastroenterology;;   [facility] REMOVAL N/A 01/06/2019   Procedure: REMOVAL PORT-A-CATH;  Surgeon: Gail Favorite, MD;  Location: [facility];  Service: General;  Laterality: N/A;   PORTACATH PLACEMENT Right 06/16/2018   Procedure: INSERTION PORT-A-CATH;  Surgeon: Gail Favorite, MD;  Location: [facility];  Service: General;  Laterality: Right;   RADIOLOGY WITH ANESTHESIA N/A 04/25/2021   Procedure: IR WITH ANESTHESIA EMBOLIZATION;  Surgeon: Dolphus Carrion, MD;  Location: [facility];  Service: Radiology;  Laterality: N/A;   SCLEROTHERAPY  07/24/2022   Procedure: SCLEROTHERAPY;  Surgeon: Eartha Angelia, Sieving, MD;  [facility];  Service: Gastroenterology;;   SUBMUCOSAL LIFTING INJECTION  08/29/2020   Procedure: SUBMUCOSAL LIFTING  INJECTION;  Surgeon: Eartha Angelia, Sieving, MD;  [facility];  Service: Gastroenterology;;   SUBMUCOSAL LIFTING INJECTION  07/24/2022   Procedure: SUBMUCOSAL LIFTING INJECTION;  Surgeon: Eartha Angelia Sieving, MD;  [facility];  Service: Gastroenterology;;   TONSILLECTOMY  1970   Patient Active Problem List   Diagnosis Date Noted   Closed fracture of left fibula and tibia 02/02/2023   Acute diarrhea 12/23/2022   Functional dyspepsia 07/01/2022   DOE (dyspnea on exertion) 01/15/2022   Nausea with vomiting 05/28/2021   Duodenal adenoma 05/28/2021   Brain aneurysm 04/25/2021   History of herpes simplex infection 01/08/2021   Papanicolaou smear, as part of routine gynecological examination 01/08/2021   CVA (cerebral vascular accident) (HCC) 01/04/2021   Acute ischemic stroke (HCC) 01/03/2021   Hypokalemia 01/03/2021   Cerebral aneurysm 01/03/2021   Musculoskeletal pain 01/03/2021   Early satiety 11/27/2020   Abdominal discomfort 07/13/2020   Malignant neoplasm of upper-inner quadrant of left breast in female, estrogen receptor positive (HCC) 04/16/2018   Essential hypertension 03/21/2015   Hyperlipidemia 03/21/2015   Genital herpes  03/21/2015   Fecal urgency 03/21/2015   GERD (gastroesophageal reflux disease) 03/21/2015    ONSET DATE: ~2021  REFERRING DIAG: G20.A1 (ICD-10-CM) - Parkinson's disease without dyskinesia or fluctuating manifestations (HCC)  THERAPY DIAG:  Dysarthria  Rationale for Evaluation and Treatment: Rehabilitation  SUBJECTIVE:   SUBJECTIVE STATEMENT: I sang in church!  Pt accompanied by: self  PERTINENT HISTORY: Kimberly Mccormick is a 74 y.o. female who is referred by Harlene Bogaert, NP (neurology) for SLP evaluation and treatment in setting of left-sided predominant Parkinson's disease.  Initially seen by Dr. Buck 11/2019 for several year history of intermittent left hand tremors and exam showed evidence of mild left-sided  parkinsonism.  DaTscan  02/2020 supportive of diagnosis. Hx of right corona radiata infarct 12/2020 with incidental finding of cerebral aneurysm on workup s/p endovascular treatment with flow diverter device 04/2021 by Dr. Dolphus. She was evaluated and seen for one treatment session for SLP a year ago, but had to cancel her appointments due to a fall on ice.  PAIN:  Are you having pain? No  FALLS: Has patient fallen in last 6 months?  No  LIVING ENVIRONMENT: Lives with: lives with their family and lives with their son Lives in: House/apartment  PLOF:  Level of assistance: Independent with ADLs, Independent with IADLs Employment: Retired  PATIENT GOALS: Increase loudness for speech  OBJECTIVE:  Note: Objective measures were completed at Evaluation unless otherwise noted.  DIAGNOSTIC FINDINGS:  04/15/2022 MRI HEAD FINDINGS IMPRESSION: 1. No acute brain finding. Moderate chronic small-vessel ischemic changes of the cerebral hemispheric white matter. 2. Left internal carotid artery flow diverting stent. No residual visualized flow in the region of the medially directed aneurysm. 3. 4 mm aneurysm projecting laterally from the petrous carotid on the left. No change.   02/23/2020 NUCLEAR MEDICINE BRAIN IMAGING WITH SPECT (DaTscan  ) IMPRESSION: Significant decreased striatal Ioflupane activity most prominent in the posterior striatum. This pattern can be seen in Parkinsonian syndromes.  COGNITION: Overall cognitive status: Impaired: Areas of impairment:  Memory: Impaired: Working Teacher, Music term  Areas of impairment: Memory Comments: 20/30 on the [facility] SLUMS  MOTOR SPEECH: Overall motor speech: impaired Level of impairment: Conversation Respiration: diaphragmatic/abdominal breathing Phonation: normal and mild glottal fry Resonance: WFL Articulation: Appears intact Intelligibility: Intelligible Motor planning: Appears intact Motor speech errors: N/A Interfering components:  fluctuation throughout the day likely Effective technique: increased vocal intensity  ORAL MOTOR EXAMINATION: Overall status: WFL Comments: N/A and Pt reports no current dysphagia symptoms  OBJECTIVE VOICE ASSESSMENT: Sustained ah maximum phonation time: 17.7 seconds Sustained ah loudness average: 92 dB Oral reading (passage) loudness average: 75 dB Conversational loudness average: 70 dB Voice quality: occasional glottal fry observed Stimulability trials: Given SLP modeling and occasional min cues, loudness average increased to 75dB at conversation level.  Comments: Pt automatically increased vocal intensity during recorded and microphone displayed tasks  Completed audio recording of patients baseline voice without cueing from SLP: Yes  Pt does not report difficulty with swallowing which does not warrant further evaluation.  Previous Treatment: Pt brought her SPEAKOUT! Therapy workbook today. SLP provided modeling of each exercise and asked Pt to return demonstrate to ensure accuracy. She benefited from mi/mod cues for smooth and continuous to assist with breath support. Pt with occasional glottal fry observed on /a/ glides downward and cued to stop if she hears it. She demonstrated good intent in all structured tasks, but was noted to decrease slightly in spontaneous conversation. SLP provided min prompts when she shared about her upcoming birthday celebration to increase intent with good results. She also sang what she plans to sing in her church this Sunday and appeared pleased when SLP shared that I could understand every word. SLP showed her the first few minutes of the What is Parkinson's video and texted her the link to review at home (she does not have email). Continue with Speakout! Workbook 2x/day.  Previous Treatment: Pt admits to not practicing exercises  yesterday due to fatigue. In session, we used her phone to record samples of our exercises so she can use at home as needed. Pt completed Lesson 2 of Speakout! Exercises with mi/mod assist from SLP. She exhibits glottal fry at times and a low vocal register. SLP provided audio recording for feeback and cued to breathe from her belly (analogy of a round barrel). SLP also provided modeling and written cues to add other easy onset voicing techniques to eliminate glottal fry (yaya, hello 1, num num, haa) which was met with modest success. Pt most successful with Hello 1, hello 2...). Pt also given a communication template to add relevant vocabulary so she can practice reading aloud at home (restaurants and food items). Pt states that she would like to cook more at home instead of ordering out so we created a list of food items she would like to get from [employer]. Continue to target goals.  TREATMENT DATE: 02/10/24  Pt was excited to share that she sang a solo in church with use of a microphone and that she received a lot of positive feedback. SLP encouraged her to continue singing with the choir and at home on her own to practice as an extension of SPEAKOUT! Reduction in glottal fry noted today. She started to doze off some in our session due to this being her third treatment session of the day, but participated in [facility]! Lesson 4. She benefits from SLP modeling to begin each exercise, despite having an audio recording we made to use at home. She was also shown how to access the [place] from her phone to complete exercises with the examples they have. She required mi/mod cues for the first 5 exercises and reduced cues for counting, oral reading, and cognitive communication task. Pt encouraged to continue with twice daily exercises going forward.    PATIENT EDUCATION: Education details: SPEAKOUT! Therapy lesson 4+ Person educated: Patient Education method: Explanation and  Demonstration Education comprehension: verbalized understanding  HOME EXERCISE PROGRAM: Pt will completed HEP as assigned to facilitate carryover of treatment strategies and techniques in home and community environment with written cues.  GOALS: Goals reviewed with patient? Yes   SHORT TERM GOALS: Target date: 02/26/2024   Pt will coordinate vocal and articulatory subsystems in hierarchical speech tasks by producing sounds with intention with min assistance. Baseline: introduced this date, min/mod Goal status: ONGOING   2.  Generalize intentional speech to cognitive-linguistic exercises and conversational speech with improved vocal quality, loudness, articulatory precision, and endurance while maintaining a minimum of 85 dB with min  assistance. Baseline: 68 dB Goal status: ONGOING   3.  Read phrases, sentences, and paragraphs with intention, yielding improved vocal quality, loudness, articulatory precision, and endurance while maintaining a minimum of 85 dB with min assistance. Baseline: 75 dB Goal status: ONGOING   LONG TERM GOALS: Target date: 02/2024   The patient will increase intensity in conversational speech allowing them to converse in the community, increase voice use, slow progression of vocal deterioration, and improve their QOL with indirect cues prn. Baseline: min/mod cues Goal status: ONGOING  ASSESSMENT:  CLINICAL IMPRESSION: Patient is a 74 y.o. female who was seen today for a cognitive linguistic evaluation in setting of Parkinson's disease. Pt presents with mild cognitive linguistic deficits ([facility] SLUMS 20/30) and mild hypokinetic dysarthria characterized by attention and working memory deficits, hoarse/strained vocal quality with glottal fry and fluctuations in loudness. Pt reports feeling as if she is not talking loud enough and has to make an effort to speak louder. While she sustained /a/ for 18 seconds at a level of 92 dB, her volume in conversation was sub  optimal at ~68 dB. Pt counted from 1-10 with average of 68 dB and when cued for increased vocal intensity and intent, this improved to 82 dB. Pt is motivated to improve her speech and showed improvement when cues provided by SLP. Recommend SPEAKOUT! Therapy for 6+ sessions and plan for discharge to a group setting to continue and maintain gains. Pt is in agreement with plan of Mccormick.  OBJECTIVE IMPAIRMENTS: Objective impairments include memory and dysarthria. These impairments are limiting patient from effectively communicating at home and in community.Factors affecting potential to achieve goals and functional outcome are previous level of function.. Patient will benefit from skilled SLP services to address above impairments and improve overall function.  REHAB POTENTIAL: Excellent  PLAN:  SLP FREQUENCY: 1-2x/week  SLP DURATION: 4 weeks  PLANNED INTERVENTIONS: Cueing hierachy, Internal/external aids, Multimodal communication approach, SLP instruction and feedback, Compensatory strategies, Patient/family education, [phone] Treatment of speech (30 or 45 min) , and [id number]- Speech [id number], Artic, Phon, Eval Compre, Express   Thank you,  Lamar Candy, CCC-SLP [phone]  Usman Millett, CCC-SLP 02/10/2024, 3:26 PM      "

## 2024-02-10 NOTE — Therapy (Signed)
 " OUTPATIENT PHYSICAL THERAPY NEURO TREATMENT   Patient Name: Kimberly Mccormick MRN: [id number] DOB:22-Apr-1950, 74 y.o., female Today's Date: 02/10/2024   PCP: [name], MD  REFERRING PROVIDER: Whitfield Raisin, NP   END OF SESSION:  PT End of Session - 02/10/24 1410     Visit Number 3    Number of Visits 8    Date for Recertification  03/19/24    Authorization Type [employer]    Authorization Time Period no authorization required    PT Start Time 1415    PT Stop Time 1455    PT Time Calculation (min) 40 min    Equipment Utilized During Treatment --    Activity Tolerance Patient tolerated treatment well    Behavior During Therapy WFL for tasks assessed/performed           Past Medical History:  Diagnosis Date   Aneurysm of left internal carotid artery    Small supraclinoid 5 mm   Breast cancer (HCC)    Left s/p lumpectomy (05/06/2018) and adjuvant chemotherapy (06/23/2018) with Adriamycin and Cytoxan  x4 followed by Taxol weekly x12   DDD (degenerative disc disease), lumbar    Essential hypertension    Genital warts    GERD (gastroesophageal reflux disease)    History of anemia    History of renal insufficiency    Hypertension    Insomnia    Iron deficiency anemia    Ischemic stroke [facility])    December 2022   Mixed hyperlipidemia    Osteoarthritis    Parkinson's disease (HCC)    Peripheral neuropathy    Related to chemotherapy   Personal history of chemotherapy 2020   Port-A-Cath in place 06/23/2018   Past Surgical History:  Procedure Laterality Date   APPENDECTOMY  1966   BIOPSY  08/29/2020   Procedure: BIOPSY;  Surgeon: Eartha Angelia Sieving, MD;  [facility];  Service: Gastroenterology;;   BIOPSY  07/24/2022   Procedure: BIOPSY;  Surgeon: Eartha Angelia Sieving, MD;  [facility];  Service: Gastroenterology;;   BREAST BIOPSY Left 08/19/2023   US  LT BREAST BX W LOC DEV 1ST LESION IMG BX SPEC US  GUIDE 08/19/2023 [facility]-ULTRASOUND    BREAST LUMPECTOMY WITH RADIOACTIVE SEED AND SENTINEL LYMPH NODE BIOPSY Left 05/06/2018   Procedure: LEFT BREAST LUMPECTOMY WITH RADIOACTIVE SEED AND LEFT DEEP AXILLARY SENTINEL LYMPH NODE BIOPSY AND BLUE DYE INJECTION;  Surgeon: Gail Favorite, MD;  Location: [facility];  Service: General;  Laterality: Left;   CATARACT EXTRACTION W/PHACO Left 01/10/2014   Procedure: CATARACT EXTRACTION PHACO AND INTRAOCULAR LENS PLACEMENT ; CDE:  4.94;  Surgeon: Dow JULIANNA Burke, MD;  Location: [facility];  Service: Ophthalmology;  Laterality: Left;   CESAREAN SECTION  1986   CHOLECYSTECTOMY     COLONOSCOPY WITH PROPOFOL  N/A 07/25/2020   Procedure: COLONOSCOPY WITH PROPOFOL ;  Surgeon: Eartha Angelia Sieving, MD;  [facility];  Service: Gastroenterology;  Laterality: N/A;  10:55   ESOPHAGOGASTRODUODENOSCOPY N/A 12/30/2023   Procedure: EGD (ESOPHAGOGASTRODUODENOSCOPY);  Surgeon: Eartha Angelia, Sieving, MD;  [facility];  Service: Gastroenterology;  Laterality: N/A;  9:15 am, asa 3   ESOPHAGOGASTRODUODENOSCOPY (EGD) WITH PROPOFOL  N/A 08/29/2020   Procedure: ESOPHAGOGASTRODUODENOSCOPY (EGD) WITH PROPOFOL ;  Surgeon: Eartha Angelia Sieving, MD;  [facility];  Service: Gastroenterology;  Laterality: N/A;  12:00   ESOPHAGOGASTRODUODENOSCOPY (EGD) WITH PROPOFOL  N/A 07/24/2022   Procedure: ESOPHAGOGASTRODUODENOSCOPY (EGD) WITH PROPOFOL ;  Surgeon: Eartha Angelia Sieving, MD;  [facility];  Service: Gastroenterology;  Laterality: N/A;  12:45;ASA 1-2   HOT HEMOSTASIS  07/24/2022   Procedure: HOT HEMOSTASIS (ARGON PLASMA COAGULATION/BICAP);  Surgeon: Eartha Flavors, Toribio, MD;  [facility];  Service: Gastroenterology;;   IR 3D INDEPENDENT WKST  04/25/2021   IR ANGIO INTRA EXTRACRAN SEL COM CAROTID INNOMINATE UNI R MOD SED  04/25/2021   IR ANGIO INTRA EXTRACRAN SEL INTERNAL CAROTID UNI L MOD SED  04/25/2021   IR ANGIOGRAM FOLLOW UP STUDY  04/25/2021   IR  CT HEAD LTD  04/25/2021   IR NEURO EACH ADD'L AFTER BASIC UNI LEFT (MS)  04/25/2021   IR RADIOLOGIST EVAL & MGMT  03/22/2021   IR RADIOLOGIST EVAL & MGMT  03/28/2021   IR RADIOLOGIST EVAL & MGMT  05/15/2021   IR RADIOLOGIST EVAL & MGMT  09/28/2021   IR TRANSCATH/EMBOLIZ  04/25/2021   IR US  GUIDE VASC ACCESS RIGHT  04/25/2021   MYRINGOTOMY WITH TUBE PLACEMENT Right 02/04/2017   Procedure: REVISION OF RIGHT MYRINGOTOMY WITH TUBE PLACEMENT, WITH EXAM OF LEFT EAR;  Surgeon: Karis Clunes, MD;  Location: [facility];  Service: ENT;  Laterality: Right;   NASAL SINUS SURGERY  2016   polypectomy   OPEN REDUCTION INTERNAL FIXATION (ORIF) TIBIA/FIBULA FRACTURE Left 02/02/2023   Procedure: OPEN REDUCTION INTERNAL FIXATION (ORIF) LEFT TIBIA/FIBULA FRACTURE;  Surgeon: Georgina Ozell LABOR, MD;  Location: [facility];  Service: Orthopedics;  Laterality: Left;   POLYPECTOMY  07/25/2020   Procedure: POLYPECTOMY;  Surgeon: Eartha Flavors Toribio, MD;  [facility];  Service: Gastroenterology;;   POLYPECTOMY  08/29/2020   Procedure: POLYPECTOMY;  Surgeon: Eartha Flavors Toribio, MD;  [facility];  Service: Gastroenterology;;   POLYPECTOMY  07/24/2022   Procedure: POLYPECTOMY;  Surgeon: Eartha Flavors Toribio, MD;  [facility];  Service: Gastroenterology;;   [facility] REMOVAL N/A 01/06/2019   Procedure: REMOVAL PORT-A-CATH;  Surgeon: Gail Favorite, MD;  Location: [facility];  Service: General;  Laterality: N/A;   PORTACATH PLACEMENT Right 06/16/2018   Procedure: INSERTION PORT-A-CATH;  Surgeon: Gail Favorite, MD;  Location: [facility];  Service: General;  Laterality: Right;   RADIOLOGY WITH ANESTHESIA N/A 04/25/2021   Procedure: IR WITH ANESTHESIA EMBOLIZATION;  Surgeon: Dolphus Carrion, MD;  Location: [facility];  Service: Radiology;  Laterality: N/A;   SCLEROTHERAPY  07/24/2022   Procedure: SCLEROTHERAPY;  Surgeon: Eartha Flavors, Toribio, MD;  [facility];  Service: Gastroenterology;;   SUBMUCOSAL LIFTING INJECTION  08/29/2020   Procedure: SUBMUCOSAL LIFTING INJECTION;  Surgeon: Eartha Flavors, Toribio, MD;  [facility];  Service: Gastroenterology;;   SUBMUCOSAL LIFTING INJECTION  07/24/2022   Procedure: SUBMUCOSAL LIFTING INJECTION;  Surgeon: Eartha Flavors Toribio, MD;  [facility];  Service: Gastroenterology;;   TONSILLECTOMY  1970   Patient Active Problem List   Diagnosis Date Noted   Closed fracture of left fibula and tibia 02/02/2023   Acute diarrhea 12/23/2022   Functional dyspepsia 07/01/2022   DOE (dyspnea on exertion) 01/15/2022   Nausea with vomiting 05/28/2021   Duodenal adenoma 05/28/2021   Brain aneurysm 04/25/2021   History of herpes simplex infection 01/08/2021   Papanicolaou smear, as part of routine gynecological examination 01/08/2021   CVA (cerebral vascular accident) (HCC) 01/04/2021   Acute ischemic stroke (HCC) 01/03/2021   Hypokalemia 01/03/2021   Cerebral aneurysm 01/03/2021   Musculoskeletal pain 01/03/2021   Early satiety 11/27/2020   Abdominal discomfort 07/13/2020  Malignant neoplasm of upper-inner quadrant of left breast in female, estrogen receptor positive (HCC) 04/16/2018   Essential hypertension 03/21/2015   Hyperlipidemia 03/21/2015   Genital herpes 03/21/2015   Fecal urgency 03/21/2015   GERD (gastroesophageal reflux disease) 03/21/2015    ONSET DATE: years  REFERRING DIAG: Parkinson's disease without dyskinesia or fluctuating manifestations   THERAPY DIAG:  Other abnormalities of gait and mobility  Muscle weakness (generalized)  Rationale for Evaluation and Treatment: Rehabilitation  SUBJECTIVE:                                                                                                                                                                                             SUBJECTIVE STATEMENT: Patient reports that she feels alright  today.   Eval:  Patient reports that she has been having trouble getting around for a while now. She notes that it has been getting worse recently. She has not fallen since January 2025. She mainly uses a cane to help her get around. Her son has to help her some clean up around the house as she has trouble holding onto things.  Pt accompanied by: self  PERTINENT HISTORY: Osteoarthritis, Parkinson's disease, history of CVA, peripheral neuropathy, hypertension, and history of cancer  PAIN:  Are you having pain? No  PRECAUTIONS: None  RED FLAGS: None   WEIGHT BEARING RESTRICTIONS: No  FALLS: Has patient fallen in last 6 months? No  LIVING ENVIRONMENT: Lives with: lives with their son Lives in: House/apartment Stairs: Yes: External: 3-4 steps; on left going up Has following equipment at home: Single point cane and Walker - 2 wheeled  PLOF: Independent with basic ADLs  PATIENT GOALS: improved mobility  OBJECTIVE:  Note: Objective measures were completed at Evaluation unless otherwise noted.  COGNITION: Overall cognitive status: Within functional limits for tasks assessed  COORDINATION: Heel to shin test: difficulty with touching heel to the opposite shin; partial ROM Left hand: resting tremor  LOWER EXTREMITY MMT:    MMT Right Eval Left Eval  Hip flexion 4-/5 4-/5  Hip extension    Hip abduction    Hip adduction    Hip internal rotation    Hip external rotation    Knee flexion 4-/5 4/5  Knee extension 4-/5 4-/5  Ankle dorsiflexion 3+/5 3+/5  Ankle plantarflexion    Ankle inversion    Ankle eversion    (Blank rows = not tested)  GAIT: Findings: Gait Characteristics: step through pattern, decreased arm swing- Right, decreased arm swing- Left, decreased stride length, Right foot flat, Left foot flat, shuffling, poor foot clearance- Right, and poor foot clearance- Left, Distance walked:  50 feet, Assistive device utilized:Single point cane, and Level of assistance:  SBA  FUNCTIONAL TESTS:  5 times sit to stand: 26.98 seconds Timed up and go (TUG): 36.19 seconds without AD  2 minute walk test: 02/03/24:2MWT 217ft no AD, shuffled gait mechanics, no LOB  PATIENT SURVEYS:  LEFS  Extreme difficulty/unable (0), Quite a bit of difficulty (1), Moderate difficulty (2), Little difficulty (3), No difficulty (4) Survey date:  01/29/24  Any of your usual work, housework or school activities 1  2. Usual hobbies, recreational or sporting activities 2  3. Getting into/out of the bath 3  4. Walking between rooms 4  5. Putting on socks/shoes 1  6. Squatting  2  7. Lifting an object, like a bag of groceries from the floor 2  8. Performing light activities around your home 4  9. Performing heavy activities around your home 0  10. Getting into/out of a car 2  11. Walking 2 blocks 0  12. Walking 1 mile 0  13. Going up/down 10 stairs (1 flight) 3  14. Standing for 1 hour 0  15.  sitting for 1 hour 4  16. Running on even ground 0  17. Running on uneven ground 0  18. Making sharp turns while running fast 0  19. Hopping  0  20. Rolling over in bed 2  Score total:  30/80                                                                                                                                 TREATMENT DATE:                                     02/10/24 EXERCISE LOG  Exercise Repetitions and Resistance Comments  Rocker board  2 minutes    Standing cone tap   2 minutes  Forward; 2 HHA progressing to intermittent UE support   Stepping backward  2 minutes     LAQ  3# x 15 reps each    Standing march  3# x 2 minutes  BUE support from parallel bars   Static stance on foam  2 minutes NBOS  Standing on foam  2.5 minutes   Hitting moving boom sticks   Squatting  15 reps   Nustep  L4 x 5 minutes BUE and BLE   Sit to stand  14 reps  Without UE support    Blank cell = exercise not performed today   02/03/24: Reviewed goals 234ft no AD, shuffled gait  mechanics, no LOB Standing:  PWR up with weighted red ball 10  PWR Reach 10x  PWR twist 10x  Marching 10x  Marching with opposite UE/LE 10x 3 cueing for sequence  Lateral toe tapping between 6in step alternating  Heel raise  Toe raise Nustep UE/LE L3 resistance x , SPM goal >65  Seated: Heel Toe Raises 10 LAQ 10  PATIENT EDUCATION: Education details: HEP, plan of care, objective findings, and goals for physical therapy Person educated: Patient Education method: Explanation, Demonstration, and Handouts Education comprehension: verbalized understanding and returned demonstration  HOME EXERCISE PROGRAM: Access Code: [email or web address] URL: [email or web address] Date: 01/29/2024 Prepared by: Lacinda Fass  Exercises - Sit to Stand  - 1 x daily - 7 x weekly - 3 sets - 5 reps - Seated March  - 1 x daily - 7 x weekly - 3 sets - 10 reps - Seated Heel Toe Raises  - 1 x daily - 7 x weekly - 3 sets - 10 reps  GOALS: Goals reviewed with patient? Yes  LONG TERM GOALS: Target date: 02/26/24  Patient will be independent with her HEP. Baseline:  Goal status: INITIAL  2.  Patient will improve her LEFS score to at least 40/80 for improved perceived function with her daily activities. Baseline:  Goal status: INITIAL  3.  Patient will improve her 5 times sit to stand time to 15 seconds or less to reduce her fall risk. Baseline:  Goal status: INITIAL  4.  Patient will improve her timed up and go time to 20 seconds or less for improved functional mobility. Baseline:  Goal status: INITIAL  ASSESSMENT:  CLINICAL IMPRESSION: Patient was progressed with new and familiar interventions for improved lower extremity stability and functional mobility. She required minimal cueing with today's new interventions for proper biomechanics. She required brief rest breaks throughout treatment due to increased fatigue. She reported feeling tired upon the conclusion of treatment. Patient  continues to require skilled physical therapy to address their remaining impairments to maximize her functional mobility and safety.   Eval:  Patient is a 74 y.o. female who was seen today for physical therapy evaluation and treatment for unsteadiness on her feet and gait deviations secondary to Parkinson's disease.  She has a high fall risk as evidenced by her objective testing and gait mechanics.  She was provided a home exercise program which she was able to properly demonstrate.  Recommend that she continue with skilled physical therapy to address her impairments to maximize her safety and functional mobility.  OBJECTIVE IMPAIRMENTS: Abnormal gait, decreased activity tolerance, decreased balance, decreased coordination, decreased mobility, difficulty walking, and decreased strength.   ACTIVITY LIMITATIONS: carrying, lifting, standing, squatting, transfers, and locomotion level  PARTICIPATION LIMITATIONS: cleaning, shopping, and community activity  PERSONAL FACTORS: Past/current experiences, Time since onset of injury/illness/exacerbation, and 3+ comorbidities: Osteoarthritis, Parkinson's disease, history of CVA, peripheral neuropathy, hypertension, and history of cancer are also affecting patient's functional outcome.   REHAB POTENTIAL: Fair    CLINICAL DECISION MAKING: Evolving/moderate complexity  EVALUATION COMPLEXITY: Moderate  PLAN:  PT FREQUENCY: 2x/week  PT DURATION: 4 weeks  PLANNED INTERVENTIONS: 97110-Therapeutic exercises, 97530- Therapeutic activity, [id number]- Neuromuscular re-education, 97535- Self Care, [id number]- Manual therapy, and Patient/Family education  PLAN FOR NEXT SESSION: gait training, lower extremity strengthening, and balance interventions, PWR activities    Lacinda JAYSON Fass, PT 02/10/2024, 3:12 PM   "

## 2024-02-10 NOTE — Therapy (Signed)
 " OUTPATIENT OCCUPATIONAL THERAPY NEURO TREATMENT NOTE  Patient Name: Kimberly Mccormick MRN: [id number] DOB:12/29/1950, 74 y.o., female Today's Date: 02/10/2024  PCP: Lari Standing, MD REFERRING PROVIDER: Whitfield Raisin, NP  END OF SESSION:  OT End of Session - 02/10/24 1415     Visit Number 4    Number of Visits 6    Date for Recertification  02/27/24    Authorization Type [employer] Dual Complete    OT Start Time 1349    OT Stop Time 1414    OT Time Calculation (min) 25 min    Activity Tolerance Patient tolerated treatment well    Behavior During Therapy WFL for tasks assessed/performed          Past Medical History:  Diagnosis Date   Aneurysm of left internal carotid artery    Small supraclinoid 5 mm   Breast cancer (HCC)    Left s/p lumpectomy (05/06/2018) and adjuvant chemotherapy (06/23/2018) with Adriamycin and Cytoxan  x4 followed by Taxol weekly x12   DDD (degenerative disc disease), lumbar    Essential hypertension    Genital warts    GERD (gastroesophageal reflux disease)    History of anemia    History of renal insufficiency    Hypertension    Insomnia    Iron deficiency anemia    Ischemic stroke [facility])    December 2022   Mixed hyperlipidemia    Osteoarthritis    Parkinson's disease (HCC)    Peripheral neuropathy    Related to chemotherapy   Personal history of chemotherapy 2020   Port-A-Cath in place 06/23/2018   Past Surgical History:  Procedure Laterality Date   APPENDECTOMY  1966   BIOPSY  08/29/2020   Procedure: BIOPSY;  Surgeon: Eartha Angelia Sieving, MD;  [facility];  Service: Gastroenterology;;   BIOPSY  07/24/2022   Procedure: BIOPSY;  Surgeon: Eartha Angelia Sieving, MD;  [facility];  Service: Gastroenterology;;   BREAST BIOPSY Left 08/19/2023   US  LT BREAST BX W LOC DEV 1ST LESION IMG BX SPEC US  GUIDE 08/19/2023 [facility]-ULTRASOUND   BREAST LUMPECTOMY WITH RADIOACTIVE SEED AND SENTINEL LYMPH NODE BIOPSY Left 05/06/2018    Procedure: LEFT BREAST LUMPECTOMY WITH RADIOACTIVE SEED AND LEFT DEEP AXILLARY SENTINEL LYMPH NODE BIOPSY AND BLUE DYE INJECTION;  Surgeon: Gail Favorite, MD;  Location: [facility];  Service: General;  Laterality: Left;   CATARACT EXTRACTION W/PHACO Left 01/10/2014   Procedure: CATARACT EXTRACTION PHACO AND INTRAOCULAR LENS PLACEMENT ; CDE:  4.94;  Surgeon: Dow JULIANNA Burke, MD;  Location: [facility];  Service: Ophthalmology;  Laterality: Left;   CESAREAN SECTION  1986   CHOLECYSTECTOMY     COLONOSCOPY WITH PROPOFOL  N/A 07/25/2020   Procedure: COLONOSCOPY WITH PROPOFOL ;  Surgeon: Eartha Angelia Sieving, MD;  [facility];  Service: Gastroenterology;  Laterality: N/A;  10:55   ESOPHAGOGASTRODUODENOSCOPY N/A 12/30/2023   Procedure: EGD (ESOPHAGOGASTRODUODENOSCOPY);  Surgeon: Eartha Angelia, Sieving, MD;  [facility];  Service: Gastroenterology;  Laterality: N/A;  9:15 am, asa 3   ESOPHAGOGASTRODUODENOSCOPY (EGD) WITH PROPOFOL  N/A 08/29/2020   Procedure: ESOPHAGOGASTRODUODENOSCOPY (EGD) WITH PROPOFOL ;  Surgeon: Eartha Angelia Sieving, MD;  [facility];  Service: Gastroenterology;  Laterality: N/A;  12:00   ESOPHAGOGASTRODUODENOSCOPY (EGD) WITH PROPOFOL  N/A 07/24/2022   Procedure: ESOPHAGOGASTRODUODENOSCOPY (EGD) WITH PROPOFOL ;  Surgeon: Eartha Angelia Sieving, MD;  [facility];  Service: Gastroenterology;  Laterality: N/A;  12:45;ASA 1-2   HOT HEMOSTASIS  07/24/2022   Procedure: HOT  HEMOSTASIS (ARGON PLASMA COAGULATION/BICAP);  Surgeon: Eartha Flavors, Toribio, MD;  [facility];  Service: Gastroenterology;;   IR 3D INDEPENDENT WKST  04/25/2021   IR ANGIO INTRA EXTRACRAN SEL COM CAROTID INNOMINATE UNI R MOD SED  04/25/2021   IR ANGIO INTRA EXTRACRAN SEL INTERNAL CAROTID UNI L MOD SED  04/25/2021   IR ANGIOGRAM FOLLOW UP STUDY  04/25/2021   IR CT HEAD LTD  04/25/2021   IR NEURO EACH ADD'L AFTER BASIC UNI LEFT (MS)  04/25/2021   IR  RADIOLOGIST EVAL & MGMT  03/22/2021   IR RADIOLOGIST EVAL & MGMT  03/28/2021   IR RADIOLOGIST EVAL & MGMT  05/15/2021   IR RADIOLOGIST EVAL & MGMT  09/28/2021   IR TRANSCATH/EMBOLIZ  04/25/2021   IR US  GUIDE VASC ACCESS RIGHT  04/25/2021   MYRINGOTOMY WITH TUBE PLACEMENT Right 02/04/2017   Procedure: REVISION OF RIGHT MYRINGOTOMY WITH TUBE PLACEMENT, WITH EXAM OF LEFT EAR;  Surgeon: Karis Clunes, MD;  Location: [facility];  Service: ENT;  Laterality: Right;   NASAL SINUS SURGERY  2016   polypectomy   OPEN REDUCTION INTERNAL FIXATION (ORIF) TIBIA/FIBULA FRACTURE Left 02/02/2023   Procedure: OPEN REDUCTION INTERNAL FIXATION (ORIF) LEFT TIBIA/FIBULA FRACTURE;  Surgeon: Georgina Ozell LABOR, MD;  Location: [facility];  Service: Orthopedics;  Laterality: Left;   POLYPECTOMY  07/25/2020   Procedure: POLYPECTOMY;  Surgeon: Eartha Flavors Toribio, MD;  [facility];  Service: Gastroenterology;;   POLYPECTOMY  08/29/2020   Procedure: POLYPECTOMY;  Surgeon: Eartha Flavors Toribio, MD;  [facility];  Service: Gastroenterology;;   POLYPECTOMY  07/24/2022   Procedure: POLYPECTOMY;  Surgeon: Eartha Flavors Toribio, MD;  [facility];  Service: Gastroenterology;;   [facility] REMOVAL N/A 01/06/2019   Procedure: REMOVAL PORT-A-CATH;  Surgeon: Gail Favorite, MD;  Location: [facility];  Service: General;  Laterality: N/A;   PORTACATH PLACEMENT Right 06/16/2018   Procedure: INSERTION PORT-A-CATH;  Surgeon: Gail Favorite, MD;  Location: [facility];  Service: General;  Laterality: Right;   RADIOLOGY WITH ANESTHESIA N/A 04/25/2021   Procedure: IR WITH ANESTHESIA EMBOLIZATION;  Surgeon: Dolphus Carrion, MD;  Location: [facility];  Service: Radiology;  Laterality: N/A;   SCLEROTHERAPY  07/24/2022   Procedure: SCLEROTHERAPY;  Surgeon: Eartha Flavors, Toribio, MD;  [facility];  Service: Gastroenterology;;   SUBMUCOSAL LIFTING INJECTION  08/29/2020    Procedure: SUBMUCOSAL LIFTING INJECTION;  Surgeon: Eartha Flavors, Toribio, MD;  [facility];  Service: Gastroenterology;;   SUBMUCOSAL LIFTING INJECTION  07/24/2022   Procedure: SUBMUCOSAL LIFTING INJECTION;  Surgeon: Eartha Flavors Toribio, MD;  [facility];  Service: Gastroenterology;;   TONSILLECTOMY  1970   Patient Active Problem List   Diagnosis Date Noted   Closed fracture of left fibula and tibia 02/02/2023   Acute diarrhea 12/23/2022   Functional dyspepsia 07/01/2022   DOE (dyspnea on exertion) 01/15/2022   Nausea with vomiting 05/28/2021   Duodenal adenoma 05/28/2021   Brain aneurysm 04/25/2021   History of herpes simplex infection 01/08/2021   Papanicolaou smear, as part of routine gynecological examination 01/08/2021   CVA (cerebral vascular accident) (HCC) 01/04/2021   Acute ischemic stroke (HCC) 01/03/2021   Hypokalemia 01/03/2021   Cerebral aneurysm 01/03/2021   Musculoskeletal pain 01/03/2021   Early satiety 11/27/2020   Abdominal discomfort 07/13/2020   Malignant neoplasm of upper-inner quadrant of left breast in female, estrogen receptor positive (HCC) 04/16/2018   Essential hypertension 03/21/2015  Hyperlipidemia 03/21/2015   Genital herpes 03/21/2015   Fecal urgency 03/21/2015   GERD (gastroesophageal reflux disease) 03/21/2015    ONSET DATE: ~3 years  REFERRING DIAG: Parkinson's Disease  THERAPY DIAG:  Other lack of coordination  Other symptoms and signs involving the nervous system  Rationale for Evaluation and Treatment: Rehabilitation  SUBJECTIVE:   SUBJECTIVE STATEMENT: I'm feeling alright right now.  Pt accompanied by: self  PERTINENT HISTORY: 74 y.o. female who is being followed for left-sided predominant Parkinson's disease. Initially seen by Dr. Buck 11/2019 for several year history of intermittent left hand tremors and exam showed evidence of mild left-sided parkinsonism. DaTscan  02/2020 supportive of  diagnosis. Hx of right corona radiata infarct 12/2020 with incidental finding of cerebral aneurysm on workup s/p endovascular treatment with flow diverter device 04/2021. Pt had OT in 12/2022 however then broke her leg and had to discharge and focus on PT. Pt now returning with worsening parkinson's symptons  PRECAUTIONS: None  WEIGHT BEARING RESTRICTIONS: No  PAIN:  Are you having pain? Yes: NPRS scale: 5/10 Pain location: back Pain description: aching Aggravating factors: standing Relieving factors: resting  FALLS: Has patient fallen in last 6 months? No  LIVING ENVIRONMENT: Lives with: lives with their family and lives with their son Lives in: House/apartment  PLOF: Independent  PATIENT GOALS: To improve control of LUE  OBJECTIVE:  Note: Objective measures were completed at Evaluation unless otherwise noted.  HAND DOMINANCE: Right  ADLs: Overall ADLs: Pt having difficulty with fine motor skills, unable to manipulate buttons, zippers, or clasps, as well as difficulty with bottles and cans requiring assist with most all ADL's and all IADL's.   MOBILITY STATUS: difficulty carrying objections with ambulation  POSTURE COMMENTS:  No Significant postural limitations Sitting balance: Moves/returns truncal midpoint >2 inches in all planes  ACTIVITY TOLERANCE: Activity tolerance: Pt fatigues quickly  UPPER EXTREMITY ROM:    BUE Full ROM  UPPER EXTREMITY MMT:     MMT Right eval Left eval  Shoulder flexion 4+/5 4+/5  Shoulder abduction 4/5 4+/5  Shoulder internal rotation 4+/5 4+/5  Shoulder external rotation 4/5 4/5  Elbow flexion 5/5 5/5  Elbow extension 5/5 5/5  Wrist flexion 5/5 4+/5  Wrist extension 5/5 4+/5  Wrist ulnar deviation 5/5 4+/5  Wrist radial deviation 5/5 5/5  Wrist pronation 5/5 4/5  Wrist supination 5/5 4+/5  (Blank rows = not tested)  HAND FUNCTION: Grip strength: Right: 54 lbs; Left: 46 lbs, Lateral pinch: Right: 13 lbs, Left: 12 lbs, and 3  point pinch: Right: 12 lbs, Left: 10 lbs  COORDINATION: 9 Hole Peg test: Right: 32.25 sec; Left: 26.21 sec  SENSATION: WFL  EDEMA: No swelling noted  COGNITION: Overall cognitive status: No family/caregiver present to determine baseline cognitive functioning  OBSERVATIONS: Mild constant tremor noted  TREATMENT DATE:   02/10/24 -Strengthening: 2#, protraction, flexion, abduction, horizontal abduction, er/IR, bicep curls, pronated curls, x12 -Arms on fire, 2# weights, x10 each - rest break after first 2 movements -Grooved Peg Board: 12 pegs BUE -Coins: picking up 10 coins and using in hand translation to place in piggy bank. RUE dropped 1, LUE dropped 4  02/03/24 -Boom whackers: 1 in each hand, reaching out of base of support to hit OT's boom whackers consistently, quickly, and in all different directions and heights -Scarves:  -pulling scarves out of cone 1 at a time and throwing over her shoulder  -tossing scarves up and catching them  -Theraputty: -using a built up fork and knife, cutting up a large chunk of putty to resemble steak -Roll putty into a ball, flatten into a pancake, roll into a log, tripod pinch, lateral pinch, roll into ball, squeeze -Hand writing: signing name, printing name, filling out envelope -UBE: level 2, 2.5' forwards and backwards  01/27/24 -Strengthening: 2#, shoulder flexion, shoulder abduction, protraction, horizontal abduction, er/IR, hammer curls, bicep curls, x15 -Wrist Strengthening: 2#, flexion, extension, ulnar/radial deviation, supination/pronation, x15 -Gripper: BUE 35# medium beads, 37# medium beads (pain in L thumb at this weight) -Grooved Peg Board: 12 pegs BUE -writing with a normal pen and a built up handle  12/31/23 -Provided information on the Cala watch for tremor relief -reviewed ADL's with extensive problems      PATIENT EDUCATION: Education details: Shoulder and It Sales Professional Person educated: Patient Education method: Explanation, Demonstration, and Handouts Education comprehension: verbalized understanding and returned demonstration  HOME EXERCISE PROGRAM: 12/10: Calla Watch 1/6: Shoulder and wrist strengthening   GOALS: Goals reviewed with patient? Yes  SHORT TERM GOALS: Target date: 02/27/24  Pt will be provided and educated on HEP for BUE in order to improve independence with all ADL's and IADL's.   Goal status: INITIAL  2.  Pt will increase BUE strength to 5/5 in order to lift and carry items needed for cleaning tasks.   Goal status: INITIAL  3.  Pt will increase BUE grip strength by 10# and pinch strength by 2# in order to grasp and hold items needed during meal prep.   Goal status: INITIAL  4.  Pt will increase BUE coordination by completing 9 hole peg test in 32 or less in order to manipulate buttons, zippers, and clasps.  Baseline:  Goal status: INITIAL  ASSESSMENT:  CLINICAL IMPRESSION: This session pt worked on her strength and coordination. Her tremor in her LUE appears less severe this session and she had min difficulty with all coordination tasks. She did fatigue quickly with strengthening exercises and required multiple rest breaks to ensure good form. Verbal and tactile cuing provided for positioning and technique throughout session.   PERFORMANCE DEFICITS: in functional skills including ADLs, IADLs, coordination, dexterity, tone, ROM, strength, pain, fascial restrictions, Fine motor control, Gross motor control, body mechanics, and UE functional use.   PLAN:  OT FREQUENCY: 1x/week  OT DURATION: 6 weeks  PLANNED INTERVENTIONS: 97168 OT Re-evaluation, 97535 self care/ADL training, 02889 therapeutic exercise, 97530 therapeutic activity, 97112 neuromuscular re-education, 97140 manual therapy, 97035 ultrasound, 97018 paraffin, 02989 moist heat, 97032  electrical stimulation (manual), passive range of motion, functional mobility training, energy conservation, coping strategies training, patient/family education, and DME and/or AE instructions  RECOMMENDED OTHER SERVICES: PT  CONSULTED AND AGREED WITH PLAN OF CARE: Patient  PLAN FOR NEXT SESSION: Stability tasks, strengthening, grip and pinch strengthening  Kamryn Gauthier Thelbert, OTR/L [facility]  [phone] Courtenay Hirth Jillyn Nightingale, OT 02/10/2024, 2:16 PM    "

## 2024-02-12 ENCOUNTER — Ambulatory Visit (HOSPITAL_COMMUNITY): Admitting: Speech Pathology

## 2024-02-12 ENCOUNTER — Encounter (HOSPITAL_COMMUNITY): Payer: Self-pay | Admitting: Speech Pathology

## 2024-02-12 DIAGNOSIS — R471 Dysarthria and anarthria: Secondary | ICD-10-CM

## 2024-02-12 NOTE — Therapy (Signed)
 " OUTPATIENT SPEECH LANGUAGE PATHOLOGY PARKINSON'S TREATMENT   Patient Name: Kimberly Mccormick MRN: [id number] DOB:05/15/1950, 74 y.o., female Today's Date: 02/12/2024  PCP: [name], MD REFERRING PROVIDER: Whitfield Raisin, NP  END OF SESSION:  End of Session - 02/12/24 1214     Visit Number 5    Number of Visits 7    Date for Recertification  02/26/24    Authorization Type [employer] Dual Complete    SLP Start Time 1205    SLP Stop Time  1250    SLP Time Calculation (min) 45 min    Activity Tolerance Patient tolerated treatment well          Past Medical History:  Diagnosis Date   Aneurysm of left internal carotid artery    Small supraclinoid 5 mm   Breast cancer (HCC)    Left s/p lumpectomy (05/06/2018) and adjuvant chemotherapy (06/23/2018) with Adriamycin and Cytoxan  x4 followed by Taxol weekly x12   DDD (degenerative disc disease), lumbar    Essential hypertension    Genital warts    GERD (gastroesophageal reflux disease)    History of anemia    History of renal insufficiency    Hypertension    Insomnia    Iron deficiency anemia    Ischemic stroke [facility])    December 2022   Mixed hyperlipidemia    Osteoarthritis    Parkinson's disease (HCC)    Peripheral neuropathy    Related to chemotherapy   Personal history of chemotherapy 2020   Port-A-Cath in place 06/23/2018   Past Surgical History:  Procedure Laterality Date   APPENDECTOMY  1966   BIOPSY  08/29/2020   Procedure: BIOPSY;  Surgeon: Eartha Angelia Sieving, MD;  [facility];  Service: Gastroenterology;;   BIOPSY  07/24/2022   Procedure: BIOPSY;  Surgeon: Eartha Angelia Sieving, MD;  [facility];  Service: Gastroenterology;;   BREAST BIOPSY Left 08/19/2023   US  LT BREAST BX W LOC DEV 1ST LESION IMG BX SPEC US  GUIDE 08/19/2023 [facility]-ULTRASOUND   BREAST LUMPECTOMY WITH RADIOACTIVE SEED AND SENTINEL LYMPH NODE BIOPSY Left 05/06/2018   Procedure: LEFT BREAST LUMPECTOMY WITH  RADIOACTIVE SEED AND LEFT DEEP AXILLARY SENTINEL LYMPH NODE BIOPSY AND BLUE DYE INJECTION;  Surgeon: Gail Favorite, MD;  Location:  SURGERY CENTER;  Service: General;  Laterality: Left;   CATARACT EXTRACTION W/PHACO Left 01/10/2014   Procedure: CATARACT EXTRACTION PHACO AND INTRAOCULAR LENS PLACEMENT ; CDE:  4.94;  Surgeon: Dow JULIANNA Burke, MD;  Location: [facility];  Service: Ophthalmology;  Laterality: Left;   CESAREAN SECTION  1986   CHOLECYSTECTOMY     COLONOSCOPY WITH PROPOFOL  N/A 07/25/2020   Procedure: COLONOSCOPY WITH PROPOFOL ;  Surgeon: Eartha Angelia Sieving, MD;  [facility];  Service: Gastroenterology;  Laterality: N/A;  10:55   ESOPHAGOGASTRODUODENOSCOPY N/A 12/30/2023   Procedure: EGD (ESOPHAGOGASTRODUODENOSCOPY);  Surgeon: Eartha Angelia, Sieving, MD;  [facility];  Service: Gastroenterology;  Laterality: N/A;  9:15 am, asa 3   ESOPHAGOGASTRODUODENOSCOPY (EGD) WITH PROPOFOL  N/A 08/29/2020   Procedure: ESOPHAGOGASTRODUODENOSCOPY (EGD) WITH PROPOFOL ;  Surgeon: Eartha Angelia Sieving, MD;  [facility];  Service: Gastroenterology;  Laterality: N/A;  12:00   ESOPHAGOGASTRODUODENOSCOPY (EGD) WITH PROPOFOL  N/A 07/24/2022   Procedure: ESOPHAGOGASTRODUODENOSCOPY (EGD) WITH PROPOFOL ;  Surgeon: Eartha Angelia Sieving, MD;  [facility];  Service: Gastroenterology;  Laterality: N/A;  12:45;ASA 1-2   HOT HEMOSTASIS  07/24/2022   Procedure: HOT HEMOSTASIS (ARGON PLASMA COAGULATION/BICAP);  Surgeon: Eartha  Angelia Sieving, MD;  [facility];  Service: Gastroenterology;;   IR 3D INDEPENDENT WKST  04/25/2021   IR ANGIO INTRA EXTRACRAN SEL COM CAROTID INNOMINATE UNI R MOD SED  04/25/2021   IR ANGIO INTRA EXTRACRAN SEL INTERNAL CAROTID UNI L MOD SED  04/25/2021   IR ANGIOGRAM FOLLOW UP STUDY  04/25/2021   IR CT HEAD LTD  04/25/2021   IR NEURO EACH ADD'L AFTER BASIC UNI LEFT (MS)  04/25/2021   IR RADIOLOGIST EVAL & MGMT  03/22/2021   IR  RADIOLOGIST EVAL & MGMT  03/28/2021   IR RADIOLOGIST EVAL & MGMT  05/15/2021   IR RADIOLOGIST EVAL & MGMT  09/28/2021   IR TRANSCATH/EMBOLIZ  04/25/2021   IR US  GUIDE VASC ACCESS RIGHT  04/25/2021   MYRINGOTOMY WITH TUBE PLACEMENT Right 02/04/2017   Procedure: REVISION OF RIGHT MYRINGOTOMY WITH TUBE PLACEMENT, WITH EXAM OF LEFT EAR;  Surgeon: Karis Clunes, MD;  Location: [facility];  Service: ENT;  Laterality: Right;   NASAL SINUS SURGERY  2016   polypectomy   OPEN REDUCTION INTERNAL FIXATION (ORIF) TIBIA/FIBULA FRACTURE Left 02/02/2023   Procedure: OPEN REDUCTION INTERNAL FIXATION (ORIF) LEFT TIBIA/FIBULA FRACTURE;  Surgeon: Georgina Ozell LABOR, MD;  Location: [facility];  Service: Orthopedics;  Laterality: Left;   POLYPECTOMY  07/25/2020   Procedure: POLYPECTOMY;  Surgeon: Eartha Angelia Sieving, MD;  [facility];  Service: Gastroenterology;;   POLYPECTOMY  08/29/2020   Procedure: POLYPECTOMY;  Surgeon: Eartha Angelia Sieving, MD;  [facility];  Service: Gastroenterology;;   POLYPECTOMY  07/24/2022   Procedure: POLYPECTOMY;  Surgeon: Eartha Angelia Sieving, MD;  [facility];  Service: Gastroenterology;;   [facility] REMOVAL N/A 01/06/2019   Procedure: REMOVAL PORT-A-CATH;  Surgeon: Gail Favorite, MD;  Location: [facility];  Service: General;  Laterality: N/A;   PORTACATH PLACEMENT Right 06/16/2018   Procedure: INSERTION PORT-A-CATH;  Surgeon: Gail Favorite, MD;  Location: [facility];  Service: General;  Laterality: Right;   RADIOLOGY WITH ANESTHESIA N/A 04/25/2021   Procedure: IR WITH ANESTHESIA EMBOLIZATION;  Surgeon: Dolphus Carrion, MD;  Location: [facility];  Service: Radiology;  Laterality: N/A;   SCLEROTHERAPY  07/24/2022   Procedure: SCLEROTHERAPY;  Surgeon: Eartha Angelia, Sieving, MD;  [facility];  Service: Gastroenterology;;   SUBMUCOSAL LIFTING INJECTION  08/29/2020   Procedure: SUBMUCOSAL LIFTING  INJECTION;  Surgeon: Eartha Angelia, Sieving, MD;  [facility];  Service: Gastroenterology;;   SUBMUCOSAL LIFTING INJECTION  07/24/2022   Procedure: SUBMUCOSAL LIFTING INJECTION;  Surgeon: Eartha Angelia Sieving, MD;  [facility];  Service: Gastroenterology;;   TONSILLECTOMY  1970   Patient Active Problem List   Diagnosis Date Noted   Closed fracture of left fibula and tibia 02/02/2023   Acute diarrhea 12/23/2022   Functional dyspepsia 07/01/2022   DOE (dyspnea on exertion) 01/15/2022   Nausea with vomiting 05/28/2021   Duodenal adenoma 05/28/2021   Brain aneurysm 04/25/2021   History of herpes simplex infection 01/08/2021   Papanicolaou smear, as part of routine gynecological examination 01/08/2021   CVA (cerebral vascular accident) (HCC) 01/04/2021   Acute ischemic stroke (HCC) 01/03/2021   Hypokalemia 01/03/2021   Cerebral aneurysm 01/03/2021   Musculoskeletal pain 01/03/2021   Early satiety 11/27/2020   Abdominal discomfort 07/13/2020   Malignant neoplasm of upper-inner quadrant of left breast in female, estrogen receptor positive (HCC) 04/16/2018   Essential hypertension 03/21/2015   Hyperlipidemia 03/21/2015   Genital herpes  03/21/2015   Fecal urgency 03/21/2015   GERD (gastroesophageal reflux disease) 03/21/2015    ONSET DATE: ~2021  REFERRING DIAG: G20.A1 (ICD-10-CM) - Parkinson's disease without dyskinesia or fluctuating manifestations (HCC)  THERAPY DIAG:  Dysarthria  Rationale for Evaluation and Treatment: Rehabilitation  SUBJECTIVE:   SUBJECTIVE STATEMENT: I hope we don't get snow next weekend.  Pt accompanied by: self  PERTINENT HISTORY: DAMIEN CISAR is a 74 y.o. female who is referred by Harlene Bogaert, NP (neurology) for SLP evaluation and treatment in setting of left-sided predominant Parkinson's disease.  Initially seen by Dr. Buck 11/2019 for several year history of intermittent left hand tremors and exam showed evidence  of mild left-sided parkinsonism.  DaTscan  02/2020 supportive of diagnosis. Hx of right corona radiata infarct 12/2020 with incidental finding of cerebral aneurysm on workup s/p endovascular treatment with flow diverter device 04/2021 by Dr. Dolphus. She was evaluated and seen for one treatment session for SLP a year ago, but had to cancel her appointments due to a fall on ice.  PAIN:  Are you having pain? No  FALLS: Has patient fallen in last 6 months?  No  LIVING ENVIRONMENT: Lives with: lives with their family and lives with their son Lives in: House/apartment  PLOF:  Level of assistance: Independent with ADLs, Independent with IADLs Employment: Retired  PATIENT GOALS: Increase loudness for speech  OBJECTIVE:  Note: Objective measures were completed at Evaluation unless otherwise noted.  DIAGNOSTIC FINDINGS:  04/15/2022 MRI HEAD FINDINGS IMPRESSION: 1. No acute brain finding. Moderate chronic small-vessel ischemic changes of the cerebral hemispheric white matter. 2. Left internal carotid artery flow diverting stent. No residual visualized flow in the region of the medially directed aneurysm. 3. 4 mm aneurysm projecting laterally from the petrous carotid on the left. No change.   02/23/2020 NUCLEAR MEDICINE BRAIN IMAGING WITH SPECT (DaTscan  ) IMPRESSION: Significant decreased striatal Ioflupane activity most prominent in the posterior striatum. This pattern can be seen in Parkinsonian syndromes.  COGNITION: Overall cognitive status: Impaired: Areas of impairment:  Memory: Impaired: Working Teacher, Music term  Areas of impairment: Memory Comments: 20/30 on the [facility] SLUMS  MOTOR SPEECH: Overall motor speech: impaired Level of impairment: Conversation Respiration: diaphragmatic/abdominal breathing Phonation: normal and mild glottal fry Resonance: WFL Articulation: Appears intact Intelligibility: Intelligible Motor planning: Appears intact Motor speech errors: N/A Interfering  components: fluctuation throughout the day likely Effective technique: increased vocal intensity  ORAL MOTOR EXAMINATION: Overall status: WFL Comments: N/A and Pt reports no current dysphagia symptoms  OBJECTIVE VOICE ASSESSMENT: Sustained ah maximum phonation time: 17.7 seconds Sustained ah loudness average: 92 dB Oral reading (passage) loudness average: 75 dB Conversational loudness average: 70 dB Voice quality: occasional glottal fry observed Stimulability trials: Given SLP modeling and occasional min cues, loudness average increased to 75dB at conversation level.  Comments: Pt automatically increased vocal intensity during recorded and microphone displayed tasks  Completed audio recording of patients baseline voice without cueing from SLP: Yes  Pt does not report difficulty with swallowing which does not warrant further evaluation.  Previous Treatment: Pt brought her SPEAKOUT! Therapy workbook today. SLP provided modeling of each exercise and asked Pt to return demonstrate to ensure accuracy. She benefited from mi/mod cues for smooth and continuous to assist with breath support. Pt with occasional glottal fry observed on /a/ glides downward and cued to stop if she hears it. She demonstrated good intent in all structured tasks, but was noted to decrease slightly in spontaneous conversation. SLP provided min prompts when she shared about her upcoming birthday celebration to increase intent with good results. She also sang what she plans to sing in her church this Sunday and appeared pleased when SLP shared that I could understand every word. SLP showed her the first few minutes of the What is Parkinson's video and texted her the link to review at home (she does not have email). Continue with Speakout! Workbook 2x/day.  Previous Treatment: Pt admits to not practicing  exercises yesterday due to fatigue. In session, we used her phone to record samples of our exercises so she can use at home as needed. Pt completed Lesson 2 of Speakout! Exercises with mi/mod assist from SLP. She exhibits glottal fry at times and a low vocal register. SLP provided audio recording for feeback and cued to breathe from her belly (analogy of a round barrel). SLP also provided modeling and written cues to add other easy onset voicing techniques to eliminate glottal fry (yaya, hello 1, num num, haa) which was met with modest success. Pt most successful with Hello 1, hello 2...). Pt also given a communication template to add relevant vocabulary so she can practice reading aloud at home (restaurants and food items). Pt states that she would like to cook more at home instead of ordering out so we created a list of food items she would like to get from [employer]. Continue to target goals.  Previous Treatment: Pt was excited to share that she sang a solo in church with use of a microphone and that she received a lot of positive feedback. SLP encouraged her to continue singing with the choir and at home on her own to practice as an extension of SPEAKOUT! Reduction in glottal fry noted today. She started to doze off some in our session due to this being her third treatment session of the day, but participated in [facility]! Lesson 4. She benefits from SLP modeling to begin each exercise, despite having an audio recording we made to use at home. She was also shown how to access the [place] from her phone to complete exercises with the examples they have. She required mi/mod cues for the first 5 exercises and reduced cues for counting, oral reading, and cognitive communication task. Pt encouraged to continue with twice daily exercises going forward.  TREATMENT DATE: 02/12/24 Pt completed lesson 5 in session with initial and model from SLP for the m-cycle and glides for smooth and  continuous. Improvement in glides noted today for pitch changes. Pt also with notable increased vocal intensity across all tasks (seen at noon today and did not have other therapies beforehand). She completed the entirety of lesson 5 and then provided verbal summary of how to make sloppy Joe's and then a summary of her birthday celebration planned for next week. Pt encouraged to continue with twice daily exercises and she said she plans to do so. Minimal glottal fry exhibited this date. Continue plan of care.   PATIENT EDUCATION: Education details: SPEAKOUT! Therapy lesson 5+ Person educated: Patient Education method: Explanation  and Demonstration Education comprehension: verbalized understanding  HOME EXERCISE PROGRAM: Pt will completed HEP as assigned to facilitate carryover of treatment strategies and techniques in home and community environment with written cues.  GOALS: Goals reviewed with patient? Yes   SHORT TERM GOALS: Target date: 02/26/2024   Pt will coordinate vocal and articulatory subsystems in hierarchical speech tasks by producing sounds with intention with min assistance. Baseline: introduced this date, min/mod Goal status: ONGOING   2.  Generalize intentional speech to cognitive-linguistic exercises and conversational speech with improved vocal quality, loudness, articulatory precision, and endurance while maintaining a minimum of 85 dB with min assistance. Baseline: 68 dB Goal status: ONGOING   3.  Read phrases, sentences, and paragraphs with intention, yielding improved vocal quality, loudness, articulatory precision, and endurance while maintaining a minimum of 85 dB with min assistance. Baseline: 75 dB Goal status: ONGOING   LONG TERM GOALS: Target date: 02/2024   The patient will increase intensity in conversational speech allowing them to converse in the community, increase voice use, slow progression of vocal deterioration, and improve their QOL with indirect cues  prn. Baseline: min/mod cues Goal status: ONGOING  ASSESSMENT:  CLINICAL IMPRESSION: (from initial evaluation 12/23/225) Patient is a 74 y.o. female who was seen today for a cognitive linguistic evaluation in setting of Parkinson's disease. Pt presents with mild cognitive linguistic deficits ([facility] SLUMS 20/30) and mild hypokinetic dysarthria characterized by attention and working memory deficits, hoarse/strained vocal quality with glottal fry and fluctuations in loudness. Pt reports feeling as if she is not talking loud enough and has to make an effort to speak louder. While she sustained /a/ for 18 seconds at a level of 92 dB, her volume in conversation was sub optimal at ~68 dB. Pt counted from 1-10 with average of 68 dB and when cued for increased vocal intensity and intent, this improved to 82 dB. Pt is motivated to improve her speech and showed improvement when cues provided by SLP. Recommend SPEAKOUT! Therapy for 6+ sessions and plan for discharge to a group setting to continue and maintain gains. Pt is in agreement with plan of care.  OBJECTIVE IMPAIRMENTS: Objective impairments include memory and dysarthria. These impairments are limiting patient from effectively communicating at home and in community.Factors affecting potential to achieve goals and functional outcome are previous level of function.. Patient will benefit from skilled SLP services to address above impairments and improve overall function.  REHAB POTENTIAL: Excellent  PLAN:  SLP FREQUENCY: 1-2x/week  SLP DURATION: 4 weeks  PLANNED INTERVENTIONS: Cueing hierachy, Internal/external aids, Multimodal communication approach, SLP instruction and feedback, Compensatory strategies, Patient/family education, [phone] Treatment of speech (30 or 45 min) , and [id number]- Speech [address] Artic, Phon, Eval Compre, Express   Thank you,  Lamar Candy, CCC-SLP [phone]  Cassell Voorhies, CCC-SLP 02/12/2024, 12:18 PM      "

## 2024-02-16 ENCOUNTER — Ambulatory Visit (HOSPITAL_COMMUNITY): Admitting: Speech Pathology

## 2024-02-17 ENCOUNTER — Ambulatory Visit (HOSPITAL_COMMUNITY): Admitting: Occupational Therapy

## 2024-02-17 ENCOUNTER — Ambulatory Visit (HOSPITAL_COMMUNITY)

## 2024-02-19 ENCOUNTER — Ambulatory Visit (HOSPITAL_COMMUNITY)
Admission: RE | Admit: 2024-02-19 | Discharge: 2024-02-19 | Disposition: A | Discharge status: Home / Self Care | Source: Ambulatory Visit | Attending: Adult Health | Admitting: Adult Health

## 2024-02-19 ENCOUNTER — Ambulatory Visit (HOSPITAL_COMMUNITY): Admitting: Physical Therapy

## 2024-02-19 ENCOUNTER — Ambulatory Visit (HOSPITAL_COMMUNITY): Admitting: Speech Pathology

## 2024-02-19 ENCOUNTER — Ambulatory Visit: Payer: Self-pay | Admitting: Adult Health

## 2024-02-19 DIAGNOSIS — I671 Cerebral aneurysm, nonruptured: Secondary | ICD-10-CM | POA: Diagnosis present

## 2024-02-19 DIAGNOSIS — Z95828 Presence of other vascular implants and grafts: Secondary | ICD-10-CM | POA: Diagnosis not present

## 2024-02-19 DIAGNOSIS — I6603 Occlusion and stenosis of bilateral middle cerebral arteries: Secondary | ICD-10-CM | POA: Diagnosis not present

## 2024-02-19 MED ORDER — IOHEXOL 350 MG/ML SOLN
75.0000 mL | Freq: Once | INTRAVENOUS | Status: AC | PRN
  Administered 2024-02-19: 75 mL via INTRAVENOUS

## 2024-02-23 ENCOUNTER — Ambulatory Visit (HOSPITAL_COMMUNITY)

## 2024-02-24 ENCOUNTER — Encounter (HOSPITAL_COMMUNITY): Payer: Self-pay | Admitting: Speech Pathology

## 2024-02-24 ENCOUNTER — Ambulatory Visit (HOSPITAL_COMMUNITY): Attending: Adult Health | Admitting: Speech Pathology

## 2024-02-24 DIAGNOSIS — R471 Dysarthria and anarthria: Secondary | ICD-10-CM

## 2024-02-25 ENCOUNTER — Other Ambulatory Visit

## 2024-02-25 ENCOUNTER — Ambulatory Visit: Admitting: Orthopedic Surgery

## 2024-02-25 DIAGNOSIS — S82402D Unspecified fracture of shaft of left fibula, subsequent encounter for closed fracture with routine healing: Secondary | ICD-10-CM

## 2024-02-25 DIAGNOSIS — S82202D Unspecified fracture of shaft of left tibia, subsequent encounter for closed fracture with routine healing: Secondary | ICD-10-CM

## 2024-02-25 NOTE — Progress Notes (Signed)
 Orthopedic Surgery Post-operative Office Visit   Procedure: left distal tibia and fibula fractures open reduction internal fixation Date of Surgery: 02/02/2023 (~1 year post-op)   Assessment: Patient is a 74 y.o. who is doing well. Not having any pain in her leg or ankle      Plan: -No operative plans at this time -Weightbearing as tolerated in regular shoes -DVT ppx: none -Okay to submerge wounds -Return to office on an as needed basis   ___________________________________________________________________________     Subjective: Patient is not having any consistent pain in her leg at this time. She said she will sometimes notice pain over the medial malleolus if she hits it otherwise she does not have any pain in the ankle. She is not taking any medications for pain. She is ambulating with a cane. She has no complaints at this time.    Objective:   General: no acute distress, appropriate affect Neurologic: alert, answering questions appropriately, following commands Respiratory: unlabored breathing on room air Skin: incisions are well healed   MSK (LLE): no tenderness to palpation over the foot or ankle, ambulates in regular shoes with cane, ankle range of motion from 0-30, sensation intact to light touch in sural/saphenous/deep peroneal/superficial peroneal/tibial nerve distributions, foot warm and well-perfused, palpable DP pulse, EHL/TA/GSC intact    Imaging: XRs of the left tibia from 02/29/2024 were independently reviewed and interpreted, showing persistent fracture line both medially and laterally but centrally there does appear to be bridging bone.  The fibula fracture has bridging bone across it.  Bridging bone is seen anteriorly and centrally on the [facility] view at the tibia. Subtle lucency at the distal locking screws in the tibia that appears unchanged from prior films. No lucency seen at remaining screws. No new fracture seen. No dislocation seen.       Patient name: Kimberly Mccormick Patient MRN: [id number] Date of visit: 02/25/24   Pre-operative Scores   VAS leg: 9/10    6 Month Post-operative Scores   VAS leg: 3/10  1 Year Post-operative Scores   VAS leg: 1/10

## 2024-02-27 ENCOUNTER — Ambulatory Visit (HOSPITAL_COMMUNITY)

## 2024-02-27 ENCOUNTER — Encounter (HOSPITAL_COMMUNITY): Payer: Self-pay

## 2024-02-27 DIAGNOSIS — R2689 Other abnormalities of gait and mobility: Secondary | ICD-10-CM

## 2024-02-27 DIAGNOSIS — M6281 Muscle weakness (generalized): Secondary | ICD-10-CM

## 2024-02-27 NOTE — Therapy (Signed)
 " OUTPATIENT PHYSICAL THERAPY NEURO TREATMENT   Patient Name: Kimberly Mccormick MRN: [id number] DOB:Oct 12, 1950, 74 y.o., female Today's Date: 02/27/2024   PCP: [name], MD  REFERRING PROVIDER: Whitfield Raisin, NP   END OF SESSION:  PT End of Session - 02/27/24 1416     Visit Number 4    Number of Visits 8    Date for Recertification  03/19/24    Authorization Type [employer]    Authorization Time Period no authorization required    PT Start Time 1415    PT Stop Time 1457    PT Time Calculation (min) 42 min    Activity Tolerance Patient tolerated treatment well    Behavior During Therapy WFL for tasks assessed/performed            Past Medical History:  Diagnosis Date   Aneurysm of left internal carotid artery    Small supraclinoid 5 mm   Breast cancer (HCC)    Left s/p lumpectomy (05/06/2018) and adjuvant chemotherapy (06/23/2018) with Adriamycin and Cytoxan  x4 followed by Taxol weekly x12   DDD (degenerative disc disease), lumbar    Essential hypertension    Genital warts    GERD (gastroesophageal reflux disease)    History of anemia    History of renal insufficiency    Hypertension    Insomnia    Iron deficiency anemia    Ischemic stroke [facility])    December 2022   Mixed hyperlipidemia    Osteoarthritis    Parkinson's disease (HCC)    Peripheral neuropathy    Related to chemotherapy   Personal history of chemotherapy 2020   Port-A-Cath in place 06/23/2018   Past Surgical History:  Procedure Laterality Date   APPENDECTOMY  1966   BIOPSY  08/29/2020   Procedure: BIOPSY;  Surgeon: Eartha Angelia Sieving, MD;  [facility];  Service: Gastroenterology;;   BIOPSY  07/24/2022   Procedure: BIOPSY;  Surgeon: Eartha Angelia Sieving, MD;  [facility];  Service: Gastroenterology;;   BREAST BIOPSY Left 08/19/2023   US  LT BREAST BX W LOC DEV 1ST LESION IMG BX SPEC US  GUIDE 08/19/2023 [facility]-ULTRASOUND   BREAST LUMPECTOMY WITH RADIOACTIVE  SEED AND SENTINEL LYMPH NODE BIOPSY Left 05/06/2018   Procedure: LEFT BREAST LUMPECTOMY WITH RADIOACTIVE SEED AND LEFT DEEP AXILLARY SENTINEL LYMPH NODE BIOPSY AND BLUE DYE INJECTION;  Surgeon: Gail Favorite, MD;  Location: [facility];  Service: General;  Laterality: Left;   CATARACT EXTRACTION W/PHACO Left 01/10/2014   Procedure: CATARACT EXTRACTION PHACO AND INTRAOCULAR LENS PLACEMENT ; CDE:  4.94;  Surgeon: Dow JULIANNA Burke, MD;  [facility]: [facility];  Service: Ophthalmology;  Laterality: Left;   CESAREAN SECTION  1986   CHOLECYSTECTOMY     COLONOSCOPY WITH PROPOFOL  N/A 07/25/2020   Procedure: COLONOSCOPY WITH PROPOFOL ;  Surgeon: Eartha Angelia Sieving, MD;  [facility];  Service: Gastroenterology;  Laterality: N/A;  10:55   ESOPHAGOGASTRODUODENOSCOPY N/A 12/30/2023   Procedure: EGD (ESOPHAGOGASTRODUODENOSCOPY);  Surgeon: Eartha Angelia, Sieving, MD;  [facility];  Service: Gastroenterology;  Laterality: N/A;  9:15 am, asa 3   ESOPHAGOGASTRODUODENOSCOPY (EGD) WITH PROPOFOL  N/A 08/29/2020   Procedure: ESOPHAGOGASTRODUODENOSCOPY (EGD) WITH PROPOFOL ;  Surgeon: Eartha Angelia Sieving, MD;  [facility];  Service: Gastroenterology;  Laterality: N/A;  12:00   ESOPHAGOGASTRODUODENOSCOPY (EGD) WITH PROPOFOL  N/A 07/24/2022   Procedure: ESOPHAGOGASTRODUODENOSCOPY (EGD) WITH PROPOFOL ;  Surgeon: Eartha Angelia Sieving, MD;  [facility];  Service: Gastroenterology;  Laterality:  N/A;  12:45;ASA 1-2   HOT HEMOSTASIS  07/24/2022   Procedure: HOT HEMOSTASIS (ARGON PLASMA COAGULATION/BICAP);  Surgeon: Eartha Flavors, Toribio, MD;  [facility];  Service: Gastroenterology;;   IR 3D INDEPENDENT WKST  04/25/2021   IR ANGIO INTRA EXTRACRAN SEL COM CAROTID INNOMINATE UNI R MOD SED  04/25/2021   IR ANGIO INTRA EXTRACRAN SEL INTERNAL CAROTID UNI L MOD SED  04/25/2021   IR ANGIOGRAM FOLLOW UP STUDY  04/25/2021   IR CT HEAD LTD  04/25/2021   IR NEURO EACH  ADD'L AFTER BASIC UNI LEFT (MS)  04/25/2021   IR RADIOLOGIST EVAL & MGMT  03/22/2021   IR RADIOLOGIST EVAL & MGMT  03/28/2021   IR RADIOLOGIST EVAL & MGMT  05/15/2021   IR RADIOLOGIST EVAL & MGMT  09/28/2021   IR TRANSCATH/EMBOLIZ  04/25/2021   IR US  GUIDE VASC ACCESS RIGHT  04/25/2021   MYRINGOTOMY WITH TUBE PLACEMENT Right 02/04/2017   Procedure: REVISION OF RIGHT MYRINGOTOMY WITH TUBE PLACEMENT, WITH EXAM OF LEFT EAR;  Surgeon: Karis Clunes, MD;  Location: [facility];  Service: ENT;  Laterality: Right;   NASAL SINUS SURGERY  2016   polypectomy   OPEN REDUCTION INTERNAL FIXATION (ORIF) TIBIA/FIBULA FRACTURE Left 02/02/2023   Procedure: OPEN REDUCTION INTERNAL FIXATION (ORIF) LEFT TIBIA/FIBULA FRACTURE;  Surgeon: Georgina Ozell LABOR, MD;  Location: [facility];  Service: Orthopedics;  Laterality: Left;   POLYPECTOMY  07/25/2020   Procedure: POLYPECTOMY;  Surgeon: Eartha Flavors Toribio, MD;  [facility];  Service: Gastroenterology;;   POLYPECTOMY  08/29/2020   Procedure: POLYPECTOMY;  Surgeon: Eartha Flavors Toribio, MD;  [facility];  Service: Gastroenterology;;   POLYPECTOMY  07/24/2022   Procedure: POLYPECTOMY;  Surgeon: Eartha Flavors Toribio, MD;  [facility];  Service: Gastroenterology;;   [facility] REMOVAL N/A 01/06/2019   Procedure: REMOVAL PORT-A-CATH;  Surgeon: Gail Favorite, MD;  Location: [facility];  Service: General;  Laterality: N/A;   PORTACATH PLACEMENT Right 06/16/2018   Procedure: INSERTION PORT-A-CATH;  Surgeon: Gail Favorite, MD;  Location: [facility];  Service: General;  Laterality: Right;   RADIOLOGY WITH ANESTHESIA N/A 04/25/2021   Procedure: IR WITH ANESTHESIA EMBOLIZATION;  Surgeon: Dolphus Carrion, MD;  Location: [facility];  Service: Radiology;  Laterality: N/A;   SCLEROTHERAPY  07/24/2022   Procedure: SCLEROTHERAPY;  Surgeon: Eartha Flavors, Toribio, MD;  [facility];  Service:  Gastroenterology;;   SUBMUCOSAL LIFTING INJECTION  08/29/2020   Procedure: SUBMUCOSAL LIFTING INJECTION;  Surgeon: Eartha Flavors, Toribio, MD;  [facility];  Service: Gastroenterology;;   SUBMUCOSAL LIFTING INJECTION  07/24/2022   Procedure: SUBMUCOSAL LIFTING INJECTION;  Surgeon: Eartha Flavors Toribio, MD;  [facility];  Service: Gastroenterology;;   TONSILLECTOMY  1970   Patient Active Problem List   Diagnosis Date Noted   Closed fracture of left fibula and tibia 02/02/2023   Acute diarrhea 12/23/2022   Functional dyspepsia 07/01/2022   DOE (dyspnea on exertion) 01/15/2022   Nausea with vomiting 05/28/2021   Duodenal adenoma 05/28/2021   Brain aneurysm 04/25/2021   History of herpes simplex infection 01/08/2021   Papanicolaou smear, as part of routine gynecological examination 01/08/2021   CVA (cerebral vascular accident) (HCC) 01/04/2021   Acute ischemic stroke (HCC) 01/03/2021   Hypokalemia 01/03/2021   Cerebral aneurysm 01/03/2021   Musculoskeletal pain 01/03/2021   Early satiety 11/27/2020   Abdominal discomfort 07/13/2020   Malignant neoplasm of upper-inner quadrant of left  breast in female, estrogen receptor positive (HCC) 04/16/2018   Essential hypertension 03/21/2015   Hyperlipidemia 03/21/2015   Genital herpes 03/21/2015   Fecal urgency 03/21/2015   GERD (gastroesophageal reflux disease) 03/21/2015    ONSET DATE: years  REFERRING DIAG: Parkinson's disease without dyskinesia or fluctuating manifestations   THERAPY DIAG:  Other abnormalities of gait and mobility  Muscle weakness (generalized)  Rationale for Evaluation and Treatment: Rehabilitation  SUBJECTIVE:                                                                                                                                                                                             SUBJECTIVE STATEMENT: Patient reports that she feels tired today from doing a lot this  morning. She is doing her HEP and it makes her tired.   Eval:  Patient reports that she has been having trouble getting around for a while now. She notes that it has been getting worse recently. She has not fallen since January 2025. She mainly uses a cane to help her get around. Her son has to help her some clean up around the house as she has trouble holding onto things.  Pt accompanied by: self  PERTINENT HISTORY: Osteoarthritis, Parkinson's disease, history of CVA, peripheral neuropathy, hypertension, and history of cancer  PAIN:  Are you having pain? No  PRECAUTIONS: None  RED FLAGS: None   WEIGHT BEARING RESTRICTIONS: No  FALLS: Has patient fallen in last 6 months? No  LIVING ENVIRONMENT: Lives with: lives with their son Lives in: House/apartment Stairs: Yes: External: 3-4 steps; on left going up Has following equipment at home: Single point cane and Walker - 2 wheeled  PLOF: Independent with basic ADLs  PATIENT GOALS: improved mobility  OBJECTIVE:  Note: Objective measures were completed at Evaluation unless otherwise noted.  COGNITION: Overall cognitive status: Within functional limits for tasks assessed  COORDINATION: Heel to shin test: difficulty with touching heel to the opposite shin; partial ROM Left hand: resting tremor  LOWER EXTREMITY MMT:    MMT Right Eval Left Eval  Hip flexion 4-/5 4-/5  Hip extension    Hip abduction    Hip adduction    Hip internal rotation    Hip external rotation    Knee flexion 4-/5 4/5  Knee extension 4-/5 4-/5  Ankle dorsiflexion 3+/5 3+/5  Ankle plantarflexion    Ankle inversion    Ankle eversion    (Blank rows = not tested)  GAIT: Findings: Gait Characteristics: step through pattern, decreased arm swing- Right, decreased arm swing- Left, decreased stride length, Right foot flat, Left foot flat, shuffling, poor foot  clearance- Right, and poor foot clearance- Left, Distance walked: 50 feet, Assistive device  utilized:Single point cane, and Level of assistance: SBA  FUNCTIONAL TESTS:  5 times sit to stand: 26.98 seconds Timed up and go (TUG): 36.19 seconds without AD  2 minute walk test: 02/03/24:2MWT 234ft no AD, shuffled gait mechanics, no LOB  PATIENT SURVEYS:  LEFS  Extreme difficulty/unable (0), Quite a bit of difficulty (1), Moderate difficulty (2), Little difficulty (3), No difficulty (4) Survey date:  01/29/24  Any of your usual work, housework or school activities 1  2. Usual hobbies, recreational or sporting activities 2  3. Getting into/out of the bath 3  4. Walking between rooms 4  5. Putting on socks/shoes 1  6. Squatting  2  7. Lifting an object, like a bag of groceries from the floor 2  8. Performing light activities around your home 4  9. Performing heavy activities around your home 0  10. Getting into/out of a car 2  11. Walking 2 blocks 0  12. Walking 1 mile 0  13. Going up/down 10 stairs (1 flight) 3  14. Standing for 1 hour 0  15.  sitting for 1 hour 4  16. Running on even ground 0  17. Running on uneven ground 0  18. Making sharp turns while running fast 0  19. Hopping  0  20. Rolling over in bed 2  Score total:  30/80                                                                                                                                 TREATMENT DATE:                                     02/27/24 EXERCISE LOG  Exercise Repetitions and Resistance Comments  Standing cone taps  2 minutes  Alternating LE; 1 HHA from parallel bars; 3 cones (forward only)   Stepping over hurdles  5 laps in parallel bars  2 low and 1 high hurdles; BUE support from parallel bars   Marching on foam  2 minutes  BUE support from parallel bars   LAQ  3# x 15 reps each    Seated step out   3# x 20 reps each    Standing heel raise   20 reps  BUE support from parallel bars   Standing HS curl   15 reps each  BUE support from parallel bars   Seated hip ADD isometric  2 minutes w/ 5  second hold    Sit to stand  16 reps  Without UE support   Nustep  L4 x 5 minutes     Blank cell = exercise not performed today  02/10/24 EXERCISE LOG  Exercise Repetitions and Resistance Comments  Rocker board  2 minutes    Standing cone tap   2 minutes  Forward; 2 HHA progressing to intermittent UE support   Stepping backward  2 minutes     LAQ  3# x 15 reps each    Standing march  3# x 2 minutes  BUE support from parallel bars   Static stance on foam  2 minutes NBOS  Standing on foam  2.5 minutes   Hitting moving boom sticks   Squatting  15 reps   Nustep  L4 x 5 minutes BUE and BLE   Sit to stand  14 reps  Without UE support    Blank cell = exercise not performed today   02/03/24: Reviewed goals 223ft no AD, shuffled gait mechanics, no LOB Standing:  PWR up with weighted red ball 10  PWR Reach 10x  PWR twist 10x  Marching 10x  Marching with opposite UE/LE 10x 3 cueing for sequence  Lateral toe tapping between 6in step alternating  Heel raise  Toe raise Nustep UE/LE L3 resistance x , SPM goal >65  Seated: Heel Toe Raises 10 LAQ 10  PATIENT EDUCATION: Education details: HEP, plan of care, objective findings, and goals for physical therapy Person educated: Patient Education method: Explanation, Demonstration, and Handouts Education comprehension: verbalized understanding and returned demonstration  HOME EXERCISE PROGRAM: Access Code: [email or web address] URL: [email or web address] Date: 01/29/2024 Prepared by: Lacinda Fass  Exercises - Sit to Stand  - 1 x daily - 7 x weekly - 3 sets - 5 reps - Seated March  - 1 x daily - 7 x weekly - 3 sets - 10 reps - Seated Heel Toe Raises  - 1 x daily - 7 x weekly - 3 sets - 10 reps  GOALS: Goals reviewed with patient? Yes  LONG TERM GOALS: Target date: 02/26/24  Patient will be independent with her HEP. Baseline:  Goal status: INITIAL  2.  Patient will improve her LEFS  score to at least 40/80 for improved perceived function with her daily activities. Baseline:  Goal status: INITIAL  3.  Patient will improve her 5 times sit to stand time to 15 seconds or less to reduce her fall risk. Baseline:  Goal status: INITIAL  4.  Patient will improve her timed up and go time to 20 seconds or less for improved functional mobility. Baseline:  Goal status: INITIAL  ASSESSMENT:  CLINICAL IMPRESSION: Patient was progressed with new and familiar interventions for improved muscular strength and endurance. She required minimal cueing with today's standing interventions for upright stance to improve her awareness of her surroundings. She required occasional seated rest breaks throughout treatment due to increased lower extremity fatigue. She reported feeling tired upon the conclusion of treatment. Patient continues to require skilled physical therapy to address their remaining impairments to return to her prior level of function.    Eval:  Patient is a 75 y.o. female who was seen today for physical therapy evaluation and treatment for unsteadiness on her feet and gait deviations secondary to Parkinson's disease.  She has a high fall risk as evidenced by her objective testing and gait mechanics.  She was provided a home exercise program which she was able to properly demonstrate.  Recommend that she continue with skilled physical therapy to address her impairments to maximize her safety and functional mobility.  OBJECTIVE IMPAIRMENTS: Abnormal gait, decreased activity tolerance, decreased balance, decreased coordination, decreased mobility, difficulty walking, and  decreased strength.   ACTIVITY LIMITATIONS: carrying, lifting, standing, squatting, transfers, and locomotion level  PARTICIPATION LIMITATIONS: cleaning, shopping, and community activity  PERSONAL FACTORS: Past/current experiences, Time since onset of injury/illness/exacerbation, and 3+ comorbidities: Osteoarthritis,  Parkinson's disease, history of CVA, peripheral neuropathy, hypertension, and history of cancer are also affecting patient's functional outcome.   REHAB POTENTIAL: Fair    CLINICAL DECISION MAKING: Evolving/moderate complexity  EVALUATION COMPLEXITY: Moderate  PLAN:  PT FREQUENCY: 2x/week  PT DURATION: 4 weeks  PLANNED INTERVENTIONS: 97110-Therapeutic exercises, 97530- Therapeutic activity, [id number]- Neuromuscular re-education, 97535- Self Care, [id number]- Manual therapy, and Patient/Family education  PLAN FOR NEXT SESSION: gait training, lower extremity strengthening, and balance interventions, PWR activities    Lacinda JAYSON Fass, PT 02/27/2024, 4:05 PM  "

## 2024-03-02 ENCOUNTER — Ambulatory Visit (HOSPITAL_COMMUNITY): Admitting: Speech Pathology

## 2024-03-02 ENCOUNTER — Ambulatory Visit (HOSPITAL_COMMUNITY)

## 2024-03-04 ENCOUNTER — Ambulatory Visit (INDEPENDENT_AMBULATORY_CARE_PROVIDER_SITE_OTHER): Admitting: Gastroenterology

## 2024-03-05 ENCOUNTER — Ambulatory Visit (HOSPITAL_COMMUNITY)

## 2024-03-10 ENCOUNTER — Ambulatory Visit (HOSPITAL_COMMUNITY)

## 2024-03-12 ENCOUNTER — Ambulatory Visit (HOSPITAL_COMMUNITY)

## 2024-03-23 ENCOUNTER — Ambulatory Visit (HOSPITAL_COMMUNITY): Admitting: Occupational Therapy

## 2024-06-15 ENCOUNTER — Ambulatory Visit: Admitting: Adult Health
